# Patient Record
Sex: Female | Born: 1990
Health system: Southern US, Community
[De-identification: ages and names within clinical notes are randomized; demographics above are authoritative.]

## PROBLEM LIST (undated history)

## (undated) DIAGNOSIS — G43909 Migraine, unspecified, not intractable, without status migrainosus: Secondary | ICD-10-CM

## (undated) DIAGNOSIS — R569 Unspecified convulsions: Secondary | ICD-10-CM

## (undated) DIAGNOSIS — F32A Depression, unspecified: Secondary | ICD-10-CM

## (undated) DIAGNOSIS — I10 Essential (primary) hypertension: Secondary | ICD-10-CM

## (undated) DIAGNOSIS — K859 Acute pancreatitis without necrosis or infection, unspecified: Secondary | ICD-10-CM

## (undated) DIAGNOSIS — Z8619 Personal history of other infectious and parasitic diseases: Secondary | ICD-10-CM

## (undated) HISTORY — DX: Personal history of other infectious and parasitic diseases: Z86.19

## (undated) HISTORY — PX: WISDOM TOOTH EXTRACTION: SHX21

## (undated) HISTORY — DX: Migraine, unspecified, not intractable, without status migrainosus: G43.909

---
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
## 2008-10-21 ENCOUNTER — Emergency Department (HOSPITAL_COMMUNITY): Admission: EM | Admit: 2008-10-21 | Discharge: 2008-10-21 | Payer: Self-pay | Admitting: Emergency Medicine

## 2009-02-27 ENCOUNTER — Emergency Department (HOSPITAL_COMMUNITY): Admission: EM | Admit: 2009-02-27 | Discharge: 2009-02-27 | Payer: Self-pay | Admitting: Emergency Medicine

## 2009-03-24 ENCOUNTER — Emergency Department (HOSPITAL_COMMUNITY): Admission: EM | Admit: 2009-03-24 | Discharge: 2009-03-24 | Payer: Self-pay | Admitting: Emergency Medicine

## 2009-09-17 ENCOUNTER — Emergency Department (HOSPITAL_COMMUNITY): Admission: EM | Admit: 2009-09-17 | Discharge: 2009-09-17 | Payer: Self-pay | Admitting: Emergency Medicine

## 2010-05-11 ENCOUNTER — Emergency Department (HOSPITAL_COMMUNITY): Admission: EM | Admit: 2010-05-11 | Discharge: 2010-05-11 | Payer: Self-pay | Admitting: Emergency Medicine

## 2010-10-02 ENCOUNTER — Inpatient Hospital Stay (INDEPENDENT_AMBULATORY_CARE_PROVIDER_SITE_OTHER)
Admission: RE | Admit: 2010-10-02 | Discharge: 2010-10-02 | Disposition: A | Discharge status: Home / Self Care | Payer: Self-pay | Source: Ambulatory Visit | Attending: Family Medicine | Admitting: Family Medicine

## 2010-10-02 DIAGNOSIS — J029 Acute pharyngitis, unspecified: Secondary | ICD-10-CM

## 2010-12-01 LAB — URINALYSIS, ROUTINE W REFLEX MICROSCOPIC
Glucose, UA: NEGATIVE mg/dL
Ketones, ur: NEGATIVE mg/dL
Protein, ur: NEGATIVE mg/dL
pH: 6.5 (ref 5.0–8.0)

## 2010-12-01 LAB — WET PREP, GENITAL
Clue Cells Wet Prep HPF POC: NONE SEEN
Trich, Wet Prep: NONE SEEN
Yeast Wet Prep HPF POC: NONE SEEN

## 2010-12-01 LAB — POCT PREGNANCY, URINE: Preg Test, Ur: NEGATIVE

## 2010-12-01 LAB — URINE MICROSCOPIC-ADD ON

## 2010-12-01 LAB — GC/CHLAMYDIA PROBE AMP, GENITAL
Chlamydia, DNA Probe: POSITIVE — AB
GC Probe Amp, Genital: POSITIVE — AB

## 2010-12-01 LAB — URINE CULTURE: Colony Count: >=100000

## 2010-12-10 LAB — URINALYSIS, ROUTINE W REFLEX MICROSCOPIC
Glucose, UA: NEGATIVE mg/dL
Ketones, ur: NEGATIVE mg/dL
Nitrite: NEGATIVE
Specific Gravity, Urine: 1.021 (ref 1.005–1.030)
pH: 7 (ref 5.0–8.0)

## 2010-12-10 LAB — URINE MICROSCOPIC-ADD ON

## 2011-09-26 ENCOUNTER — Encounter: Payer: Self-pay | Admitting: Obstetrics and Gynecology

## 2011-11-29 ENCOUNTER — Encounter (HOSPITAL_COMMUNITY): Payer: Self-pay | Admitting: *Deleted

## 2011-11-29 ENCOUNTER — Emergency Department (INDEPENDENT_AMBULATORY_CARE_PROVIDER_SITE_OTHER)
Admission: EM | Admit: 2011-11-29 | Discharge: 2011-11-29 | Disposition: A | Discharge status: Home / Self Care | Payer: Self-pay | Source: Home / Self Care | Attending: Family Medicine | Admitting: Family Medicine

## 2011-11-29 DIAGNOSIS — R109 Unspecified abdominal pain: Secondary | ICD-10-CM

## 2011-11-29 LAB — POCT URINALYSIS DIP (DEVICE)
Hgb urine dipstick: NEGATIVE
Ketones, ur: NEGATIVE mg/dL
Protein, ur: NEGATIVE mg/dL
Specific Gravity, Urine: 1.02 (ref 1.005–1.030)
Urobilinogen, UA: 0.2 mg/dL (ref 0.0–1.0)
pH: 7 (ref 5.0–8.0)

## 2011-11-29 MED ORDER — NAPROXEN 500 MG PO TABS: 500.0000 mg | ORAL_TABLET | Freq: Two times a day (BID) | ORAL | Status: DC

## 2011-11-29 MED ORDER — HYDROCODONE-ACETAMINOPHEN 5-325 MG PO TABS: ORAL_TABLET | ORAL | Status: AC

## 2011-11-29 NOTE — ED Provider Notes (Signed)
History     CSN: [id number]  Arrival date & time 11/29/11  1019   First MD Initiated Contact with Patient 11/29/11 1025      Chief Complaint  Patient presents with  . Abdominal Pain    (Consider location/radiation/quality/duration/timing/severity/associated sxs/prior treatment) HPI Comments: Donna Short presents for evaluation for abdominal cramps. She reports getting her annual physical and updated Depo-Provera shot on Wednesday. She was found to have Chlamydia and was given 2 pills, which she does not know what they were. She reports severe lower abdominal cramps and is unable to work. She denies any dysuria, hematuria, vaginal bleeding, or vaginal discharge. She does report mild blood, "pink", with wiping after urinating.  Patient is a 21 y.o. female presenting with cramps. The history is provided by the patient.  Abdominal Cramping The primary symptoms of the illness include abdominal pain. The primary symptoms of the illness do not include fever, fatigue, shortness of breath, nausea, vomiting, diarrhea, hematochezia, dysuria, vaginal discharge or vaginal bleeding. The current episode started 2 days ago. The onset of the illness was sudden. The problem has not changed since onset. The abdominal pain began 2 days ago. The pain came on suddenly. The abdominal pain has been unchanged since its onset. The abdominal pain is located in the suprapubic region. The abdominal pain does not radiate. The abdominal pain is relieved by nothing.  The patient states that she believes she is currently not pregnant. The patient has not had a change in bowel habit.    Past Medical History  Diagnosis Date  . Asthma     History reviewed. No pertinent past surgical history.  History reviewed. No pertinent family history.  History  Substance Use Topics  . Smoking status: Current Everyday Smoker  . Smokeless tobacco: Not on file  . Alcohol Use: Yes    OB History    Grav Para Term Preterm Abortions  TAB SAB Ect Mult Living                  Review of Systems  Constitutional: Negative.  Negative for fever and fatigue.  HENT: Negative.   Eyes: Negative.   Respiratory: Negative.  Negative for shortness of breath.   Cardiovascular: Negative.   Gastrointestinal: Positive for abdominal pain. Negative for nausea, vomiting, diarrhea and hematochezia.  Genitourinary: Negative.  Negative for dysuria, vaginal bleeding and vaginal discharge.  Musculoskeletal: Negative.   Skin: Negative.   Neurological: Negative.     Allergies  Review of patient's allergies indicates no known allergies.  Home Medications   Current Outpatient Rx  Name Route Sig Dispense Refill  . ALBUTEROL SULFATE HFA 108 (90 BASE) MCG/ACT IN AERS Inhalation Inhale 2 puffs into the lungs every 6 (six) hours as needed.    Marland Kitchen MEDROXYPROGESTERONE ACETATE 400 MG/ML IM SUSP Intramuscular Inject into the muscle once.    Marland Kitchen HYDROCODONE-ACETAMINOPHEN 5-325 MG PO TABS  Take one to two tablets every 4 to 6 hours as needed for pain 10 tablet 0  . NAPROXEN 500 MG PO TABS Oral Take 1 tablet (500 mg total) by mouth 2 (two) times daily with a meal. 30 tablet 0    BP 126/83  Pulse 94  Temp(Src) 98.9 F (37.2 C) (Oral)  Resp 20  SpO2 99%  Physical Exam  Nursing note and vitals reviewed. Constitutional: She is oriented to person, place, and time. She appears well-developed and well-nourished.  HENT:  Head: Normocephalic and atraumatic.  Eyes: EOM are normal.  Neck: Normal range of  motion.  Pulmonary/Chest: Effort normal.  Abdominal: Soft. Normal appearance and bowel sounds are normal. There is tenderness in the right lower quadrant, suprapubic area and left lower quadrant. There is no rebound, no guarding, no CVA tenderness, no tenderness at McBurney's point and negative Murphy's sign.  Musculoskeletal: Normal range of motion.  Neurological: She is alert and oriented to person, place, and time.  Skin: Skin is warm and dry.    Psychiatric: Her behavior is normal.    ED Course  Procedures (including critical care time)   Labs Reviewed  POCT URINALYSIS DIP (DEVICE)  POCT PREGNANCY, URINE   No results found.   1. Abdominal cramps       MDM  rx given for naproxen 500 mg BID WC, PRN hydrocodone        Renaee Munda, MD 11/29/11 1206

## 2011-11-29 NOTE — Discharge Instructions (Signed)
Take medications as directed, and as needed. Take the naproxen, with meals, for baseline pain control. Use the hydrocodone ONLY for pain not controlled by naproxen. Return to care should your symptoms not improve, or worsen in any way.

## 2011-11-29 NOTE — ED Notes (Signed)
Per pt seen by health department Wednesday physical pelvic exam - HIV and STD check  -  Positive  chlamydia took the two pills prescribed yesterday - now with abd cramping

## 2012-08-14 ENCOUNTER — Emergency Department (HOSPITAL_COMMUNITY): Payer: No Typology Code available for payment source

## 2012-08-14 ENCOUNTER — Encounter (HOSPITAL_COMMUNITY): Payer: Self-pay | Admitting: Family Medicine

## 2012-08-14 ENCOUNTER — Emergency Department (HOSPITAL_COMMUNITY)
Admission: EM | Admit: 2012-08-14 | Discharge: 2012-08-14 | Disposition: A | Discharge status: Home / Self Care | Payer: No Typology Code available for payment source | Attending: Emergency Medicine | Admitting: Emergency Medicine

## 2012-08-14 DIAGNOSIS — F17200 Nicotine dependence, unspecified, uncomplicated: Secondary | ICD-10-CM | POA: Insufficient documentation

## 2012-08-14 DIAGNOSIS — Y9289 Other specified places as the place of occurrence of the external cause: Secondary | ICD-10-CM | POA: Insufficient documentation

## 2012-08-14 DIAGNOSIS — S0993XA Unspecified injury of face, initial encounter: Secondary | ICD-10-CM | POA: Insufficient documentation

## 2012-08-14 DIAGNOSIS — IMO0002 Reserved for concepts with insufficient information to code with codable children: Secondary | ICD-10-CM | POA: Insufficient documentation

## 2012-08-14 DIAGNOSIS — Z79899 Other long term (current) drug therapy: Secondary | ICD-10-CM | POA: Insufficient documentation

## 2012-08-14 DIAGNOSIS — S161XXA Strain of muscle, fascia and tendon at neck level, initial encounter: Secondary | ICD-10-CM

## 2012-08-14 DIAGNOSIS — J45909 Unspecified asthma, uncomplicated: Secondary | ICD-10-CM | POA: Insufficient documentation

## 2012-08-14 DIAGNOSIS — Y9389 Activity, other specified: Secondary | ICD-10-CM | POA: Insufficient documentation

## 2012-08-14 MED ORDER — HYDROCODONE-ACETAMINOPHEN 5-325 MG PO TABS: 2.0000 | ORAL_TABLET | ORAL | Status: DC | PRN

## 2012-08-14 MED ORDER — IBUPROFEN 600 MG PO TABS: 600.0000 mg | ORAL_TABLET | Freq: Four times a day (QID) | ORAL | Status: DC | PRN

## 2012-08-14 MED ORDER — CYCLOBENZAPRINE HCL 10 MG PO TABS: 10.0000 mg | ORAL_TABLET | Freq: Two times a day (BID) | ORAL | Status: DC | PRN

## 2012-08-14 MED ORDER — HYDROCODONE-ACETAMINOPHEN 5-325 MG PO TABS
2.0000 | ORAL_TABLET | Freq: Once | ORAL | Status: AC
  Administered 2012-08-14: 2 via ORAL
  Filled 2012-08-14 (×2): qty 2
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
## 2012-10-15 ENCOUNTER — Emergency Department (HOSPITAL_COMMUNITY)
Admission: EM | Admit: 2012-10-15 | Discharge: 2012-10-15 | Disposition: A | Discharge status: Home / Self Care | Payer: No Typology Code available for payment source | Attending: Emergency Medicine | Admitting: Emergency Medicine

## 2012-10-15 ENCOUNTER — Encounter (HOSPITAL_COMMUNITY): Payer: Self-pay | Admitting: Emergency Medicine

## 2012-10-15 DIAGNOSIS — Z79899 Other long term (current) drug therapy: Secondary | ICD-10-CM | POA: Insufficient documentation

## 2012-10-15 DIAGNOSIS — S0003XA Contusion of scalp, initial encounter: Secondary | ICD-10-CM | POA: Insufficient documentation

## 2012-10-15 DIAGNOSIS — Y939 Activity, unspecified: Secondary | ICD-10-CM | POA: Insufficient documentation

## 2012-10-15 DIAGNOSIS — T07XXXA Unspecified multiple injuries, initial encounter: Secondary | ICD-10-CM

## 2012-10-15 DIAGNOSIS — W1809XA Striking against other object with subsequent fall, initial encounter: Secondary | ICD-10-CM | POA: Insufficient documentation

## 2012-10-15 DIAGNOSIS — Y92009 Unspecified place in unspecified non-institutional (private) residence as the place of occurrence of the external cause: Secondary | ICD-10-CM | POA: Insufficient documentation

## 2012-10-15 DIAGNOSIS — S00531A Contusion of lip, initial encounter: Secondary | ICD-10-CM

## 2012-10-15 DIAGNOSIS — J45909 Unspecified asthma, uncomplicated: Secondary | ICD-10-CM | POA: Insufficient documentation

## 2012-10-15 DIAGNOSIS — F17200 Nicotine dependence, unspecified, uncomplicated: Secondary | ICD-10-CM | POA: Insufficient documentation

## 2012-10-15 DIAGNOSIS — IMO0002 Reserved for concepts with insufficient information to code with codable children: Secondary | ICD-10-CM | POA: Insufficient documentation

## 2012-10-15 MED ORDER — ACETAMINOPHEN 325 MG PO TABS
650.0000 mg | ORAL_TABLET | Freq: Once | ORAL | Status: AC
  Administered 2012-10-15: 650 mg via ORAL
  Filled 2012-10-15: qty 2

## 2012-10-15 MED ORDER — ACETAMINOPHEN 500 MG PO TABS: 500.0000 mg | ORAL_TABLET | Freq: Four times a day (QID) | ORAL | Status: DC | PRN
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
## 2012-10-15 NOTE — ED Provider Notes (Signed)
Medical screening examination/treatment/procedure(s) were performed by non-physician practitioner and as supervising physician I was immediately available for consultation/collaboration.  Saavi Mceachron M Reshard Guillet, MD 10/15/12 1942 

## 2012-10-15 NOTE — ED Notes (Signed)
Pt discharged.vital signs stable and GCS 15

## 2012-10-15 NOTE — ED Notes (Signed)
Pt presented to ED with fall. As per pt she fell on her face and bruises on her upper lip and lt little finger.

## 2014-09-30 ENCOUNTER — Encounter (HOSPITAL_COMMUNITY): Payer: Self-pay | Admitting: *Deleted

## 2014-09-30 ENCOUNTER — Emergency Department (HOSPITAL_COMMUNITY)
Admission: EM | Admit: 2014-09-30 | Discharge: 2014-09-30 | Disposition: A | Discharge status: Home / Self Care | Payer: Managed Care, Other (non HMO) | Attending: Emergency Medicine | Admitting: Emergency Medicine

## 2014-09-30 DIAGNOSIS — Z79899 Other long term (current) drug therapy: Secondary | ICD-10-CM | POA: Insufficient documentation

## 2014-09-30 DIAGNOSIS — M79674 Pain in right toe(s): Secondary | ICD-10-CM | POA: Diagnosis not present

## 2014-09-30 DIAGNOSIS — J45909 Unspecified asthma, uncomplicated: Secondary | ICD-10-CM | POA: Diagnosis not present

## 2014-09-30 DIAGNOSIS — Z720 Tobacco use: Secondary | ICD-10-CM | POA: Insufficient documentation

## 2014-09-30 MED ORDER — KETOROLAC TROMETHAMINE 60 MG/2ML IM SOLN
60.0000 mg | Freq: Once | INTRAMUSCULAR | Status: AC
  Administered 2014-09-30: 60 mg via INTRAMUSCULAR
  Filled 2014-09-30: qty 2

## 2014-09-30 MED ORDER — NAPROXEN 500 MG PO TABS: 500.0000 mg | ORAL_TABLET | Freq: Two times a day (BID) | ORAL | Status: DC

## 2014-09-30 NOTE — ED Notes (Signed)
The pt uis c/o rt little toe pain for one week.  She works walking

## 2014-09-30 NOTE — Discharge Instructions (Signed)
You were evaluated in the ED today for your toe pain. There does not appear to be an emergent cause for your symptoms at this time. Your likely experiencing an overuse injury. It is important for you to take your medications as prescribed. The anti-inflammatories will help with your discomfort as well as the inflammation that could be contributing to your discomfort. It is also important for you to follow-up with primary care, [facility] and wellness for further evaluation and management of your symptoms. Return to ED for new or worsening symptoms.

## 2014-09-30 NOTE — ED Notes (Signed)
Pt given an ice pack to go home with.

## 2014-09-30 NOTE — ED Provider Notes (Signed)
CSN: [id number]     Arrival date & time 09/30/14  1911 History  This chart was scribed for Donna PeekBenjamin Kayveon Lennartz, PA-C working with No att. providers found by Elveria Risingimelie Horne, ED Scribe. This patient was seen in room TR05C/TR05C and the patient's care was started at 9:06 PM.   Chief Complaint  Patient presents with  . Toe Pain   The history is provided by the patient. No language interpreter was used.   HPI Comments: Donna Short is a 24 y.o. female who presents to the Emergency Department complaining of right fifth toe pain for three months. Patient reports intermittent swelling, discoloration and peeling. Patient reports soaking her foot in Epson salt; she has not taken any pain medication. Patient reports standing for long hours at work without taking breaks.  Patient reports 9/10 currently. Patient is ambulatory, but reports pain with walking. Denies numbness or weakness Patient denies additional medical complaints.  Past Medical History  Diagnosis Date  . Asthma    History reviewed. No pertinent past surgical history. No family history on file. History  Substance Use Topics  . Smoking status: Current Every Day Smoker  . Smokeless tobacco: Not on file  . Alcohol Use: Yes   OB History    No data available     Review of Systems  Constitutional: Negative for fever and chills.  Musculoskeletal: Positive for arthralgias.  Skin: Positive for color change.  Neurological: Negative for weakness and numbness.   Allergies  Shrimp  Home Medications   Prior to Admission medications   Medication Sig Start Date End Date Taking? Authorizing Provider  albuterol (PROVENTIL HFA;VENTOLIN HFA) 108 (90 BASE) MCG/ACT inhaler Inhale 2 puffs into the lungs every 6 (six) hours as needed.   Yes Historical Provider, MD  ibuprofen (ADVIL,MOTRIN) 800 MG tablet Take 800 mg by mouth every 8 (eight) hours as needed for moderate pain.   Yes Historical Provider, MD  medroxyPROGESTERone (DEPO-PROVERA) 150  MG/ML injection Inject 150 mg into the muscle every 3 (three) months. Last taken in August 16, 2014   Yes Historical Provider, MD  acetaminophen (TYLENOL) 500 MG tablet Take 1 tablet (500 mg total) by mouth every 6 (six) hours as needed for pain. Patient not taking: Reported on 09/30/2014 10/15/12   Fayrene HelperBowie Tran, PA-C  naproxen (NAPROSYN) 500 MG tablet Take 1 tablet (500 mg total) by mouth 2 (two) times daily. 09/30/14   Sharlene MottsBenjamin W Kaidin Boehle, PA-C   Triage Vitals: BP 161/120 mmHg  Pulse 131  Temp(Src) 98.8 F (37.1 C) (Oral)  Resp 18  SpO2 100% Physical Exam  Constitutional: She is oriented to person, place, and time. She appears well-developed and well-nourished. No distress.  HENT:  Head: Normocephalic and atraumatic.  Eyes: EOM are normal.  Neck: Neck supple. No tracheal deviation present.  Cardiovascular: Normal heart sounds.   Pulmonary/Chest: Effort normal. No respiratory distress.  Musculoskeletal: Normal range of motion.  Tenderness diffusely to right fifth toe. No overt erythema or warmth. Toe is mildly swollen. Cap refill less than 2 seconds. No evidence of abscess or cellulitis. No subungual hematomas or ecchymosis or other lesions.  Neurological: She is alert and oriented to person, place, and time.  Moves all 4 extremities without ataxia. Sensation intact  Skin: Skin is warm and dry.  No overt discoloration of right fifth toe  Psychiatric: She has a normal mood and affect. Her behavior is normal.  Nursing note and vitals reviewed.   ED Course  Procedures (including critical care time)  COORDINATION OF CARE: 9:16 PM- Discussed treatment plan with patient at bedside and patient agreed to plan.   Labs Review Labs Reviewed - No data to display  Imaging Review No results found.   EKG Interpretation None     Meds given in ED:  Medications  ketorolac (TORADOL) injection 60 mg (60 mg Intramuscular Given 09/30/14 2122)    Discharge Medication List as of 09/30/2014 10:43 PM     START taking these medications   Details  naproxen (NAPROSYN) 500 MG tablet Take 1 tablet (500 mg total) by mouth 2 (two) times daily., Starting 09/30/2014, Until Discontinued, Print       Filed Vitals:   09/30/14 1925 09/30/14 2233  BP: 161/120 144/103  Pulse: 131 105  Temp: 98.8 F (37.1 C) 98 F (36.7 C)  TempSrc: Oral Oral  Resp: 18 16  SpO2: 100% 100%    MDM  Vitals stable -  -afebrile, tachycardia and blood pressure improving in ED spontaneously and with administration of analgesia. Patient reports pain is improved with Toradol administration. Pt resting comfortably in ED. PE--not concerning further acute or emergent pathology  DDX--patient likely suffering from overuse injury as well as poor fitting footwear. Discussed wearing appropriate shoes, using anti-inflammatories, rest, elevation and ice therapy. Referral given to you [facility] and wellness in order to establish care with PCP. Will DC with naproxen I discussed all relevant lab findings and imaging results with pt and they verbalized understanding. Discussed f/u with PCP within 48 hrs and return precautions, pt very amenable to plan.  Final diagnoses:  Toe pain, right    I personally performed the services described in this documentation, which was scribed in my presence. The recorded information has been reviewed and is accurate.    Earle Gell [place], PA-C 10/01/14 0134  Arby Barrette, MD 10/01/14 [phone]

## 2014-11-25 ENCOUNTER — Encounter (HOSPITAL_COMMUNITY): Payer: Self-pay | Admitting: Adult Health

## 2014-11-25 ENCOUNTER — Emergency Department (HOSPITAL_COMMUNITY)
Admission: EM | Admit: 2014-11-25 | Discharge: 2014-11-25 | Disposition: A | Discharge status: Home / Self Care | Payer: Managed Care, Other (non HMO) | Attending: Emergency Medicine | Admitting: Emergency Medicine

## 2014-11-25 DIAGNOSIS — Z792 Long term (current) use of antibiotics: Secondary | ICD-10-CM | POA: Diagnosis not present

## 2014-11-25 DIAGNOSIS — K0889 Other specified disorders of teeth and supporting structures: Secondary | ICD-10-CM

## 2014-11-25 DIAGNOSIS — K029 Dental caries, unspecified: Secondary | ICD-10-CM | POA: Insufficient documentation

## 2014-11-25 DIAGNOSIS — Z720 Tobacco use: Secondary | ICD-10-CM | POA: Diagnosis not present

## 2014-11-25 DIAGNOSIS — Z791 Long term (current) use of non-steroidal anti-inflammatories (NSAID): Secondary | ICD-10-CM | POA: Insufficient documentation

## 2014-11-25 DIAGNOSIS — Z79899 Other long term (current) drug therapy: Secondary | ICD-10-CM | POA: Insufficient documentation

## 2014-11-25 DIAGNOSIS — J45909 Unspecified asthma, uncomplicated: Secondary | ICD-10-CM | POA: Insufficient documentation

## 2014-11-25 DIAGNOSIS — K088 Other specified disorders of teeth and supporting structures: Secondary | ICD-10-CM | POA: Insufficient documentation

## 2014-11-25 MED ORDER — BENZOCAINE 10 % MT GEL: 1.0000 "application " | OROMUCOSAL | Status: DC | PRN

## 2014-11-25 MED ORDER — LIDOCAINE VISCOUS 2 % MT SOLN: 20.0000 mL | OROMUCOSAL | Status: DC | PRN

## 2014-11-25 MED ORDER — CLINDAMYCIN HCL 150 MG PO CAPS: 300.0000 mg | ORAL_CAPSULE | Freq: Three times a day (TID) | ORAL | Status: DC

## 2014-11-25 MED ORDER — MELOXICAM 7.5 MG PO TABS: 15.0000 mg | ORAL_TABLET | Freq: Every day | ORAL | Status: DC

## 2014-11-25 MED ORDER — PENICILLIN V POTASSIUM 500 MG PO TABS: 500.0000 mg | ORAL_TABLET | Freq: Four times a day (QID) | ORAL | Status: DC

## 2014-11-25 MED ORDER — LIDOCAINE VISCOUS 2 % MT SOLN
15.0000 mL | Freq: Once | OROMUCOSAL | Status: AC
  Administered 2014-11-25: 15 mL via OROMUCOSAL
  Filled 2014-11-25: qty 15

## 2014-11-25 NOTE — Discharge Instructions (Signed)
Please follow up with your primary care physician in 1-2 days. If you do not have one please call the [facility] number listed above.Please follow up with Dr. Lucky CowboyKnox, the dentist, to schedule a follow up appointment.  Please take your antibiotic until completion. Please read all discharge instructions and return precautions.    Dental Pain A tooth ache may be caused by cavities (tooth decay). Cavities expose the nerve of the tooth to air and hot or cold temperatures. It may come from an infection or abscess (also called a boil or furuncle) around your tooth. It is also often caused by dental caries (tooth decay). This causes the pain you are having. DIAGNOSIS  Your caregiver can diagnose this problem by exam. TREATMENT   If caused by an infection, it may be treated with medications which kill germs (antibiotics) and pain medications as prescribed by your caregiver. Take medications as directed.  Only take over-the-counter or prescription medicines for pain, discomfort, or fever as directed by your caregiver.  Whether the tooth ache today is caused by infection or dental disease, you should see your dentist as soon as possible for further care. SEEK MEDICAL CARE IF: The exam and treatment you received today has been provided on an emergency basis only. This is not a substitute for complete medical or dental care. If your problem worsens or new problems (symptoms) appear, and you are unable to meet with your dentist, call or return to this location. SEEK IMMEDIATE MEDICAL CARE IF:   You have a fever.  You develop redness and swelling of your face, jaw, or neck.  You are unable to open your mouth.  You have severe pain uncontrolled by pain medicine. MAKE SURE YOU:   Understand these instructions.  Will watch your condition.  Will get help right away if you are not doing well or get worse. Document Released: 08/11/2005 Document Revised: 11/03/2011 Document Reviewed:  03/29/2008 [facility] Patient Information 2015 [employer] [place]. This information is not intended to replace advice given to you by your health care provider. Make sure you discuss any questions you have with your health care provider.

## 2014-11-25 NOTE — ED Notes (Signed)
Pt states she is allergic to penicillin, Candise BowensJen will write for another abx

## 2014-11-25 NOTE — ED Notes (Signed)
Presents with right lower jaw/gum pain began yesterday-pain is rated as 10/10. Pain is severe and prevents her from sleeping. Tried advil with no relief

## 2014-11-25 NOTE — ED Provider Notes (Signed)
CSN: [id number]     Arrival date & time 11/25/14  2129 History   First MD Initiated Contact with Patient 11/25/14 2204     Chief Complaint  Patient presents with  . Dental Pain     (Consider location/radiation/quality/duration/timing/severity/associated sxs/prior Treatment) HPI Comments: Patient is a 24 year old female past medical history significant for tobacco abuse presenting to the emergency department for right lower dental pain with radiation to jaw neck that began yesterday. Patient rates her pain as a 10/10. She tried taking 2 doses of 800mg  Advil last evening with no improvement. Patient has not tried any other medications. She does not have a dentist.   Patient is a 24 y.o. female presenting with tooth pain.  Dental Pain   Past Medical History  Diagnosis Date  . Asthma    History reviewed. No pertinent past surgical history. History reviewed. No pertinent family history. History  Substance Use Topics  . Smoking status: Current Every Day Smoker  . Smokeless tobacco: Not on file  . Alcohol Use: Yes   OB History    No data available     Review of Systems  HENT: Positive for dental problem.   All other systems reviewed and are negative.     Allergies  Shrimp  Home Medications   Prior to Admission medications   Medication Sig Start Date End Date Taking? Authorizing Provider  acetaminophen (TYLENOL) 500 MG tablet Take 1 tablet (500 mg total) by mouth every 6 (six) hours as needed for pain. Patient not taking: Reported on 09/30/2014 10/15/12   Fayrene HelperBowie Tran, PA-C  albuterol (PROVENTIL HFA;VENTOLIN HFA) 108 (90 BASE) MCG/ACT inhaler Inhale 2 puffs into the lungs every 6 (six) hours as needed.    Historical Provider, MD  benzocaine (ORAJEL) 10 % mucosal gel Use as directed 1 application in the mouth or throat as needed for mouth pain. 11/25/14   Kailee Essman, PA-C  clindamycin (CLEOCIN) 150 MG capsule Take 2 capsules (300 mg total) by mouth 3 (three) times daily.  May dispense as 150mg  capsules 11/25/14   Rabiah Goeser, PA-C  ibuprofen (ADVIL,MOTRIN) 800 MG tablet Take 800 mg by mouth every 8 (eight) hours as needed for moderate pain.    Historical Provider, MD  lidocaine (XYLOCAINE) 2 % solution Use as directed 20 mLs in the mouth or throat as needed for mouth pain. 11/25/14   Timmie Dugue, PA-C  medroxyPROGESTERone (DEPO-PROVERA) 150 MG/ML injection Inject 150 mg into the muscle every 3 (three) months. Last taken in August 16, 2014    Historical Provider, MD  meloxicam (MOBIC) 7.5 MG tablet Take 2 tablets (15 mg total) by mouth daily. 11/25/14   Eleanora Guinyard, PA-C  naproxen (NAPROSYN) 500 MG tablet Take 1 tablet (500 mg total) by mouth 2 (two) times daily. 09/30/14   Joycie PeekBenjamin Cartner, PA-C   BP 128/94 mmHg  Pulse 106  Temp(Src) 98.8 F (37.1 C) (Oral)  Resp 18  Ht 5\' 2"  (1.575 m)  Wt 126 lb (57.153 kg)  BMI 23.04 kg/m2  SpO2 98% Physical Exam  Constitutional: She is oriented to person, place, and time. She appears well-developed and well-nourished. No distress.  HENT:  Head: Normocephalic and atraumatic.  Right Ear: External ear normal.  Left Ear: External ear normal.  Nose: Nose normal.  Mouth/Throat: Uvula is midline, oropharynx is clear and moist and mucous membranes are normal. No trismus in the jaw. Abnormal dentition. Dental caries present. No uvula swelling.  Submental and sublingual spaces are soft.  Eyes: Conjunctivae  are normal.  Neck: Normal range of motion. Neck supple.  Cardiovascular: Normal rate, regular rhythm and normal heart sounds.   Pulmonary/Chest: Effort normal and breath sounds normal.  Abdominal: Soft.  Neurological: She is alert and oriented to person, place, and time.  Skin: Skin is warm and dry. She is not diaphoretic.  Psychiatric: She has a normal mood and affect.  Nursing note and vitals reviewed.   ED Course  Procedures (including critical care time) Medications  lidocaine (XYLOCAINE) 2 %  viscous mouth solution 15 mL (15 mLs Mouth/Throat Given 11/25/14 2231)    Labs Review Labs Reviewed - No data to display  Imaging Review No results found.   EKG Interpretation None      MDM   Final diagnoses:  Pain, dental    Filed Vitals:   11/25/14 2229  BP: 128/94  Pulse: 106  Temp: 98.8 F (37.1 C)  Resp: 18   Afebrile, NAD, non-toxic appearing, AAOx4.  Patient with toothache.  No gross abscess.  Exam unconcerning for Ludwig's angina or spread of infection.  Will treat with clindamycin (reported PCN allergy) and pain medicine.  Urged patient to follow-up with dentist.  Patient is stable at time of discharge      Francee Piccolo, PA-C 11/26/14 1610  Doug Sou, MD 11/27/14 [phone]

## 2015-03-13 ENCOUNTER — Other Ambulatory Visit (HOSPITAL_COMMUNITY)
Admission: RE | Admit: 2015-03-13 | Discharge: 2015-03-13 | Disposition: A | Discharge status: Home / Self Care | Payer: Managed Care, Other (non HMO) | Source: Ambulatory Visit | Attending: Emergency Medicine | Admitting: Emergency Medicine

## 2015-03-13 ENCOUNTER — Emergency Department (INDEPENDENT_AMBULATORY_CARE_PROVIDER_SITE_OTHER)
Admission: EM | Admit: 2015-03-13 | Discharge: 2015-03-13 | Disposition: A | Discharge status: Home / Self Care | Payer: Managed Care, Other (non HMO) | Source: Home / Self Care | Attending: Emergency Medicine | Admitting: Emergency Medicine

## 2015-03-13 ENCOUNTER — Encounter (HOSPITAL_COMMUNITY): Payer: Self-pay | Admitting: Emergency Medicine

## 2015-03-13 DIAGNOSIS — N72 Inflammatory disease of cervix uteri: Secondary | ICD-10-CM

## 2015-03-13 DIAGNOSIS — N730 Acute parametritis and pelvic cellulitis: Secondary | ICD-10-CM | POA: Diagnosis not present

## 2015-03-13 DIAGNOSIS — N949 Unspecified condition associated with female genital organs and menstrual cycle: Secondary | ICD-10-CM

## 2015-03-13 DIAGNOSIS — Z113 Encounter for screening for infections with a predominantly sexual mode of transmission: Secondary | ICD-10-CM | POA: Insufficient documentation

## 2015-03-13 DIAGNOSIS — N760 Acute vaginitis: Secondary | ICD-10-CM | POA: Insufficient documentation

## 2015-03-13 DIAGNOSIS — R102 Pelvic and perineal pain: Secondary | ICD-10-CM

## 2015-03-13 DIAGNOSIS — L292 Pruritus vulvae: Secondary | ICD-10-CM

## 2015-03-13 LAB — POCT URINALYSIS DIP (DEVICE)
Bilirubin Urine: NEGATIVE
Glucose, UA: NEGATIVE mg/dL
KETONES UR: NEGATIVE mg/dL
LEUKOCYTES UA: NEGATIVE
Nitrite: NEGATIVE
PH: 7 (ref 5.0–8.0)
Protein, ur: 100 mg/dL — AB
SPECIFIC GRAVITY, URINE: 1.015 (ref 1.005–1.030)
Urobilinogen, UA: 0.2 mg/dL (ref 0.0–1.0)

## 2015-03-13 LAB — POCT PREGNANCY, URINE: Preg Test, Ur: NEGATIVE

## 2015-03-13 MED ORDER — CEFTRIAXONE SODIUM 250 MG IJ SOLR
INTRAMUSCULAR | Status: AC
  Filled 2015-03-13: qty 250

## 2015-03-13 MED ORDER — CEFTRIAXONE SODIUM 250 MG IJ SOLR
250.0000 mg | Freq: Once | INTRAMUSCULAR | Status: AC
Start: 2015-03-13 — End: 2015-03-13
  Administered 2015-03-13: 250 mg via INTRAMUSCULAR

## 2015-03-13 MED ORDER — FLUCONAZOLE 150 MG PO TABS: ORAL_TABLET | ORAL | Status: DC

## 2015-03-13 MED ORDER — AZITHROMYCIN 250 MG PO TABS
1000.0000 mg | ORAL_TABLET | Freq: Every day | ORAL | Status: DC
  Administered 2015-03-13: 1000 mg via ORAL

## 2015-03-13 MED ORDER — AZITHROMYCIN 250 MG PO TABS
ORAL_TABLET | ORAL | Status: AC
  Filled 2015-03-13: qty 4

## 2015-03-13 NOTE — ED Notes (Signed)
Pt comes in with c/o frequent vaginal itch, more at night x 1 week States, she uses baby powder with showers and [employer] fragrances Last intercourse 6/17 Denies odor, discharge or pain  BP elevated- not on medication, denies chest pain Pt crying because mother is not present

## 2015-03-13 NOTE — Discharge Instructions (Signed)
Cervicitis Cervicitis is a soreness and puffiness (inflammation) of the cervix.  HOME CARE  Do not have sex (intercourse) until your doctor says it is okay.  Do not have sex until your partner is treated or as told by your doctor.  Take your antibiotic medicine as told. Finish it even if you start to feel better. GET HELP IF:   Your symptoms that brought you to the doctor come back.  You have a fever. MAKE SURE YOU:   Understand these instructions.  Will watch your condition.  Will get help right away if you are not doing well or get worse. Document Released: 05/20/2008 Document Revised: 08/16/2013 Document Reviewed: 02/02/2013 [facility] Patient Information 2015 [place], [place]. This information is not intended to replace advice given to you by your health care provider. Make sure you discuss any questions you have with your health care provider.  Pelvic Inflammatory Disease Pelvic inflammatory disease (PID) is an infection in some or all of the female organs. PID can be in the uterus, ovaries, fallopian tubes, or the surrounding tissues inside the lower belly area (pelvis). HOME CARE   If given, take your antibiotic medicine as told. Finish them even if you start to feel better.  Only take medicine as told by your doctor.  Do not have sex (intercourse) until treatment is done or as told by your doctor.  Tell your sex partner if you have PID. Your partner may need to be treated.  Keep all doctor visits. GET HELP RIGHT AWAY IF:   You have a fever.  You have more belly (abdominal) or lower belly pain.  You have chills.  You have pain when you pee (urinate).  You are not better after 72 hours.  You have more fluid (discharge) coming from your vagina or fluid that is not normal.  You need pain medicine from your doctor.  You throw up (vomit).  You cannot take your medicines.  Your partner has a sexually transmitted disease (STD). MAKE SURE YOU:   Understand  these instructions.  Will watch your condition.  Will get help right away if you are not doing well or get worse. Document Released: 11/07/2008 Document Revised: 12/06/2012 Document Reviewed: 08/07/2011 [facility] Patient Information 2015 [place], [place]. This information is not intended to replace advice given to you by your health care provider. Make sure you discuss any questions you have with your health care provider.  Pruritus  Pruritus is an itch. There are many different problems that can cause an itch. Dry skin is one of the most common causes of itching. Most cases of itching do not require medical attention.  HOME CARE INSTRUCTIONS  Make sure your skin is moistened on a regular basis. A moisturizer that contains petroleum jelly is best for keeping moisture in your skin. If you develop a rash, you may try the following for relief:   Use corticosteroid cream.  Apply cool compresses to the affected areas.  Bathe with Epsom salts or baking soda in the bathwater.  Soak in colloidal oatmeal baths. These are available at your pharmacy.  Apply baking soda paste to the rash. Stir water into baking soda until it reaches a paste-like consistency.  Use an anti-itch lotion.  Take over-the-counter diphenhydramine medicine by mouth as the instructions direct.  Avoid scratching. Scratching may cause the rash to become infected. If itching is very bad, your caregiver may suggest prescription lotions or creams to lessen your symptoms.  Avoid hot showers, which can make itching worse.  A cold shower may help with itching as long as you use a moisturizer after the shower. SEEK MEDICAL CARE IF: The itching does not go away after several days. Document Released: 04/23/2011 Document Revised: 12/26/2013 Document Reviewed: 04/23/2011 [facility] Patient Information 2015 [place]. This information is not intended to replace advice given to you by your health care provider. Make sure you discuss  any questions you have with your health care provider.

## 2015-03-13 NOTE — ED Provider Notes (Signed)
CSN: [id number]     Arrival date & time 03/13/15  1445 History   First MD Initiated Contact with Patient 03/13/15 1533     Chief Complaint  Patient presents with  . Vaginal Itching   (Consider location/radiation/quality/duration/timing/severity/associated sxs/prior Treatment) HPI Comments: 24 year old female complaining of vaginal itching for 2 weeks. She denies vaginal discharge or odor. Denies pelvic pain. States that she had been using a [employer] fragrances body wash but does not use it to a genitals.   Past Medical History  Diagnosis Date  . Asthma    History reviewed. No pertinent past surgical history. No family history on file. History  Substance Use Topics  . Smoking status: Current Every Day Smoker  . Smokeless tobacco: Not on file  . Alcohol Use: Yes   OB History    No data available     Review of Systems  Constitutional: Negative for fever, activity change and fatigue.  HENT: Negative.   Respiratory: Negative.   Gastrointestinal: Negative.   Genitourinary: Negative for dysuria, urgency, frequency, flank pain, menstrual problem and pelvic pain.  Skin: Negative for rash.    Allergies  Shrimp  Home Medications   Prior to Admission medications   Medication Sig Start Date End Date Taking? Authorizing Provider  acetaminophen (TYLENOL) 500 MG tablet Take 1 tablet (500 mg total) by mouth every 6 (six) hours as needed for pain. Patient not taking: Reported on 09/30/2014 10/15/12   Fayrene Helper, PA-C  albuterol (PROVENTIL HFA;VENTOLIN HFA) 108 (90 BASE) MCG/ACT inhaler Inhale 2 puffs into the lungs every 6 (six) hours as needed.    Historical Provider, MD  benzocaine (ORAJEL) 10 % mucosal gel Use as directed 1 application in the mouth or throat as needed for mouth pain. 11/25/14   Jennifer Piepenbrink, PA-C  clindamycin (CLEOCIN) 150 MG capsule Take 2 capsules (300 mg total) by mouth 3 (three) times daily. May dispense as  capsules 11/25/14   Jennifer Piepenbrink,  PA-C  fluconazole (DIFLUCAN) 150 MG tablet 1 tab po x 1. May repeat in 72 hours if no improvement 03/13/15   Hayden Rasmussen, NP  ibuprofen (ADVIL,MOTRIN) 800 MG tablet Take 800 mg by mouth every 8 (eight) hours as needed for moderate pain.    Historical Provider, MD  lidocaine (XYLOCAINE) 2 % solution Use as directed 20 mLs in the mouth or throat as needed for mouth pain. 11/25/14   Jennifer Piepenbrink, PA-C  medroxyPROGESTERone (DEPO-PROVERA) 150 MG/ML injection Inject 150 mg into the muscle every 3 (three) months. Last taken in August 16, 2014    Historical Provider, MD  meloxicam (MOBIC) 7.5 MG tablet Take 2 tablets (15 mg total) by mouth daily. 11/25/14   Jennifer Piepenbrink, PA-C  naproxen (NAPROSYN) 500 MG tablet Take 1 tablet (500 mg total) by mouth 2 (two) times daily. 09/30/14   Joycie Peek, PA-C   BP 151/111 mmHg  Pulse 123  Temp(Src) 98.7 F (37.1 C) (Oral)  Resp 18  SpO2 100% Physical Exam  Constitutional: She is oriented to person, place, and time. She appears well-developed and well-nourished.  Neck: Normal range of motion. Neck supple.  Pulmonary/Chest: Effort normal.  Genitourinary:  Normal external female genitalia. The patient points to the mucosal aspect of the labia majora as a site of itching. There is no overt erythema there nor are there abnormal lesions. After insertion of the speculum there is scant amount of white discharge. The ectocervix is just right of midline. No lesions are observed. Bimanual: There is marked  severe CMT and bilateral adnexal tenderness. The patient began crying with manipulation of the structures.  Musculoskeletal: She exhibits no edema.  Neurological: She is alert and oriented to person, place, and time.  Skin: Skin is warm and dry.  Psychiatric: She has a normal mood and affect.  Nursing note and vitals reviewed.   ED Course  Procedures (including critical care time) Labs Review Labs Reviewed  POCT URINALYSIS DIP (DEVICE) - Abnormal;  Notable for the following:    Hgb urine dipstick TRACE (*)    Protein, ur 100 (*)    All other components within normal limits  POCT PREGNANCY, URINE  CERVICOVAGINAL ANCILLARY ONLY   Results for orders placed or performed during the hospital encounter of 03/13/15  POCT urinalysis dip (device)  Result Value Ref Range   Glucose, UA NEGATIVE NEGATIVE mg/dL   Bilirubin Urine NEGATIVE NEGATIVE   Ketones, ur NEGATIVE NEGATIVE mg/dL   Specific Gravity, Urine 1.015 1.005 - 1.030   Hgb urine dipstick TRACE (A) NEGATIVE   pH 7.0 5.0 - 8.0   Protein, ur 100 (A) NEGATIVE mg/dL   Urobilinogen, UA 0.2 0.0 - 1.0 mg/dL   Nitrite NEGATIVE NEGATIVE   Leukocytes, UA NEGATIVE NEGATIVE  Pregnancy, urine POC  Result Value Ref Range   Preg Test, Ur NEGATIVE NEGATIVE    Imaging Review No results found.   MDM   1. Vulvar itching   2. Adnexal tenderness   3. Cervicitis   4. PID (acute pelvic inflammatory disease)    Use monistat cream to the external areas of itching for 1 week.  May also add hydrocortisone cream 1% 1-2 times a day to same area along with the monistat cream. This itching may be due to contact dermatitis as a second differential. Diflucan 150 mg one now and repeat in 2 days Rocephin 250 mg IM now Azithromycin 1 g by mouth now Cervical Cytology pending    Hayden Rasmussenavid Crawford Tamura, NP 03/13/15 1625

## 2015-03-14 LAB — CERVICOVAGINAL ANCILLARY ONLY
Chlamydia: NEGATIVE
NEISSERIA GONORRHEA: NEGATIVE

## 2015-03-15 LAB — CERVICOVAGINAL ANCILLARY ONLY: WET PREP (BD AFFIRM): NEGATIVE

## 2015-05-02 ENCOUNTER — Emergency Department (HOSPITAL_COMMUNITY)
Admission: EM | Admit: 2015-05-02 | Discharge: 2015-05-03 | Disposition: A | Discharge status: Home / Self Care | Payer: Managed Care, Other (non HMO) | Attending: Emergency Medicine | Admitting: Emergency Medicine

## 2015-05-02 ENCOUNTER — Encounter (HOSPITAL_COMMUNITY): Payer: Self-pay | Admitting: *Deleted

## 2015-05-02 ENCOUNTER — Emergency Department (HOSPITAL_COMMUNITY): Payer: Managed Care, Other (non HMO)

## 2015-05-02 DIAGNOSIS — Y998 Other external cause status: Secondary | ICD-10-CM | POA: Insufficient documentation

## 2015-05-02 DIAGNOSIS — J45901 Unspecified asthma with (acute) exacerbation: Secondary | ICD-10-CM | POA: Insufficient documentation

## 2015-05-02 DIAGNOSIS — J45909 Unspecified asthma, uncomplicated: Secondary | ICD-10-CM

## 2015-05-02 DIAGNOSIS — S199XXA Unspecified injury of neck, initial encounter: Secondary | ICD-10-CM | POA: Diagnosis present

## 2015-05-02 DIAGNOSIS — Z91040 Latex allergy status: Secondary | ICD-10-CM | POA: Diagnosis not present

## 2015-05-02 DIAGNOSIS — Z79899 Other long term (current) drug therapy: Secondary | ICD-10-CM | POA: Insufficient documentation

## 2015-05-02 DIAGNOSIS — Z720 Tobacco use: Secondary | ICD-10-CM | POA: Insufficient documentation

## 2015-05-02 DIAGNOSIS — Y9389 Activity, other specified: Secondary | ICD-10-CM | POA: Diagnosis not present

## 2015-05-02 DIAGNOSIS — R55 Syncope and collapse: Secondary | ICD-10-CM | POA: Insufficient documentation

## 2015-05-02 DIAGNOSIS — W19XXXA Unspecified fall, initial encounter: Secondary | ICD-10-CM

## 2015-05-02 DIAGNOSIS — W01198A Fall on same level from slipping, tripping and stumbling with subsequent striking against other object, initial encounter: Secondary | ICD-10-CM | POA: Diagnosis not present

## 2015-05-02 DIAGNOSIS — Y9289 Other specified places as the place of occurrence of the external cause: Secondary | ICD-10-CM | POA: Diagnosis not present

## 2015-05-02 DIAGNOSIS — S161XXA Strain of muscle, fascia and tendon at neck level, initial encounter: Secondary | ICD-10-CM | POA: Insufficient documentation

## 2015-05-02 DIAGNOSIS — F410 Panic disorder [episodic paroxysmal anxiety] without agoraphobia: Secondary | ICD-10-CM | POA: Diagnosis not present

## 2015-05-02 LAB — CBC WITH DIFFERENTIAL/PLATELET
BASOS ABS: 0 10*3/uL (ref 0.0–0.1)
BASOS PCT: 1 % (ref 0–1)
Eosinophils Absolute: 0 10*3/uL (ref 0.0–0.7)
Eosinophils Relative: 0 % (ref 0–5)
HEMATOCRIT: 40.6 % (ref 36.0–46.0)
Hemoglobin: 14 g/dL (ref 12.0–15.0)
Lymphocytes Relative: 40 % (ref 12–46)
Lymphs Abs: 2.5 10*3/uL (ref 0.7–4.0)
MCH: 34.8 pg — ABNORMAL HIGH (ref 26.0–34.0)
MCHC: 34.5 g/dL (ref 30.0–36.0)
MCV: 101 fL — ABNORMAL HIGH (ref 78.0–100.0)
MONO ABS: 0.5 10*3/uL (ref 0.1–1.0)
Monocytes Relative: 8 % (ref 3–12)
NEUTROS ABS: 3.2 10*3/uL (ref 1.7–7.7)
Neutrophils Relative %: 51 % (ref 43–77)
Platelets: 172 10*3/uL (ref 150–400)
RBC: 4.02 MIL/uL (ref 3.87–5.11)
RDW: 12.4 % (ref 11.5–15.5)
WBC: 6.1 10*3/uL (ref 4.0–10.5)

## 2015-05-02 LAB — I-STAT BETA HCG BLOOD, ED (MC, WL, AP ONLY): I-stat hCG, quantitative: <5 m[IU]/mL (ref <5)

## 2015-05-02 LAB — BASIC METABOLIC PANEL
ANION GAP: 22 — AB (ref 5–15)
BUN: 8 mg/dL (ref 6–20)
CALCIUM: 9.1 mg/dL (ref 8.9–10.3)
CO2: 18 mmol/L — AB (ref 22–32)
Chloride: 100 mmol/L — ABNORMAL LOW (ref 101–111)
Creatinine, Ser: 0.58 mg/dL (ref 0.44–1.00)
GFR calc non Af Amer: >60 mL/min (ref >60)
Glucose, Bld: 74 mg/dL (ref 65–99)
Potassium: 3.1 mmol/L — ABNORMAL LOW (ref 3.5–5.1)
Sodium: 140 mmol/L (ref 135–145)

## 2015-05-02 NOTE — ED Provider Notes (Signed)
CSN: [id number]     Arrival date & time 05/02/15  2144 History   First MD Initiated Contact with Patient 05/02/15 2202     Chief Complaint  Patient presents with  . Asthma  . Panic Attack  . Fall     (Consider location/radiation/quality/duration/timing/severity/associated sxs/prior Treatment) HPI Comments: Patient is a 24 year old female with a past medical history of asthma who presents with SOB that started prior to arrival. Patient reports having an emotional conversation with someone prior to arrival which seemed to trigger her SOB. She denies any chest pain or any other associated symptoms. No alleviating factors. When patient arrived in the ED, she presented to the desk and had a syncopal episode. She hit the back of her head on the floor and also complains of aching, non-radiating neck pain. Movement makes the pain worse. No alleviating factors. She continues to have SOB. No recent travel/surgery/immobilization, no exogenous estrogen, no previous DVT/PE.    Past Medical History  Diagnosis Date  . Asthma    History reviewed. No pertinent past surgical history. History reviewed. No pertinent family history. Social History  Substance Use Topics  . Smoking status: Current Every Day Smoker  . Smokeless tobacco: None  . Alcohol Use: Yes   OB History    No data available     Review of Systems  Respiratory: Positive for shortness of breath.   Neurological: Positive for syncope.  All other systems reviewed and are negative.     Allergies  Benadryl; Latex; and Shrimp  Home Medications   Prior to Admission medications   Medication Sig Start Date End Date Taking? Authorizing Provider  acetaminophen (TYLENOL) 500 MG tablet Take 1 tablet (500 mg total) by mouth every 6 (six) hours as needed for pain. Patient not taking: Reported on 09/30/2014 10/15/12   Fayrene Helper, PA-C  albuterol (PROVENTIL HFA;VENTOLIN HFA) 108 (90 BASE) MCG/ACT inhaler Inhale 2 puffs into the lungs every 6  (six) hours as needed.    Historical Provider, MD  benzocaine (ORAJEL) 10 % mucosal gel Use as directed 1 application in the mouth or throat as needed for mouth pain. Patient not taking: Reported on 05/02/2015 11/25/14   Francee Piccolo, PA-C  clindamycin (CLEOCIN) 150 MG capsule Take 2 capsules (300 mg total) by mouth 3 (three) times daily. May dispense as 150mg  capsules Patient not taking: Reported on 05/02/2015 11/25/14   Francee Piccolo, PA-C  fluconazole (DIFLUCAN) 150 MG tablet 1 tab po x 1. May repeat in 72 hours if no improvement Patient not taking: Reported on 05/02/2015 03/13/15   Hayden Rasmussen, NP  lidocaine (XYLOCAINE) 2 % solution Use as directed 20 mLs in the mouth or throat as needed for mouth pain. Patient not taking: Reported on 05/02/2015 11/25/14   Francee Piccolo, PA-C  medroxyPROGESTERone (DEPO-PROVERA) 150 MG/ML injection Inject 150 mg into the muscle every 3 (three) months. Last taken in August 16, 2014    Historical Provider, MD  meloxicam (MOBIC) 7.5 MG tablet Take 2 tablets (15 mg total) by mouth daily. Patient not taking: Reported on 05/02/2015 11/25/14   Francee Piccolo, PA-C  naproxen (NAPROSYN) 500 MG tablet Take 1 tablet (500 mg total) by mouth 2 (two) times daily. Patient not taking: Reported on 05/02/2015 09/30/14   Joycie Peek, PA-C   BP 168/120 mmHg  Pulse 127  Temp(Src) 98.8 F (37.1 C) (Oral)  Resp 18  Ht 5\' 2"  (1.575 m)  Wt 120 lb (54.432 kg)  BMI 21.94 kg/m2  SpO2  96% Physical Exam  Constitutional: She is oriented to person, place, and time. She appears well-developed and well-nourished. No distress.  HENT:  Head: Normocephalic and atraumatic.  Occipital scalp tenderness to palpation.   Eyes: Conjunctivae and EOM are normal. Pupils are equal, round, and reactive to light.  Neck:  c-collar in place  Cardiovascular: Regular rhythm.  Exam reveals no gallop and no friction rub.   No murmur heard. Tachycardic. No lower extremity swelling or calf  tenderness to palpation.   Pulmonary/Chest: Effort normal and breath sounds normal. She has no wheezes. She has no rales. She exhibits no tenderness.  Abdominal: Soft. She exhibits no distension. There is no tenderness. There is no rebound.  Musculoskeletal: Normal range of motion.  c-collar in place. No other midline spine tenderness to palpation.   Neurological: She is alert and oriented to person, place, and time. Coordination normal.  Speech is goal-oriented. Moves limbs without ataxia.   Skin: Skin is warm and dry.  Psychiatric: She has a normal mood and affect. Her behavior is normal.  Nursing note and vitals reviewed.   ED Course  Procedures (including critical care time) Labs Review Labs Reviewed  CBC WITH DIFFERENTIAL/PLATELET - Abnormal; Notable for the following:    MCV 101.0 (*)    MCH 34.8 (*)    All other components within normal limits  BASIC METABOLIC PANEL - Abnormal; Notable for the following:    Potassium 3.1 (*)    Chloride 100 (*)    CO2 18 (*)    Anion gap 22 (*)    All other components within normal limits  URINALYSIS, ROUTINE W REFLEX MICROSCOPIC (NOT AT [facility]) - Abnormal; Notable for the following:    APPearance CLOUDY (*)    Hgb urine dipstick SMALL (*)    Ketones, ur 40 (*)    Protein, ur >300 (*)    All other components within normal limits  URINE MICROSCOPIC-ADD ON - Abnormal; Notable for the following:    Squamous Epithelial / LPF FEW (*)    Bacteria, UA MANY (*)    All other components within normal limits  D-DIMER, QUANTITATIVE (NOT AT [facility])  I-STAT BETA HCG BLOOD, ED ([facility], [facility], [facility] ONLY)    Imaging Review Ct Head Wo Contrast  05/03/2015   CLINICAL DATA:  Neck pain after a fall. Difficulty breathing. Posterior head pain.  EXAM: CT HEAD WITHOUT CONTRAST  CT CERVICAL SPINE WITHOUT CONTRAST  TECHNIQUE: Multidetector CT imaging of the head and cervical spine was performed following the standard protocol without intravenous contrast. Multiplanar CT  image reconstructions of the cervical spine were also generated.  COMPARISON:  Cervical spine radiographs 08/14/2012  FINDINGS: CT HEAD FINDINGS  Ventricles and sulci are symmetrical. No mass effect or midline shift. No abnormal extra-axial fluid collections. Gray-white matter junctions are distinct. Basal cisterns are not effaced. No evidence of acute intracranial hemorrhage. No depressed skull fractures. Visualized paranasal sinuses and mastoid air cells are not opacified.  CT CERVICAL SPINE FINDINGS  Straightening of the usual cervical lordosis. This may be due to patient positioning but ligamentous injury or muscle spasm could also have this appearance and are not excluded. No anterior subluxation. Normal alignment of the facet joints. No prevertebral soft tissue swelling. No focal bone lesion or bone destruction. No vertebral compression deformities. Intervertebral disc space heights are preserved. C1-2 articulation appears intact.  IMPRESSION: No acute intracranial abnormalities.  Nonspecific straightening of the usual cervical lordosis. No acute displaced fractures identified.   Electronically Signed  By: Burman Nieves M.D.   On: 05/03/2015 00:17   Ct Cervical Spine Wo Contrast  05/03/2015   CLINICAL DATA:  Neck pain after a fall. Difficulty breathing. Posterior head pain.  EXAM: CT HEAD WITHOUT CONTRAST  CT CERVICAL SPINE WITHOUT CONTRAST  TECHNIQUE: Multidetector CT imaging of the head and cervical spine was performed following the standard protocol without intravenous contrast. Multiplanar CT image reconstructions of the cervical spine were also generated.  COMPARISON:  Cervical spine radiographs 08/14/2012  FINDINGS: CT HEAD FINDINGS  Ventricles and sulci are symmetrical. No mass effect or midline shift. No abnormal extra-axial fluid collections. Gray-white matter junctions are distinct. Basal cisterns are not effaced. No evidence of acute intracranial hemorrhage. No depressed skull fractures.  Visualized paranasal sinuses and mastoid air cells are not opacified.  CT CERVICAL SPINE FINDINGS  Straightening of the usual cervical lordosis. This may be due to patient positioning but ligamentous injury or muscle spasm could also have this appearance and are not excluded. No anterior subluxation. Normal alignment of the facet joints. No prevertebral soft tissue swelling. No focal bone lesion or bone destruction. No vertebral compression deformities. Intervertebral disc space heights are preserved. C1-2 articulation appears intact.  IMPRESSION: No acute intracranial abnormalities.  Nonspecific straightening of the usual cervical lordosis. No acute displaced fractures identified.   Electronically Signed   By: Burman Nieves M.D.   On: 05/03/2015 00:17   Dg Chest Port 1 View  05/02/2015   CLINICAL DATA:  24 year old female with shortness of breath and fall.  EXAM: PORTABLE CHEST - 1 VIEW  COMPARISON:  Radiograph dated 09/17/2009  FINDINGS: The heart size and mediastinal contours are within normal limits. Both lungs are clear. There is dextroscoliosis of the thoracic spine. No acute fracture.  IMPRESSION: No acute cardiopulmonary process.   Electronically Signed   By: Elgie Collard M.D.   On: 05/02/2015 23:24   I have personally reviewed and evaluated these images and lab results as part of my medical decision-making.   EKG Interpretation None      MDM   Final diagnoses:  Anxiety attack  Fall, initial encounter  Cervical strain, acute, initial encounter    11:18 PM Imaging and labs pending. Patient is tachycardic at this time. Remaining vitals stable. Patient not wheezing.   1:13 AM Patient's CT head, cervical spine, and chest xray unremarkable for acute changes. Patient's heart rate has improved since being in the ED. D-dimer pending.   Patient feeling better and heart rate has improved. D-dimer negative. Patient likely having anxiety attack and has improved. Patient will be discharged  with mobic and flexeril for pain.    Emilia Beck, PA-C 05/03/15 2045  Tilden Fossa, MD 05/04/15 [phone]

## 2015-05-02 NOTE — ED Notes (Addendum)
While checking in, pt fell from a standing positon striking head on floor.  Denies neck pain.  Denies LOC.  States she tripped and fell.  While immobilizing pt she was noted to be hyperventilating.  Pt now calm.  Pt is fully immobilized.

## 2015-05-02 NOTE — ED Notes (Addendum)
Pt to ED from home c/o difficulty breathing. Reports having a difficult time breathing; denies taking medications for asthma. Pt also fell in triage, presents with c-collar and LSB from staff; c/o posterior head pain, states "I'm not sure why I fell out up there." A&O x4

## 2015-05-03 LAB — URINALYSIS, ROUTINE W REFLEX MICROSCOPIC
Bilirubin Urine: NEGATIVE
Glucose, UA: NEGATIVE mg/dL
Ketones, ur: 40 mg/dL — AB
LEUKOCYTES UA: NEGATIVE
NITRITE: NEGATIVE
Protein, ur: >300 mg/dL — AB
SPECIFIC GRAVITY, URINE: 1.022 (ref 1.005–1.030)
Urobilinogen, UA: 1 mg/dL (ref 0.0–1.0)
pH: 6 (ref 5.0–8.0)

## 2015-05-03 LAB — D-DIMER, QUANTITATIVE (NOT AT ARMC): D DIMER QUANT: 0.35 ug{FEU}/mL (ref 0.00–0.48)

## 2015-05-03 LAB — URINE MICROSCOPIC-ADD ON

## 2015-05-03 MED ORDER — MELOXICAM 15 MG PO TABS: 15.0000 mg | ORAL_TABLET | Freq: Every day | ORAL | Status: DC

## 2015-05-03 MED ORDER — CYCLOBENZAPRINE HCL 10 MG PO TABS: 10.0000 mg | ORAL_TABLET | Freq: Two times a day (BID) | ORAL | Status: DC | PRN

## 2015-05-03 NOTE — ED Notes (Signed)
Pt stable, ambulatory, states understanding of discharge instructions 

## 2015-05-03 NOTE — Discharge Instructions (Signed)
Take mobic as needed for pain. Take flexeril as needed for muscle spasm. Refer to attached documents for more information.  °

## 2015-10-07 ENCOUNTER — Emergency Department (INDEPENDENT_AMBULATORY_CARE_PROVIDER_SITE_OTHER)
Admission: EM | Admit: 2015-10-07 | Discharge: 2015-10-07 | Disposition: A | Discharge status: Home / Self Care | Payer: Managed Care, Other (non HMO) | Source: Home / Self Care | Attending: Family Medicine | Admitting: Family Medicine

## 2015-10-07 ENCOUNTER — Other Ambulatory Visit (HOSPITAL_COMMUNITY)
Admission: RE | Admit: 2015-10-07 | Discharge: 2015-10-07 | Disposition: A | Discharge status: Home / Self Care | Payer: Managed Care, Other (non HMO) | Source: Ambulatory Visit | Attending: Family Medicine | Admitting: Family Medicine

## 2015-10-07 ENCOUNTER — Encounter (HOSPITAL_COMMUNITY): Payer: Self-pay | Admitting: Emergency Medicine

## 2015-10-07 DIAGNOSIS — R112 Nausea with vomiting, unspecified: Secondary | ICD-10-CM

## 2015-10-07 DIAGNOSIS — R030 Elevated blood-pressure reading, without diagnosis of hypertension: Secondary | ICD-10-CM

## 2015-10-07 DIAGNOSIS — N390 Urinary tract infection, site not specified: Secondary | ICD-10-CM

## 2015-10-07 DIAGNOSIS — IMO0001 Reserved for inherently not codable concepts without codable children: Secondary | ICD-10-CM

## 2015-10-07 LAB — POCT URINALYSIS DIP (DEVICE)
Bilirubin Urine: NEGATIVE
Glucose, UA: NEGATIVE mg/dL
Ketones, ur: 80 mg/dL — AB
LEUKOCYTES UA: NEGATIVE
NITRITE: NEGATIVE
PH: 6.5 (ref 5.0–8.0)
Protein, ur: >=300 mg/dL — AB
Specific Gravity, Urine: >=1.03 (ref 1.005–1.030)
UROBILINOGEN UA: 0.2 mg/dL (ref 0.0–1.0)

## 2015-10-07 LAB — POCT PREGNANCY, URINE: PREG TEST UR: NEGATIVE

## 2015-10-07 MED ORDER — ONDANSETRON HCL 4 MG PO TABS: 4.0000 mg | ORAL_TABLET | Freq: Three times a day (TID) | ORAL | Status: DC | PRN

## 2015-10-07 MED ORDER — NITROFURANTOIN MONOHYD MACRO 100 MG PO CAPS: 100.0000 mg | ORAL_CAPSULE | Freq: Two times a day (BID) | ORAL | Status: DC

## 2015-10-07 NOTE — ED Notes (Signed)
Pt here with sudden nausea, vomiting and diarrhea that started yesterday States , her power in the house went out for 5 hours when she ate fridge salad and chicken Tachycardic with elevated BP 164/124 Denies chest pain or sob Urine sample obtained

## 2015-10-07 NOTE — Discharge Instructions (Signed)
Urinary Tract Infection A urinary tract infection (UTI) can occur any place along the urinary tract. The tract includes the kidneys, ureters, bladder, and urethra. A type of germ called bacteria often causes a UTI. UTIs are often helped with antibiotic medicine.  HOME CARE   If given, take antibiotics as told by your doctor. Finish them even if you start to feel better.  Drink enough fluids to keep your pee (urine) clear or pale yellow.  Avoid tea, drinks with caffeine, and bubbly (carbonated) drinks.  Pee often. Avoid holding your pee in for a long time.  Pee before and after having sex (intercourse).  Wipe from front to back after you poop (bowel movement) if you are a woman. Use each tissue only once. GET HELP RIGHT AWAY IF:   You have back pain.  You have lower belly (abdominal) pain.  You have chills.  You feel sick to your stomach (nauseous).  You throw up (vomit).  Your burning or discomfort with peeing does not go away.  You have a fever.  Your symptoms are not better in 3 days. MAKE SURE YOU:   Understand these instructions.  Will watch your condition.  Will get help right away if you are not doing well or get worse.   This information is not intended to replace advice given to you by your health care provider. Make sure you discuss any questions you have with your health care provider.   You have a urinary tract infection. Please take all of your antibiotics. If you worsen please f/u her or in the ER. Your blood pressure is a little hight today. Please f/u with your primary care for this. Push fluids, watch salt and alcohol intake.    Document Released: 01/28/2008 Document Revised: 09/01/2014 Document Reviewed: 03/11/2012 Elsevier Interactive Patient Education [employer]

## 2015-10-07 NOTE — ED Provider Notes (Signed)
CSN: [id number]     Arrival date & time 10/07/15  1902 History   First MD Initiated Contact with Patient 10/07/15 1944     Chief Complaint  Patient presents with  . GI Problem  . Nausea   (Consider location/radiation/quality/duration/timing/severity/associated sxs/prior Treatment) HPI Comments: Patient is 25 yo with lower abdominal discomfort. Started yesterday with nausea and vomiting. Some diarrhea today. No fever or chills.  She has no urinary burning or flank pain. No vaginal discharge. She admits to drinking today. No dyspnea or chest pain.   Patient is a 25 y.o. female presenting with GI illness. The history is provided by the patient.  GI Problem    Past Medical History  Diagnosis Date  . Asthma    History reviewed. No pertinent past surgical history. No family history on file. Social History  Substance Use Topics  . Smoking status: Current Every Day Smoker  . Smokeless tobacco: None  . Alcohol Use: Yes   OB History    No data available     Review of Systems  All other systems reviewed and are negative.   Allergies  Benadryl; Latex; and Shrimp  Home Medications   Prior to Admission medications   Medication Sig Start Date End Date Taking? Authorizing Provider  acetaminophen (TYLENOL) 500 MG tablet Take 1 tablet (500 mg total) by mouth every 6 (six) hours as needed for pain. Patient not taking: Reported on 09/30/2014 10/15/12   Fayrene Helper, PA-C  albuterol (PROVENTIL HFA;VENTOLIN HFA) 108 (90 BASE) MCG/ACT inhaler Inhale 2 puffs into the lungs every 6 (six) hours as needed.    Historical Provider, MD  benzocaine (ORAJEL) 10 % mucosal gel Use as directed 1 application in the mouth or throat as needed for mouth pain. Patient not taking: Reported on 05/02/2015 11/25/14   Francee Piccolo, PA-C  clindamycin (CLEOCIN) 150 MG capsule Take 2 capsules (300 mg total) by mouth 3 (three) times daily. May dispense as  capsules Patient not taking: Reported on 05/02/2015 11/25/14    Francee Piccolo, PA-C  cyclobenzaprine (FLEXERIL) 10 MG tablet Take 1 tablet (10 mg total) by mouth 2 (two) times daily as needed for muscle spasms. 05/03/15   Kaitlyn Szekalski, PA-C  fluconazole (DIFLUCAN) 150 MG tablet 1 tab po x 1. May repeat in 72 hours if no improvement Patient not taking: Reported on 05/02/2015 03/13/15   Hayden Rasmussen, NP  lidocaine (XYLOCAINE) 2 % solution Use as directed 20 mLs in the mouth or throat as needed for mouth pain. Patient not taking: Reported on 05/02/2015 11/25/14   Francee Piccolo, PA-C  medroxyPROGESTERone (DEPO-PROVERA) 150 MG/ML injection Inject 150 mg into the muscle every 3 (three) months. Last taken in August 16, 2014    Historical Provider, MD  meloxicam (MOBIC) 15 MG tablet Take 1 tablet (15 mg total) by mouth daily. 05/03/15   Kaitlyn Szekalski, PA-C  naproxen (NAPROSYN) 500 MG tablet Take 1 tablet (500 mg total) by mouth 2 (two) times daily. Patient not taking: Reported on 05/02/2015 09/30/14   Joycie Peek, PA-C  nitrofurantoin, macrocrystal-monohydrate, (MACROBID) 100 MG capsule Take 1 capsule (100 mg total) by mouth 2 (two) times daily. 10/07/15   Riki Sheer, PA-C  ondansetron (ZOFRAN) 4 MG tablet Take 1 tablet (4 mg total) by mouth every 8 (eight) hours as needed for nausea or vomiting. 10/07/15   Riki Sheer, PA-C   Meds Ordered and Administered this Visit  Medications - No data to display  BP 164/124 mmHg  Pulse  129  Temp(Src) 99.3 F (37.4 C) (Oral)  Resp 20  SpO2 99%  LMP 08/08/2015 No data found.   Physical Exam  Constitutional: She is oriented to person, place, and time. She appears well-developed and well-nourished. No distress.  Appears anxious, smells of alcohol.   Cardiovascular: Regular rhythm.   Tachy rate, normal rhythm  Pulmonary/Chest: Effort normal and breath sounds normal. No respiratory distress.  Neurological: She is alert and oriented to person, place, and time.  Skin: Skin is warm and dry. No rash  noted. She is not diaphoretic.  Psychiatric: Her behavior is normal.  Nursing note and vitals reviewed.   ED Course  Procedures (including critical care time)  Labs Review Labs Reviewed  POCT URINALYSIS DIP (DEVICE) - Abnormal; Notable for the following:    Ketones, ur 80 (*)    Hgb urine dipstick MODERATE (*)    Protein, ur >=300 (*)    All other components within normal limits  URINE CULTURE  POCT PREGNANCY, URINE    Imaging Review No results found.   Visual Acuity Review  Right Eye Distance:   Left Eye Distance:   Bilateral Distance:    Right Eye Near:   Left Eye Near:    Bilateral Near:         MDM   1. UTI (lower urinary tract infection)   2. Nausea and vomiting, vomiting of unspecified type   3. Elevated blood pressure    Treat with Macrobid, fluids and rest. F/U if worsen.  3. No history of HTN, encouraged patient and father to have her f/u with a PCP. Asymptomatic. Alcohol on board during the visit.     Riki Sheer, PA-C 10/07/15 2033

## 2015-10-09 LAB — URINE CULTURE

## 2015-12-07 ENCOUNTER — Encounter (HOSPITAL_COMMUNITY): Payer: Self-pay

## 2015-12-07 ENCOUNTER — Emergency Department (HOSPITAL_COMMUNITY): Payer: Managed Care, Other (non HMO)

## 2015-12-07 ENCOUNTER — Emergency Department (HOSPITAL_COMMUNITY)
Admission: EM | Admit: 2015-12-07 | Discharge: 2015-12-08 | Disposition: A | Discharge status: Home / Self Care | Payer: Managed Care, Other (non HMO) | Attending: Emergency Medicine | Admitting: Emergency Medicine

## 2015-12-07 DIAGNOSIS — F17200 Nicotine dependence, unspecified, uncomplicated: Secondary | ICD-10-CM | POA: Diagnosis not present

## 2015-12-07 DIAGNOSIS — J45909 Unspecified asthma, uncomplicated: Secondary | ICD-10-CM | POA: Insufficient documentation

## 2015-12-07 DIAGNOSIS — Y9289 Other specified places as the place of occurrence of the external cause: Secondary | ICD-10-CM | POA: Insufficient documentation

## 2015-12-07 DIAGNOSIS — R569 Unspecified convulsions: Secondary | ICD-10-CM | POA: Insufficient documentation

## 2015-12-07 DIAGNOSIS — Y998 Other external cause status: Secondary | ICD-10-CM | POA: Diagnosis not present

## 2015-12-07 DIAGNOSIS — S0003XA Contusion of scalp, initial encounter: Secondary | ICD-10-CM | POA: Insufficient documentation

## 2015-12-07 DIAGNOSIS — Z3202 Encounter for pregnancy test, result negative: Secondary | ICD-10-CM | POA: Insufficient documentation

## 2015-12-07 DIAGNOSIS — Z79899 Other long term (current) drug therapy: Secondary | ICD-10-CM | POA: Insufficient documentation

## 2015-12-07 DIAGNOSIS — W01198A Fall on same level from slipping, tripping and stumbling with subsequent striking against other object, initial encounter: Secondary | ICD-10-CM | POA: Diagnosis not present

## 2015-12-07 DIAGNOSIS — S0990XA Unspecified injury of head, initial encounter: Secondary | ICD-10-CM | POA: Insufficient documentation

## 2015-12-07 DIAGNOSIS — Y9389 Activity, other specified: Secondary | ICD-10-CM | POA: Diagnosis not present

## 2015-12-07 DIAGNOSIS — Z91040 Latex allergy status: Secondary | ICD-10-CM | POA: Diagnosis not present

## 2015-12-07 HISTORY — DX: Unspecified convulsions: R56.9

## 2015-12-07 LAB — CBC WITH DIFFERENTIAL/PLATELET
BASOS PCT: 1 %
Basophils Absolute: 0 10*3/uL (ref 0.0–0.1)
EOS ABS: 0 10*3/uL (ref 0.0–0.7)
EOS PCT: 0 %
HEMATOCRIT: 35.3 % — AB (ref 36.0–46.0)
Hemoglobin: 11.8 g/dL — ABNORMAL LOW (ref 12.0–15.0)
Lymphocytes Relative: 18 %
Lymphs Abs: 1.2 10*3/uL (ref 0.7–4.0)
MCH: 34.4 pg — ABNORMAL HIGH (ref 26.0–34.0)
MCHC: 33.4 g/dL (ref 30.0–36.0)
MCV: 102.9 fL — ABNORMAL HIGH (ref 78.0–100.0)
MONO ABS: 0.7 10*3/uL (ref 0.1–1.0)
MONOS PCT: 10 %
Neutro Abs: 4.5 10*3/uL (ref 1.7–7.7)
Neutrophils Relative %: 71 %
PLATELETS: 164 10*3/uL (ref 150–400)
RBC: 3.43 MIL/uL — ABNORMAL LOW (ref 3.87–5.11)
RDW: 13 % (ref 11.5–15.5)
WBC: 6.4 10*3/uL (ref 4.0–10.5)

## 2015-12-07 LAB — BASIC METABOLIC PANEL
Anion gap: 19 — ABNORMAL HIGH (ref 5–15)
BUN: 12 mg/dL (ref 6–20)
CALCIUM: 9.5 mg/dL (ref 8.9–10.3)
CO2: 20 mmol/L — AB (ref 22–32)
CREATININE: 0.49 mg/dL (ref 0.44–1.00)
Chloride: 99 mmol/L — ABNORMAL LOW (ref 101–111)
Glucose, Bld: 88 mg/dL (ref 65–99)
Potassium: 3.9 mmol/L (ref 3.5–5.1)
SODIUM: 138 mmol/L (ref 135–145)

## 2015-12-07 LAB — MAGNESIUM: Magnesium: 1.1 mg/dL — ABNORMAL LOW (ref 1.7–2.4)

## 2015-12-07 LAB — I-STAT BETA HCG BLOOD, ED (MC, WL, AP ONLY)

## 2015-12-07 MED ORDER — SODIUM CHLORIDE 0.9 % IV BOLUS (SEPSIS)
1000.0000 mL | Freq: Once | INTRAVENOUS | Status: AC
  Administered 2015-12-07: 1000 mL via INTRAVENOUS

## 2015-12-07 MED ORDER — SODIUM CHLORIDE 0.9 % IV BOLUS (SEPSIS)
1000.0000 mL | Freq: Once | INTRAVENOUS | Status: AC
  Administered 2015-12-08: 1000 mL via INTRAVENOUS

## 2015-12-07 MED ORDER — MAGNESIUM SULFATE 2 GM/50ML IV SOLN
2.0000 g | Freq: Once | INTRAVENOUS | Status: AC
  Administered 2015-12-08: 2 g via INTRAVENOUS
  Filled 2015-12-07: qty 50

## 2015-12-07 MED ORDER — MAGNESIUM SULFATE 2 GM/50ML IV SOLN: 2.0000 g | Freq: Once | INTRAVENOUS | Status: DC

## 2015-12-07 MED ORDER — SODIUM CHLORIDE 0.9 % IV SOLN
1000.0000 mg | Freq: Once | INTRAVENOUS | Status: AC
  Administered 2015-12-08: 1000 mg via INTRAVENOUS
  Filled 2015-12-07: qty 10

## 2015-12-07 NOTE — ED Notes (Signed)
Pt escorted to restroom by this RN and pt's mother.

## 2015-12-07 NOTE — ED Notes (Signed)
Pt's mother in bathroom calling for help. This RN in bathroom with patient, pt actively seizing, assisted into wheelchair by this RN another RN and an EMT. By the time pt escorted back into the bed pt postictal and coming back around.

## 2015-12-07 NOTE — ED Notes (Signed)
Dr Rhunette CroftNanavati in Room

## 2015-12-07 NOTE — ED Provider Notes (Signed)
CSN: [id number]     Arrival date & time 12/07/15  1720 History   First MD Initiated Contact with Patient 12/07/15 1721     Chief Complaint  Patient presents with  . Seizures     (Consider location/radiation/quality/duration/timing/severity/associated sxs/prior Treatment) Patient is a 25 y.o. female presenting with seizures.  Seizures Seizure activity on arrival: no   Seizure type:  Grand mal Preceding symptoms: no sensation of an aura present, no dizziness and no nausea   Initial focality:  Diffuse Episode characteristics: tongue biting and unresponsiveness   Postictal symptoms: somnolence   Return to baseline: yes   Severity:  Moderate Timing:  Clustered Number of seizures this episode:  3 Progression:  Resolved Context: not drug use, not possible hypoglycemia and not possible medication ingestion   Recent head injury:  Over 24 hours ago PTA treatment:  None History of seizures: no     Past Medical History  Diagnosis Date  . Asthma   . Seizures (HCC)    History reviewed. No pertinent past surgical history. History reviewed. No pertinent family history. Social History  Substance Use Topics  . Smoking status: Current Every Day Smoker  . Smokeless tobacco: None  . Alcohol Use: Yes   OB History    No data available     Review of Systems  Constitutional: Negative for fever and chills.  HENT: Negative for congestion, rhinorrhea and sore throat.   Eyes: Negative for photophobia and visual disturbance.  Respiratory: Negative for cough, shortness of breath and wheezing.   Cardiovascular: Negative for chest pain and leg swelling.  Gastrointestinal: Negative for nausea, abdominal pain and diarrhea.  Genitourinary: Negative for dysuria and flank pain.  Musculoskeletal: Negative for neck pain and neck stiffness.  Skin: Negative for rash.  Neurological: Positive for seizures and headaches. Negative for syncope and weakness.      Allergies  Benadryl; Latex; and  Shrimp  Home Medications   Prior to Admission medications   Medication Sig Start Date End Date Taking? Authorizing Provider  albuterol (PROVENTIL HFA;VENTOLIN HFA) 108 (90 BASE) MCG/ACT inhaler Inhale 2 puffs into the lungs every 6 (six) hours as needed for shortness of breath. Reported on 12/07/2015   Yes Historical Provider, MD  medroxyPROGESTERone (DEPO-PROVERA) 150 MG/ML injection Inject 150 mg into the muscle every 3 (three) months. Last taken in August 16, 2014   Yes Historical Provider, MD  acetaminophen (TYLENOL) 500 MG tablet Take 1 tablet (500 mg total) by mouth every 6 (six) hours as needed for pain. Patient not taking: Reported on 09/30/2014 10/15/12   Fayrene Helper, PA-C  benzocaine (ORAJEL) 10 % mucosal gel Use as directed 1 application in the mouth or throat as needed for mouth pain. Patient not taking: Reported on 05/02/2015 11/25/14   Francee Piccolo, PA-C  clindamycin (CLEOCIN) 150 MG capsule Take 2 capsules (300 mg total) by mouth 3 (three) times daily. May dispense as  capsules Patient not taking: Reported on 05/02/2015 11/25/14   Francee Piccolo, PA-C  cyclobenzaprine (FLEXERIL) 10 MG tablet Take 1 tablet (10 mg total) by mouth 2 (two) times daily as needed for muscle spasms. Patient not taking: Reported on 12/07/2015 05/03/15   Emilia Beck, PA-C  fluconazole (DIFLUCAN) 150 MG tablet 1 tab po x 1. May repeat in 72 hours if no improvement Patient not taking: Reported on 05/02/2015 03/13/15   Hayden Rasmussen, NP  lidocaine (XYLOCAINE) 2 % solution Use as directed 20 mLs in the mouth or throat as needed for mouth pain.  Patient not taking: Reported on 05/02/2015 11/25/14   Francee Piccolo, PA-C  meloxicam (MOBIC) 15 MG tablet Take 1 tablet (15 mg total) by mouth daily. Patient not taking: Reported on 12/07/2015 05/03/15   Emilia Beck, PA-C  naproxen (NAPROSYN) 500 MG tablet Take 1 tablet (500 mg total) by mouth 2 (two) times daily. Patient not taking: Reported on 05/02/2015  09/30/14   Joycie Peek, PA-C  nitrofurantoin, macrocrystal-monohydrate, (MACROBID) 100 MG capsule Take 1 capsule (100 mg total) by mouth 2 (two) times daily. Patient not taking: Reported on 12/07/2015 10/07/15   Riki Sheer, PA-C  ondansetron (ZOFRAN) 4 MG tablet Take 1 tablet (4 mg total) by mouth every 8 (eight) hours as needed for nausea or vomiting. Patient not taking: Reported on 12/07/2015 10/07/15   Dillard Cannon Young, PA-C   BP 130/99 mmHg  Pulse 106  Temp(Src) 99.2 F (37.3 C) (Oral)  Resp 21  SpO2 100% Physical Exam  Constitutional: She is oriented to person, place, and time. She appears well-developed and well-nourished. No distress.  HENT:  Head: Normocephalic and atraumatic.  Mouth/Throat: No oropharyngeal exudate.  Eyes: Conjunctivae are normal. Pupils are equal, round, and reactive to light. Right eye exhibits no discharge. Left eye exhibits no discharge.  Neck: Normal range of motion. Neck supple.  Cardiovascular: Normal rate, regular rhythm, normal heart sounds and intact distal pulses.  Exam reveals no friction rub.   No murmur heard. Pulmonary/Chest: Effort normal and breath sounds normal. No respiratory distress. She has no wheezes.  Abdominal: Soft. Bowel sounds are normal. She exhibits no distension. There is no tenderness.  Musculoskeletal: She exhibits no edema.  Neurological: She is alert and oriented to person, place, and time. She displays normal reflexes. No cranial nerve deficit. She exhibits normal muscle tone.  Skin: Skin is warm. No rash noted.  Nursing note and vitals reviewed.   Neurological Exam:  - Mental Status: Alert and oriented to person, place, and time. Attention and concentration normal. Speech clear. Recent memory is intact. - Cranial Nerves: Visual fields intact to confrontation in all quadrants bilaterally. EOMI and PERRLA. No nystagmus noted. Facial sensation intact at forehead, maxillary cheek, and chin/mandible bilaterally. No weakness  of masticatory muscles. No facial asymmetry or weakness. Hearing grossly normal to finer rub. Uvula is midline, and palate elevates symmetrically. Normal SCM and trapezius strength. Tongue midline without fasciculations - Motor: Muscle strength 5/5 in proximal and distal UE and LE bilaterally. No pronator drift. Muscle tone normal. - Reflexes: 2+ and symmetrical in all four extremities.  - Sensation: Intact to light touch and pinprick in upper and lower extremities distally bilaterally.  - Gait: Normal without ataxia. - Coordination: Normal FTN and HTS bilaterally.    ED Course  Procedures (including critical care time) Labs Review Labs Reviewed  CBC WITH DIFFERENTIAL/PLATELET - Abnormal; Notable for the following:    RBC 3.43 (*)    Hemoglobin 11.8 (*)    HCT 35.3 (*)    MCV 102.9 (*)    MCH 34.4 (*)    All other components within normal limits  BASIC METABOLIC PANEL - Abnormal; Notable for the following:    Chloride 99 (*)    CO2 20 (*)    Anion gap 19 (*)    All other components within normal limits  MAGNESIUM - Abnormal; Notable for the following:    Magnesium 1.1 (*)    All other components within normal limits  I-STAT BETA HCG BLOOD, ED ([facility], [facility], [facility] ONLY)  Imaging Review Ct Head Wo Contrast  12/07/2015  CLINICAL DATA:  New onset seizures, fall. EXAM: CT HEAD WITHOUT CONTRAST TECHNIQUE: Contiguous axial images were obtained from the base of the skull through the vertex without intravenous contrast. COMPARISON:  05/03/2015. FINDINGS: Brain: No evidence of an acute infarct, acute hemorrhage, mass lesion, mass effect or hydrocephalus. Vascular: No hyperdense vessel or unexpected calcification. Skull: Negative for fracture or focal lesion. Sinuses/Orbits: No acute findings. Other: Scalp hematoma overlies the vertex. IMPRESSION: Scalp hematoma without acute intracranial abnormality. Electronically Signed   By: Leanna BattlesMelinda  Blietz M.D.   On: 12/07/2015 17:59   I have personally  reviewed and evaluated these images and lab results as part of my medical decision-making.   EKG Interpretation None      MDM   25 year old female with past medical history of mild asthma who presents with 2 reportedly generalized tonic-clonic seizures. Patient has no history of seizures. She bumped her head several days ago but otherwise denies any significant head trauma. She is not on blood thinners. She is not on OCPs and denies any history of blood clots. Denies any visual changes or unilateral numbness weakness or other neurological symptoms at this time beyond a mild headache. She has since returned to her baseline per report. On arrival the patient is tachycardic and anxious appearing but otherwise human dynamically stable times nonfocal as above. Given first time seizure with possible h/o recent trauma, will obtain CT Head, labs, and monitor. Will consult Neurology.  While awaiting CT, patietn had brief, <5 min GTC seizure. She is now back to baseline. Will monitor.  CT head shows soft tissue contusion but is otherwise unremarkable. BMP with hypomagnesemia, otherwise mild acidosis likely 2/2 seizure and dehydration. CBC unremarkable. Urine pregnancy is negative. Discussed case with Dr. Roseanne RenoStewart, Neurology. He does not feel pt requires further work-up or admission at this time if she has returned to bedside. He recommends keppra load and 500 mg BID with outpatient follow-up. The patient is otherwise back to her baseline. She denies any persistent headache. No apparent trigger for her seizures identified on labs and imaging. She denies any headache photophobia fever or signs of meningitis or encephalitis. Her EKG shows no evidence of long QT, WPW, HOCM or other evidence to suggest primary arrhythmia or cardiac component. Discussed risks, benefits of admission for observation versus discharge with the patient. Patient is alert, competent, and able to voice understanding. At this time, she declines  admission. She understands the risks and benefits of doing so. I discussed with the patient's mother as well, who recommended admission to the patient with the patient remains adamant that she will not be admitted. Will give prescription for keppra, advise close Neurology follow-up, and strict return precautions. Mother will stay with pt tonight. Seizure precautiosn given including no driving.  Clinical Impression: 1. First time seizure (HCC)   2. Hypomagnesemia   3. Scalp hematoma, initial encounter     Disposition: Discharge  Condition: Good  I have discussed the results, Dx and Tx plan with the pt(& family if present). He/she/they expressed understanding and agree(s) with the plan. Discharge instructions discussed at great length. Strict return precautions discussed and pt &/or family have verbalized understanding of the instructions. No further questions at time of discharge.    New Prescriptions   LEVETIRACETAM (KEPPRA) 500 MG TABLET    Take 1 tablet (500 mg total) by mouth 2 (two) times daily.    Follow Up: [facility] [address] [place] [address] [phone]  Follow-up ASAP at next available appointment for new seizure work-up  [facility] [address]     [address] [place] [address] [phone]  Follow-up ASAP at next available appointment for new seizure work-up   Pt seen in conjunction with Dr. Collie Siad, MD 12/08/15 [id number]  Derwood Kaplan, MD 12/08/15 9528

## 2015-12-07 NOTE — ED Notes (Signed)
Per EMS, Pt from work, had 2 unwitnessed seizures. Footage from security cameras revealed seizures and length. No incontinence, minimal oral trauma by way of small dot to tip of tongue. No hx of seizures. Pt states that she had a fall last Monday hitting her head on a tile floor but was not seen. Pt hit her head in the same area again today. Pt complains of pain to parietal area, minimal swelling to area. VS CBG 81, HR 122 sinus, 168/120, 95% on RA. Pt alert and oriented x 4. Pt postictal for EMS for 1 minute.

## 2015-12-07 NOTE — ED Notes (Signed)
Pt assisted to bedside commode by this RN  

## 2015-12-07 NOTE — ED Notes (Signed)
Pt unable to void at this time. 

## 2015-12-08 MED ORDER — MAGNESIUM OXIDE 400 (241.3 MG) MG PO TABS
400.0000 mg | ORAL_TABLET | Freq: Once | ORAL | Status: AC
  Administered 2015-12-08: 400 mg via ORAL
  Filled 2015-12-08: qty 1

## 2015-12-08 MED ORDER — LEVETIRACETAM 500 MG PO TABS: 500.0000 mg | ORAL_TABLET | Freq: Two times a day (BID) | ORAL | Status: DC

## 2015-12-08 MED ORDER — MAGNESIUM OXIDE 400 (241.3 MG) MG PO TABS: 400.0000 mg | ORAL_TABLET | Freq: Once | ORAL | Status: DC

## 2015-12-08 NOTE — ED Notes (Signed)
Pt verbalized understanding of d/c instructions and has no further questions. Pt stable and NAD. Pt alert and oriented x 4.

## 2015-12-10 ENCOUNTER — Telehealth (HOSPITAL_BASED_OUTPATIENT_CLINIC_OR_DEPARTMENT_OTHER): Payer: Self-pay | Admitting: Emergency Medicine

## 2015-12-12 ENCOUNTER — Telehealth: Payer: Self-pay | Admitting: Neurology

## 2015-12-12 ENCOUNTER — Ambulatory Visit: Payer: Managed Care, Other (non HMO) | Admitting: Neurology

## 2015-12-12 NOTE — Telephone Encounter (Signed)
[employer] called to inform provider she will be sending a form to be filled out for short term disability.

## 2015-12-18 ENCOUNTER — Telehealth: Payer: Self-pay | Admitting: *Deleted

## 2015-12-18 NOTE — Telephone Encounter (Signed)
Patient form is on [employer] desk.

## 2015-12-19 ENCOUNTER — Telehealth: Payer: Self-pay | Admitting: Neurology

## 2015-12-19 ENCOUNTER — Ambulatory Visit (INDEPENDENT_AMBULATORY_CARE_PROVIDER_SITE_OTHER): Payer: Managed Care, Other (non HMO) | Admitting: Neurology

## 2015-12-19 ENCOUNTER — Encounter: Payer: Self-pay | Admitting: *Deleted

## 2015-12-19 ENCOUNTER — Encounter: Payer: Self-pay | Admitting: Neurology

## 2015-12-19 VITALS — BP 124/92 | HR 128 | Ht 62.0 in | Wt 120.0 lb

## 2015-12-19 DIAGNOSIS — R569 Unspecified convulsions: Secondary | ICD-10-CM | POA: Diagnosis not present

## 2015-12-19 MED ORDER — LEVETIRACETAM ER 500 MG PO TB24: ORAL_TABLET | ORAL | Status: DC

## 2015-12-19 NOTE — Telephone Encounter (Signed)
Letter provided and signed by Dr. Terrace ArabiaYan.  It has been placed up front for pick up.  Patient aware.

## 2015-12-19 NOTE — Progress Notes (Signed)
PATIENT: Donna Short DOB: May 13, 1991  Chief Complaint  Patient presents with  . Seizures    She is here with her father, Donna Short.  She had her first and only seizure, while at work, on 12/07/15. She did not get many details from her coworkers, other than they witnessed her fall to the floor, had excessive drooling and bit her tongue.  She went to the ED for evaluation and was started on Keppra , BID.  She has not had any further episodes.     HISTORICAL  Donna Short is a 25 year old right-handed female, accompanied by her godfather Donna Short, seen in refer by emergency room for evaluation of her only seizure in December 19 2015.  In April 14th 2017,She had a set onset generalized tonic-clonic seizure at work in [employer], she woke up in the ambulance, 3 hours later, while at emergency room, she had her second generalized tonic-clonic seizure, she has been treated with Keppra 500 mg twice a day, tolerating medication well.  I have personally reviewed CAT scan of the brain in April 2017, no intra-cranial abnormality, right parietal skull abrasion,  Laboratory evaluation showed mild anemia, with hemoglobin 11.8, normal CMP, negative pregnancy test.  She denies focal symptoms, she was developmentally normal, denier history of febrile seizure, there was no family history of epilepsy.  She works at [employer] for [employer],  REVIEW OF SYSTEMS: Full 14 system review of systems performed and notable only for As above  ALLERGIES: Allergies  Allergen Reactions  . Benadryl [Diphenhydramine Hcl (Sleep)] Rash  . Latex Rash  . Shrimp [Shellfish Allergy] Rash    Hives     HOME MEDICATIONS: Keppra 500 mg twice a day  PAST MEDICAL HISTORY: Past Medical History  Diagnosis Date  . Asthma   . Seizures (HCC)     PAST SURGICAL HISTORY: Past Surgical History  Procedure Laterality Date  . No past surgeries      FAMILY HISTORY: Family History    Problem Relation Age of Onset  . Breast cancer Mother   . Diabetes Father     SOCIAL HISTORY:  Social History   Social History  . Marital Status: Single    Spouse Name: N/A  . Number of Children: 0  . 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 Smoking status: Former Smoker    Types: Cigarettes  . Smokeless tobacco: Not on file  . Alcohol Use: No  . Drug Use: No  . Sexual Activity: Not on file   Other Topics Concern  . Not on file   Social History Narrative   Lives at home alone.   Right-handed.   No caffeine use.     PHYSICAL EXAM   Filed Vitals:   12/19/15 1021  BP: 124/92  Pulse: 128  Height:  (1.575 m)  Weight: 120 lb (54.432 kg)    Not recorded      Body mass index is 21.94 kg/(m^2).  PHYSICAL EXAMNIATION:  Gen: NAD, conversant, well nourised, obese, well groomed                     Cardiovascular: Regular rate rhythm, no peripheral edema, warm, nontender. Eyes: Conjunctivae clear without exudates or hemorrhage Neck: Supple, no carotid bruise. Pulmonary: Clear to auscultation bilaterally   NEUROLOGICAL EXAM:  MENTAL STATUS: Speech:    Speech is normal;  fluent and spontaneous with normal comprehension.  Cognition:     Orientation to time, place and person     Normal recent and remote memory     Normal Attention span and concentration     Normal Language, naming, repeating,spontaneous speech     Fund of knowledge   CRANIAL NERVES: CN II: Visual fields are full to confrontation. Fundoscopic exam is normal with sharp discs and no vascular changes. Pupils are round equal and briskly reactive to light. CN III, IV, VI: extraocular movement are normal. No ptosis. CN V: Facial sensation is intact to pinprick in all 3 divisions bilaterally. Corneal responses are intact.  CN VII: Face is symmetric with normal eye closure and smile. CN VIII: Hearing is normal to rubbing  fingers CN IX, X: Palate elevates symmetrically. Phonation is normal. CN XI: Head turning and shoulder shrug are intact CN XII: Tongue is midline with normal movements and no atrophy.  MOTOR: There is no pronator drift of out-stretched arms. Muscle bulk and tone are normal. Muscle strength is normal.  REFLEXES: Reflexes are 2+ and symmetric at the biceps, triceps, knees, and ankles. Plantar responses are flexor.  SENSORY: Intact to light touch, pinprick, positional sensation and vibratory sensation are intact in fingers and toes.  COORDINATION: Rapid alternating movements and fine finger movements are intact. There is no dysmetria on finger-to-nose and heel-knee-shin.    GAIT/STANCE: Posture is normal. Gait is steady with normal steps, base, arm swing, and turning. Heel and toe walking are normal. Tandem gait is normal.  Romberg is absent.   DIAGNOSTIC DATA (LABS, IMAGING, TESTING) - I reviewed patient records, labs, notes, testing and imaging myself where available.   ASSESSMENT AND PLAN  Donna Short is a 25 y.o. 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 Ph.D.  [facility] [address], [address] [place], [place] [address] Ph: [phone] Fax: [phone]  CC: Referring Provider

## 2015-12-19 NOTE — Patient Instructions (Signed)
Common seizure triggers are: Sleep deprivation, dehydration, overheating, stress, hypoglycemia or skipping meals, certain medications or excessive alcohol use, especially stopping alcohol abruptly if you have had heavy alcohol use before (aka alcohol withdrawal seizure).   Prolonged Seizures: If you have a prolonged seizure over 2-5 minutes or back to back seizures, call or have someone call 911 or take you to the nearest emergency room.   Safty Precaution: You cannot drive a car or operate any other machinery or vehicle within 6 months of a seizure. Please do not swim alone or take a tub bath for safety. Do not cook with large quantities of boiling water or oil for safety.   Compliance with your seizure medications: Take your medicine for seizure prevention regularly and do not skip doses or stop medication abruptly and tone are told to do so by your healthcare provider. Try to get a refill on your antiepileptic medication ahead of time, so you are not at risk of running out. If you run out of the seizure medication and do not have a refill at hand she may run into medication withdrawal seizures.   Avoid taking Wellbutrin, narcotic pain medications and tramadol, as they can lower seizure threshold.

## 2015-12-19 NOTE — Telephone Encounter (Signed)
Patient called, was just seen for office visit today with Dr. Terrace ArabiaYan. Patient's work needs a note from the Doctor saying when she can be released to go back to work. "Monday is fine, time goes in on Tuesday, new week starts over on Wednesday. Would like to start on Wednesday, so it will be a fresh start".

## 2015-12-21 ENCOUNTER — Telehealth: Payer: Self-pay | Admitting: Neurology

## 2015-12-21 NOTE — Telephone Encounter (Signed)
Patient called has question regarding levETIRAcetam (KEPPRA XR) 500 MG 24 hr tablet.

## 2015-12-24 NOTE — Telephone Encounter (Signed)
Pt has called back sts she is very concerned about the side effects of this medication. She wants to be reassured she will be ok at work

## 2015-12-24 NOTE — Telephone Encounter (Signed)
She is already taking Keppra 500mg , BID and doing well with no reported adverse side effects.  Dr. Terrace ArabiaYan is transitioning her to Keppra XR 1000mg  at bedtime.  Called patient to review her medication and dosage - the only change was from IR BID to XR QHS  She will go ahead and start the XR and call us back with any concerns.

## 2015-12-25 ENCOUNTER — Ambulatory Visit: Payer: Managed Care, Other (non HMO) | Admitting: Neurology

## 2015-12-26 MED ORDER — LEVETIRACETAM 500 MG PO TABS: 500.0000 mg | ORAL_TABLET | Freq: Two times a day (BID) | ORAL | Status: DC

## 2015-12-26 NOTE — Addendum Note (Signed)
Addended by: Lindell SparKIRKMAN, Lexiana Spindel C on: 12/26/2015 04:50 PM   Modules accepted: Orders

## 2015-12-26 NOTE — Telephone Encounter (Addendum)
Ok per Dr. Terrace ArabiaYan for patient to switch back to Keppra 500mg , BID and discontinue Keppra XR 1000mg , qhs.  Called pharmacy to void additional refills of Keppra XR.  New rx provided for Keppra 500mg , BID.  Patient aware change has been made at pharmacy.

## 2015-12-26 NOTE — Telephone Encounter (Signed)
Patient is calling stating she wants to go back to taking Keppra 500mg  because she took a Keppra XR 1000mg  last night and she had to be sent home from work today because of dizziness. Please call Keppra 500mg  to [employer] on [address].

## 2016-01-07 ENCOUNTER — Telehealth: Payer: Self-pay | Admitting: Neurology

## 2016-01-07 NOTE — Telephone Encounter (Signed)
Left message for a return call

## 2016-01-07 NOTE — Telephone Encounter (Signed)
Pt returned call . Nurse was skyed but unable to answer because she was with a pt . Will call pt back.

## 2016-01-07 NOTE — Telephone Encounter (Signed)
Spoke to patient - she is aware to continue her Keppra, as prescribed and to keep her pending appointments (EEG, follow up).

## 2016-01-07 NOTE — Telephone Encounter (Signed)
Pt called inquiring if she should take levETIRAcetam (KEPPRA) 500 MG tablet . Pt called said she is out of Keppra. Operator relayed there is 11 refills at pharmacy, her question is does she need to keep taking this medication. Pt took last tablet last night. Please call

## 2016-01-17 ENCOUNTER — Ambulatory Visit (INDEPENDENT_AMBULATORY_CARE_PROVIDER_SITE_OTHER): Payer: Managed Care, Other (non HMO) | Admitting: Neurology

## 2016-01-17 DIAGNOSIS — R569 Unspecified convulsions: Secondary | ICD-10-CM

## 2016-01-17 NOTE — Procedures (Signed)
   HISTORY: 25 years old female, presented with seizure in December 19 2015  TECHNIQUE:  16 channel EEG was performed based on standard 10-16 international system. One channel was dedicated to EKG, which has demonstrates normal sinus rhythm of 90 beats per minutes.  Upon awakening, the posterior background activity was well-developed, in alpha range, 9 Hz, low amplitude, reactive to eye opening and closure.  There was no evidence of epileptiform discharge.  Photic stimulation was performed, which induced a symmetric photic driving.  Hyperventilation was performed, there was no abnormality elicit.  No sleep was achieved.  CONCLUSION: This is a  normal awake EEG.  There is no electrodiagnostic evidence of epileptiform discharge

## 2016-01-22 ENCOUNTER — Encounter: Payer: Self-pay | Admitting: Neurology

## 2016-01-22 ENCOUNTER — Ambulatory Visit (INDEPENDENT_AMBULATORY_CARE_PROVIDER_SITE_OTHER): Payer: Managed Care, Other (non HMO) | Admitting: Neurology

## 2016-01-22 VITALS — BP 153/104 | HR 106 | Ht 62.0 in | Wt 117.5 lb

## 2016-01-22 DIAGNOSIS — R569 Unspecified convulsions: Secondary | ICD-10-CM

## 2016-01-22 MED ORDER — LAMOTRIGINE 100 MG PO TABS: 100.0000 mg | ORAL_TABLET | Freq: Two times a day (BID) | ORAL | Status: DC

## 2016-01-22 MED ORDER — LAMOTRIGINE 25 MG PO TABS: ORAL_TABLET | ORAL | Status: DC

## 2016-01-22 NOTE — Patient Instructions (Signed)
Keppra 500mg    Lamotrigine    1st week,   500mg /500mg    25mg /25mg  2nd week  500mg /500mg    25mg  x2/25mg  x2 3rd week  0/500mg    25mg  x3/25mg  x3 4th week  0/0    100mg /100mg =25mg  x4

## 2016-01-22 NOTE — Progress Notes (Signed)
Chief Complaint  Patient presents with  . Seizures    She is here with her godfather, Jeani HawkingWillie Bradley.  She would like to discuss the results of her EEG.  She was unable to afford the ordered MRI.  Reports no further seizure activity.         PATIENT: Donna Short DOB: 1991/06/09  Chief Complaint  Patient presents with  . Seizures    She is here with her godfather, Jeani HawkingWillie Bradley.  She would like to discuss the results of her EEG.  She was unable to afford the ordered MRI.  Reports no further seizure activity.        HISTORICAL  Donna CurlsShamika M Short is a 25 year old right-handed female, accompanied by her godfather Mr. Elige RadonBradley, seen in refer by emergency room for evaluation of her only seizure in December 19 2015.  In April 14th 2017,She had acute onset generalized tonic-clonic seizure at work in [employer], she woke up in the ambulance, 3 hours later, while at emergency room, she had her second generalized tonic-clonic seizure, she has been treated with Keppra 500 mg twice a day, tolerating medication well.  I have personally reviewed CAT scan of the brain in April 2017, no intra-cranial abnormality, right parietal skull abrasion,  Laboratory evaluation showed mild anemia, with hemoglobin 11.8, normal CMP, negative pregnancy test.  She denies focal symptoms, she was developmentally normal, denier history of febrile seizure, there was no family history of epilepsy.  She works at [employer] for [employer]  UPDATE May 30th 2017: EEG was normal in May 27th 2017.  She still works at [employer], She could not afford MRI brain. She spent most of today's visit complaining her work-related stress    she is now taking Keppra 500mg  bid,  Noticed irritability, anxiety, mostly work related  REVIEW OF SYSTEMS: Full 14 system review of systems performed and notable only for Anxiety  ALLERGIES: Allergies  Allergen Reactions  . Benadryl [Diphenhydramine Hcl (Sleep)] Rash  .  Latex Rash  . Shrimp [Shellfish Allergy] Rash    Hives     HOME MEDICATIONS: Keppra 500 mg twice a day  PAST MEDICAL HISTORY: Past Medical History  Diagnosis Date  . Asthma   . Seizures (HCC)     PAST SURGICAL HISTORY: Past Surgical History  Procedure Laterality Date  . No past surgeries      FAMILY HISTORY: Family History  Problem Relation Age of Onset  . Breast cancer Mother   . Diabetes Father     SOCIAL HISTORY:  Social History   Social History  . Marital Status: Single    Spouse Name: N/A  . Number of Children: 0  . 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 Smoking status: Former Smoker    Types: Cigarettes  . Smokeless tobacco: Not on file  . Alcohol Use: No  . Drug Use: No  . Sexual Activity: Not on file   Other Topics Concern  . Not on file   Social History Narrative   Lives at home alone.   Right-handed.   No caffeine use.     PHYSICAL EXAM   Filed Vitals:   01/22/16 1422  BP: 153/104  Pulse: 106  Height: 5\' 2"  (1.575 m)  Weight: 117 lb 8 oz (53.298 kg)    Not recorded      Body mass index is 21.49 kg/(m^2).  PHYSICAL EXAMNIATION:  Gen: NAD, conversant, well nourised, obese, well groomed                     Cardiovascular: Regular rate rhythm, no peripheral edema, warm, nontender. Eyes: Conjunctivae clear without exudates or hemorrhage Neck: Supple, no carotid bruise. Pulmonary: Clear to auscultation bilaterally   NEUROLOGICAL EXAM:  MENTAL STATUS: Speech:    Speech is normal; fluent and spontaneous with normal comprehension.  Cognition:     Orientation to time, place and person     Normal recent and remote memory     Normal Attention span and concentration     Normal Language, naming, repeating,spontaneous speech     Fund of knowledge   CRANIAL NERVES: CN II: Visual fields are full to confrontation. Fundoscopic exam is normal with sharp discs  and no vascular changes. Pupils are round equal and briskly reactive to light. CN III, IV, VI: extraocular movement are normal. No ptosis. CN V: Facial sensation is intact to pinprick in all 3 divisions bilaterally. Corneal responses are intact.  CN VII: Face is symmetric with normal eye closure and smile. CN VIII: Hearing is normal to rubbing fingers CN IX, X: Palate elevates symmetrically. Phonation is normal. CN XI: Head turning and shoulder shrug are intact CN XII: Tongue is midline with normal movements and no atrophy.  MOTOR: There is no pronator drift of out-stretched arms. Muscle bulk and tone are normal. Muscle strength is normal.  REFLEXES: Reflexes are 2+ and symmetric at the biceps, triceps, knees, and ankles. Plantar responses are flexor.  SENSORY: Intact to light touch, pinprick, positional sensation and vibratory sensation are intact in fingers and toes.  COORDINATION: Rapid alternating movements and fine finger movements are intact. There is no dysmetria on finger-to-nose and heel-knee-shin.    GAIT/STANCE: Posture is normal. Gait is steady with normal steps, base, arm swing, and turning. Heel and toe walking are normal. Tandem gait is normal.  Romberg is absent.   DIAGNOSTIC DATA (LABS, IMAGING, TESTING) - I reviewed patient records, labs, notes, testing and imaging myself where available.   ASSESSMENT AND PLAN  Donna Short is a 25 y.o. female   Generalized epilepsy  Twice in December 07 2015  EEG was normal in May 2017   She is now taking Keppra 500 mg twice a day, complains of irritability anxiety we will switch her to lamotrigine 100 mg twice a day    No driving until seizure free for 6 months  Return to clinic in 6 month    Levert Feinstein, M.D. Ph.D.  [facility] [address] [place], [place] [address] Ph: [phone] Fax: [phone]  CC: Referring Provider

## 2016-07-25 ENCOUNTER — Ambulatory Visit: Payer: Managed Care, Other (non HMO) | Admitting: Nurse Practitioner

## 2016-07-28 ENCOUNTER — Encounter: Payer: Self-pay | Admitting: Nurse Practitioner

## 2016-07-31 ENCOUNTER — Encounter: Payer: Self-pay | Admitting: *Deleted

## 2016-07-31 ENCOUNTER — Telehealth: Payer: Self-pay

## 2016-07-31 NOTE — Telephone Encounter (Signed)
[facility] - this is her fourth day without medication.  She had a seizure-like event today and was calling for guidance.  Dr. Terrace ArabiaYan has reviewed her chart - she has refills of her Lamictal 100mg , BID at the pharmacy.  She should restart at that dose.  She will need to follow up for appropriate care.  The patient was instructed not to drive in May 2017 but says she has continued to drive.  I told her this was against medical advice and reviewed the [place] law with her.  I also educated her on the importance of taking her medication, as prescribed.  She is currently having some financial issues but has been asked to follow up within the next two months.  She verbalized understanding of this plan and was in agreement.

## 2016-07-31 NOTE — Telephone Encounter (Signed)
Pt called to say that she received a letter due to not showing up for her appt on 12/3. Stated that she did call a week prior to appt to cancel. She saw Dr. Terrace ArabiaYan in May and EEG was normal.  She also reports that she had another episode at work this morning where she "froze and fell out." Had a similar episode the day before yesterday. A co-worker "caught" her today and she did not have LOC. Was checked by EMS but declined going to hospital. Says that she was unable to tolerate  Keppra and has run out of Lamictal. Asked if "falling out" could be related to stress or medication. Needs to "let her job know something." Recommended that she r/s her appt but says that she cannot afford her co-pay.

## 2016-08-13 ENCOUNTER — Ambulatory Visit (HOSPITAL_COMMUNITY)
Admission: EM | Admit: 2016-08-13 | Discharge: 2016-08-13 | Disposition: A | Discharge status: Home / Self Care | Payer: Managed Care, Other (non HMO) | Attending: Emergency Medicine | Admitting: Emergency Medicine

## 2016-08-13 ENCOUNTER — Encounter (HOSPITAL_COMMUNITY): Payer: Self-pay | Admitting: Emergency Medicine

## 2016-08-13 DIAGNOSIS — H6121 Impacted cerumen, right ear: Secondary | ICD-10-CM

## 2016-08-13 NOTE — ED Provider Notes (Signed)
CSN: [id number]     Arrival date & time 08/13/16  1428 History   First MD Initiated Contact with Patient 08/13/16 1500     Chief Complaint  Patient presents with  . Ear Fullness   (Consider location/radiation/quality/duration/timing/severity/associated sxs/prior Treatment) 25 year old female presents to clinic with three day history of ear fullness and decreased hearing. Patient denies recent illness, denies congestion, cough, sinus pain or pressure, states she has been using sweet oils in her ears for the last two days without relief and has been trying to use a Q tip to remove wax.   The history is provided by the patient.  Ear Fullness  Pertinent negatives include no shortness of breath.    Past Medical History:  Diagnosis Date  . Asthma   . Seizures (HCC)    Past Surgical History:  Procedure Laterality Date  . NO PAST SURGERIES     Family History  Problem Relation Age of Onset  . Breast cancer Mother   . Diabetes Father    Social History  Substance Use Topics  . Smoking status: Former Smoker    Types: Cigarettes  . Smokeless tobacco: Not on file  . Alcohol use No   OB History    No data available     Review of Systems  HENT: Positive for hearing loss. Negative for congestion, dental problem, ear discharge, ear pain, postnasal drip, rhinorrhea, sinus pain, sinus pressure, sore throat and tinnitus.   Eyes: Negative for pain, discharge and itching.  Respiratory: Negative for cough, shortness of breath and wheezing.     Allergies  Benadryl [diphenhydramine hcl (sleep)]; Latex; and Shrimp [shellfish allergy]  Home Medications   Prior to Admission medications   Medication Sig Start Date End Date Taking? Authorizing Provider  lamoTRIgine (LAMICTAL) 100 MG tablet Take 1 tablet (100 mg total) by mouth 2 (two) times daily. 01/22/16   Levert FeinsteinYijun Yan, MD   Meds Ordered and Administered this Visit  Medications - No data to display  BP (!) 150/105 (BP Location: Left Arm)    Pulse (!) 125   Temp 99.5 F (37.5 C) (Oral)   Resp 18   SpO2 99%  No data found.   Physical Exam  Constitutional: She appears well-developed and well-nourished. No distress.  HENT:  Head: Normocephalic and atraumatic.  Right Ear: External ear normal. Decreased hearing is noted.  Left Ear: Hearing, tympanic membrane, external ear and ear canal normal.  Ears:  Nose: Nose normal.  Mouth/Throat: Oropharynx is clear and moist.  Eyes: Pupils are equal, round, and reactive to light.  Neck: Neck supple.  Lymphadenopathy:    She has no cervical adenopathy.  Skin: She is not diaphoretic.    Urgent Care Course   Clinical Course     Procedures (including critical care time)  Labs Review Labs Reviewed - No data to display  Imaging Review No results found.   Visual Acuity Review  Right Eye Distance:   Left Eye Distance:   Bilateral Distance:    Right Eye Near:   Left Eye Near:    Bilateral Near:         MDM   1. Impacted cerumen of right ear    Right ear flushed in clinic, no trauma noted to the Tm, patient advised not to use Q tips or any other object to clean her ears. Should symptoms reoccur follow up with PCP or return to clinic.     Dorena BodoLawrence Fani Rotondo, NP 08/13/16 1530

## 2016-08-13 NOTE — Discharge Instructions (Signed)
Do not use Q tips to clean the ears., May use OTC cerumenolytic of choice in the future if she chooses.  Should symptoms reoccur follow up with primary care provider or return to clinic.

## 2016-08-13 NOTE — ED Triage Notes (Signed)
[facility] a [facility] of bilateral ear fullness x 3 days. The patient denied any pain.

## 2016-11-10 ENCOUNTER — Encounter (HOSPITAL_COMMUNITY): Payer: Self-pay | Admitting: Emergency Medicine

## 2016-11-10 ENCOUNTER — Ambulatory Visit (HOSPITAL_COMMUNITY)
Admission: EM | Admit: 2016-11-10 | Discharge: 2016-11-10 | Disposition: A | Discharge status: Home / Self Care | Payer: Managed Care, Other (non HMO) | Attending: Family Medicine | Admitting: Family Medicine

## 2016-11-10 DIAGNOSIS — R197 Diarrhea, unspecified: Secondary | ICD-10-CM

## 2016-11-10 DIAGNOSIS — A084 Viral intestinal infection, unspecified: Secondary | ICD-10-CM

## 2016-11-10 DIAGNOSIS — R112 Nausea with vomiting, unspecified: Secondary | ICD-10-CM

## 2016-11-10 DIAGNOSIS — R000 Tachycardia, unspecified: Secondary | ICD-10-CM

## 2016-11-10 DIAGNOSIS — R509 Fever, unspecified: Secondary | ICD-10-CM

## 2016-11-10 LAB — POCT I-STAT, CHEM 8
BUN: 8 mg/dL (ref 6–20)
Calcium, Ion: 1.17 mmol/L (ref 1.15–1.40)
Chloride: 102 mmol/L (ref 101–111)
Creatinine, Ser: 0.5 mg/dL (ref 0.44–1.00)
Glucose, Bld: 70 mg/dL (ref 65–99)
HCT: 46 % (ref 36.0–46.0)
Hemoglobin: 15.6 g/dL — ABNORMAL HIGH (ref 12.0–15.0)
POTASSIUM: 4.1 mmol/L (ref 3.5–5.1)
SODIUM: 135 mmol/L (ref 135–145)
TCO2: 16 mmol/L (ref 0–100)

## 2016-11-10 MED ORDER — SODIUM CHLORIDE 0.9 % IV BOLUS (SEPSIS)
1000.0000 mL | Freq: Once | INTRAVENOUS | Status: AC
  Administered 2016-11-10: 1000 mL via INTRAVENOUS

## 2016-11-10 MED ORDER — ONDANSETRON 4 MG PO TBDP
ORAL_TABLET | ORAL | Status: AC
  Filled 2016-11-10: qty 1

## 2016-11-10 MED ORDER — ACETAMINOPHEN 325 MG PO TABS
975.0000 mg | ORAL_TABLET | Freq: Once | ORAL | Status: AC
  Administered 2016-11-10: 975 mg via ORAL

## 2016-11-10 MED ORDER — ONDANSETRON 4 MG PO TBDP
4.0000 mg | ORAL_TABLET | Freq: Once | ORAL | Status: AC
  Administered 2016-11-10: 4 mg via ORAL

## 2016-11-10 MED ORDER — ACETAMINOPHEN 325 MG PO TABS
ORAL_TABLET | ORAL | Status: AC
  Filled 2016-11-10: qty 3

## 2016-11-10 MED ORDER — ONDANSETRON 4 MG PO TBDP: 4.0000 mg | ORAL_TABLET | Freq: Three times a day (TID) | ORAL | 0 refills | Status: DC | PRN

## 2016-11-10 NOTE — ED Notes (Signed)
Patient IV bolus completed.

## 2016-11-10 NOTE — ED Triage Notes (Addendum)
See s/s.  Patient describes cold symptoms, but also admits her heart is going fast and this is not normal for her.  Patient reports feeling as if she was going to have panic attacks.   Diarrhea x 5 today Vomiting x 4

## 2016-11-10 NOTE — ED Provider Notes (Signed)
CSN: [id number]     Arrival date & time 11/10/16  1627 History   First MD Initiated Contact with Patient 11/10/16 1801     Chief Complaint  Patient presents with  . Fever   (Consider location/radiation/quality/duration/timing/severity/associated sxs/prior Treatment) Patient c/o NVD and fever today.  She has rapid heart rate and had to come home from work because she felt dizzy at work.   The history is provided by the patient.  Fever  Temp source:  Subjective Severity:  Moderate Onset quality:  Sudden Duration:  1 day Timing:  Constant Progression:  Worsening Chronicity:  New Relieved by:  Nothing Ineffective treatments:  None tried Associated symptoms: diarrhea, nausea and vomiting     Past Medical History:  Diagnosis Date  . Asthma   . Seizures (HCC)    Past Surgical History:  Procedure Laterality Date  . NO PAST SURGERIES     Family History  Problem Relation Age of Onset  . Diabetes Father   . Breast cancer Mother    Social History  Substance Use Topics  . Smoking status: Former Smoker    Types: Cigarettes  . Smokeless tobacco: Not on file  . Alcohol use No   OB History    No data available     Review of Systems  Constitutional: Positive for fatigue and fever.  HENT: Negative.   Eyes: Negative.   Respiratory: Negative.   Cardiovascular:       Tachycardia  Gastrointestinal: Positive for diarrhea, nausea and vomiting.  Endocrine: Negative.   Genitourinary: Negative.   Musculoskeletal: Negative.   Skin: Negative.   Allergic/Immunologic: Negative.   Neurological: Negative.   Hematological: Negative.   Psychiatric/Behavioral: Negative.     Allergies  Benadryl [diphenhydramine hcl (sleep)]; Latex; and Shrimp [shellfish allergy]  Home Medications   Prior to Admission medications   Medication Sig Start Date End Date Taking? Authorizing Provider  lamoTRIgine (LAMICTAL) 100 MG tablet Take 1 tablet (100 mg total) by mouth 2 (two) times daily. 01/22/16    Levert Feinstein, MD  ondansetron (ZOFRAN ODT) 4 MG disintegrating tablet Take 1 tablet (4 mg total) by mouth every 8 (eight) hours as needed for nausea or vomiting. 11/10/16   Deatra Canter, FNP   Meds Ordered and Administered this Visit   Medications  acetaminophen (TYLENOL) tablet 975 mg (975 mg Oral Given 11/10/16 1817)  ondansetron (ZOFRAN-ODT) disintegrating tablet 4 mg (4 mg Oral Given 11/10/16 1816)  sodium chloride 0.9 % bolus 1,000 mL (1,000 mLs Intravenous Given 11/10/16 1843)    BP (!) 150/113 (BP Location: Right Arm)   Pulse (!) 148   Temp 100 F (37.8 C) (Oral)   Resp (!) 24   SpO2 100%  No data found.   Physical Exam  Constitutional: She appears well-developed and well-nourished.  HENT:  Head: Normocephalic and atraumatic.  Right Ear: External ear normal.  Left Ear: External ear normal.  Mouth/Throat: Oropharynx is clear and moist.  Eyes: Conjunctivae and EOM are normal. Pupils are equal, round, and reactive to light.  Neck: Normal range of motion. Neck supple.  Cardiovascular: Regular rhythm and normal heart sounds.   Tachy  Pulmonary/Chest: Effort normal and breath sounds normal.  Abdominal: Soft. There is tenderness.  Neurological: She is alert.  Nursing note and vitals reviewed.   Urgent Care Course     Procedures (including critical care time)  Labs Review Labs Reviewed  POCT I-STAT, CHEM 8 - Abnormal; Notable for the following:  Result Value   Hemoglobin 15.6 (*)    All other components within normal limits    Imaging Review No results found.   Visual Acuity Review  Right Eye Distance:   Left Eye Distance:   Bilateral Distance:    Right Eye Near:   Left Eye Near:    Bilateral Near:         MDM   1. Nausea and vomiting, intractability of vomiting not specified, unspecified vomiting type   2. Diarrhea, unspecified type   3. Fever and chills   4. Tachycardia   5. Viral gastroenteritis    Zofran ODT 4mg  one po tid prn #21 IV  NS one liter given Tylenol given  Push po fluids, rest, tylenol and motrin otc prn as directed for fever, arthralgias, and myalgias.  Follow up prn if sx's continue or persist.  Work excuse for 3 days.      Deatra CanterWilliam J Oxford, FNP 11/10/16 1924

## 2016-12-12 ENCOUNTER — Telehealth: Payer: Self-pay | Admitting: Neurology

## 2016-12-12 NOTE — Telephone Encounter (Signed)
Got a message from call center this am. Called pt back and she stated that she was working in [employer]. She was happy and feeling good. But people around her started talking about her and annoying her. She was very angry and feeling she was about to pass out. She did not pass out, however, and she walked out her job yesterday, "can not deal with it" and went home. This morning, she woke up, took her a while to get up and still feel shaky. She said she had previous seizure with similar triggers. She does not want to have another seizure. She is afraid to have shower and fall and having seizure. She was then called by her job to come in to work. She got into care and was driving but she did not feel she can make it. She is calling to see what to do.   I told her that she really needs to talk to her employer about her working environment and working relations to decrease her anxiety, stress, fear and eliminate seizure triggers. I recommend her not driving if she does not feel comfortable or having any seizure like activities, and continue her seizure medications. She expressed understanding and appreciation.  Marvel Plan, MD PhD Stroke Neurology 12/12/2016 11:20 AM

## 2016-12-15 NOTE — Telephone Encounter (Signed)
Left message for a return call requesting that she schedule a follow up appt.  This appt can be scheduled with NP.

## 2016-12-15 NOTE — Telephone Encounter (Signed)
Left another message requesting a return call to schedule a follow up w/ NP.

## 2016-12-15 NOTE — Telephone Encounter (Signed)
Last clinical visit was in May 2017 for seizure, anxiety,  Please call her for follow-up appointment with nurse practitioner

## 2017-05-08 ENCOUNTER — Ambulatory Visit: Payer: Managed Care, Other (non HMO) | Admitting: Family Medicine

## 2017-07-31 ENCOUNTER — Encounter (HOSPITAL_COMMUNITY): Payer: Self-pay | Admitting: Emergency Medicine

## 2017-07-31 ENCOUNTER — Ambulatory Visit (HOSPITAL_COMMUNITY)
Admission: EM | Admit: 2017-07-31 | Discharge: 2017-07-31 | Disposition: A | Discharge status: Home / Self Care | Payer: Managed Care, Other (non HMO) | Attending: Internal Medicine | Admitting: Internal Medicine

## 2017-07-31 DIAGNOSIS — H00012 Hordeolum externum right lower eyelid: Secondary | ICD-10-CM

## 2017-07-31 MED ORDER — SULFAMETHOXAZOLE-TRIMETHOPRIM 800-160 MG PO TABS: 1.0000 | ORAL_TABLET | Freq: Two times a day (BID) | ORAL | 0 refills | Status: AC

## 2017-07-31 NOTE — ED Triage Notes (Signed)
Pt here with swollen area to bottom eye lid on right eye; pt sts pain and drainage

## 2017-07-31 NOTE — Discharge Instructions (Signed)
Please continue warm compresses frequently throughout the day. Take oral antibiotic for 5 days.  This should improve in a few days.

## 2017-07-31 NOTE — ED Provider Notes (Signed)
[facility]    CSN: [id number] Arrival date & time: 07/31/17  1300     History   Chief Complaint Chief [id number]  Patient presents with  . Eye Pain    HPI Donna Short is a 26 y.o. female presenting with 2 days of lower right eyelid swelling and 1 day of increased area of swelling, redness, and induration. Pain with eye movement, relieved with eye shut. Mild blurriness. No intra ocular pain. Reports that it is draining. Tried warm compresses.   HPI  Past Medical History:  Diagnosis Date  . Asthma   . Seizures [facility](HCC)     Patient Active Problem List   Diagnosis Date Noted  . Seizures (HCC) 12/19/2015    Past Surgical History:  Procedure Laterality Date  . NO PAST SURGERIES      OB History    No data available       Home Medications    Prior to Admission medications   Medication Sig Start Date End Date Taking? Authorizing Provider  lamoTRIgine (LAMICTAL) 100 MG tablet Take 1 tablet (100 mg total) by mouth 2 (two) times daily. 01/22/16   Levert FeinsteinYan, Yijun, MD  ondansetron (ZOFRAN ODT) 4 MG disintegrating tablet Take 1 tablet (4 mg total) by mouth every 8 (eight) hours as needed for nausea or vomiting. 11/10/16   Deatra Canterxford, William J, FNP  sulfamethoxazole-trimethoprim (BACTRIM DS,SEPTRA DS) 800-160 MG tablet Take 1 tablet by mouth 2 (two) times daily for 5 days. 07/31/17 08/05/17  Wieters, Junius CreamerHallie C, PA-C    Family History Family History  Problem Relation Age of Onset  . Diabetes Father   . Breast cancer Mother     Social History Social History   Tobacco Use  . Smoking status: Former Smoker    Types: Cigarettes  Substance Use Topics  . Alcohol use: No    Alcohol/week: 0.0 oz  . Drug use: No     Allergies   Benadryl [diphenhydramine hcl (sleep)]; Latex; and Shrimp [shellfish allergy]   Review of Systems Review of Systems  Constitutional: Negative for fatigue and fever.  HENT: Negative for ear pain.   Eyes: Positive for discharge, itching and  visual disturbance. Negative for photophobia, pain and redness.     Physical Exam Triage Vital Signs ED Triage Vitals [07/31/17 1311]  Enc Vitals Group     BP (!) 158/119     Pulse Rate (!) 132     Resp 16     Temp 99.4 F (37.4 C)     Temp Source Oral     SpO2 98 %     Weight      Height      Head Circumference      Peak Flow      Pain Score      Pain Loc      Pain Edu?      Excl. in GC?    No data found.  Updated Vital Signs BP (!) 158/119 (BP Location: Left Arm) Comment: Reported BP to Nurse Laneta SimmersJessica Branch  Pulse (!) 126   Temp 99.4 F (37.4 C) (Oral)   Resp 16   SpO2 98%   Visual Acuity: Right Eye Distance: 20/25 Left Eye Distance: 20/25 Both Eyes Distance: 20/25    Physical Exam  Eyes: Conjunctivae and EOM are normal. Pupils are equal, round, and reactive to light. Right eye exhibits hordeolum.  Moderate swelling and erythema of right lower lid, 1 cm area of increased induration, with pallor; no  discharge.        UC Treatments / Results  Labs (all labs ordered are listed, but only abnormal results are displayed) Labs Reviewed - No data to display  EKG  EKG Interpretation None       Radiology No results found.  Procedures Procedures (including critical care time)  Medications Ordered in UC Medications - No data to display   Initial Impression / Assessment and Plan / UC Course  I have reviewed the triage vital signs and the nursing notes.  Pertinent labs & imaging results that were available during my care of the patient were reviewed by me and considered in my medical decision making (see chart for details).     Hordeolum: Continue warm compresses with gentle massage. May try gentle lid scrub with dilute baby shampoo. 5 day course of oral bactrim provided.  High blood pressure and tachycardia noted. Seems to be consistently elevated from past visits. She has made arrangements to have her blood pressure evaluated.     Final Clinical  Impressions(s) / UC Diagnoses   Final diagnoses:  Hordeolum externum of right lower eyelid    ED Discharge Orders        Ordered    sulfamethoxazole-trimethoprim (BACTRIM DS,SEPTRA DS) 800-160 MG tablet  2 times daily     07/31/17 1340       Controlled Substance Prescriptions [place] Controlled Substance Registry consulted? Not Applicable   Lew DawesWieters, Hallie C, PA-C 07/31/17 1405    Lew DawesWieters, Hallie C, PA-C 07/31/17 1406    Patterson HammersmithWieters, Hallie C, [place] JerseyPA-C 07/31/17 1532

## 2017-09-19 ENCOUNTER — Ambulatory Visit (HOSPITAL_COMMUNITY)
Admission: EM | Admit: 2017-09-19 | Discharge: 2017-09-19 | Disposition: A | Discharge status: Home / Self Care | Payer: Managed Care, Other (non HMO) | Attending: Family Medicine | Admitting: Family Medicine

## 2017-09-19 ENCOUNTER — Encounter (HOSPITAL_COMMUNITY): Payer: Self-pay | Admitting: Family Medicine

## 2017-09-19 DIAGNOSIS — A084 Viral intestinal infection, unspecified: Secondary | ICD-10-CM

## 2017-09-19 MED ORDER — ONDANSETRON HCL 4 MG PO TABS: 4.0000 mg | ORAL_TABLET | Freq: Four times a day (QID) | ORAL | 0 refills | Status: DC

## 2017-09-19 MED ORDER — ONDANSETRON 4 MG PO TBDP
4.0000 mg | ORAL_TABLET | Freq: Once | ORAL | Status: AC
  Administered 2017-09-19: 4 mg via ORAL

## 2017-09-19 MED ORDER — ONDANSETRON 4 MG PO TBDP
ORAL_TABLET | ORAL | Status: AC
  Filled 2017-09-19: qty 1

## 2017-09-19 NOTE — ED Provider Notes (Signed)
[facility]    CSN: [id number] Arrival date & time: 09/19/17  1618     History   Chief Complaint Chief Complaint  Patient presents with  . Cough  . Emesis    HPI Donna CurlsShamika M Gullo is a 27 y.o. female.   HPI  Patient presents with a 3-day history of nausea, vomiting, diarrhea, objective fevers, and body aches.  Every time she tries to eat or drink, she starts having high levels of nausea and throws it up.  She is getting dizzy and lightheaded at work.  She denies recent travel or medication changes.  No sick contacts.  No blood in her vomit or in her stool.  Past Medical History:  Diagnosis Date  . Asthma   . Seizures [facility](HCC)     Patient Active Problem List   Diagnosis Date Noted  . Seizures (HCC) 12/19/2015    Past Surgical History:  Procedure Laterality Date  . NO PAST SURGERIES      Home Medications    Prior to Admission medications   Medication Sig Start Date End Date Taking? Authorizing Provider  lamoTRIgine (LAMICTAL) 100 MG tablet Take 1 tablet (100 mg total) by mouth 2 (two) times daily. 01/22/16   Levert FeinsteinYan, Yijun, MD  ondansetron (ZOFRAN) 4 MG tablet Take 1 tablet (4 mg total) by mouth every 6 (six) hours. 09/19/17   Sharlene DoryWendling, Kwynn Schlotter Paul, DO    Family History Family History  Problem Relation Age of Onset  . Diabetes Father   . Breast cancer Mother     Social History Social History   Tobacco Use  . Smoking status: Former Smoker    Types: Cigarettes  Substance Use Topics  . Alcohol use: No    Alcohol/week: 0.0 oz  . Drug use: No     Allergies   Benadryl [diphenhydramine hcl (sleep)]; Latex; and Shrimp [shellfish allergy]   Review of Systems Review of Systems  Constitutional: Negative for fever.  Gastrointestinal: Positive for abdominal pain.     Physical Exam Triage Vital Signs ED Triage Vitals [09/19/17 1743]  Enc Vitals Group     BP (!) 164/111     Pulse Rate (!) 139     Resp 18     Temp 99.8 F (37.7 C)     Temp src       SpO2 100 %   Updated Vital Signs BP (!) 164/111   Pulse (!) 139   Temp 99.8 F (37.7 C)   Resp 18   SpO2 100%   Physical Exam  Constitutional: She appears well-developed.  HENT:  Nose: Nose normal.  Mouth/Throat: No oropharyngeal exudate.  Mucous membranes are dry  Eyes: EOM are normal. Pupils are equal, round, and reactive to light.  Cardiovascular: Regular rhythm.  Tachycardic  Pulmonary/Chest: Effort normal and breath sounds normal. No respiratory distress.  Abdominal: Soft. Bowel sounds are normal. She exhibits no distension and no mass. There is tenderness. There is no guarding.  Skin: Skin is warm.  Psychiatric: She has a normal mood and affect. Judgment normal.     UC Treatments / Results  Procedures Procedures none  Medications Ordered in UC Medications  ondansetron (ZOFRAN-ODT) disintegrating tablet 4 mg      Initial Impression / Assessment and Plan / UC Course  I have reviewed the triage vital signs and the nursing notes.  Pertinent labs & imaging results that were available during my care of the patient were reviewed by me and considered in my medical decision  making (see chart for details).     27 year old female presents with likely viral gastroenteritis.  She is very dehydrated.  I offered to provide IV fluids, however she declined this.  She was given a dosage of Zofran here as well as a prescription for home.  A letter for work was also provided excusing her through Monday.  She was encouraged to replenish with fluids containing electrolytes such as Pedialyte, Gatorade, Powerade.  Follow-up if symptoms worsen or fail to improve.  The patient voiced understanding and agreement to the plan.  Final Clinical Impressions(s) / UC Diagnoses   Final diagnoses:  Viral gastroenteritis    ED Discharge Orders        Ordered    ondansetron (ZOFRAN) 4 MG tablet  Every 6 hours     09/19/17 1754       Controlled Substance Prescriptions [place] Controlled  Substance Registry consulted? Not Applicable   Sharlene Dory, [place] 09/19/17 4098

## 2017-09-19 NOTE — ED Triage Notes (Signed)
Pt here for N,V,D cough, fever and body aches since Thursday. Unable to eat or drink. Tachy in triage. sts that she has been having dizziness and getting light headed at work.

## 2017-11-02 LAB — HM PAP SMEAR

## 2018-01-12 ENCOUNTER — Ambulatory Visit (HOSPITAL_COMMUNITY)
Admission: EM | Admit: 2018-01-12 | Discharge: 2018-01-12 | Disposition: A | Discharge status: Home / Self Care | Payer: Managed Care, Other (non HMO) | Attending: Family Medicine | Admitting: Family Medicine

## 2018-01-12 ENCOUNTER — Encounter (HOSPITAL_COMMUNITY): Payer: Self-pay | Admitting: Emergency Medicine

## 2018-01-12 DIAGNOSIS — K529 Noninfective gastroenteritis and colitis, unspecified: Secondary | ICD-10-CM | POA: Diagnosis not present

## 2018-01-12 HISTORY — DX: Essential (primary) hypertension: I10

## 2018-01-12 MED ORDER — ONDANSETRON 4 MG PO TBDP: 4.0000 mg | ORAL_TABLET | Freq: Three times a day (TID) | ORAL | 0 refills | Status: DC | PRN

## 2018-01-12 NOTE — ED Triage Notes (Signed)
Pt here for N/V/D as well as cough

## 2018-01-12 NOTE — ED Provider Notes (Signed)
[facility]   [id number] 01/12/18 Arrival Time: 1014  ASSESSMENT & PLAN:  1. Gastroenteritis    Meds ordered this encounter  Medications  . ondansetron (ZOFRAN-ODT) 4 MG disintegrating tablet    Sig: Take 1 tablet (4 mg total) by mouth every 8 (eight) hours as needed for nausea or vomiting.    Dispense:  15 tablet    Refill:  0   Work note given.  Discussed typical duration of symptoms for suspected viral GI illness. Will do her best to ensure adequate fluid intake in order to avoid dehydration. Will proceed to the Emergency Department for evaluation if unable to tolerate PO fluids regularly.  Otherwise she will f/u with his PCP or here if not showing improvement over the next 48-72 hours.  Reviewed expectations re: course of current medical issues. Questions answered. Outlined signs and symptoms indicating need for more acute intervention. Patient verbalized understanding. After Visit Summary given.   SUBJECTIVE: History from: patient.  Donna Short is a 27 y.o. female who presents with complaint of non-bloody intermittent nausea and vomiting of undigested food with loose bowel movements. Onset abrupt, approx 36-48 hours ago. Abdominal discomfort: mild and cramping. Symptoms are unchanged since beginning. Aggravating factors: eating. Alleviating factors: none. Associated symptoms: fatigue. She denies fever. Appetite: decreased. PO intake: decreased. Ambulatory without assistance. Urinary symptoms: none. Last bowel movement without blood. OTC treatment: none reported.  No LMP recorded. Patient has had an injection.  Past Surgical History:  Procedure Laterality Date  . NO PAST SURGERIES     ROS: As per HPI.  OBJECTIVE:  Vitals:   01/12/18 1104  BP: (!) 161/125  Pulse: (!) 128  Resp: 18  Temp: 98.6 F (37 C)  TempSrc: Oral  SpO2: 99%    Tachycardia noted. Recheck pulse 112.  General appearance: alert; no distress Oropharynx: moist Lungs: clear  to auscultation bilaterally Heart: regular rhythm Abdomen: soft; non-distended; no significant abdominal tenderness, "cramping feeling"; bowel sounds present; no masses or organomegaly; no guarding or rebound tenderness Back: no CVA tenderness Extremities: no edema; symmetrical with no gross deformities Skin: warm and dry Neurologic: normal gait Psychological: alert and cooperative; normal mood and affect   Allergies  Allergen Reactions  . Benadryl [Diphenhydramine Hcl (Sleep)] Rash  . Latex Rash  . Shrimp [Shellfish Allergy] Rash    Hives                                                Past Medical History:  Diagnosis Date  . Asthma   . Hypertension   . Seizures (HCC)    Social History   Socioeconomic History  . Marital status: Single    Spouse name: Not on file  . Number of children: 0  . Years of education: 3 yrs coll  . Highest education level: Not on file  Occupational History  . Occupation: Clinical research associate at [employer]  . Financial resource strain: Not on file  . Food insecurity:    Worry: Not on file    Inability: Not on file  . Transportation needs:    Medical: Not on file    Non-medical: Not on file  Tobacco Use  . Smoking status: Former Smoker    Types: Cigarettes  Substance and Sexual Activity  . Alcohol use: No    Alcohol/week: 0.0 oz  .  Drug use: No  . Sexual activity: Not on file  Lifestyle  . Physical activity:    Days per week: Not on file    Minutes per session: Not on file  . Stress: Not on file  Relationships  . Social connections:    Talks on phone: Not on file    Gets together: Not on file    Attends religious service: Not on file    Active member of club or organization: Not on file    Attends meetings of clubs or organizations: Not on file    Relationship status: Not on file  . Intimate partner violence:    Fear of current or ex partner: Not on file    Emotionally abused: Not on file    Physically abused: Not on file     Forced sexual activity: Not on file  Other Topics Concern  . Not on file  Social History Narrative   Lives at home alone.   Right-handed.   No caffeine use.   Family History  Problem Relation Age of Onset  . Diabetes Father   . Breast cancer Mother      Mardella Layman, MD 01/12/18 1129

## 2018-01-12 NOTE — Discharge Instructions (Signed)

## 2018-01-16 ENCOUNTER — Emergency Department (HOSPITAL_COMMUNITY): Payer: Managed Care, Other (non HMO)

## 2018-01-16 ENCOUNTER — Encounter (HOSPITAL_COMMUNITY): Payer: Self-pay | Admitting: *Deleted

## 2018-01-16 ENCOUNTER — Encounter (HOSPITAL_COMMUNITY): Payer: Self-pay | Admitting: Emergency Medicine

## 2018-01-16 ENCOUNTER — Inpatient Hospital Stay (HOSPITAL_COMMUNITY)
Admission: EM | Admit: 2018-01-16 | Discharge: 2018-01-29 | DRG: 870 | Disposition: A | Discharge status: Home Health | Payer: Managed Care, Other (non HMO) | Attending: Internal Medicine | Admitting: Internal Medicine

## 2018-01-16 ENCOUNTER — Other Ambulatory Visit: Payer: Self-pay

## 2018-01-16 ENCOUNTER — Ambulatory Visit (HOSPITAL_COMMUNITY)
Admission: EM | Admit: 2018-01-16 | Discharge: 2018-01-16 | Disposition: A | Discharge status: Nursing Home (Includes ICF) | Payer: Managed Care, Other (non HMO) | Source: Home / Self Care | Attending: Emergency Medicine | Admitting: Emergency Medicine

## 2018-01-16 DIAGNOSIS — R112 Nausea with vomiting, unspecified: Secondary | ICD-10-CM

## 2018-01-16 DIAGNOSIS — I361 Nonrheumatic tricuspid (valve) insufficiency: Secondary | ICD-10-CM | POA: Diagnosis not present

## 2018-01-16 DIAGNOSIS — K859 Acute pancreatitis without necrosis or infection, unspecified: Secondary | ICD-10-CM

## 2018-01-16 DIAGNOSIS — J189 Pneumonia, unspecified organism: Secondary | ICD-10-CM | POA: Diagnosis not present

## 2018-01-16 DIAGNOSIS — K068 Other specified disorders of gingiva and edentulous alveolar ridge: Secondary | ICD-10-CM | POA: Diagnosis not present

## 2018-01-16 DIAGNOSIS — R0682 Tachypnea, not elsewhere classified: Secondary | ICD-10-CM | POA: Diagnosis not present

## 2018-01-16 DIAGNOSIS — R1310 Dysphagia, unspecified: Secondary | ICD-10-CM | POA: Diagnosis not present

## 2018-01-16 DIAGNOSIS — R6521 Severe sepsis with septic shock: Secondary | ICD-10-CM | POA: Diagnosis present

## 2018-01-16 DIAGNOSIS — I248 Other forms of acute ischemic heart disease: Secondary | ICD-10-CM | POA: Diagnosis present

## 2018-01-16 DIAGNOSIS — Y92009 Unspecified place in unspecified non-institutional (private) residence as the place of occurrence of the external cause: Secondary | ICD-10-CM

## 2018-01-16 DIAGNOSIS — I9589 Other hypotension: Secondary | ICD-10-CM

## 2018-01-16 DIAGNOSIS — Z4659 Encounter for fitting and adjustment of other gastrointestinal appliance and device: Secondary | ICD-10-CM

## 2018-01-16 DIAGNOSIS — J45909 Unspecified asthma, uncomplicated: Secondary | ICD-10-CM

## 2018-01-16 DIAGNOSIS — Z91128 Patient's intentional underdosing of medication regimen for other reason: Secondary | ICD-10-CM

## 2018-01-16 DIAGNOSIS — D696 Thrombocytopenia, unspecified: Secondary | ICD-10-CM | POA: Diagnosis present

## 2018-01-16 DIAGNOSIS — G9341 Metabolic encephalopathy: Secondary | ICD-10-CM | POA: Diagnosis not present

## 2018-01-16 DIAGNOSIS — Z9911 Dependence on respirator [ventilator] status: Secondary | ICD-10-CM

## 2018-01-16 DIAGNOSIS — Z79899 Other long term (current) drug therapy: Secondary | ICD-10-CM

## 2018-01-16 DIAGNOSIS — G9340 Encephalopathy, unspecified: Secondary | ICD-10-CM

## 2018-01-16 DIAGNOSIS — E440 Moderate protein-calorie malnutrition: Secondary | ICD-10-CM | POA: Diagnosis not present

## 2018-01-16 DIAGNOSIS — J810 Acute pulmonary edema: Secondary | ICD-10-CM | POA: Diagnosis not present

## 2018-01-16 DIAGNOSIS — R188 Other ascites: Secondary | ICD-10-CM | POA: Diagnosis not present

## 2018-01-16 DIAGNOSIS — R109 Unspecified abdominal pain: Secondary | ICD-10-CM | POA: Diagnosis not present

## 2018-01-16 DIAGNOSIS — R21 Rash and other nonspecific skin eruption: Secondary | ICD-10-CM | POA: Diagnosis present

## 2018-01-16 DIAGNOSIS — E876 Hypokalemia: Secondary | ICD-10-CM | POA: Diagnosis present

## 2018-01-16 DIAGNOSIS — E870 Hyperosmolality and hypernatremia: Secondary | ICD-10-CM | POA: Diagnosis not present

## 2018-01-16 DIAGNOSIS — F1010 Alcohol abuse, uncomplicated: Secondary | ICD-10-CM

## 2018-01-16 DIAGNOSIS — Y909 Presence of alcohol in blood, level not specified: Secondary | ICD-10-CM | POA: Diagnosis present

## 2018-01-16 DIAGNOSIS — R5381 Other malaise: Secondary | ICD-10-CM | POA: Diagnosis not present

## 2018-01-16 DIAGNOSIS — F10230 Alcohol dependence with withdrawal, uncomplicated: Secondary | ICD-10-CM | POA: Diagnosis not present

## 2018-01-16 DIAGNOSIS — N179 Acute kidney failure, unspecified: Secondary | ICD-10-CM | POA: Diagnosis present

## 2018-01-16 DIAGNOSIS — R197 Diarrhea, unspecified: Secondary | ICD-10-CM | POA: Diagnosis not present

## 2018-01-16 DIAGNOSIS — E871 Hypo-osmolality and hyponatremia: Secondary | ICD-10-CM | POA: Diagnosis not present

## 2018-01-16 DIAGNOSIS — N170 Acute kidney failure with tubular necrosis: Secondary | ICD-10-CM | POA: Diagnosis not present

## 2018-01-16 DIAGNOSIS — G40909 Epilepsy, unspecified, not intractable, without status epilepticus: Secondary | ICD-10-CM | POA: Diagnosis present

## 2018-01-16 DIAGNOSIS — K760 Fatty (change of) liver, not elsewhere classified: Secondary | ICD-10-CM | POA: Diagnosis present

## 2018-01-16 DIAGNOSIS — J90 Pleural effusion, not elsewhere classified: Secondary | ICD-10-CM

## 2018-01-16 DIAGNOSIS — F10231 Alcohol dependence with withdrawal delirium: Secondary | ICD-10-CM | POA: Diagnosis not present

## 2018-01-16 DIAGNOSIS — Z888 Allergy status to other drugs, medicaments and biological substances status: Secondary | ICD-10-CM

## 2018-01-16 DIAGNOSIS — Z91040 Latex allergy status: Secondary | ICD-10-CM

## 2018-01-16 DIAGNOSIS — E1110 Type 2 diabetes mellitus with ketoacidosis without coma: Secondary | ICD-10-CM | POA: Diagnosis present

## 2018-01-16 DIAGNOSIS — R000 Tachycardia, unspecified: Secondary | ICD-10-CM | POA: Diagnosis not present

## 2018-01-16 DIAGNOSIS — J9601 Acute respiratory failure with hypoxia: Secondary | ICD-10-CM | POA: Diagnosis not present

## 2018-01-16 DIAGNOSIS — Z91013 Allergy to seafood: Secondary | ICD-10-CM

## 2018-01-16 DIAGNOSIS — Z720 Tobacco use: Secondary | ICD-10-CM

## 2018-01-16 DIAGNOSIS — Z803 Family history of malignant neoplasm of breast: Secondary | ICD-10-CM

## 2018-01-16 DIAGNOSIS — D62 Acute posthemorrhagic anemia: Secondary | ICD-10-CM

## 2018-01-16 DIAGNOSIS — J690 Pneumonitis due to inhalation of food and vomit: Secondary | ICD-10-CM | POA: Diagnosis not present

## 2018-01-16 DIAGNOSIS — D72829 Elevated white blood cell count, unspecified: Secondary | ICD-10-CM

## 2018-01-16 DIAGNOSIS — R140 Abdominal distension (gaseous): Secondary | ICD-10-CM

## 2018-01-16 DIAGNOSIS — J9690 Respiratory failure, unspecified, unspecified whether with hypoxia or hypercapnia: Secondary | ICD-10-CM

## 2018-01-16 DIAGNOSIS — Z833 Family history of diabetes mellitus: Secondary | ICD-10-CM

## 2018-01-16 DIAGNOSIS — R1031 Right lower quadrant pain: Secondary | ICD-10-CM

## 2018-01-16 DIAGNOSIS — M5489 Other dorsalgia: Secondary | ICD-10-CM

## 2018-01-16 DIAGNOSIS — T82528A Displacement of other cardiac and vascular devices and implants, initial encounter: Secondary | ICD-10-CM

## 2018-01-16 DIAGNOSIS — E860 Dehydration: Secondary | ICD-10-CM | POA: Diagnosis present

## 2018-01-16 DIAGNOSIS — E872 Acidosis, unspecified: Secondary | ICD-10-CM

## 2018-01-16 DIAGNOSIS — K852 Alcohol induced acute pancreatitis without necrosis or infection: Secondary | ICD-10-CM | POA: Diagnosis present

## 2018-01-16 DIAGNOSIS — Z9289 Personal history of other medical treatment: Secondary | ICD-10-CM

## 2018-01-16 DIAGNOSIS — R569 Unspecified convulsions: Secondary | ICD-10-CM

## 2018-01-16 DIAGNOSIS — T426X6A Underdosing of other antiepileptic and sedative-hypnotic drugs, initial encounter: Secondary | ICD-10-CM | POA: Diagnosis present

## 2018-01-16 DIAGNOSIS — E861 Hypovolemia: Secondary | ICD-10-CM

## 2018-01-16 DIAGNOSIS — A419 Sepsis, unspecified organism: Principal | ICD-10-CM

## 2018-01-16 DIAGNOSIS — I10 Essential (primary) hypertension: Secondary | ICD-10-CM | POA: Diagnosis present

## 2018-01-16 DIAGNOSIS — J9600 Acute respiratory failure, unspecified whether with hypoxia or hypercapnia: Secondary | ICD-10-CM | POA: Diagnosis not present

## 2018-01-16 DIAGNOSIS — E869 Volume depletion, unspecified: Secondary | ICD-10-CM | POA: Diagnosis present

## 2018-01-16 DIAGNOSIS — D6489 Other specified anemias: Secondary | ICD-10-CM | POA: Diagnosis present

## 2018-01-16 DIAGNOSIS — Z880 Allergy status to penicillin: Secondary | ICD-10-CM

## 2018-01-16 HISTORY — DX: Acute pancreatitis without necrosis or infection, unspecified: K85.90

## 2018-01-16 LAB — CBC WITH DIFFERENTIAL/PLATELET
ABS IMMATURE GRANULOCYTES: 0.1 10*3/uL (ref 0.0–0.1)
Basophils Absolute: 0 10*3/uL (ref 0.0–0.1)
Basophils Relative: 0 %
Eosinophils Absolute: 0.1 10*3/uL (ref 0.0–0.7)
Eosinophils Relative: 1 %
HCT: 34.9 % — ABNORMAL LOW (ref 36.0–46.0)
HEMOGLOBIN: 12.2 g/dL (ref 12.0–15.0)
Immature Granulocytes: 1 %
LYMPHS PCT: 4 %
Lymphs Abs: 0.6 10*3/uL — ABNORMAL LOW (ref 0.7–4.0)
MCH: 37.5 pg — AB (ref 26.0–34.0)
MCHC: 35 g/dL (ref 30.0–36.0)
MCV: 107.4 fL — AB (ref 78.0–100.0)
MONO ABS: 1.4 10*3/uL — AB (ref 0.1–1.0)
Monocytes Relative: 9 %
NEUTROS ABS: 13.5 10*3/uL — AB (ref 1.7–7.7)
NEUTROS PCT: 85 %
Platelets: 79 10*3/uL — ABNORMAL LOW (ref 150–400)
RBC: 3.25 MIL/uL — ABNORMAL LOW (ref 3.87–5.11)
RDW: 11.5 % (ref 11.5–15.5)
WBC: 15.8 10*3/uL — ABNORMAL HIGH (ref 4.0–10.5)

## 2018-01-16 LAB — I-STAT BETA HCG BLOOD, ED (MC, WL, AP ONLY): I-stat hCG, quantitative: <5 m[IU]/mL (ref <5)

## 2018-01-16 LAB — PREPARE FRESH FROZEN PLASMA
Unit division: 0
Unit division: 0

## 2018-01-16 LAB — COMPREHENSIVE METABOLIC PANEL
ALT: 25 U/L (ref 14–54)
ANION GAP: 19 — AB (ref 5–15)
AST: 54 U/L — AB (ref 15–41)
Albumin: 2.7 g/dL — ABNORMAL LOW (ref 3.5–5.0)
Alkaline Phosphatase: 37 U/L — ABNORMAL LOW (ref 38–126)
BILIRUBIN TOTAL: 1.2 mg/dL (ref 0.3–1.2)
BUN: 26 mg/dL — ABNORMAL HIGH (ref 6–20)
CHLORIDE: 100 mmol/L — AB (ref 101–111)
CO2: 11 mmol/L — AB (ref 22–32)
Calcium: 7.6 mg/dL — ABNORMAL LOW (ref 8.9–10.3)
Creatinine, Ser: 2.01 mg/dL — ABNORMAL HIGH (ref 0.44–1.00)
GFR calc Af Amer: 38 mL/min — ABNORMAL LOW (ref >60)
GFR, EST NON AFRICAN AMERICAN: 32 mL/min — AB (ref >60)
GLUCOSE: 82 mg/dL (ref 65–99)
POTASSIUM: 4.4 mmol/L (ref 3.5–5.1)
Sodium: 130 mmol/L — ABNORMAL LOW (ref 135–145)
Total Protein: 5.6 g/dL — ABNORMAL LOW (ref 6.5–8.1)

## 2018-01-16 LAB — TYPE AND SCREEN
UNIT DIVISION: 0
UNIT DIVISION: 0

## 2018-01-16 LAB — BPAM FFP
Blood Product Expiration Date: 201906062359
Blood Product Expiration Date: 201906072359
ISSUE DATE / TIME: 201905251648
ISSUE DATE / TIME: 201905251648
UNIT TYPE AND RH: 6200
UNIT TYPE AND RH: 6200

## 2018-01-16 LAB — I-STAT CHEM 8, ED
BUN: 32 mg/dL — AB (ref 6–20)
Calcium, Ion: 0.91 mmol/L — ABNORMAL LOW (ref 1.15–1.40)
Chloride: 102 mmol/L (ref 101–111)
Creatinine, Ser: 1.9 mg/dL — ABNORMAL HIGH (ref 0.44–1.00)
Glucose, Bld: 87 mg/dL (ref 65–99)
HCT: 48 % — ABNORMAL HIGH (ref 36.0–46.0)
Hemoglobin: 16.3 g/dL — ABNORMAL HIGH (ref 12.0–15.0)
Potassium: 4.7 mmol/L (ref 3.5–5.1)
Sodium: 128 mmol/L — ABNORMAL LOW (ref 135–145)
TCO2: 11 mmol/L — ABNORMAL LOW (ref 22–32)

## 2018-01-16 LAB — I-STAT CG4 LACTIC ACID, ED
Lactic Acid, Venous: 3.71 mmol/L (ref 0.5–1.9)
Lactic Acid, Venous: 4.41 mmol/L (ref 0.5–1.9)

## 2018-01-16 LAB — BPAM RBC
Blood Product Expiration Date: 201906172359
Blood Product Expiration Date: 201906182359
ISSUE DATE / TIME: 201905251647
ISSUE DATE / TIME: 201905251647
Unit Type and Rh: 9500
Unit Type and Rh: 9500

## 2018-01-16 LAB — MAGNESIUM: MAGNESIUM: 0.9 mg/dL — AB (ref 1.7–2.4)

## 2018-01-16 LAB — LIPASE, BLOOD: LIPASE: 2211 U/L — AB (ref 11–51)

## 2018-01-16 LAB — CBG MONITORING, ED: GLUCOSE-CAPILLARY: 75 mg/dL (ref 65–99)

## 2018-01-16 MED ORDER — ONDANSETRON HCL 4 MG/2ML IJ SOLN
4.0000 mg | Freq: Once | INTRAMUSCULAR | Status: AC
  Administered 2018-01-16: 4 mg via INTRAVENOUS
  Filled 2018-01-16: qty 2

## 2018-01-16 MED ORDER — DEXTROSE-NACL 5-0.9 % IV SOLN
INTRAVENOUS | Status: DC
  Administered 2018-01-16 – 2018-01-17 (×2): via INTRAVENOUS

## 2018-01-16 MED ORDER — SODIUM CHLORIDE 0.9 % IV BOLUS
1000.0000 mL | Freq: Once | INTRAVENOUS | Status: AC
  Administered 2018-01-16: 1000 mL via INTRAVENOUS

## 2018-01-16 MED ORDER — ONDANSETRON HCL 4 MG PO TABS: 4.0000 mg | ORAL_TABLET | Freq: Four times a day (QID) | ORAL | Status: DC | PRN

## 2018-01-16 MED ORDER — ONDANSETRON HCL 4 MG/2ML IJ SOLN: 4.0000 mg | Freq: Four times a day (QID) | INTRAMUSCULAR | Status: DC | PRN

## 2018-01-16 MED ORDER — SODIUM CHLORIDE 0.9 % IV SOLN
Freq: Once | INTRAVENOUS | Status: AC
  Administered 2018-01-16: 16:00:00 via INTRAVENOUS

## 2018-01-16 MED ORDER — PIPERACILLIN-TAZOBACTAM 3.375 G IVPB 30 MIN
3.3750 g | Freq: Once | INTRAVENOUS | Status: AC
  Administered 2018-01-16: 3.375 g via INTRAVENOUS
  Filled 2018-01-16: qty 50

## 2018-01-16 MED ORDER — MORPHINE SULFATE (PF) 4 MG/ML IV SOLN
4.0000 mg | Freq: Once | INTRAVENOUS | Status: AC
  Administered 2018-01-16: 4 mg via INTRAVENOUS
  Filled 2018-01-16: qty 1

## 2018-01-16 MED ORDER — DEXAMETHASONE SODIUM PHOSPHATE 10 MG/ML IJ SOLN
10.0000 mg | Freq: Once | INTRAMUSCULAR | Status: AC
  Administered 2018-01-16: 10 mg via INTRAVENOUS
  Filled 2018-01-16: qty 1

## 2018-01-16 MED ORDER — MORPHINE SULFATE (PF) 4 MG/ML IV SOLN
4.0000 mg | INTRAVENOUS | Status: DC | PRN
  Administered 2018-01-16: 4 mg via INTRAVENOUS
  Filled 2018-01-16: qty 1

## 2018-01-16 MED ORDER — PIPERACILLIN-TAZOBACTAM 3.375 G IVPB
3.3750 g | Freq: Three times a day (TID) | INTRAVENOUS | Status: DC
Start: 2018-01-16 — End: 2018-01-26
  Administered 2018-01-17 – 2018-01-26 (×29): 3.375 g via INTRAVENOUS
  Filled 2018-01-16 (×30): qty 50

## 2018-01-16 MED ORDER — LACTATED RINGERS IV BOLUS (SEPSIS)
1000.0000 mL | Freq: Once | INTRAVENOUS | Status: AC
  Administered 2018-01-16: 1000 mL via INTRAVENOUS

## 2018-01-16 NOTE — ED Notes (Signed)
Admitting made aware of pt vitals, sts it's due to pt dx and only thing to do is give fluids and monitor.

## 2018-01-16 NOTE — ED Notes (Signed)
Pt in bathroom,

## 2018-01-16 NOTE — ED Notes (Signed)
Unable to obtain BP, SaO2 due to poor purfusion despite multiple attempts.  Pt answering questions.  [facility] notified of pt transport; [facility] unavailable.

## 2018-01-16 NOTE — ED Notes (Signed)
Called no answer

## 2018-01-16 NOTE — ED Triage Notes (Signed)
C/O bilat flank pain and generalized abd pain constantly x 3 days without fever.  C/O vomiting & polyuria.

## 2018-01-16 NOTE — ED Notes (Signed)
IV attempted x 3 unsuccessful. MD remains at bedside and accessing EJ and attempting IV with U/S

## 2018-01-16 NOTE — ED Notes (Signed)
Dr. Donnald Garre attempted for [place] IV left upper forearm without success.  Attempting for right femoral stick for blood draw at this time

## 2018-01-16 NOTE — H&P (Signed)
History and Physical    Donna Short:[id number] DOB: 1991-05-09 DOA: 01/16/2018  Referring MD/NP/PA: Alveria Apley, PA-C  PCP: Patient, No Pcp Per   Outpatient Specialists: None   Patient coming from: Home  Chief Complaint: Abdominal pain nausea vomiting  HPI: Donna Short is a 27 y.o. female with medical history significant of seizure disorder with last seizure last year. Patient has been off medications. Patient presented to the ER with significant abdominal pain nausea and vomiting. She also has generalized diaphoresis. No diarrhea. She had rash in her face after initial assessment in the ER which has disappeared. She was found to have acute pancreatitis with acute kidney injury and significant dehydration. Patient has had binge drinking last Sunday. She did not have any prior history of pancreatitis. She has no evidence of acute gallstones. No other medications. She has significantly elevated lipase. Her Ranson'scriteria was high. She is being admitted for treatment   ED Course: Patient has lactic acid of 4.41, temperature 97.9. Blood pressure initially 68/45 heart rate of 152 with respirations 25. White count is 15.8 with hemoglobin 12.2 and platelets 79. Sodium 1:30, BUN 26 and creatinine 2.01, so 7.6. Lipase is 2211. Magnesium is 0.9 and platelets 79. CT abdomen and pelvis shows findings consistent with acute pancreatitis. Also compression sludge in gallbladder but no thickening of the wall.  Review of Systems: As per HPI otherwise 10 point review of systems negative.    Past Medical History:  Diagnosis Date  . Asthma   . Hypertension   . Seizures (HCC)     Past Surgical History:  Procedure Laterality Date  . NO PAST SURGERIES       reports that she has quit smoking. Her smoking use included cigarettes. She uses smokeless tobacco. She reports that she does not drink alcohol or use drugs.  Allergies  Allergen Reactions  . Benadryl [Diphenhydramine Hcl  (Sleep)] Rash  . Latex Rash  . Penicillins Itching and Rash    Has patient had a PCN reaction causing immediate rash, facial/tongue/throat swelling, SOB or lightheadedness with hypotension: Yes Has patient had a PCN reaction causing severe rash involving mucus membranes or skin necrosis: No Has patient had a PCN reaction that required hospitalization: Yes Has patient had a PCN reaction occurring within the last 10 years: No If all of the above answers are "NO", then may proceed with Cephalosporin use.;  . Shrimp [Shellfish Allergy] Hives and Rash    Family History  Problem Relation Age of Onset  . Diabetes Father   . Breast cancer Mother     Prior to Admission medications   Medication Sig Start Date End Date Taking? Authorizing Provider  aspirin-acetaminophen-caffeine (EXCEDRIN MIGRAINE) [phone] MG tablet Take 2 tablets by mouth every 6 (six) hours as needed for headache or migraine.   Yes [provider]  ondansetron (ZOFRAN-ODT) 4 MG disintegrating tablet Take 1 tablet (4 mg total) by mouth every 8 (eight) hours as needed for nausea or vomiting. 01/12/18  Yes Mardella Layman, MD  lamoTRIgine (LAMICTAL) 100 MG tablet Take 1 tablet (100 mg total) by mouth 2 (two) times daily. Patient not taking: Reported on 01/16/2018 01/22/16   Levert Feinstein, MD    Physical Exam: Vitals:   01/16/18 1715 01/16/18 1800 01/16/18 1810 01/16/18 1830  BP: (!) 155/122   (!) 122/0  Pulse: (!) 131 (!) 130    Resp: (!) 23 (!) 24    Temp:   97.9 F (36.6 C)   TempSrc:  Oral   SpO2: 100% 99%        Constitutional: NAD, calm, comfortable Vitals:   01/16/18 1715 01/16/18 1800 01/16/18 1810 01/16/18 1830  BP: (!) 155/122   (!) 122/0  Pulse: (!) 131 (!) 130    Resp: (!) 23 (!) 24    Temp:   97.9 F (36.6 C)   TempSrc:   Oral   SpO2: 100% 99%     Eyes: PERRL, lids and conjunctivae normal ENMT: Mucous membranes are moist. Posterior pharynx clear of any exudate or lesions.Normal dentition.    Neck: normal, supple, no masses, no thyromegaly Respiratory: clear to auscultation bilaterally, no wheezing, no crackles. Normal respiratory effort. No accessory muscle use.  Cardiovascular: Regular rate and rhythm, no murmurs / rubs / gallops. No extremity edema. 2+ pedal pulses. No carotid bruits.  Abdomen: Full abdomen with diffuse N Dennis especially epigastric region, no organomegaly Musculoskeletal: no clubbing / cyanosis. No joint deformity upper and lower extremities. Good ROM, no contractures. Normal muscle tone.  Skin: no rashes, lesions, ulcers. No induration Neurologic: CN 2-12 grossly intact. Sensation intact, DTR normal. Strength 5/5 in all 4.  Psychiatric: Normal judgment and insight. Alert and oriented x 3. Normal mood.   Labs on Admission: I have personally reviewed following labs and imaging studies  CBC: Recent Labs  Lab 01/16/18 1700 01/16/18 1728  WBC  --  15.8*  NEUTROABS  --  13.5*  HGB 16.3* 12.2  HCT 48.0* 34.9*  MCV  --  107.4*  PLT  --  79*   Basic Metabolic Panel: Recent Labs  Lab 01/16/18 1700 01/16/18 1728  NA 128* 130*  K 4.7 4.4  CL 102 100*  CO2  --  11*  GLUCOSE 87 82  BUN 32* 26*  CREATININE 1.90* 2.01*  CALCIUM  --  7.6*   GFR: CrCl cannot be calculated (Unknown ideal weight.). Liver Function Tests: Recent Labs  Lab 01/16/18 1728  AST 54*  ALT 25  ALKPHOS 37*  BILITOT 1.2  PROT 5.6*  ALBUMIN 2.7*   Recent Labs  Lab 01/16/18 1728  LIPASE 2,211*   No results for input(s): AMMONIA in the last 168 hours. Coagulation Profile: No results for input(s): INR, PROTIME in the last 168 hours. Cardiac Enzymes: No results for input(s): CKTOTAL, CKMB, CKMBINDEX, TROPONINI in the last 168 hours. BNP (last 3 results) No results for input(s): PROBNP in the last 8760 hours. HbA1C: No results for input(s): HGBA1C in the last 72 hours. CBG: Recent Labs  Lab 01/16/18 1655  GLUCAP 75   Lipid Profile: No results for input(s): CHOL,  HDL, LDLCALC, TRIG, CHOLHDL, LDLDIRECT in the last 72 hours. Thyroid Function Tests: No results for input(s): TSH, T4TOTAL, FREET4, T3FREE, THYROIDAB in the last 72 hours. Anemia Panel: No results for input(s): VITAMINB12, FOLATE, FERRITIN, TIBC, IRON, RETICCTPCT in the last 72 hours. Urine analysis:    Component Value Date/Time   COLORURINE YELLOW 05/03/2015 0040   APPEARANCEUR CLOUDY (A) 05/03/2015 0040   LABSPEC >=1.030 10/07/2015 1958   PHURINE 6.5 10/07/2015 1958   GLUCOSEU NEGATIVE 10/07/2015 1958   HGBUR MODERATE (A) 10/07/2015 1958   BILIRUBINUR NEGATIVE 10/07/2015 1958   KETONESUR 80 (A) 10/07/2015 1958   PROTEINUR >=300 (A) 10/07/2015 1958   UROBILINOGEN 0.2 10/07/2015 1958   NITRITE NEGATIVE 10/07/2015 1958   LEUKOCYTESUR NEGATIVE 10/07/2015 1958   Sepsis Labs: (procalcitonin:4,lacticidven:4) )No results found for this or any previous visit (from the past 240 hour(s)).   Radiological Exams on Admission:  Ct Abdomen Pelvis Wo Contrast  Result Date: 01/16/2018 CLINICAL DATA:  Abdominal pain radiating to back EXAM: CT ABDOMEN AND PELVIS WITHOUT CONTRAST TECHNIQUE: Multidetector CT imaging of the abdomen and pelvis was performed following the standard protocol without oral or IV contrast. COMPARISON:  None. FINDINGS: Lower chest: Lung bases are clear. Hepatobiliary: There is a patent steatosis. No focal liver lesions are appreciable. Gallbladder wall is not appreciably thickened. There is suspected sludge in gallbladder. There is no biliary duct dilatation. Pancreas: There is mild generalized prominence of the pancreas with what is felt to be a degree edema throughout the pancreas. There is surrounding peripancreatic fluid. Fluid tracks on the right laterally into the right lateral conal fascia region. There is loculated ascites throughout the anterior abdominal region with mesenteric thickening throughout the anterolateral abdominal wall regions extending inferior to the  level of the pelvis. There is moderate ascites in the dependent portion of the pelvis as well which may partially loculated. Spleen: No splenic lesions are evident. Adrenals/Urinary Tract: Adrenals bilaterally appear unremarkable. Kidneys bilaterally show no evident mass or hydronephrosis on either side. There is no renal or ureteral calculus on either side. Urinary bladder is midline with wall thickness within normal limits. Stomach/Bowel: There is mild wall thickening in multiple loops of small bowel, likely due to enteritis secondary to the pancreatitis. Similar changes are noted in the ascending and descending colon. There is no evident bowel obstruction. No free air or portal venous air. Vascular/Lymphatic: There is no abdominal aortic aneurysm. No vascular lesions are evident. There is no appreciable adenopathy in the abdomen or pelvis. Reproductive: Uterus is anteverted.  No evident pelvic mass. Other: Appendix appears normal. There is no evident abscess in the abdomen or pelvis. Musculoskeletal: No blastic or lytic bone lesions. No intramuscular or abdominal wall lesions are evident. IMPRESSION: 1. Findings indicative of acute pancreatitis. Pancreas appears edematous with peripancreatic fluid. There is fluid tracking throughout the anterolateral abdominal regions with mesenteric thickening in these areas felt to be due to pancreatitis. There is localized partially loculated ascites in the dependent portion of the pelvis. 2. Wall thickening involving multiple loops of small and large bowel, likely due to enterocolitis secondary to inflammation from the peripancreatic fluid/pancreatitis. No bowel obstruction. 3. No frank abscess in the abdomen or pelvis. Appendix appears normal. 4.  No renal or ureteral calculus.  No hydronephrosis. 5. Question sludge in gallbladder. Gallbladder wall does not appear appreciably thickened. 6.  Hepatic steatosis. Electronically Signed   By: Bretta Bang III M.D.   On:  01/16/2018 18:08     Assessment/Plan Principal Problem:   Pancreatitis, acute Active Problems:   Hyponatremia   ARF (acute renal failure) (HCC)   Pancreatitis, alcoholic, acute    #1 acute pancreatitis: Most likely alcoholic pancreatitis. Patient will be admitted to stepdown unit due to significant SIRS. Aggressive hydration and bowel rest. Pain management. Nausea vomiting management. Counseling to be provided. Ringer's lactate to be used after initial D5 normal saline  #2 acute kidney injury: Most likely prerenal due to dehydration. Follow renal function after aggressive hydration.  #3 hyponatremia: Most likely secondary to dehydration. Continue to monitor.  #4 hypomagnesemia: Suspect alcoholism despite patient's denial. We'll replete magnesium.  #5 leukocytosis: Secondary to acute pancreatitis. Continue monitoring.  #6 thrombocytopenia: Suspect secondary to alcohol abuse. Monitor platelets  #7 seizure disorder: She's been off medications for a year. No recent seizures.  #8 alcohol abuse: [place] drinking reported by patient. Observe patient for possible withdrawals.  DVT  prophylaxis: SCD   Code Status: Full   Family Communication: God-father at bedside   Disposition Plan: Home   Consults called: None   Admission status: inpatient   Severity of Illness: The appropriate patient status for this patient is INPATIENT. Inpatient status is judged to be reasonable and necessary in order to provide the required intensity of service to ensure the patient's safety. The patient's presenting symptoms, physical exam findings, and initial radiographic and laboratory data in the context of their chronic comorbidities is felt to place them at high risk for further clinical deterioration. Furthermore, it is not anticipated that the patient will be medically stable for discharge from the hospital within 2 midnights of admission. The following factors support the patient status of inpatient.    " The patient's presenting symptoms include abdominal pain nausea with vomiting. " The worrisome physical exam findings include abdominal tenderness. " The initial radiographic and laboratory data are worrisome because of evidence of pancreatitis and lipase of more than 2000. " The chronic co-morbidities include alcohol abuse.   * I certify that at the point of admission it is my clinical judgment that the patient will require inpatient hospital care spanning beyond 2 midnights from the point of admission due to high intensity of service, high risk for further deterioration and high frequency of surveillance required.Lonia Blood MD [facility] Pager [phone]  If 7PM-7AM, please contact night-coverage [email or web address] Password [facility]  01/16/2018, 7:48 PM

## 2018-01-16 NOTE — Progress Notes (Signed)
Pharmacy Antibiotic Note  Donna Short is a 27 y.o. female admitted on 01/16/2018 with sepsis.  Pharmacy has been consulted for Zosyn dosing. Labs pending at this time. Afebrile, HR 111, Resp rate 24  Plan: Zosyn 3.375g IV q8h (4 hour infusion). Monitor clinical progression and LOT     Temp (24hrs), Avg:98.2 F (36.8 C), Min:98.2 F (36.8 C), Max:98.2 F (36.8 C)  No results for input(s): WBC, CREATININE, LATICACIDVEN, VANCOTROUGH, VANCOPEAK, VANCORANDOM, GENTTROUGH, GENTPEAK, GENTRANDOM, TOBRATROUGH, TOBRAPEAK, TOBRARND, AMIKACINPEAK, AMIKACINTROU, AMIKACIN in the last 168 hours.  CrCl cannot be calculated (Patient's most recent lab result is older than the maximum 21 days allowed.).    Allergies  Allergen Reactions  . Benadryl [Diphenhydramine Hcl (Sleep)] Rash  . Latex Rash  . Shrimp [Shellfish Allergy] Rash    Hives     Thank you for allowing pharmacy to be a part of this patient's care.  Toniann Fail Yusuke Beza 01/16/2018 4:44 PM

## 2018-01-16 NOTE — ED Provider Notes (Signed)
[facility] EMERGENCY DEPARTMENT Provider Note   CSN: [id number] Arrival date & time: 01/16/18  1627     History   Chief Complaint Chief Complaint  Patient presents with  . Abdominal Pain    HPI Donna Short is a 27 y.o. female presenting for evaluation of abdominal pain.  Patient states that she developed abdominal pain acutely last night around 11 PM when she was leaning over the bed throwing up.  She has been having nausea since Tuesday, was evaluated and given antiemetics.  She has been unable to tolerate anything p.o. since Wednesday.  Abdominal pain is generalized, worse in the right lower quadrant.  It also radiates to the low back bilaterally.  She states her abdomen feels swollen and bloated.  She is on the Depakote shot, last shot was approximately 3 months ago.  She denies fevers, chills, chest pain, shortness of breath, cough, urinary symptoms.  She had diarrhea on Tuesday, but this resolved.  She is now having normal bowel movements.  She denies chance of being pregnant.  She denies vaginal discharge.  She denies other medical problems, states she is only taking the antiemetic.  HPI  Past Medical History:  Diagnosis Date  . Asthma   . Hypertension   . Seizures [facility])     Patient Active Problem List   Diagnosis Date Noted  . Seizures (HCC) 12/19/2015    Past Surgical History:  Procedure Laterality Date  . NO PAST SURGERIES       OB History   None      Home Medications    Prior to Admission medications   Medication Sig Start Date End Date Taking? Authorizing Provider  aspirin-acetaminophen-caffeine (EXCEDRIN MIGRAINE) [phone] MG tablet Take 2 tablets by mouth every 6 (six) hours as needed for headache or migraine.   Yes [provider]  ondansetron (ZOFRAN-ODT) 4 MG disintegrating tablet Take 1 tablet (4 mg total) by mouth every 8 (eight) hours as needed for nausea or vomiting. 01/12/18  Yes Mardella Layman, MD  lamoTRIgine  (LAMICTAL) 100 MG tablet Take 1 tablet (100 mg total) by mouth 2 (two) times daily. Patient not taking: Reported on 01/16/2018 01/22/16   Levert Feinstein, MD    Family History Family History  Problem Relation Age of Onset  . Diabetes Father   . Breast cancer Mother     Social History Social History   Tobacco Use  . Smoking status: Former Smoker    Types: Cigarettes  . Smokeless tobacco: Current User  Substance Use Topics  . Alcohol use: No    Alcohol/week: 0.0 oz  . Drug use: No     Allergies   Benadryl [diphenhydramine hcl (sleep)]; Latex; Penicillins; and Shrimp [shellfish allergy]   Review of Systems Review of Systems  Gastrointestinal: Positive for abdominal pain, diarrhea (resolved), nausea and vomiting.  All other systems reviewed and are negative.    Physical Exam Updated Vital Signs BP (!) 122/0 (BP Location: Right Arm)   Pulse (!) 130   Temp 97.9 F (36.6 C) (Oral)   Resp (!) 24   SpO2 99%   Physical Exam  Constitutional: She is oriented to person, place, and time.  Patient acutely ill.  Cool and diaphoretic to touch  HENT:  Head: Normocephalic and atraumatic.  Eyes: Pupils are equal, round, and reactive to light. EOM are normal.  Neck: Normal range of motion. Neck supple.  Cardiovascular: Regular rhythm.  Tachycardic around 140. Cool and diaphoretic extremities.  Radial  and pedal pulses palpable but thready.  Pulmonary/Chest: Effort normal and breath sounds normal. No respiratory distress. She has no wheezes.  Clear lung sounds in all fields.  Speaking in full sentences.  Abdominal: She exhibits distension. She exhibits no mass. There is tenderness. There is guarding.  Generalized tenderness to palpation of the abdomen, worse in the right lower quadrant.  Abdomen mildly distended.  Voluntary guarding.  No obvious rebound.  No obvious masses.  Musculoskeletal: Normal range of motion.  Neurological: She is alert and oriented to person, place, and time.    Alert and oriented  Skin: She is diaphoretic.  Psychiatric: She has a normal mood and affect.  Nursing note and vitals reviewed.    ED Treatments / Results  Labs (all labs ordered are listed, but only abnormal results are displayed) Labs Reviewed  COMPREHENSIVE METABOLIC PANEL - Abnormal; Notable for the following components:      Result Value   Sodium 130 (*)    Chloride 100 (*)    CO2 11 (*)    BUN 26 (*)    Creatinine, Ser 2.01 (*)    Calcium 7.6 (*)    Total Protein 5.6 (*)    Albumin 2.7 (*)    AST 54 (*)    Alkaline Phosphatase 37 (*)    GFR calc non Af Amer 32 (*)    GFR calc Af Amer 38 (*)    Anion gap 19 (*)    All other components within normal limits  CBC WITH DIFFERENTIAL/PLATELET - Abnormal; Notable for the following components:   WBC 15.8 (*)    RBC 3.25 (*)    HCT 34.9 (*)    MCV 107.4 (*)    MCH 37.5 (*)    Platelets 79 (*)    Neutro Abs 13.5 (*)    Lymphs Abs 0.6 (*)    Monocytes Absolute 1.4 (*)    All other components within normal limits  LIPASE, BLOOD - Abnormal; Notable for the following components:   Lipase 2,211 (*)    All other components within normal limits  I-STAT CG4 LACTIC ACID, ED - Abnormal; Notable for the following components:   Lactic Acid, Venous 3.71 (*)    All other components within normal limits  I-STAT CHEM 8, ED - Abnormal; Notable for the following components:   Sodium 128 (*)    BUN 32 (*)    Creatinine, Ser 1.90 (*)    Calcium, Ion 0.91 (*)    TCO2 11 (*)    Hemoglobin 16.3 (*)    HCT 48.0 (*)    All other components within normal limits  CULTURE, BLOOD (ROUTINE X 2)  CULTURE, BLOOD (ROUTINE X 2)  URINALYSIS, ROUTINE W REFLEX MICROSCOPIC  I-STAT BETA HCG BLOOD, ED ([facility], [facility], [facility] ONLY)  CBG MONITORING, ED  I-STAT CG4 LACTIC ACID, ED  TYPE AND SCREEN  PREPARE FRESH FROZEN PLASMA    EKG None  Radiology Ct Abdomen Pelvis Wo Contrast  Result Date: 01/16/2018 CLINICAL DATA:  Abdominal pain radiating to back  EXAM: CT ABDOMEN AND PELVIS WITHOUT CONTRAST TECHNIQUE: Multidetector CT imaging of the abdomen and pelvis was performed following the standard protocol without oral or IV contrast. COMPARISON:  None. FINDINGS: Lower chest: Lung bases are clear. Hepatobiliary: There is a patent steatosis. No focal liver lesions are appreciable. Gallbladder wall is not appreciably thickened. There is suspected sludge in gallbladder. There is no biliary duct dilatation. Pancreas: There is mild generalized prominence of the pancreas with  what is felt to be a degree edema throughout the pancreas. There is surrounding peripancreatic fluid. Fluid tracks on the right laterally into the right lateral conal fascia region. There is loculated ascites throughout the anterior abdominal region with mesenteric thickening throughout the anterolateral abdominal wall regions extending inferior to the level of the pelvis. There is moderate ascites in the dependent portion of the pelvis as well which may partially loculated. Spleen: No splenic lesions are evident. Adrenals/Urinary Tract: Adrenals bilaterally appear unremarkable. Kidneys bilaterally show no evident mass or hydronephrosis on either side. There is no renal or ureteral calculus on either side. Urinary bladder is midline with wall thickness within normal limits. Stomach/Bowel: There is mild wall thickening in multiple loops of small bowel, likely due to enteritis secondary to the pancreatitis. Similar changes are noted in the ascending and descending colon. There is no evident bowel obstruction. No free air or portal venous air. Vascular/Lymphatic: There is no abdominal aortic aneurysm. No vascular lesions are evident. There is no appreciable adenopathy in the abdomen or pelvis. Reproductive: Uterus is anteverted.  No evident pelvic mass. Other: Appendix appears normal. There is no evident abscess in the abdomen or pelvis. Musculoskeletal: No blastic or lytic bone lesions. No intramuscular  or abdominal wall lesions are evident. IMPRESSION: 1. Findings indicative of acute pancreatitis. Pancreas appears edematous with peripancreatic fluid. There is fluid tracking throughout the anterolateral abdominal regions with mesenteric thickening in these areas felt to be due to pancreatitis. There is localized partially loculated ascites in the dependent portion of the pelvis. 2. Wall thickening involving multiple loops of small and large bowel, likely due to enterocolitis secondary to inflammation from the peripancreatic fluid/pancreatitis. No bowel obstruction. 3. No frank abscess in the abdomen or pelvis. Appendix appears normal. 4.  No renal or ureteral calculus.  No hydronephrosis. 5. Question sludge in gallbladder. Gallbladder wall does not appear appreciably thickened. 6.  Hepatic steatosis. Electronically Signed   By: Bretta Bang III M.D.   On: 01/16/2018 18:08    Procedures .Critical Care Performed by: Alveria Apley, PA-C Authorized by: Alveria Apley, PA-C   Critical care provider statement:    Critical care time (minutes):  45   Critical care time was exclusive of:  Separately billable procedures and treating other patients and teaching time   Critical care was necessary to treat or prevent imminent or life-threatening deterioration of the following conditions:  Metabolic crisis, sepsis and dehydration   Critical care was time spent personally by me on the following activities:  Blood draw for specimens, development of treatment plan with patient or surrogate, discussions with consultants, evaluation of patient's response to treatment, examination of patient, obtaining history from patient or surrogate, ordering and performing treatments and interventions, ordering and review of laboratory studies, ordering and review of radiographic studies, pulse oximetry, re-evaluation of patient's condition and review of old charts   I assumed direction of critical care for this patient  from another provider in my specialty: no   Comments:     She presented critically ill and septic.  Immediate initiation of fluids, antibiotics, and multiple repeat evaluations.   (including critical care time)  Medications Ordered in ED Medications  piperacillin-tazobactam (ZOSYN) IVPB 3.375 g (has no administration in time range)  sodium chloride 0.9 % bolus 1,000 mL (0 mLs Intravenous Stopped 01/16/18 1720)  lactated ringers bolus 1,000 mL (1,000 mLs Intravenous New Bag/Given 01/16/18 1645)    And  lactated ringers bolus 1,000 mL (0 mLs Intravenous Stopped 01/16/18  1735)  piperacillin-tazobactam (ZOSYN) IVPB 3.375 g (0 g Intravenous Stopped 01/16/18 1823)  ondansetron (ZOFRAN) injection 4 mg (4 mg Intravenous Given 01/16/18 1721)  morphine 4 MG/ML injection 4 mg (4 mg Intravenous Given 01/16/18 1721)  dexamethasone (DECADRON) injection 10 mg (10 mg Intravenous Given 01/16/18 1820)     Initial Impression / Assessment and Plan / ED Course  I have reviewed the triage vital signs and the nursing notes.  Pertinent labs & imaging results that were available during my care of the patient were reviewed by me and considered in my medical decision making (see chart for details).     Patient presenting for evaluation of abdominal pain, nausea, vomiting.  On arrival, patient critically ill with hypotension and tachycardia.  Called code sepsis.  Concern for intra-abdominal pathology/possible surgicalabs, as patient abdomen feels distended and is diffusely tender.  Also consider possible ectopic, despite patient's depo-provera.  Symptoms also consistent with severe dehydration.  Will obtain stat labs, hCG, start fluids and antibiotics.  Discussed with attending immediately, Dr. Donnald Garre evaluated the patient.  Labs show elevated lactic at 3.7, Chem-8 shows AKI.  Hemoglobin stable.  I-STAT hCG negative, doubt ectopic.  As patient has AKI, will order CT abdomen without contrast.   Sepsis reassessment  done.  Patient's heart rate mildly improved to 120 and blood pressure improved ~115 systolic.   Pt developed a rash on her face.  It is not pruritic.  No airway involvement or difficulty breathing.  Patient has a documented history of rash to Benadryl, will give Decadron.  Will avoid epi at this time due to patient's already elevated heart rate.  Rash has improved.  On reassessment patient continues to report no itching or shortness of breath. Pt reports allergy to latex.  White count elevated at 15.8.  Creatinine elevated at 2.01.  Liver and bili reassuring.  Hemoglobin stable.  CT shows pancreatitis with surrounding inflammation.  Shows gallbladder sludge without obvious gallbladder wall thickening or stones.  No sign of free air, appendicitis, or acute surgical abdomen.  Discussing with patient, patient states that her birthday was on Sunday and she drank heavily Sunday night.  She denies chronic alcohol use.  Unknown if triglycerides are elevated.  Will call for admission to hospitalist.  Discussed with hospitalist, patient be admitted to [facility] service.   Final Clinical Impressions(s) / ED Diagnoses   Final diagnoses:  Acute pancreatitis, unspecified complication status, unspecified pancreatitis type  Metabolic acidosis  Dehydration  Sepsis, due to unspecified organism [facility])  AKI (acute kidney injury) [facility])    ED Discharge Orders    None       Alveria Apley, PA-C 01/16/18 Darin Engels, MD 01/18/18 [phone]

## 2018-01-16 NOTE — ED Provider Notes (Signed)
[facility]    CSN: [id number] Arrival date & time: 01/16/18  1446     History   Chief Complaint Chief Complaint  Patient presents with  . Flank Pain  . Abdominal Pain    HPI Donna Short is a 27 y.o. female.   Asked by triage nurse to see patient who was not doing well emergently.  Patient was lying recumbent on triage stretcher she is alert and opens eyes to request.  Unable to palpate radial or brachial pulse.  Carotid pulse very tachycardic.  Large Bore IV ordered with NS Wide Open.  Patient's skin is cool and clammy to touch.  Delayed cap refill noted.  Treated states was unable to get pulse ox or blood pressure machine.  Annual BP was 48 systolic.  Triage nurse states patient's was seen here last Tuesday for nausea vomiting and diagnosed with viral gastroenteritis.  She is complaining of abdominal pain, back pain, nausea and vomiting.  HPI  Past Medical History:  Diagnosis Date  . Asthma   . Hypertension   . Seizures [facility])     Patient Active Problem List   Diagnosis Date Noted  . Seizures (HCC) 12/19/2015    Past Surgical History:  Procedure Laterality Date  . NO PAST SURGERIES      OB History   None      Home Medications    Prior to Admission medications   Medication Sig Start Date End Date Taking? Authorizing Provider  lamoTRIgine (LAMICTAL) 100 MG tablet Take 1 tablet (100 mg total) by mouth 2 (two) times daily. 01/22/16  Yes Levert Feinstein, MD  ondansetron (ZOFRAN-ODT) 4 MG disintegrating tablet Take 1 tablet (4 mg total) by mouth every 8 (eight) hours as needed for nausea or vomiting. 01/12/18  Yes Hagler, Arlys John, MD  ondansetron (ZOFRAN) 4 MG tablet Take 1 tablet (4 mg total) by mouth every 6 (six) hours. 09/19/17   Sharlene Dory, DO    Family History Family History  Problem Relation Age of Onset  . Diabetes Father   . Breast cancer Mother     Social History Social History   Tobacco Use  . Smoking status: Former Smoker   Types: Cigarettes  . Smokeless tobacco: Current User  Substance Use Topics  . Alcohol use: No    Alcohol/week: 0.0 oz  . Drug use: No     Allergies   Benadryl [diphenhydramine hcl (sleep)]; Latex; and Shrimp [shellfish allergy]   Review of Systems Review of Systems  Constitutional: Positive for activity change, appetite change, chills, diaphoresis, fatigue and fever.  HENT: Negative.  Negative for sore throat and trouble swallowing.   Eyes: Negative.   Respiratory: Negative.  Negative for chest tightness and shortness of breath.   Cardiovascular: Negative.   Gastrointestinal: Positive for abdominal distention, abdominal pain, nausea and vomiting.  Endocrine: Negative.   Genitourinary: Positive for flank pain. Negative for dysuria.  Skin: Positive for pallor.  Allergic/Immunologic: Negative.   Neurological: Positive for syncope and light-headedness.  Hematological: Negative.   Psychiatric/Behavioral: Negative.     SEE HPI  Physical Exam Triage Vital Signs ED Triage Vitals  Enc Vitals Group     BP --      Pulse Rate 01/16/18 1552 (!) 111     Resp 01/16/18 1544 (!) 24     Temp 01/16/18 1544 98.2 F (36.8 C)     Temp Source 01/16/18 1544 Temporal     SpO2 --      Weight --  Height --      Head Circumference --      Peak Flow --      Pain Score 01/16/18 1547 10     Pain Loc --      Pain Edu? --      Excl. in GC? --    No data found.  Updated Vital Signs Pulse (!) 111   Temp 98.2 F (36.8 C) (Temporal)   Resp (!) 24     Physical Exam  Constitutional: She appears well-developed and well-nourished. She appears ill. She appears distressed.  Cardiovascular:  Tachycardic.  HR at least 135.  EMS arrived prior to monitor application. See HPI, Patient emergently transferred to [facility] ED for further evaluation.   Pulmonary/Chest: Effort normal and breath sounds normal.  Abdominal: Soft. There is tenderness in the right lower quadrant and periumbilical area. There  is rebound and tenderness at McBurney's point. There is no rigidity.  Abdomen appears slightly distended.  It is not firm or warm to the touch.  Hyperactive bowel sounds noted in the left lower quadrant.  Normoactive sounds noted in left upper quadrant diminished bowel sounds in right upper quadrant.  No bowel sounds heard in right lower quadrant.  Patient has significant tenderness with palpation to umbilicus in the right lower quadrant.  Positive McBurney's point.   Neurological: She is alert.  Skin: Capillary refill takes more than 3 seconds. She is diaphoretic. There is pallor.     UC Treatments / Results  Labs (all labs ordered are listed, but only abnormal results are displayed) Labs Reviewed - No data to display  EKG None  Radiology No results found.  Procedures Procedures (including critical care time)  Medications Ordered in UC Medications  0.9 %  sodium chloride infusion ( Intravenous New Bag/Given 01/16/18 1600)    Initial Impression / Assessment and Plan / UC Course  I have reviewed the triage vital signs and the nursing notes.  Pertinent labs & imaging results that were available during my care of the patient were reviewed by me and considered in my medical decision making (see chart for details).     Concerned for Ruptured Appendix vs. Acute Abdomen, vs. Severe Dehydration secondary to illness.  Dr. Milus Glazier was consulted on plan of care.    Final Clinical Impressions(s) / UC Diagnoses   Final diagnoses:  None   Discharge Instructions   None    ED Prescriptions    None     Controlled Substance Prescriptions [place] Controlled Substance Registry consulted? Not Applicable   Transfer to [facility] ED.    Servando Salina, NP 01/16/18 [phone]

## 2018-01-16 NOTE — ED Triage Notes (Signed)
Per EMS:  Patient c/o rLQ pain, radiation to back, hypotension and tachycardic at [facility].  Sent here via EMS

## 2018-01-16 NOTE — ED Notes (Signed)
Verbal okay given by MD to begin antiobiotics without second set of cultures

## 2018-01-16 NOTE — ED Notes (Signed)
Dr.Pfeiffer made aware of Lactic Acid results at bedside. ED-Lab

## 2018-01-17 ENCOUNTER — Inpatient Hospital Stay (HOSPITAL_COMMUNITY): Payer: Managed Care, Other (non HMO)

## 2018-01-17 DIAGNOSIS — R569 Unspecified convulsions: Secondary | ICD-10-CM | POA: Insufficient documentation

## 2018-01-17 DIAGNOSIS — I361 Nonrheumatic tricuspid (valve) insufficiency: Secondary | ICD-10-CM

## 2018-01-17 DIAGNOSIS — F10230 Alcohol dependence with withdrawal, uncomplicated: Secondary | ICD-10-CM

## 2018-01-17 DIAGNOSIS — A419 Sepsis, unspecified organism: Secondary | ICD-10-CM | POA: Insufficient documentation

## 2018-01-17 DIAGNOSIS — F10930 Alcohol use, unspecified with withdrawal, uncomplicated: Secondary | ICD-10-CM | POA: Insufficient documentation

## 2018-01-17 DIAGNOSIS — K852 Alcohol induced acute pancreatitis without necrosis or infection: Secondary | ICD-10-CM

## 2018-01-17 DIAGNOSIS — E861 Hypovolemia: Secondary | ICD-10-CM

## 2018-01-17 DIAGNOSIS — I9589 Other hypotension: Secondary | ICD-10-CM

## 2018-01-17 DIAGNOSIS — N179 Acute kidney failure, unspecified: Secondary | ICD-10-CM

## 2018-01-17 DIAGNOSIS — E872 Acidosis, unspecified: Secondary | ICD-10-CM | POA: Insufficient documentation

## 2018-01-17 LAB — I-STAT ARTERIAL BLOOD GAS, ED
Acid-base deficit: 14 mmol/L — ABNORMAL HIGH (ref 0.0–2.0)
Bicarbonate: 11.1 mmol/L — ABNORMAL LOW (ref 20.0–28.0)
O2 SAT: 96 %
PCO2 ART: 23.7 mmHg — AB (ref 32.0–48.0)
PH ART: 7.278 — AB (ref 7.350–7.450)
PO2 ART: 91 mmHg (ref 83.0–108.0)
Patient temperature: 98.4
TCO2: 12 mmol/L — AB (ref 22–32)

## 2018-01-17 LAB — LACTIC ACID, PLASMA
LACTIC ACID, VENOUS: 4.3 mmol/L — AB (ref 0.5–1.9)
Lactic Acid, Venous: 6.1 mmol/L (ref 0.5–1.9)
Lactic Acid, Venous: 4.3 mmol/L (ref 0.5–1.9)
Lactic Acid, Venous: 3.5 mmol/L (ref 0.5–1.9)

## 2018-01-17 LAB — POCT I-STAT 3, ART BLOOD GAS (G3+)
Acid-base deficit: 14 mmol/L — ABNORMAL HIGH (ref 0.0–2.0)
Acid-base deficit: 12 mmol/L — ABNORMAL HIGH (ref 0.0–2.0)
Acid-base deficit: 9 mmol/L — ABNORMAL HIGH (ref 0.0–2.0)
BICARBONATE: 11.7 mmol/L — AB (ref 20.0–28.0)
Bicarbonate: 13.1 mmol/L — ABNORMAL LOW (ref 20.0–28.0)
Bicarbonate: 15.6 mmol/L — ABNORMAL LOW (ref 20.0–28.0)
O2 Saturation: 99 %
O2 Saturation: 99 %
O2 Saturation: 97 %
PCO2 ART: 26.5 mmHg — AB (ref 32.0–48.0)
PCO2 ART: 28.3 mmHg — AB (ref 32.0–48.0)
PH ART: 7.255 — AB (ref 7.350–7.450)
PH ART: 7.344 — AB (ref 7.350–7.450)
PO2 ART: 169 mmHg — AB (ref 83.0–108.0)
Patient temperature: 99.2
Patient temperature: 97.2
TCO2: 12 mmol/L — ABNORMAL LOW (ref 22–32)
TCO2: 14 mmol/L — ABNORMAL LOW (ref 22–32)
TCO2: 16 mmol/L — ABNORMAL LOW (ref 22–32)
pCO2 arterial: 26.9 mmHg — ABNORMAL LOW (ref 32.0–48.0)
pH, Arterial: 7.298 — ABNORMAL LOW (ref 7.350–7.450)
pO2, Arterial: 139 mmHg — ABNORMAL HIGH (ref 83.0–108.0)
pO2, Arterial: 92 mmHg (ref 83.0–108.0)

## 2018-01-17 LAB — CBG MONITORING, ED: Glucose-Capillary: 217 mg/dL — ABNORMAL HIGH (ref 65–99)

## 2018-01-17 LAB — CBC
HCT: 40.6 % (ref 36.0–46.0)
HEMOGLOBIN: 14.3 g/dL (ref 12.0–15.0)
MCH: 37.5 pg — AB (ref 26.0–34.0)
MCHC: 35.2 g/dL (ref 30.0–36.0)
MCV: 106.6 fL — ABNORMAL HIGH (ref 78.0–100.0)
PLATELETS: 72 10*3/uL — AB (ref 150–400)
RBC: 3.81 MIL/uL — AB (ref 3.87–5.11)
RDW: 11.7 % (ref 11.5–15.5)
WBC: 11.6 10*3/uL — ABNORMAL HIGH (ref 4.0–10.5)

## 2018-01-17 LAB — URINALYSIS, ROUTINE W REFLEX MICROSCOPIC
GLUCOSE, UA: NEGATIVE mg/dL
Ketones, ur: 5 mg/dL — AB
Leukocytes, UA: NEGATIVE
Nitrite: NEGATIVE
PROTEIN: 100 mg/dL — AB
Specific Gravity, Urine: 1.021 (ref 1.005–1.030)
pH: 5 (ref 5.0–8.0)

## 2018-01-17 LAB — COMPREHENSIVE METABOLIC PANEL
ALBUMIN: 2.8 g/dL — AB (ref 3.5–5.0)
ALBUMIN: 3.7 g/dL (ref 3.5–5.0)
ALK PHOS: 20 U/L — AB (ref 38–126)
ALK PHOS: 48 U/L (ref 38–126)
ALT: UNDETERMINED U/L (ref 14–54)
ALT: 28 U/L (ref 14–54)
ALT: 22 U/L (ref 14–54)
ALT: 23 U/L (ref 14–54)
ANION GAP: 12 (ref 5–15)
ANION GAP: 22 — AB (ref 5–15)
AST: 112 U/L — ABNORMAL HIGH (ref 15–41)
AST: 111 U/L — AB (ref 15–41)
AST: 82 U/L — ABNORMAL HIGH (ref 15–41)
AST: 74 U/L — ABNORMAL HIGH (ref 15–41)
Albumin: 2.2 g/dL — ABNORMAL LOW (ref 3.5–5.0)
Albumin: 2.2 g/dL — ABNORMAL LOW (ref 3.5–5.0)
Alkaline Phosphatase: 33 U/L — ABNORMAL LOW (ref 38–126)
Alkaline Phosphatase: 22 U/L — ABNORMAL LOW (ref 38–126)
Anion gap: 16 — ABNORMAL HIGH (ref 5–15)
Anion gap: 13 (ref 5–15)
BUN: 26 mg/dL — ABNORMAL HIGH (ref 6–20)
BUN: 27 mg/dL — AB (ref 6–20)
BUN: 23 mg/dL — ABNORMAL HIGH (ref 6–20)
BUN: 28 mg/dL — ABNORMAL HIGH (ref 6–20)
CALCIUM: 7.4 mg/dL — AB (ref 8.9–10.3)
CALCIUM: 6.1 mg/dL — AB (ref 8.9–10.3)
CALCIUM: 8.3 mg/dL — AB (ref 8.9–10.3)
CHLORIDE: 112 mmol/L — AB (ref 101–111)
CHLORIDE: 104 mmol/L (ref 101–111)
CO2: 9 mmol/L — ABNORMAL LOW (ref 22–32)
CO2: 14 mmol/L — ABNORMAL LOW (ref 22–32)
CO2: 10 mmol/L — AB (ref 22–32)
CO2: 18 mmol/L — AB (ref 22–32)
CREATININE: 1.15 mg/dL — AB (ref 0.44–1.00)
CREATININE: 1.75 mg/dL — AB (ref 0.44–1.00)
Calcium: 5.9 mg/dL — CL (ref 8.9–10.3)
Chloride: 100 mmol/L — ABNORMAL LOW (ref 101–111)
Chloride: 101 mmol/L (ref 101–111)
Creatinine, Ser: 1.6 mg/dL — ABNORMAL HIGH (ref 0.44–1.00)
Creatinine, Ser: 1.18 mg/dL — ABNORMAL HIGH (ref 0.44–1.00)
GFR calc Af Amer: 44 mL/min — ABNORMAL LOW (ref >60)
GFR calc Af Amer: 49 mL/min — ABNORMAL LOW (ref >60)
GFR calc Af Amer: >60 mL/min (ref >60)
GFR calc non Af Amer: 38 mL/min — ABNORMAL LOW (ref >60)
GFR calc non Af Amer: 43 mL/min — ABNORMAL LOW (ref >60)
GFR calc non Af Amer: >60 mL/min (ref >60)
GLUCOSE: 206 mg/dL — AB (ref 65–99)
Glucose, Bld: 158 mg/dL — ABNORMAL HIGH (ref 65–99)
Glucose, Bld: 162 mg/dL — ABNORMAL HIGH (ref 65–99)
Glucose, Bld: 213 mg/dL — ABNORMAL HIGH (ref 65–99)
POTASSIUM: 5.2 mmol/L — AB (ref 3.5–5.1)
POTASSIUM: 3.8 mmol/L (ref 3.5–5.1)
Potassium: 4.9 mmol/L (ref 3.5–5.1)
Potassium: 3.7 mmol/L (ref 3.5–5.1)
SODIUM: 131 mmol/L — AB (ref 135–145)
SODIUM: 135 mmol/L (ref 135–145)
SODIUM: 134 mmol/L — AB (ref 135–145)
Sodium: 131 mmol/L — ABNORMAL LOW (ref 135–145)
TOTAL PROTEIN: 5.8 g/dL — AB (ref 6.5–8.1)
TOTAL PROTEIN: 7.3 g/dL (ref 6.5–8.1)
Total Bilirubin: UNDETERMINED mg/dL (ref 0.3–1.2)
Total Bilirubin: 1 mg/dL (ref 0.3–1.2)
Total Bilirubin: 0.8 mg/dL (ref 0.3–1.2)
Total Bilirubin: 0.7 mg/dL (ref 0.3–1.2)
Total Protein: 4.5 g/dL — ABNORMAL LOW (ref 6.5–8.1)
Total Protein: 4.8 g/dL — ABNORMAL LOW (ref 6.5–8.1)

## 2018-01-17 LAB — CBC WITH DIFFERENTIAL/PLATELET
BASOS ABS: 0 10*3/uL (ref 0.0–0.1)
Basophils Relative: 0 %
EOS ABS: 0 10*3/uL (ref 0.0–0.7)
Eosinophils Relative: 0 %
HCT: 31.9 % — ABNORMAL LOW (ref 36.0–46.0)
Hemoglobin: 11.2 g/dL — ABNORMAL LOW (ref 12.0–15.0)
LYMPHS PCT: 4 %
Lymphs Abs: 0.3 10*3/uL — ABNORMAL LOW (ref 0.7–4.0)
MCH: 37.8 pg — ABNORMAL HIGH (ref 26.0–34.0)
MCHC: 35.1 g/dL (ref 30.0–36.0)
MCV: 107.8 fL — ABNORMAL HIGH (ref 78.0–100.0)
Monocytes Absolute: 0.4 10*3/uL (ref 0.1–1.0)
Monocytes Relative: 5 %
NEUTROS PCT: 91 %
Neutro Abs: 8 10*3/uL — ABNORMAL HIGH (ref 1.7–7.7)
PLATELETS: 57 10*3/uL — AB (ref 150–400)
RBC: 2.96 MIL/uL — AB (ref 3.87–5.11)
RDW: 11.9 % (ref 11.5–15.5)
WBC MORPHOLOGY: INCREASED
WBC: 8.7 10*3/uL (ref 4.0–10.5)

## 2018-01-17 LAB — TRIGLYCERIDES: Triglycerides: 133 mg/dL (ref <150)

## 2018-01-17 LAB — PROTIME-INR
INR: 1.36
PROTHROMBIN TIME: 16.7 s — AB (ref 11.4–15.2)

## 2018-01-17 LAB — RAPID URINE DRUG SCREEN, HOSP PERFORMED
Amphetamines: NOT DETECTED
BARBITURATES: NOT DETECTED
BENZODIAZEPINES: NOT DETECTED
COCAINE: NOT DETECTED
Opiates: POSITIVE — AB
Tetrahydrocannabinol: NOT DETECTED

## 2018-01-17 LAB — GLUCOSE, CAPILLARY
GLUCOSE-CAPILLARY: 156 mg/dL — AB (ref 65–99)
GLUCOSE-CAPILLARY: 205 mg/dL — AB (ref 65–99)
GLUCOSE-CAPILLARY: 203 mg/dL — AB (ref 65–99)
GLUCOSE-CAPILLARY: 187 mg/dL — AB (ref 65–99)
Glucose-Capillary: 208 mg/dL — ABNORMAL HIGH (ref 65–99)

## 2018-01-17 LAB — ECHOCARDIOGRAM COMPLETE
HEIGHTINCHES: 62 in
Weight: 1992.96 oz

## 2018-01-17 LAB — HEPATIC FUNCTION PANEL
ALT: 27 U/L (ref 14–54)
AST: 105 U/L — AB (ref 15–41)
Albumin: 2.7 g/dL — ABNORMAL LOW (ref 3.5–5.0)
Alkaline Phosphatase: 34 U/L — ABNORMAL LOW (ref 38–126)
BILIRUBIN DIRECT: 0.4 mg/dL (ref 0.1–0.5)
BILIRUBIN INDIRECT: 0.7 mg/dL (ref 0.3–0.9)
TOTAL PROTEIN: 5.9 g/dL — AB (ref 6.5–8.1)
Total Bilirubin: 1.1 mg/dL (ref 0.3–1.2)

## 2018-01-17 LAB — LIPASE, BLOOD: LIPASE: 1153 U/L — AB (ref 11–51)

## 2018-01-17 LAB — CORTISOL: Cortisol, Plasma: 10.7 ug/dL

## 2018-01-17 LAB — HIV ANTIBODY (ROUTINE TESTING W REFLEX): HIV Screen 4th Generation wRfx: NONREACTIVE

## 2018-01-17 LAB — MRSA PCR SCREENING: MRSA BY PCR: NEGATIVE

## 2018-01-17 LAB — MAGNESIUM: Magnesium: 1 mg/dL — ABNORMAL LOW (ref 1.7–2.4)

## 2018-01-17 LAB — TROPONIN I
Troponin I: <0.03 ng/mL (ref <0.03)
Troponin I: 0.22 ng/mL (ref <0.03)

## 2018-01-17 LAB — PROCALCITONIN: Procalcitonin: 1.16 ng/mL

## 2018-01-17 MED ORDER — SODIUM CHLORIDE 0.9 % IV BOLUS
1000.0000 mL | Freq: Once | INTRAVENOUS | Status: AC
  Administered 2018-01-17: 1000 mL via INTRAVENOUS

## 2018-01-17 MED ORDER — SUCCINYLCHOLINE CHLORIDE 20 MG/ML IJ SOLN
INTRAMUSCULAR | Status: AC | PRN
  Administered 2018-01-17: 100 mg via INTRAVENOUS

## 2018-01-17 MED ORDER — FOLIC ACID 5 MG/ML IJ SOLN
1.0000 mg | Freq: Every day | INTRAMUSCULAR | Status: DC
Start: 2018-01-17 — End: 2018-01-21
  Administered 2018-01-17 – 2018-01-21 (×5): 1 mg via INTRAVENOUS
  Filled 2018-01-17 (×5): qty 0.2

## 2018-01-17 MED ORDER — ORAL CARE MOUTH RINSE
15.0000 mL | OROMUCOSAL | Status: DC
  Administered 2018-01-17 – 2018-01-22 (×51): 15 mL via OROMUCOSAL

## 2018-01-17 MED ORDER — SODIUM BICARBONATE 8.4 % IV SOLN
INTRAVENOUS | Status: DC
  Administered 2018-01-17 – 2018-01-18 (×2): via INTRAVENOUS
  Filled 2018-01-17 (×4): qty 150

## 2018-01-17 MED ORDER — ADULT MULTIVITAMIN W/MINERALS CH: 1.0000 | ORAL_TABLET | Freq: Every day | ORAL | Status: DC

## 2018-01-17 MED ORDER — LACTATED RINGERS IV BOLUS
500.0000 mL | Freq: Once | INTRAVENOUS | Status: AC
  Administered 2018-01-17: 500 mL via INTRAVENOUS

## 2018-01-17 MED ORDER — PROPOFOL 1000 MG/100ML IV EMUL: 5.0000 ug/kg/min | Freq: Once | INTRAVENOUS | Status: DC

## 2018-01-17 MED ORDER — ETOMIDATE 2 MG/ML IV SOLN
INTRAVENOUS | Status: AC | PRN
  Administered 2018-01-17: 10 mg via INTRAVENOUS

## 2018-01-17 MED ORDER — INSULIN ASPART 100 UNIT/ML ~~LOC~~ SOLN
1.0000 [IU] | SUBCUTANEOUS | Status: DC
  Administered 2018-01-17: 2 [IU] via SUBCUTANEOUS
  Administered 2018-01-17: 3 [IU] via SUBCUTANEOUS
  Administered 2018-01-17: 2 [IU] via SUBCUTANEOUS
  Administered 2018-01-17: 3 [IU] via SUBCUTANEOUS
  Administered 2018-01-17: 2 [IU] via SUBCUTANEOUS
  Administered 2018-01-18 (×2): 1 [IU] via SUBCUTANEOUS
  Administered 2018-01-18: 2 [IU] via SUBCUTANEOUS
  Administered 2018-01-19 – 2018-01-21 (×2): 1 [IU] via SUBCUTANEOUS
  Administered 2018-01-21: 2 [IU] via SUBCUTANEOUS
  Administered 2018-01-21 (×2): 1 [IU] via SUBCUTANEOUS
  Administered 2018-01-21 – 2018-01-22 (×4): 2 [IU] via SUBCUTANEOUS

## 2018-01-17 MED ORDER — PANTOPRAZOLE SODIUM 40 MG PO PACK
40.0000 mg | PACK | Freq: Every day | ORAL | Status: DC
  Administered 2018-01-17 – 2018-01-22 (×6): 40 mg
  Filled 2018-01-17 (×6): qty 20

## 2018-01-17 MED ORDER — CHLORHEXIDINE GLUCONATE 0.12% ORAL RINSE (MEDLINE KIT)
15.0000 mL | Freq: Two times a day (BID) | OROMUCOSAL | Status: DC
  Administered 2018-01-17 – 2018-01-22 (×11): 15 mL via OROMUCOSAL

## 2018-01-17 MED ORDER — FAMOTIDINE IN NACL 20-0.9 MG/50ML-% IV SOLN
20.0000 mg | INTRAVENOUS | Status: DC
  Administered 2018-01-17: 20 mg via INTRAVENOUS
  Filled 2018-01-17: qty 50

## 2018-01-17 MED ORDER — PROPOFOL 10 MG/ML IV BOLUS
INTRAVENOUS | Status: AC
  Filled 2018-01-17: qty 20

## 2018-01-17 MED ORDER — LORAZEPAM 2 MG/ML IJ SOLN: 2.0000 mg | INTRAMUSCULAR | Status: DC | PRN

## 2018-01-17 MED ORDER — MAGNESIUM SULFATE 4 GM/100ML IV SOLN
4.0000 g | Freq: Once | INTRAVENOUS | Status: AC
  Administered 2018-01-17: 4 g via INTRAVENOUS
  Filled 2018-01-17: qty 100

## 2018-01-17 MED ORDER — SODIUM CHLORIDE 0.9 % IV SOLN
250.0000 mL | INTRAVENOUS | Status: DC | PRN
  Administered 2018-01-18 – 2018-01-20 (×2): 250 mL via INTRAVENOUS

## 2018-01-17 MED ORDER — FENTANYL 2500MCG IN NS 250ML (10MCG/ML) PREMIX INFUSION
25.0000 ug/h | INTRAVENOUS | Status: DC
  Administered 2018-01-17: 50 ug/h via INTRAVENOUS
  Administered 2018-01-18 – 2018-01-20 (×6): 200 ug/h via INTRAVENOUS
  Administered 2018-01-21 (×2): 300 ug/h via INTRAVENOUS
  Administered 2018-01-21: 325 ug/h via INTRAVENOUS
  Administered 2018-01-22: 300 ug/h via INTRAVENOUS
  Filled 2018-01-17 (×12): qty 250

## 2018-01-17 MED ORDER — VITAMIN B-1 100 MG PO TABS: 100.0000 mg | ORAL_TABLET | Freq: Every day | ORAL | Status: DC

## 2018-01-17 MED ORDER — DEXMEDETOMIDINE HCL IN NACL 200 MCG/50ML IV SOLN
0.0000 ug/kg/h | INTRAVENOUS | Status: AC
  Administered 2018-01-17: 0.5 ug/kg/h via INTRAVENOUS
  Administered 2018-01-17: 0.7 ug/kg/h via INTRAVENOUS
  Administered 2018-01-17: 0.4 ug/kg/h via INTRAVENOUS
  Administered 2018-01-18 (×4): 0.8 ug/kg/h via INTRAVENOUS
  Administered 2018-01-19 (×2): 1.2 ug/kg/h via INTRAVENOUS
  Administered 2018-01-19 (×2): 0.9 ug/kg/h via INTRAVENOUS
  Administered 2018-01-19 (×2): 1.2 ug/kg/h via INTRAVENOUS
  Administered 2018-01-19: 0.9 ug/kg/h via INTRAVENOUS
  Administered 2018-01-20 (×2): 1.2 ug/kg/h via INTRAVENOUS
  Filled 2018-01-17 (×14): qty 50
  Filled 2018-01-17: qty 100
  Filled 2018-01-17 (×3): qty 50

## 2018-01-17 MED ORDER — LORAZEPAM 2 MG/ML IJ SOLN
2.0000 mg | INTRAMUSCULAR | Status: DC | PRN
  Administered 2018-01-17 (×2): 2 mg via INTRAVENOUS
  Filled 2018-01-17 (×2): qty 1

## 2018-01-17 MED ORDER — PROPOFOL 1000 MG/100ML IV EMUL
INTRAVENOUS | Status: AC
  Filled 2018-01-17: qty 100

## 2018-01-17 MED ORDER — SODIUM CHLORIDE 0.9 % IV SOLN: INTRAVENOUS | Status: DC | PRN

## 2018-01-17 MED ORDER — SODIUM CHLORIDE 0.9 % IV BOLUS
1000.0000 mL | Freq: Once | INTRAVENOUS | Status: AC
Start: 2018-01-17 — End: 2018-01-17
  Administered 2018-01-17: 1000 mL via INTRAVENOUS

## 2018-01-17 MED ORDER — FOLIC ACID 1 MG PO TABS: 1.0000 mg | ORAL_TABLET | Freq: Every day | ORAL | Status: DC

## 2018-01-17 MED ORDER — FENTANYL CITRATE (PF) 100 MCG/2ML IJ SOLN: 50.0000 ug | Freq: Once | INTRAMUSCULAR | Status: DC

## 2018-01-17 MED ORDER — HEPARIN SODIUM (PORCINE) 5000 UNIT/ML IJ SOLN: 5000.0000 [IU] | Freq: Three times a day (TID) | INTRAMUSCULAR | Status: DC

## 2018-01-17 MED ORDER — LORAZEPAM 2 MG/ML IJ SOLN
1.0000 mg | INTRAMUSCULAR | Status: DC | PRN
  Administered 2018-01-19 – 2018-01-20 (×2): 2 mg via INTRAVENOUS
  Filled 2018-01-17 (×2): qty 1

## 2018-01-17 MED ORDER — THIAMINE HCL 100 MG/ML IJ SOLN
100.0000 mg | Freq: Every day | INTRAMUSCULAR | Status: DC
  Administered 2018-01-17 – 2018-01-21 (×5): 100 mg via INTRAVENOUS
  Filled 2018-01-17 (×5): qty 2

## 2018-01-17 MED ORDER — NOREPINEPHRINE 4 MG/250ML-% IV SOLN
0.0000 ug/min | INTRAVENOUS | Status: DC
  Administered 2018-01-17: 2 ug/min via INTRAVENOUS
  Administered 2018-01-18: 3 ug/min via INTRAVENOUS
  Filled 2018-01-17 (×2): qty 250

## 2018-01-17 MED ORDER — FENTANYL BOLUS VIA INFUSION
50.0000 ug | INTRAVENOUS | Status: DC | PRN
  Administered 2018-01-17 – 2018-01-21 (×8): 50 ug via INTRAVENOUS
  Filled 2018-01-17: qty 50

## 2018-01-17 MED ORDER — METOPROLOL TARTRATE 5 MG/5ML IV SOLN: 5.0000 mg | INTRAVENOUS | Status: DC | PRN

## 2018-01-17 MED ORDER — SODIUM CHLORIDE 0.9 % IV SOLN
1.0000 g | Freq: Once | INTRAVENOUS | Status: AC
  Administered 2018-01-17: 1 g via INTRAVENOUS
  Filled 2018-01-17: qty 10

## 2018-01-17 MED ORDER — LEVETIRACETAM IN NACL 500 MG/100ML IV SOLN
500.0000 mg | Freq: Two times a day (BID) | INTRAVENOUS | Status: DC
  Administered 2018-01-17 – 2018-01-25 (×17): 500 mg via INTRAVENOUS
  Filled 2018-01-17 (×17): qty 100

## 2018-01-17 MED ORDER — LACTATED RINGERS IV SOLN
INTRAVENOUS | Status: DC
  Administered 2018-01-17: 08:00:00 via INTRAVENOUS

## 2018-01-17 NOTE — ED Provider Notes (Signed)
Intubation requested by admitting team as patient has decompensated in the ER awaiting her bed. PCCM contacted by admitting team.   Orders per primary team and PCCM   Donna Bilis, MD 01/17/18 [phone]

## 2018-01-17 NOTE — Progress Notes (Signed)
Spoke with Rn re PICC request.  Recommended to wait until blood cultures are negative x 48 hrs and need for long term antibiotics confirmed.  Pt has 2 PIV working well at this time.

## 2018-01-17 NOTE — Progress Notes (Signed)
Critical ABG values given to S. Alexandria Lodge, NP and RN at [phone].  Vent changes made following ABG results.

## 2018-01-17 NOTE — Progress Notes (Signed)
Received phone call from bedside RN stating pt was found foaming at the mouth and unresponsive in the bed. RN with concerns that patient has become less responsive throughout the night and had a seizure d/t alcohol withdrawal. Bedside assessment reveals pt opens eyes to verbal command but is still lethargic and frothy saliva is noted. Pt is tachypneic, tachycardiac (HR 150-160's) and hypertensive. After review of chart it is noted that pt has hx of seizures but has been off medication for approximately a year. It is unclear if pt will be able to continue to protect her airway. Will consult CCM for further management   Alcohol withdrawal/seizures -Started on keppra IV -Continue with CIWA protocol/Ativan.  -Consult CCM  Audrea Muscat, NP [facility] [phone]

## 2018-01-17 NOTE — ED Notes (Signed)
Pt found to be less responsive, diaphoretic, w/ snoring respirations and foaming @ the mouth. EDP, Campos, made aware. Admitting paged. Awaiting orders.

## 2018-01-17 NOTE — Progress Notes (Signed)
Discussed with nurse Repeat bladder pressure MAP around 83 APP = 63 mmHg Lactate better CVP lower  Bladder pressure still <25; abd perfusion pressure>60, lactate better   Therefore cont observation Maintain supine position Increased sedation - Rass -2 or greater  Mary Sella. Andrey Campanile, MD, FACS General, Bariatric, & Minimally Invasive Surgery [facility], [place]

## 2018-01-17 NOTE — Consult Note (Signed)
Reason for Consult:r/o abdominal compartment syndrome Referring Physician: Dr Charolett Bumpers STARSHA MORNING is an 27 y.o. female.  HPI:  27 year old African-American female admitted yesterday with severe alcoholic pancreatitis who developed respiratory failure requiring intubation as well as oliguric acute kidney injury and we were called to evaluate patient for possible abdominal compartment syndrome.  Reportedly the patient's bladder pressure was 38 mmHg when measured a short time ago.  Patient is intubated and mildly sedated.  Bladder pressure was measured with with head of bed greater than 20 degrees.  History of was obtained from the medical record  She was found to be tachycardic and hypotensive in the emergency department and aggressive fluid resuscitation was initiated.  She was also found to have an elevated lactate as well as physical exam findings concerning for dehydration.  Also complained of abdominal pain, nausea and vomiting.  She also diaphoresis.  She had reportedly been binge drinking since last Sunday.  Reported no history of prior pancreatitis.  Significantly elevated lipase as well as an elevated Ranson's criteria score.  She was also found to have thrombocytopenia.  Because of worsening pulmonary status she was intubated and started on fentanyl and Precedex.  He was also started on a bicarb drip.  Her urine output has been decreasing.  They also report an elevated CVP of 18 however the patient has had more than 6 L of IV fluids.  They also report increased bladder pressure however her bladder pressure was measured with the patient in a head up position  Past Medical History:  Diagnosis Date  . Asthma   . Hypertension   . Seizures ([place])     Past Surgical History:  Procedure Laterality Date  . NO PAST SURGERIES      Family History  Problem Relation Age of Onset  . Diabetes Father   . Breast cancer Mother     Social History:  reports that she has quit smoking. Her  smoking use included cigarettes. She uses smokeless tobacco. She reports that she does not drink alcohol or use drugs.  Allergies:  Allergies  Allergen Reactions  . Benadryl [Diphenhydramine Hcl (Sleep)] Rash  . Latex Rash  . Penicillins Itching and Rash    Has patient had a PCN reaction causing immediate rash, facial/tongue/throat swelling, SOB or lightheadedness with hypotension: Yes Has patient had a PCN reaction causing severe rash involving mucus membranes or skin necrosis: No Has patient had a PCN reaction that required hospitalization: Yes Has patient had a PCN reaction occurring within the last 10 years: No If all of the above answers are "NO", then may proceed with Cephalosporin use.;  . Shrimp [Shellfish Allergy] Hives and Rash    Medications: I have reviewed the patient's current medications.  Results for orders placed or performed during the hospital encounter of 01/16/18 (from the past 48 hour(s))  Urinalysis, Routine w reflex microscopic     Status: Abnormal   Collection Time: 01/16/18  2:37 AM  Result Value Ref Range   Color, Urine AMBER (A) YELLOW    Comment: BIOCHEMICALS MAY BE AFFECTED BY COLOR   APPearance HAZY (A) CLEAR   Specific Gravity, Urine 1.021 1.005 - 1.030   pH 5.0 5.0 - 8.0   Glucose, UA NEGATIVE NEGATIVE mg/dL   Hgb urine dipstick MODERATE (A) NEGATIVE   Bilirubin Urine SMALL (A) NEGATIVE   Ketones, ur 5 (A) NEGATIVE mg/dL   Protein, ur 100 (A) NEGATIVE mg/dL   Nitrite NEGATIVE NEGATIVE   Leukocytes, UA NEGATIVE  NEGATIVE   RBC / HPF 0-5 0 - 5 RBC/hpf   WBC, UA 0-5 0 - 5 WBC/hpf   Bacteria, UA RARE (A) NONE SEEN   Squamous Epithelial / LPF 0-5 0 - 5   Mucus PRESENT    Hyaline Casts, UA PRESENT     Comment: Performed at [facility], [place] [address] [place], [place] [address]  Type and screen Ordered by PROVIDER DEFAULT     Status: None   Collection Time: 01/16/18  4:45 PM  Result Value Ref Range   ABO/RH(D) NN^NOT NEEDED    Antibody  Screen NOT NEEDED    Sample Expiration 01/19/2018    Unit Number [id number]    Blood Component Type RED CELLS,LR    Unit division 00    Status of Unit REL FROM [facility]    Unit tag comment VERBAL ORDERS PER DR PHEIFFER    Transfusion Status OK TO TRANSFUSE    Crossmatch Result NOT NEEDED    Unit Number [id number]    Blood Component Type RBC LR PHER1    Unit division 00    Status of Unit REL FROM [facility]    Unit tag comment VERBAL ORDERS PER DR PHEIFFER    Transfusion Status OK TO TRANSFUSE    Crossmatch Result NOT NEEDED   Prepare fresh frozen plasma     Status: None   Collection Time: 01/16/18  4:45 PM  Result Value Ref Range   Unit Number [id number]    Blood Component Type LIQ PLASMA    Unit division 00    Status of Unit REL FROM [facility]    Unit tag comment VERBAL ORDERS PER DR PHEIFFER    Transfusion Status OK TO TRANSFUSE    Unit Number [id number]    Blood Component Type LIQ PLASMA    Unit division 00    Status of Unit REL FROM [facility]    Unit tag comment VERBAL ORDERS PER DR PHEIFFER    Transfusion Status      OK TO TRANSFUSE Performed at [facility], [place] [address], [place], [place] [address]   POC CBG, ED     Status: None   Collection Time: 01/16/18  4:55 PM  Result Value Ref Range   Glucose-Capillary 75 65 - 99 mg/dL  I-Stat CG4 Lactic Acid, ED  (not at  [facility])     Status: Abnormal   Collection Time: 01/16/18  4:56 PM  Result Value Ref Range   Lactic Acid, Venous 3.71 (HH) 0.5 - 1.9 mmol/L   Comment NOTIFIED PHYSICIAN   I-Stat Beta hCG blood, ED ([facility], [facility], [facility] only)     Status: None   Collection Time: 01/16/18  4:59 PM  Result Value Ref Range   I-stat hCG, quantitative <5.0 <5 mIU/mL   Comment 3            Comment:   GEST. AGE      CONC.  (mIU/mL)   <=1 WEEK        5 - 50     2 WEEKS       50 - 500     3 WEEKS       100 - 10,000     4 WEEKS     1,000 - 30,000        FEMALE AND NON-PREGNANT FEMALE:     LESS THAN 5 mIU/mL   I-Stat Chem 8, ED      Status: Abnormal   Collection Time: 01/16/18  5:00 PM  Result Value Ref Range   Sodium 128 (L) 135 - 145 mmol/L   Potassium 4.7 3.5 - 5.1 mmol/L   Chloride 102 101 - 111 mmol/L   BUN 32 (H) 6 - 20 mg/dL   Creatinine, Ser 1.90 (H) 0.44 - 1.00 mg/dL   Glucose, Bld 87 65 - 99 mg/dL   Calcium, Ion 0.91 (L) 1.15 - 1.40 mmol/L   TCO2 11 (L) 22 - 32 mmol/L   Hemoglobin 16.3 (H) 12.0 - 15.0 g/dL   HCT 48.0 (H) 36.0 - 46.0 %  Comprehensive metabolic panel     Status: Abnormal   Collection Time: 01/16/18  5:28 PM  Result Value Ref Range   Sodium 130 (L) 135 - 145 mmol/L   Potassium 4.4 3.5 - 5.1 mmol/L   Chloride 100 (L) 101 - 111 mmol/L   CO2 11 (L) 22 - 32 mmol/L   Glucose, Bld 82 65 - 99 mg/dL   BUN 26 (H) 6 - 20 mg/dL   Creatinine, Ser 2.01 (H) 0.44 - 1.00 mg/dL   Calcium 7.6 (L) 8.9 - 10.3 mg/dL   Total Protein 5.6 (L) 6.5 - 8.1 g/dL   Albumin 2.7 (L) 3.5 - 5.0 g/dL   AST 54 (H) 15 - 41 U/L   ALT 25 14 - 54 U/L   Alkaline Phosphatase 37 (L) 38 - 126 U/L   Total Bilirubin 1.2 0.3 - 1.2 mg/dL   GFR calc non Af Amer 32 (L) >60 mL/min   GFR calc Af Amer 38 (L) >60 mL/min    Comment: (NOTE) The eGFR has been calculated using the CKD EPI equation. This calculation has not been validated in all clinical situations. eGFR's persistently <60 mL/min signify possible Chronic Kidney Disease.    Anion gap 19 (H) 5 - 15    Comment: Performed at [facility], [place] [address] [place], [place] [address]  CBC WITH DIFFERENTIAL     Status: Abnormal   Collection Time: 01/16/18  5:28 PM  Result Value Ref Range   WBC 15.8 (H) 4.0 - 10.5 K/uL   RBC 3.25 (L) 3.87 - 5.11 MIL/uL   Hemoglobin 12.2 12.0 - 15.0 g/dL    Comment: REPEATED TO VERIFY DELTA CHECK NOTED    HCT 34.9 (L) 36.0 - 46.0 %   MCV 107.4 (H) 78.0 - 100.0 fL   MCH 37.5 (H) 26.0 - 34.0 pg   MCHC 35.0 30.0 - 36.0 g/dL   RDW 11.5 11.5 - 15.5 %   Platelets 79 (L) 150 - 400 K/uL    Comment: REPEATED TO VERIFY SPECIMEN CHECKED  FOR CLOTS PLATELET COUNT CONFIRMED BY SMEAR    Neutrophils Relative % 85 %   Neutro Abs 13.5 (H) 1.7 - 7.7 K/uL   Lymphocytes Relative 4 %   Lymphs Abs 0.6 (L) 0.7 - 4.0 K/uL   Monocytes Relative 9 %   Monocytes Absolute 1.4 (H) 0.1 - 1.0 K/uL   Eosinophils Relative 1 %   Eosinophils Absolute 0.1 0.0 - 0.7 K/uL   Basophils Relative 0 %   Basophils Absolute 0.0 0.0 - 0.1 K/uL   Immature Granulocytes 1 %   Abs Immature Granulocytes 0.1 0.0 - 0.1 K/uL    Comment: Performed at [facility], [address] [place], [place] [address]  Blood Culture (routine x 2)     Status: None (Preliminary result)   Collection Time: 01/16/18  5:28 PM  Result Value Ref Range  Specimen Description BLOOD FEMORAL ARTERY RIGHT    Special Requests      BOTTLES DRAWN AEROBIC AND ANAEROBIC Blood Culture adequate volume   Culture      NO GROWTH < 24 HOURS Performed at [facility], [place] [address] [place], [place] [address]    Report Status PENDING   Lipase, blood     Status: Abnormal   Collection Time: 01/16/18  5:28 PM  Result Value Ref Range   Lipase 2,211 (H) 11 - 51 U/L    Comment: RESULTS CONFIRMED BY MANUAL DILUTION Performed at [facility], [place] [address], [place], [place] [address]   HIV antibody (Routine Testing)     Status: None   Collection Time: 01/16/18  8:45 PM  Result Value Ref Range   HIV Screen 4th Generation wRfx Non Reactive Non Reactive    Comment: (NOTE) Performed At: [facility] [place], [place] [id number] Rush Farmer MD [id number] Performed at [facility], [place] [address] [place], [place] [address]   Magnesium     Status: Abnormal   Collection Time: 01/16/18  8:45 PM  Result Value Ref Range   Magnesium 0.9 (LL) 1.7 - 2.4 mg/dL    Comment: CRITICAL RESULT CALLED TO, READ BACK BY AND VERIFIED WITH: P.OBAMA,RN 2128 01/16/18 M.CAMPBELL Performed at [facility], [place] [address], [place], [place] [address]    I-Stat CG4 Lactic Acid, ED  (not at  [facility])     Status: Abnormal   Collection Time: 01/16/18  8:56 PM  Result Value Ref Range   Lactic Acid, Venous 4.41 (HH) 0.5 - 1.9 mmol/L  Comprehensive metabolic panel     Status: Abnormal   Collection Time: 01/17/18 12:41 AM  Result Value Ref Range   Sodium 131 (L) 135 - 145 mmol/L   Potassium 4.9 3.5 - 5.1 mmol/L   Chloride 100 (L) 101 - 111 mmol/L   CO2 9 (L) 22 - 32 mmol/L   Glucose, Bld 158 (H) 65 - 99 mg/dL   BUN 26 (H) 6 - 20 mg/dL   Creatinine, Ser 1.75 (H) 0.44 - 1.00 mg/dL   Calcium 8.3 (L) 8.9 - 10.3 mg/dL   Total Protein 7.3 6.5 - 8.1 g/dL   Albumin 3.7 3.5 - 5.0 g/dL   AST 112 (H) 15 - 41 U/L   ALT QUANTITY NOT SUFFICIENT, UNABLE TO PERFORM TEST 14 - 54 U/L   Alkaline Phosphatase 48 38 - 126 U/L   Total Bilirubin QUANTITY NOT SUFFICIENT, UNABLE TO PERFORM TEST 0.3 - 1.2 mg/dL   GFR calc non Af Amer 38 (L) >60 mL/min   GFR calc Af Amer 44 (L) >60 mL/min    Comment: (NOTE) The eGFR has been calculated using the CKD EPI equation. This calculation has not been validated in all clinical situations. eGFR's persistently <60 mL/min signify possible Chronic Kidney Disease.    Anion gap 22 (H) 5 - 15    Comment: Performed at [facility], [place] [address] [place],  [address]  I-Stat arterial blood gas, ED     Status: Abnormal   Collection Time: 01/17/18  2:09 AM  Result Value Ref Range   pH, Arterial 7.278 (L) 7.350 - 7.450   pCO2 arterial 23.7 (L) 32.0 - 48.0 mmHg   pO2, Arterial 91.0 83.0 - 108.0 mmHg   Bicarbonate 11.1 (L) 20.0 - 28.0 mmol/L   TCO2 12 (L) 22 - 32 mmol/L   O2  Saturation 96.0 %   Acid-base deficit 14.0 (H) 0.0 - 2.0 mmol/L   Patient temperature 98.4 F    Collection site RADIAL, ALLEN'S TEST ACCEPTABLE    Drawn by Operator    Sample type ARTERIAL   Urine rapid drug screen (hosp performed)     Status: Abnormal   Collection Time: 01/17/18  2:37 AM  Result Value Ref Range   Opiates POSITIVE (A) NONE  DETECTED   Cocaine NONE DETECTED NONE DETECTED   Benzodiazepines NONE DETECTED NONE DETECTED   Amphetamines NONE DETECTED NONE DETECTED   Tetrahydrocannabinol NONE DETECTED NONE DETECTED   Barbiturates NONE DETECTED NONE DETECTED    Comment: (NOTE) DRUG SCREEN FOR MEDICAL PURPOSES ONLY.  IF CONFIRMATION IS NEEDED FOR ANY PURPOSE, NOTIFY LAB WITHIN 5 DAYS. LOWEST DETECTABLE LIMITS FOR URINE DRUG SCREEN Drug Class                     Cutoff (ng/mL) Amphetamine and metabolites    1000 Barbiturate and metabolites    200 Benzodiazepine                 709 Tricyclics and metabolites     300 Opiates and metabolites        300 Cocaine and metabolites        300 THC                            50 Performed at [facility], [place] [address], [place], [place] [address]   CBC     Status: Abnormal   Collection Time: 01/17/18  3:14 AM  Result Value Ref Range   WBC 11.6 (H) 4.0 - 10.5 K/uL   RBC 3.81 (L) 3.87 - 5.11 MIL/uL   Hemoglobin 14.3 12.0 - 15.0 g/dL   HCT 40.6 36.0 - 46.0 %   MCV 106.6 (H) 78.0 - 100.0 fL   MCH 37.5 (H) 26.0 - 34.0 pg   MCHC 35.2 30.0 - 36.0 g/dL   RDW 11.7 11.5 - 15.5 %   Platelets 72 (L) 150 - 400 K/uL    Comment: REPEATED TO VERIFY SPECIMEN CHECKED FOR CLOTS PLATELET COUNT CONFIRMED BY SMEAR Performed at [facility], [place] [address], [place], [place] [address]   Lipase, blood     Status: Abnormal   Collection Time: 01/17/18  3:14 AM  Result Value Ref Range   Lipase 1,153 (H) 11 - 51 U/L    Comment: RESULTS CONFIRMED BY MANUAL DILUTION Performed at [facility], [place] [address], [place], [place] [address]   Hepatic function panel     Status: Abnormal   Collection Time: 01/17/18  3:14 AM  Result Value Ref Range   Total Protein 5.9 (L) 6.5 - 8.1 g/dL   Albumin 2.7 (L) 3.5 - 5.0 g/dL   AST 105 (H) 15 - 41 U/L   ALT 27 14 - 54 U/L   Alkaline Phosphatase 34 (L) 38 - 126 U/L   Total Bilirubin 1.1 0.3 - 1.2 mg/dL   Bilirubin, Direct 0.4 0.1  - 0.5 mg/dL   Indirect Bilirubin 0.7 0.3 - 0.9 mg/dL    Comment: Performed at [facility] [address], [place], [place] [address]  CBG monitoring, ED     Status: Abnormal   Collection Time: 01/17/18  5:23 AM  Result Value Ref Range   Glucose-Capillary 217 (H) 65 - 99 mg/dL  Comprehensive  metabolic panel     Status: Abnormal   Collection Time: 01/17/18  6:37 AM  Result Value Ref Range   Sodium 131 (L) 135 - 145 mmol/L   Potassium 5.2 (H) 3.5 - 5.1 mmol/L   Chloride 101 101 - 111 mmol/L   CO2 14 (L) 22 - 32 mmol/L   Glucose, Bld 206 (H) 65 - 99 mg/dL   BUN 27 (H) 6 - 20 mg/dL   Creatinine, Ser 1.60 (H) 0.44 - 1.00 mg/dL   Calcium 7.4 (L) 8.9 - 10.3 mg/dL   Total Protein 5.8 (L) 6.5 - 8.1 g/dL   Albumin 2.8 (L) 3.5 - 5.0 g/dL   AST 111 (H) 15 - 41 U/L   ALT 28 14 - 54 U/L   Alkaline Phosphatase 33 (L) 38 - 126 U/L   Total Bilirubin 1.0 0.3 - 1.2 mg/dL   GFR calc non Af Amer 43 (L) >60 mL/min   GFR calc Af Amer 49 (L) >60 mL/min    Comment: (NOTE) The eGFR has been calculated using the CKD EPI equation. This calculation has not been validated in all clinical situations. eGFR's persistently <60 mL/min signify possible Chronic Kidney Disease.    Anion gap 16 (H) 5 - 15    Comment: Performed at [facility], [place] [address], [place], [place] [address]  Magnesium     Status: Abnormal   Collection Time: 01/17/18  6:37 AM  Result Value Ref Range   Magnesium 1.0 (L) 1.7 - 2.4 mg/dL    Comment: Performed at [place] [address] [place], [place] [address]  Lactic acid, plasma     Status: Abnormal   Collection Time: 01/17/18  6:37 AM  Result Value Ref Range   Lactic Acid, Venous 6.1 (HH) 0.5 - 1.9 mmol/L    Comment: CRITICAL RESULT CALLED TO, READ BACK BY AND VERIFIED WITH: L.VERNON RN @ [phone] 01/17/18 BY C.EDENS Performed at [facility], [place] [address], [place], [place] [address]   MRSA PCR Screening     Status: None   Collection Time: 01/17/18   7:12 AM  Result Value Ref Range   MRSA by PCR NEGATIVE NEGATIVE    Comment:        The GeneXpert MRSA Assay (FDA approved for NASAL specimens only), is one component of a comprehensive MRSA colonization surveillance program. It is not intended to diagnose MRSA infection nor to guide or monitor treatment for MRSA infections. Performed at [facility], [place] [address] [place], [place] [address]   Glucose, capillary     Status: Abnormal   Collection Time: 01/17/18  7:48 AM  Result Value Ref Range   Glucose-Capillary 156 (H) 65 - 99 mg/dL   Comment 1 Notify RN   Lactic acid, plasma     Status: Abnormal   Collection Time: 01/17/18  8:19 AM  Result Value Ref Range   Lactic Acid, Venous 4.3 (HH) 0.5 - 1.9 mmol/L    Comment: CRITICAL RESULT CALLED TO, READ BACK BY AND VERIFIED WITH: Achille Rich RN AT 1003 01/17/18 BY [facility] Performed at [facility], [place] [address], [place], [place] [address]   CBC with Differential/Platelet     Status: Abnormal   Collection Time: 01/17/18  8:19 AM  Result Value Ref Range   WBC 8.7 4.0 - 10.5 K/uL   RBC 2.96 (L) 3.87 - 5.11 MIL/uL   Hemoglobin 11.2 (L) 12.0 - 15.0 g/dL  Comment: REPEATED TO VERIFY   HCT 31.9 (L) 36.0 - 46.0 %   MCV 107.8 (H) 78.0 - 100.0 fL   MCH 37.8 (H) 26.0 - 34.0 pg   MCHC 35.1 30.0 - 36.0 g/dL   RDW 11.9 11.5 - 15.5 %   Platelets 57 (L) 150 - 400 K/uL    Comment: REPEATED TO VERIFY PLATELET COUNT CONFIRMED BY SMEAR    Neutrophils Relative % 91 %   Lymphocytes Relative 4 %   Monocytes Relative 5 %   Eosinophils Relative 0 %   Basophils Relative 0 %   Neutro Abs 8.0 (H) 1.7 - 7.7 K/uL   Lymphs Abs 0.3 (L) 0.7 - 4.0 K/uL   Monocytes Absolute 0.4 0.1 - 1.0 K/uL   Eosinophils Absolute 0.0 0.0 - 0.7 K/uL   Basophils Absolute 0.0 0.0 - 0.1 K/uL   RBC Morphology TARGET CELLS    WBC Morphology INCREASED BANDS (>20% BANDS)     Comment: MODERATE LEFT SHIFT (>5% METAS AND MYELOS,OCC PRO NOTED) Performed at  [facility], [address], [place], [place] [address]   Procalcitonin     Status: None   Collection Time: 01/17/18  8:19 AM  Result Value Ref Range   Procalcitonin 1.16 ng/mL    Comment:        Interpretation: PCT > 0.5 ng/mL and <= 2 ng/mL: Systemic infection (sepsis) is possible, but other conditions are known to elevate PCT as well. (NOTE)       Sepsis PCT Algorithm           Lower Respiratory Tract                                      Infection PCT Algorithm    ----------------------------     ----------------------------         PCT < 0.25 ng/mL                PCT < 0.10 ng/mL         Strongly encourage             Strongly discourage   discontinuation of antibiotics    initiation of antibiotics    ----------------------------     -----------------------------       PCT 0.25 - 0.50 ng/mL            PCT 0.10 - 0.25 ng/mL               OR       >80% decrease in PCT            Discourage initiation of                                            antibiotics      Encourage discontinuation           of antibiotics    ----------------------------     -----------------------------         PCT >= 0.50 ng/mL              PCT 0.26 - 0.50 ng/mL                AND       <80% decrease in PCT  Encourage initiation of                                             antibiotics       Encourage continuation           of antibiotics    ----------------------------     -----------------------------        PCT >= 0.50 ng/mL                  PCT > 0.50 ng/mL               AND         increase in PCT                  Strongly encourage                                      initiation of antibiotics    Strongly encourage escalation           of antibiotics                                     -----------------------------                                           PCT <= 0.25 ng/mL                                                 OR                                        > 80%  decrease in PCT                                     Discontinue / Do not initiate                                             antibiotics Performed at [facility], [address], [place], [place] [address]   Comprehensive metabolic panel     Status: Abnormal   Collection Time: 01/17/18  8:33 AM  Result Value Ref Range   Sodium 135 135 - 145 mmol/L   Potassium 3.8 3.5 - 5.1 mmol/L    Comment: DELTA CHECK NOTED   Chloride 112 (H) 101 - 111 mmol/L   CO2 10 (L) 22 - 32 mmol/L   Glucose, Bld 162 (H) 65 - 99 mg/dL   BUN 23 (H) 6 - 20 mg/dL   Creatinine, Ser 1.15 (H) 0.44 - 1.00 mg/dL   Calcium 5.9 (LL) 8.9 - 10.3 mg/dL    Comment: CRITICAL RESULT CALLED TO, READ  BACK BY AND VERIFIED WITH: Achille Rich RN AT 1003 01/17/18 BY WOOLLENK    Total Protein 4.5 (L) 6.5 - 8.1 g/dL   Albumin 2.2 (L) 3.5 - 5.0 g/dL   AST 82 (H) 15 - 41 U/L   ALT 22 14 - 54 U/L   Alkaline Phosphatase 22 (L) 38 - 126 U/L   Total Bilirubin 0.8 0.3 - 1.2 mg/dL   GFR calc non Af Amer >60 >60 mL/min   GFR calc Af Amer >60 >60 mL/min    Comment: (NOTE) The eGFR has been calculated using the CKD EPI equation. This calculation has not been validated in all clinical situations. eGFR's persistently <60 mL/min signify possible Chronic Kidney Disease.    Anion gap 13 5 - 15    Comment: Performed at [place] [address], [place], [place] [address]  I-STAT 3, arterial blood gas (G3+)     Status: Abnormal   Collection Time: 01/17/18  8:55 AM  Result Value Ref Range   pH, Arterial 7.255 (L) 7.350 - 7.450   pCO2 arterial 26.5 (L) 32.0 - 48.0 mmHg   pO2, Arterial 169.0 (H) 83.0 - 108.0 mmHg   Bicarbonate 11.7 (L) 20.0 - 28.0 mmol/L   TCO2 12 (L) 22 - 32 mmol/L   O2 Saturation 99.0 %   Acid-base deficit 14.0 (H) 0.0 - 2.0 mmol/L   Patient temperature 99.2 F    Collection site RADIAL, ALLEN'S TEST ACCEPTABLE    Drawn by RT    Sample type ARTERIAL   I-STAT 3, arterial blood gas (G3+)     Status:  Abnormal   Collection Time: 01/17/18 11:37 AM  Result Value Ref Range   pH, Arterial 7.298 (L) 7.350 - 7.450   pCO2 arterial 26.9 (L) 32.0 - 48.0 mmHg   pO2, Arterial 139.0 (H) 83.0 - 108.0 mmHg   Bicarbonate 13.1 (L) 20.0 - 28.0 mmol/L   TCO2 14 (L) 22 - 32 mmol/L   O2 Saturation 99.0 %   Acid-base deficit 12.0 (H) 0.0 - 2.0 mmol/L   Patient temperature 99.2 F    Collection site ARTERIAL LINE    Drawn by RT    Sample type ARTERIAL   Protime-INR     Status: Abnormal   Collection Time: 01/17/18 12:26 PM  Result Value Ref Range   Prothrombin Time 16.7 (H) 11.4 - 15.2 seconds   INR 1.36     Comment: Performed at [facility], [place] [address] [place], [place] [address]  Lactic acid, plasma     Status: Abnormal   Collection Time: 01/17/18 12:39 PM  Result Value Ref Range   Lactic Acid, Venous 4.3 (HH) 0.5 - 1.9 mmol/L    Comment: CRITICAL RESULT CALLED TO, READ BACK BY AND VERIFIED WITH: Achille Rich RN AT 6440 01/17/18 BY [facility] Performed at [facility], [place] [address], [place], [place] [address]   Glucose, capillary     Status: Abnormal   Collection Time: 01/17/18 12:42 PM  Result Value Ref Range   Glucose-Capillary 208 (H) 65 - 99 mg/dL   Comment 1 Arterial Specimen   Cortisol     Status: None   Collection Time: 01/17/18  3:19 PM  Result Value Ref Range   Cortisol, Plasma 10.7 ug/dL    Comment: (NOTE) AM    6.7 - 22.6 ug/dL PM   <10.0       ug/dL Performed at [facility] [place],  [place] [address]   Troponin I (q 6hr x 3)     Status: None   Collection Time: 01/17/18  3:19 PM  Result Value Ref Range   Troponin I <0.03 <0.03 ng/mL    Comment: Performed at [facility], [place] [address], [place], [place] [address]  Glucose, capillary     Status: Abnormal   Collection Time: 01/17/18  4:57 PM  Result Value Ref Range   Glucose-Capillary 205 (H) 65 - 99 mg/dL   Comment 1 Notify RN     Ct Abdomen Pelvis Wo Contrast  Result  Date: 01/16/2018 CLINICAL DATA:  Abdominal pain radiating to back EXAM: CT ABDOMEN AND PELVIS WITHOUT CONTRAST TECHNIQUE: Multidetector CT imaging of the abdomen and pelvis was performed following the standard protocol without oral or IV contrast. COMPARISON:  None. FINDINGS: Lower chest: Lung bases are clear. Hepatobiliary: There is a patent steatosis. No focal liver lesions are appreciable. Gallbladder wall is not appreciably thickened. There is suspected sludge in gallbladder. There is no biliary duct dilatation. Pancreas: There is mild generalized prominence of the pancreas with what is felt to be a degree edema throughout the pancreas. There is surrounding peripancreatic fluid. Fluid tracks on the right laterally into the right lateral conal fascia region. There is loculated ascites throughout the anterior abdominal region with mesenteric thickening throughout the anterolateral abdominal wall regions extending inferior to the level of the pelvis. There is moderate ascites in the dependent portion of the pelvis as well which may partially loculated. Spleen: No splenic lesions are evident. Adrenals/Urinary Tract: Adrenals bilaterally appear unremarkable. Kidneys bilaterally show no evident mass or hydronephrosis on either side. There is no renal or ureteral calculus on either side. Urinary bladder is midline with wall thickness within normal limits. Stomach/Bowel: There is mild wall thickening in multiple loops of small bowel, likely due to enteritis secondary to the pancreatitis. Similar changes are noted in the ascending and descending colon. There is no evident bowel obstruction. No free air or portal venous air. Vascular/Lymphatic: There is no abdominal aortic aneurysm. No vascular lesions are evident. There is no appreciable adenopathy in the abdomen or pelvis. Reproductive: Uterus is anteverted.  No evident pelvic mass. Other: Appendix appears normal. There is no evident abscess in the abdomen or pelvis.  Musculoskeletal: No blastic or lytic bone lesions. No intramuscular or abdominal wall lesions are evident. IMPRESSION: 1. Findings indicative of acute pancreatitis. Pancreas appears edematous with peripancreatic fluid. There is fluid tracking throughout the anterolateral abdominal regions with mesenteric thickening in these areas felt to be due to pancreatitis. There is localized partially loculated ascites in the dependent portion of the pelvis. 2. Wall thickening involving multiple loops of small and large bowel, likely due to enterocolitis secondary to inflammation from the peripancreatic fluid/pancreatitis. No bowel obstruction. 3. No frank abscess in the abdomen or pelvis. Appendix appears normal. 4.  No renal or ureteral calculus.  No hydronephrosis. 5. Question sludge in gallbladder. Gallbladder wall does not appear appreciably thickened. 6.  Hepatic steatosis. Electronically Signed   By: Lowella Grip III M.D.   On: 01/16/2018 18:08   Dg Chest Portable 1 View  Result Date: 01/17/2018 CLINICAL DATA:  Post intubation EXAM: PORTABLE CHEST 1 VIEW COMPARISON:  05/02/2015 FINDINGS: An endotracheal tube has been placed with tip measuring 1.8 cm above the carina. Enteric tube tip is off the field of view but below the left hemidiaphragm. Shallow inspiration. Heart size and pulmonary vascularity are normal. Lungs are clear and expanded.  Mediastinal contours appear intact. Thoracic scoliosis convex towards the right. IMPRESSION: Appliances appear in satisfactory position. No active pulmonary disease. Electronically Signed   By: Lucienne Capers M.D.   On: 01/17/2018 06:25   Dg Chest Port 1v Same Day  Result Date: 01/17/2018 CLINICAL DATA:  Displacement of central venous catheter. EXAM: PORTABLE CHEST 1 VIEW COMPARISON:  01/17/2018 at 6:08 a.m. FINDINGS: Since prior exam, a left subclavian central venous line has been placed. The tip projects at the caval atrial junction. No pneumothorax. The endotracheal  tube and the nasal/orogastric tube are stable and well positioned. There is new left lung base opacity mostly silhouetting the left hemidiaphragm. This may reflect atelectasis or a rapidly evolving infection. Atelectasis is favored. IMPRESSION: 1. New left subclavian central venous line is well positioned, tip at the caval atrial junction. No pneumothorax. 2. New left lung base opacity, most likely atelectasis. Consider pneumonia if there are consistent clinical findings. Electronically Signed   By: Lajean Manes M.D.   On: 01/17/2018 12:27    Review of Systems  Unable to perform ROS: Intubated   Blood pressure (!) 80/53, pulse (!) 112, temperature 97.6 F (36.4 C), temperature source Oral, resp. rate 18, height _0  (1.575 m), weight 56.5 kg (124 lb 9 oz), SpO2 98 %. Physical Exam  Vitals reviewed. Constitutional: She appears well-developed and well-nourished. No distress. She is intubated.  HENT:  Head: Normocephalic and atraumatic.  Right Ear: External ear normal.  Left Ear: External ear normal.  Eyes: Conjunctivae are normal.  Neck: Normal range of motion. Neck supple. No JVD present. No tracheal deviation present.  Cardiovascular: Normal heart sounds and intact distal pulses. Tachycardia present.  Mild tachycardia  Respiratory: Effort normal and breath sounds normal. No stridor. She is intubated. No respiratory distress. She has no wheezes.  GI: Normal appearance. There is no rigidity, no rebound and no guarding.  Some distension, not firm, some softness to palpation; not typmpanic  Musculoskeletal: She exhibits no edema or tenderness.  Lymphadenopathy:    She has no cervical adenopathy.  Neurological: She exhibits normal muscle tone.  Intubated, mild sedation; OE to stimulation  Skin: Skin is warm and dry. No rash noted. She is not diaphoretic. No erythema. No pallor.  No edema; +SCDs  Psychiatric:  Unable to assess    Assessment/Plan: Severe alcoholic  pancreatitis Shock Acute kidney injury Respiratory failure Moderate protein calorie malnutrition Thrombocytopenia Increased abdominal pressure  I believe the bladder pressure was artificially elevated due to her having her bladder pressure measured with her with a head up position  We remeasured her bladder pressure with the patient supine and it was 18.  Mean arterial pressure was around 83.  Her abdominal perfusion pressure (APP) is then 65 mmHg which is greater than target APP of 60 mmHg  Her abdomen has some distention and is not rigid  Repeat lactate  While the patient has increased intra-abdominal pressure I do not believe she has an abdominal compartment syndrome presently.  Her intra-abdominal pressure is less than 25 mmHg.  Her APP is 65 mmHg.  She does not have high vent pressures. I would work on improving supportive measures such as increasing her sedation and keeping her supine to see if this will help improve her intra-abdominal pressure and abdominal compliance  Repeat bladder pressure at 2030  Discussed with CCM Updated family at [facility]  Total consult time 1 hour Greer Pickerel 01/17/2018, 6:04 PM

## 2018-01-17 NOTE — Procedures (Signed)
Central Venous Catheter Insertion Procedure Note Donna Short [id number] 04/07/1991  Procedure: Insertion of Central Venous Catheter Indications: Drug and/or fluid administration  Procedure Details Consent: Risks of procedure as well as the alternatives and risks of each were explained to the (patient/caregiver).  Consent for procedure obtained. Time Out: Verified patient identification, verified procedure, site/side was marked, verified correct patient position, special equipment/implants available, medications/allergies/relevent history reviewed, required imaging and test results available.  Performed  Maximum sterile technique was used including antiseptics, cap, gloves, gown, hand hygiene, mask and sheet. Skin prep: Chlorhexidine; local anesthetic administered A antimicrobial bonded/coated triple lumen catheter was placed in the left subclavian vein using the Seldinger technique.  Evaluation Blood flow good Complications: No apparent complications Patient did tolerate procedure well. Chest X-ray ordered to verify placement.  CXR: pending.  Wael Z Aljishi 01/17/2018, 12:06 PM

## 2018-01-17 NOTE — Consult Note (Deleted)
PULMONARY / CRITICAL CARE MEDICINE   Name: PARMINDER TRAPANI MRN: [id number] DOB: 1990/12/02    ADMISSION DATE:  01/16/2018 CONSULTATION DATE:  01/16/2018  REFERRING MD:  Dr. Mikeal Hawthorne   CHIEF COMPLAINT:  Seizures   HISTORY OF PRESENT ILLNESS:   27 year old female with PMH of ETOH, HTN, Seizures   Presents to ED on 5/25 with abdominal pain, nausea, vomiting, Found to have acute pancreatitis with acute kidney injury. Reportedly binge drinks. Last drink Tuesday. LA 4.41. BP on arrival 68/45.  After fluids BP improved. Admitted with [facility].   Over night noted to became Hypertensive, Tachycardic, and diaphoretic. Started on CIWA protocol. At 0500 patient became less responsive with snorous respirations with noted seizure requiring intubation and transfer to ICU.   PAST MEDICAL HISTORY :  She  has a past medical history of Asthma, Hypertension, and Seizures (HCC).  PAST SURGICAL HISTORY: She  has a past surgical history that includes No past surgeries.  Allergies  Allergen Reactions  . Benadryl [Diphenhydramine Hcl (Sleep)] Rash  . Latex Rash  . Penicillins Itching and Rash    Has patient had a PCN reaction causing immediate rash, facial/tongue/throat swelling, SOB or lightheadedness with hypotension: Yes Has patient had a PCN reaction causing severe rash involving mucus membranes or skin necrosis: No Has patient had a PCN reaction that required hospitalization: Yes Has patient had a PCN reaction occurring within the last 10 years: No If all of the above answers are "NO", then may proceed with Cephalosporin use.;  . Shrimp [Shellfish Allergy] Hives and Rash    No current facility-administered medications on file prior to encounter.    Current Outpatient Medications on File Prior to Encounter  Medication Sig  . aspirin-acetaminophen-caffeine (EXCEDRIN MIGRAINE) 250-250-65 MG tablet Take 2 tablets by mouth every 6 (six) hours as needed for headache or migraine.  . ondansetron  (ZOFRAN-ODT) 4 MG disintegrating tablet Take 1 tablet (4 mg total) by mouth every 8 (eight) hours as needed for nausea or vomiting.  . lamoTRIgine (LAMICTAL) 100 MG tablet Take 1 tablet (100 mg total) by mouth 2 (two) times daily. (Patient not taking: Reported on 01/16/2018)    FAMILY HISTORY:  Her indicated that her mother is alive. She indicated that her father is alive.   SOCIAL HISTORY: She  reports that she has quit smoking. Her smoking use included cigarettes. She uses smokeless tobacco. She reports that she does not drink alcohol or use drugs.  REVIEW OF SYSTEMS:   Unable to review as patient is encephalopathic   SUBJECTIVE:   VITAL SIGNS: BP (!) 161/131 Comment: RN aware  Pulse (!) 166 Comment: RN aware  Temp 97.9 F (36.6 C) (Oral)   Resp (!) 28   SpO2 96%   HEMODYNAMICS:    VENTILATOR SETTINGS:    INTAKE / OUTPUT: No intake/output data recorded.  PHYSICAL EXAMINATION: General:  Young female, no distress  Neuro: intubated, does not follow commands  HEENT:  Dry MM  Cardiovascular:  Tachy, no MRG Lungs:  Labored, diminished, no wheeze  Abdomen:  Thin, active bowel sounds  Musculoskeletal:  -edema  Skin:  Warm, dry   LABS:  BMET Recent Labs  Lab 01/16/18 1700 01/16/18 1728 01/17/18 0041  NA 128* 130* 131*  K 4.7 4.4 4.9  CL 102 100* 100*  CO2  --  11* 9*  BUN 32* 26* 26*  CREATININE 1.90* 2.01* 1.75*  GLUCOSE 87 82 158*    Electrolytes Recent Labs  Lab 01/16/18 1728  01/16/18 2045 01/17/18 0041  CALCIUM 7.6*  --  8.3*  MG  --  0.9*  --     CBC Recent Labs  Lab 01/16/18 1700 01/16/18 1728 01/17/18 0314  WBC  --  15.8* 11.6*  HGB 16.3* 12.2 14.3  HCT 48.0* 34.9* 40.6  PLT  --  79* 72*    Coag's No results for input(s): APTT, INR in the last 168 hours.  Sepsis Markers Recent Labs  Lab 01/16/18 1656 01/16/18 2056  LATICACIDVEN 3.71* 4.41*    ABG Recent Labs  Lab 01/17/18 0209  PHART 7.278*  PCO2ART 23.7*  PO2ART 91.0     Liver Enzymes Recent Labs  Lab 01/16/18 1728 01/17/18 0041 01/17/18 0314  AST 54* 112* 105*  ALT 25 QUANTITY NOT SUFFICIENT, UNABLE TO PERFORM TEST 27  ALKPHOS 37* 48 34*  BILITOT 1.2 QUANTITY NOT SUFFICIENT, UNABLE TO PERFORM TEST 1.1  ALBUMIN 2.7* 3.7 2.7*    Cardiac Enzymes No results for input(s): TROPONINI, PROBNP in the last 168 hours.  Glucose Recent Labs  Lab 01/16/18 1655 01/17/18 0523  GLUCAP 75 217*    Imaging Ct Abdomen Pelvis Wo Contrast  Result Date: 01/16/2018 CLINICAL DATA:  Abdominal pain radiating to back EXAM: CT ABDOMEN AND PELVIS WITHOUT CONTRAST TECHNIQUE: Multidetector CT imaging of the abdomen and pelvis was performed following the standard protocol without oral or IV contrast. COMPARISON:  None. FINDINGS: Lower chest: Lung bases are clear. Hepatobiliary: There is a patent steatosis. No focal liver lesions are appreciable. Gallbladder wall is not appreciably thickened. There is suspected sludge in gallbladder. There is no biliary duct dilatation. Pancreas: There is mild generalized prominence of the pancreas with what is felt to be a degree edema throughout the pancreas. There is surrounding peripancreatic fluid. Fluid tracks on the right laterally into the right lateral conal fascia region. There is loculated ascites throughout the anterior abdominal region with mesenteric thickening throughout the anterolateral abdominal wall regions extending inferior to the level of the pelvis. There is moderate ascites in the dependent portion of the pelvis as well which may partially loculated. Spleen: No splenic lesions are evident. Adrenals/Urinary Tract: Adrenals bilaterally appear unremarkable. Kidneys bilaterally show no evident mass or hydronephrosis on either side. There is no renal or ureteral calculus on either side. Urinary bladder is midline with wall thickness within normal limits. Stomach/Bowel: There is mild wall thickening in multiple loops of small bowel,  likely due to enteritis secondary to the pancreatitis. Similar changes are noted in the ascending and descending colon. There is no evident bowel obstruction. No free air or portal venous air. Vascular/Lymphatic: There is no abdominal aortic aneurysm. No vascular lesions are evident. There is no appreciable adenopathy in the abdomen or pelvis. Reproductive: Uterus is anteverted.  No evident pelvic mass. Other: Appendix appears normal. There is no evident abscess in the abdomen or pelvis. Musculoskeletal: No blastic or lytic bone lesions. No intramuscular or abdominal wall lesions are evident. IMPRESSION: 1. Findings indicative of acute pancreatitis. Pancreas appears edematous with peripancreatic fluid. There is fluid tracking throughout the anterolateral abdominal regions with mesenteric thickening in these areas felt to be due to pancreatitis. There is localized partially loculated ascites in the dependent portion of the pelvis. 2. Wall thickening involving multiple loops of small and large bowel, likely due to enterocolitis secondary to inflammation from the peripancreatic fluid/pancreatitis. No bowel obstruction. 3. No frank abscess in the abdomen or pelvis. Appendix appears normal. 4.  No renal or ureteral calculus.  No  hydronephrosis. 5. Question sludge in gallbladder. Gallbladder wall does not appear appreciably thickened. 6.  Hepatic steatosis. Electronically Signed   By: Bretta Bang III M.D.   On: 01/16/2018 18:08     STUDIES:  CT A/P 5/25 > 1. Findings indicative of acute pancreatitis. Pancreas appears edematous with peripancreatic fluid. There is fluid tracking throughout the anterolateral abdominal regions with mesenteric thickening in these areas felt to be due to pancreatitis. There is localized partially loculated ascites in the dependent portion of the pelvis. Wall thickening involving multiple loops of small and large bowel, likely due to enterocolitis secondary to inflammation  from the peripancreatic fluid/pancreatitis. No bowel obstruction. No frank abscess in the abdomen or pelvis. Appendix appears Normal. No renal or ureteral calculus.  No hydronephrosis. Question sludge in gallbladder. Gallbladder wall does not appear appreciably thickened. Hepatic steatosis.  CULTURES: Blood 5/25 >>   ANTIBIOTICS: Zosyn 5/25 >>  SIGNIFICANT EVENTS: 5/25 > Presents to ED   LINES/TUBES: ETT 5/26 >>  DISCUSSION: 27 year old female with h/o etoh abuse presents to ED with N/V and Abdominal pain. Found to have acute pancreatitis. With witnessed seizures and progressive AMS, intubated.   ASSESSMENT / PLAN:  PULMONARY A: Respiratory Insufficieny in setting of seizures  P:   Vent Support > 8cc/kg TV Trend ABG/CXR Pulmonary Hygiene  Daily SBT/WUA  CARDIOVASCULAR A:  Tachycardia in setting of Withdrawals  P:  Cardiac Monitoring  Maintain Systolic <180   RENAL A:   Acute Kidney Injury  Anion Gap Metabolic Acidosis with Lactic Acidosis  Hyponatremia  Hypomagnesemia  P:   Trend BMP  Replace electrolytes as indicated  Trend LA  S/P 3 L in ED, LR @ 125 ml/hr   GASTROINTESTINAL A:   SUP Acute Pancreatitis  P:   NPO PPI   HEMATOLOGIC A:   Thrombocytopenia  P:  Trend CBC  SCDs Only   INFECTIOUS A:   Acute Pancreatitis  P:   Trend WBC and Fever Curve Follow Culture Data Continue Zosyn   ENDOCRINE A:   Hyperglycemia    P:   Trend Glucose  SSI   NEUROLOGIC A:   Seizures  H/O ETOH abuse  P:   RASS goal: 0/-1 Wean Precedex to achieve RASS Add Fentanyl gtt Folic Acid/Thimaine  Ativan PRN  Continue Keppra    FAMILY  - Updates: no family at bedside   - Inter-disciplinary family meet or Palliative Care meeting due by: 01/24/2018   Jovita Kussmaul, AGACNP-BC [facility]  Pgr: [phone]  PCCM Pgr: [phone]

## 2018-01-17 NOTE — Progress Notes (Signed)
See attending note 7am 5/26  Called to bedside by RN for Lactate of 6.1 drawn at 0600 5/26 ( Increased from 4.4 collected 2000 5/25).   O: Serum bicarb is 11. >> Bicarb gtt ordered at 50 cc/ hr but not yet started. BP soft ( Previously had been high) Fentanyl at 100 Precedex at 0.7 ABG 5/26 at 2 am>> 7.27., 23.7, 91, TCO2 12, Bicarb 11.1,  Plan: Start Bicarb gtt now at 125/hr 500 cc LR bolus ( CXR clear)>> Has had 2 L Fluid since admission 5/24  CMET now  ABG now Repeat lactate in 3 hours Wean sedation to lowest effective dose.  Bevelyn Ngo, AGACNP-BC [facility] Pager [phone] 01/17/2018 8:41 AM

## 2018-01-17 NOTE — Progress Notes (Signed)
CRITICAL VALUE ALERT  Critical Value:  Calcium 6.1, Troponin 0.22  Date & Time Notied:  01/17/2018 215  Provider Notified: Pola Corn  Orders Received/Actions taken: No new orders at this time

## 2018-01-17 NOTE — Progress Notes (Signed)
RT NOTE:  Pt transported to 2H04 without event. Report given to [facility], RRT.

## 2018-01-17 NOTE — Consult Note (Addendum)
.. ..  Name: Donna Short MRN: [id number] DOB: 18-Jan-1991    ADMISSION DATE:  01/16/2018 CONSULTATION DATE:  01/17/2018  REFERRING MD :  TRH  CHIEF COMPLAINT:  Seizures, Altered mental status  BRIEF PATIENT DESCRIPTION: 27 yr old female with PMHx HTN, Asthma, Seizures and known ETOH abuse presents with abdominal pain, nausea, vomiting . CTA/P shows pancreatitis. HTN and Tachycardic last ETOH was on 5/21=5/22 now s/p seizure obtunded. Intubated. PCCM consulted for mgmt  SIGNIFICANT EVENTS  Acute pancreatitis HTN and Tachy- poss. Acute ETOH withdrawal Seizure  Intubated  STUDIES:  CTA/P:  IMPRESSION: 1. Findings indicative of acute pancreatitis. Pancreas appears edematous with peripancreatic fluid. There is fluid tracking throughout the anterolateral abdominal regions with mesenteric thickening in these areas felt to be due to pancreatitis. There is localized partially loculated ascites in the dependent portion of the pelvis. 2. Wall thickening involving multiple loops of small and large bowel, likely due to enterocolitis secondary to inflammation from the peripancreatic fluid/pancreatitis. No bowel obstruction. 3. No frank abscess in the abdomen or pelvis. Appendix appears normal. 4.  No renal or ureteral calculus.  No hydronephrosis. 5. Question sludge in gallbladder. Gallbladder wall does not appear appreciably thickened. 6.  Hepatic steatosis.  CXR post intubation: pending   HISTORY OF PRESENT ILLNESS:  (HPI obtained from the accounts of other providers, EMR, patient prior to seizure) 27 yr old fmeale with PMHx ETOH, HTN Seizures and a h/o binge drinking ( last ETOH use 5/21) Pt presented to the [facility] on 01/16/18 she had initial complaints on nausea, vomiting and abdominal pain. Per the Triage note her symptoms began 3 days ago and were accompanied with frequent urination but she denied fevers. She was nauseous on 5/21 and was seen and given antiemetics she was unable to  tolerate PO food or drink despite the antiemetic therapy. Her pain became much worse around 11PM on 5/24 and that is when the vomiting started.  She received empiric antibiotics, labs were sent ad pt was started on IVF. Only one set of cultures obtained before antibiotics. She was admitted to the hospitalist.   Over night noted to became Hypertensive, Tachycardic, and diaphoretic. Started on CIWA protocol. At 0500 patient became less responsive with snorous respirations with noted seizure requiring intubation and transfer to ICU.    PAST MEDICAL HISTORY :   has a past medical history of Asthma, Hypertension, and Seizures ([place]).  has a past surgical history that includes No past surgeries. Prior to Admission medications   Medication Sig Start Date End Date Taking? Authorizing Provider  aspirin-acetaminophen-caffeine (EXCEDRIN MIGRAINE) [phone] MG tablet Take 2 tablets by mouth every 6 (six) hours as needed for headache or migraine.   Yes [provider]  ondansetron (ZOFRAN-ODT) 4 MG disintegrating tablet Take 1 tablet (4 mg total) by mouth every 8 (eight) hours as needed for nausea or vomiting. 01/12/18  Yes Vanessa Kick, MD  lamoTRIgine (LAMICTAL) 100 MG tablet Take 1 tablet (100 mg total) by mouth 2 (two) times daily. Patient not taking: Reported on 01/16/2018 01/22/16   Marcial Pacas, MD   Allergies  Allergen Reactions  . Benadryl [Diphenhydramine Hcl (Sleep)] Rash  . Latex Rash  . Penicillins Itching and Rash    Has patient had a PCN reaction causing immediate rash, facial/tongue/throat swelling, SOB or lightheadedness with hypotension: Yes Has patient had a PCN reaction causing severe rash involving mucus membranes or skin necrosis: No Has patient had a PCN reaction that required hospitalization: Yes Has patient  had a PCN reaction occurring within the last 10 years: No If all of the above answers are "NO", then may proceed with Cephalosporin use.;  . Shrimp [Shellfish Allergy]  Hives and Rash    FAMILY HISTORY:  family history includes Breast cancer in her mother; Diabetes in her father. SOCIAL HISTORY:  reports that she has quit smoking. Her smoking use included cigarettes. She uses smokeless tobacco. She reports that she does not drink alcohol or use drugs.  REVIEW OF SYSTEMS:  (At time of evaluation pt is intubated) Constitutional: Negative for fever, chills, weight loss, malaise/fatigue and diaphoresis.  HENT: Negative for hearing loss, ear pain, nosebleeds, congestion, sore throat, neck pain, tinnitus and ear discharge.   Eyes: Negative for blurred vision, double vision, photophobia, pain, discharge and redness.  Respiratory: Negative for cough, hemoptysis, sputum production, shortness of breath, wheezing and stridor.   Cardiovascular: Negative for chest pain, palpitations, orthopnea, claudication, leg swelling and PND.  Gastrointestinal: Negative for heartburn, nausea, vomiting, abdominal pain, diarrhea, constipation, blood in stool and melena.  Genitourinary: Negative for dysuria, urgency, frequency, hematuria and flank pain.  Musculoskeletal: Negative for myalgias, back pain, joint pain and falls.  Skin: Negative for itching and rash.  Neurological: Negative for dizziness, tingling, tremors, sensory change, speech change, focal weakness, seizures, loss of consciousness, weakness and headaches.  Endo/Heme/Allergies: Negative for environmental allergies and polydipsia. Does not bruise/bleed easily.  SUBJECTIVE:   VITAL SIGNS: Temp:  [97.9 F (36.6 C)-98.2 F (36.8 C)] 97.9 F (36.6 C) (05/25 1810) Pulse Rate:  [111-170] 167 (05/26 0554) Resp:  [21-32] 26 (05/26 0554) BP: (68-181)/(0-150) 161/131 (05/26 0525) SpO2:  [96 %-100 %] 100 % (05/26 0554)  PHYSICAL EXAMINATION: General:  Thin female. sedated Neuro:  Pupils pinpoint sluggishly reactive + gag, + corneal HEENT:  ETT in oropharynx Cardiovascular:  S1 and S2 appreciated tachy Lungs:  Coarse  breath sounds bilaterally Abdomen:  Mildly distended no fullness to flanks, + BS Musculoskeletal:  No gross deformities appreciated no lower ext edema Skin:  Grossly intact  Recent Labs  Lab 01/16/18 1700 01/16/18 1728 01/17/18 0041  NA 128* 130* 131*  K 4.7 4.4 4.9  CL 102 100* 100*  CO2  --  11* 9*  BUN 32* 26* 26*  CREATININE 1.90* 2.01* 1.75*  GLUCOSE 87 82 158*   Recent Labs  Lab 01/16/18 1700 01/16/18 1728 01/17/18 0314  HGB 16.3* 12.2 14.3  HCT 48.0* 34.9* 40.6  WBC  --  15.8* 11.6*  PLT  --  79* 72*   Ct Abdomen Pelvis Wo Contrast  Result Date: 01/16/2018 CLINICAL DATA:  Abdominal pain radiating to back EXAM: CT ABDOMEN AND PELVIS WITHOUT CONTRAST TECHNIQUE: Multidetector CT imaging of the abdomen and pelvis was performed following the standard protocol without oral or IV contrast. COMPARISON:  None. FINDINGS: Lower chest: Lung bases are clear. Hepatobiliary: There is a patent steatosis. No focal liver lesions are appreciable. Gallbladder wall is not appreciably thickened. There is suspected sludge in gallbladder. There is no biliary duct dilatation. Pancreas: There is mild generalized prominence of the pancreas with what is felt to be a degree edema throughout the pancreas. There is surrounding peripancreatic fluid. Fluid tracks on the right laterally into the right lateral conal fascia region. There is loculated ascites throughout the anterior abdominal region with mesenteric thickening throughout the anterolateral abdominal wall regions extending inferior to the level of the pelvis. There is moderate ascites in the dependent portion of the pelvis as well which may partially  loculated. Spleen: No splenic lesions are evident. Adrenals/Urinary Tract: Adrenals bilaterally appear unremarkable. Kidneys bilaterally show no evident mass or hydronephrosis on either side. There is no renal or ureteral calculus on either side. Urinary bladder is midline with wall thickness within normal  limits. Stomach/Bowel: There is mild wall thickening in multiple loops of small bowel, likely due to enteritis secondary to the pancreatitis. Similar changes are noted in the ascending and descending colon. There is no evident bowel obstruction. No free air or portal venous air. Vascular/Lymphatic: There is no abdominal aortic aneurysm. No vascular lesions are evident. There is no appreciable adenopathy in the abdomen or pelvis. Reproductive: Uterus is anteverted.  No evident pelvic mass. Other: Appendix appears normal. There is no evident abscess in the abdomen or pelvis. Musculoskeletal: No blastic or lytic bone lesions. No intramuscular or abdominal wall lesions are evident. IMPRESSION: 1. Findings indicative of acute pancreatitis. Pancreas appears edematous with peripancreatic fluid. There is fluid tracking throughout the anterolateral abdominal regions with mesenteric thickening in these areas felt to be due to pancreatitis. There is localized partially loculated ascites in the dependent portion of the pelvis. 2. Wall thickening involving multiple loops of small and large bowel, likely due to enterocolitis secondary to inflammation from the peripancreatic fluid/pancreatitis. No bowel obstruction. 3. No frank abscess in the abdomen or pelvis. Appendix appears normal. 4.  No renal or ureteral calculus.  No hydronephrosis. 5. Question sludge in gallbladder. Gallbladder wall does not appear appreciably thickened. 6.  Hepatic steatosis. Electronically Signed   By: Lowella Grip III M.D.   On: 01/16/2018 18:08   STUDIES:  CT A/P 5/25 > 1. Findings indicative of acute pancreatitis. Pancreas appears edematous with peripancreatic fluid. There is fluid tracking throughout the anterolateral abdominal regions with mesenteric thickening in these areas felt to be due to pancreatitis. There is localized partially loculated ascites in the dependent portion of the pelvis. Wall thickening involving multiple loops of  small and large bowel, likely due to enterocolitis secondary to inflammation from the peripancreatic fluid/pancreatitis. No bowel obstruction. No frank abscess in the abdomen or pelvis. Appendix appears Normal. No renal or ureteral calculus. No hydronephrosis. Question sludge in gallbladder. Gallbladder wall does not appear appreciably thickened. Hepatic steatosis.  CULTURES: Blood 5/25 >>   ANTIBIOTICS: Zosyn 5/25 >>  SIGNIFICANT EVENTS: 5/25 > Presents to ED   LINES/TUBES: ETT 5/26 >>   ASSESSMENT / PLAN: NEURO: Acute encephalopathy- metabolic and post ictal Alcohol Withdrawal with seizure CIWA protocol  Neurochecks Q 1 Start on precedex and fentanyl Avoid propofol- lipase markedly elevated RASS goal: 0/-1 Wean Precedex to achieve RASS Add Fentanyl gtt Folic Acid/Thimaine  Ativan PRN  Continue Keppra    CARDIAC: H/o HTN Elevated BP- may be secondary to ETOH withdrawl Trend trops and EKGs Monitor QTC HTN secondary to active withdrawal May need a CVL ECHO to assess LVEF Trend Lactate Insert an A-line- for more accurate BP monitoring ( non invasive pulse pressures very narrow- ?inaccurate)  PULMONARY: Acute hypoxic respiratory failure Intubated on mechanical ventilation TV 8cc/kg Lung fields on CXR clear no sign of aspiration ETT pull back by 2 Trend ABG/CXR Pulmonary Hygiene  Daily SBT/WUA   ID: Acute Pancreatitis -  Pt has Leukocytosis but has been afebrile F/u blood cultures Was started on broad spectrum antibiotics by ED Send PCT Trend WBC, fever curve and lactate Deescalate abx when possible   Endocrine: No previous h/o insulin dependence Will be NPO If BG >135m/dl will start ISS Check TSH  GI: Acute Pancreatitis secondary to ETOH Has sludge in galbladder but Alk phos and transaminases not markedly elevated Plan:  Keep NPO  Continue aggressive IVF resuscitation CTA/P reviewed by me personally   Heme: Thrombocytopenia Hgb  14.3 Hct 40 Plts 72 Dehydrated- continue volume resuscitation Avoid medications that may worsen Platelets Get Peripheral smear F/u Blood cx-. R/o Gram neg sepsis If Hgb<7 transfuse PRBCs Type & Cross Pt has no active bleeding And no h/o coagulopathy DVT PPx-> Hep [place] and SCDs  RENAL Anion Gap metabolic Acidosis- secondary to Alcoholic ketoacidosis and Lactic acidosis Acute Kidney injury Plan: Continue on IVF in inflammatory state secondary to pancreatitis Given kidney injury - poor prognosis in setting of Pancreatitis Trend Lactate Check urine for ketones Start on Bicarb ggt Lab Results  Component Value Date   CREATININE 1.75 (H) 01/17/2018   CREATININE 2.01 (H) 01/16/2018   CREATININE 1.90 (H) 01/16/2018  Replace electrolytes as needed.       I, Dr Seward Carol have personally reviewed patient's available data, including medical history, events of note, physical examination and test results as part of my evaluation. I have discussed with NP eubanks  and other care providers such as pharmacist, RN and RRT. The patient is critically ill with multiple organ systems failure and requires high complexity decision making for assessment and support, frequent evaluation and titration of therapies, application of advanced monitoring technologies and extensive interpretation of multiple databases. Critical Care Time devoted to patient care services described in this note is 61  Minutes. This time reflects time of care of this signee Dr Seward Carol. This critical care time does not reflect procedure time, or teaching time or supervisory time of NP Eubanks but could involve care discussion time    DISPOSITION:ICU CC TIME:63 mins PROGNOSIS:Guarded FAMILY:none at bedside at time of evaluation CODE STATUS: FULL   Signed Dr Seward Carol Pulmonary Critical Care Locums  01/17/2018, 5:59 AM

## 2018-01-17 NOTE — ED Provider Notes (Signed)
INTUBATION Performed by: Antony Madura  Required items: required blood products, implants, devices, and special equipment available Patient identity confirmed: provided demographic data and hospital-assigned identification number Time out: Immediately prior to procedure a "time out" was called to verify the correct patient, procedure, equipment, support staff and site/side marked as required.  Indications: altered mental status, snoring respi  Intubation method: Glidescope Laryngoscopy   Preoxygenation: BVM  Sedatives:  Etomidate Paralytic:  Succinylcholine  Tube Size: 7.5 cuffed  Post-procedure assessment: chest rise and ETCO2 monitor Breath sounds: equal and absent over the epigastrium Tube secured with: ETT holder Chest x-ray interpreted by radiologist and me.  Chest x-ray findings: Endotracheal tube in appropriate position  Patient tolerated the procedure well with no immediate complications.      Antony Madura, PA-C 01/17/18 [id number]    Azalia Bilis, MD 01/17/18 [phone]

## 2018-01-17 NOTE — Procedures (Signed)
Arterial Catheter Insertion Procedure Note Donna Short [id number] [birth year]  Procedure: Insertion of Arterial Catheter  Indications: Blood pressure monitoring and Frequent blood sampling  Procedure Details Consent: Risks of procedure as well as the alternatives and risks of each were explained to the (patient/caregiver).  Consent for procedure obtained. Time Out: Verified patient identification, verified procedure, site/side was marked, verified correct patient position, special equipment/implants available, medications/allergies/relevent history reviewed, required imaging and test results available.  Performed  Maximum sterile technique was used including antiseptics, cap, gloves, gown, hand hygiene, mask and sheet. Skin prep: Chlorhexidine; local anesthetic administered 20 gauge catheter was inserted into right radial artery using the Seldinger technique. ULTRASOUND GUIDANCE USED: NO Evaluation Blood flow good; BP tracing good. Complications: No apparent complications.   Rayburn Felt 01/17/2018

## 2018-01-17 NOTE — Progress Notes (Signed)
Pt placed on non-invasive clear site monitoring per MD request.

## 2018-01-17 NOTE — ED Notes (Signed)
Recollect sent to main lab in peds tube

## 2018-01-17 NOTE — Progress Notes (Signed)
RT NOTE:  RT attempted to collect ABG, however, sample was not enough to run on ISTAT. 2H RRT aware, will collect ASAP.

## 2018-01-17 NOTE — Progress Notes (Signed)
O: Interim lactate is down to 4.3 with bicarb gtt PCT is 1.16 Calcium is 5.9 mg.dL>> corrects to 1.02 ( Albumin is 2.2)>>  BP is soft, MAP is 68 100 cc urine output Getting 250 cc / hr with bicarb gtt and LR  Plan: Will replete with 1 gram calcium IV and recheck in am Minimize sedation Repeat ABG and Lactate due at 12 noon.

## 2018-01-17 NOTE — ED Notes (Signed)
Blood clotted, recollect.

## 2018-01-17 NOTE — Progress Notes (Signed)
  Echocardiogram 2D Echocardiogram has been performed.  Donna Short F 01/17/2018, 3:34 PM

## 2018-01-17 NOTE — ED Notes (Signed)
Pt in bathroom

## 2018-01-18 ENCOUNTER — Inpatient Hospital Stay (HOSPITAL_COMMUNITY): Payer: Managed Care, Other (non HMO)

## 2018-01-18 LAB — BASIC METABOLIC PANEL
ANION GAP: 9 (ref 5–15)
ANION GAP: 10 (ref 5–15)
ANION GAP: 8 (ref 5–15)
BUN: 29 mg/dL — ABNORMAL HIGH (ref 6–20)
BUN: 28 mg/dL — ABNORMAL HIGH (ref 6–20)
BUN: 29 mg/dL — ABNORMAL HIGH (ref 6–20)
CHLORIDE: 103 mmol/L (ref 101–111)
CHLORIDE: 103 mmol/L (ref 101–111)
CO2: 24 mmol/L (ref 22–32)
CO2: 26 mmol/L (ref 22–32)
CO2: 24 mmol/L (ref 22–32)
CREATININE: 0.96 mg/dL (ref 0.44–1.00)
CREATININE: 0.98 mg/dL (ref 0.44–1.00)
Calcium: 6.1 mg/dL — CL (ref 8.9–10.3)
Calcium: 6.1 mg/dL — CL (ref 8.9–10.3)
Calcium: 6.8 mg/dL — ABNORMAL LOW (ref 8.9–10.3)
Chloride: 104 mmol/L (ref 101–111)
Creatinine, Ser: 1.02 mg/dL — ABNORMAL HIGH (ref 0.44–1.00)
GFR calc Af Amer: >60 mL/min (ref >60)
GFR calc non Af Amer: >60 mL/min (ref >60)
GFR calc non Af Amer: >60 mL/min (ref >60)
GFR calc non Af Amer: >60 mL/min (ref >60)
GLUCOSE: 154 mg/dL — AB (ref 65–99)
Glucose, Bld: 162 mg/dL — ABNORMAL HIGH (ref 65–99)
Glucose, Bld: 117 mg/dL — ABNORMAL HIGH (ref 65–99)
POTASSIUM: 3.4 mmol/L — AB (ref 3.5–5.1)
POTASSIUM: 3.4 mmol/L — AB (ref 3.5–5.1)
POTASSIUM: 3.7 mmol/L (ref 3.5–5.1)
SODIUM: 139 mmol/L (ref 135–145)
SODIUM: 135 mmol/L (ref 135–145)
Sodium: 137 mmol/L (ref 135–145)

## 2018-01-18 LAB — LIPASE, BLOOD: Lipase: 363 U/L — ABNORMAL HIGH (ref 11–51)

## 2018-01-18 LAB — COMPREHENSIVE METABOLIC PANEL
ALK PHOS: 22 U/L — AB (ref 38–126)
ALT: 21 U/L (ref 14–54)
AST: 55 U/L — ABNORMAL HIGH (ref 15–41)
Albumin: 1.8 g/dL — ABNORMAL LOW (ref 3.5–5.0)
Anion gap: 8 (ref 5–15)
BILIRUBIN TOTAL: 0.9 mg/dL (ref 0.3–1.2)
BUN: 28 mg/dL — ABNORMAL HIGH (ref 6–20)
CALCIUM: 6.2 mg/dL — AB (ref 8.9–10.3)
CO2: 25 mmol/L (ref 22–32)
Chloride: 104 mmol/L (ref 101–111)
Creatinine, Ser: 1 mg/dL (ref 0.44–1.00)
GFR calc non Af Amer: >60 mL/min (ref >60)
Glucose, Bld: 112 mg/dL — ABNORMAL HIGH (ref 65–99)
Potassium: 3.5 mmol/L (ref 3.5–5.1)
Sodium: 137 mmol/L (ref 135–145)
TOTAL PROTEIN: 4.3 g/dL — AB (ref 6.5–8.1)

## 2018-01-18 LAB — POCT I-STAT 3, ART BLOOD GAS (G3+)
ACID-BASE DEFICIT: 1 mmol/L (ref 0.0–2.0)
Acid-base deficit: 1 mmol/L (ref 0.0–2.0)
BICARBONATE: 24.8 mmol/L (ref 20.0–28.0)
Bicarbonate: 23.3 mmol/L (ref 20.0–28.0)
Bicarbonate: 24.4 mmol/L (ref 20.0–28.0)
O2 SAT: 97 %
O2 SAT: 97 %
O2 Saturation: 97 %
PCO2 ART: 42 mmHg (ref 32.0–48.0)
PH ART: 7.398 (ref 7.350–7.450)
PO2 ART: 93 mmHg (ref 83.0–108.0)
Patient temperature: 98.5
TCO2: 24 mmol/L (ref 22–32)
TCO2: 26 mmol/L (ref 22–32)
TCO2: 26 mmol/L (ref 22–32)
pCO2 arterial: 37.7 mmHg (ref 32.0–48.0)
pCO2 arterial: 44.4 mmHg (ref 32.0–48.0)
pH, Arterial: 7.378 (ref 7.350–7.450)
pH, Arterial: 7.348 — ABNORMAL LOW (ref 7.350–7.450)
pO2, Arterial: 87 mmHg (ref 83.0–108.0)
pO2, Arterial: 101 mmHg (ref 83.0–108.0)

## 2018-01-18 LAB — CBC
HEMATOCRIT: 26.1 % — AB (ref 36.0–46.0)
HEMOGLOBIN: 9.4 g/dL — AB (ref 12.0–15.0)
MCH: 37.6 pg — AB (ref 26.0–34.0)
MCHC: 36 g/dL (ref 30.0–36.0)
MCV: 104.4 fL — ABNORMAL HIGH (ref 78.0–100.0)
Platelets: 64 10*3/uL — ABNORMAL LOW (ref 150–400)
RBC: 2.5 MIL/uL — ABNORMAL LOW (ref 3.87–5.11)
RDW: 11.9 % (ref 11.5–15.5)
WBC: 4 10*3/uL (ref 4.0–10.5)

## 2018-01-18 LAB — LACTIC ACID, PLASMA
LACTIC ACID, VENOUS: 2.3 mmol/L — AB (ref 0.5–1.9)
LACTIC ACID, VENOUS: 1.8 mmol/L (ref 0.5–1.9)

## 2018-01-18 LAB — PROCALCITONIN: PROCALCITONIN: 2.24 ng/mL

## 2018-01-18 LAB — SAVE SMEAR

## 2018-01-18 LAB — GLUCOSE, CAPILLARY
GLUCOSE-CAPILLARY: 138 mg/dL — AB (ref 65–99)
Glucose-Capillary: 154 mg/dL — ABNORMAL HIGH (ref 65–99)
Glucose-Capillary: 121 mg/dL — ABNORMAL HIGH (ref 65–99)
Glucose-Capillary: 100 mg/dL — ABNORMAL HIGH (ref 65–99)
Glucose-Capillary: 75 mg/dL (ref 65–99)

## 2018-01-18 LAB — PROTIME-INR
INR: 1.12
PROTHROMBIN TIME: 14.3 s (ref 11.4–15.2)

## 2018-01-18 LAB — AMYLASE: AMYLASE: 307 U/L — AB (ref 28–100)

## 2018-01-18 LAB — PHOSPHORUS: Phosphorus: 2 mg/dL — ABNORMAL LOW (ref 2.5–4.6)

## 2018-01-18 LAB — BLOOD PRODUCT ORDER (VERBAL) VERIFICATION

## 2018-01-18 LAB — MAGNESIUM: Magnesium: 2.3 mg/dL (ref 1.7–2.4)

## 2018-01-18 LAB — TROPONIN I: Troponin I: 0.25 ng/mL (ref <0.03)

## 2018-01-18 MED ORDER — LACTATED RINGERS IV SOLN
INTRAVENOUS | Status: DC
  Administered 2018-01-18 – 2018-01-19 (×2): via INTRAVENOUS

## 2018-01-18 MED ORDER — SODIUM CHLORIDE 0.9 % IV BOLUS: 500.0000 mL | Freq: Once | INTRAVENOUS | Status: DC

## 2018-01-18 MED ORDER — SODIUM CHLORIDE 0.9 % IV SOLN
2.0000 g | Freq: Once | INTRAVENOUS | Status: AC
  Administered 2018-01-18: 2 g via INTRAVENOUS
  Filled 2018-01-18: qty 20

## 2018-01-18 MED ORDER — IPRATROPIUM-ALBUTEROL 0.5-2.5 (3) MG/3ML IN SOLN: 3.0000 mL | Freq: Four times a day (QID) | RESPIRATORY_TRACT | Status: DC | PRN

## 2018-01-18 MED ORDER — SODIUM CHLORIDE 0.9 % IV BOLUS
500.0000 mL | Freq: Once | INTRAVENOUS | Status: AC
  Administered 2018-01-18: 500 mL via INTRAVENOUS

## 2018-01-18 MED ORDER — POTASSIUM CHLORIDE 10 MEQ/50ML IV SOLN
10.0000 meq | INTRAVENOUS | Status: AC
  Administered 2018-01-18 (×3): 10 meq via INTRAVENOUS
  Filled 2018-01-18 (×3): qty 50

## 2018-01-18 MED ORDER — SODIUM CHLORIDE 0.9% FLUSH
10.0000 mL | INTRAVENOUS | Status: DC | PRN
  Administered 2018-01-28: 10 mL
  Filled 2018-01-18: qty 40

## 2018-01-18 MED ORDER — SODIUM CHLORIDE 0.9 % IV SOLN
1.0000 g | Freq: Once | INTRAVENOUS | Status: AC
  Administered 2018-01-18: 1 g via INTRAVENOUS
  Filled 2018-01-18: qty 10

## 2018-01-18 MED ORDER — FAMOTIDINE IN NACL 20-0.9 MG/50ML-% IV SOLN: 20.0000 mg | Freq: Two times a day (BID) | INTRAVENOUS | Status: DC

## 2018-01-18 MED ORDER — SODIUM CHLORIDE 0.9% FLUSH
10.0000 mL | Freq: Two times a day (BID) | INTRAVENOUS | Status: DC
  Administered 2018-01-18 – 2018-01-19 (×4): 10 mL
  Administered 2018-01-20: 20 mL
  Administered 2018-01-21 – 2018-01-29 (×12): 10 mL

## 2018-01-18 MED ORDER — CHLORHEXIDINE GLUCONATE CLOTH 2 % EX PADS
6.0000 | MEDICATED_PAD | Freq: Every day | CUTANEOUS | Status: DC
  Administered 2018-01-19: 6 via TOPICAL

## 2018-01-18 NOTE — Progress Notes (Signed)
CRITICAL VALUE ALERT  Critical Value:  Lactic Acid 2.3  Calcium 6.1  Provider Notified: Kandice Robinsons, NP  Orders Received/Actions taken: NP at bedside- see Mar for new orders

## 2018-01-18 NOTE — Progress Notes (Signed)
  Progress Note: General Surgery Service   Assessment/Plan: Patient Active Problem List   Diagnosis Date Noted  . Metabolic acidosis   . Sepsis (HCC)   . Alcohol withdrawal seizure, uncomplicated (HCC)   . Hypotension due to hypovolemia   . Pancreatitis, acute 01/16/2018  . Hyponatremia 01/16/2018  . ARF (acute renal failure) (HCC) 01/16/2018  . Pancreatitis, alcoholic, acute 01/16/2018  . Seizures (HCC) 12/19/2015  alcoholic pancreatitis with respiratory failure, initial concern for compartment syndrome -creatinine improved and continued urinary output -respiratory pressures not elevated -I do not think this patient has compartment syndrome for the above reasons, if her respiratory or renal status worsens, consider paralysis and recheck bladder pressures -call for new concerns    LOS: 2 days  Chief Complaint/Subjective: Bladder pressures 18-20 overnight  Objective: Vital signs in last 24 hours: Temp:  [97.6 F (36.4 C)-98.4 F (36.9 C)] 98.4 F (36.9 C) (05/27 0756) Pulse Rate:  [101-137] 101 (05/27 0600) Resp:  [12-26] 18 (05/27 0600) BP: (74-103)/(53-85) 90/74 (05/27 0600) SpO2:  [94 %-100 %] 98 % (05/27 0600) Arterial Line BP: (83-117)/(52-81) 101/65 (05/27 0600) FiO2 (%):  [40 %-60 %] 40 % (05/27 0313) Weight:  [60.4 kg (133 lb 2.5 oz)] 60.4 kg (133 lb 2.5 oz) (05/27 0406) Last BM Date: 01/17/18  Intake/Output from previous day: 05/26 0701 - 05/27 0700 In: 7861.1 [I.V.:3702.1; IV Piggyback:4109] Out: 705 [Urine:505; Emesis/NG output:200] Intake/Output this shift: No intake/output data recorded.  Gen: intubated sedated  Lungs: assisted, not reaching alarm for peak pressures, getting adequate volumes  Cardiovascular: tachycardic  Abd: firm, nondistended  Extremities: +edema  Neuro: sedated  Studies/Results:  BMP Latest Ref Rng & Units 01/18/2018 01/17/2018 01/17/2018  Glucose 65 - 99 mg/dL 161(W) 960(A) 540(J)  BUN 6 - 20 mg/dL 81(X) 91(Y) 78(G)    Creatinine 0.44 - 1.00 mg/dL 9.56(O) 1.30(Q) 6.57(Q)  Sodium 135 - 145 mmol/L 137 134(L) 135  Potassium 3.5 - 5.1 mmol/L 3.4(L) 3.7 3.8  Chloride 101 - 111 mmol/L 104 104 112(H)  CO2 22 - 32 mmol/L 24 18(L) 10(L)  Calcium 8.9 - 10.3 mg/dL 6.1(LL) 6.1(LL) 5.9(LL)    Rodman Pickle, MD [facility]

## 2018-01-18 NOTE — Progress Notes (Signed)
Dr. Isaiah Serge notified of increasing tachycardia.  Rhythm is sinus tach and given updates on I&O's and CVP.  New orders received for EKG and NS bolus.

## 2018-01-18 NOTE — Progress Notes (Signed)
eLink Physician-Brief Progress Note Patient Name: Donna Short DOB: 1990/10/03 MRN: [id number]   Date of Service  01/18/2018  HPI/Events of Note  Multiple issues: 1. Ca++ = 6.2 and 2. Request to replace Foley Catheter.    eICU Interventions  Will order: 1. Replace Ca++. 2. Reinsert Foley Catheter.      Intervention Category Major Interventions: Electrolyte abnormality - evaluation and management;Other:  Lenell Antu 01/18/2018, 9:13 PM

## 2018-01-18 NOTE — Progress Notes (Signed)
CRITICAL VALUE ALERT  Critical Value:  Calcium 6.2  Date & Time Notied: 01/18/18; 2030  Provider Notified: Burman Riis  Orders Received/Actions taken: replace Ca via IVPB

## 2018-01-18 NOTE — Progress Notes (Signed)
RT to adjust vent settings as ordered.  See RT note

## 2018-01-18 NOTE — Consult Note (Addendum)
.. ..  Name: Donna Short MRN: [id number] DOB: [birth year]    ADMISSION DATE:  01/16/2018 CONSULTATION DATE:  01/17/2018  REFERRING MD :  TRH  CHIEF COMPLAINT:  Seizures, Altered mental status  BRIEF PATIENT DESCRIPTION: 27 yr old female with PMHx HTN, Asthma, Seizures and known ETOH abuse presents with abdominal pain, nausea, vomiting . CTA/P shows pancreatitis. HTN and Tachycardic last ETOH was on 5/21=5/22 now s/p seizure obtunded. Intubated. PCCM consulted for mgmt  SIGNIFICANT EVENTS  Acute pancreatitis HTN and Tachy- poss. Acute ETOH withdrawal Seizure  Intubated  STUDIES:  CTA/P:  IMPRESSION: 1. Findings indicative of acute pancreatitis. Pancreas appears edematous with peripancreatic fluid. There is fluid tracking throughout the anterolateral abdominal regions with mesenteric thickening in these areas felt to be due to pancreatitis. There is localized partially loculated ascites in the dependent portion of the pelvis. 2. Wall thickening involving multiple loops of small and large bowel, likely due to enterocolitis secondary to inflammation from the peripancreatic fluid/pancreatitis. No bowel obstruction. 3. No frank abscess in the abdomen or pelvis. Appendix appears normal. 4.  No renal or ureteral calculus.  No hydronephrosis. 5. Question sludge in gallbladder. Gallbladder wall does not appear appreciably thickened. 6.  Hepatic steatosis.  CXR 5/26>>  New left subclavian central venous line is well positioned, tip at the caval atrial junction. No pneumothorax. New left lung base opacity, most likely atelectasis.  Consider pneumonia if there are consistent clinical findings.  HISTORY OF PRESENT ILLNESS:  (HPI obtained from the accounts of other providers, EMR, patient prior to seizure) 27 yr old fmeale with PMHx ETOH, asthma, HTN Seizures and a h/o binge drinking ( last ETOH use 5/21) Pt presented to the [facility] on 01/16/18 she had initial complaints on nausea, vomiting and  abdominal pain. Per the Triage note her symptoms began 3 days ago and were accompanied with frequent urination but she denied fevers. She was nauseous on 5/21 and was seen and given antiemetics she was unable to tolerate PO food or drink despite the antiemetic therapy. Her pain became much worse around 11PM on 5/24 and that is when the vomiting started.  She received empiric antibiotics, labs were sent ad pt was started on IVF. Only one set of cultures obtained before antibiotics. She was admitted to the hospitalist.   Over night 5/25-5/26 noted to became Hypertensive, Tachycardic, and diaphoretic. Started on CIWA protocol. At 0500 patient became less responsive with snorous respirations with noted seizure requiring intubation and transfer to ICU.   SUBJECTIVE:  Sedated and intubated Levo @ 1 mcg Bicarb gtt @ 75 Fent @ 200 Precedex @ 0.8 Minimal UO>> Anuric Renal Failure>> Consulting renal Abdominal distension with increasing bladder pressures>> Surgery consulted 5/26   VITAL SIGNS: Temp:  [97.6 F (36.4 C)-98.4 F (36.9 C)] 98.4 F (36.9 C) (05/27 0756) Pulse Rate:  [101-131] 108 (05/27 0817) Resp:  [12-26] 16 (05/27 0817) BP: (74-103)/(53-85) 85/67 (05/27 0817) SpO2:  [94 %-100 %] 98 % (05/27 0817) Arterial Line BP: (83-117)/(52-81) 101/65 (05/27 0600) FiO2 (%):  [40 %] 40 % (05/27 0817) Weight:  [133 lb 2.5 oz (60.4 kg)] 133 lb 2.5 oz (60.4 kg) (05/27 0406)  PHYSICAL EXAMINATION: General:  Thin female. Sedated, intubated, full vent support Neuro:Sedated,  Pupils are pinpoint and sluggish to react ,purposeful movement when sedation lightened. MAE x 4 HEENT:  ETT in oropharynx, NCAT, facial edema Cardiovascular:  S1, S2, RRR, No MRG Lungs: Bilateral chest excursion,  Coarse breath sounds bilaterally, rhonchi throughout Abdomen:  Distended, slightly firm, BS are not tympanic Musculoskeletal:  No gross deformities appreciated no lower ext edema Skin:  Warm dry and intact  Recent  Labs  Lab 01/17/18 0833 01/17/18 2048 01/18/18 0351  NA 135 134* 137  K 3.8 3.7 3.4*  CL 112* 104 104  CO2 10* 18* 24  BUN 23* 28* 29*  CREATININE 1.15* 1.18* 1.02*  GLUCOSE 162* 213* 154*   Recent Labs  Lab 01/17/18 0314 01/17/18 0819 01/18/18 0351  HGB 14.3 11.2* 9.4*  HCT 40.6 31.9* 26.1*  WBC 11.6* 8.7 4.0  PLT 72* 57* 64*   Ct Abdomen Pelvis Wo Contrast  Result Date: 01/16/2018 CLINICAL DATA:  Abdominal pain radiating to back EXAM: CT ABDOMEN AND PELVIS WITHOUT CONTRAST TECHNIQUE: Multidetector CT imaging of the abdomen and pelvis was performed following the standard protocol without oral or IV contrast. COMPARISON:  None. FINDINGS: Lower chest: Lung bases are clear. Hepatobiliary: There is a patent steatosis. No focal liver lesions are appreciable. Gallbladder wall is not appreciably thickened. There is suspected sludge in gallbladder. There is no biliary duct dilatation. Pancreas: There is mild generalized prominence of the pancreas with what is felt to be a degree edema throughout the pancreas. There is surrounding peripancreatic fluid. Fluid tracks on the right laterally into the right lateral conal fascia region. There is loculated ascites throughout the anterior abdominal region with mesenteric thickening throughout the anterolateral abdominal wall regions extending inferior to the level of the pelvis. There is moderate ascites in the dependent portion of the pelvis as well which may partially loculated. Spleen: No splenic lesions are evident. Adrenals/Urinary Tract: Adrenals bilaterally appear unremarkable. Kidneys bilaterally show no evident mass or hydronephrosis on either side. There is no renal or ureteral calculus on either side. Urinary bladder is midline with wall thickness within normal limits. Stomach/Bowel: There is mild wall thickening in multiple loops of small bowel, likely due to enteritis secondary to the pancreatitis. Similar changes are noted in the ascending and  descending colon. There is no evident bowel obstruction. No free air or portal venous air. Vascular/Lymphatic: There is no abdominal aortic aneurysm. No vascular lesions are evident. There is no appreciable adenopathy in the abdomen or pelvis. Reproductive: Uterus is anteverted.  No evident pelvic mass. Other: Appendix appears normal. There is no evident abscess in the abdomen or pelvis. Musculoskeletal: No blastic or lytic bone lesions. No intramuscular or abdominal wall lesions are evident. IMPRESSION: 1. Findings indicative of acute pancreatitis. Pancreas appears edematous with peripancreatic fluid. There is fluid tracking throughout the anterolateral abdominal regions with mesenteric thickening in these areas felt to be due to pancreatitis. There is localized partially loculated ascites in the dependent portion of the pelvis. 2. Wall thickening involving multiple loops of small and large bowel, likely due to enterocolitis secondary to inflammation from the peripancreatic fluid/pancreatitis. No bowel obstruction. 3. No frank abscess in the abdomen or pelvis. Appendix appears normal. 4.  No renal or ureteral calculus.  No hydronephrosis. 5. Question sludge in gallbladder. Gallbladder wall does not appear appreciably thickened. 6.  Hepatic steatosis. Electronically Signed   By: Lowella Grip III M.D.   On: 01/16/2018 18:08   Dg Chest Portable 1 View  Result Date: 01/17/2018 CLINICAL DATA:  Post intubation EXAM: PORTABLE CHEST 1 VIEW COMPARISON:  05/02/2015 FINDINGS: An endotracheal tube has been placed with tip measuring 1.8 cm above the carina. Enteric tube tip is off the field of view but below the left hemidiaphragm. Shallow inspiration. Heart size and  pulmonary vascularity are normal. Lungs are clear and expanded. Mediastinal contours appear intact. Thoracic scoliosis convex towards the right. IMPRESSION: Appliances appear in satisfactory position. No active pulmonary disease. Electronically Signed    By: Lucienne Capers M.D.   On: 01/17/2018 06:25   Dg Chest Port 1v Same Day  Result Date: 01/17/2018 CLINICAL DATA:  Displacement of central venous catheter. EXAM: PORTABLE CHEST 1 VIEW COMPARISON:  01/17/2018 at 6:08 a.m. FINDINGS: Since prior exam, a left subclavian central venous line has been placed. The tip projects at the caval atrial junction. No pneumothorax. The endotracheal tube and the nasal/orogastric tube are stable and well positioned. There is new left lung base opacity mostly silhouetting the left hemidiaphragm. This may reflect atelectasis or a rapidly evolving infection. Atelectasis is favored. IMPRESSION: 1. New left subclavian central venous line is well positioned, tip at the caval atrial junction. No pneumothorax. 2. New left lung base opacity, most likely atelectasis. Consider pneumonia if there are consistent clinical findings. Electronically Signed   By: Lajean Manes M.D.   On: 01/17/2018 12:27   STUDIES:  CT A/P 5/25 > 1. Findings indicative of acute pancreatitis. Pancreas appears edematous with peripancreatic fluid. There is fluid tracking throughout the anterolateral abdominal regions with mesenteric thickening in these areas felt to be due to pancreatitis. There is localized partially loculated ascites in the dependent portion of the pelvis. Wall thickening involving multiple loops of small and large bowel, likely due to enterocolitis secondary to inflammation from the peripancreatic fluid/pancreatitis. No bowel obstruction. No frank abscess in the abdomen or pelvis. Appendix appears Normal. No renal or ureteral calculus. No hydronephrosis. Question sludge in gallbladder. Gallbladder wall does not appear appreciably thickened. Hepatic steatosis.  CULTURES: Blood 5/25 >> 5/27>> Urine culture>>   ANTIBIOTICS: Zosyn 5/25 >>  SIGNIFICANT EVENTS: 5/25 > Presents to ED  5/26> Clinical worsening requiring CC consult 5/26> Pressors initiated 5/26> Surgical  Consult  LINES/TUBES: ETT 5/26 >> L [address] CVC 5/26>> R Art Line >> 5/26 R EJ >5/25   ASSESSMENT / PLAN: NEURO: Acute encephalopathy- metabolic and post ictal Alcohol Withdrawal with seizure CIWA protocol  Neurochecks Q 1 Continue  precedex and fentanyl Avoid propofol- lipase markedly elevated RASS goal: -1 to -2 Wean Precedex to achieve RASS Folic Acid/Thimaine  Ativan PRN  Continue Keppra  CT Head without contrast   CARDIAC: H/o HTN Elevated BP- may be secondary to ETOH withdrawal>> Now requiring pressors started 5/26 Non-invasive Flowtrack initiated 5/26 Elevated troponins>> ? Demand ischemia Echo> EF 55%, MV trivial regurg, RV systolic fxn mildly reduced, TV Mild regurg Trend trops and EKGs Monitor QTC MAP goal > 65 ECHO as above  Continue to Trend Lactate>> downtrending A-line- for more accurate BP monitoring  Non-Invasive Flow track monitoring  PULMONARY: Acute hypoxic respiratory failure Intubated on mechanical ventilation + 9 L TV 8cc/kg No weaning at present Maintain sats> 94% Continue Precedex/ Fentanyl Trend ABG/CXR Pulmonary Hygiene   ID: Acute Pancreatitis -  Leukocytosis>> resolved Procalcitation increase to 2.24 Plan: F/u blood cultures Continue ABX as above  Trend  PCT Trend WBC, fever curve and lactate Deescalate abx when possible Culture as is clinically indicated   Endocrine: No previous h/o insulin dependence Will be NPO If BG >139m/dl will start ISS Check TSH   GI: Acute Pancreatitis secondary to ETOH Has sludge in galbladder but Alk phos and transaminases not markedly elevated Abdominal distension Hypoalbuminemia  Concern for abdominal compartment syndrome 5/27>> APP 78 ( Bladder pressure 17, MAP 95) Plan:  Keep NPO Monitor bladder pressures Q 4 Follow Abdominal Perfusion pressures Q 4 ( Goal is > 60 mmHg) Trend LFT's Monitor CMET, amylase , lipase  Minimize IVF   Heme: Thrombocytopenia Bleeding gums 5/27 with  mouth care Anemia>>Hemodilutional Plan: Trend CBC's Avoid medications that may worsen Platelets Get Peripheral smear F/u Blood cx-. R/o Gram neg sepsis If Hgb<7 transfuse PRBCs Type & Cross done on admission Check PT.INR DVT PPx-> SCDs  RENAL Anion Gap metabolic Acidosis- secondary to Alcoholic ketoacidosis and Lactic acidosis Acute Kidney injury>> Concern for anuric RF as patient is making very little urine with worsening abdominal distension Given kidney injury - poor prognosis in setting of Pancreatitis Hypoalbuminemia Hypocalcemia>> corrects to 7.5 Hypokalemia Hypophos Plan: Conservative IVF with concern of abdominal compartment syndrome Abdominal US stat Renal Consult>> may need CVVH for volume removal  Avoid nephrotoxic medications and any contrast>> all imaging without contrast Check urine for ketones Continue  Bicarb ggt, will decrease to 50 cc and  recheck BMET at 1400 and determine if can be DC'd Monitor and replete electrolytes ( Replete K and Calcium 5/27) CMET daily  Monitor UO hourly  Lab Results  Component Value Date   CREATININE 1.02 (H) 01/18/2018   CREATININE 1.18 (H) 01/17/2018   CREATININE 1.15 (H) 01/17/2018  Replace electrolytes as needed.   DISPOSITION:ICU CC APP TIME:1 hour PROGNOSIS:Guarded FAMILY:Updated at bedside 5/27 CODE STATUS: [place], AGACNP-BC Pulmonary Critical Care Medicine Pager [phone]  01/18/2018, 8:26 AM

## 2018-01-18 NOTE — Consult Note (Signed)
Reason for Consult: Oliguria and AKI Referring Physician: Vaughan Browner, MD  Donna Short is an 27 y.o. female.  HPI: Pt is a 27 yo AAF with PMH significant for HTN, asthma, seizure disorder, and alcohol abuse who presented to [facility] on 01/16/18 with nausea vomiting, and abdominal pain.  She was also noted to have diaphoresis and evidence of acute pancreatitis with AKI (see trend in Scr below).  Her hospital course was complicated by altered mental status and likely aspiration pneumonia and sepsis with significant hypotension requiring pressors and intubation.  She has received large volumes of IVF's with only small urine output despite a falling creatinine.  There was concern for abdominal compartment syndrome and was seen by surgery, however her bladder pressure was 17 and her urine output has increased as well as BUN/Cr have been falling.  We were consulted to further evaluate her oliguria and AKI.  She also had hyponatremia and metabolic acidosis upon presentation, however they have improved with bicarb and IVF's.   Trend in Creatinine: Creatinine, Ser  Date/Time Value Ref Range Status  01/18/2018 08:37 AM 0.96 0.44 - 1.00 mg/dL Final  01/18/2018 03:51 AM 1.02 (H) 0.44 - 1.00 mg/dL Final  01/17/2018 08:48 PM 1.18 (H) 0.44 - 1.00 mg/dL Final  01/17/2018 08:33 AM 1.15 (H) 0.44 - 1.00 mg/dL Final  01/17/2018 06:37 AM 1.60 (H) 0.44 - 1.00 mg/dL Final  01/17/2018 12:41 AM 1.75 (H) 0.44 - 1.00 mg/dL Final  01/16/2018 05:28 PM 2.01 (H) 0.44 - 1.00 mg/dL Final  01/16/2018 05:00 PM 1.90 (H) 0.44 - 1.00 mg/dL Final  11/10/2016 06:04 PM 0.50 0.44 - 1.00 mg/dL Final  12/07/2015 06:29 PM 0.49 0.44 - 1.00 mg/dL Final  05/02/2015 10:23 PM 0.58 0.44 - 1.00 mg/dL Final    PMH:   Past Medical History:  Diagnosis Date  . Asthma   . Hypertension   . Seizures (HCC)     PSH:   Past Surgical History:  Procedure Laterality Date  . NO PAST SURGERIES      Allergies:  Allergies  Allergen Reactions  .  Benadryl [Diphenhydramine Hcl (Sleep)] Rash  . Latex Rash  . Penicillins Itching and Rash    Has patient had a PCN reaction causing immediate rash, facial/tongue/throat swelling, SOB or lightheadedness with hypotension: Yes Has patient had a PCN reaction causing severe rash involving mucus membranes or skin necrosis: No Has patient had a PCN reaction that required hospitalization: Yes Has patient had a PCN reaction occurring within the last 10 years: No If all of the above answers are "NO", then may proceed with Cephalosporin use.;  . Shrimp [Shellfish Allergy] Hives and Rash    Medications:   Prior to Admission medications   Medication Sig Start Date End Date Taking? Authorizing Provider  aspirin-acetaminophen-caffeine (EXCEDRIN MIGRAINE) [phone] MG tablet Take 2 tablets by mouth every 6 (six) hours as needed for headache or migraine.   Yes [provider]  ondansetron (ZOFRAN-ODT) 4 MG disintegrating tablet Take 1 tablet (4 mg total) by mouth every 8 (eight) hours as needed for nausea or vomiting. 01/12/18  Yes Vanessa Kick, MD  lamoTRIgine (LAMICTAL) 100 MG tablet Take 1 tablet (100 mg total) by mouth 2 (two) times daily. Patient not taking: Reported on 01/16/2018 01/22/16   Marcial Pacas, MD    Inpatient medications: . chlorhexidine gluconate (MEDLINE KIT)  15 mL Mouth Rinse BID  . Chlorhexidine Gluconate Cloth  6 each Topical Daily  . fentaNYL (SUBLIMAZE) injection  50 mcg Intravenous Once  .  folic acid  1 mg Intravenous Daily  . insulin aspart  1-3 Units Subcutaneous Q4H  . mouth rinse  15 mL Mouth Rinse 10 times per day  . pantoprazole sodium  40 mg Per Tube Daily  . sodium chloride flush  10-40 mL Intracatheter Q12H  . thiamine injection  100 mg Intravenous Daily    Discontinued Meds:   Medications Discontinued During This Encounter  Medication Reason  . ondansetron (ZOFRAN) 4 MG tablet Duplicate  . LORazepam (ATIVAN) injection 2-3 mg   . morphine 4 MG/ML injection  4 mg   . multivitamin with minerals tablet 1 tablet   . thiamine (VITAMIN B-1) tablet 174 mg   . folic acid (FOLVITE) tablet 1 mg   . LORazepam (ATIVAN) injection 2-3 mg   . metoprolol tartrate (LOPRESSOR) injection 5 mg   . dextrose 5 %-0.9 % sodium chloride infusion   . heparin injection 5,000 Units   . propofol (DIPRIVAN) 1000 MG/100ML infusion   . propofol (DIPRIVAN) 10 mg/mL bolus/IV push Completed Course  . propofol (DIPRIVAN) 1000 MG/100ML infusion   . lactated ringers infusion   . famotidine (PEPCID) IVPB 20 mg premix   . famotidine (PEPCID) IVPB 20 mg premix Duplicate  . sodium bicarbonate 150 mEq in dextrose 5 % 1,000 mL infusion     Social History:  reports that she has quit smoking. Her smoking use included cigarettes. She uses smokeless tobacco. She reports that she does not drink alcohol or use drugs.  Family History:   Family History  Problem Relation Age of Onset  . Diabetes Father   . Breast cancer Mother     Review of systems not obtained due to patient factors. Weight change: 3.2 kg (7 lb 0.9 oz)  Intake/Output Summary (Last 24 hours) at 01/18/2018 1644 Last data filed at 01/18/2018 1416 Gross per 24 hour  Intake 2641.98 ml  Output 870 ml  Net 1771.98 ml   BP (!) 97/57   Pulse (!) 119   Temp 99.2 F (37.3 C) (Oral)   Resp (!) 27   Ht _0  (1.575 m)   Wt 60.4 kg (133 lb 2.5 oz)   SpO2 99%   BMI 24.35 kg/m  Vitals:   01/18/18 1445 01/18/18 1500 01/18/18 1550 01/18/18 1553  BP:  (!) 89/65 (!) 97/57   Pulse: (!) 119 (!) 119    Resp: (!) 9 (!) 9 (!) 27   Temp:    99.2 F (37.3 C)  TempSrc:    Oral  SpO2: 99% 98% 99%   Weight:      Height:         General appearance: Intubated and sedated Head: Normocephalic, without obvious abnormality, atraumatic Resp: clear to auscultation bilaterally Cardio: no rub and tachycardic GI: distended, hypoactive bowel sounds, tense Extremities: extremities normal, atraumatic, no cyanosis or  edema  Labs: Basic Metabolic Panel: Recent Labs  Lab 01/16/18 1728 01/17/18 0041 01/17/18 0314 01/17/18 0814 01/17/18 0833 01/17/18 2048 01/18/18 0351 01/18/18 0837  NA 130* 131*  --  131* 135 134* 137 139  K 4.4 4.9  --  5.2* 3.8 3.7 3.4* 3.4*  CL 100* 100*  --  101 112* 104 104 103  CO2 11* 9*  --  14* 10* 18* 24 26  GLUCOSE 82 158*  --  206* 162* 213* 154* 162*  BUN 26* 26*  --  27* 23* 28* 29* 28*  CREATININE 2.01* 1.75*  --  1.60* 1.15* 1.18* 1.02* 0.96  ALBUMIN  2.7* 3.7 2.7* 2.8* 2.2* 2.2*  --   --   CALCIUM 7.6* 8.3*  --  7.4* 5.9* 6.1* 6.1* 6.1*  PHOS  --   --   --   --   --   --  2.0*  --    Liver Function Tests: Recent Labs  Lab 01/17/18 0637 01/17/18 0833 01/17/18 2048  AST 111* 82* 74*  ALT _0 ALKPHOS 33* 22* 20*  BILITOT 1.0 0.8 0.7  PROT 5.8* 4.5* 4.8*  ALBUMIN 2.8* 2.2* 2.2*   Recent Labs  Lab 01/16/18 1728 01/17/18 0314 01/18/18 0837  LIPASE 2,211* 1,153* 363*  AMYLASE  --   --  307*   No results for input(s): AMMONIA in the last 168 hours. CBC: Recent Labs  Lab 01/16/18 1728 01/17/18 0314 01/17/18 0819 01/18/18 0351  WBC 15.8* 11.6* 8.7 4.0  NEUTROABS 13.5*  --  8.0*  --   HGB 12.2 14.3 11.2* 9.4*  HCT 34.9* 40.6 31.9* 26.1*  MCV 107.4* 106.6* 107.8* 104.4*  PLT 79* 72* 57* 64*   PT/INR: _1 (inr:5) Cardiac Enzymes: ) Recent Labs  Lab 01/17/18 1519 01/17/18 2048 01/18/18 0351  TROPONINI <0.03 0.22* 0.25*   CBG: Recent Labs  Lab 01/17/18 2318 01/18/18 0354 01/18/18 0753 01/18/18 1204 01/18/18 1536  GLUCAP 187* 154* 138* 121* 100*    Iron Studies: No results for input(s): IRON, TIBC, TRANSFERRIN, FERRITIN in the last 168 hours.  Xrays/Other Studies: Ct Abdomen Pelvis Wo Contrast  Result Date: 01/16/2018 CLINICAL DATA:  Abdominal pain radiating to back EXAM: CT ABDOMEN AND PELVIS WITHOUT CONTRAST TECHNIQUE: Multidetector CT imaging of the abdomen and pelvis was performed following the standard protocol  without oral or IV contrast. COMPARISON:  None. FINDINGS: Lower chest: Lung bases are clear. Hepatobiliary: There is a patent steatosis. No focal liver lesions are appreciable. Gallbladder wall is not appreciably thickened. There is suspected sludge in gallbladder. There is no biliary duct dilatation. Pancreas: There is mild generalized prominence of the pancreas with what is felt to be a degree edema throughout the pancreas. There is surrounding peripancreatic fluid. Fluid tracks on the right laterally into the right lateral conal fascia region. There is loculated ascites throughout the anterior abdominal region with mesenteric thickening throughout the anterolateral abdominal wall regions extending inferior to the level of the pelvis. There is moderate ascites in the dependent portion of the pelvis as well which may partially loculated. Spleen: No splenic lesions are evident. Adrenals/Urinary Tract: Adrenals bilaterally appear unremarkable. Kidneys bilaterally show no evident mass or hydronephrosis on either side. There is no renal or ureteral calculus on either side. Urinary bladder is midline with wall thickness within normal limits. Stomach/Bowel: There is mild wall thickening in multiple loops of small bowel, likely due to enteritis secondary to the pancreatitis. Similar changes are noted in the ascending and descending colon. There is no evident bowel obstruction. No free air or portal venous air. Vascular/Lymphatic: There is no abdominal aortic aneurysm. No vascular lesions are evident. There is no appreciable adenopathy in the abdomen or pelvis. Reproductive: Uterus is anteverted.  No evident pelvic mass. Other: Appendix appears normal. There is no evident abscess in the abdomen or pelvis. Musculoskeletal: No blastic or lytic bone lesions. No intramuscular or abdominal wall lesions are evident. IMPRESSION: 1. Findings indicative of acute pancreatitis. Pancreas appears edematous with peripancreatic fluid.  There is fluid tracking throughout the anterolateral abdominal regions with mesenteric thickening in these areas felt to be due to pancreatitis. There  is localized partially loculated ascites in the dependent portion of the pelvis. 2. Wall thickening involving multiple loops of small and large bowel, likely due to enterocolitis secondary to inflammation from the peripancreatic fluid/pancreatitis. No bowel obstruction. 3. No frank abscess in the abdomen or pelvis. Appendix appears normal. 4.  No renal or ureteral calculus.  No hydronephrosis. 5. Question sludge in gallbladder. Gallbladder wall does not appear appreciably thickened. 6.  Hepatic steatosis. Electronically Signed   By: Lowella Grip III M.D.   On: 01/16/2018 18:08   US Abdomen Complete  Result Date: 01/18/2018 CLINICAL DATA:  Pancreatitis.  Abdominal distension EXAM: ABDOMEN ULTRASOUND COMPLETE COMPARISON:  CT abdomen and pelvis Jan 16, 2018 FINDINGS: Gallbladder: No gallstones are appreciable. There is mild sludge in gallbladder. Gallbladder wall does not appear thickened. There is slight pericholecystic fluid, however. No sonographic Murphy sign noted by sonographer. Common bile duct: Diameter: 6 mm. No intrahepatic, common hepatic, or common bile duct dilatation. Liver: No focal lesion identified. Liver echogenicity is increased. Portal vein is patent on color Doppler imaging with normal direction of blood flow towards the liver. IVC: No abnormality visualized. Pancreas: Pancreas is enlarged and edematous. The echotexture of the pancreas is inhomogeneous. There is slight peripancreatic fluid. No evident pancreatic mass or pseudocyst is seen by ultrasound. Spleen: Size and appearance within normal limits. Right Kidney: Length: 11.7 cm. Echogenicity within normal limits. No mass or hydronephrosis visualized. Left Kidney: Length: 11.5 cm. Echogenicity within normal limits. No mass or hydronephrosis visualized. Abdominal aorta: No aneurysm  visualized. Other findings: There is a mild degree of ascites. IMPRESSION: 1. Changes of acute pancreatitis. No well-defined pancreatic mass or pseudocyst. 2. Slight pericholecystic fluid. Gallbladder wall is not appear appreciably thickened. There is mild sludge in gallbladder and gallstones. Possibility of a degree of acalculus cholecystitis is of concern. This finding may warrant nuclear medicine hepatobiliary imaging study to assess for cystic duct patency. 3. Increase in liver echogenicity, a finding most likely indicative of hepatic steatosis. While no focal liver lesions are evident, it must be cautioned that the sensitivity of ultrasound for detection of focal liver lesions is diminished in this circumstance. 4.  Slight ascites. Electronically Signed   By: Lowella Grip III M.D.   On: 01/18/2018 11:03   Dg Chest Port 1 View  Result Date: 01/18/2018 CLINICAL DATA:  Followup for respiratory failure. EXAM: PORTABLE CHEST 1 VIEW COMPARISON:  01/17/2018 and older exams. FINDINGS: Left pleural effusion which has increased in size from the previous day's study. There is persistent left lung base opacity consistent with atelectasis or possibly pneumonia. Right lung remains clear. No right pleural effusion.  No pneumothorax. Endotracheal tube, left subclavian central venous line and nasal/orogastric tube are stable and well positioned. IMPRESSION: 1. Mild interval worsening of lung aeration on the left with an increase in left pleural fluid. There is persistent left lung base opacity consistent with pneumonia or atelectasis. 2. No other change. Right lung remains clear. Support apparatus is stable and well positioned. Electronically Signed   By: Lajean Manes M.D.   On: 01/18/2018 08:55   Dg Chest Portable 1 View  Result Date: 01/17/2018 CLINICAL DATA:  Post intubation EXAM: PORTABLE CHEST 1 VIEW COMPARISON:  05/02/2015 FINDINGS: An endotracheal tube has been placed with tip measuring 1.8 cm above the  carina. Enteric tube tip is off the field of view but below the left hemidiaphragm. Shallow inspiration. Heart size and pulmonary vascularity are normal. Lungs are clear and expanded. Mediastinal contours appear  intact. Thoracic scoliosis convex towards the right. IMPRESSION: Appliances appear in satisfactory position. No active pulmonary disease. Electronically Signed   By: Lucienne Capers M.D.   On: 01/17/2018 06:25   Dg Chest Port 1v Same Day  Result Date: 01/17/2018 CLINICAL DATA:  Displacement of central venous catheter. EXAM: PORTABLE CHEST 1 VIEW COMPARISON:  01/17/2018 at 6:08 a.m. FINDINGS: Since prior exam, a left subclavian central venous line has been placed. The tip projects at the caval atrial junction. No pneumothorax. The endotracheal tube and the nasal/orogastric tube are stable and well positioned. There is new left lung base opacity mostly silhouetting the left hemidiaphragm. This may reflect atelectasis or a rapidly evolving infection. Atelectasis is favored. IMPRESSION: 1. New left subclavian central venous line is well positioned, tip at the caval atrial junction. No pneumothorax. 2. New left lung base opacity, most likely atelectasis. Consider pneumonia if there are consistent clinical findings. Electronically Signed   By: Lajean Manes M.D.   On: 01/17/2018 12:27     Assessment/Plan: 1.  AKI, oliguric- with improved creatinine levels and UOP following IVF's.  No indication for CVVHD at this time and will continue to follow. 2. Acute pancreatitis- due to alcohol abuse.   She has evidence of an edematous pancreas with peripancreatic fluid and ascites.  Plan per primary 3. VDRF intubated per PCCM 4. Sepsis- presumably due to combination of acute pancreatitis and aspiration pneumonia.  Currently on levophed gtt per PCCM. 5. Seizure disorder- likely related to Etoh and acute illness 6. AMS- acute encephalopathy combo of alcohol withdrawal and seizures 7. Thrombocytopenia- follow,  no active bleeding.  8. Metabolic acidosis due to AKI now resolved after bicarb 9. Hyponatremia due to volume depletion and AKI, improved.  10. Moderate protein malnutrition- albumin 2.2   Donetta Potts 01/18/2018, 4:44 PM

## 2018-01-18 NOTE — Progress Notes (Signed)
Visited with  Mother and sister of this patient outside the patient's room.  They are appropriately concerned and are hoping for the patient's recovery.  Mother is appreciative of Chaplain support and the support of everyone on the unit stating everyone has been real nice.  Will continue to follow.    01/18/18 1240  Clinical Encounter Type  Visited With Family  Visit Type Initial;Spiritual support  Spiritual Encounters  Spiritual Needs Grief support;Emotional

## 2018-01-18 NOTE — Progress Notes (Signed)
Calcium 6.1. Md aware of low ca from labs earlier.

## 2018-01-18 NOTE — Progress Notes (Signed)
Patient transported to CT and back to 2H04. No complications noted.

## 2018-01-19 ENCOUNTER — Inpatient Hospital Stay (HOSPITAL_COMMUNITY): Payer: Managed Care, Other (non HMO)

## 2018-01-19 DIAGNOSIS — J9601 Acute respiratory failure with hypoxia: Secondary | ICD-10-CM

## 2018-01-19 DIAGNOSIS — R6521 Severe sepsis with septic shock: Secondary | ICD-10-CM

## 2018-01-19 DIAGNOSIS — G9340 Encephalopathy, unspecified: Secondary | ICD-10-CM

## 2018-01-19 LAB — BASIC METABOLIC PANEL
ANION GAP: 10 (ref 5–15)
BUN: 27 mg/dL — ABNORMAL HIGH (ref 6–20)
CHLORIDE: 105 mmol/L (ref 101–111)
CO2: 23 mmol/L (ref 22–32)
CREATININE: 0.86 mg/dL (ref 0.44–1.00)
Calcium: 7 mg/dL — ABNORMAL LOW (ref 8.9–10.3)
GFR calc non Af Amer: >60 mL/min (ref >60)
Glucose, Bld: 116 mg/dL — ABNORMAL HIGH (ref 65–99)
Potassium: 3.6 mmol/L (ref 3.5–5.1)
SODIUM: 138 mmol/L (ref 135–145)

## 2018-01-19 LAB — POCT I-STAT 3, ART BLOOD GAS (G3+)
ACID-BASE DEFICIT: 4 mmol/L — AB (ref 0.0–2.0)
Acid-base deficit: 4 mmol/L — ABNORMAL HIGH (ref 0.0–2.0)
BICARBONATE: 21.8 mmol/L (ref 20.0–28.0)
Bicarbonate: 22.1 mmol/L (ref 20.0–28.0)
O2 SAT: 97 %
O2 Saturation: 93 %
PCO2 ART: 45 mmHg (ref 32.0–48.0)
PCO2 ART: 39.7 mmHg (ref 32.0–48.0)
PH ART: 7.347 — AB (ref 7.350–7.450)
PO2 ART: 75 mmHg — AB (ref 83.0–108.0)
PO2 ART: 90 mmHg (ref 83.0–108.0)
Patient temperature: 98.6
Patient temperature: 98.6
TCO2: 23 mmol/L (ref 22–32)
TCO2: 23 mmol/L (ref 22–32)
pH, Arterial: 7.299 — ABNORMAL LOW (ref 7.350–7.450)

## 2018-01-19 LAB — CBC
HCT: 26.7 % — ABNORMAL LOW (ref 36.0–46.0)
HEMOGLOBIN: 9.3 g/dL — AB (ref 12.0–15.0)
MCH: 37.7 pg — AB (ref 26.0–34.0)
MCHC: 34.8 g/dL (ref 30.0–36.0)
MCV: 108.1 fL — AB (ref 78.0–100.0)
Platelets: 74 10*3/uL — ABNORMAL LOW (ref 150–400)
RBC: 2.47 MIL/uL — AB (ref 3.87–5.11)
RDW: 12.3 % (ref 11.5–15.5)
WBC: 6.8 10*3/uL (ref 4.0–10.5)

## 2018-01-19 LAB — GLUCOSE, CAPILLARY
GLUCOSE-CAPILLARY: 106 mg/dL — AB (ref 65–99)
GLUCOSE-CAPILLARY: 111 mg/dL — AB (ref 65–99)
GLUCOSE-CAPILLARY: 113 mg/dL — AB (ref 65–99)
GLUCOSE-CAPILLARY: 120 mg/dL — AB (ref 65–99)
Glucose-Capillary: 123 mg/dL — ABNORMAL HIGH (ref 65–99)
Glucose-Capillary: 110 mg/dL — ABNORMAL HIGH (ref 65–99)

## 2018-01-19 LAB — LIPASE, BLOOD: Lipase: 135 U/L — ABNORMAL HIGH (ref 11–51)

## 2018-01-19 LAB — COMPREHENSIVE METABOLIC PANEL
ALT: 21 U/L (ref 14–54)
ANION GAP: 9 (ref 5–15)
AST: 50 U/L — ABNORMAL HIGH (ref 15–41)
Albumin: 1.8 g/dL — ABNORMAL LOW (ref 3.5–5.0)
Alkaline Phosphatase: 28 U/L — ABNORMAL LOW (ref 38–126)
BUN: 28 mg/dL — ABNORMAL HIGH (ref 6–20)
CHLORIDE: 105 mmol/L (ref 101–111)
CO2: 24 mmol/L (ref 22–32)
Calcium: 6.7 mg/dL — ABNORMAL LOW (ref 8.9–10.3)
Creatinine, Ser: 0.98 mg/dL (ref 0.44–1.00)
GFR calc non Af Amer: >60 mL/min (ref >60)
Glucose, Bld: 130 mg/dL — ABNORMAL HIGH (ref 65–99)
Potassium: 4.1 mmol/L (ref 3.5–5.1)
SODIUM: 138 mmol/L (ref 135–145)
Total Bilirubin: 0.9 mg/dL (ref 0.3–1.2)
Total Protein: 4.7 g/dL — ABNORMAL LOW (ref 6.5–8.1)

## 2018-01-19 LAB — AMYLASE: Amylase: 158 U/L — ABNORMAL HIGH (ref 28–100)

## 2018-01-19 LAB — URINE CULTURE: Culture: NO GROWTH

## 2018-01-19 LAB — MAGNESIUM
MAGNESIUM: 2.1 mg/dL (ref 1.7–2.4)
Magnesium: 2 mg/dL (ref 1.7–2.4)

## 2018-01-19 LAB — PHOSPHORUS
PHOSPHORUS: 2.6 mg/dL (ref 2.5–4.6)
Phosphorus: 3.3 mg/dL (ref 2.5–4.6)

## 2018-01-19 LAB — PROCALCITONIN: PROCALCITONIN: 2.75 ng/mL

## 2018-01-19 LAB — LACTIC ACID, PLASMA: Lactic Acid, Venous: 1.4 mmol/L (ref 0.5–1.9)

## 2018-01-19 MED ORDER — PRO-STAT SUGAR FREE PO LIQD: 30.0000 mL | Freq: Two times a day (BID) | ORAL | Status: DC

## 2018-01-19 MED ORDER — CHLORHEXIDINE GLUCONATE CLOTH 2 % EX PADS
6.0000 | MEDICATED_PAD | Freq: Every day | CUTANEOUS | Status: DC
  Administered 2018-01-20 – 2018-01-25 (×7): 6 via TOPICAL

## 2018-01-19 MED ORDER — VITAL HIGH PROTEIN PO LIQD: 1000.0000 mL | ORAL | Status: DC

## 2018-01-19 MED ORDER — POTASSIUM CHLORIDE 10 MEQ/50ML IV SOLN
10.0000 meq | INTRAVENOUS | Status: AC
  Administered 2018-01-19 (×3): 10 meq via INTRAVENOUS
  Filled 2018-01-19 (×3): qty 50

## 2018-01-19 MED ORDER — VITAL 1.5 CAL PO LIQD
1000.0000 mL | ORAL | Status: DC
  Filled 2018-01-19: qty 1000

## 2018-01-19 MED ORDER — SODIUM CHLORIDE 0.9 % IV SOLN
1.0000 g | Freq: Once | INTRAVENOUS | Status: AC
  Administered 2018-01-19: 1 g via INTRAVENOUS
  Filled 2018-01-19: qty 10

## 2018-01-19 NOTE — Progress Notes (Signed)
Pharmacy Antibiotic Note  Donna Short is a 27 y.o. female admitted on 01/16/2018 with sepsis.  Pharmacy has been consulted for Zosyn dosing. Patient is afebrile, renal function is stable, and WBC and lactic acid are WNL and have been trending down.  Plan: - Continue Zosyn 3.375 IV q8h - Monitor clinical course and LOT  Height:  (157.5 cm) Weight: 137 lb 2 oz (62.2 kg) IBW/kg (Calculated) : 50.1  Temp (24hrs), Avg:98.9 F (37.2 C), Min:98.2 F (36.8 C), Max:99.5 F (37.5 C)  Recent Labs  Lab 01/16/18 1728  01/17/18 0314  01/17/18 0819  01/17/18 1239 01/17/18 1826  01/18/18 0351 01/18/18 0830 01/18/18 0837 01/18/18 1934 01/18/18 2248 01/19/18 0329  WBC 15.8*  --  11.6*  --  8.7  --   --   --   --  4.0  --   --   --   --  6.8  CREATININE 2.01*   < >  --    < >  --    < >  --   --    < > 1.02*  --  0.96 1.00 0.98 0.98  LATICACIDVEN  --    < >  --    < > 4.3*  --  4.3* 3.5*  --   --  2.3*  --  1.8  --  1.4   < > = values in this interval not displayed.    Estimated Creatinine Clearance: 73.4 mL/min (by C-G formula based on SCr of 0.98 mg/dL).    Allergies  Allergen Reactions  . Benadryl [Diphenhydramine Hcl (Sleep)] Rash  . Latex Rash  . Penicillins Itching and Rash    Has patient had a PCN reaction causing immediate rash, facial/tongue/throat swelling, SOB or lightheadedness with hypotension: Yes Has patient had a PCN reaction causing severe rash involving mucus membranes or skin necrosis: No Has patient had a PCN reaction that required hospitalization: Yes Has patient had a PCN reaction occurring within the last 10 years: No If all of the above answers are "NO", then may proceed with Cephalosporin use.;  . Shrimp [Shellfish Allergy] Hives and Rash    Antimicrobials this admission: Zosyn 3.375 IV q8h 5/25 >>   Dose adjustments this admission: None  Microbiology results: 5/25 BCx: NGTD 5/26 BCx: NGTD 5/27 UCx: Sent  5/26 MRSA PCR: Neg  Thank you for  allowing pharmacy to be a part of this patient's care.  Marilu Favre, PharmD Student 01/19/2018 8:48 AM

## 2018-01-19 NOTE — Consult Note (Addendum)
.. ..  Name: Donna Short MRN: [id number] DOB: [birth year]    ADMISSION DATE:  01/16/2018 CONSULTATION DATE:  01/17/2018  REFERRING MD :  TRH   CHIEF COMPLAINT:  Seizures, Altered mental status  BRIEF PATIENT DESCRIPTION: 27 yr old female with PMHx HTN, Asthma, Seizures and known ETOH abuse presents with abdominal pain, nausea, vomiting . CTA/P shows pancreatitis. HTN and Tachycardic last ETOH was on 5/21=5/22 now s/p seizure obtunded. Intubated. PCCM consulted for mgmt  SIGNIFICANT EVENTS  Acute pancreatitis HTN and Tachy- poss. Acute ETOH withdrawal Seizure  Intubated  STUDIES:  CTA/P:  IMPRESSION: 1. Findings indicative of acute pancreatitis. Pancreas appears edematous with peripancreatic fluid. There is fluid tracking throughout the anterolateral abdominal regions with mesenteric thickening in these areas felt to be due to pancreatitis. There is localized partially loculated ascites in the dependent portion of the pelvis. 2. Wall thickening involving multiple loops of small and large bowel, likely due to enterocolitis secondary to inflammation from the peripancreatic fluid/pancreatitis. No bowel obstruction. 3. No frank abscess in the abdomen or pelvis. Appendix appears normal. 4.  No renal or ureteral calculus.  No hydronephrosis. 5. Question sludge in gallbladder. Gallbladder wall does not appear appreciably thickened. 6.  Hepatic steatosis.  CXR 5/26>>  New left subclavian central venous line is well positioned, tip at the caval atrial junction. No pneumothorax. New left lung base opacity, most likely atelectasis.  Consider pneumonia if there are consistent clinical findings.  Brief History:  (HPI obtained from the accounts of other providers, EMR, patient prior to seizure) 27 yr old fmeale with PMHx ETOH, asthma, HTN Seizures and a h/o binge drinking ( last ETOH use 5/21) Pt presented to the [facility] on 01/16/18 she had initial complaints on nausea, vomiting and abdominal  pain. Per the Triage note her symptoms began 3 days ago and were accompanied with frequent urination but she denied fevers. She was nauseous on 5/21 and was seen and given antiemetics she was unable to tolerate PO food or drink despite the antiemetic therapy. Her pain became much worse around 11PM on 5/24 and that is when the vomiting started.  She received empiric antibiotics, labs were sent ad pt was started on IVF. Only one set of cultures obtained before antibiotics. She was admitted to the hospitalist.   Over night 5/25-5/26 noted to became Hypertensive, Tachycardic, and diaphoretic. Started on CIWA protocol. At 0500 patient became less responsive with snorous respirations with noted seizure requiring intubation and transfer to ICU.   SUBJECTIVE: No acute events overnight. On precidex gtt. Grimaces to sternal rub  VITAL SIGNS: Temp:  [98.2 F (36.8 C)-99.5 F (37.5 C)] 99.1 F (37.3 C) (05/28 0731) Pulse Rate:  [108-127] 112 (05/28 0745) Resp:  [4-29] 29 (05/28 0734) BP: (85-117)/(54-77) 106/69 (05/28 0734) SpO2:  [89 %-100 %] 100 % (05/28 0745) Arterial Line BP: (88-146)/(50-106) 102/64 (05/28 0745) FiO2 (%):  [40 %] 40 % (05/28 0734) Weight:  [137 lb 2 oz (62.2 kg)] 137 lb 2 oz (62.2 kg) (05/28 0359)  PHYSICAL EXAMINATION: General: Sedated, intubated, on PRVC Neuro:Sedated, Pupils are pinpoint and sluggish to react bilaterally. Grimaces to sternal rub HEENT:  ETT in oropharynx, NCAT, facial edema Cardiovascular:  Tachycardic, S1, S2, RRR, No MRG. No edema Lungs: Bilateral chest excursion,  Coarse breath sounds bilaterally, rhonchi throughout Abdomen:  Distended, slightly firm, BS are not tympanic Musculoskeletal:  No gross deformities appreciated no lower ext edema Skin:  Warm dry and intact  Recent Labs  Lab 01/18/18 1934  01/18/18 2248 01/19/18 0329  NA 137 135 138  K 3.5 3.7 4.1  CL 104 103 105  CO2 _0 BUN 28* 29* 28*  CREATININE 1.00 0.98 0.98  GLUCOSE 112*  117* 130*   Recent Labs  Lab 01/17/18 0819 01/18/18 0351 01/19/18 0329  HGB 11.2* 9.4* 9.3*  HCT 31.9* 26.1* 26.7*  WBC 8.7 4.0 6.8  PLT 57* 64* 74*   Ct Head Wo Contrast  Result Date: 01/18/2018 CLINICAL DATA:  Altered mental status. EXAM: CT HEAD WITHOUT CONTRAST TECHNIQUE: Contiguous axial images were obtained from the base of the skull through the vertex without intravenous contrast. COMPARISON:  CT scan of December 07, 2015. FINDINGS: Brain: No evidence of acute infarction, hemorrhage, hydrocephalus, extra-axial collection or mass lesion/mass effect. Vascular: No hyperdense vessel or unexpected calcification. Skull: Normal. Negative for fracture or focal lesion. Sinuses/Orbits: Probable mild sphenoid sinusitis. Other: None. IMPRESSION: Normal head CT. Electronically Signed   By: Marijo Conception, M.D.   On: 01/18/2018 18:35   US Abdomen Complete  Result Date: 01/18/2018 CLINICAL DATA:  Pancreatitis.  Abdominal distension EXAM: ABDOMEN ULTRASOUND COMPLETE COMPARISON:  CT abdomen and pelvis Jan 16, 2018 FINDINGS: Gallbladder: No gallstones are appreciable. There is mild sludge in gallbladder. Gallbladder wall does not appear thickened. There is slight pericholecystic fluid, however. No sonographic Murphy sign noted by sonographer. Common bile duct: Diameter: 6 mm. No intrahepatic, common hepatic, or common bile duct dilatation. Liver: No focal lesion identified. Liver echogenicity is increased. Portal vein is patent on color Doppler imaging with normal direction of blood flow towards the liver. IVC: No abnormality visualized. Pancreas: Pancreas is enlarged and edematous. The echotexture of the pancreas is inhomogeneous. There is slight peripancreatic fluid. No evident pancreatic mass or pseudocyst is seen by ultrasound. Spleen: Size and appearance within normal limits. Right Kidney: Length: 11.7 cm. Echogenicity within normal limits. No mass or hydronephrosis visualized. Left Kidney: Length: 11.5  cm. Echogenicity within normal limits. No mass or hydronephrosis visualized. Abdominal aorta: No aneurysm visualized. Other findings: There is a mild degree of ascites. IMPRESSION: 1. Changes of acute pancreatitis. No well-defined pancreatic mass or pseudocyst. 2. Slight pericholecystic fluid. Gallbladder wall is not appear appreciably thickened. There is mild sludge in gallbladder and gallstones. Possibility of a degree of acalculus cholecystitis is of concern. This finding may warrant nuclear medicine hepatobiliary imaging study to assess for cystic duct patency. 3. Increase in liver echogenicity, a finding most likely indicative of hepatic steatosis. While no focal liver lesions are evident, it must be cautioned that the sensitivity of ultrasound for detection of focal liver lesions is diminished in this circumstance. 4.  Slight ascites. Electronically Signed   By: Lowella Grip III M.D.   On: 01/18/2018 11:03   Dg Chest Port 1 View  Result Date: 01/18/2018 CLINICAL DATA:  Followup for respiratory failure. EXAM: PORTABLE CHEST 1 VIEW COMPARISON:  01/17/2018 and older exams. FINDINGS: Left pleural effusion which has increased in size from the previous day's study. There is persistent left lung base opacity consistent with atelectasis or possibly pneumonia. Right lung remains clear. No right pleural effusion.  No pneumothorax. Endotracheal tube, left subclavian central venous line and nasal/orogastric tube are stable and well positioned. IMPRESSION: 1. Mild interval worsening of lung aeration on the left with an increase in left pleural fluid. There is persistent left lung base opacity consistent with pneumonia or atelectasis. 2. No other change. Right lung remains clear. Support apparatus is stable and well positioned.  Electronically Signed   By: Lajean Manes M.D.   On: 01/18/2018 08:55   Dg Chest Port 1v Same Day  Result Date: 01/17/2018 CLINICAL DATA:  Displacement of central venous catheter. EXAM:  PORTABLE CHEST 1 VIEW COMPARISON:  01/17/2018 at 6:08 a.m. FINDINGS: Since prior exam, a left subclavian central venous line has been placed. The tip projects at the caval atrial junction. No pneumothorax. The endotracheal tube and the nasal/orogastric tube are stable and well positioned. There is new left lung base opacity mostly silhouetting the left hemidiaphragm. This may reflect atelectasis or a rapidly evolving infection. Atelectasis is favored. IMPRESSION: 1. New left subclavian central venous line is well positioned, tip at the caval atrial junction. No pneumothorax. 2. New left lung base opacity, most likely atelectasis. Consider pneumonia if there are consistent clinical findings. Electronically Signed   By: Lajean Manes M.D.   On: 01/17/2018 12:27   STUDIES:  CT A/P 5/25 > 1. Findings indicative of acute pancreatitis. Pancreas appears edematous with peripancreatic fluid. There is fluid tracking throughout the anterolateral abdominal regions with mesenteric thickening in these areas felt to be due to pancreatitis. There is localized partially loculated ascites in the dependent portion of the pelvis. Wall thickening involving multiple loops of small and large bowel, likely due to enterocolitis secondary to inflammation from the peripancreatic fluid/pancreatitis. No bowel obstruction. No frank abscess in the abdomen or pelvis. Appendix appears Normal. No renal or ureteral calculus. No hydronephrosis. Question sludge in gallbladder. Gallbladder wall does not appear appreciably thickened. Hepatic steatosis.  CULTURES: Blood 5/25 >>NGTD Blood 5/26>> NGTD Urine 5/27>>  ANTIBIOTICS: Zosyn 5/25 >>  SIGNIFICANT EVENTS: 5/25 > Presents to ED  5/26> Clinical worsening requiring CC consult 5/26> Pressors initiated 5/26> Surgical Consult 5/26> Nephro consult  LINES/TUBES: ETT 5/26 >> L B[place]berg CVC 5/26>> R Art Line >> 5/26 R EJ >5/25   ASSESSMENT / PLAN: NEURO: A: Acute  encephalopathy- metabolic and post ictal Alcohol Withdrawal with seizure CT head negative for acute intracranial process P: CIWA protocol  Neurochecks Q 1 Continue  precedex and fentanyl Avoid propofol- lipase markedly elevated RASS goal: -1 to -2 Wean Precedex to achieve RASS Folic Acid/Thimaine  Ativan PRN  Continue Keppra.   CARDIAC: A: H/o HTN. Now on Levo gtt. CVP 11 Tachycardia Non-invasive Flowtrack initiated 5/26 Elevated troponins>> ? Demand ischemia & renal failure Echo> EF 55%, MV trivial regurg, RV systolic fxn mildly reduced, TV Mild regurg P: Monitor QTC MAP goal > 65 ECHO as above  Continue to Trend Lactate>> downtrending A-line- for more accurate BP monitoring  Non-Invasive Flow track monitoring  PULMONARY: A: Acute hypoxic respiratory failure Intubated on mechanical ventilation UOP 0.64m/kg/hr. Net + 11 L P: TV 8cc/kg No weaning at present Maintain sats> 94% Continue Precedex/ Fentanyl Trend ABG/CXR Pulmonary Hygiene   ID: A: Acute Pancreatitis -  Leukocytosis and lactic acidosis>> resolved Blood cultures negative Procalcitation increase to 2.75 Plan: Continue ABX as above  Follow urine culture Trend  PCT Trend WBC, fever curve and lactate Deescalate antibiotics  Endocrine: A: No previous h/o insulin dependence P: Will be NPO If BG >1858mdl will start ISS  GI: A: Acute Pancreatitis secondary to ETOH Has sludge in galbladder but Alk phos and transaminases not markedly elevated Abdominal distension Hypoalbuminemia  Concern for abdominal compartment syndrome 5/27>> APP 78 ( Bladder pressure 17, MAP 95) P:  Keep NPO Monitor bladder pressures Q 4 Follow Abdominal Perfusion pressures Q 4 ( Goal is > 60 mmHg) Trend LFT's-stable Amylase  and Lipase down-trending Minimize IVF  Heme: A: Thrombocytopenia: stable Bleeding gums 5/27 with mouth care Anemia>>Hemodilutional. PT/INR within normal P: Trend CBC's Avoid medications  that may worsen Platelets Get Peripheral smear If Hgb<7 transfuse PRBCs Type & Cross done on admission DVT PPx-> SCDs  RENAL A: Anion Gap metabolic Acidosis- resolved Acute Kidney injury>> improving. UOP 0.58m/kg/hr Hypoalbuminemia Hypocalcemia>> corrects to 8.5 Hypokalemia: resolved Hypophos: resolved P: Conservative IVF Appreciate renal recs. No CVVHD now Avoid nephrotoxic medications and any contrast>> all imaging without contrast Check urine for ketones Monitor and replete electrolytes CMET daily  Monitor UO hourly  Lab Results  Component Value Date   CREATININE 0.98 01/19/2018   CREATININE 0.98 01/18/2018   CREATININE 1.00 01/18/2018  Replace electrolytes as needed.   Family member (mother) updated with plan at bedside  TWendee [email or web address] Pager [phone]/28/19  8:33 AM  01/19/2018, 7:51 AM  Attending Note:  27year old female with history of etoh abuse who presents to PCCM with DKA and alcoholic pancreatitis.  On exam, patient is arousable but comfortable.  Significant decrease in BS on the left on exam.  I reviewed CXR myself, very large left sided effusion vs infiltrate noted.  Discussed with resident.  Will continue full vent support for now.  Maintain on zosyn.  F/U on cultures.  Chest CT with contrast today, if large effusion then will consider thora.  Start TF per nutrition.  Monitor closely for abdominal pressures.  PCCM will continue to follow.  The patient is critically ill with multiple organ systems failure and requires high complexity decision making for assessment and support, frequent evaluation and titration of therapies, application of advanced monitoring technologies and extensive interpretation of multiple databases.   Critical Care Time devoted to patient care services described in this note is  33  Minutes. This time reflects time of care of this signee Dr WJennet Maduro This critical care time does not reflect procedure time, or teaching time  or supervisory time of PA/NP/Med student/Med Resident etc but could involve care discussion time.  WRush Farmer M.D. LDigestive Disease Center Of Central New York LLCPulmonary/Critical Care Medicine. Pager: [phone] After hours pager: [phone]

## 2018-01-19 NOTE — Progress Notes (Signed)
Abby, RN at [facility] made aware of CMP results.

## 2018-01-19 NOTE — Progress Notes (Signed)
Patient became extremely agitated trying to sit up thrashing in bed after obtaining IAP.  Medicated per [facility] for agitation and sedation drips increased as charted.Mother at bedside to attempt to calm patient.  Mother updated on POC and that patient will be going to CT for CT of chest. Verbalized understanding of POC.  Patient calming several minutes after PRN medications given and doses increased on sedation meds.  Rectal tube pulled out during episode.

## 2018-01-19 NOTE — Progress Notes (Signed)
[facility] Physician-Brief Progress Note Patient Name: Donna Short DOB: [birth year] MRN: [id number]   Date of Service  01/19/2018  HPI/Events of Note  K+ = 3.6, Ca++ = 7.0 and Creatinine = 0.86.  eICU Interventions  Will replace K+ and Ca++.     Intervention Category Major Interventions: Electrolyte abnormality - evaluation and management  Sommer,Steven Eugene 01/19/2018, 7:28 PM

## 2018-01-19 NOTE — Progress Notes (Signed)
[facility] Physician-Brief Progress Note Patient Name: Donna Short DOB: 1991-07-20 MRN: [id number]   Date of Service  01/19/2018  HPI/Events of Note  ABG on 40%/PRVC 26/TV 300/P 5 = 7.299/45.0/75.0/22.1.  eICU Interventions  Will order: 1. Increase PRVC rate to 29. 2. Repeat ABG at 6 AM.      Intervention Category Major Interventions: Respiratory failure - evaluation and management;Acid-Base disturbance - evaluation and management  Cathan Gearin Eugene 01/19/2018, 4:53 AM

## 2018-01-19 NOTE — Progress Notes (Signed)
Transported pt to and from CT Scan 3 on full support and 100% FIO2.  Pt stable throughout and returned to previous FIO2 of 40% upon return to room.

## 2018-01-19 NOTE — Progress Notes (Signed)
Initial Nutrition Assessment  DOCUMENTATION CODES:   Not applicable  INTERVENTION:   Vital 1.5 @ 35 ml/hr 30 ml Prostat BID  Provides: 1460 kcals, 87 grams protein, 638 ml free water. Meets 103% calorie needs and 100% of protein needs.   NUTRITION DIAGNOSIS:   Inadequate oral intake related to inability to eat as evidenced by NPO status.  GOAL:   Patient will meet greater than or equal to 90% of their needs   MONITOR:   Diet advancement, Vent status, Labs, Weight trends, TF tolerance, I & O's  REASON FOR ASSESSMENT:   Ventilator    ASSESSMENT:   Patient with PMH significant for seizure disorder and HTN. Presents this admission with complaints of significant abdominal pain with associated nausea/vomiting. Admitted for acute pancreatitis most likely alcohol related.    5/26- pt became less responsive with noted seizure, pt intubated   Pt requiring pressor support x1.  CT shows large left sided effusion. May need thoracentesis if no improvement.  Monitoring abdominal perfusion pressures and bladder pressures Q4.  Spoke with Critical care, okay to start TF.   No family at bedside to provide information.  Weight noted to increase 20 lb since admit on 4/26 (117 lb to 137 lb).  A limited Nutrition-Focused physical exam completed.   Patient is currently intubated on ventilator support MV: 8.6 L/min Temp (24hrs), Avg:99 F (37.2 C), Min:98.2 F (36.8 C), Max:99.5 F (37.5 C) BP: 96/77 MAP: 85  I/O: +11 L ml x since admit UOP: 855 ml x 24 hours   Medications reviewed and include: folic acid, thiamine, fentanyl Labs reviewed: amylase 158 (H) lipase 135 (H)  NUTRITION - FOCUSED PHYSICAL EXAM:    Most Recent Value  Orbital Region  No depletion  Upper Arm Region  No depletion  Thoracic and Lumbar Region  Unable to assess  Buccal Region  Unable to assess  Temple Region  No depletion  Clavicle Bone Region  No depletion  Clavicle and Acromion Bone Region  No  depletion  Scapular Bone Region  Unable to assess  Dorsal Hand  No depletion  Patellar Region  Unable to assess  Anterior Thigh Region  Unable to assess  Posterior Calf Region  Unable to assess  Edema (RD Assessment)  Unable to assess  Hair  Reviewed  Eyes  Unable to assess  Mouth  Unable to assess  Skin  Reviewed  Nails  Reviewed     Diet Order:   Diet Order           Diet NPO time specified  Diet effective now          EDUCATION NEEDS:   Not appropriate for education at this time  Skin:  Skin Assessment: Reviewed RN Assessment  Last BM:  5/28- flexiseal  Height:   Ht Readings from Last 1 Encounters:  01/17/18  (1.575 m)    Weight:   Wt Readings from Last 1 Encounters:  01/19/18 137 lb 2 oz (62.2 kg)    Ideal Body Weight:  50 kg  BMI:  Body mass index is 25.08 kg/m.  Estimated Nutritional Needs:   Kcal:  1415 kcal  Protein:  75-90 g  Fluid:  >1.4 L/day    Vanessa Kick RD, LDN Clinical Nutrition Pager # [phone]

## 2018-01-19 NOTE — Progress Notes (Signed)
Telephone call placed to Rutherford Guys, PA regarding tube feeding orders placed.  Patient is required to be supine and have intra-abdominal pressures measured q4 hours.  Patient with hypoactive bowel sounds as well.  Per Rahul, PA, hold tube feeds.

## 2018-01-19 NOTE — Progress Notes (Signed)
Rosey Bath, RN from 59M at bedside to assist and instruct on use of equipment/procedure to obtain intra-abdominal pressures to this RN and Lisette Abu, Charity fundraiser. This RN and Lisette Abu, RN were able to return demonstrate procedure correctly per her education.  Clinical skills folder given to this RN after demonstration.

## 2018-01-19 NOTE — Progress Notes (Signed)
Clear Sight monitor not reading, 27M ICU called by Devota Pace RN to ask for assistance with machine. 27M nurse unable to come to bedside, but suggested to call [phone] to help assistance. Devota Pace RN used operators manual at beside to reconnect patient to monitor and was able to get display working before tech support could call back.

## 2018-01-20 ENCOUNTER — Inpatient Hospital Stay (HOSPITAL_COMMUNITY): Payer: Managed Care, Other (non HMO)

## 2018-01-20 LAB — GLUCOSE, CAPILLARY
GLUCOSE-CAPILLARY: 95 mg/dL (ref 65–99)
GLUCOSE-CAPILLARY: 84 mg/dL (ref 65–99)
GLUCOSE-CAPILLARY: 89 mg/dL (ref 65–99)
GLUCOSE-CAPILLARY: 89 mg/dL (ref 65–99)
Glucose-Capillary: 102 mg/dL — ABNORMAL HIGH (ref 65–99)
Glucose-Capillary: 72 mg/dL (ref 65–99)

## 2018-01-20 LAB — COMPREHENSIVE METABOLIC PANEL
ALBUMIN: 1.5 g/dL — AB (ref 3.5–5.0)
ALK PHOS: 35 U/L — AB (ref 38–126)
ALT: 18 U/L (ref 14–54)
ANION GAP: 11 (ref 5–15)
AST: 33 U/L (ref 15–41)
BILIRUBIN TOTAL: 1 mg/dL (ref 0.3–1.2)
BUN: 24 mg/dL — ABNORMAL HIGH (ref 6–20)
CALCIUM: 7.8 mg/dL — AB (ref 8.9–10.3)
CO2: 23 mmol/L (ref 22–32)
Chloride: 105 mmol/L (ref 101–111)
Creatinine, Ser: 0.89 mg/dL (ref 0.44–1.00)
GFR calc non Af Amer: >60 mL/min (ref >60)
Glucose, Bld: 104 mg/dL — ABNORMAL HIGH (ref 65–99)
POTASSIUM: 4 mmol/L (ref 3.5–5.1)
Sodium: 139 mmol/L (ref 135–145)
TOTAL PROTEIN: 4.7 g/dL — AB (ref 6.5–8.1)

## 2018-01-20 LAB — MAGNESIUM: MAGNESIUM: 2.2 mg/dL (ref 1.7–2.4)

## 2018-01-20 LAB — CBC WITH DIFFERENTIAL/PLATELET
BASOS ABS: 0 10*3/uL (ref 0.0–0.1)
Basophils Relative: 0 %
EOS PCT: 1 %
Eosinophils Absolute: 0.1 10*3/uL (ref 0.0–0.7)
HEMATOCRIT: 25.8 % — AB (ref 36.0–46.0)
HEMOGLOBIN: 8.8 g/dL — AB (ref 12.0–15.0)
LYMPHS ABS: 1.1 10*3/uL (ref 0.7–4.0)
Lymphocytes Relative: 14 %
MCH: 37.6 pg — AB (ref 26.0–34.0)
MCHC: 34.1 g/dL (ref 30.0–36.0)
MCV: 110.3 fL — ABNORMAL HIGH (ref 78.0–100.0)
MONO ABS: 1.3 10*3/uL — AB (ref 0.1–1.0)
MONOS PCT: 16 %
Neutro Abs: 5.5 10*3/uL (ref 1.7–7.7)
Neutrophils Relative %: 69 %
Platelets: 70 10*3/uL — ABNORMAL LOW (ref 150–400)
RBC: 2.34 MIL/uL — AB (ref 3.87–5.11)
RDW: 12.5 % (ref 11.5–15.5)
WBC: 8 10*3/uL (ref 4.0–10.5)

## 2018-01-20 LAB — POCT I-STAT 3, ART BLOOD GAS (G3+)
ACID-BASE DEFICIT: 4 mmol/L — AB (ref 0.0–2.0)
Bicarbonate: 22.2 mmol/L (ref 20.0–28.0)
O2 SAT: 95 %
PCO2 ART: 42.8 mmHg (ref 32.0–48.0)
TCO2: 24 mmol/L (ref 22–32)
pH, Arterial: 7.322 — ABNORMAL LOW (ref 7.350–7.450)
pO2, Arterial: 84 mmHg (ref 83.0–108.0)

## 2018-01-20 MED ORDER — CLONIDINE HCL 0.1 MG/24HR TD PTWK
0.1000 mg | MEDICATED_PATCH | TRANSDERMAL | Status: DC
  Administered 2018-01-20 – 2018-01-27 (×2): 0.1 mg via TRANSDERMAL
  Filled 2018-01-20 (×2): qty 1

## 2018-01-20 MED ORDER — MIDAZOLAM HCL 2 MG/2ML IJ SOLN
1.0000 mg | INTRAMUSCULAR | Status: AC | PRN
  Administered 2018-01-20: 2 mg via INTRAVENOUS
  Filled 2018-01-20: qty 2

## 2018-01-20 MED ORDER — VITAL 1.5 CAL PO LIQD
1000.0000 mL | ORAL | Status: DC
Start: 2018-01-20 — End: 2018-01-22
  Administered 2018-01-20 – 2018-01-21 (×3): 1000 mL
  Filled 2018-01-20 (×4): qty 1000

## 2018-01-20 MED ORDER — LORAZEPAM 2 MG/ML IJ SOLN
1.0000 mg | INTRAMUSCULAR | Status: DC | PRN
  Administered 2018-01-20 – 2018-01-21 (×8): 2 mg via INTRAVENOUS
  Administered 2018-01-22: 1 mg via INTRAVENOUS
  Administered 2018-01-22 – 2018-01-23 (×3): 2 mg via INTRAVENOUS
  Filled 2018-01-20 (×13): qty 1

## 2018-01-20 NOTE — Progress Notes (Addendum)
.. ..  Name: Donna Short MRN: [id number] DOB: Aug 04, 1991    ADMISSION DATE:  01/16/2018 CONSULTATION DATE:  01/17/2018  REFERRING MD :  TRH   CHIEF COMPLAINT:  Seizures, Altered mental status  BRIEF PATIENT DESCRIPTION: 27 yr old female with PMHx HTN, Asthma, Seizures and known ETOH abuse presents with abdominal pain, nausea, vomiting . CTA/P shows pancreatitis. HTN and Tachycardic last ETOH was on 5/21=5/22 now s/p seizure obtunded. Intubated. PCCM consulted for mgmt  SIGNIFICANT EVENTS  Acute pancreatitis HTN and Tachy- poss. Acute ETOH withdrawal Seizure  Intubated  STUDIES:  CTA/P:  IMPRESSION: 1. Findings indicative of acute pancreatitis. Pancreas appears edematous with peripancreatic fluid. There is fluid tracking throughout the anterolateral abdominal regions with mesenteric thickening in these areas felt to be due to pancreatitis. There is localized partially loculated ascites in the dependent portion of the pelvis. 2. Wall thickening involving multiple loops of small and large bowel, likely due to enterocolitis secondary to inflammation from the peripancreatic fluid/pancreatitis. No bowel obstruction. 3. No frank abscess in the abdomen or pelvis. Appendix appears normal. 4.  No renal or ureteral calculus.  No hydronephrosis. 5. Question sludge in gallbladder. Gallbladder wall does not appear appreciably thickened. 6.  Hepatic steatosis.  CXR 5/26>>  New left subclavian central venous line is well positioned, tip at the caval atrial junction. No pneumothorax. New left lung base opacity, most likely atelectasis.  Consider pneumonia if there are consistent clinical findings.  Brief History:  (HPI obtained from the accounts of other providers, EMR, patient prior to seizure) 27 yr old fmeale with PMHx ETOH, asthma, HTN Seizures and a h/o binge drinking ( last ETOH use 5/21) Pt [facility] the [facility] on [facility]16[facility]18 she had initial complaints on nausea, vomiting and abdominal  pain. Per the Triage note her symptoms began 3 days ago and were accompanied with frequent urination but she denied fevers. She was nauseous on 5/21 and was seen and given antiemetics she was unable to tolerate PO food or drink despite the antiemetic therapy. Her pain became much worse around 11PM on 5/24 and that is when the vomiting started.  She received empiric antibiotics, labs were sent ad pt was started on IVF. Only one set of cultures obtained before antibiotics. She was admitted to the hospitalist.   Over night 5/25-5/26 noted to became Hypertensive, Tachycardic, and diaphoretic. Started on CIWA protocol. At 0500 patient became less responsive with snorous respirations with noted seizure requiring intubation and transfer to ICU.   SUBJECTIVE: Very agitated this AM on precedex and fentanyl during wake up assessment but not following any commands  VITAL SIGNS: Temp:  [98.2 F (36.8 C)-100.1 F (37.8 C)] 98.3 F (36.8 C) (05/29 0739) Pulse Rate:  [95-126] 99 (05/29 0739) Resp:  [13-30] 29 (05/29 0739) BP: (83-111)/(67-89) 95/80 (05/29 0739) SpO2:  [93 %-100 %] 100 % (05/29 0739) Arterial Line BP: (97-132)/(66-91) 104/73 (05/29 0615) FiO2 (%):  [40 %] 40 % (05/29 0739) Weight:  [142 lb 6.7 oz (64.6 kg)] 142 lb 6.7 oz (64.6 kg) (05/29 0328)  PHYSICAL EXAMINATION: General: Very agitated on PRVC, young woman Neuro: Agitated, moving all ext spontaneously but not to command HEENT:  [place]/AT, PERRL, EOM-I and MMM Cardiovascular:  Sinus tach, Nl S1/S2 and -M/R/G Lungs: Coarse BS diffusely Abdomen:  Distended, somewhat tense, hypoactive bowel sounds and diffusely tender Musculoskeletal:  -edema and -tenderness Skin:  Warm and intact  Recent Labs  Lab 01/19/18 0329 01/19/18 1659 01/20/18 0401  NA 138 138 139  K 4.1 3.6 4.0  CL 105 105 105  CO2 24 23 23   BUN 28* 27* 24*  CREATININE 0.98 0.86 0.89  GLUCOSE 130* 116* 104*   Recent Labs  Lab 01/18/18 0351 01/19/18 0329  01/20/18 0401  HGB 9.4* 9.3* 8.8*  HCT 26.1* 26.7* 25.8*  WBC 4.0 6.8 8.0  PLT 64* 74* 70*   Ct Head Wo Contrast  Result Date: 01/18/2018 CLINICAL DATA:  Altered mental status. EXAM: CT HEAD WITHOUT CONTRAST TECHNIQUE: Contiguous axial images were obtained from the base of the skull through the vertex without intravenous contrast. COMPARISON:  CT scan of December 07, 2015. FINDINGS: Brain: No evidence of acute infarction, hemorrhage, hydrocephalus, extra-axial collection or mass lesion/mass effect. Vascular: No hyperdense vessel or unexpected calcification. Skull: Normal. Negative for fracture or focal lesion. Sinuses/Orbits: Probable mild sphenoid sinusitis. Other: None. IMPRESSION: Normal head CT. Electronically Signed   By: Marijo Conception, M.D.   On: 01/18/2018 18:35   Ct Chest Wo Contrast  Result Date: 01/19/2018 CLINICAL DATA:  Follow-up pleural effusion EXAM: CT CHEST WITHOUT CONTRAST TECHNIQUE: Multidetector CT imaging of the chest was performed following the standard protocol without IV contrast. COMPARISON:  Chest x-ray from earlier in the same day FINDINGS: Cardiovascular: Somewhat limited due to the lack of IV contrast. No cardiac enlargement is noted. The thoracic aorta is within normal limits. Left subclavian central line is noted in the mid superior vena cava. Mediastinum/Nodes: Endotracheal tube and nasogastric catheter are noted. The thoracic inlet is within normal limits. No definitive hilar or mediastinal adenopathy is noted although again somewhat limited evaluation due to the lack of IV contrast. Lungs/Pleura: Bilateral lower lobe infiltrate is noted left greater than right with associated moderate effusions left greater than right. Additionally cough left upper lobe infiltrate is noted. These are similar to that seen on recent chest x-ray and again worsened when compared with previous exams. Upper Abdomen: Within normal limits. Musculoskeletal: Within normal limits. IMPRESSION: Patchy  multifocal infiltrates left greater than right with associated moderate effusions left greater than right. Electronically Signed   By: Inez Catalina M.D.   On: 01/19/2018 12:29   US Abdomen Complete  Result Date: 01/18/2018 CLINICAL DATA:  Pancreatitis.  Abdominal distension EXAM: ABDOMEN ULTRASOUND COMPLETE COMPARISON:  CT abdomen and pelvis Jan 16, 2018 FINDINGS: Gallbladder: No gallstones are appreciable. There is mild sludge in gallbladder. Gallbladder wall does not appear thickened. There is slight pericholecystic fluid, however. No sonographic Murphy sign noted by sonographer. Common bile duct: Diameter: 6 mm. No intrahepatic, common hepatic, or common bile duct dilatation. Liver: No focal lesion identified. Liver echogenicity is increased. Portal vein is patent on color Doppler imaging with normal direction of blood flow towards the liver. IVC: No abnormality visualized. Pancreas: Pancreas is enlarged and edematous. The echotexture of the pancreas is inhomogeneous. There is slight peripancreatic fluid. No evident pancreatic mass or pseudocyst is seen by ultrasound. Spleen: Size and appearance within normal limits. Right Kidney: Length: 11.7 cm. Echogenicity within normal limits. No mass or hydronephrosis visualized. Left Kidney: Length: 11.5 cm. Echogenicity within normal limits. No mass or hydronephrosis visualized. Abdominal aorta: No aneurysm visualized. Other findings: There is a mild degree of ascites. IMPRESSION: 1. Changes of acute pancreatitis. No well-defined pancreatic mass or pseudocyst. 2. Slight pericholecystic fluid. Gallbladder wall is not appear appreciably thickened. There is mild sludge in gallbladder and gallstones. Possibility of a degree of acalculus cholecystitis is of concern. This finding may warrant nuclear medicine hepatobiliary imaging study to  assess for cystic duct patency. 3. Increase in liver echogenicity, a finding most likely indicative of hepatic steatosis. While no focal  liver lesions are evident, it must be cautioned that the sensitivity of ultrasound for detection of focal liver lesions is diminished in this circumstance. 4.  Slight ascites. Electronically Signed   By: Lowella Grip III M.D.   On: 01/18/2018 11:03   Dg Chest Port 1 View  Result Date: 01/19/2018 CLINICAL DATA:  Check endotracheal tube placement EXAM: PORTABLE CHEST 1 VIEW COMPARISON:  01/18/2018 FINDINGS: Cardiac shadow is stable. Left-sided pleural effusion is again noted and slightly increased when compared with the prior exam. Increasing consolidation in the left base is noted as well. Endotracheal tube and nasogastric catheter are again seen in satisfactory position. Left subclavian central line is noted as well. The right lung remains clear. IMPRESSION: Increase in left basilar consolidation and left pleural effusion. Electronically Signed   By: Inez Catalina M.D.   On: 01/19/2018 08:51   Dg Chest Port 1 View  Result Date: 01/18/2018 CLINICAL DATA:  Followup for respiratory failure. EXAM: PORTABLE CHEST 1 VIEW COMPARISON:  01/17/2018 and older exams. FINDINGS: Left pleural effusion which has increased in size from the previous day's study. There is persistent left lung base opacity consistent with atelectasis or possibly pneumonia. Right lung remains clear. No right pleural effusion.  No pneumothorax. Endotracheal tube, left subclavian central venous line and nasal/orogastric tube are stable and well positioned. IMPRESSION: 1. Mild interval worsening of lung aeration on the left with an increase in left pleural fluid. There is persistent left lung base opacity consistent with pneumonia or atelectasis. 2. No other change. Right lung remains clear. Support apparatus is stable and well positioned. Electronically Signed   By: Lajean Manes M.D.   On: 01/18/2018 08:55   STUDIES:  CT A/P 5/25 > 1. Findings indicative of acute pancreatitis. Pancreas appears edematous with peripancreatic fluid. There is  fluid tracking throughout the anterolateral abdominal regions with mesenteric thickening in these areas felt to be due to pancreatitis. There is localized partially loculated ascites in the dependent portion of the pelvis. Wall thickening involving multiple loops of small and large bowel, likely due to enterocolitis secondary to inflammation from the peripancreatic fluid/pancreatitis. No bowel obstruction. No frank abscess in the abdomen or pelvis. Appendix appears Normal. No renal or ureteral calculus. No hydronephrosis. Question sludge in gallbladder. Gallbladder wall does not appear appreciably thickened. Hepatic steatosis.  CULTURES: Blood 5/25 >>NGTD Blood 5/26>> NGTD Urine 5/27>>  ANTIBIOTICS: Zosyn 5/25 >>  SIGNIFICANT EVENTS: 5/25 > Presents to ED  5/26> Clinical worsening requiring CC consult 5/26> Pressors initiated 5/26> Surgical Consult 5/26> Nephro consult  LINES/TUBES: ETT 5/26 >> L C-Road CVC 5/26>> R Art Line >> 5/26 R EJ >5/25   ASSESSMENT / PLAN: NEURO: A: Acute encephalopathy- metabolic and post ictal Alcohol Withdrawal with seizure CT head negative for acute intracranial process P: CIWA protocol  Neurochecks Q 1 Continue precedex and fentanyl Avoid propofol- lipase markedly elevated Add PRN versed PRN RASS goal: -1 to -2 Wean Precedex to achieve RASS Folic Acid/Thimaine  Ativan PRN  Continue Keppra.  Add catapres patch today as a precedex sparing agent  CARDIAC: A: H/o HTN. Now on Levo gtt. CVP 11 Tachycardia Non-invasive Flowtrack initiated 5/26 Elevated troponins>> ? Demand ischemia & renal failure Echo> EF 55%, MV trivial regurg, RV systolic fxn mildly reduced, TV Mild regurg P: Monitor QTC MAP goal > 65 ECHO as above  A-line- for more  accurate BP monitoring  D/C clear sight since patient is off pressors at this point and is hypertensive  PULMONARY: A: Acute hypoxic respiratory failure Intubated on mechanical ventilation UOP  0.34m/kg/hr. Net + 11 L P: TV 8cc/kg No weaning at present Maintain sats> 94% Trend ABG/CXR Pulmonary Hygiene  Hold off weaning given abdominal distension  ID: A: Acute Pancreatitis -  Leukocytosis and lactic acidosis>> resolved Blood cultures negative Procalcitation increase to 2.75 Plan: Continue ABX as above - Zosyn Follow urine culture Trend WBC, fever curve and lactate Deescalate antibiotics  Endocrine: A: No previous h/o insulin dependence P: CBG ISS  GI: A: Acute Pancreatitis secondary to ETOH Has sludge in galbladder but Alk phos and transaminases not markedly elevated Abdominal distension Hypoalbuminemia  Concern for abdominal compartment syndrome 5/27>> APP 78 ( Bladder pressure 17, MAP 95) P:  CT of the abdomen without contrast to be done today, ordered Monitor bladder pressures Q 4 Follow Abdominal Perfusion pressures Q 4 ( Goal is > 60 mmHg) 14 was the last one Trend LFT's-stable Amylase and Lipase down-trending TF were not started since patient has to be flat for abdominal pressure monitor, will change that to twice per shift check in order to be able to start TF KVO IVF  Heme: A: Thrombocytopenia: stable Bleeding gums 5/27 with mouth care Anemia>>Hemodilutional. PT/INR within normal P: Trend CBC's Avoid medications that may worsen Platelets Transfuse per ICU protocol <7 Type & Cross done on admission DVT PPx-> SCDs  RENAL A: Anion Gap metabolic Acidosis- resolved Acute Kidney injury>> improving. UOP 0.666mkg/hr Hypoalbuminemia Hypocalcemia>> corrects to 8.5 Hypokalemia: resolved Hypophos: resolved P: KVO IVF Appreciate renal recs. No CVVHD now given UOP Avoid nephrotoxic medications and any contrast>> all imaging without contrast Check urine for ketones Monitor and replete electrolytes CMET daily  Monitor UO hourly Replace electrolytes as needed.   Father updated bedside, mother unable to be here due to migraines and he will  update mother.  The patient is critically ill with multiple organ systems failure and requires high complexity decision making for assessment and support, frequent evaluation and titration of therapies, application of advanced monitoring technologies and extensive interpretation of multiple databases.   Critical Care Time devoted to patient care services described in this note is  36  Minutes. This time reflects time of care of this signee Dr WeJennet MaduroThis critical care time does not reflect procedure time, or teaching time or supervisory time of PA/NP/Med student/Med Resident etc but could involve care discussion time.  WeRush FarmerM.D. LeChildren[facility] At Missionulmonary/Critical Care Medicine. Pager: [phone] hours pager: [phone]

## 2018-01-20 NOTE — Progress Notes (Signed)
Making urine and renal fct stable with creatinine 0.89mg /dl. Nothing to add to the care at this time. Renal will sign off Lauris Poag, MD

## 2018-01-20 NOTE — Progress Notes (Signed)
Transported pt to CT scan on full support and 100% FIO2.  Pt stable throughtout transport and testing and returned to previous FIO2 of 100% upon return to room in 2H.

## 2018-01-20 NOTE — Progress Notes (Signed)
eLink Physician-Brief Progress Note Patient Name: ELISHEBA MCDONNELL DOB: 05-19-1991 MRN: [id number]   Date of Service  01/20/2018  HPI/Events of Note  No AM labs ordered.   eICU Interventions  Will order: 1. CBC, CMP, Mg++ level at 5 AM.      Intervention Category Major Interventions: Other:  Kentravious Lipford Dennard Nip 01/20/2018, 3:12 AM

## 2018-01-21 ENCOUNTER — Inpatient Hospital Stay (HOSPITAL_COMMUNITY): Payer: Managed Care, Other (non HMO)

## 2018-01-21 LAB — BASIC METABOLIC PANEL
ANION GAP: 10 (ref 5–15)
BUN: 20 mg/dL (ref 6–20)
CALCIUM: 8.5 mg/dL — AB (ref 8.9–10.3)
CO2: 24 mmol/L (ref 22–32)
Chloride: 108 mmol/L (ref 101–111)
Creatinine, Ser: 0.83 mg/dL (ref 0.44–1.00)
GFR calc Af Amer: >60 mL/min (ref >60)
Glucose, Bld: 136 mg/dL — ABNORMAL HIGH (ref 65–99)
Potassium: 3.8 mmol/L (ref 3.5–5.1)
SODIUM: 142 mmol/L (ref 135–145)

## 2018-01-21 LAB — URINALYSIS, ROUTINE W REFLEX MICROSCOPIC
Bilirubin Urine: NEGATIVE
Glucose, UA: NEGATIVE mg/dL
KETONES UR: NEGATIVE mg/dL
Leukocytes, UA: NEGATIVE
Nitrite: NEGATIVE
Protein, ur: NEGATIVE mg/dL
Specific Gravity, Urine: 1.006 (ref 1.005–1.030)
pH: 5 (ref 5.0–8.0)

## 2018-01-21 LAB — CBC
HCT: 26 % — ABNORMAL LOW (ref 36.0–46.0)
Hemoglobin: 8.8 g/dL — ABNORMAL LOW (ref 12.0–15.0)
MCH: 38.1 pg — AB (ref 26.0–34.0)
MCHC: 33.8 g/dL (ref 30.0–36.0)
MCV: 112.6 fL — AB (ref 78.0–100.0)
PLATELETS: 74 10*3/uL — AB (ref 150–400)
RBC: 2.31 MIL/uL — ABNORMAL LOW (ref 3.87–5.11)
RDW: 12.9 % (ref 11.5–15.5)
WBC: 18.8 10*3/uL — AB (ref 4.0–10.5)

## 2018-01-21 LAB — GLUCOSE, CAPILLARY
GLUCOSE-CAPILLARY: 129 mg/dL — AB (ref 65–99)
GLUCOSE-CAPILLARY: 131 mg/dL — AB (ref 65–99)
GLUCOSE-CAPILLARY: 87 mg/dL (ref 65–99)
Glucose-Capillary: 117 mg/dL — ABNORMAL HIGH (ref 65–99)
Glucose-Capillary: 163 mg/dL — ABNORMAL HIGH (ref 65–99)

## 2018-01-21 LAB — POCT I-STAT 3, ART BLOOD GAS (G3+)
Acid-base deficit: 3 mmol/L — ABNORMAL HIGH (ref 0.0–2.0)
Bicarbonate: 22.3 mmol/L (ref 20.0–28.0)
O2 SAT: 98 %
PCO2 ART: 43 mmHg (ref 32.0–48.0)
PO2 ART: 126 mmHg — AB (ref 83.0–108.0)
Patient temperature: 99.4
TCO2: 24 mmol/L (ref 22–32)
pH, Arterial: 7.325 — ABNORMAL LOW (ref 7.350–7.450)

## 2018-01-21 LAB — CULTURE, BLOOD (ROUTINE X 2)
CULTURE: NO GROWTH
SPECIAL REQUESTS: ADEQUATE

## 2018-01-21 LAB — HEPATIC FUNCTION PANEL
ALK PHOS: 63 U/L (ref 38–126)
ALT: 19 U/L (ref 14–54)
AST: 47 U/L — ABNORMAL HIGH (ref 15–41)
Albumin: 1.6 g/dL — ABNORMAL LOW (ref 3.5–5.0)
BILIRUBIN INDIRECT: 0.8 mg/dL (ref 0.3–0.9)
Bilirubin, Direct: 0.3 mg/dL (ref 0.1–0.5)
TOTAL PROTEIN: 5.3 g/dL — AB (ref 6.5–8.1)
Total Bilirubin: 1.1 mg/dL (ref 0.3–1.2)

## 2018-01-21 LAB — LIPASE, BLOOD: LIPASE: 51 U/L (ref 11–51)

## 2018-01-21 LAB — MAGNESIUM: MAGNESIUM: 1.9 mg/dL (ref 1.7–2.4)

## 2018-01-21 LAB — LACTIC ACID, PLASMA
Lactic Acid, Venous: 2.1 mmol/L (ref 0.5–1.9)
Lactic Acid, Venous: 2.3 mmol/L (ref 0.5–1.9)

## 2018-01-21 LAB — PHOSPHORUS: Phosphorus: 3.8 mg/dL (ref 2.5–4.6)

## 2018-01-21 LAB — PROCALCITONIN: PROCALCITONIN: 1.51 ng/mL

## 2018-01-21 MED ORDER — FUROSEMIDE 10 MG/ML IJ SOLN
40.0000 mg | Freq: Four times a day (QID) | INTRAMUSCULAR | Status: AC
  Administered 2018-01-21 (×3): 40 mg via INTRAVENOUS
  Filled 2018-01-21 (×3): qty 4

## 2018-01-21 MED ORDER — VITAMIN B-1 100 MG PO TABS
100.0000 mg | ORAL_TABLET | Freq: Every day | ORAL | Status: DC
  Administered 2018-01-22: 100 mg
  Filled 2018-01-21: qty 1

## 2018-01-21 MED ORDER — METOPROLOL TARTRATE 5 MG/5ML IV SOLN
5.0000 mg | Freq: Four times a day (QID) | INTRAVENOUS | Status: DC | PRN
  Administered 2018-01-21 – 2018-01-24 (×7): 5 mg via INTRAVENOUS
  Filled 2018-01-21 (×7): qty 5

## 2018-01-21 MED ORDER — ACETAMINOPHEN 160 MG/5ML PO SOLN
650.0000 mg | Freq: Four times a day (QID) | ORAL | Status: DC | PRN
  Administered 2018-01-21: 650 mg
  Filled 2018-01-21: qty 20.3

## 2018-01-21 MED ORDER — VITAMIN B-1 100 MG PO TABS: 100.0000 mg | ORAL_TABLET | Freq: Every day | ORAL | Status: DC

## 2018-01-21 MED ORDER — FOLIC ACID 1 MG PO TABS
1.0000 mg | ORAL_TABLET | Freq: Every day | ORAL | Status: DC
  Administered 2018-01-22: 1 mg
  Filled 2018-01-21: qty 1

## 2018-01-21 MED ORDER — PRO-STAT SUGAR FREE PO LIQD
30.0000 mL | Freq: Two times a day (BID) | ORAL | Status: DC
  Administered 2018-01-21 – 2018-01-22 (×3): 30 mL
  Filled 2018-01-21 (×3): qty 30

## 2018-01-21 NOTE — Plan of Care (Signed)
Patient status still remains guarded.  Tolerating the ventilator, increased secretions noted, elevated temp has decreased as charted.  Remains tachycardic with minimal response to metoprolol Iv.  Increased agitation with any stimulation.  Family (mother) requests to maintain quiet and decreased stimulation of other family members entering the room.  Monitoring continues.

## 2018-01-21 NOTE — Progress Notes (Signed)
CRITICAL VALUE ALERT  Critical Value:  Lactic Acid 2.1  Date & Time Notied:  01/21/2018 2.1  Provider Notified: Molli Knock MD   Orders Received/Actions taken: no orders placed.

## 2018-01-21 NOTE — Progress Notes (Signed)
.. ..  Name: Donna Short MRN: [id number] DOB: [birth year]    ADMISSION DATE:  01/16/2018 CONSULTATION DATE:  01/17/2018  REFERRING MD :  TRH   CHIEF COMPLAINT:  Seizures, Altered mental status  BRIEF PATIENT DESCRIPTION: 27 yr old female with PMHx HTN, Asthma, Seizures and known ETOH abuse presents with abdominal pain, nausea, vomiting . CTA/P shows pancreatitis. HTN and Tachycardic last ETOH was on 5/21=5/22 now s/p seizure obtunded. Intubated. PCCM consulted for mgmt  SIGNIFICANT EVENTS  Acute pancreatitis HTN and Tachy- poss. Acute ETOH withdrawal Seizure  Intubated  STUDIES:  CTA/P:  IMPRESSION: 1. Findings indicative of acute pancreatitis. Pancreas appears edematous with peripancreatic fluid. There is fluid tracking throughout the anterolateral abdominal regions with mesenteric thickening in these areas felt to be due to pancreatitis. There is localized partially loculated ascites in the dependent portion of the pelvis. 2. Wall thickening involving multiple loops of small and large bowel, likely due to enterocolitis secondary to inflammation from the peripancreatic fluid/pancreatitis. No bowel obstruction. 3. No frank abscess in the abdomen or pelvis. Appendix appears normal. 4.  No renal or ureteral calculus.  No hydronephrosis. 5. Question sludge in gallbladder. Gallbladder wall does not appear appreciably thickened. 6.  Hepatic steatosis.  CXR 5/26>>  New left subclavian central venous line is well positioned, tip at the caval atrial junction. No pneumothorax. New left lung base opacity, most likely atelectasis.  Consider pneumonia if there are consistent clinical findings.  Brief History:  (HPI obtained from the accounts of other providers, EMR, patient prior to seizure) 27 yr old fmeale with PMHx ETOH, asthma, HTN Seizures and a h/o binge drinking ( last ETOH use 5/21) Pt presented to the [facility] on 01/16/18 she had initial complaints on nausea, vomiting and abdominal  pain. Per the Triage note her symptoms began 3 days ago and were accompanied with frequent urination but she denied fevers. She was nauseous on 5/21 and was seen and given antiemetics she was unable to tolerate PO food or drink despite the antiemetic therapy. Her pain became much worse around 11PM on 5/24 and that is when the vomiting started.  She received empiric antibiotics, labs were sent ad pt was started on IVF. Only one set of cultures obtained before antibiotics. She was admitted to the hospitalist.   Over night 5/25-5/26 noted to became Hypertensive, Tachycardic, and diaphoretic. Started on CIWA protocol. At 0500 patient became less responsive with snorous respirations with noted seizure requiring intubation and transfer to ICU.   SUBJECTIVE: No events overnight, off pressors, weaning  VITAL SIGNS: Temp:  [98 F (36.7 C)-99.9 F (37.7 C)] 99.9 F (37.7 C) (05/30 0835) Pulse Rate:  [98-140] 140 (05/30 0718) Resp:  [17-35] 20 (05/30 0800) BP: (95-183)/(65-94) 116/73 (05/30 0800) SpO2:  [99 %-100 %] 100 % (05/30 0800) Arterial Line BP: (96-169)/(52-85) 129/62 (05/30 0800) FiO2 (%):  [40 %] 40 % (05/30 0800) Weight:  [147 lb 0.8 oz (66.7 kg)] 147 lb 0.8 oz (66.7 kg) (05/30 0335)  PHYSICAL EXAMINATION: General: Acutely ill appearing, NAD Neuro: Opens eyes to command, moving all ext.  Non-focal HEENT:  [place]/AT, PERRL, EOM-I and MMM Cardiovascular:  Sinus tachy, Nl S1/S2 and -M/R/G Lungs: Coarse BS diffusely Abdomen:  Distended, tense, non-tender and +BS Musculoskeletal:  1+ edema and -tenderness Skin:  Warm and intact  Recent Labs  Lab 01/19/18 1659 01/20/18 0401 01/21/18 0336  NA 138 139 142  K 3.6 4.0 3.8  CL 105 105 108  CO2 23 23 24  BUN 27* 24* 20  CREATININE 0.86 0.89 0.83  GLUCOSE 116* 104* 136*   Recent Labs  Lab 01/19/18 0329 01/20/18 0401 01/21/18 0336  HGB 9.3* 8.8* 8.8*  HCT 26.7* 25.8* 26.0*  WBC 6.8 8.0 18.8*  PLT 74* 70* 74*   Ct Abdomen Pelvis  Wo Contrast  Result Date: 01/20/2018 CLINICAL DATA:  History of pancreatitis EXAM: CT ABDOMEN AND PELVIS WITHOUT CONTRAST TECHNIQUE: Multidetector CT imaging of the abdomen and pelvis was performed following the standard protocol without IV contrast. COMPARISON:  01/16/2018 FINDINGS: Lower chest: Moderate to large bilateral pleural effusions are seen these are similar to a CT of the chest from the previous day but increased when compared with the prior CT examination. Bilateral lower lobe consolidation is noted as well. Hepatobiliary: Liver and gallbladder are within normal limits as visualized. Pancreas: The pancreas again demonstrates significant enlargement with peripancreatic inflammatory change in consistent with known pancreatitis. Lack of IV contrast somewhat limits evaluation of the gland itself. Spleen: Spleen is within normal limits. Adrenals/Urinary Tract: The adrenal glands and kidneys show no focal abnormality. No calculi or obstructive changes are seen. The bladder is decompressed by Foley catheter. Stomach/Bowel: Nasogastric catheter is noted within the stomach. Some thickening of the colonic wall is noted likely reactive to the free fluid within the abdomen. The appendix appears within normal limits. No obstructive changes are seen. Vascular/Lymphatic: No significant vascular findings are present. No enlarged abdominal or pelvic lymph nodes. Reproductive: Uterus and bilateral adnexa are unremarkable. Other: Free fluid is noted within the pelvis and to a lesser degree in the upper abdomen this has increased somewhat in the interval from prior exam. Musculoskeletal: No acute or significant osseous findings. IMPRESSION: Changes of acute pancreatitis relatively stable when compared with the prior exam. Considerable peripancreatic fluid as well as free fluid in the abdomen is seen. Wall thickening within the colon as described likely of a reactive nature given the free fluid within the abdomen.  Bilateral pleural effusions and lower lobe consolidation new when compared with the prior CT of the abdomen. These changes have been previously described on CT of the chest. Electronically Signed   By: Inez Catalina M.D.   On: 01/20/2018 13:53   Ct Chest Wo Contrast  Result Date: 01/19/2018 CLINICAL DATA:  Follow-up pleural effusion EXAM: CT CHEST WITHOUT CONTRAST TECHNIQUE: Multidetector CT imaging of the chest was performed following the standard protocol without IV contrast. COMPARISON:  Chest x-ray from earlier in the same day FINDINGS: Cardiovascular: Somewhat limited due to the lack of IV contrast. No cardiac enlargement is noted. The thoracic aorta is within normal limits. Left subclavian central line is noted in the mid superior vena cava. Mediastinum/Nodes: Endotracheal tube and nasogastric catheter are noted. The thoracic inlet is within normal limits. No definitive hilar or mediastinal adenopathy is noted although again somewhat limited evaluation due to the lack of IV contrast. Lungs/Pleura: Bilateral lower lobe infiltrate is noted left greater than right with associated moderate effusions left greater than right. Additionally cough left upper lobe infiltrate is noted. These are similar to that seen on recent chest x-ray and again worsened when compared with previous exams. Upper Abdomen: Within normal limits. Musculoskeletal: Within normal limits. IMPRESSION: Patchy multifocal infiltrates left greater than right with associated moderate effusions left greater than right. Electronically Signed   By: Inez Catalina M.D.   On: 01/19/2018 12:29   Dg Chest Port 1 View  Result Date: 01/21/2018 CLINICAL DATA:  Intubation. EXAM: PORTABLE CHEST 1  VIEW COMPARISON:  CT 01/19/2018.  Chest x-ray 01/19/2018. FINDINGS: Endotracheal tube, NG tube, left subclavian line stable position. Heart size stable. Diffuse bilateral pulmonary infiltrates/edema, progressed from prior exam. Persistent left-sided pleural  effusion. No pneumothorax. IMPRESSION: 1.  Lines and tubes in stable position. 2. Progressive bilateral pulmonary infiltrates/edema. Persistent left pleural effusion. Electronically Signed   By: Marcello Moores  Register   On: 01/21/2018 08:38   STUDIES:  CT A/P 5/25 > 1. Findings indicative of acute pancreatitis. Pancreas appears edematous with peripancreatic fluid. There is fluid tracking throughout the anterolateral abdominal regions with mesenteric thickening in these areas felt to be due to pancreatitis. There is localized partially loculated ascites in the dependent portion of the pelvis. Wall thickening involving multiple loops of small and large bowel, likely due to enterocolitis secondary to inflammation from the peripancreatic fluid/pancreatitis. No bowel obstruction. No frank abscess in the abdomen or pelvis. Appendix appears Normal. No renal or ureteral calculus. No hydronephrosis. Question sludge in gallbladder. Gallbladder wall does not appear appreciably thickened. Hepatic steatosis.  CULTURES: Blood 5/25 >>NGTD Blood 5/26>> NGTD Urine 5/27>> NGTD  ANTIBIOTICS: Zosyn 5/25 >>  SIGNIFICANT EVENTS: 5/25 > Presents to ED  5/26> Clinical worsening requiring CC consult 5/26> Pressors initiated 5/26> Surgical Consult 5/26> Nephro consult  LINES/TUBES: ETT 5/26 >> L [place] CVC 5/26>> R Art Line >> 5/26 R EJ >5/25   ASSESSMENT / PLAN: NEURO: A: Acute encephalopathy- metabolic and post ictal Alcohol Withdrawal with seizure CT head negative for acute intracranial process P: Continue CIWA protocol D/C neuro checks RASS goal of 0 at this point D/C precedex and versed Fentanyl drip Continue catapres patch No propofol ever Ativan PRN  Continue Keppra.  Folic Acid/Thimaine   CARDIAC: A: H/o HTN. CVP 14 Off levophed since 5/29 Tachycardia Non-invasive Flowtrack initiated 5/26 Elevated troponins>> ? Demand ischemia & renal failure Echo> EF 55%, MV trivial regurg, RV  systolic fxn mildly reduced, TV Mild regurg P: Monitor QTC MAP goal > 65 A-line- for more accurate BP monitoring  Continue Clonidine patch 0.1 mg weekly D/C pressors  PULMONARY: A: Acute hypoxic respiratory failure Intubated on mechanical ventilation Moderate pleural effusion noted on CT chest 5/28 CXR with progressive bilateral pulmonary infiltrates/edema and persistent left pleural effusion ABG 7.3/43/126/22 this morning. The same as prior On PS the 10/5 this morning P: Maintain sats> 94% Begin PS trials Trend ABG/CXR Diureses to address pleural effusion and O2 demand Pulmonary Hygiene  ID: A: Acute Pancreatitis Leukocytosis to 18.8 this morning Remains afebrile.  T-max 99.4 yesterday Lactic acidosis resolved Blood and urine cultures negative MRSA PCR negative Procalcitation increase to 2.75 P: Continue zosyn for pancreatitis and concern for pneumonia on CXR Trend WBC, fever curve and lactate F/U on BC Deescalate antibiotics  Endocrine: A: No previous h/o insulin dependence P: CBGs ISS  GI: A: Acute Pancreatitis secondary to ETOH Has sludge in galbladder but Alk phos and transaminases not markedly elevated Abdominal distension CT abdomen 5/29 with stable peripancreatic fluid as well as free fluid in the abdomen, wall thickening within the colon and bilateral pleural effusion Hypoalbuminemia  Concern for abdominal compartment syndrome 5/27>> APP 78 ( Bladder pressure 17, MAP 95) P:  Monitor bladder pressure q4 I reviewed CT of the abdomen, no change in pancreatitis Trend LFT's-stable Amylase and Lipase down-trending, normal at this point TF resumed KVO IVF  Heme: A: Thrombocytopenia: stable.?Sequestration Bleeding gums 5/27 with mouth care Anemia>>Hemodilutional. PT/INR within normal Leukocytosis P: Trend CBC's Avoid medications that may worsen Platelets Transfuse per  ICU protocol <7 Type & Cross done on admission DVT PPx->  SCDs  RENAL A: Anion Gap metabolic Acidosis- resolved Acute Kidney injury>> improving. UOP 0.25m/kg/hr Hypoalbuminemia Hypocalcemia>> resolved Hypokalemia: resolved Hypophos: resolved P: KVO IVF Renal signed off Avoid nephrotoxic medications and any contrast>> all imaging without contrast BMET in AM Lasix 40 mg IV q6 x3 doses Monitor UO hourly Replace electrolytes as needed.   Family member (father) updated at bedside 5/30  The patient is critically ill with multiple organ systems failure and requires high complexity decision making for assessment and support, frequent evaluation and titration of therapies, application of advanced monitoring technologies and extensive interpretation of multiple databases.   Critical Care Time devoted to patient care services described in this note is  37  Minutes. This time reflects time of care of this signee Dr WJennet Maduro This critical care time does not reflect procedure time, or teaching time or supervisory time of PA/NP/Med student/Med Resident etc but could involve care discussion time.  WRush Farmer M.D. LUrlog[facility] LLCPulmonary/Critical Care Medicine. Pager: [phone] After hours pager: [phone]

## 2018-01-21 NOTE — Progress Notes (Signed)
Nutrition Follow-up  INTERVENTION:   Vital 1.5 @ 35 ml/hr Added 30 ml Prostat BID  Provides: 1460 kcals, 87 grams protein, 638 ml free water. Meets 103% calorie needs and 100% of protein needs.   NUTRITION DIAGNOSIS:   Inadequate oral intake related to inability to eat as evidenced by NPO status. Ongoing.   GOAL:   Patient will meet greater than or equal to 90% of their needs  Met.   MONITOR:   Diet advancement, Vent status, Labs, Weight trends, TF tolerance, I & O's  ASSESSMENT:   Patient with PMH significant for seizure disorder and HTN. Presents this admission with complaints of significant abdominal pain with associated nausea/vomiting. Admitted for acute pancreatitis most likely alcohol related.    5/26- pt became less responsive with noted seizure, pt intubated  5/29 TF started  Spoke with RN at bedside.  Pt off all pressors Noted abd distended but bladder pressures are WNL  Patient is currently intubated on ventilator support MV: 7.4 L/min Temp (24hrs), Avg:99.1 F (37.3 C), Min:98 F (36.7 C), Max:99.9 F (37.7 C) BP: 96/77 MAP: 85  I/O: +12 L since admit on lasix, weight up 30 lb, abd distended UOP: 1015 ml x 24 hours   Medications reviewed and include: folic acid, thiamine, novolog, fentanyl  Labs reviewed: lipase 51  Diet Order:   Diet Order           Diet NPO time specified  Diet effective now          EDUCATION NEEDS:   Not appropriate for education at this time  Skin:  Skin Assessment: Reviewed RN Assessment  Last BM:  5/29  Height:   Ht Readings from Last 1 Encounters:  01/17/18 _0  (1.575 m)    Weight:   Wt Readings from Last 1 Encounters:  01/21/18 147 lb 0.8 oz (66.7 kg)    Ideal Body Weight:  50 kg  BMI:  Body mass index is 26.9 kg/m.  Estimated Nutritional Needs:   Kcal:  1415 kcal  Protein:  75-90 g  Fluid:  >1.4 L/day  Maylon Peppers RD, LDN, CNSC [phone] Pager [phone] After Hours Pager

## 2018-01-21 NOTE — Progress Notes (Signed)
eLink Physician-Brief Progress Note Patient Name: Donna Short DOB: [birth year] MRN: [id number]   Date of Service  01/21/2018  HPI/Events of Note  Fever, sinus tachycardia, hypertension.  eICU Interventions  Prn tylenol, lopressor.        Atarah Cadogan 01/21/2018, 7:53 PM

## 2018-01-21 NOTE — Progress Notes (Addendum)
.. ..  Name: Donna Short MRN: [id number] DOB: Feb 17, 1991    ADMISSION DATE:  01/16/2018 CONSULTATION DATE:  01/17/2018  REFERRING MD :  TRH   CHIEF COMPLAINT:  Seizures, Altered mental status  BRIEF PATIENT DESCRIPTION: 27 yr old female with PMHx HTN, Asthma, Seizures and known ETOH abuse presents with abdominal pain, nausea, vomiting . CTA/P shows pancreatitis. HTN and Tachycardic last ETOH was on 5/21=5/22 now s/p seizure obtunded. Intubated. PCCM consulted for mgmt  SIGNIFICANT EVENTS  Acute pancreatitis HTN and Tachy- poss. Acute ETOH withdrawal Seizure  Intubated  STUDIES:  CTA/P:  IMPRESSION: 1. Findings indicative of acute pancreatitis. Pancreas appears edematous with peripancreatic fluid. There is fluid tracking throughout the anterolateral abdominal regions with mesenteric thickening in these areas felt to be due to pancreatitis. There is localized partially loculated ascites in the dependent portion of the pelvis. 2. Wall thickening involving multiple loops of small and large bowel, likely due to enterocolitis secondary to inflammation from the peripancreatic fluid/pancreatitis. No bowel obstruction. 3. No frank abscess in the abdomen or pelvis. Appendix appears normal. 4.  No renal or ureteral calculus.  No hydronephrosis. 5. Question sludge in gallbladder. Gallbladder wall does not appear appreciably thickened. 6.  Hepatic steatosis.  CXR 5/26>>  New left subclavian central venous line is well positioned, tip at the caval atrial junction. No pneumothorax. New left lung base opacity, most likely atelectasis.  Consider pneumonia if there are consistent clinical findings.  Brief History:  (HPI obtained from the accounts of other providers, EMR, patient prior to seizure) 27 yr old fmeale with PMHx ETOH, asthma, HTN Seizures and a h/o binge drinking ( last ETOH use 5/21) Pt presented to the [facility] on 01/16/18 she had initial complaints on nausea, vomiting and abdominal  pain. Per the Triage note her symptoms began 3 days ago and were accompanied with frequent urination but she denied fevers. She was nauseous on 5/21 and was seen and given antiemetics she was unable to tolerate PO food or drink despite the antiemetic therapy. Her pain became much worse around 11PM on 5/24 and that is when the vomiting started.  She received empiric antibiotics, labs were sent ad pt was started on IVF. Only one set of cultures obtained before antibiotics. She was admitted to the hospitalist.   Over night 5/25-5/26 noted to became Hypertensive, Tachycardic, and diaphoretic. Started on CIWA protocol. At 0500 patient became less responsive with snorous respirations with noted seizure requiring intubation and transfer to ICU.   SUBJECTIVE: No acute events overnight.  Off Levophed drip.  Transition to PSV 10/5 this morning.  VITAL SIGNS: Temp:  [98 F (36.7 C)-99.4 F (37.4 C)] 99.4 F (37.4 C) (05/30 0430) Pulse Rate:  [98-140] 140 (05/30 0718) Resp:  [14-35] 22 (05/30 0718) BP: (95-183)/(65-94) 183/94 (05/30 0718) SpO2:  [98 %-100 %] 100 % (05/30 0718) Arterial Line BP: (96-169)/(52-85) 165/78 (05/30 0615) FiO2 (%):  [40 %] 40 % (05/30 0718) Weight:  [66.7 kg (147 lb 0.8 oz)] 66.7 kg (147 lb 0.8 oz) (05/30 0335)  PHYSICAL EXAMINATION: General: stable, follows commands, on PSV 10/5 Neuro: Opens eyes and moves extremities HEENT:  [place]/AT, PERRL, EOM-I and MMM Cardiovascular:  Sinus tach, Nl S1/S2 and -M/R/G Lungs: Coarse BS diffusely Abdomen:  Distended, somewhat tense, hypoactive bowel sounds Musculoskeletal:  -edema and -tenderness Skin:  Warm and intact  Recent Labs  Lab 01/19/18 1659 01/20/18 0401 01/21/18 0336  NA 138 139 142  K 3.6 4.0 3.8  CL 105 105  108  CO2 _0 BUN 27* 24* 20  CREATININE 0.86 0.89 0.83  GLUCOSE 116* 104* 136*   Recent Labs  Lab 01/19/18 0329 01/20/18 0401 01/21/18 0336  HGB 9.3* 8.8* 8.8*  HCT 26.7* 25.8* 26.0*  WBC 6.8 8.0  18.8*  PLT 74* 70* 74*   Ct Abdomen Pelvis Wo Contrast  Result Date: 01/20/2018 CLINICAL DATA:  History of pancreatitis EXAM: CT ABDOMEN AND PELVIS WITHOUT CONTRAST TECHNIQUE: Multidetector CT imaging of the abdomen and pelvis was performed following the standard protocol without IV contrast. COMPARISON:  01/16/2018 FINDINGS: Lower chest: Moderate to large bilateral pleural effusions are seen these are similar to a CT of the chest from the previous day but increased when compared with the prior CT examination. Bilateral lower lobe consolidation is noted as well. Hepatobiliary: Liver and gallbladder are within normal limits as visualized. Pancreas: The pancreas again demonstrates significant enlargement with peripancreatic inflammatory change in consistent with known pancreatitis. Lack of IV contrast somewhat limits evaluation of the gland itself. Spleen: Spleen is within normal limits. Adrenals/Urinary Tract: The adrenal glands and kidneys show no focal abnormality. No calculi or obstructive changes are seen. The bladder is decompressed by Foley catheter. Stomach/Bowel: Nasogastric catheter is noted within the stomach. Some thickening of the colonic wall is noted likely reactive to the free fluid within the abdomen. The appendix appears within normal limits. No obstructive changes are seen. Vascular/Lymphatic: No significant vascular findings are present. No enlarged abdominal or pelvic lymph nodes. Reproductive: Uterus and bilateral adnexa are unremarkable. Other: Free fluid is noted within the pelvis and to a lesser degree in the upper abdomen this has increased somewhat in the interval from prior exam. Musculoskeletal: No acute or significant osseous findings. IMPRESSION: Changes of acute pancreatitis relatively stable when compared with the prior exam. Considerable peripancreatic fluid as well as free fluid in the abdomen is seen. Wall thickening within the colon as described likely of a reactive nature  given the free fluid within the abdomen. Bilateral pleural effusions and lower lobe consolidation new when compared with the prior CT of the abdomen. These changes have been previously described on CT of the chest. Electronically Signed   By: Inez Catalina M.D.   On: 01/20/2018 13:53   Ct Chest Wo Contrast  Result Date: 01/19/2018 CLINICAL DATA:  Follow-up pleural effusion EXAM: CT CHEST WITHOUT CONTRAST TECHNIQUE: Multidetector CT imaging of the chest was performed following the standard protocol without IV contrast. COMPARISON:  Chest x-ray from earlier in the same day FINDINGS: Cardiovascular: Somewhat limited due to the lack of IV contrast. No cardiac enlargement is noted. The thoracic aorta is within normal limits. Left subclavian central line is noted in the mid superior vena cava. Mediastinum/Nodes: Endotracheal tube and nasogastric catheter are noted. The thoracic inlet is within normal limits. No definitive hilar or mediastinal adenopathy is noted although again somewhat limited evaluation due to the lack of IV contrast. Lungs/Pleura: Bilateral lower lobe infiltrate is noted left greater than right with associated moderate effusions left greater than right. Additionally cough left upper lobe infiltrate is noted. These are similar to that seen on recent chest x-ray and again worsened when compared with previous exams. Upper Abdomen: Within normal limits. Musculoskeletal: Within normal limits. IMPRESSION: Patchy multifocal infiltrates left greater than right with associated moderate effusions left greater than right. Electronically Signed   By: Inez Catalina M.D.   On: 01/19/2018 12:29   Dg Chest Port 1 View  Result Date: 01/19/2018 CLINICAL  DATA:  Check endotracheal tube placement EXAM: PORTABLE CHEST 1 VIEW COMPARISON:  01/18/2018 FINDINGS: Cardiac shadow is stable. Left-sided pleural effusion is again noted and slightly increased when compared with the prior exam. Increasing consolidation in the left  base is noted as well. Endotracheal tube and nasogastric catheter are again seen in satisfactory position. Left subclavian central line is noted as well. The right lung remains clear. IMPRESSION: Increase in left basilar consolidation and left pleural effusion. Electronically Signed   By: Inez Catalina M.D.   On: 01/19/2018 08:51   STUDIES:  CT A/P 5/25 > 1. Findings indicative of acute pancreatitis. Pancreas appears edematous with peripancreatic fluid. There is fluid tracking throughout the anterolateral abdominal regions with mesenteric thickening in these areas felt to be due to pancreatitis. There is localized partially loculated ascites in the dependent portion of the pelvis. Wall thickening involving multiple loops of small and large bowel, likely due to enterocolitis secondary to inflammation from the peripancreatic fluid/pancreatitis. No bowel obstruction. No frank abscess in the abdomen or pelvis. Appendix appears Normal. No renal or ureteral calculus. No hydronephrosis. Question sludge in gallbladder. Gallbladder wall does not appear appreciably thickened. Hepatic steatosis.  CULTURES: Blood 5/25 >>NGTD Blood 5/26>> NGTD Urine 5/27>> NGTD  ANTIBIOTICS: Zosyn 5/25 >>  SIGNIFICANT EVENTS: 5/25 > Presents to ED  5/26> Clinical worsening requiring CC consult 5/26> Pressors initiated 5/26> Surgical Consult 5/26> Nephro consult  LINES/TUBES: ETT 5/26 >> L [place] CVC 5/26>> R Art Line >> 5/26 R EJ >5/25   ASSESSMENT / PLAN: NEURO: A: Acute encephalopathy- metabolic and post ictal Alcohol Withdrawal with seizure CT head negative for acute intracranial process P: CIWA protocol  Neurochecks Q 1 RASS goal:  0 to -1 Continue fentanyl.  Off Precedex and Versed On Catapress patch as a precedex sparing agent Avoid propofol- lipase markedly elevated Ativan PRN  Continue Keppra.  Folic Acid/Thimaine   CARDIAC: A: H/o HTN. CVP 14 Off levophed since  5/29 Tachycardia Non-invasive Flowtrack initiated 5/26 Elevated troponins>> ? Demand ischemia & renal failure Echo> EF 55%, MV trivial regurg, RV systolic fxn mildly reduced, TV Mild regurg P: Monitor QTC MAP goal > 65 A-line- for more accurate BP monitoring  Clonidine patch 0.1 mg weekly D/C clear sight since patient is off pressors at this point and is hypertensive  PULMONARY: A: Acute hypoxic respiratory failure Intubated on mechanical ventilation Moderate pleural effusion noted on CT chest 5/28 CXR with progressive bilateral pulmonary infiltrates/edema and persistent left pleural effusion ABG 7.3/43/126/22 this morning. The same as prior On PS the 10/5 this morning P: Maintain sats> 94% Trend ABG/CXR Thoracocentesis today Pulmonary Hygiene  ID: A: Acute Pancreatitis Leukocytosis to 18.8 this morning Remains afebrile.  T-max 99.4 yesterday Lactic acidosis resolved Blood and urine cultures negative MRSA PCR negative Procalcitation increase to 2.75 P: Continue ABX as above - Zosyn Trend WBC, fever curve and lactate Repeat blood culture May need thoracocentesis for pleural effusion Deescalate antibiotics  Endocrine: A: No previous h/o insulin dependence P: CBG ISS  GI: A: Acute Pancreatitis secondary to ETOH Has sludge in galbladder but Alk phos and transaminases not markedly elevated Abdominal distension CT abdomen 5/29 with stable peripancreatic fluid as well as free fluid in the abdomen, wall thickening within the colon and bilateral pleural effusion Hypoalbuminemia  Concern for abdominal compartment syndrome 5/27>> APP 78 ( Bladder pressure 17, MAP 95) P:  Monitor bladder pressures Q 4. Last bladder pressure 18 Follow Abdominal Perfusion pressures Q 4 ( Goal is >  60 mmHg) Trend LFT's-stable Amylase and Lipase down-trending TF resumed KVO IVF  Heme: A: Thrombocytopenia: stable.?Sequestration Bleeding gums 5/27 with mouth  care Anemia>>Hemodilutional. PT/INR within normal Leukocytosis P: Trend CBC's Avoid medications that may worsen Platelets Transfuse per ICU protocol <7 Type & Cross done on admission DVT PPx-> SCDs  RENAL A: Anion Gap metabolic Acidosis- resolved Acute Kidney injury>> improving. UOP 0.91m/kg/hr Hypoalbuminemia Hypocalcemia>> resolved Hypokalemia: resolved Hypophos: resolved P: KVO IVF Renal signed off Avoid nephrotoxic medications and any contrast>> all imaging without contrast Monitor and replete electrolytes CMET daily  Monitor UO hourly Replace electrolytes as needed.   Family member (father) updated at bedside 5/30    TWendee [email or web address] Pager [phone] [id number]/30/19  9:03 AM  WRush Farmer M.D. LHighline South Ambulatory SurgeryPulmonary/Critical Care Medicine. Pager: [phone] After hours pager: [phone]

## 2018-01-21 NOTE — Progress Notes (Signed)
CRITICAL VALUE ALERT  Critical Value:  Lactic Acid 2.3  Date & Time Notied: 01/21/2018@2017   Provider Notified:  Dr. Craige Cotta   Orders Received/Actions taken: See orders

## 2018-01-21 NOTE — Progress Notes (Signed)
Pt HR 140's and oral temp 99.9. Critical Care MD notified and orders given to draw labs and cultures. Please see order history for details.

## 2018-01-22 ENCOUNTER — Inpatient Hospital Stay (HOSPITAL_COMMUNITY): Payer: Managed Care, Other (non HMO)

## 2018-01-22 DIAGNOSIS — J189 Pneumonia, unspecified organism: Secondary | ICD-10-CM

## 2018-01-22 DIAGNOSIS — J810 Acute pulmonary edema: Secondary | ICD-10-CM

## 2018-01-22 LAB — COMPREHENSIVE METABOLIC PANEL
ALT: 22 U/L (ref 14–54)
AST: 71 U/L — ABNORMAL HIGH (ref 15–41)
Albumin: 1.6 g/dL — ABNORMAL LOW (ref 3.5–5.0)
Alkaline Phosphatase: 79 U/L (ref 38–126)
Anion gap: 8 (ref 5–15)
BUN: 17 mg/dL (ref 6–20)
CHLORIDE: 108 mmol/L (ref 101–111)
CO2: 29 mmol/L (ref 22–32)
CREATININE: 0.89 mg/dL (ref 0.44–1.00)
Calcium: 8.2 mg/dL — ABNORMAL LOW (ref 8.9–10.3)
GFR calc Af Amer: >60 mL/min (ref >60)
Glucose, Bld: 155 mg/dL — ABNORMAL HIGH (ref 65–99)
POTASSIUM: 3.4 mmol/L — AB (ref 3.5–5.1)
Sodium: 145 mmol/L (ref 135–145)
Total Bilirubin: 0.7 mg/dL (ref 0.3–1.2)
Total Protein: 5.4 g/dL — ABNORMAL LOW (ref 6.5–8.1)

## 2018-01-22 LAB — PROCALCITONIN: PROCALCITONIN: 8.89 ng/mL

## 2018-01-22 LAB — GLUCOSE, CAPILLARY
GLUCOSE-CAPILLARY: 155 mg/dL — AB (ref 65–99)
GLUCOSE-CAPILLARY: 191 mg/dL — AB (ref 65–99)
GLUCOSE-CAPILLARY: 167 mg/dL — AB (ref 65–99)
Glucose-Capillary: 133 mg/dL — ABNORMAL HIGH (ref 65–99)
Glucose-Capillary: 138 mg/dL — ABNORMAL HIGH (ref 65–99)
Glucose-Capillary: 103 mg/dL — ABNORMAL HIGH (ref 65–99)

## 2018-01-22 LAB — CBC WITH DIFFERENTIAL/PLATELET
BASOS PCT: 1 %
Basophils Absolute: 0.2 10*3/uL — ABNORMAL HIGH (ref 0.0–0.1)
EOS PCT: 0 %
Eosinophils Absolute: 0 10*3/uL (ref 0.0–0.7)
HEMATOCRIT: 24.4 % — AB (ref 36.0–46.0)
Hemoglobin: 8.1 g/dL — ABNORMAL LOW (ref 12.0–15.0)
LYMPHS ABS: 1.3 10*3/uL (ref 0.7–4.0)
Lymphocytes Relative: 6 %
MCH: 37.5 pg — AB (ref 26.0–34.0)
MCHC: 33.2 g/dL (ref 30.0–36.0)
MCV: 113 fL — AB (ref 78.0–100.0)
MONO ABS: 2.7 10*3/uL — AB (ref 0.1–1.0)
MONOS PCT: 13 %
Neutro Abs: 16.9 10*3/uL — ABNORMAL HIGH (ref 1.7–7.7)
Neutrophils Relative %: 80 %
PLATELETS: 101 10*3/uL — AB (ref 150–400)
RBC: 2.16 MIL/uL — ABNORMAL LOW (ref 3.87–5.11)
RDW: 13.2 % (ref 11.5–15.5)
WBC: 21.1 10*3/uL — AB (ref 4.0–10.5)

## 2018-01-22 LAB — CULTURE, BLOOD (ROUTINE X 2): CULTURE: NO GROWTH

## 2018-01-22 LAB — LIPASE, BLOOD: LIPASE: 46 U/L (ref 11–51)

## 2018-01-22 LAB — MAGNESIUM: Magnesium: 1.2 mg/dL — ABNORMAL LOW (ref 1.7–2.4)

## 2018-01-22 LAB — PHOSPHORUS: Phosphorus: 4.4 mg/dL (ref 2.5–4.6)

## 2018-01-22 MED ORDER — THIAMINE HCL 100 MG/ML IJ SOLN
100.0000 mg | Freq: Every day | INTRAMUSCULAR | Status: DC
  Administered 2018-01-23 – 2018-01-25 (×3): 100 mg via INTRAVENOUS
  Filled 2018-01-22 (×3): qty 2

## 2018-01-22 MED ORDER — FOLIC ACID 5 MG/ML IJ SOLN
1.0000 mg | Freq: Every day | INTRAMUSCULAR | Status: DC
  Administered 2018-01-23 – 2018-01-25 (×3): 1 mg via INTRAVENOUS
  Filled 2018-01-22 (×4): qty 0.2

## 2018-01-22 MED ORDER — VANCOMYCIN HCL IN DEXTROSE 750-5 MG/150ML-% IV SOLN
750.0000 mg | Freq: Three times a day (TID) | INTRAVENOUS | Status: DC
  Administered 2018-01-22 – 2018-01-24 (×6): 750 mg via INTRAVENOUS
  Filled 2018-01-22 (×6): qty 150

## 2018-01-22 MED ORDER — MAGNESIUM SULFATE 2 GM/50ML IV SOLN
2.0000 g | Freq: Once | INTRAVENOUS | Status: AC
  Administered 2018-01-22: 2 g via INTRAVENOUS
  Filled 2018-01-22: qty 50

## 2018-01-22 MED ORDER — FAMOTIDINE IN NACL 20-0.9 MG/50ML-% IV SOLN
20.0000 mg | Freq: Two times a day (BID) | INTRAVENOUS | Status: DC
  Administered 2018-01-22 – 2018-01-24 (×4): 20 mg via INTRAVENOUS
  Filled 2018-01-22 (×4): qty 50

## 2018-01-22 MED ORDER — POTASSIUM CHLORIDE 20 MEQ/15ML (10%) PO SOLN
40.0000 meq | Freq: Once | ORAL | Status: AC
  Administered 2018-01-22: 40 meq
  Filled 2018-01-22: qty 30

## 2018-01-22 MED ORDER — VANCOMYCIN HCL 10 G IV SOLR
1250.0000 mg | Freq: Once | INTRAVENOUS | Status: AC
  Administered 2018-01-22: 1250 mg via INTRAVENOUS
  Filled 2018-01-22: qty 1250

## 2018-01-22 MED ORDER — FUROSEMIDE 10 MG/ML IJ SOLN
40.0000 mg | Freq: Four times a day (QID) | INTRAMUSCULAR | Status: AC
Start: 2018-01-22 — End: 2018-01-22
  Administered 2018-01-22 (×2): 40 mg via INTRAVENOUS
  Filled 2018-01-22 (×2): qty 4

## 2018-01-22 MED ORDER — FENTANYL CITRATE (PF) 100 MCG/2ML IJ SOLN
25.0000 ug | INTRAMUSCULAR | Status: DC | PRN
  Administered 2018-01-24: 25 ug via INTRAVENOUS
  Filled 2018-01-22: qty 2

## 2018-01-22 NOTE — Progress Notes (Addendum)
.. ..  Name: Donna Short MRN: [id number] DOB: 1991-06-19    ADMISSION DATE:  01/16/2018 CONSULTATION DATE:  01/17/2018  REFERRING MD :  TRH   CHIEF COMPLAINT:  Seizures, Altered mental status  BRIEF PATIENT DESCRIPTION:  27 yr old female with PMHx HTN, Asthma, Seizures and known ETOH abuse   Presents 5/25 with abdominal pain, nausea, vomiting . CTA/P shows pancreatitis. HTN and Tachycardic last ETOH was on 5/21, now s/p seizure obtunded. Intubated.   SUBJECTIVE: Weaning this AM. Diuresis 4L 5/30  Tmax 102 overnight   VITAL SIGNS: Temp:  [99.4 F (37.4 C)-102 F (38.9 C)] 99.4 F (37.4 C) (05/31 0346) Pulse Rate:  [142-153] 153 (05/30 2000) Resp:  [13-30] 15 (05/31 0600) BP: (109-141)/(66-100) 130/90 (05/31 0600) SpO2:  [99 %-100 %] 99 % (05/31 0422) Arterial Line BP: (91-149)/(62-120) 134/80 (05/31 0600) FiO2 (%):  [40 %] 40 % (05/31 0422) Weight:  [64.6 kg (142 lb 6.7 oz)] 64.6 kg (142 lb 6.7 oz) (05/31 0400)  PHYSICAL EXAMINATION: General: Young adult female, on vent  Neuro: Opens eyes to verbal stimulation  HEENT:  ETT in place  Cardiovascular:  Tachy, no MRG Lungs: Coarse Breath sounds, no wheeze Abdomen: distended, tender, active bowel sounds  Musculoskeletal:  + 1 pedal edema  Skin:  Warm and intact  Recent Labs  Lab 01/20/18 0401 01/21/18 0336 01/22/18 0528  NA 139 142 145  K 4.0 3.8 3.4*  CL 105 108 108  CO2 23 24 29   BUN 24* 20 17  CREATININE 0.89 0.83 0.89  GLUCOSE 104* 136* 155*   Recent Labs  Lab 01/20/18 0401 01/21/18 0336 01/22/18 0528  HGB 8.8* 8.8* 8.1*  HCT 25.8* 26.0* 24.4*  WBC 8.0 18.8* 21.1*  PLT 70* 74* 101*   Ct Abdomen Pelvis Wo Contrast  Result Date: 01/20/2018 CLINICAL DATA:  History of pancreatitis EXAM: CT ABDOMEN AND PELVIS WITHOUT CONTRAST TECHNIQUE: Multidetector CT imaging of the abdomen and pelvis was performed following the standard protocol without IV contrast. COMPARISON:  01/16/2018 FINDINGS: Lower chest:  Moderate to large bilateral pleural effusions are seen these are similar to a CT of the chest from the previous day but increased when compared with the prior CT examination. Bilateral lower lobe consolidation is noted as well. Hepatobiliary: Liver and gallbladder are within normal limits as visualized. Pancreas: The pancreas again demonstrates significant enlargement with peripancreatic inflammatory change in consistent with known pancreatitis. Lack of IV contrast somewhat limits evaluation of the gland itself. Spleen: Spleen is within normal limits. Adrenals/Urinary Tract: The adrenal glands and kidneys show no focal abnormality. No calculi or obstructive changes are seen. The bladder is decompressed by Foley catheter. Stomach/Bowel: Nasogastric catheter is noted within the stomach. Some thickening of the colonic wall is noted likely reactive to the free fluid within the abdomen. The appendix appears within normal limits. No obstructive changes are seen. Vascular/Lymphatic: No significant vascular findings are present. No enlarged abdominal or pelvic lymph nodes. Reproductive: Uterus and bilateral adnexa are unremarkable. Other: Free fluid is noted within the pelvis and to a lesser degree in the upper abdomen this has increased somewhat in the interval from prior exam. Musculoskeletal: No acute or significant osseous findings. IMPRESSION: Changes of acute pancreatitis relatively stable when compared with the prior exam. Considerable peripancreatic fluid as well as free fluid in the abdomen is seen. Wall thickening within the colon as described likely of a reactive nature given the free fluid within the abdomen. Bilateral pleural effusions and lower lobe  consolidation new when compared with the prior CT of the abdomen. These changes have been previously described on CT of the chest. Electronically Signed   By: Inez Catalina M.D.   On: 01/20/2018 13:53   Dg Chest Port 1 View  Result Date: 01/21/2018 CLINICAL  DATA:  Intubation. EXAM: PORTABLE CHEST 1 VIEW COMPARISON:  CT 01/19/2018.  Chest x-ray 01/19/2018. FINDINGS: Endotracheal tube, NG tube, left subclavian line stable position. Heart size stable. Diffuse bilateral pulmonary infiltrates/edema, progressed from prior exam. Persistent left-sided pleural effusion. No pneumothorax. IMPRESSION: 1.  Lines and tubes in stable position. 2. Progressive bilateral pulmonary infiltrates/edema. Persistent left pleural effusion. Electronically Signed   By: Marcello Moores  Register   On: 01/21/2018 08:38   STUDIES:  CT A/P 5/25 > 1. Findings indicative of acute pancreatitis. Pancreas appears edematous with peripancreatic fluid. There is fluid tracking throughout the anterolateral abdominal regions with mesenteric thickening in these areas felt to be due to pancreatitis. There is localized partially loculated ascites in the dependent portion of the pelvis. Wall thickening involving multiple loops of small and large bowel, likely due to enterocolitis secondary to inflammation from the peripancreatic fluid/pancreatitis. No bowel obstruction. No frank abscess in the abdomen or pelvis. Appendix appears Normal. No renal or ureteral calculus. No hydronephrosis. Question sludge in gallbladder. Gallbladder wall does not appear appreciably thickened. Hepatic steatosis. CXR 5/26 >New left subclavian central venous line is well positioned, tip at the caval atrial junction. No pneumothorax. New left lung base opacity, most likely atelectasis.  Consider pneumonia if there are consistent clinical findings.  CULTURES: Blood 5/25 >NGTD Blood 5/26 > NGTD Urine 5/27 > NGTD Blood 5/30 >>   ANTIBIOTICS: Zosyn 5/25 >>  SIGNIFICANT EVENTS: 5/25 > Presents to ED  5/26> Clinical worsening requiring CC consult > Intubation  5/26> Pressors initiated 5/26> Surgical Consult 5/26> Nephro consult  LINES/TUBES: ETT 5/26 >> L  CVC 5/26>> R Art Line  5/26 >  R EJ >5/25   ASSESSMENT  / PLAN: NEURO: A: Acute encephalopathy- metabolic and post ictal Alcohol Withdrawal with seizure -CT head negative for acute intracranial process P: Continue CIWA protocol > No propofol due to pancreatitis  RASS goal of 0  Wean Fentanyl drip, Ativan PRN  Continue catapres patch Continue Keppra.  Folic Acid/Thimaine   CARDIAC: A: Tachycardia Elevated troponin in setting of demand ischemia & renal failure H/O HTN -Levophed off 5/29 -Non-invasive Flowtrack initiated 5/26 -Echo > EF 55%, MV trivial regurg, RV systolic fxn mildly reduced, TV Mild regurg P: Monitor QTC MAP goal > 65 Continue Clonidine patch 0.1 mg weekly  PULMONARY: A: Acute hypoxic respiratory failure with progressive bilateral pulmonary infiltrates/edema and persistent left pleural effusion -Moderate pleural effusion noted on CT chest 5/28 P: Maintain sats> 94% Begin PS trials > Currently weaning 5/5  Trend ABG/CXR Continue Diureses > 4L out 5/30  Pulmonary Hygiene  ID: A: Acute Pancreatitis Leukocytosis   18.8 > 21.1 >> -Cultures remain negative, MRSA PCR negative  Febrile  -T-max 102  -PCT 8.89  Lactic acidosis > resolved P: Continue zosyn for pancreatitis and concern for pneumonia on CXR (Day 6)  Trend WBC and Fever curve  Follow Culture Data   Endocrine: A: Hyperglycemia  P: Trend Glucose  SSI   GI: A: Acute Pancreatitis secondary to ETOH Has sludge in galbladder but Alk phos and transaminases not markedly elevated Abdominal distension CT abdomen 5/29 with stable peripancreatic fluid as well as free fluid in the abdomen, wall thickening within the colon and  bilateral pleural effusion Hypoalbuminemia  Concern for abdominal compartment syndrome 5/27 > APP 78 ( Bladder pressure 17, MAP 95) P:  Monitor bladder pressure q shift (Last 17)  Trend LFT Amylase and Lipase down-trending, normal at this point TF resumed KVO IVF  Heme: A: Thrombocytopenia:  stable.?Sequestration Bleeding gums 5/27 with mouth care Anemia>>Hemodilutional. PT/INR within normal Leukocytosis P: Trend CBC's Avoid medications that may worsen Platelets Transfuse for hemoglobin <7 DVT PPx-> SCDs  RENAL A: Anion Gap metabolic Acidosis- resolved Acute Kidney injury > improving Hypoalbuminemia Hypocalcemia > resolved Hypokalemia: resolved Hypophos: resolved P: Trend BMP  Strict I & O  Avoid nephrotoxic medications and any contrast > all imaging without contrast Replace electrolytes as needed.  Lasix 40 meq x 2   Family member: Mother and Father updated at bedside 5/31 CC Time: 31 minutes   Hayden Pedro, AGACNP-BC [place]  Pgr: [phone]  PCCM Pgr: [phone]  Attending Note:  27 year old female with alcohol abuse history presenting with pancreatitis and associated respiratory failure.  Patient diuresed very well yesterday and is weaning this AM.  Continues to have.  Pan cultures done yesterday.  On exam, clear lungs bilaterally with improvement in distension of the abdomen.  I reviewed CXR myself, improved edema and pleural effusion.  Will restart vancomycin.  F/U on cultures.  Proceed with extubation today.  IS and flutter valve.  OOB and PT.  Titrate O2 for sat of 88-92%.  D/C all sedation.  Will evaluate post sedation and extubation.    The patient is critically ill with multiple organ systems failure and requires high complexity decision making for assessment and support, frequent evaluation and titration of therapies, application of advanced monitoring technologies and extensive interpretation of multiple databases.   Critical Care Time devoted to patient care services described in this note is  37  Minutes. This time reflects time of care of this signee Dr Jennet Maduro. This critical care time does not reflect procedure time, or teaching time or supervisory time of PA/NP/Med student/Med Resident etc but could involve  care discussion time.  Rush Farmer, M.D. [facility] Pulmonary/Critical Care Medicine. Pager: [phone]. After hours pager: [phone].

## 2018-01-22 NOTE — Progress Notes (Signed)
Dr. Molli Knock stated that we could stop the bladder pressure qshift. Will continue to monitor.

## 2018-01-22 NOTE — Progress Notes (Addendum)
Pharmacy Antibiotic Note  Romona CurlsShamika M Contino is a [age] y.o. female admitted on 01/16/2018 with sepsis.  Pharmacy has been consulted for Zosyn dosing. Tmax 102, renal function is stable, WBC and lactic acid trending up. D6 zosyn, now consulted to dose vancomycin for potential HCAP per MD.  Plan: - Continue Zosyn 3.375g IV q8h for now, and potentially change therapy if clinical picture continues worsens  - Monitor clinic progress, WBC, TMax, renal function, electrolytes - F/u BCx, C/S, future de-escalation, & length of therapy  Addendum: - Vancomycin 1250 mg IV x1 loading dose, then - Vancomycin 750 mg IV q8h - Continue Zosyn 3.375g IV q8h  - Monitor clinic progress, renal function, electrolytes - F/u BCx, C/S, vancomycin trough levels, future de-escalation, & length of therapy  Height: 5\' 2"  (157.5 cm) Weight: 142 lb 6.7 oz (64.6 kg) IBW/kg (Calculated) : 50.1  Temp (24hrs), Avg:100.1 F (37.8 C), Min:99.4 F (37.4 C), Max:102 F (38.9 C)  Recent Labs  Lab 01/18/18 0351 01/18/18 0830  01/18/18 1934  01/19/18 0329 01/19/18 1659 01/20/18 0401 01/21/18 0336 01/21/18 1501 01/21/18 1828 01/22/18 0528  WBC 4.0  --   --   --   --  6.8  --  8.0 18.8*  --   --  21.1*  CREATININE 1.02*  --    < > 1.00   < > 0.98 0.86 0.89 0.83  --   --  0.89  LATICACIDVEN  --  2.3*  --  1.8  --  1.4  --   --   --  2.1* 2.3*  --    < > = values in this interval not displayed.    Estimated Creatinine Clearance: 82.3 mL/min (by C-G formula based on SCr of 0.89 mg/dL).    Allergies  Allergen Reactions  . Benadryl [Diphenhydramine Hcl (Sleep)] Rash  . Latex Rash  . Penicillins Itching and Rash    Has patient had a PCN reaction causing immediate rash, facial/tongue/throat swelling, SOB or lightheadedness with hypotension: Yes Has patient had a PCN reaction causing severe rash involving mucus membranes or skin necrosis: No Has patient had a PCN reaction that required hospitalization: Yes Has patient had  a PCN reaction occurring within the last 10 years: No If all of the above answers are "NO", then may proceed with Cephalosporin use.;  . Shrimp [Shellfish Allergy] Hives and Rash    Antimicrobials this admission: Zosyn 3.375 IV q8h 5/25 >>  Vanc 5/31 >>  Dose adjustments this admission: None  Microbiology results: 5/25 BCx: NGTD 5/26 BCx: NGTD 5/26 MRSA PCR: Neg 5/27 UCx: NGTD 5/30 BCx: NGTD   Donnella Biyler Juvencio Verdi, PharmD PGY1 Acute Care Pharmacy Resident 01/22/2018 10:32 AM

## 2018-01-22 NOTE — Progress Notes (Signed)
Wasted 125ml of fentanyl with Malachy ChamberErin Curry. Will continue to monitor.

## 2018-01-23 ENCOUNTER — Inpatient Hospital Stay (HOSPITAL_COMMUNITY): Payer: Managed Care, Other (non HMO)

## 2018-01-23 DIAGNOSIS — K859 Acute pancreatitis without necrosis or infection, unspecified: Secondary | ICD-10-CM

## 2018-01-23 DIAGNOSIS — G9340 Encephalopathy, unspecified: Secondary | ICD-10-CM

## 2018-01-23 HISTORY — DX: Acute pancreatitis without necrosis or infection, unspecified: K85.90

## 2018-01-23 LAB — GLUCOSE, CAPILLARY
GLUCOSE-CAPILLARY: 100 mg/dL — AB (ref 65–99)
GLUCOSE-CAPILLARY: 114 mg/dL — AB (ref 65–99)
GLUCOSE-CAPILLARY: 93 mg/dL (ref 65–99)
GLUCOSE-CAPILLARY: 97 mg/dL (ref 65–99)
Glucose-Capillary: 113 mg/dL — ABNORMAL HIGH (ref 65–99)
Glucose-Capillary: 89 mg/dL (ref 65–99)

## 2018-01-23 LAB — PROCALCITONIN: Procalcitonin: 17.66 ng/mL

## 2018-01-23 LAB — BASIC METABOLIC PANEL
ANION GAP: 11 (ref 5–15)
BUN: 16 mg/dL (ref 6–20)
CHLORIDE: 100 mmol/L — AB (ref 101–111)
CO2: 32 mmol/L (ref 22–32)
Calcium: 8.2 mg/dL — ABNORMAL LOW (ref 8.9–10.3)
Creatinine, Ser: 0.75 mg/dL (ref 0.44–1.00)
GFR calc non Af Amer: >60 mL/min (ref >60)
Glucose, Bld: 112 mg/dL — ABNORMAL HIGH (ref 65–99)
POTASSIUM: 2.8 mmol/L — AB (ref 3.5–5.1)
SODIUM: 143 mmol/L (ref 135–145)

## 2018-01-23 LAB — CBC
HEMATOCRIT: 24.5 % — AB (ref 36.0–46.0)
Hemoglobin: 8.4 g/dL — ABNORMAL LOW (ref 12.0–15.0)
MCH: 37.5 pg — ABNORMAL HIGH (ref 26.0–34.0)
MCHC: 34.3 g/dL (ref 30.0–36.0)
MCV: 109.4 fL — AB (ref 78.0–100.0)
Platelets: 131 10*3/uL — ABNORMAL LOW (ref 150–400)
RBC: 2.24 MIL/uL — AB (ref 3.87–5.11)
RDW: 12.8 % (ref 11.5–15.5)
WBC: 22.8 10*3/uL — AB (ref 4.0–10.5)

## 2018-01-23 LAB — PHOSPHORUS: PHOSPHORUS: 3.9 mg/dL (ref 2.5–4.6)

## 2018-01-23 LAB — MAGNESIUM: Magnesium: 1.1 mg/dL — ABNORMAL LOW (ref 1.7–2.4)

## 2018-01-23 MED ORDER — POTASSIUM CHLORIDE 10 MEQ/50ML IV SOLN
10.0000 meq | INTRAVENOUS | Status: AC
  Administered 2018-01-23 (×6): 10 meq via INTRAVENOUS
  Filled 2018-01-23 (×6): qty 50

## 2018-01-23 MED ORDER — MAGNESIUM SULFATE 4 GM/100ML IV SOLN
4.0000 g | Freq: Once | INTRAVENOUS | Status: AC
  Administered 2018-01-23: 4 g via INTRAVENOUS
  Filled 2018-01-23: qty 100

## 2018-01-23 NOTE — Evaluation (Signed)
Physical Therapy Evaluation Patient Details Name: Donna Short MRN: [id number] DOB: 1991-03-14 Today's Date: 01/23/2018   History of Present Illness   with PMHx HTN, Asthma, Seizures and known ETOH abuse presented 5/25 with abdominal pain, nausea, vomiting. CTA/P shows pancreatitis. HTN and Tachycardic last ETOH was on 5/21, now s/p seizure, intubated 5/26-5/31/19.      Clinical Impression  Pt admitted with above diagnosis. Pt currently with functional limitations due to the deficits listed below (see PT Problem List). PTA, pt independent with all mobility, working at [employer]. Upon eval pt presents with lethargy and weakness, able to follow commands with increased time. Max A for rolling for pericare with nursing this visit. Attempted Stedy transfer however patient not tolerating mobility becoming tachy and heavy coughing. Currently lift dependent for transfers. Feel she will progress well with less lethargy and increased cognition.  Pt will benefit from skilled PT to increase their independence and safety with mobility to allow discharge to the venue listed below.       Follow Up Recommendations CIR;Supervision/Assistance - 24 hour    Equipment Recommendations  (TBD)    Recommendations for Other Services OT consult;Rehab consult     Precautions / Restrictions Precautions Precautions: Fall Restrictions Weight Bearing Restrictions: No      Mobility  Bed Mobility               General bed mobility comments: [facility]  Transfers                 General transfer comment: unable to at this time, pt limited by weakness and coughing at this visit.   Ambulation/Gait             General Gait Details: unable this visit  Stairs            Wheelchair Mobility    Modified Rankin (Stroke Patients Only)       Balance Overall balance assessment: (NT)                                           Pertinent  Vitals/Pain Pain Assessment: Faces Faces Pain Scale: Hurts even more Pain Location: abdomen Pain Descriptors / Indicators: Discomfort;Grimacing Pain Intervention(s): Limited activity within patient's tolerance;Monitored during session;Premedicated before session;Repositioned    Home Living Family/patient expects to be discharged to:: Inpatient rehab                      Prior Function Level of Independence: Independent         Comments: works at [employer] in [employer], drives, independnet with all mobility and ADLs     Hand Dominance        Extremity/Trunk Assessment   Upper Extremity Assessment Upper Extremity Assessment: Generalized weakness    Lower Extremity Assessment Lower Extremity Assessment: Generalized weakness(3-/5 BLE currently. )       Communication   Communication: (soft voice s/p intubuation )  Cognition Arousal/Alertness: Lethargic Behavior During Therapy: WFL for tasks assessed/performed                                   General Comments: following commands with increased time.       General Comments      Exercises General Exercises - Lower Extremity  Ankle Circles/Pumps: 10 reps Long Arc Quad: 10 reps   Assessment/Plan    PT Assessment Patient needs continued PT services  PT Problem List Decreased strength;Decreased range of motion;Decreased activity tolerance;Decreased balance;Decreased mobility;Decreased cognition;Pain       PT Treatment Interventions DME instruction;Gait training;Stair training;Functional mobility training;Therapeutic activities;Balance training;Therapeutic exercise    PT Goals (Current goals can be found in the Care Plan section)  Acute Rehab PT Goals Patient Stated Goal: walk  PT Goal Formulation: With patient Time For Goal Achievement: 02/06/18 Potential to Achieve Goals: Good    Frequency Min 3X/week   Barriers to discharge        Co-evaluation               AM-PAC PT  "6 Clicks" Daily Activity  Outcome Measure Difficulty turning over in bed (including adjusting bedclothes, sheets and blankets)?: Unable Difficulty moving from lying on back to sitting on the side of the bed? : Unable Difficulty sitting down on and standing up from a chair with arms (e.g., wheelchair, bedside commode, etc,.)?: Unable Help needed moving to and from a bed to chair (including a wheelchair)?: Total Help needed walking in hospital room?: Total Help needed climbing 3-5 steps with a railing? : Total 6 Click Score: 6    End of Session Equipment Utilized During Treatment: Oxygen Activity Tolerance: Patient limited by lethargy;Patient limited by fatigue Patient left: in chair;with family/visitor present;with nursing/sitter in room;with call bell/phone within reach Nurse Communication: Mobility status;Need for lift equipment PT Visit Diagnosis: Unsteadiness on feet (R26.81);Muscle weakness (generalized) (M62.81);Pain;Difficulty in walking, not elsewhere classified (R26.2) Pain - part of body: (trunk)    Time: 0981-1914 PT Time Calculation (min) (ACUTE ONLY): 45 min   Charges:   PT Evaluation $PT Eval Moderate Complexity: 1 Mod PT Treatments $Therapeutic Activity: 8-22 mins   PT G Codes:        Etta Grandchild, PT, DPT Acute Rehab Services Pager: [phone]    Etta Grandchild 01/23/2018, 5:27 PM

## 2018-01-23 NOTE — Progress Notes (Signed)
Inpatient Rehabilitation  Per PT request, patient was screened by Fae PippinMelissa Ladarrious Kirksey for appropriateness for an Inpatient Acute Rehab consult.  At this time we are planning to follow along for tolerance prior to requesting an Inpatient Rehab consult.  Call if questions    Charlane FerrettiMelissa Hermenia Fritcher, M.A., CCC/SLP Admission Coordinator  [facility]  Cell [phone]

## 2018-01-23 NOTE — Progress Notes (Signed)
Cortrak Tube Team Note:  RD paged due to tube port draining more fluid.   RD reinserted stylet and retraced tube. Tube appears to be in excellent position, though may have slight coil in stomach.   Output appears to be bilious in nature. Her previous abd xray showed potential mild SBO/ileus. Patient is unable to communicate if she is nauseated. She is distended, but RN says she has been distended throughout admission. RN notes she is having BMs. RD recommended holding TF until MD evaluates.  No apparent issues with tube at this time.   If the tube becomes dislodged please keep the tube and contact the Cortrak team at [email or web address] (password [email or web address]) for replacement.  If after hours and replacement cannot be delayed, place a NG tube and confirm placement with an abdominal x-ray.   Christophe LouisNathan Bertrand Vowels RD, LDN, CNSC Clinical Nutrition Available Tues-Sat via Pager: [phone] 01/23/2018 4:31 PM

## 2018-01-23 NOTE — Progress Notes (Signed)
.. ..  Name: Donna Short MRN: [id number] DOB: [birth year]    ADMISSION DATE:  01/16/2018 CONSULTATION DATE:  01/17/2018  REFERRING MD :  TRH   CHIEF COMPLAINT:  Seizures, Altered mental status  BRIEF PATIENT DESCRIPTION:  27 yr old female with PMHx HTN, Asthma, Seizures and known ETOH abuse   Presents 5/25 with abdominal pain, nausea, vomiting . CTA/P shows pancreatitis. HTN and Tachycardic last ETOH was on 5/21, now s/p seizure obtunded. Intubated.   STUDIES:  CT A/P 5/25 > 1. Findings indicative of acute pancreatitis. Pancreas appears edematous with peripancreatic fluid. There is fluid tracking throughout the anterolateral abdominal regions with mesenteric thickening in these areas felt to be due to pancreatitis. There is localized partially loculated ascites in the dependent portion of the pelvis. Wall thickening involving multiple loops of small and large bowel, likely due to enterocolitis secondary to inflammation from the peripancreatic fluid/pancreatitis. No bowel obstruction. No frank abscess in the abdomen or pelvis. Appendix appears Normal. No renal or ureteral calculus. No hydronephrosis. Question sludge in gallbladder. Gallbladder wall does not appear appreciably thickened. Hepatic steatosis.   CXR 5/26 >New left subclavian central venous line is well positioned, tip at the caval atrial junction. No pneumothorax. New left lung base opacity, most likely atelectasis.  Consider pneumonia if there are consistent clinical findings.  CULTURES: Blood 5/25 >NGTD Blood 5/26 > NGTD Urine 5/27 > NGTD Blood 5/30 >>   LINES/TUBES: ETT 5/26 >> L [place] CVC 5/26>> R Art Line  5/26 >  R EJ >5/25   ANTIBIOTICS: Zosyn 5/25 >>  SIGNIFICANT EVENTS: 5/25 > Presents to ED  5/26> Clinical worsening requiring CC consult > Intubation  5/26> Pressors initiated 5/26> Surgical Consult 5/26> Nephro consult 5/31  -Weaning this AM. Diuresis 4L 5/30  Tmax 102 overnight     SUBJECTIVE/OVERNIGHT/INTERVAL HX 6/1 - Extubated yesterday. got ativan for agitation last night. No seizures since admit. SIRS with tachypnea and sinus tachy contnihe - lopressor not helping. TMax 100.8 - did get tylenol x 1. Failed swallow 01/23/2018 - Speech  feeels this is due to mental status. Recommends  cortrak if mental status not improving. Sill with hypoactive delirium. High nursing demand per RN   VITAL SIGNS: Temp:  [98.1 F (36.7 C)-100.8 F (38.2 C)] 98.8 F (37.1 C) (06/01 0744) Pulse Rate:  [112-149] 112 (06/01 0900) Resp:  [20-38] 26 (06/01 0900) BP: (127-152)/(83-118) 145/111 (06/01 0900) SpO2:  [92 %-100 %] 100 % (06/01 0900) Arterial Line BP: (130-176)/(81-104) 160/104 (05/31 1400) FiO2 (%):  [40 %] 40 % (05/31 1117) Weight:  [62.4 kg (137 lb 9.1 oz)] 62.4 kg (137 lb 9.1 oz) (06/01 0400)  PHYSICAL EXAMINATION:  General Appearance:    Looks deconditiond and chronic critically ill. Obese +  Head:    Normocephalic, without obvious abnormality, atraumatic  Eyes:    PERRL - yes, conjunctiva/corneas - clear      Ears:    Normal external ear canals, both ears  Nose:   NG tube - no  Throat:  ETT TUBE - no , OG tube - no  Neck:   Supple,  No enlargement/tenderness/nodules     Lungs:     Clear to auscultation bilaterally, Tachycpnea +  Chest wall:    No deformity  Heart:    S1 and S2 normal, no murmur, CVP - no.  Pressors - no  Abdomen:     Soft, no masses, no organomegaly  Genitalia:    Not done  Rectal:  not done  Extremities:   Extremities- intact     Skin:   Intact in exposed areas .     Neurologic:   Sedation - none -> RASS - -1 to 0 . Moves all 4s - yes but weak moveement power 4/5. CAM-ICU - positive for delirum . Orientation - NA      PULMONARY Recent Labs  Lab 01/18/18 1430 01/19/18 0345 01/19/18 0605 01/20/18 0907 01/21/18 0406  PHART 7.348* 7.299* 7.347* 7.322* 7.325*  PCO2ART 44.4 45.0 39.7 42.8 43.0  PO2ART 101.0 75.0* 90.0 84.0 126.0*   HCO3 24.4 22.1 21.8 22.2 22.3  TCO2 _0 O2SAT 97.0 93.0 97.0 95.0 98.0    CBC Recent Labs  Lab 01/21/18 0336 01/22/18 0528 01/23/18 0537  HGB 8.8* 8.1* 8.4*  HCT 26.0* 24.4* 24.5*  WBC 18.8* 21.1* 22.8*  PLT 74* 101* 131*    COAGULATION Recent Labs  Lab 01/17/18 1226 01/18/18 1005  INR 1.36 1.12    CARDIAC   Recent Labs  Lab 01/17/18 1519 01/17/18 2048 01/18/18 0351  TROPONINI <0.03 0.22* 0.25*   No results for input(s): PROBNP in the last 168 hours.   CHEMISTRY Recent Labs  Lab 01/19/18 0329 01/19/18 1659 01/20/18 0401 01/21/18 0336 01/22/18 0528 01/23/18 0537  NA 138 138 139 142 145 143  K 4.1 3.6 4.0 3.8 3.4* 2.8*  CL 105 105 105 108 108 100*  CO2 _1 32  GLUCOSE 130* 116* 104* 136* 155* 112*  BUN 28* 27* 24* _2 CREATININE 0.98 0.86 0.89 0.83 0.89 0.75  CALCIUM 6.7* 7.0* 7.8* 8.5* 8.2* 8.2*  MG 2.0 2.1 2.2 1.9 1.2* 1.1*  PHOS 3.3 2.6  --  3.8 4.4 3.9   Estimated Creatinine Clearance: 90.1 mL/min (by C-G formula based on SCr of 0.75 mg/dL).   LIVER Recent Labs  Lab 01/17/18 1226  01/18/18 1005 01/18/18 1934 01/19/18 0329 01/20/18 0401 01/21/18 0336 01/22/18 0528  AST  --    < >  --  55* 50* 33 47* 71*  ALT  --    < >  --  _3 ALKPHOS  --    < >  --  22* 28* 35* 63 79  BILITOT  --    < >  --  0.9 0.9 1.0 1.1 0.7  PROT  --    < >  --  4.3* 4.7* 4.7* 5.3* 5.4*  ALBUMIN  --    < >  --  1.8* 1.8* 1.5* 1.6* 1.6*  INR 1.36  --  1.12  --   --   --   --   --    < > = values in this interval not displayed.     INFECTIOUS Recent Labs  Lab 01/19/18 0329 01/21/18 1501 01/21/18 1828 01/22/18 0528 01/23/18 0537  LATICACIDVEN 1.4 2.1* 2.3*  --   --   PROCALCITON 2.75 1.51  --  8.89 17.66     ENDOCRINE CBG (last 3)  Recent Labs    01/23/18 0027 01/23/18 0421 01/23/18 0742  GLUCAP 114* 113* 97         IMAGING x48h  - image(s) personally visualized  -   highlighted in bold Dg Chest  Port 1 View  Result Date: 01/22/2018 CLINICAL DATA:  27 year old female with acute pancreatitis. Pleural effusions and pneumonia. Intubated. EXAM: PORTABLE CHEST 1 VIEW COMPARISON:  01/21/2018 and earlier. FINDINGS: Portable AP semi upright  view at 0546 hours. Endotracheal tube tip in good position at the level the clavicles. Stable enteric tube, side hole up the level of the mid stomach. Stable left subclavian central line. Continued confluent in veiling opacity throughout the left lung, sparing the apex. Mediastinal contours remain normal. Decreased veiling opacity in the right lung, which now appears clear allowing for portable technique. No pneumothorax. Mild gaseous distension of the stomach but otherwise paucity of upper abdominal bowel gas. No acute osseous abnormality identified. IMPRESSION: 1. Stable lines and tubes. 2. Continued left pleural effusion with atelectasis and/or pneumonia. 3. Improved right lung ventilation, regressed right pleural effusion. 4. No new cardiopulmonary abnormality. Electronically Signed   By: Genevie Ann M.D.   On: 01/22/2018 08:51       ASSESSMENT / PLAN: NEURO: A: Acute encephalopathy- metabolic and post ictal Alcohol Withdrawal with seizure -CT head negative for acute intracranial process  01/23/2018 - hypoactive delirum continues . Did get agitated overnight needing ativan x 1. Off al sedation gtt post extubation yesterday  P: Dc ativan from [facility] If agitted use precedex or RN to call for judicious use of ativan RASS goal of 0  Continue catapres patch Continue Keppra.  Folic Acid/Thimaine   CARDIAC: A: Tachycardia Elevated troponin in setting of demand ischemia & renal failure H/O HTN -Levophed off 5/29 -Non-invasive Flowtrack initiated 5/26 -Echo > EF 55%, MV trivial regurg, RV systolic fxn mildly reduced, TV Mild regurg  01/23/2018 - remains off pressors. But ongoing sinus tachycardia that does not respond to lopressor  P: Monitor QTC MAP goal >  65 Continue Clonidine patch 0.1 mg weekly  PULMONARY: A: Acute hypoxic respiratory failure with progressive bilateral pulmonary infiltrates/edema and persistent left pleural effusion -Moderate pleural effusion noted on CT chest 5/28   01/23/18 - extubatd x 24h. On [place] o2 . Ongoing mild tachypnea  P: pulm toilet O2 fo pulse ox > 88%   ID: A: Acute Pancreatitis Leukocytosis   18.8 > 21.1 >> -Cultures remain negative, MRSA PCR negative  Febrile  -T-max 102  -PCT 8.89  Lactic acidosis > resolved  01/23/2018 - fever improving but PCT rising   P: Dc tlenol from [facility] - RN to call if temp > 102F  Continue zosyn for pancreatitis and concern for pneumonia on CXR (day 7) Trend WBC and Fever curve  Follow Culture Data   Endocrine: A: Hyperglycemia  P: Trend Glucose  SSI   GI: A: Acute Pancreatitis secondary to ETOH Has sludge in galbladder but Alk phos and transaminases not markedly elevated Abdominal distension CT abdomen 5/29 with stable peripancreatic fluid as well as free fluid in the abdomen, wall thickening within the colon and bilateral pleural effusion Hypoalbuminemia  Concern for abdominal compartment syndrome 5/27 > APP 78 ( Bladder pressure 17, MAP 95)  01/23/2018 - soft abdomoen. Minimal tenderness only. Bladder pressure monitoring dc'ed yesterday. Mild transaminitis. Faild swallow   P:  cortrak for tube feeds H2 blockade   Heme: A: Thrombocytopenia: stable.?Sequestration Bleeding gums 5/27 with mouth care Anemia>>Hemodilutional. PT/INR within normal Leukocytosis   P: Trend CBC's Avoid medications that may worsen Platelets Transfuse for hemoglobin <7 DVT PPx-> SCDs - PRBC for hgb </= 6.9gm%    - exceptions are   -  if ACS susepcted/confirmed then transfuse for hgb </= 8.0gm%,  or    -  active bleeding with hemodynamic instability, then transfuse regardless of hemoglobin value   At at all times try to transfuse 1 unit prbc as possible with  exception of active hemorrhage    RENAL A: Anion Gap metabolic Acidosis- resolved Acute Kidney injury > improving Hypoalbuminemia Hypocalcemia > resolved Hypokalemia: resolved Hypophos: resolved   01/23/18 - severe low K and low mag P: Replete mag and k  Trend BMP  Strict I & O  Avoid nephrotoxic medications and any contrast > all imaging without contrast Replace electrolytes as needed.  Lasix 40 meq x 2   Family member: Mother and Father updated at bedside 5/31. None at bedside 01/23/18  DISPO: keep In ICU one more day    The patient is critically ill with multiple organ systems failure and requires high complexity decision making for assessment and support, frequent evaluation and titration of therapies, application of advanced monitoring technologies and extensive interpretation of multiple databases.   Critical Care Time devoted to patient care services described in this note is  30  Minutes. This time reflects time of care of this signee Dr Brand Males. This critical care time does not reflect procedure time, or teaching time or supervisory time of PA/NP/Med student/Med Resident etc but could involve care discussion time    Dr. Brand Males, M.D., [facility].C.P Pulmonary and Critical Care Medicine Staff Physician [facility] Pager: [phone], If no answer or between  15:00h - 7:00h: call [phone]  01/23/2018 10:18 AM

## 2018-01-23 NOTE — Progress Notes (Addendum)
Cortrak Tube Team Note:  Consult received to place a Cortrak feeding tube.   A 10 F Cortrak tube was placed in the R nare and secured with a nasal bridle at 102 cm. Per the Cortrak monitor reading the tube tip is D3/D4, near LOT.   Given pancreatitis/postlyoric requirement, X-ray is required, abdominal x-ray has been ordered by the Cortrak team. Please confirm tube placement before using the Cortrak tube.   If the tube becomes dislodged please keep the tube and contact the Cortrak team at [email or web address] (password [email or web address]) for replacement.  If after hours and replacement cannot be delayed, place a NG tube and confirm placement with an abdominal x-ray.   Addendum: reviewed abd xray. RD felt it may be important to note that the flushes that had been instilled into tube during placement all came back up when tube was unplugged; flushes had not traveled distally in gut  ->would recommend holding tube feeds until evaluated by MD.   Christophe LouisNathan Caroll Weinheimer RD, LDN, CNSC Clinical Nutrition Available Tues-Sat via Pager: [phone] 01/23/2018 11:06 AM

## 2018-01-23 NOTE — Evaluation (Signed)
Clinical/Bedside Swallow Evaluation Patient Details  Name: Donna Short MRN: [id number] Date of Birth: July 09, 1991  Today's Date: 01/23/2018 Time: SLP Start Time (ACUTE ONLY): 0935 SLP Stop Time (ACUTE ONLY): 0950 SLP Time Calculation (min) (ACUTE ONLY): 15 min  Past Medical History:  Past Medical History:  Diagnosis Date  . Asthma   . Hypertension   . Seizures (HCC)    Past Surgical History:  Past Surgical History:  Procedure Laterality Date  . NO PAST SURGERIES     HPI:  27 yr old female with PMHx HTN, Asthma, Seizures and known ETOH abuse presented 5/25 with abdominal pain, nausea, vomiting. CTA/P shows pancreatitis. HTN and Tachycardic last ETOH was on 5/21, now s/p seizure, intubated 5/26-5/31/19.    Assessment / Plan / Recommendation Clinical Impression   Patient presents with moderate risk for aspiration due to altered mental status and 6 day intubation. Pt is alert but distractible, requiring cues to follow simple commands. Oral motor examination reveals symmetry, but reduced range of motion which I suspect is cognitive vs weakness. Voice is very low intensity, intermittently aphonic. Cough is weak; it is difficult to determine whether this is cognitive or related to intubation. With POs, there is oral holding and prolonged oral transit, anterior spillage with liquids bilaterally. Suspect delayed swallow initiation. At this time pt not ready for POs or instrumental testing; recommend she remain NPO. D/w MD who feels mentation not likely to improve for several days; plan is for cortrak. Prognosis for return to POs is good with improvements in mentation. Will follow up for readiness for POs vs instrumental testing.   SLP Visit Diagnosis: Dysphagia, unspecified (R13.10)    Aspiration Risk  Moderate aspiration risk    Diet Recommendation NPO   Medication Administration: Via alternative means    Other  Recommendations Oral Care Recommendations: Oral care QID   Follow up  Recommendations Other (comment)(tbd)      Frequency and Duration min 2x/week  2 weeks       Prognosis Prognosis for Safe Diet Advancement: Good Barriers to Reach Goals: Cognitive deficits      Swallow Study   General Date of Onset: 01/16/18 HPI: 27 yr old female with PMHx HTN, Asthma, Seizures and known ETOH abuse presented 5/25 with abdominal pain, nausea, vomiting. CTA/P shows pancreatitis. HTN and Tachycardic last ETOH was on 5/21, now s/p seizure, intubated 5/26-5/31/19.  Type of Study: Bedside Swallow Evaluation Previous Swallow Assessment: none on file Diet Prior to this Study: NPO Temperature Spikes Noted: Yes(100.8) Respiratory Status: Nasal cannula History of Recent Intubation: Yes Length of Intubations (days): 6 days Date extubated: 01/22/18 Behavior/Cognition: Alert;Distractible;Requires cueing Oral Cavity Assessment: Within Functional Limits Oral Care Completed by SLP: Recent completion by staff Oral Cavity - Dentition: Adequate natural dentition Vision: Impaired for self-feeding Self-Feeding Abilities: Total assist Patient Positioning: Upright in bed Baseline Vocal Quality: Low vocal intensity Volitional Cough: Weak Volitional Swallow: Unable to elicit    Oral/Motor/Sensory Function Overall Oral Motor/Sensory Function: Generalized oral weakness Facial ROM: Reduced right;Reduced left Facial Symmetry: Within Functional Limits Lingual ROM: Reduced right;Reduced left Lingual Symmetry: Within Functional Limits Mandible: Within Functional Limits   Ice Chips Ice chips: Impaired Oral Phase Impairments: Poor awareness of bolus Oral Phase Functional Implications: Oral holding;Prolonged oral transit Pharyngeal Phase Impairments: Suspected delayed Swallow   Thin Liquid Thin Liquid: Impaired Presentation: Cup;Straw Oral Phase Impairments: Reduced labial seal;Poor awareness of bolus Oral Phase Functional Implications: Prolonged oral transit;Oral holding Pharyngeal   Phase Impairments: Suspected delayed Swallow;Change in  Vital Signs    Nectar Thick Nectar Thick Liquid: Not tested   Honey Thick Honey Thick Liquid: Not tested   Puree Puree: Not tested   Solid   GO   Solid: Not tested       Rondel BatonMary Beth Sabatino [name], MS, CCC-SLP Speech-Language Pathologist [phone]  Arlana LindauMary E Gael Delude 01/23/2018,10:03 AM

## 2018-01-24 DIAGNOSIS — R5381 Other malaise: Secondary | ICD-10-CM

## 2018-01-24 DIAGNOSIS — J9690 Respiratory failure, unspecified, unspecified whether with hypoxia or hypercapnia: Secondary | ICD-10-CM

## 2018-01-24 DIAGNOSIS — N179 Acute kidney failure, unspecified: Secondary | ICD-10-CM

## 2018-01-24 DIAGNOSIS — J9600 Acute respiratory failure, unspecified whether with hypoxia or hypercapnia: Secondary | ICD-10-CM

## 2018-01-24 LAB — CBC WITH DIFFERENTIAL/PLATELET
Abs Immature Granulocytes: 0.7 10*3/uL — ABNORMAL HIGH (ref 0.0–0.1)
Basophils Absolute: 0.1 10*3/uL (ref 0.0–0.1)
Basophils Relative: 0 %
EOS PCT: 0 %
Eosinophils Absolute: 0.1 10*3/uL (ref 0.0–0.7)
HEMATOCRIT: 24.6 % — AB (ref 36.0–46.0)
HEMOGLOBIN: 8.3 g/dL — AB (ref 12.0–15.0)
Immature Granulocytes: 3 %
LYMPHS ABS: 1.4 10*3/uL (ref 0.7–4.0)
LYMPHS PCT: 6 %
MCH: 37.1 pg — ABNORMAL HIGH (ref 26.0–34.0)
MCHC: 33.7 g/dL (ref 30.0–36.0)
MCV: 109.8 fL — ABNORMAL HIGH (ref 78.0–100.0)
Monocytes Absolute: 1.6 10*3/uL — ABNORMAL HIGH (ref 0.1–1.0)
Monocytes Relative: 7 %
NEUTROS PCT: 84 %
Neutro Abs: 18.4 10*3/uL — ABNORMAL HIGH (ref 1.7–7.7)
Platelets: 189 10*3/uL (ref 150–400)
RBC: 2.24 MIL/uL — AB (ref 3.87–5.11)
RDW: 13.3 % (ref 11.5–15.5)
WBC: 22.3 10*3/uL — AB (ref 4.0–10.5)

## 2018-01-24 LAB — BASIC METABOLIC PANEL
ANION GAP: 12 (ref 5–15)
Anion gap: 10 (ref 5–15)
BUN: 18 mg/dL (ref 6–20)
BUN: 16 mg/dL (ref 6–20)
CHLORIDE: 104 mmol/L (ref 101–111)
CO2: 30 mmol/L (ref 22–32)
CO2: 29 mmol/L (ref 22–32)
CREATININE: 0.87 mg/dL (ref 0.44–1.00)
Calcium: 8.6 mg/dL — ABNORMAL LOW (ref 8.9–10.3)
Calcium: 8.5 mg/dL — ABNORMAL LOW (ref 8.9–10.3)
Chloride: 105 mmol/L (ref 101–111)
Creatinine, Ser: 0.78 mg/dL (ref 0.44–1.00)
GFR calc non Af Amer: >60 mL/min (ref >60)
Glucose, Bld: 111 mg/dL — ABNORMAL HIGH (ref 65–99)
Glucose, Bld: 108 mg/dL — ABNORMAL HIGH (ref 65–99)
POTASSIUM: 3.3 mmol/L — AB (ref 3.5–5.1)
Potassium: 3.8 mmol/L (ref 3.5–5.1)
SODIUM: 146 mmol/L — AB (ref 135–145)
SODIUM: 144 mmol/L (ref 135–145)

## 2018-01-24 LAB — MAGNESIUM: Magnesium: 2.3 mg/dL (ref 1.7–2.4)

## 2018-01-24 LAB — HEPATIC FUNCTION PANEL
ALT: 19 U/L (ref 14–54)
AST: 32 U/L (ref 15–41)
Albumin: 1.9 g/dL — ABNORMAL LOW (ref 3.5–5.0)
Alkaline Phosphatase: 72 U/L (ref 38–126)
BILIRUBIN DIRECT: 0.1 mg/dL (ref 0.1–0.5)
BILIRUBIN INDIRECT: 0.9 mg/dL (ref 0.3–0.9)
TOTAL PROTEIN: 6 g/dL — AB (ref 6.5–8.1)
Total Bilirubin: 1 mg/dL (ref 0.3–1.2)

## 2018-01-24 LAB — GLUCOSE, CAPILLARY
GLUCOSE-CAPILLARY: 77 mg/dL (ref 65–99)
GLUCOSE-CAPILLARY: 98 mg/dL (ref 65–99)
GLUCOSE-CAPILLARY: 85 mg/dL (ref 65–99)
GLUCOSE-CAPILLARY: 104 mg/dL — AB (ref 65–99)

## 2018-01-24 LAB — C DIFFICILE QUICK SCREEN W PCR REFLEX
C Diff antigen: POSITIVE — AB
C Diff toxin: NEGATIVE

## 2018-01-24 LAB — CLOSTRIDIUM DIFFICILE BY PCR, REFLEXED: CDIFFPCR: NEGATIVE

## 2018-01-24 LAB — PROCALCITONIN: Procalcitonin: 9.35 ng/mL

## 2018-01-24 LAB — VANCOMYCIN, TROUGH: VANCOMYCIN TR: 30 ug/mL — AB (ref 15–20)

## 2018-01-24 LAB — PHOSPHORUS: PHOSPHORUS: 4 mg/dL (ref 2.5–4.6)

## 2018-01-24 MED ORDER — POTASSIUM CHLORIDE 20 MEQ/15ML (10%) PO SOLN
40.0000 meq | Freq: Once | ORAL | Status: AC
  Administered 2018-01-24: 40 meq
  Filled 2018-01-24: qty 30

## 2018-01-24 MED ORDER — VANCOMYCIN HCL IN DEXTROSE 750-5 MG/150ML-% IV SOLN
750.0000 mg | Freq: Two times a day (BID) | INTRAVENOUS | Status: DC
Start: 2018-01-24 — End: 2018-01-26
  Administered 2018-01-24 – 2018-01-26 (×4): 750 mg via INTRAVENOUS
  Filled 2018-01-24 (×4): qty 150

## 2018-01-24 MED ORDER — VANCOMYCIN 50 MG/ML ORAL SOLUTION
125.0000 mg | Freq: Four times a day (QID) | ORAL | Status: DC
  Administered 2018-01-24 – 2018-01-26 (×7): 125 mg via ORAL
  Filled 2018-01-24 (×9): qty 2.5

## 2018-01-24 MED ORDER — FREE WATER
200.0000 mL | Status: DC
  Administered 2018-01-24 – 2018-01-25 (×8): 200 mL
  Administered 2018-01-26: 100 mL

## 2018-01-24 MED ORDER — FUROSEMIDE 10 MG/ML IJ SOLN
40.0000 mg | Freq: Once | INTRAMUSCULAR | Status: AC
  Administered 2018-01-24: 40 mg via INTRAVENOUS
  Filled 2018-01-24: qty 4

## 2018-01-24 MED ORDER — POTASSIUM CHLORIDE 10 MEQ/50ML IV SOLN
10.0000 meq | INTRAVENOUS | Status: AC
  Administered 2018-01-24 (×6): 10 meq via INTRAVENOUS
  Filled 2018-01-24 (×5): qty 50

## 2018-01-24 MED ORDER — LABETALOL HCL 200 MG PO TABS
200.0000 mg | ORAL_TABLET | Freq: Three times a day (TID) | ORAL | Status: DC
  Administered 2018-01-24 – 2018-01-26 (×7): 200 mg
  Filled 2018-01-24 (×7): qty 1

## 2018-01-24 NOTE — Progress Notes (Signed)
OT Cancellation Note  Patient Details Name: Donna CurlsShamika M Short MRN: [id number] DOB: 1991-08-09   Cancelled Treatment:    Reason Eval/Treat Not Completed: Fatigue/lethargy limiting ability to participate   Jeani HawkingWendi Cuong Moorman, OTR/L [phone]   Jeani HawkingConarpe, Aditya Nastasi M 01/24/2018, 4:49 PM

## 2018-01-24 NOTE — Progress Notes (Signed)
CRITICAL VALUE ALERT  Critical Value:  Vancomycin trough 30   Date & Time Notied: 01/24/2018 at 1025  Provider Notified: Ranell PatrickPete Babock, NP   Orders Received/Actions taken: stopped Vancomycin currently infusing.

## 2018-01-24 NOTE — Progress Notes (Signed)
Pharmacy Antibiotic Note  Romona CurlsShamika M Badon is a 27 y.o. female admitted on 01/16/2018 with pancreatitis.   Pharmacy has been consulted for Zosyn, Vancomycin dosing.  Cr stable 0.8, PCT trending down 17>9, WBC remains elevated 22 and Tm 99.6,  Vancomycin 750mg  q8  VT 6/2 30 drawn prior to dose given  Plan: Decrease vancomcyin 750mg  IV q12hr - start 10pm 6/2   Height: 5\' 2"  (157.5 cm) Weight: 136 lb 14.5 oz (62.1 kg) IBW/kg (Calculated) : 50.1  Temp (24hrs), Avg:99.1 F (37.3 C), Min:98.4 F (36.9 C), Max:99.6 F (37.6 C)  Recent Labs  Lab 01/18/18 0830  01/18/18 1934  01/19/18 0329  01/20/18 0401 01/21/18 0336 01/21/18 1501 01/21/18 1828 01/22/18 0528 01/23/18 0537 01/24/18 0000 01/24/18 0648 01/24/18 0914  WBC  --   --   --   --  6.8  --  8.0 18.8*  --   --  21.1* 22.8*  --  22.3*  --   CREATININE  --    < > 1.00   < > 0.98   < > 0.89 0.83  --   --  0.89 0.75 0.78  --   --   LATICACIDVEN 2.3*  --  1.8  --  1.4  --   --   --  2.1* 2.3*  --   --   --   --   --   VANCOTROUGH  --   --   --   --   --   --   --   --   --   --   --   --   --   --  30*   < > = values in this interval not displayed.    Estimated Creatinine Clearance: 89.9 mL/min (by C-G formula based on SCr of 0.78 mg/dL).    Allergies  Allergen Reactions  . Benadryl [Diphenhydramine Hcl (Sleep)] Rash  . Latex Rash  . Penicillins Itching and Rash    Has patient had a PCN reaction causing immediate rash, facial/tongue/throat swelling, SOB or lightheadedness with hypotension: Yes Has patient had a PCN reaction causing severe rash involving mucus membranes or skin necrosis: No Has patient had a PCN reaction that required hospitalization: Yes Has patient had a PCN reaction occurring within the last 10 years: No If all of the above answers are "NO", then may proceed with Cephalosporin use.;  . Shrimp [Shellfish Allergy] Hives and Rash    Antimicrobials this admission:  Zosyn 5/25> Vancomycin 5/31>  Dose  adjustments this admission: Vancomycin 7550mg  q8>q12h  Microbiology results:  5/25 BCx: NG 5/26 Bcx NGF 5/26 MRSA PCR Neg 5/27 Ucx: Neg 5/30 Bcx NGTD    Leota SauersLisa Junaid Wurzer Pharm.D. CPP, BCPS Clinical Pharmacist [phone] 01/24/2018 10:52 AM

## 2018-01-24 NOTE — Progress Notes (Signed)
eLink Physician-Brief Progress Note Patient Name: Romona CurlsShamika M Feldstein DOB: 01-12-1991 MRN: [id number]   Date of Service  01/24/2018  HPI/Events of Note  Hypokalemia  eICU Interventions  Potassium replaced     Intervention Category Intermediate Interventions: Electrolyte abnormality - evaluation and management  Malisha Mabey 01/24/2018, 3:05 AM

## 2018-01-24 NOTE — Progress Notes (Signed)
Physical Therapy Treatment Patient Details Name: NAOKO DIPERNA MRN: [id number] DOB: 02/28/1991 Today's Date: 01/24/2018    History of Present Illness  with PMHx HTN, Asthma, Seizures and known ETOH abuse presented 5/25 with abdominal pain, nausea, vomiting. CTA/P shows pancreatitis. HTN and Tachycardic last ETOH was on 5/21, now s/p seizure, intubated 5/26-5/31/19.      PT Comments    Continuing work on functional mobility and activity tolerance;  Much improved mentation over yesterday's session, and pt able to follow commands with increased time; Successful sit to stand with light mod assist, difficulty with dynamic shifts to step, requiring max assist of 2 to pivot to chair; Continue to recommend CIR -- noted Rehab Admissions Coordinator is following along  Follow Up Recommendations  CIR;Supervision/Assistance - 24 hour     Equipment Recommendations  Rolling walker with 5" wheels;3in1 (PT)    Recommendations for Other Services OT consult;Rehab consult     Precautions / Restrictions Precautions Precautions: Fall Restrictions Weight Bearing Restrictions: No    Mobility  Bed Mobility Overal bed mobility: Needs Assistance Bed Mobility: Rolling;Sidelying to Sit Rolling: Min assist Sidelying to sit: Mod assist;+2 for safety/equipment       General bed mobility comments: Good use of rails to roll; cues for intiation and to reach for rails; mod assist to push and elevate trunk to EOB  Transfers Overall transfer level: Needs assistance Equipment used: 2 person hand held assist Transfers: Sit to/from Stand;Stand Pivot Transfers Sit to Stand: +2 safety/equipment;Mod assist Stand pivot transfers: +2 safety/equipment;Max assist       General transfer comment: Light mod assist to power up to stand; close guard of knees; A lot of difficulty with weight shifts for stepping, and during pivot to chair, pt sunk into significant trunk and hip flexion; Max assist to shift to chair and  control descent  Ambulation/Gait             General Gait Details: attemtped taking steps during transfer   Stairs             Wheelchair Mobility    Modified Rankin (Stroke Patients Only)       Balance Overall balance assessment: Needs assistance Sitting-balance support: Feet supported;Bilateral upper extremity supported Sitting balance-Leahy Scale: Poor(approaching Fair)     Standing balance support: Bilateral upper extremity supported Standing balance-Leahy Scale: Zero Standing balance comment: Fatigued quickly, requiring +2 max assist to reamin standing with transition to [employer] Arousal/Alertness: Awake/alert Behavior During Therapy: [facility] for tasks assessed/performed;Restless Overall Cognitive Status: Impaired/Different from baseline Area of Impairment: Orientation;Attention;Following commands;Safety/judgement;Awareness;Problem solving                 Orientation Level: Disoriented to;Situation;Place;Time Current Attention Level: Sustained   Following Commands: Follows one step commands with increased time Safety/Judgement: Decreased awareness of safety;Decreased awareness of deficits Awareness: Intellectual Problem Solving: Difficulty sequencing;Requires verbal cues;Requires tactile cues General Comments: Easily distractible and tangential      Exercises      General Comments General comments (skin integrity, edema, etc.): Significantly high BP during session: 154/124 begin and 164/120 in chair; HR ranged 116-128; O2 sats remained greater than or equal to 92%      Pertinent Vitals/Pain Pain Assessment: Faces Faces Pain Scale: Hurts a little bit Pain Location: abdomen; nausea Pain Descriptors / Indicators: Discomfort;Grimacing Pain Intervention(s): Monitored during session  Home Living                      Prior Function            PT Goals (current goals can now be found in  the care plan section) Acute Rehab PT Goals Patient Stated Goal: walk  PT Goal Formulation: With patient Time For Goal Achievement: 02/06/18 Potential to Achieve Goals: Good Progress towards PT goals: Progressing toward goals    Frequency    Min 3X/week      PT Plan Current plan remains appropriate    Co-evaluation              AM-PAC PT "6 Clicks" Daily Activity  Outcome Measure  Difficulty turning over in bed (including adjusting bedclothes, sheets and blankets)?: A Lot Difficulty moving from lying on back to sitting on the side of the bed? : Unable Difficulty sitting down on and standing up from a chair with arms (e.g., wheelchair, bedside commode, etc,.)?: Unable Help needed moving to and from a bed to chair (including a wheelchair)?: Total Help needed walking in hospital room?: Total Help needed climbing 3-5 steps with a railing? : Total 6 Click Score: 7    End of Session Equipment Utilized During Treatment: Gait belt;Oxygen Activity Tolerance: Patient tolerated treatment well;Patient limited by fatigue Patient left: in chair;with call bell/phone within reach;with chair alarm set Nurse Communication: Mobility status;Need for lift equipment PT Visit Diagnosis: Unsteadiness on feet (R26.81);Muscle weakness (generalized) (M62.81);Pain;Difficulty in walking, not elsewhere classified (R26.2) Pain - part of body: (trunk)     Time: [id number] PT Time Calculation (min) (ACUTE ONLY): 23 min  Charges:  $Therapeutic Activity: 23-37 mins                    G Codes:       Van ClinesHolly Collan Schoenfeld, PT  Acute Rehabilitation Services Pager [phone] Office [phone]    Levi AlandHolly H Jaqualyn Juday 01/24/2018, 10:14 AM

## 2018-01-24 NOTE — Progress Notes (Addendum)
.. ..  Name: Donna Short MRN: [id number] DOB: May 02, 1991    ADMISSION DATE:  01/16/2018 CONSULTATION DATE:  01/17/2018  REFERRING MD :  TRH   CHIEF COMPLAINT:  Seizures, Altered mental status  BRIEF PATIENT DESCRIPTION:  27 yr old female with PMHx HTN, Asthma, Seizures and known ETOH abuse   Presents 5/25 with abdominal pain, nausea, vomiting . CTA/P shows pancreatitis. HTN and Tachycardic last ETOH was on 5/21, now s/p seizure obtunded. Intubated.   STUDIES:  CT A/P 5/25 > 1. Findings indicative of acute pancreatitis. Pancreas appears edematous with peripancreatic fluid. There is fluid tracking throughout the anterolateral abdominal regions with mesenteric thickening in these areas felt to be due to pancreatitis. There is localized partially loculated ascites in the dependent portion of the pelvis. Wall thickening involving multiple loops of small and large bowel, likely due to enterocolitis secondary to inflammation from the peripancreatic fluid/pancreatitis. No bowel obstruction. No frank abscess in the abdomen or pelvis. Appendix appears Normal. No renal or ureteral calculus. No hydronephrosis. Question sludge in gallbladder. Gallbladder wall does not appear appreciably thickened. Hepatic steatosis.   CULTURES: Blood 5/25 >NGTD Blood 5/26 > NGTD Urine 5/27 > NGTD Blood 5/30 >>  C. difficile 6/2>>>  LINES/TUBES: ETT 5/26 >> 5/31 L Rhodell CVC 5/26>> R Art Line  5/26 > 5/31 R EJ >5/25   ANTIBIOTICS: Zosyn 5/25 >> Vancomycin 5/31>>>  SIGNIFICANT EVENTS: 5/25 > Presents to ED  5/26> Clinical worsening requiring CC consult > Intubation  5/26> Pressors initiated 5/26> Surgical Consult 5/26> Nephro consult 5/31  -Weaning this AM. Diuresis 4L 5/30  Tmax 102 overnight    SUBJECTIVE/OVERNIGHT/INTERVAL HX Improving dramatically compared to with 6/1.  More awake, speech much more clear.  Stronger.  Asking for water.  Hemodynamically stable however remains  tachycardic.  WBC still high.  VITAL SIGNS: Temp:  [98.4 F (36.9 C)-99.6 F (37.6 C)] 98.6 F (37 C) (06/02 0738) Pulse Rate:  [115-131] 125 (06/02 0600) Resp:  [23-37] 27 (06/02 0600) BP: (130-159)/(102-127) 155/125 (06/02 0600) SpO2:  [94 %-100 %] 100 % (06/02 0600) Weight:  [136 lb 14.5 oz (62.1 kg)] 136 lb 14.5 oz (62.1 kg) (06/02 0355)  PHYSICAL EXAMINATION: General: 27 year old African-American femalemale resting comfortably in the bed no acute distress HEENT normocephalic atraumatic core track is in her right nare is no jugular venous distention mucous membranes moist Pulmonary: Diminished in the left greater than right base, no accessory muscle use. Cardiac: Tachycardic regular rate and rhythm without murmur rub or gallop Abdomen: Slightly distended, soft, a little tender to palpation positive bowel sounds, frequent bowel movements GU: Clear yellow Neuro: Awake, still confused but more oriented today moving all extremities strength improved Extremities: Warm and dry no significant edema  PULMONARY Recent Labs  Lab 01/18/18 1430 01/19/18 0345 01/19/18 0605 01/20/18 0907 01/21/18 0406  PHART 7.348* 7.299* 7.347* 7.322* 7.325*  PCO2ART 44.4 45.0 39.7 42.8 43.0  PO2ART 101.0 75.0* 90.0 84.0 126.0*  HCO3 24.4 22.1 21.8 22.2 22.3  TCO2 26 23 23 24 24   O2SAT 97.0 93.0 97.0 95.0 98.0    CBC Recent Labs  Lab 01/22/18 0528 01/23/18 0537 01/24/18 0648  HGB 8.1* 8.4* 8.3*  HCT 24.4* 24.5* 24.6*  WBC 21.1* 22.8* 22.3*  PLT 101* 131* 189    COAGULATION Recent Labs  Lab 01/17/18 1226 01/18/18 1005  INR 1.36 1.12    CARDIAC   Recent Labs  Lab 01/17/18 1519 01/17/18 2048 01/18/18 0351  TROPONINI <0.03 0.22* 0.25*  No results for input(s): PROBNP in the last 168 hours.   CHEMISTRY Recent Labs  Lab 01/19/18 1659 01/20/18 0401 01/21/18 0336 01/22/18 0528 01/23/18 0537 01/24/18 0000 01/24/18 0648  NA 138 139 142 145 143 146*  --   K 3.6 4.0 3.8 3.4*  2.8* 3.3*  --   CL 105 105 108 108 100* 104  --   CO2 23 23 24 29  32 30  --   GLUCOSE 116* 104* 136* 155* 112* 111*  --   BUN 27* 24* 20 17 16 18   --   CREATININE 0.86 0.89 0.83 0.89 0.75 0.78  --   CALCIUM 7.0* 7.8* 8.5* 8.2* 8.2* 8.6*  --   MG 2.1 2.2 1.9 1.2* 1.1*  --  2.3  PHOS 2.6  --  3.8 4.4 3.9  --  4.0   Estimated Creatinine Clearance: 89.9 mL/min (by C-G formula based on SCr of 0.78 mg/dL).   LIVER Recent Labs  Lab 01/17/18 1226  01/18/18 1005  01/19/18 0329 01/20/18 0401 01/21/18 0336 01/22/18 0528 01/24/18 0648  AST  --    < >  --    < > 50* 33 47* 71* 32  ALT  --    < >  --    < > 21 18 19 22 19   ALKPHOS  --    < >  --    < > 28* 35* 63 79 72  BILITOT  --    < >  --    < > 0.9 1.0 1.1 0.7 1.0  PROT  --    < >  --    < > 4.7* 4.7* 5.3* 5.4* 6.0*  ALBUMIN  --    < >  --    < > 1.8* 1.5* 1.6* 1.6* 1.9*  INR 1.36  --  1.12  --   --   --   --   --   --    < > = values in this interval not displayed.     INFECTIOUS Recent Labs  Lab 01/19/18 0329 01/21/18 1501 01/21/18 1828 01/22/18 0528 01/23/18 0537 01/24/18 0648  LATICACIDVEN 1.4 2.1* 2.3*  --   --   --   PROCALCITON 2.75 1.51  --  8.89 17.66 9.35     ENDOCRINE CBG (last 3)  Recent Labs    01/23/18 1958 01/23/18 2322 01/24/18 0339  GLUCAP 93 77 98         IMAGING x48h  - image(s) personally visualized  -   highlighted in bold Dg Abd Portable 1v  Result Date: 01/23/2018 CLINICAL DATA:  Evaluate NG tube placement EXAM: PORTABLE ABDOMEN - 1 VIEW COMPARISON:  CT scan Jan 20, 2018 FINDINGS: The feeding tube terminates in the left abdomen, likely in the proximal jejunum. Air-filled mildly prominent loops of small bowel in the abdomen measure up to 3.4 cm. IMPRESSION: 1. The feeding tube is in good position. 2. Air-filled mildly prominent/dilated loops of small bowel. This could represent developing ileus or small-bowel obstruction. Electronically Signed   By: Dorise Bullion III M.D   On: 01/23/2018  11:27       ASSESSMENT / PLAN:  27 year old female patient admitted with alcohol related pancreatitis.  Course complicated by DTs, seizure secondary to alcohol withdrawal, respiratory failure with pneumonia, septic shock and mods.  She is now successfully extubated.  Her delirium is improving.  Her strength is improving.  Pancreatitis seems to have resolved as lipase and LFTs normal.  Active  issues at this point include: Abdominal distention/discomfort with frequent diarrhea which in the setting of ongoing leukocytosis and extensive antibiotic exposure raise concern for C. difficile infection.  Also resolving delirium, hypertension, and ongoing left pleural effusion.  Her delirium is remarkably improved so we will continue the current therapy which is supportive in nature at this point.  For the hypertension we will add labetalol and diuresis.  And for the pleural effusion we will continue the diuresis as well as repeat a chest x-ray.  If the effusion continues it may warrant diagnostic thoracentesis however I suspect ongoing leukocytosis may actually be related to her abdomen.  She is stable for transfer to medicine service and stepdown unit setting  Resolved issues Anion gap metabolic acidosis AKI   Active issues:  Acute encephalopathy- metabolic and post ictal Alcohol Withdrawal with seizure Hypoactive delirium -CT head negative for acute intracranial process -Benzodiazepines discontinued -She is much better today Plan Continuing Catapres patch Continuing Keppra Folic acid and thiamine Supportive care   Tachycardia and ongoing hypertension Elevated troponin in setting of demand ischemia & renal failure -Levophed off 5/29 -Echo > EF 55%, MV trivial regurg, RV systolic fxn mildly reduced, TV Mild regurg 6/2 remains mildly tachycardic with significant hypertension Plan Continue clonidine patch  We will start labetalol Continue telemetry monitoring Lasix x1  Acute hypoxic  respiratory failure with progressive bilateral pulmonary infiltrates/edema and persistent left pleural effusion and possible hcap -Moderate pleural effusion noted on CT chest 5/28 -Extubated 5/31 Last chest x-ray on 5/31 demonstrated improved aeration on the right however large left effusion -Still 9.5 L positive -Procalcitonin slowly improving Plan Mobilize Lasix Wean oxygen N.p.o. Status Day #9 Zosyn, and 3 vancomycin; as procalcitonin is improving but not normalized we will continue current therapy If effusion persists may need to consider thoracentesis.  We will repeat chest x-ray in a.m.  Ongoing diarrhea Has not been on stool softeners, and has had tube feeds held for over 24 hours.  She has had extensive antibiotic exposure and ongoing leukocytosis she is at risk for C. difficile infection which would explain her ongoing leukocytosis Plan Check C. Difficile  Acute Pancreatitis secondary to ETOH Has sludge in galbladder but Alk phos and transaminases not markedly elevated CT abdomen 5/29 with stable peripancreatic fluid as well as free fluid in the abdomen, wall thickening within the colon and bilateral pleural effusion 5/27 > APP 78 ( Bladder pressure 17)  5/31: abd softer. D/c'd abd pressure monitoring. Lipase normal 6/1: abd soft no sig pain 6/2 nml LFTs Plan Avoid alcohol  Dysphagia w/ protein calorie malnutrition following critical illness.  Possible ileus 6/1 -Failed bedside swallow on 6/1 -Tube feeds held  on 6/1 due to abdominal film questioning possible small bowel obstruction versus ileus -6/2 abdomen softer Plan Has core track currently SLP to see again today, likely can start diet if continuing to fail swallow eval will start trickle feeds today  Fluid and electrolyte imbalance: hypernatremia and hypokalemia Plan Replace free water Replace K Repeat Chem in am   Hyperglycemia Excellent glycemic control Plan Sliding scale insulin  Anemia of critical  illness.  Felt also component of hemodilution.  hgb stable. No further evidence of bleeding Plan Trend CBC Transfuse for Hgb < 7   Thrombocytopenia  -Improving Plan Trend cbc If cont to improve we could consider pharmacological prophylaxis in next 24-48 hrs Cont SCDs for now   Family member: Mother and Father updated at bedside 5/31. None at bedside 01/23/18  DVT prophylaxis:  SCDs SUP: H2B-->stopped Diet: tubefeed Activity: OOB Disposition : Stepdown unit   My critical care x35 minutes Erick Colace ACNP-BC [place] Pager # [phone] OR # [phone] if no answer    01/24/2018 9:45 AM

## 2018-01-24 NOTE — Progress Notes (Signed)
  Speech Language Pathology Treatment: Dysphagia  Patient Details Name: Donna Short MRN: [id number] DOB: [birth year] Today's Date: 01/24/2018 Time: 1645-1700 SLP Time Calculation (min) (ACUTE ONLY): 15 min  Assessment / Plan / Recommendation Clinical Impression  Mentation greatly improved today. Pt alert and communicating verbally, following commands. Voice is clear but low intensity. She has been requesting water. Now with cortrak in place; RN reports some reflux via tube. Tolerates ice chips, small cup sips of water without overt signs of aspiration. Immediate cough with larger straw sip x1. Bolus formation is prolonged with puree. Delayed cough x1 with expectoration of small amount of frothy clear secretions, suctioned orally. No coughing with subsequent cup sips of water. As SLP completed PO trials, RN administered meds via cortrak, subsequently pt with delayed coughing and expectoration of thick clear yellow secretions. Would allow pt to have sips of thin water, ice chips after oral care, otherwise continue NPO. Consider GI consult.    HPI HPI: 27 yr old female with PMHx HTN, Asthma, Seizures and known ETOH abuse presented 5/25 with abdominal pain, nausea, vomiting. CTA/P shows pancreatitis. HTN and Tachycardic last ETOH was on 5/21, now s/p seizure, intubated 5/26-5/31/19.       SLP Plan  Continue with current plan of care       Recommendations  Diet recommendations: Other(comment)(sips of thin water, ice chips after oral care) Liquids provided via: Cup;No straw Medication Administration: Via alternative means Supervision: Full supervision/cueing for compensatory strategies Compensations: Slow rate;Small sips/bites Postural Changes and/or Swallow Maneuvers: Seated upright 90 degrees;Upright 30-60 min after meal                Oral Care Recommendations: Oral care QID;Oral care prior to ice chip/H20 Follow up Recommendations: Other (comment)(tbd) SLP Visit Diagnosis:  Dysphagia, unspecified (R13.10) Plan: Continue with current plan of care       GO               Rondel BatonMary Beth Tylan Briguglio, MS, CCC-SLP Speech-Language Pathologist [phone]  Donna Short 01/24/2018, 5:13 PM

## 2018-01-25 ENCOUNTER — Inpatient Hospital Stay (HOSPITAL_COMMUNITY): Payer: Managed Care, Other (non HMO)

## 2018-01-25 DIAGNOSIS — R0682 Tachypnea, not elsewhere classified: Secondary | ICD-10-CM

## 2018-01-25 DIAGNOSIS — R569 Unspecified convulsions: Secondary | ICD-10-CM

## 2018-01-25 DIAGNOSIS — F1010 Alcohol abuse, uncomplicated: Secondary | ICD-10-CM

## 2018-01-25 DIAGNOSIS — J45909 Unspecified asthma, uncomplicated: Secondary | ICD-10-CM

## 2018-01-25 DIAGNOSIS — Z720 Tobacco use: Secondary | ICD-10-CM

## 2018-01-25 DIAGNOSIS — J90 Pleural effusion, not elsewhere classified: Secondary | ICD-10-CM

## 2018-01-25 DIAGNOSIS — R000 Tachycardia, unspecified: Secondary | ICD-10-CM

## 2018-01-25 DIAGNOSIS — I10 Essential (primary) hypertension: Secondary | ICD-10-CM

## 2018-01-25 DIAGNOSIS — K859 Acute pancreatitis without necrosis or infection, unspecified: Secondary | ICD-10-CM

## 2018-01-25 DIAGNOSIS — D62 Acute posthemorrhagic anemia: Secondary | ICD-10-CM

## 2018-01-25 DIAGNOSIS — D72829 Elevated white blood cell count, unspecified: Secondary | ICD-10-CM

## 2018-01-25 LAB — COMPREHENSIVE METABOLIC PANEL
ALBUMIN: 2 g/dL — AB (ref 3.5–5.0)
ALT: 16 U/L (ref 14–54)
AST: 34 U/L (ref 15–41)
Alkaline Phosphatase: 53 U/L (ref 38–126)
Anion gap: 9 (ref 5–15)
BUN: 17 mg/dL (ref 6–20)
CHLORIDE: 106 mmol/L (ref 101–111)
CO2: 29 mmol/L (ref 22–32)
CREATININE: 0.99 mg/dL (ref 0.44–1.00)
Calcium: 8.6 mg/dL — ABNORMAL LOW (ref 8.9–10.3)
GFR calc Af Amer: >60 mL/min (ref >60)
Glucose, Bld: 96 mg/dL (ref 65–99)
POTASSIUM: 3.7 mmol/L (ref 3.5–5.1)
Sodium: 144 mmol/L (ref 135–145)
Total Bilirubin: 0.7 mg/dL (ref 0.3–1.2)
Total Protein: 5.9 g/dL — ABNORMAL LOW (ref 6.5–8.1)

## 2018-01-25 LAB — CBC WITH DIFFERENTIAL/PLATELET
BASOS PCT: 0 %
Basophils Absolute: 0 10*3/uL (ref 0.0–0.1)
EOS ABS: 0.2 10*3/uL (ref 0.0–0.7)
EOS PCT: 1 %
HEMATOCRIT: 22.5 % — AB (ref 36.0–46.0)
Hemoglobin: 7.5 g/dL — ABNORMAL LOW (ref 12.0–15.0)
LYMPHS PCT: 8 %
Lymphs Abs: 1.7 10*3/uL (ref 0.7–4.0)
MCH: 37.5 pg — ABNORMAL HIGH (ref 26.0–34.0)
MCHC: 33.3 g/dL (ref 30.0–36.0)
MCV: 112.5 fL — ABNORMAL HIGH (ref 78.0–100.0)
Monocytes Absolute: 1.4 10*3/uL — ABNORMAL HIGH (ref 0.1–1.0)
Monocytes Relative: 7 %
NEUTROS PCT: 84 %
Neutro Abs: 17.4 10*3/uL — ABNORMAL HIGH (ref 1.7–7.7)
Platelets: 244 10*3/uL (ref 150–400)
RBC: 2 MIL/uL — AB (ref 3.87–5.11)
RDW: 13.4 % (ref 11.5–15.5)
WBC: 20.7 10*3/uL — AB (ref 4.0–10.5)

## 2018-01-25 LAB — GLUCOSE, CAPILLARY
Glucose-Capillary: 123 mg/dL — ABNORMAL HIGH (ref 65–99)
Glucose-Capillary: 98 mg/dL (ref 65–99)

## 2018-01-25 LAB — PHOSPHORUS: PHOSPHORUS: 4.8 mg/dL — AB (ref 2.5–4.6)

## 2018-01-25 LAB — MAGNESIUM: Magnesium: 1.9 mg/dL (ref 1.7–2.4)

## 2018-01-25 MED ORDER — ENSURE ENLIVE PO LIQD
237.0000 mL | Freq: Three times a day (TID) | ORAL | Status: DC
  Administered 2018-01-26 – 2018-01-29 (×7): 237 mL via ORAL

## 2018-01-25 MED ORDER — ENOXAPARIN SODIUM 40 MG/0.4ML ~~LOC~~ SOLN
40.0000 mg | SUBCUTANEOUS | Status: DC
  Administered 2018-01-25 – 2018-01-29 (×5): 40 mg via SUBCUTANEOUS
  Filled 2018-01-25 (×5): qty 0.4

## 2018-01-25 MED ORDER — VITAMIN B-1 100 MG PO TABS
100.0000 mg | ORAL_TABLET | Freq: Every day | ORAL | Status: DC
  Administered 2018-01-26: 100 mg
  Filled 2018-01-25: qty 1

## 2018-01-25 MED ORDER — LEVETIRACETAM 100 MG/ML PO SOLN
500.0000 mg | Freq: Two times a day (BID) | ORAL | Status: DC
  Administered 2018-01-25 – 2018-01-26 (×2): 500 mg
  Filled 2018-01-25 (×3): qty 5

## 2018-01-25 MED ORDER — FOLIC ACID 1 MG PO TABS
1.0000 mg | ORAL_TABLET | Freq: Every day | ORAL | Status: DC
  Administered 2018-01-26: 1 mg
  Filled 2018-01-25: qty 1

## 2018-01-25 MED ORDER — TRAMADOL HCL 50 MG PO TABS
50.0000 mg | ORAL_TABLET | Freq: Four times a day (QID) | ORAL | Status: DC | PRN
  Administered 2018-01-27 (×2): 50 mg via ORAL
  Filled 2018-01-25 (×2): qty 1

## 2018-01-25 MED ORDER — ACETAMINOPHEN 325 MG PO TABS: 650.0000 mg | ORAL_TABLET | Freq: Four times a day (QID) | ORAL | Status: DC | PRN

## 2018-01-25 NOTE — Evaluation (Signed)
Occupational Therapy Evaluation Patient Details Name: Donna Short MRN: [id number] DOB: 1990-12-23 Today's Date: 01/25/2018    History of Present Illness 27 yr old female with PMHx HTN, Asthma, Seizures and known ETOH abuse presented 5/25 with abdominal pain, nausea, vomiting. CTA/P shows pancreatitis. HTN and Tachycardic last ETOH was on 5/21, now s/p seizure, intubated 5/26-5/31/19.    Clinical Impression   PTA, pt was living alone at a second floor apartment and was independent with ADLs, driving, and working at [employer]. Pt reporting her mother can stay at dc. Pt currently requiring Min Guard-Min A for UB ADLs, Min A +2 for LB ADLs, and Min A +2 for transfers. Pt presenting with decreased strength, balance, cognition, activity tolerance, and functional performance. Pt will require further acute OT to increase safety and independence with ADLs and functional mobility. Due to pt's age and functional change, recommend dc to CIR for intensive OT to optimize safety, independence with ADLs/IADLs, and return to PLOF.     Follow Up Recommendations  CIR;Supervision/Assistance - 24 hour    Equipment Recommendations  Other (comment)(Defer to next venue)    Recommendations for Other Services Rehab consult;PT consult     Precautions / Restrictions Precautions Precautions: Fall Restrictions Weight Bearing Restrictions: No      Mobility Bed Mobility Overal bed mobility: Needs Assistance Bed Mobility: Supine to Sit     Supine to sit: Min guard     General bed mobility comments: MIn Guard A for safety.   Transfers Overall transfer level: Needs assistance Equipment used: 2 person hand held assist Transfers: Sit to/from [employer] Sit to Stand: Min assist;+2 physical assistance Stand pivot transfers: Min assist;+2 physical assistance       General transfer comment: Min A +2 for balance during sit<>Stand. Requiring +2 for stand pivot and demonstrating  decreased balance and body awareness    Balance Overall balance assessment: Needs assistance Sitting-balance support: Feet supported;Bilateral upper extremity supported Sitting balance-Leahy Scale: Good Sitting balance - Comments: Bring ankles to knees for donning socks at EOB without LOB   Standing balance support: Bilateral upper extremity supported Standing balance-Leahy Scale: Poor Standing balance comment: Reliant on UE support                           ADL either performed or assessed with clinical judgement   ADL Overall ADL's : Needs assistance/impaired Eating/Feeding: NPO   Grooming: Sitting;Supervision/safety;Set up   Upper Body Bathing: Min guard;Sitting   Lower Body Bathing: Min guard;Sit to/from stand   Upper Body Dressing : Sitting;Minimal assistance Upper Body Dressing Details (indicate cue type and reason): Min A to don new gown Lower Body Dressing: Minimal assistance;+2 for physical assistance;Sit to/from stand Lower Body Dressing Details (indicate cue type and reason): Pt donned socks at EOB with MIn Guard A for safety and increased time. Pt requiring increased time to problem solving safe sequencing of socks as well as increased effort.  Toilet Transfer: Minimal assistance;+2 for physical assistance;Stand-pivot(Simulated recliner)           Functional mobility during ADLs: Minimal assistance;+2 for physical assistance(stand pivot to recliner) General ADL Comments: Pt requiring increased time and effort to perform ADLs. Requiring MIn Guard-Min A for safety and stability. Pt presenting with decreased activity tolerance and requiring increased effort for BADLs     Vision         Perception     Praxis      Pertinent  Vitals/Pain Pain Assessment: Faces Faces Pain Scale: Hurts a little bit Pain Location: abdomen Pain Descriptors / Indicators: Discomfort Pain Intervention(s): Monitored during session;Repositioned     Hand Dominance Right    Extremity/Trunk Assessment Upper Extremity Assessment Upper Extremity Assessment: Generalized weakness   Lower Extremity Assessment Lower Extremity Assessment: Generalized weakness   Cervical / Trunk Assessment Cervical / Trunk Assessment: Other exceptions Cervical / Trunk Exceptions: Abdominal pain   Communication Communication Communication: Other (comment)(Soft spoken)   Cognition Arousal/Alertness: Awake/alert Behavior During Therapy: WFL for tasks assessed/performed;Restless Overall Cognitive Status: Impaired/Different from baseline Area of Impairment: Attention;Following commands;Safety/judgement;Awareness;Problem solving                   Current Attention Level: Sustained   Following Commands: Follows one step commands with increased time Safety/Judgement: Decreased awareness of safety;Decreased awareness of deficits Awareness: Emergent Problem Solving: Difficulty sequencing;Requires verbal cues;Requires tactile cues General Comments: Pt following simple commands. Answering questions about home set up and PLOF. Requiring increased time for problem solving and processing during ADLs.    General Comments  HR 120.RR 23-29. BP before activity 150/115. BP after activity 125/99. SpO2 in 90s on RA; RN notified    Exercises     Shoulder Instructions      Home Living Family/patient expects to be discharged to:: Private residence Living Arrangements: Alone Available Help at Discharge: Family;Available 24 hours/day(Mother able to stay at dc per pt) Type of Home: Apartment(Second floor) Home Access: Stairs to enter Entrance Stairs-Number of Steps: 14; flight   Home Layout: One level     Bathroom Shower/Tub: IT trainerTub/shower unit;Curtain   Bathroom Toilet: Standard     Home Equipment: None          Prior Functioning/Environment Level of Independence: Independent        Comments: works at [employer] in [employer], drives, independnet with all mobility and  ADLs        OT Problem List: Decreased strength;Decreased range of motion;Decreased activity tolerance;Impaired balance (sitting and/or standing);Decreased cognition;Decreased safety awareness;Decreased knowledge of precautions;Decreased knowledge of use of DME or AE;Pain      OT Treatment/Interventions: Self-care/ADL training;Therapeutic exercise;Energy conservation;DME and/or AE instruction;Therapeutic activities;Patient/family education;Cognitive remediation/compensation    OT Goals(Current goals can be found in the care plan section) Acute Rehab OT Goals Patient Stated Goal: Return to PLOF OT Goal Formulation: With patient Time For Goal Achievement: 02/08/18 Potential to Achieve Goals: Good ADL Goals Pt Will Perform Grooming: with modified independence;standing Pt Will Perform Upper Body Bathing: with modified independence;standing Pt Will Perform Lower Body Bathing: with modified independence;sit to/from stand Pt Will Transfer to Toilet: with modified independence;regular height toilet;ambulating Pt Will Perform Toileting - Clothing Manipulation and hygiene: with modified independence;sit to/from stand  OT Frequency: Min 2X/week   Barriers to D/C:            Co-evaluation              AM-PAC PT "6 Clicks" Daily Activity     Outcome Measure Help from another person eating meals?: A Little Help from another person taking care of personal grooming?: A Little Help from another person toileting, which includes using toliet, bedpan, or urinal?: A Little Help from another person bathing (including washing, rinsing, drying)?: A Little Help from another person to put on and taking off regular upper body clothing?: A Little Help from another person to put on and taking off regular lower body clothing?: A Little 6 Click Score: 18   End of Session  Equipment Utilized During Treatment: Stage manager Communication: Mobility status  Activity Tolerance: Patient tolerated  treatment well;Patient limited by fatigue Patient left: in chair;with call bell/phone within reach;with chair alarm set  OT Visit Diagnosis: Unsteadiness on feet (R26.81);Other abnormalities of gait and mobility (R26.89);Muscle weakness (generalized) (M62.81);Other symptoms and signs involving cognitive function;Pain Pain - part of body: (Abdomen)                Time: 0937-1000 OT Time Calculation (min): 23 min Charges:  OT General Charges $OT Visit: 1 Visit OT Evaluation $OT Eval Moderate Complexity: 1 Mod OT Treatments $Self Care/Home Management : 8-22 mins G-Codes:     Tangela Dolliver MSOT, OTR/L Acute Rehab Pager: [phone] Office: [phone]  Theodoro Grist Adonijah Baena 01/25/2018, 12:33 PM

## 2018-01-25 NOTE — Progress Notes (Signed)
  Speech Language Pathology Treatment: Dysphagia  Patient Details Name: Donna Short MRN: [id number] DOB: Feb 11, 1991 Today's Date: 01/25/2018 Time: [id number] SLP Time Calculation (min) (ACUTE ONLY): 13 min  Assessment / Plan / Recommendation Clinical Impression  Pt consumed thin liquids and purees without expectorating secretions or showing signs of regurgitation, although she is hesitant to try bites of anything more solid. She believes that her vocal quality is at baseline, although her volume is soft. She consumes small, single sips of water without overt signs of difficulty but has immediate coughing when cued to take larger amounts. Particularly given her prolonged intubation, this could be concerning for silent aspiration on smaller quantities. Recommend continuing small sips of water and ice chips after oral care pending completion of MBS, scheduled for this afternoon with radiology.   HPI HPI: 27 yr old female with PMHx HTN, Asthma, Seizures and known ETOH abuse presented 5/25 with abdominal pain, nausea, vomiting. CTA/P shows pancreatitis. HTN and Tachycardic last ETOH was on 5/21, now s/p seizure, intubated 5/26-5/31/19.       SLP Plan  MBS       Recommendations  Diet recommendations: Other(comment)(sips of water, ice chips after oral care) Liquids provided via: Cup;No straw Medication Administration: Via alternative means Supervision: Full supervision/cueing for compensatory strategies Compensations: Slow rate;Small sips/bites Postural Changes and/or Swallow Maneuvers: Seated upright 90 degrees;Upright 30-60 min after meal                Oral Care Recommendations: Oral care QID;Oral care prior to ice chip/H20 Follow up Recommendations: Inpatient Rehab SLP Visit Diagnosis: Dysphagia, unspecified (R13.10) Plan: MBS       GO                Donna Short, Donna Short 01/25/2018, 10:49 AM  Donna Short, M.A. CCC-SLP [phone]

## 2018-01-25 NOTE — Consult Note (Signed)
Physical Medicine and Rehabilitation Consult Reason for Consult: Decreased functional mobility Referring Physician: [facility]   HPI: Donna Short is a 27 y.o. female with history of asthma, hypertension and seizure disorder maintained on Lamictal as well as tobacco and alcohol abuse.  Per chart review and mother, patient lives alone.  Patient is doing something on her phone throughout the encounter.  Second-floor apartment.  Was working at [employer].  Mother can provide assistance.  Presented 01/16/2018 with abdominal pain with nausea vomiting.  She had recently stopped taking her medications due to vomiting.  She developed respiratory failure requiring intubation.  CT of abdomen pelvis findings indicate of acute pancreatitis.  No abscesses noted.  WBC 15,800.  Cranial CT scan reviewed, unremarkable for acute intracranial process.  Noted elevated troponin in setting of demand ischemia as well as developing acute renal failure.  Echocardiogram completed with ejection fraction of 55%.  Follow-up renal services for renal placed on gentle IV fluids creatinine rebounded nicely to latest creatinine 0.99.  Subcutaneous Lovenox for DVT prophylaxis.  Patient currently on Keppra for seizure prophylaxis.  Patient was extubated 01/22/2018.  Presently with full liquid diet and advance as tolerated.  Therapy evaluation completed with recommendations of physical medicine rehab consult.  Review of Systems  Constitutional: Negative for chills and fever.  HENT: Negative for hearing loss.   Eyes: Negative for blurred vision and double vision.  Respiratory: Positive for shortness of breath. Negative for cough.   Cardiovascular: Negative for chest pain, palpitations and leg swelling.  Gastrointestinal: Positive for abdominal pain, nausea and vomiting.  Genitourinary: Negative for dysuria and flank pain.  Neurological: Positive for seizures and headaches.  All other systems reviewed and are negative.  Past  Medical History:  Diagnosis Date  . Asthma   . Hypertension   . Seizures (HCC)    Past Surgical History:  Procedure Laterality Date  . NO PAST SURGERIES     Family History  Problem Relation Age of Onset  . Diabetes Father   . Breast cancer Mother    Social History:  reports that she has quit smoking. Her smoking use included cigarettes. She uses smokeless tobacco. She reports that she does not drink alcohol or use drugs. Allergies:  Allergies  Allergen Reactions  . Benadryl [Diphenhydramine Hcl (Sleep)] Rash  . Latex Rash  . Penicillins Itching and Rash    Has patient had a PCN reaction causing immediate rash, facial/tongue/throat swelling, SOB or lightheadedness with hypotension: Yes Has patient had a PCN reaction causing severe rash involving mucus membranes or skin necrosis: No Has patient had a PCN reaction that required hospitalization: Yes Has patient had a PCN reaction occurring within the last 10 years: No If all of the above answers are "NO", then may proceed with Cephalosporin use.;  . Shrimp [Shellfish Allergy] Hives and Rash   Medications Prior to Admission  Medication Sig Dispense Refill  . aspirin-acetaminophen-caffeine (EXCEDRIN MIGRAINE) 250-250-65 MG tablet Take 2 tablets by mouth every 6 (six) hours as needed for headache or migraine.    . ondansetron (ZOFRAN-ODT) 4 MG disintegrating tablet Take 1 tablet (4 mg total) by mouth every 8 (eight) hours as needed for nausea or vomiting. 15 tablet 0  . lamoTRIgine (LAMICTAL) 100 MG tablet Take 1 tablet (100 mg total) by mouth 2 (two) times daily. (Patient not taking: Reported on 01/16/2018) 60 tablet 11    Home: Home Living Family/patient expects to be discharged to:: Private residence Living Arrangements: Alone  Available Help at Discharge: Family, Available 24 hours/day(Mother able to stay at dc per pt) Type of Home: Apartment(Second floor) Home Access: Stairs to enter Entrance Stairs-Number of Steps: 14;  flight Home Layout: One level Bathroom Shower/Tub: Tub/shower unit, Engineer, building services: Standard Home Equipment: None  Functional History: Prior Function Level of Independence: Independent Comments: works at [employer] in [employer], drives, independnet with all mobility and ADLs Functional Status:  Mobility: Bed Mobility Overal bed mobility: Needs Assistance Bed Mobility: Supine to Sit Rolling: Min assist Sidelying to sit: Mod assist, +2 for safety/equipment Supine to sit: Min guard General bed mobility comments: MIn Guard A for safety.  Transfers Overall transfer level: Needs assistance Equipment used: 2 person hand held assist Transfer via Lift Equipment: Stedy Transfers: Sit to/from Stand, Pharmacologist Sit to Stand: Min assist, +2 physical assistance Stand pivot transfers: Min assist, +2 physical assistance General transfer comment: Min A +2 for balance during sit<>Stand. Requiring +2 for stand pivot and demonstrating decreased balance and body awareness Ambulation/Gait General Gait Details: attemtped taking steps during transfer    ADL: ADL Overall ADL's : Needs assistance/impaired Eating/Feeding: NPO Grooming: Sitting, Supervision/safety, Set up Upper Body Bathing: Min guard, Sitting Lower Body Bathing: Min guard, Sit to/from stand Upper Body Dressing : Sitting, Minimal assistance Upper Body Dressing Details (indicate cue type and reason): Min A to don new gown Lower Body Dressing: Minimal assistance, +2 for physical assistance, Sit to/from stand Lower Body Dressing Details (indicate cue type and reason): Pt donned socks at EOB with MIn Guard A for safety and increased time. Pt requiring increased time to problem solving safe sequencing of socks as well as increased effort.  Toilet Transfer: Minimal assistance, +2 for physical assistance, Stand-pivot(Simulated recliner) Functional mobility during ADLs: Minimal assistance, +2 for physical assistance(stand  pivot to recliner) General ADL Comments: Pt requiring increased time and effort to perform ADLs. Requiring MIn Guard-Min A for safety and stability. Pt presenting with decreased activity tolerance and requiring increased effort for BADLs  Cognition: Cognition Overall Cognitive Status: Impaired/Different from baseline Orientation Level: Oriented to person, Oriented to place, Disoriented to time, Oriented to situation Cognition Arousal/Alertness: Awake/alert Behavior During Therapy: WFL for tasks assessed/performed, Restless Overall Cognitive Status: Impaired/Different from baseline Area of Impairment: Attention, Following commands, Safety/judgement, Awareness, Problem solving Orientation Level: Disoriented to, Situation, Place, Time Current Attention Level: Sustained Following Commands: Follows one step commands with increased time Safety/Judgement: Decreased awareness of safety, Decreased awareness of deficits Awareness: Emergent Problem Solving: Difficulty sequencing, Requires verbal cues, Requires tactile cues General Comments: Pt following simple commands. Answering questions about home set up and PLOF. Requiring increased time for problem solving and processing during ADLs.   Blood pressure (!) 148/116, pulse (!) 109, temperature 99 F (37.2 C), temperature source Oral, resp. rate (!) 23, height 5\' 2"  (1.575 m), weight 60.9 kg (134 lb 4.2 oz), SpO2 100 %. Physical Exam  Vitals reviewed. Constitutional: She is oriented to person, place, and time. She appears well-developed and well-nourished.  HENT:  Head: Normocephalic and atraumatic.  + NG  Eyes: EOM are normal. Right eye exhibits no discharge. Left eye exhibits no discharge.  Pupils reactive to light  Neck: Normal range of motion. Neck supple. No thyromegaly present.  Cardiovascular: Regular rhythm and normal heart sounds.  +Tachycardia  Respiratory:  Fair inspiratory effort.  Clear to auscultation  GI: Soft. Bowel sounds are  normal. She exhibits no distension.  Musculoskeletal:  No edema or tenderness in extremities  Neurological:  She is alert and oriented to person, place, and time.  Follows simple commands.   She could not recall her full hospital stay Motor (limited by participation): Right upper extremity/right lower extremity: 4/5 proximal to distal Left upper extremity/left lower extremity: 4-/5 proximal to distal  Skin: Skin is warm and dry.  Psychiatric: Her affect is blunt. Her speech is delayed. She expresses inappropriate judgment.    Results for orders placed or performed during the hospital encounter of 01/16/18 (from the past 24 hour(s))  Glucose, capillary     Status: Abnormal   Collection Time: 01/24/18  4:25 PM  Result Value Ref Range   Glucose-Capillary 104 (H) 65 - 99 mg/dL  Basic metabolic panel     Status: Abnormal   Collection Time: 01/24/18  4:44 PM  Result Value Ref Range   Sodium 144 135 - 145 mmol/L   Potassium 3.8 3.5 - 5.1 mmol/L   Chloride 105 101 - 111 mmol/L   CO2 29 22 - 32 mmol/L   Glucose, Bld 108 (H) 65 - 99 mg/dL   BUN 16 6 - 20 mg/dL   Creatinine, Ser 2.13 0.44 - 1.00 mg/dL   Calcium 8.5 (L) 8.9 - 10.3 mg/dL   GFR calc non Af Amer >60 >60 mL/min   GFR calc Af Amer >60 >60 mL/min   Anion gap 10 5 - 15  CBC with Differential/Platelet     Status: Abnormal   Collection Time: 01/25/18  5:06 AM  Result Value Ref Range   WBC 20.7 (H) 4.0 - 10.5 K/uL   RBC 2.00 (L) 3.87 - 5.11 MIL/uL   Hemoglobin 7.5 (L) 12.0 - 15.0 g/dL   HCT 08.6 (L) 57.8 - 46.9 %   MCV 112.5 (H) 78.0 - 100.0 fL   MCH 37.5 (H) 26.0 - 34.0 pg   MCHC 33.3 30.0 - 36.0 g/dL   RDW 62.9 52.8 - 41.3 %   Platelets 244 150 - 400 K/uL   Neutrophils Relative % 84 %   Lymphocytes Relative 8 %   Monocytes Relative 7 %   Eosinophils Relative 1 %   Basophils Relative 0 %   Neutro Abs 17.4 (H) 1.7 - 7.7 K/uL   Lymphs Abs 1.7 0.7 - 4.0 K/uL   Monocytes Absolute 1.4 (H) 0.1 - 1.0 K/uL   Eosinophils Absolute  0.2 0.0 - 0.7 K/uL   Basophils Absolute 0.0 0.0 - 0.1 K/uL  Magnesium     Status: None   Collection Time: 01/25/18  5:06 AM  Result Value Ref Range   Magnesium 1.9 1.7 - 2.4 mg/dL  Phosphorus     Status: Abnormal   Collection Time: 01/25/18  5:06 AM  Result Value Ref Range   Phosphorus 4.8 (H) 2.5 - 4.6 mg/dL  Comprehensive metabolic panel     Status: Abnormal   Collection Time: 01/25/18  5:06 AM  Result Value Ref Range   Sodium 144 135 - 145 mmol/L   Potassium 3.7 3.5 - 5.1 mmol/L   Chloride 106 101 - 111 mmol/L   CO2 29 22 - 32 mmol/L   Glucose, Bld 96 65 - 99 mg/dL   BUN 17 6 - 20 mg/dL   Creatinine, Ser 2.44 0.44 - 1.00 mg/dL   Calcium 8.6 (L) 8.9 - 10.3 mg/dL   Total Protein 5.9 (L) 6.5 - 8.1 g/dL   Albumin 2.0 (L) 3.5 - 5.0 g/dL   AST 34 15 - 41 U/L   ALT 16 14 - 54 U/L  Alkaline Phosphatase 53 38 - 126 U/L   Total Bilirubin 0.7 0.3 - 1.2 mg/dL   GFR calc non Af Amer >60 >60 mL/min   GFR calc Af Amer >60 >60 mL/min   Anion gap 9 5 - 15   Dg Chest Port 1 View  Result Date: 01/25/2018 CLINICAL DATA:  Status postextubation.  Pleural effusion. EXAM: PORTABLE CHEST 1 VIEW COMPARISON:  Jan 22, 2018 FINDINGS: There is airspace consolidation in the left lower lobe with moderate left pleural effusion, stable. Right lung is clear. Heart is mildly enlarged with pulmonary vascularity normal. Endotracheal tube is been removed. No pneumothorax. Central catheter tip is in the superior vena cava. Feeding tube tip is below the diaphragm. IMPRESSION: No pneumothorax. Tube and catheter positions as described. Persistent left lower lobe consolidation with moderate left pleural effusion. Right lung clear. Stable cardiac silhouette. Electronically Signed   By: Bretta BangWilliam  Woodruff III M.D.   On: 01/25/2018 07:20    Assessment/Plan: Diagnosis: Debility Labs and images independently reviewed.  Records reviewed and summated above.  1. Does the need for close, 24 hr/day medical supervision in  concert with the patient's rehab needs make it unreasonable for this patient to be served in a less intensive setting? Yes  2. Co-Morbidities requiring supervision/potential complications: AKI (avoid nephrotoxic meds), asthma (monitor O2 sats and RR with increased exertion), HTN (monitor and provide prns in accordance with increased physical exertion and pain), seizure disorder (cont meds), tobacco and alcohol abuse (counsel), Tachycardia (monitor in accordance with pain and increasing activity), tachypnea (monitor RR and O2 Sats with increased physical exertion), pancreatitis (wean IV Vanc/Zosyn and PO Vanc when appropriate), leukocytosis (cont to monitor for signs and symptoms of infection, further workup if indicated), ABLA (transfuse if necessary to ensure appropriate perfusion for increased activity tolerance) 3. Due to bladder management, bowel management, safety, skin/wound care, disease management, medication administration, pain management and patient education, does the patient require 24 hr/day rehab nursing? Yes 4. Does the patient require coordinated care of a physician, rehab nurse, PT (1-2 hrs/day, 5 days/week), OT (1-2 hrs/day, 5 days/week) and SLP (1-2 hrs/day, 5 days/week) to address physical and functional deficits in the context of the above medical diagnosis(es)? Yes Addressing deficits in the following areas: balance, endurance, locomotion, strength, transferring, bowel/bladder control, bathing, dressing, grooming, toileting, cognition and psychosocial support 5. Can the patient actively participate in an intensive therapy program of at least 3 hrs of therapy per day at least 5 days per week? Potentially 6. The potential for patient to make measurable gains while on inpatient rehab is excellent 7. Anticipated functional outcomes upon discharge from inpatient rehab are supervision  with PT, supervision with OT, n/a with SLP. 8. Estimated rehab length of stay to reach the above functional  goals is: 12-15 days. 9. Anticipated D/C setting: Home 10. Anticipated post D/C treatments: HH therapy and Home excercise program 11. Overall Rehab/Functional Prognosis: good  RECOMMENDATIONS: This patient's condition is appropriate for continued rehabilitative care in the following setting: CIR if patient willing and able to tolerate 3 hours of therapy per day if patient does not make functional progress quickly. Patient has agreed to participate in recommended program. Potentially Note that insurance prior authorization may be required for reimbursement for recommended care.  Comment: Rehab Admissions Coordinator to follow up.   I have personally performed a face to face diagnostic evaluation, including, but not limited to relevant history and physical exam findings, of this patient and developed relevant assessment and plan.  Additionally, I have reviewed and concur with the physician assistant's documentation above.   Maryla Morrow, MD, ABPMR Mcarthur Rossetti Angiulli, PA-C 01/25/2018

## 2018-01-25 NOTE — Progress Notes (Signed)
Modified Barium Swallow Progress Note  Patient Details  Name: Donna Short MRN: [id number] Date of Birth: 1991/02/25  Today's Date: 01/25/2018  Modified Barium Swallow completed.  Full report located under Chart Review in the Imaging Section.  Brief recommendations include the following:  Clinical Impression  Pt has a mild, likely cognitively-based oral dysphagia characterized by lingual pumping and slow transit of purees. She declined any other solid textures. Her ph[facility]. One wet-sounding cough noted during testing was not related to airway compromise. The barium tablet appeared to have slow clearance through the esophagus, particularly at the entrance of the stomach, although it did clear (MD not present to confirm). Pt does however describe regurgitation of water earlier this morning. Upon questioning, it appears as though she drank a large volume of water and then tried to lie down. Recommend starting with a full liquid diet for cognitive and possible esophageal issues. SLP will f/u for tolerance with good prognosis to advance.   Swallow Evaluation Recommendations   Recommended Consults: Consider esophageal assessment   SLP Diet Recommendations: Thin liquid(full liquids)   Liquid Administration via: Cup;Straw   Medication Administration: Whole meds with liquid   Supervision: Patient able to self feed;Intermittent supervision to cue for compensatory strategies   Compensations: Minimize environmental distractions;Slow rate;Small sips/bites;Other (Comment)(small amount at a time)   Postural Changes: Remain semi-upright after after feeds/meals (Comment);Seated upright at 90 degrees   Oral Care Recommendations: Oral care BID        Maxcine Hamaiewonsky, Felma Pfefferle 01/25/2018,2:18 PM   Maxcine HamLaura Paiewonsky, M.A. CCC-SLP [phone]

## 2018-01-25 NOTE — Progress Notes (Addendum)
Nutrition Follow-up  DOCUMENTATION CODES:   Not applicable  INTERVENTION:  Ensure Enlive po TID, each supplement provides 350 kcal and 20 grams of protein RD to monitor PO intake  NUTRITION DIAGNOSIS:   Inadequate oral intake related to inability to eat as evidenced by NPO status.  Ongoing  GOAL:   Patient will meet greater than or equal to 90% of their needs  Ongoing - not met  MONITOR:   Diet advancement, Vent status, Labs, Weight trends, TF tolerance, I & O's  REASON FOR ASSESSMENT:   Ventilator    ASSESSMENT:   Patient with PMH significant for seizure disorder and HTN. Presents this admission with complaints of significant abdominal pain with associated nausea/vomiting. Admitted for acute pancreatitis most likely alcohol related.    5/28 vital 1.5 @ 35 ml/hr with PS BID started  5/29 TF stopped 5/31 extubated 6/1 cortrak placed - no TF started, cortrak still in place  Pt without proper nutrition since admission (9 days). Completed NFPE - no depletions.   Pt reports eating well PTA; pt family reports that pt eats smaller meals during the day due to her work schedule and will have a "good sized dinner". Pt family reports pt eating soul food that the pt cooks. Pt reports no recent weight loss. Pt reports having an appetite and is open to supplementation. Spoke to RN; waiting for order to remove cortrak as pt was advanced to full liquids.    Medications reviewed: folvite, thiamine, vancocin, free water 200 ml Q4H.   Labs reviewed: albumin 2 (L), phosphorus 4.8 (H).   Diet Order:   Diet Order           Diet full liquid Room service appropriate? Yes; Fluid consistency: Thin  Diet effective now          EDUCATION NEEDS:   Not appropriate for education at this time  Skin:  Skin Assessment: Reviewed RN Assessment  Last BM:  5/29  Height:   Ht Readings from Last 1 Encounters:  01/17/18 5' 2"  (1.575 m)    Weight:   Wt Readings from Last 1 Encounters:   01/25/18 134 lb 4.2 oz (60.9 kg)    Ideal Body Weight:  50 kg  BMI:  Body mass index is 24.56 kg/m.  Estimated Nutritional Needs:   Kcal:  1415 kcal  Protein:  75-90 g  Fluid:  >1.4 L/day    Hope Budds, Dietetic Intern

## 2018-01-25 NOTE — Progress Notes (Signed)
Notified by patients insurance- will need to use [employer] for any HH/DME needs for approval- phone # [phone] fax [phone], CM at [name] is [place]- contact # [phone] ext [phone]

## 2018-01-25 NOTE — Progress Notes (Signed)
[place] TEAM 1 - Stepdown/ICU TEAM  Donna Short    Brief Narrative:  27yo F with a Hx of HTN, Asthma, Seizures, and known ETOH abuse who presented 5/25 with abdominal pain, nausea, vomiting. CT A/P noted acute pancreatitis. She suffered a seizure felt to be due to EtOH withdrawal, became obtunded, and was intubated.   Significant Events: 5/25 admit through ED  5/26 clinical decline - PCCM consulted > intubated + pressors  5/31 extubated   Subjective: Pt continues to have diarrhea.  She denies sob, n/v, or HA.  She has some diffuse abdom pain, but feels it is improving.    Assessment & Plan:  Acute encephalopathy - metabolic and post-ictal CT head negative for acute intracranial process - slowly improving - avoid sedatives as able   Alcohol Withdrawal with seizure Cont Keppra - much improved at this time   Tachycardia - Hypertension Due to above and acute inflam state - follow w/o change for now   Elevated troponin in setting of demand ischemia & renal failur TTE EF 55%, MV trivial regurg, RV systolic fxn mildly reduced, TV Mild regurg - no further w/u indicated   Acute hypoxic respiratory failure - B pulmonary infiltrates - Persistent L pleural effusion Remains on abx coverage - extubated days ago - CXR stable today   Diarrhea C diff negative - imodium prn - follow   Acute Pancreatitis secondary to ETOH Once passes swallow eval will begin w/ clear liquids and very slowly advance diet as tolerated   Dysphagia w/ protein calorie malnutrition following critical illness Failed bedside swallow on 6/1 - tube feeds held on 6/1 due to ?SBO v/s  Ileus - 6/2 abdomen softer - to have MBS today - no exam findings to suggests ileus today   Anemia of critical illness no evidence of bleeding - check B12 and folate as pt is macrocytic   Thrombocytopenia  Resolved   DVT prophylaxis: lovenox  Code  Status: FULL CODE Family Communication: spoke w/ mother at bedside at length Disposition Plan: SDU  Consultants:  PCCM Gen Surgery  Nephrology   Antimicrobials:  Zosyn 5/25 > Vancomycin 5/31>  Objective: Blood pressure (!) 116/93, pulse (!) 103, temperature 99.4 F (37.4 C), temperature source Oral, resp. rate (!) 27, height 5\' 2"  (1.575 m), weight 60.9 kg (134 lb 4.2 oz), SpO2 100 %.  Intake/Output Summary (Last 24 hours) at 01/25/2018 1116 Last data filed at 01/25/2018 0912 Gross Short 24 hour  Intake 3050 ml  Output 12 ml  Net 3038 ml   Filed Weights   01/23/18 0400 01/24/18 0355 01/25/18 0500  Weight: 62.4 kg (137 lb 9.1 oz) 62.1 kg (136 lb 14.5 oz) 60.9 kg (134 lb 4.2 oz)    Examination: General: No acute respiratory distress Lungs: poor air movement B bases L > R - no wheezing  Cardiovascular: regular - tachycardic - no M or rub  Abdomen: mildly tender diffusely, nondistended, soft, bowel sounds positive, no rebound, no ascites, no appreciable mass Extremities: No significant edema bilateral lower extremities  CBC: Recent Labs  Lab 01/22/18 0528 01/23/18 0537 01/24/18 0648 01/25/18 0506  WBC 21.1* 22.8* 22.3* 20.7*  NEUTROABS 16.9*  --  18.4* 17.4*  HGB 8.1* 8.4* 8.3* 7.5*  HCT 24.4* 24.5* 24.6* 22.5*  MCV 113.0* 109.4* 109.8* 112.5*  PLT 101* 131* 189 244   Basic Metabolic Panel: Recent Labs  Lab 01/23/18 0537 01/24/18 0000 01/24/18 [id number]  01/24/18 1644 01/25/18 0506  NA 143 146*  --  144 144  K 2.8* 3.3*  --  3.8 3.7  CL 100* 104  --  105 106  CO2 32 30  --  29 29  GLUCOSE 112* 111*  --  108* 96  BUN 16 18  --  16 17  CREATININE 0.75 0.78  --  0.87 0.99  CALCIUM 8.2* 8.6*  --  8.5* 8.6*  MG 1.1*  --  2.3  --  1.9  PHOS 3.9  --  4.0  --  4.8*   GFR: Estimated Creatinine Clearance: 72 mL/min (by C-G formula based on SCr of 0.99 mg/dL).  Liver Function Tests: Recent Labs  Lab 01/21/18 0336 01/22/18 0528 01/24/18 0648 01/25/18 0506  AST 47*  71* 32 34  ALT 19 22 19 16   ALKPHOS 63 79 72 53  BILITOT 1.1 0.7 1.0 0.7  PROT 5.3* 5.4* 6.0* 5.9*  ALBUMIN 1.6* 1.6* 1.9* 2.0*   Recent Labs  Lab 01/19/18 0329 01/21/18 0336 01/22/18 0528  LIPASE 135* 51 46  AMYLASE 158*  --   --     CBG: Recent Labs  Lab 01/23/18 2322 01/24/18 0339 01/24/18 0734 01/24/18 1143 01/24/18 1625  GLUCAP 77 98 98 85 104*    Recent Results (from the past 240 hour(s))  Blood Culture (routine x 2)     Status: None   Collection Time: 01/16/18  5:28 PM  Result Value Ref Range Status   Specimen Description BLOOD FEMORAL ARTERY RIGHT  Final   Special Requests   Final    BOTTLES DRAWN AEROBIC AND ANAEROBIC Blood Culture adequate volume   Culture   Final    NO GROWTH 5 DAYS Performed at [facility], [address], [place], [place] [address]    Report Status 01/21/2018 FINAL  Final  Blood Culture (routine x 2)     Status: None   Collection Time: 01/17/18 12:40 AM  Result Value Ref Range Status   Specimen Description BLOOD LEFT HAND  Final   Special Requests   Final    BOTTLES DRAWN AEROBIC ONLY Blood Culture results may not be optimal due to an inadequate volume of blood received in culture bottles   Culture   Final    NO GROWTH 5 DAYS Performed at [facility], [address] [place], [place] [address]    Report Status 01/22/2018 FINAL  Final  MRSA PCR Screening     Status: None   Collection Time: 01/17/18  7:12 AM  Result Value Ref Range Status   MRSA by PCR NEGATIVE NEGATIVE Final    Comment:        The GeneXpert MRSA Assay (FDA approved for NASAL specimens only), is one component of a comprehensive MRSA colonization surveillance program. It is not intended to diagnose MRSA infection nor to guide or monitor treatment for MRSA infections. Performed at [facility], [address] [place], [place] [address]   Culture, Urine     Status: None   Collection Time: 01/18/18  9:32 AM  Result Value Ref Range Status     Specimen Description URINE, RANDOM  Final   Special Requests NONE  Final   Culture   Final    NO GROWTH Performed at [facility], [address] [place], [place] [address]    Report Status 01/19/2018 FINAL  Final  Culture, blood (single)     Status: None (Preliminary result)  Collection Time: 01/21/18  7:55 PM  Result Value Ref Range Status   Specimen Description BLOOD HAND LEFT  Final   Special Requests   Final    BOTTLES DRAWN AEROBIC AND ANAEROBIC Blood Culture results may not be optimal due to an excessive volume of blood received in culture bottles   Culture   Final    NO GROWTH 3 DAYS Performed at [facility], [address], [place], [place] [address]    Report Status PENDING  Incomplete  C difficile quick scan w PCR reflex     Status: Abnormal   Collection Time: 01/24/18 11:36 AM  Result Value Ref Range Status   C Diff antigen POSITIVE (A) NEGATIVE Final   C Diff toxin NEGATIVE NEGATIVE Final   C Diff interpretation Results are indeterminate. See PCR results.  Final    Comment: Performed at [facility] Lab, [address] [place], [place] [address]  C. Diff by PCR, Reflexed     Status: None   Collection Time: 01/24/18 11:36 AM  Result Value Ref Range Status   Toxigenic C. Difficile by PCR NEGATIVE NEGATIVE Final    Comment: Patient is colonized with non toxigenic C. difficile. May not need treatment unless significant symptoms are present. Performed at [facility] Lab, [address] [place], [place] [address]      Scheduled Meds: . Chlorhexidine Gluconate Cloth  6 each Topical Daily  . cloNIDine  0.1 mg Transdermal Weekly  . folic acid  1 mg Intravenous Daily  . free water  200 mL Short Tube Q4H  . labetalol  200 mg Short Tube TID  . sodium chloride flush  10-40 mL Intracatheter Q12H  . thiamine injection  100 mg Intravenous Daily  . vancomycin  125 mg Oral QID     LOS: 9 days   Lonia Blood, MD [facility] Office   [phone] Pager - Text Page Short Amion as Short below:  On-Call/Text Page:      [email or web address]      password [email or web address]  If 7PM-7AM, please contact night-coverage [email or web address] Password [email or web address] 01/25/2018, 11:16 AM

## 2018-01-26 ENCOUNTER — Encounter (HOSPITAL_COMMUNITY): Payer: Self-pay | Admitting: General Practice

## 2018-01-26 ENCOUNTER — Other Ambulatory Visit: Payer: Self-pay

## 2018-01-26 LAB — AMMONIA: Ammonia: 36 umol/L — ABNORMAL HIGH (ref 9–35)

## 2018-01-26 LAB — COMPREHENSIVE METABOLIC PANEL
ALBUMIN: 2.1 g/dL — AB (ref 3.5–5.0)
ALK PHOS: 47 U/L (ref 38–126)
ALT: 15 U/L (ref 14–54)
AST: 33 U/L (ref 15–41)
Anion gap: 16 — ABNORMAL HIGH (ref 5–15)
BUN: 16 mg/dL (ref 6–20)
CO2: 24 mmol/L (ref 22–32)
CREATININE: 1.15 mg/dL — AB (ref 0.44–1.00)
Calcium: 8.8 mg/dL — ABNORMAL LOW (ref 8.9–10.3)
Chloride: 103 mmol/L (ref 101–111)
GFR calc Af Amer: >60 mL/min (ref >60)
GFR calc non Af Amer: >60 mL/min (ref >60)
GLUCOSE: 91 mg/dL (ref 65–99)
Potassium: 3.1 mmol/L — ABNORMAL LOW (ref 3.5–5.1)
SODIUM: 143 mmol/L (ref 135–145)
Total Bilirubin: 0.9 mg/dL (ref 0.3–1.2)
Total Protein: 6.4 g/dL — ABNORMAL LOW (ref 6.5–8.1)

## 2018-01-26 LAB — CBC
HCT: 21.6 % — ABNORMAL LOW (ref 36.0–46.0)
HEMOGLOBIN: 7.3 g/dL — AB (ref 12.0–15.0)
MCH: 37.4 pg — AB (ref 26.0–34.0)
MCHC: 33.8 g/dL (ref 30.0–36.0)
MCV: 110.8 fL — ABNORMAL HIGH (ref 78.0–100.0)
Platelets: 318 10*3/uL (ref 150–400)
RBC: 1.95 MIL/uL — AB (ref 3.87–5.11)
RDW: 13.1 % (ref 11.5–15.5)
WBC: 18.6 10*3/uL — ABNORMAL HIGH (ref 4.0–10.5)

## 2018-01-26 LAB — IRON AND TIBC
Iron: 35 ug/dL (ref 28–170)
SATURATION RATIOS: 16 % (ref 10.4–31.8)
TIBC: 217 ug/dL — ABNORMAL LOW (ref 250–450)
UIBC: 182 ug/dL

## 2018-01-26 LAB — RETICULOCYTES
RBC.: 1.95 MIL/uL — AB (ref 3.87–5.11)
RETIC COUNT ABSOLUTE: 25.4 10*3/uL (ref 19.0–186.0)
Retic Ct Pct: 1.3 % (ref 0.4–3.1)

## 2018-01-26 LAB — FERRITIN: Ferritin: 761 ng/mL — ABNORMAL HIGH (ref 11–307)

## 2018-01-26 LAB — CULTURE, BLOOD (SINGLE): Culture: NO GROWTH

## 2018-01-26 LAB — MAGNESIUM: Magnesium: 1.7 mg/dL (ref 1.7–2.4)

## 2018-01-26 LAB — TSH: TSH: 3.189 u[IU]/mL (ref 0.350–4.500)

## 2018-01-26 LAB — VITAMIN B12: VITAMIN B 12: 396 pg/mL (ref 180–914)

## 2018-01-26 LAB — LIPASE, BLOOD: Lipase: 48 U/L (ref 11–51)

## 2018-01-26 LAB — PHOSPHORUS: Phosphorus: 5.2 mg/dL — ABNORMAL HIGH (ref 2.5–4.6)

## 2018-01-26 LAB — FOLATE: Folate: 12.6 ng/mL (ref >5.9)

## 2018-01-26 MED ORDER — FOLIC ACID 1 MG PO TABS
1.0000 mg | ORAL_TABLET | Freq: Every day | ORAL | Status: DC
  Administered 2018-01-27 – 2018-01-29 (×3): 1 mg via ORAL
  Filled 2018-01-26 (×3): qty 1

## 2018-01-26 MED ORDER — POTASSIUM CHLORIDE CRYS ER 20 MEQ PO TBCR: 40.0000 meq | EXTENDED_RELEASE_TABLET | Freq: Two times a day (BID) | ORAL | Status: DC

## 2018-01-26 MED ORDER — ACETAMINOPHEN 325 MG PO TABS
650.0000 mg | ORAL_TABLET | Freq: Four times a day (QID) | ORAL | Status: DC | PRN
  Administered 2018-01-27 – 2018-01-28 (×2): 650 mg via ORAL
  Filled 2018-01-26 (×2): qty 2

## 2018-01-26 MED ORDER — LOPERAMIDE HCL 2 MG PO CAPS
2.0000 mg | ORAL_CAPSULE | Freq: Three times a day (TID) | ORAL | Status: DC | PRN
  Administered 2018-01-26: 2 mg via ORAL
  Filled 2018-01-26: qty 1

## 2018-01-26 MED ORDER — MAGNESIUM SULFATE 2 GM/50ML IV SOLN
2.0000 g | Freq: Once | INTRAVENOUS | Status: AC
  Administered 2018-01-26: 2 g via INTRAVENOUS
  Filled 2018-01-26: qty 50

## 2018-01-26 MED ORDER — LEVETIRACETAM 100 MG/ML PO SOLN
500.0000 mg | Freq: Two times a day (BID) | ORAL | Status: DC
  Administered 2018-01-26 – 2018-01-29 (×6): 500 mg via ORAL
  Filled 2018-01-26 (×6): qty 5

## 2018-01-26 MED ORDER — FAMOTIDINE IN NACL 20-0.9 MG/50ML-% IV SOLN
20.0000 mg | Freq: Once | INTRAVENOUS | Status: AC
  Administered 2018-01-26: 20 mg via INTRAVENOUS
  Filled 2018-01-26: qty 50

## 2018-01-26 MED ORDER — VITAMIN B-1 100 MG PO TABS
100.0000 mg | ORAL_TABLET | Freq: Every day | ORAL | Status: DC
  Administered 2018-01-27 – 2018-01-29 (×3): 100 mg via ORAL
  Filled 2018-01-26 (×3): qty 1

## 2018-01-26 MED ORDER — MAGNESIUM SULFATE 2 GM/50ML IV SOLN: 2.0000 g | Freq: Once | INTRAVENOUS | Status: DC

## 2018-01-26 MED ORDER — ONDANSETRON HCL 4 MG/2ML IJ SOLN
4.0000 mg | Freq: Four times a day (QID) | INTRAMUSCULAR | Status: DC | PRN
  Administered 2018-01-26: 4 mg via INTRAVENOUS
  Filled 2018-01-26: qty 2

## 2018-01-26 MED ORDER — POTASSIUM CHLORIDE CRYS ER 20 MEQ PO TBCR
40.0000 meq | EXTENDED_RELEASE_TABLET | Freq: Once | ORAL | Status: AC
  Administered 2018-01-26: 40 meq via ORAL
  Filled 2018-01-26: qty 2

## 2018-01-26 MED ORDER — POTASSIUM CHLORIDE 20 MEQ/15ML (10%) PO SOLN
ORAL | Status: AC
  Filled 2018-01-26: qty 30

## 2018-01-26 MED ORDER — LABETALOL HCL 200 MG PO TABS
200.0000 mg | ORAL_TABLET | Freq: Three times a day (TID) | ORAL | Status: DC
  Administered 2018-01-26 – 2018-01-29 (×8): 200 mg via ORAL
  Filled 2018-01-26 (×9): qty 1

## 2018-01-26 NOTE — Progress Notes (Signed)
Physical Therapy Treatment Patient Details Name: Donna Short MRN: [id number] DOB: 07/03/1991 Today's Date: 01/26/2018    History of Present Illness 27 yr old female with PMHx HTN, Asthma, Seizures and known ETOH abuse presented 5/25 with abdominal pain, nausea, vomiting. CTA/P shows pancreatitis. HTN and Tachycardic last ETOH was on 5/21, now s/p seizure, intubated 5/26-5/31/19.     PT Comments    Pt much improved. Pt able to amb 500' with IV pole. Pt functioning at minA level. Pt will need 24/7 assist for safe transition home at this time. Acute PT to con't to follow.   Follow Up Recommendations  Home health PT;Supervision/Assistance - 24 hour     Equipment Recommendations  Rolling walker with 5" wheels;3in1 (PT)    Recommendations for Other Services       Precautions / Restrictions Precautions Precautions: Fall Precaution Comments: freq loose stools Restrictions Weight Bearing Restrictions: No    Mobility  Bed Mobility Overal bed mobility: Needs Assistance Bed Mobility: Supine to Sit     Supine to sit: Min guard;HOB elevated     General bed mobility comments: increased time, used bed rail, HOB elevated  Transfers Overall transfer level: Needs assistance Equipment used: None Transfers: Sit to/from Stand Sit to Stand: Min assist         General transfer comment: increased time, minA to steady upon standing  Ambulation/Gait Ambulation/Gait assistance: Min assist Ambulation Distance (Feet): 500 Feet Assistive device: IV Pole Gait Pattern/deviations: Step-through pattern;Decreased stride length Gait velocity: slow Gait velocity interpretation: 1.31 - 2.62 ft/sec, indicative of limited community ambulator General Gait Details: pt pushed IV pole, attempted to amb without AD however pt very unsteady and reaching for objects to hold onto, pt with short steps and minimal step height   Stairs             Wheelchair Mobility    Modified Rankin  (Stroke Patients Only)       Balance Overall balance assessment: Needs assistance Sitting-balance support: Feet supported;Bilateral upper extremity supported Sitting balance-Leahy Scale: Good     Standing balance support: Single extremity supported Standing balance-Leahy Scale: Fair                              Cognition Arousal/Alertness: Awake/alert Behavior During Therapy: WFL for tasks assessed/performed Overall Cognitive Status: Impaired/Different from baseline Area of Impairment: Attention;Problem solving                   Current Attention Level: Sustained       Awareness: Emergent Problem Solving: Slow processing General Comments: pt delayed processing when performing pericare      Exercises      General Comments General comments (skin integrity, edema, etc.): pt s/p loose stool BM. modA for hygiene. pt able to perform pericare with guidance      Pertinent Vitals/Pain Pain Assessment: Faces Faces Pain Scale: No hurt    Home Living Family/patient expects to be discharged to:: Private residence Living Arrangements: Alone                  Prior Function            PT Goals (current goals can now be found in the care plan section) Acute Rehab PT Goals Patient Stated Goal: Return to PLOF Progress towards PT goals: Progressing toward goals    Frequency    Min 3X/week      PT Plan Discharge plan  needs to be updated    Co-evaluation              AM-PAC PT "6 Clicks" Daily Activity  Outcome Measure  Difficulty turning over in bed (including adjusting bedclothes, sheets and blankets)?: A Lot Difficulty moving from lying on back to sitting on the side of the bed? : A Lot Difficulty sitting down on and standing up from a chair with arms (e.g., wheelchair, bedside commode, etc,.)?: A Lot Help needed moving to and from a bed to chair (including a wheelchair)?: A Little Help needed walking in hospital room?: A  Little Help needed climbing 3-5 steps with a railing? : A Lot 6 Click Score: 14    End of Session Equipment Utilized During Treatment: Gait belt Activity Tolerance: Patient tolerated treatment well;Patient limited by fatigue Patient left: in chair;with call bell/phone within reach;with chair alarm set Nurse Communication: Mobility status;Need for lift equipment PT Visit Diagnosis: Unsteadiness on feet (R26.81);Muscle weakness (generalized) (M62.81);Pain;Difficulty in walking, not elsewhere classified (R26.2)     Time: [id number] PT Time Calculation (min) (ACUTE ONLY): 24 min  Charges:  $Gait Training: 8-22 mins $Therapeutic Activity: 8-22 mins                    G Codes:       Lewis ShockAshly Karalyne Nusser, PT, DPT Pager #: [phone] Office #: [phone]    Suraya Vidrine M Cullan Launer 01/26/2018, 2:04 PM

## 2018-01-26 NOTE — Progress Notes (Signed)
[place] TEAM 1 - Stepdown/ICU TEAM  Donna Short  ZOX:[id number] DOB: April 26, 1991 DOA: 01/16/2018 PCP: Patient, No Pcp Per    Brief Narrative:  27yo F with a Hx of HTN, Asthma, Seizures, and known ETOH abuse who presented 5/25 with abdominal pain, nausea, vomiting. CT A/P noted acute pancreatitis. She suffered a seizure felt to be due to EtOH withdrawal, became obtunded, and was intubated.   Significant Events: 5/25 admit through ED  5/26 clinical decline - PCCM consulted > intubated + pressors  5/31 extubated   Subjective: Pt states she is feeling much better today.  Denies cp, n/v, or abdom pain.  Is tolerating her clear liquids w/o trouble at this time.  Has been up ambulating.    Assessment & Plan:  Acute Pancreatitis secondary to ETOH Cont clear liquids only for now and very slowly advance diet as tolerated   Acute encephalopathy - metabolic and post-ictal CT head negative for acute intracranial process - slowly improving - avoid sedatives as able - transfer out of ICU   Alcohol Withdrawal with seizure Cont Keppra - much improved at this time   Tachycardia - Hypertension Due to above and acute inflam state - follow on tele - no change in med tx today - TSH in normal    Elevated troponin in setting of demand ischemia & renal failure TTE EF 55%, MV trivial regurg, RV systolic fxn mildly reduced, TV Mild regurg - no further w/u indicated at this time   Acute hypoxic respiratory failure - B pulmonary infiltrates - Persistent L pleural effusion extubated days ago - CXR stable 6/3 - no relevant culture data - MRSA screen negative - pt has completed a 10+ day course of Zosyn - I do not feel that ongoing use of abx is indicated - stop all abx today and follow - repeat CXR in AM to f/u effusion   Diarrhea C diff negative - imodium prn - stop oral Vanc which was apparently started empirically as stool studies are NOT c/w C diff colitis   Dysphagia w/ protein calorie  malnutrition following critical illness Failed bedside swallow on 6/1 - tube feeds held on 6/1 due to ?SBO v/s  Ileus - 6/2 abdomen softer - MBS 6/3 cleared her for liquid intake which she is thus far tolerating w/o difficulty - d/c NG tube and follow - very slow diet advancement in setting of severe pancreatitis at admit   Anemia of critical illness no evidence of bleeding - B12 and folate normal - TIBC c/w chronic malnutrition   Thrombocytopenia  Resolved   Mild hypokalemia  Supplement and follow  DVT prophylaxis: lovenox  Code Status: FULL CODE Family Communication: spoke w/ father at bedside Disposition Plan: transfer to tele bed - ambulate - slowly advance diet - watch pleural effusion  Consultants:  PCCM Gen Surgery  Nephrology   Antimicrobials:  Zosyn 5/25 > 6/4 Vancomycin 5/31> 6/4  Objective: Blood pressure (!) 121/96, pulse 100, temperature (P) 98 F (36.7 C), resp. rate (!) 26, height 5\' 2"  (1.575 m), weight 62.3 kg (137 lb 5.6 oz), SpO2 100 %.  Intake/Output Summary (Last 24 hours) at 01/26/2018 1325 Last data filed at 01/26/2018 0143 Gross per 24 hour  Intake 450 ml  Output -  Net 450 ml   Filed Weights   01/24/18 0355 01/25/18 0500 01/26/18 0600  Weight: 62.1 kg (136 lb 14.5 oz) 60.9 kg (134 lb 4.2 oz) 62.3 kg (137 lb 5.6 oz)    Examination: General:  No acute respiratory distress - alert and pleasant  Lungs: poor air movement B bases L > R w/o wheezing or crackles  Cardiovascular: RRR - no M or rub  Abdomen: NT, nondistended, soft, bowel sounds positive, no rebound Extremities: No significant edema B LE   CBC: Recent Labs  Lab 01/22/18 0528  01/24/18 0648 01/25/18 0506 01/26/18 0425  WBC 21.1*   < > 22.3* 20.7* 18.6*  NEUTROABS 16.9*  --  18.4* 17.4*  --   HGB 8.1*   < > 8.3* 7.5* 7.3*  HCT 24.4*   < > 24.6* 22.5* 21.6*  MCV 113.0*   < > 109.8* 112.5* 110.8*  PLT 101*   < > 189 244 318   < > = values in this interval not displayed.   Basic  Metabolic Panel: Recent Labs  Lab 01/24/18 0648 01/24/18 1644 01/25/18 0506 01/26/18 0425  NA  --  144 144 143  K  --  3.8 3.7 3.1*  CL  --  105 106 103  CO2  --  29 29 24   GLUCOSE  --  108* 96 91  BUN  --  16 17 16   CREATININE  --  0.87 0.99 1.15*  CALCIUM  --  8.5* 8.6* 8.8*  MG 2.3  --  1.9 1.7  PHOS 4.0  --  4.8* 5.2*   GFR: Estimated Creatinine Clearance: 62.7 mL/min (A) (by C-G formula based on SCr of 1.15 mg/dL (H)).  Liver Function Tests: Recent Labs  Lab 01/22/18 0528 01/24/18 0648 01/25/18 0506 01/26/18 0425  AST 71* 32 34 33  ALT 22 19 16 15   ALKPHOS 79 72 53 47  BILITOT 0.7 1.0 0.7 0.9  PROT 5.4* 6.0* 5.9* 6.4*  ALBUMIN 1.6* 1.9* 2.0* 2.1*   Recent Labs  Lab 01/21/18 0336 01/22/18 0528 01/26/18 0425  LIPASE 51 46 48    CBG: Recent Labs  Lab 01/23/18 2322 01/24/18 0339 01/24/18 0734 01/24/18 1143 01/24/18 1625  GLUCAP 77 98 98 85 104*    Recent Results (from the past 240 hour(s))  Blood Culture (routine x 2)     Status: None   Collection Time: 01/16/18  5:28 PM  Result Value Ref Range Status   Specimen Description BLOOD FEMORAL ARTERY RIGHT  Final   Special Requests   Final    BOTTLES DRAWN AEROBIC AND ANAEROBIC Blood Culture adequate volume   Culture   Final    NO GROWTH 5 DAYS Performed at [facility], [address] [place], [place] [address]    Report Status 01/21/2018 FINAL  Final  Blood Culture (routine x 2)     Status: None   Collection Time: 01/17/18 12:40 AM  Result Value Ref Range Status   Specimen Description BLOOD LEFT HAND  Final   Special Requests   Final    BOTTLES DRAWN AEROBIC ONLY Blood Culture results may not be optimal due to an inadequate volume of blood received in culture bottles   Culture   Final    NO GROWTH 5 DAYS Performed at [facility], [address] [place], [place] [address]    Report Status 01/22/2018 FINAL  Final  MRSA PCR Screening     Status: None   Collection Time: 01/17/18   7:12 AM  Result Value Ref Range Status   MRSA by PCR NEGATIVE NEGATIVE Final    Comment:        The GeneXpert MRSA Assay (FDA approved for NASAL specimens only),  is one component of a comprehensive MRSA colonization surveillance program. It is not intended to diagnose MRSA infection nor to guide or monitor treatment for MRSA infections. Performed at [facility], [address], [place], [place] [address]   Culture, Urine     Status: None   Collection Time: 01/18/18  9:32 AM  Result Value Ref Range Status   Specimen Description URINE, RANDOM  Final   Special Requests NONE  Final   Culture   Final    NO GROWTH Performed at [facility], [address] [place], [place] [address]    Report Status 01/19/2018 FINAL  Final  Culture, blood (single)     Status: None (Preliminary result)   Collection Time: 01/21/18  7:55 PM  Result Value Ref Range Status   Specimen Description BLOOD HAND LEFT  Final   Special Requests   Final    BOTTLES DRAWN AEROBIC AND ANAEROBIC Blood Culture results may not be optimal due to an excessive volume of blood received in culture bottles   Culture   Final    NO GROWTH 4 DAYS Performed at [facility], [address], [place], [place] [address]    Report Status PENDING  Incomplete  C difficile quick scan w PCR reflex     Status: Abnormal   Collection Time: 01/24/18 11:36 AM  Result Value Ref Range Status   C Diff antigen POSITIVE (A) NEGATIVE Final   C Diff toxin NEGATIVE NEGATIVE Final   C Diff interpretation Results are indeterminate. See PCR results.  Final    Comment: Performed at [facility], [address], [place], [place] [address]  C. Diff by PCR, Reflexed     Status: None   Collection Time: 01/24/18 11:36 AM  Result Value Ref Range Status   Toxigenic C. Difficile by PCR NEGATIVE NEGATIVE Final    Comment: Patient is colonized with non toxigenic C. difficile. May not need treatment unless significant symptoms are  present. Performed at [facility], [address] [place], [place] [address]      Scheduled Meds: . Chlorhexidine Gluconate Cloth  6 each Topical Daily  . cloNIDine  0.1 mg Transdermal Weekly  . enoxaparin (LOVENOX) injection  40 mg Subcutaneous Q24H  . feeding supplement (ENSURE ENLIVE)  237 mL Oral TID BM  . folic acid  1 mg Per Tube Daily  . free water  200 mL Per Tube Q4H  . labetalol  200 mg Per Tube TID  . levETIRAcetam  500 mg Per Tube BID  . sodium chloride flush  10-40 mL Intracatheter Q12H  . thiamine  100 mg Per Tube Daily  . vancomycin  125 mg Oral QID     LOS: 10 days   Lonia Blood, MD [facility] Office  [phone] Pager - Text Page per Amion as per below:  On-Call/Text Page:      [email or web address]      password [email or web address]  If 7PM-7AM, please contact night-coverage [email or web address] Password [email or web address] 01/26/2018, 1:25 PM

## 2018-01-26 NOTE — Progress Notes (Addendum)
Patient trasfered from [facility] to [phone] via wheelchair; alert and oriented x 4; no complaints of pain; no IV access; skin intact; dressing on RUC clean, dry, intact - central cath was removed. Orient patient to room and unit;  instructed how to use the call bell and  fall risk precautions. Will continue to monitor the patient.

## 2018-01-26 NOTE — Progress Notes (Signed)
Rehab admissions - patient did well with therapy today and [facility] once [facility] is medically stable.  Unlikely that she will need a CIR stay.  Call me for questions.  #[phone]

## 2018-01-26 NOTE — Care Management Note (Addendum)
Case Management Note Donn PieriniKristi Fay Swider RN,[name] Unit [facility] 1-22 Case Manager  [phone]  Patient Details  Name: Donna Short MRN: [id number] Date of Birth: 01/01/1991  Subjective/Objective:  Pt admitted with acute pancreatitis, seizure secondary to ETOH w/d with need for intubation and vent support-  Extubated 5/31                 Action/Plan: PTA Pt lived at home alone,  Per PT/OT evals recommendation for CIR, consult has been placed- CIR following for possible admission, CSW and CM will follow - CM did receive call from patients insurance provided on 6/4 regarding transition of care needs- will need to use [employer] for any HH/DME needs for approval- phone # [phone] fax [phone], CM at [name] is [place]- contact # [phone]   Expected Discharge Date:                  Expected Discharge Plan:  IP Rehab Facility  In-House Referral:  Clinical Social Work  Discharge planning Services  CM Consult  Post Acute Care Choice:    Choice offered to:     DME Arranged:    DME Agency:     HH Arranged:    HH Agency:     Status of Service:  In process, will continue to follow  If discussed at Long Length of Stay Meetings, dates discussed:    Discharge Disposition:   Additional Comments:  Darrold SpanWebster, Deforrest Bogle Hall, RN 01/26/2018, 12:05 PM

## 2018-01-26 NOTE — Progress Notes (Signed)
Rehab admissions - I am following for potential acute inpatient rehab admission.  I would like to see PTOT notes today and then can better determine rehab needs.  I will see patient this am.  Call me for questions.  #[phone]

## 2018-01-26 NOTE — Progress Notes (Signed)
Patient called Clinical research associatewriter in her room and she said she want to go home tonight. I explained to her that she needs to be on observation and tomorrow doctor will decide if she is ready to leave or not. She said she wants to talk with her doctor. Writer paged Dr. Sharon SellerMcClung and informed him about her request. Will continue to monitor.

## 2018-01-26 NOTE — Progress Notes (Signed)
  Speech Language Pathology Treatment: Dysphagia  Patient Details Name: Donna Short MRN: [id number] DOB: 10-14-1990 Today's Date: 01/26/2018 Time: [id number] SLP Time Calculation (min) (ACUTE ONLY): 11 min  Assessment / Plan / Recommendation Clinical Impression  RN reports that pt had some additional vomiting overnight. Discussed with MD who changed diet to clear liquids only. Pt's mentation continues to improve. Today she is recalling daily events with more detail and accuracy. Education and recommendations were reinforced from previous date prior to intake. Pt then consumed water via straw with no overt signs of aspiration and no additional cueing needed. Recommend continuing clear liquid diet with thin liquids at this time. SLP will f/u as pt is able to progress diet due to oral dysphagia noted on MBS; however, suspect that this was primarily cognitive in nature and will likely continue to improve as her mentation does.   HPI HPI: 27 yr old female with PMHx HTN, Asthma, Seizures and known ETOH abuse presented 5/25 with abdominal pain, nausea, vomiting. CTA/P shows pancreatitis. HTN and Tachycardic last ETOH was on 5/21, now s/p seizure, intubated 5/26-5/31/19.       SLP Plan  Continue with current plan of care       Recommendations  Diet recommendations: Thin liquid(CLD) Liquids provided via: Cup;Straw Medication Administration: Whole meds with liquid(vs alternative means if regurgitating) Supervision: Patient able to self feed;Intermittent supervision to cue for compensatory strategies Compensations: Minimize environmental distractions;Slow rate;Small sips/bites;Other (Comment)(small amounts at a time) Postural Changes and/or Swallow Maneuvers: Seated upright 90 degrees;Upright 30-60 min after meal                Oral Care Recommendations: Oral care BID Follow up Recommendations: Inpatient Rehab SLP Visit Diagnosis: Dysphagia, oral phase (R13.11) Plan: Continue with current  plan of care       GO                Maxcine Hamaiewonsky, Mallie Linnemann 01/26/2018, 11:35 AM  Maxcine HamLaura Paiewonsky, M.A. CCC-SLP [phone]

## 2018-01-26 NOTE — Progress Notes (Signed)
Patient complaining of nausea and heartburn. [facility] paged for PRN orders. Awaiting response.

## 2018-01-26 NOTE — Progress Notes (Signed)
Occupational Therapy Treatment Patient Details Name: Donna Short MRN: [id number] DOB: [birth year] Today's Date: 01/26/2018    History of present illness 27 yr old female with PMHx HTN, Asthma, Seizures and known ETOH abuse presented 5/25 with abdominal pain, nausea, vomiting. CTA/P shows pancreatitis. HTN and Tachycardic last ETOH was on 5/21, now s/p seizure, intubated 5/26-5/31/19.    OT comments  Pt progressing towards established goals. Pt performing toilet transfer, toilet hygiene, and grooming at sink with Min Guard A-Min A for balance. Pt demonstrating increased activity tolerance and is motivated to participate in therapy. Demonstrating increased cognition and able to answer questions and participate in conversation. However, continues to present with poor problem solving and requires min cues for safety. Pt progressing well and update dc recommendation to home with HHOT and 24/7 initial support. Will continue to follow acutely to facilitate safe dc.   Of note, continues to present with yellow, fluid stool and was incontinent in bed upon OT arrival; RN notified.    Follow Up Recommendations  Supervision/Assistance - 24 hour;Home health OT    Equipment Recommendations  3 in 1 bedside commode    Recommendations for Other Services PT consult    Precautions / Restrictions Precautions Precautions: Fall Restrictions Weight Bearing Restrictions: No       Mobility Bed Mobility Overal bed mobility: Needs Assistance Bed Mobility: Supine to Sit     Supine to sit: Supervision;HOB elevated     General bed mobility comments: supervision for safety  Transfers Overall transfer level: Needs assistance Equipment used: None Transfers: Sit to/from Stand Sit to Stand: Min assist         General transfer comment: Min A to steady in standing    Balance Overall balance assessment: Needs assistance Sitting-balance support: Feet supported;Bilateral upper extremity  supported Sitting balance-Leahy Scale: Good Sitting balance - Comments: Bring ankles to knees for donning socks at EOB without LOB   Standing balance support: Bilateral upper extremity supported Standing balance-Leahy Scale: Fair Standing balance comment: Static standing without UE support                           ADL either performed or assessed with clinical judgement   ADL Overall ADL's : Needs assistance/impaired Eating/Feeding: Set up;Supervision/ safety;Sitting   Grooming: Oral care;Min guard;Standing       Lower Body Bathing: Sit to/from stand;Minimal assistance       Lower Body Dressing: Min guard;Sit to/from stand Lower Body Dressing Details (indicate cue type and reason): Donned socks at EOB Toilet Transfer: Min guard;Ambulation;Regular Merchandiser, retail A for safety Toileting: Min Guard A; sitting and laterally leaning         Functional mobility during ADLs: Minimal assistance;Cueing for safety(Cues to slow down) General ADL Comments: Pt with steady progression towards established goals and presenting with increased activity tolerance. COntinue to present with fatigue and weakness but motivated to participate. Pt performing ADLs with Min Guard A-Min A and functional mobiltiy with Min A. Continue to present with poor balance     Vision       Perception     Praxis      Cognition Arousal/Alertness: Awake/alert Behavior During Therapy: WFL for tasks assessed/performed;Restless Overall Cognitive Status: Impaired/Different from baseline Area of Impairment: Attention;Following commands;Safety/judgement;Awareness;Problem solving                 Orientation Level: Disoriented to;Situation;Place;Time Current Attention Level: Sustained   Following Commands: Follows one  step commands with increased time Safety/Judgement: Decreased awareness of safety;Decreased awareness of deficits Awareness: Emergent Problem Solving: Difficulty  sequencing;Requires verbal cues;Requires tactile cues General Comments: Pt following simple commands. Answering questions about home set up and PLOF. Requiring increased time for problem solving and processing during ADLs.         Exercises     Shoulder Instructions       General Comments SpO2 staying in 90s on RA. RR in 20s. HR 130s.    Pertinent Vitals/ Pain       Pain Assessment: Faces Faces Pain Scale: No hurt Pain Location: abdomen Pain Descriptors / Indicators: Discomfort Pain Intervention(s): Monitored during session;Repositioned  Home Living                                          Prior Functioning/Environment              Frequency  Min 3X/week        Progress Toward Goals  OT Goals(current goals can now be found in the care plan section)  Progress towards OT goals: Progressing toward goals  Acute Rehab OT Goals Patient Stated Goal: Return to PLOF OT Goal Formulation: With patient Time For Goal Achievement: 02/08/18 Potential to Achieve Goals: Good ADL Goals Pt Will Perform Grooming: with modified independence;standing Pt Will Perform Upper Body Bathing: with modified independence;standing Pt Will Perform Lower Body Bathing: with modified independence;sit to/from stand Pt Will Transfer to Toilet: with modified independence;regular height toilet;ambulating Pt Will Perform Toileting - Clothing Manipulation and hygiene: with modified independence;sit to/from stand  Plan Discharge plan needs to be updated;Frequency needs to be updated    Co-evaluation                 AM-PAC PT "6 Clicks" Daily Activity     Outcome Measure   Help from another person eating meals?: A Little Help from another person taking care of personal grooming?: A Little Help from another person toileting, which includes using toliet, bedpan, or urinal?: A Little Help from another person bathing (including washing, rinsing, drying)?: A Little Help from  another person to put on and taking off regular upper body clothing?: A Little Help from another person to put on and taking off regular lower body clothing?: A Little 6 Click Score: 18    End of Session Equipment Utilized During Treatment: Gait belt  OT Visit Diagnosis: Unsteadiness on feet (R26.81);Other abnormalities of gait and mobility (R26.89);Muscle weakness (generalized) (M62.81);Other symptoms and signs involving cognitive function;Pain Pain - part of body: (Abdomen)   Activity Tolerance Patient tolerated treatment well   Patient Left in chair;with call bell/phone within reach   Nurse Communication Mobility status;Other (comment)(BM in bed)        Time: [id number] OT Time Calculation (min): 38 min  Charges: OT General Charges $OT Visit: 1 Visit OT Treatments $Self Care/Home Management : 38-52 mins  Hafsah Hendler MSOT, OTR/L Acute Rehab Pager: [phone] Office: [phone]   Theodoro GristCharis M Kairos Panetta 01/26/2018, 10:06 AM

## 2018-01-27 ENCOUNTER — Inpatient Hospital Stay (HOSPITAL_COMMUNITY): Payer: Managed Care, Other (non HMO)

## 2018-01-27 LAB — BASIC METABOLIC PANEL
Anion gap: 13 (ref 5–15)
BUN: 10 mg/dL (ref 6–20)
CALCIUM: 9.3 mg/dL (ref 8.9–10.3)
CO2: 25 mmol/L (ref 22–32)
CREATININE: 0.98 mg/dL (ref 0.44–1.00)
Chloride: 101 mmol/L (ref 101–111)
GFR calc Af Amer: >60 mL/min (ref >60)
Glucose, Bld: 93 mg/dL (ref 65–99)
Potassium: 3.3 mmol/L — ABNORMAL LOW (ref 3.5–5.1)
SODIUM: 139 mmol/L (ref 135–145)

## 2018-01-27 LAB — MAGNESIUM: MAGNESIUM: 2 mg/dL (ref 1.7–2.4)

## 2018-01-27 LAB — CBC
HCT: 23.5 % — ABNORMAL LOW (ref 36.0–46.0)
Hemoglobin: 7.7 g/dL — ABNORMAL LOW (ref 12.0–15.0)
MCH: 36.2 pg — ABNORMAL HIGH (ref 26.0–34.0)
MCHC: 32.8 g/dL (ref 30.0–36.0)
MCV: 110.3 fL — AB (ref 78.0–100.0)
Platelets: 396 10*3/uL (ref 150–400)
RBC: 2.13 MIL/uL — ABNORMAL LOW (ref 3.87–5.11)
RDW: 13 % (ref 11.5–15.5)
WBC: 15.1 10*3/uL — AB (ref 4.0–10.5)

## 2018-01-27 NOTE — Progress Notes (Signed)
Occupational Therapy Treatment Patient Details Name: DEVANI ODONNEL MRN: [id number] DOB: 1991/07/17 Today's Date: 01/27/2018    History of present illness 27 yr old female with PMHx HTN, Asthma, Seizures and known ETOH abuse presented 5/25 with abdominal pain, nausea, vomiting. CTA/P shows pancreatitis. HTN and Tachycardic last ETOH was on 5/21, now s/p seizure, intubated 5/26-5/31/19.    OT comments  Pt has progressed to a set up to supervision level in ADL and ADL transfers. She has no awareness of events of this hospitalization or recall of requiring intubation. Pt is working toward increasing tolerance of eating. She is eager to go home.  Follow Up Recommendations  Home health OT    Equipment Recommendations  None recommended by OT    Recommendations for Other Services      Precautions / Restrictions Precautions Precautions: Fall Restrictions Weight Bearing Restrictions: No       Mobility Bed Mobility Overal bed mobility: Modified Independent Bed Mobility: Supine to Sit;Sit to Supine           General bed mobility comments: increased time and HOB elevated  Transfers Overall transfer level: Needs assistance Equipment used: None Transfers: Sit to/from Stand Sit to Stand: Supervision         General transfer comment: supervision for safety    Balance Overall balance assessment: Needs assistance Sitting-balance support: Feet supported;Bilateral upper extremity supported Sitting balance-Leahy Scale: Good     Standing balance support: No upper extremity supported;During functional activity Standing balance-Leahy Scale: Fair                             ADL either performed or assessed with clinical judgement   ADL Overall ADL's : Needs assistance/impaired     Grooming: Standing;Wash/dry face;Supervision/safety           Upper Body Dressing : Set up;Sitting   Lower Body Dressing: Supervision/safety;Sit to/from stand Lower Body Dressing  Details (indicate cue type and reason): socks Toilet Transfer: Supervision/safety;Ambulation;Regular Social worker and Hygiene: Supervision/safety;Sitting/lateral lean       Functional mobility during ADLs: Supervision/safety General ADL Comments: applied lotion to legs and feet      Vision       Perception     Praxis      Cognition Arousal/Alertness: Awake/alert Behavior During Therapy: WFL for tasks assessed/performed Overall Cognitive Status: Within Functional Limits for tasks assessed                                          Exercises     Shoulder Instructions       General Comments      Pertinent Vitals/ Pain       Pain Assessment: Faces Faces Pain Scale: Hurts little more Pain Location: abdomen with mobility Pain Descriptors / Indicators: Guarding;Sore Pain Intervention(s): Monitored during session  Home Living                                          Prior Functioning/Environment              Frequency  Min 2X/week        Progress Toward Goals  OT Goals(current goals can now be found in the care plan section)  Progress towards OT goals: Progressing toward goals  Acute Rehab OT Goals Patient Stated Goal: Return to PLOF OT Goal Formulation: With patient Time For Goal Achievement: 02/08/18 Potential to Achieve Goals: Good  Plan Discharge plan remains appropriate    Co-evaluation                 AM-PAC PT "6 Clicks" Daily Activity     Outcome Measure   Help from another person eating meals?: None Help from another person taking care of personal grooming?: A Little Help from another person toileting, which includes using toliet, bedpan, or urinal?: A Little Help from another person bathing (including washing, rinsing, drying)?: A Little Help from another person to put on and taking off regular upper body clothing?: None Help from another person to put on and taking  off regular lower body clothing?: A Little 6 Click Score: 20    End of Session Equipment Utilized During Treatment: Gait belt  OT Visit Diagnosis: Unsteadiness on feet (R26.81);Other abnormalities of gait and mobility (R26.89);Muscle weakness (generalized) (M62.81);Other symptoms and signs involving cognitive function;Pain   Activity Tolerance Patient tolerated treatment well   Patient Left in bed;with call bell/phone within reach;with family/visitor present   Nurse Communication          Time: [id number] OT Time Calculation (min): 22 min  Charges: OT General Charges $OT Visit: 1 Visit OT Treatments $Self Care/Home Management : 8-22 mins  01/27/2018 Martie RoundJulie Sharde Gover, OTR/L Pager: [phone] [place] PlanasMayberry, [place] BailiffJulie Lynn 01/27/2018, 4:12 PM

## 2018-01-27 NOTE — Progress Notes (Signed)
PROGRESS NOTE        PATIENT DETAILS Name: Donna Short Age: 27 y.o. Sex: female Date of Birth: Mar 20, 1991 Admit Date: 01/16/2018 Admitting Physician Carin Hock, MD HYQ:MVHQION, No Pcp Per  Brief Narrative: Patient is a 27 y.o. female with long-standing history of alcohol abuse, seizure disorder, asthma presented with pancreatitis, hospital course complicated by development of acute encephalopathy, possible alcohol withdrawal seizure, bilateral pulmonary infiltrates-highly suspicious for probable aspiration pneumonia-she was intubated and managed in the intensive care unit.  Upon stability she was transferred to the trial hospitalist service.  See below for further details  Significant Events: 5/25 admit through ED  5/26 clinical decline - PCCM consulted > intubated + pressors  5/31 extubated    Subjective: Still has upper abdominal pain but overall better.  Had a bowel movement this morning.  Tolerating liquids.  Assessment/Plan: Acute pancreatitis: Likely secondary to EtOH-still with some abdominal pain but clinically improved-continue to slowly advance diet, continue with supportive care.   Acute metabolicencephalopathy: Resolved and she is completely awake alert.  CT head negative for acute process.  Acute hypoxic respiratory failure with bilateral pulmonary infiltrates-suspected aspiration pneumonia: Dramatically improved-completed a course of antimicrobial therapy and no longer on antibiotics.  All cultures continue to be negative.  Afebrile-leukocytosis is resolving slowly.  Alcohol withdrawal: Out of the window for alcohol withdrawal-she was managed with Precedex infusion and CIWA protocol while in the ICU  Elevated troponin: Likely demand ischemia-TTE showed EF around 55%-doubt further work-up is required.  Seizure disorder: Although suspected to have a alcohol withdrawal seizure-she does apparently have a history of seizure  disorder-she used to follow with neurology in the past.  Continue Keppra-resume outpatient follow-up with neurology on discharge.  Hypertension/tachycardia: Improving with labetalol-follow  Anemia: Likely secondary to acute illness-no evidence of blood loss-hemoglobin stable at this point.  Follow periodically  Thrombocytopenia: Resolved  Hypokalemia: Replete and recheck.  Dysphagia: Suspect this was related to confusion/ICU illness when she was critically ill-continue full liquid diet-await further recommendations from speech therapy  Protein calorie malnutrition following critical illness: Continue supplements  DVT Prophylaxis: Prophylactic Lovenox   Code Status: Full code  Family Communication: None at bedside  Disposition Plan: Remain inpatient-home in the next few days  Antimicrobial agents: Anti-infectives (From admission, onward)   Start     Dose/Rate Route Frequency Ordered Stop   01/24/18 2200  vancomycin (VANCOCIN) IVPB 750 mg/150 ml premix  Status:  Discontinued     750 mg 150 mL/hr over 60 Minutes Intravenous Every 12 hours 01/24/18 1047 01/26/18 1332   01/24/18 1800  vancomycin (VANCOCIN) 50 mg/mL oral solution 125 mg  Status:  Discontinued     125 mg Oral 4 times daily 01/24/18 1605 01/26/18 1331   01/22/18 1800  vancomycin (VANCOCIN) IVPB 750 mg/150 ml premix  Status:  Discontinued     750 mg 150 mL/hr over 60 Minutes Intravenous Every 8 hours 01/22/18 1143 01/24/18 1047   01/22/18 1145  vancomycin (VANCOCIN) 1,250 mg in sodium chloride 0.9 % 250 mL IVPB     1,250 mg 166.7 mL/hr over 90 Minutes Intravenous  Once 01/22/18 1143 01/22/18 1325   01/16/18 2200  piperacillin-tazobactam (ZOSYN) IVPB 3.375 g  Status:  Discontinued     3.375 g 12.5 mL/hr over 240 Minutes Intravenous Every 8 hours 01/16/18 1644 01/26/18 1355   01/16/18 1645  piperacillin-tazobactam (ZOSYN) IVPB 3.375 g     3.375 g 100 mL/hr over 30 Minutes Intravenous  Once 01/16/18 1637 01/16/18  1823      Procedures: ETT 5/26 >> 5/31 L [place] CVC 5/26>> R [place]  5/26 > 5/31 R EJ >5/25  CONSULTS:  pulmonary/intensive care, nephrology and general surgery  Time spent: 25 minutes-Greater than 50% of this time was spent in counseling, explanation of diagnosis, planning of further management, and coordination of care.  MEDICATIONS: Scheduled Meds: . cloNIDine  0.1 mg Transdermal Weekly  . enoxaparin (LOVENOX) injection  40 mg Subcutaneous Q24H  . feeding supplement (ENSURE ENLIVE)  237 mL Oral TID BM  . folic acid  1 mg Oral Daily  . labetalol  200 mg Oral TID  . levETIRAcetam  500 mg Oral BID  . sodium chloride flush  10-40 mL Intracatheter Q12H  . thiamine  100 mg Oral Daily   Continuous Infusions: PRN Meds:.acetaminophen, loperamide, ondansetron, sodium chloride flush, traMADol   PHYSICAL EXAM: Vital signs: Vitals:   01/26/18 2116 01/27/18 0621 01/27/18 0621 01/27/18 1347  BP: (!) 127/98 (!) 138/106 (!) 140/103 115/87  Pulse: 96 94 (!) 102 95  Resp: 17   18  Temp: 100.3 F (37.9 C) 99.3 F (37.4 C)  99 F (37.2 C)  TempSrc: Oral Oral  Oral  SpO2: 100% 100%  100%  Weight:      Height:       Filed Weights   01/25/18 0500 01/26/18 0600 01/26/18 1537  Weight: 60.9 kg (134 lb 4.2 oz) 62.3 kg (137 lb 5.6 oz) 57 kg (125 lb 10.6 oz)   Body mass index is 22.98 kg/m.   General appearance :Awake, alert, not in any distress. Eyes:.Pink conjunctiva HEENT: Atraumatic and Normocephalic Neck: supple, Resp:Good air entry bilaterally, no added sounds  CVS: S1 S2 regular, no murmurs.  GI: Bowel sounds present, moderately tender in the epigastric area without any peritoneal signs.  Extremities: B/L Lower Ext shows no edema, both legs are warm to touch Neurology:  speech clear,Non focal, sensation is grossly intact. Psychiatric: Normal judgment and insight. Alert and oriented x 3. Normal mood. Musculoskeletal:No digital cyanosis Skin:No Rash, warm and  dry Wounds:N/A  I have personally reviewed following labs and imaging studies  LABORATORY DATA: CBC: Recent Labs  Lab 01/22/18 0528 01/23/18 0537 01/24/18 0648 01/25/18 0506 01/26/18 0425 01/27/18 0612  WBC 21.1* 22.8* 22.3* 20.7* 18.6* 15.1*  NEUTROABS 16.9*  --  18.4* 17.4*  --   --   HGB 8.1* 8.4* 8.3* 7.5* 7.3* 7.7*  HCT 24.4* 24.5* 24.6* 22.5* 21.6* 23.5*  MCV 113.0* 109.4* 109.8* 112.5* 110.8* 110.3*  PLT 101* 131* 189 244 318 396    Basic Metabolic Panel: Recent Labs  Lab 01/22/18 0528 01/23/18 0537 01/24/18 0000 01/24/18 0648 01/24/18 1644 01/25/18 0506 01/26/18 0425 01/27/18 0612  NA 145 143 146*  --  144 144 143 139  K 3.4* 2.8* 3.3*  --  3.8 3.7 3.1* 3.3*  CL 108 100* 104  --  105 106 103 101  CO2 29 32 30  --  29 29 24 25   GLUCOSE 155* 112* 111*  --  108* 96 91 93  BUN 17 16 18   --  16 17 16 10   CREATININE 0.89 0.75 0.78  --  0.87 0.99 1.15* 0.98  CALCIUM 8.2* 8.2* 8.6*  --  8.5* 8.6* 8.8* 9.3  MG 1.2* 1.1*  --  2.3  --  1.9 1.7 2.0  PHOS 4.4 3.9  --  4.0  --  4.8* 5.2*  --     GFR: Estimated Creatinine Clearance: 67 mL/min (by C-G formula based on SCr of 0.98 mg/dL).  Liver Function Tests: Recent Labs  Lab 01/21/18 0336 01/22/18 0528 01/24/18 0648 01/25/18 0506 01/26/18 0425  AST 47* 71* 32 34 33  ALT 19 22 19 16 15   ALKPHOS 63 79 72 53 47  BILITOT 1.1 0.7 1.0 0.7 0.9  PROT 5.3* 5.4* 6.0* 5.9* 6.4*  ALBUMIN 1.6* 1.6* 1.9* 2.0* 2.1*   Recent Labs  Lab 01/21/18 0336 01/22/18 0528 01/26/18 0425  LIPASE 51 46 48   Recent Labs  Lab 01/26/18 1314  AMMONIA 36*    Coagulation Profile: No results for input(s): INR, PROTIME in the last 168 hours.  Cardiac Enzymes: No results for input(s): CKTOTAL, CKMB, CKMBINDEX, TROPONINI in the last 168 hours.  BNP (last 3 results) No results for input(s): PROBNP in the last 8760 hours.  HbA1C: No results for input(s): HGBA1C in the last 72 hours.  CBG: Recent Labs  Lab 01/23/18 2322  01/24/18 0339 01/24/18 0734 01/24/18 1143 01/24/18 1625  GLUCAP 77 98 98 85 104*    Lipid Profile: No results for input(s): CHOL, HDL, LDLCALC, TRIG, CHOLHDL, LDLDIRECT in the last 72 hours.  Thyroid Function Tests: Recent Labs    01/26/18 0425  TSH 3.189    Anemia Panel: Recent Labs    01/26/18 0425  VITAMINB12 396  FOLATE 12.6  FERRITIN 761*  TIBC 217*  IRON 35  RETICCTPCT 1.3    Urine analysis:    Component Value Date/Time   COLORURINE STRAW (A) 01/21/2018 1828   APPEARANCEUR CLEAR 01/21/2018 1828   LABSPEC 1.006 01/21/2018 1828   PHURINE 5.0 01/21/2018 1828   GLUCOSEU NEGATIVE 01/21/2018 1828   HGBUR MODERATE (A) 01/21/2018 1828   BILIRUBINUR NEGATIVE 01/21/2018 1828   KETONESUR NEGATIVE 01/21/2018 1828   PROTEINUR NEGATIVE 01/21/2018 1828   UROBILINOGEN 0.2 10/07/2015 1958   NITRITE NEGATIVE 01/21/2018 1828   LEUKOCYTESUR NEGATIVE 01/21/2018 1828    Sepsis Labs: Lactic Acid, Venous    Component Value Date/Time   LATICACIDVEN 2.3 (HH) 01/21/2018 1828    MICROBIOLOGY: Recent Results (from the past 240 hour(s))  Culture, Urine     Status: None   Collection Time: 01/18/18  9:32 AM  Result Value Ref Range Status   Specimen Description URINE, RANDOM  Final   Special Requests NONE  Final   Culture   Final    NO GROWTH Performed at [facility], [address], [place], [place] [address]    Report Status 01/19/2018 FINAL  Final  Culture, blood (single)     Status: None   Collection Time: 01/21/18  7:55 PM  Result Value Ref Range Status   Specimen Description BLOOD HAND LEFT  Final   Special Requests   Final    BOTTLES DRAWN AEROBIC AND ANAEROBIC Blood Culture results may not be optimal due to an excessive volume of blood received in culture bottles   Culture   Final    NO GROWTH 5 DAYS Performed at [facility], [address] [place], [place] [address]    Report Status 01/26/2018 FINAL  Final  C difficile quick scan w PCR reflex      Status: Abnormal   Collection Time: 01/24/18 11:36 AM  Result Value Ref Range Status   C Diff antigen POSITIVE (A) NEGATIVE Final   C Diff toxin NEGATIVE  NEGATIVE Final   C Diff interpretation Results are indeterminate. See PCR results.  Final    Comment: Performed at [facility], [address] [place], [place] [address]  C. Diff by PCR, Reflexed     Status: None   Collection Time: 01/24/18 11:36 AM  Result Value Ref Range Status   Toxigenic C. Difficile by PCR NEGATIVE NEGATIVE Final    Comment: Patient is colonized with non toxigenic C. difficile. May not need treatment unless significant symptoms are present. Performed at [facility], [address] [place], [place] [address]     RADIOLOGY STUDIES/RESULTS: Ct Abdomen Pelvis Wo Contrast  Result Date: 01/20/2018 CLINICAL DATA:  History of pancreatitis EXAM: CT ABDOMEN AND PELVIS WITHOUT CONTRAST TECHNIQUE: Multidetector CT imaging of the abdomen and pelvis was performed following the standard protocol without IV contrast. COMPARISON:  01/16/2018 FINDINGS: Lower chest: Moderate to large bilateral pleural effusions are seen these are similar to a CT of the chest from the previous day but increased when compared with the prior CT examination. Bilateral lower lobe consolidation is noted as well. Hepatobiliary: Liver and gallbladder are within normal limits as visualized. Pancreas: The pancreas again demonstrates significant enlargement with peripancreatic inflammatory change in consistent with known pancreatitis. Lack of IV contrast somewhat limits evaluation of the gland itself. Spleen: Spleen is within normal limits. Adrenals/Urinary Tract: The adrenal glands and kidneys show no focal abnormality. No calculi or obstructive changes are seen. The bladder is decompressed by Foley catheter. Stomach/Bowel: Nasogastric catheter is noted within the stomach. Some thickening of the colonic wall is noted likely reactive to the free fluid  within the abdomen. The appendix appears within normal limits. No obstructive changes are seen. Vascular/Lymphatic: No significant vascular findings are present. No enlarged abdominal or pelvic lymph nodes. Reproductive: Uterus and bilateral adnexa are unremarkable. Other: Free fluid is noted within the pelvis and to a lesser degree in the upper abdomen this has increased somewhat in the interval from prior exam. Musculoskeletal: No acute or significant osseous findings. IMPRESSION: Changes of acute pancreatitis relatively stable when compared with the prior exam. Considerable peripancreatic fluid as well as free fluid in the abdomen is seen. Wall thickening within the colon as described likely of a reactive nature given the free fluid within the abdomen. Bilateral pleural effusions and lower lobe consolidation new when compared with the prior CT of the abdomen. These changes have been previously described on CT of the chest. Electronically Signed   By: Alcide CleverMark  Lukens M.D.   On: 01/20/2018 13:53   Ct Abdomen Pelvis Wo Contrast  Result Date: 01/16/2018 CLINICAL DATA:  Abdominal pain radiating to back EXAM: CT ABDOMEN AND PELVIS WITHOUT CONTRAST TECHNIQUE: Multidetector CT imaging of the abdomen and pelvis was performed following the standard protocol without oral or IV contrast. COMPARISON:  None. FINDINGS: Lower chest: Lung bases are clear. Hepatobiliary: There is a patent steatosis. No focal liver lesions are appreciable. Gallbladder wall is not appreciably thickened. There is suspected sludge in gallbladder. There is no biliary duct dilatation. Pancreas: There is mild generalized prominence of the pancreas with what is felt to be a degree edema throughout the pancreas. There is surrounding peripancreatic fluid. Fluid tracks on the right laterally into the right lateral conal fascia region. There is loculated ascites throughout the anterior abdominal region with mesenteric thickening throughout the anterolateral  abdominal wall regions extending inferior to the level of the pelvis. There is moderate ascites in the dependent portion of the  pelvis as well which may partially loculated. Spleen: No splenic lesions are evident. Adrenals/Urinary Tract: Adrenals bilaterally appear unremarkable. Kidneys bilaterally show no evident mass or hydronephrosis on either side. There is no renal or ureteral calculus on either side. Urinary bladder is midline with wall thickness within normal limits. Stomach/Bowel: There is mild wall thickening in multiple loops of small bowel, likely due to enteritis secondary to the pancreatitis. Similar changes are noted in the ascending and descending colon. There is no evident bowel obstruction. No free air or portal venous air. Vascular/Lymphatic: There is no abdominal aortic aneurysm. No vascular lesions are evident. There is no appreciable adenopathy in the abdomen or pelvis. Reproductive: Uterus is anteverted.  No evident pelvic mass. Other: Appendix appears normal. There is no evident abscess in the abdomen or pelvis. Musculoskeletal: No blastic or lytic bone lesions. No intramuscular or abdominal wall lesions are evident. IMPRESSION: 1. Findings indicative of acute pancreatitis. Pancreas appears edematous with peripancreatic fluid. There is fluid tracking throughout the anterolateral abdominal regions with mesenteric thickening in these areas felt to be due to pancreatitis. There is localized partially loculated ascites in the dependent portion of the pelvis. 2. Wall thickening involving multiple loops of small and large bowel, likely due to enterocolitis secondary to inflammation from the peripancreatic fluid/pancreatitis. No bowel obstruction. 3. No frank abscess in the abdomen or pelvis. Appendix appears normal. 4.  No renal or ureteral calculus.  No hydronephrosis. 5. Question sludge in gallbladder. Gallbladder wall does not appear appreciably thickened. 6.  Hepatic steatosis. Electronically  Signed   By: Bretta Bang III M.D.   On: 01/16/2018 18:08   Ct Head Wo Contrast  Result Date: 01/18/2018 CLINICAL DATA:  Altered mental status. EXAM: CT HEAD WITHOUT CONTRAST TECHNIQUE: Contiguous axial images were obtained from the base of the skull through the vertex without intravenous contrast. COMPARISON:  CT scan of December 07, 2015. FINDINGS: Brain: No evidence of acute infarction, hemorrhage, hydrocephalus, extra-axial collection or mass lesion/mass effect. Vascular: No hyperdense vessel or unexpected calcification. Skull: Normal. Negative for fracture or focal lesion. Sinuses/Orbits: Probable mild sphenoid sinusitis. Other: None. IMPRESSION: Normal head CT. Electronically Signed   By: Lupita Raider, M.D.   On: 01/18/2018 18:35   Ct Chest Wo Contrast  Result Date: 01/19/2018 CLINICAL DATA:  Follow-up pleural effusion EXAM: CT CHEST WITHOUT CONTRAST TECHNIQUE: Multidetector CT imaging of the chest was performed following the standard protocol without IV contrast. COMPARISON:  Chest x-ray from earlier in the same day FINDINGS: Cardiovascular: Somewhat limited due to the lack of IV contrast. No cardiac enlargement is noted. The thoracic aorta is within normal limits. Left subclavian central line is noted in the mid superior vena cava. Mediastinum/Nodes: Endotracheal tube and nasogastric catheter are noted. The thoracic inlet is within normal limits. No definitive hilar or mediastinal adenopathy is noted although again somewhat limited evaluation due to the lack of IV contrast. Lungs/Pleura: Bilateral lower lobe infiltrate is noted left greater than right with associated moderate effusions left greater than right. Additionally cough left upper lobe infiltrate is noted. These are similar to that seen on recent chest x-ray and again worsened when compared with previous exams. Upper Abdomen: Within normal limits. Musculoskeletal: Within normal limits. IMPRESSION: Patchy multifocal infiltrates left  greater than right with associated moderate effusions left greater than right. Electronically Signed   By: Alcide Clever M.D.   On: 01/19/2018 12:29   US Abdomen Complete  Result Date: 01/18/2018 CLINICAL DATA:  Pancreatitis.  Abdominal distension  EXAM: ABDOMEN ULTRASOUND COMPLETE COMPARISON:  CT abdomen and pelvis Jan 16, 2018 FINDINGS: Gallbladder: No gallstones are appreciable. There is mild sludge in gallbladder. Gallbladder wall does not appear thickened. There is slight pericholecystic fluid, however. No sonographic Murphy sign noted by sonographer. Common bile duct: Diameter: 6 mm. No intrahepatic, common hepatic, or common bile duct dilatation. Liver: No focal lesion identified. Liver echogenicity is increased. Portal vein is patent on color Doppler imaging with normal direction of blood flow towards the liver. IVC: No abnormality visualized. Pancreas: Pancreas is enlarged and edematous. The echotexture of the pancreas is inhomogeneous. There is slight peripancreatic fluid. No evident pancreatic mass or pseudocyst is seen by ultrasound. Spleen: Size and appearance within normal limits. Right Kidney: Length: 11.7 cm. Echogenicity within normal limits. No mass or hydronephrosis visualized. Left Kidney: Length: 11.5 cm. Echogenicity within normal limits. No mass or hydronephrosis visualized. Abdominal aorta: No aneurysm visualized. Other findings: There is a mild degree of ascites. IMPRESSION: 1. Changes of acute pancreatitis. No well-defined pancreatic mass or pseudocyst. 2. Slight pericholecystic fluid. Gallbladder wall is not appear appreciably thickened. There is mild sludge in gallbladder and gallstones. Possibility of a degree of acalculus cholecystitis is of concern. This finding may warrant nuclear medicine hepatobiliary imaging study to assess for cystic duct patency. 3. Increase in liver echogenicity, a finding most likely indicative of hepatic steatosis. While no focal liver lesions are evident,  it must be cautioned that the sensitivity of ultrasound for detection of focal liver lesions is diminished in this circumstance. 4.  Slight ascites. Electronically Signed   By: Bretta Bang III M.D.   On: 01/18/2018 11:03   Dg Chest Port 1 View  Result Date: 01/27/2018 CLINICAL DATA:  Shortness of breath. EXAM: PORTABLE CHEST 1 VIEW COMPARISON:  01/25/2018 FINDINGS: Feeding tube and subclavian central line have been removed. The right chest is clear. There is persistent left lower lobe atelectasis with a left effusion. Aeration may be slightly improved. IMPRESSION: Slight radiographic improvement. Left effusion with left lower lobe atelectasis. Electronically Signed   By: Paulina Fusi M.D.   On: 01/27/2018 09:44   Dg Chest Port 1 View  Result Date: 01/25/2018 CLINICAL DATA:  Status postextubation.  Pleural effusion. EXAM: PORTABLE CHEST 1 VIEW COMPARISON:  Jan 22, 2018 FINDINGS: There is airspace consolidation in the left lower lobe with moderate left pleural effusion, stable. Right lung is clear. Heart is mildly enlarged with pulmonary vascularity normal. Endotracheal tube is been removed. No pneumothorax. Central catheter tip is in the superior vena cava. Feeding tube tip is below the diaphragm. IMPRESSION: No pneumothorax. Tube and catheter positions as described. Persistent left lower lobe consolidation with moderate left pleural effusion. Right lung clear. Stable cardiac silhouette. Electronically Signed   By: Bretta Bang III M.D.   On: 01/25/2018 07:20   Dg Chest Port 1 View  Result Date: 01/22/2018 CLINICAL DATA:  27 year old female with acute pancreatitis. Pleural effusions and pneumonia. Intubated. EXAM: PORTABLE CHEST 1 VIEW COMPARISON:  01/21/2018 and earlier. FINDINGS: Portable AP semi upright view at 0546 hours. Endotracheal tube tip in good position at the level the clavicles. Stable enteric tube, side hole up the level of the mid stomach. Stable left subclavian central line.  Continued confluent in veiling opacity throughout the left lung, sparing the apex. Mediastinal contours remain normal. Decreased veiling opacity in the right lung, which now appears clear allowing for portable technique. No pneumothorax. Mild gaseous distension of the stomach but otherwise paucity of upper abdominal  bowel gas. No acute osseous abnormality identified. IMPRESSION: 1. Stable lines and tubes. 2. Continued left pleural effusion with atelectasis and/or pneumonia. 3. Improved right lung ventilation, regressed right pleural effusion. 4. No new cardiopulmonary abnormality. Electronically Signed   By: Odessa Fleming M.D.   On: 01/22/2018 08:51   Dg Chest Port 1 View  Result Date: 01/21/2018 CLINICAL DATA:  Intubation. EXAM: PORTABLE CHEST 1 VIEW COMPARISON:  CT 01/19/2018.  Chest x-ray 01/19/2018. FINDINGS: Endotracheal tube, NG tube, left subclavian line stable position. Heart size stable. Diffuse bilateral pulmonary infiltrates/edema, progressed from prior exam. Persistent left-sided pleural effusion. No pneumothorax. IMPRESSION: 1.  Lines and tubes in stable position. 2. Progressive bilateral pulmonary infiltrates/edema. Persistent left pleural effusion. Electronically Signed   By: Maisie Fus  Register   On: 01/21/2018 08:38   Dg Chest Port 1 View  Result Date: 01/19/2018 CLINICAL DATA:  Check endotracheal tube placement EXAM: PORTABLE CHEST 1 VIEW COMPARISON:  01/18/2018 FINDINGS: Cardiac shadow is stable. Left-sided pleural effusion is again noted and slightly increased when compared with the prior exam. Increasing consolidation in the left base is noted as well. Endotracheal tube and nasogastric catheter are again seen in satisfactory position. Left subclavian central line is noted as well. The right lung remains clear. IMPRESSION: Increase in left basilar consolidation and left pleural effusion. Electronically Signed   By: Alcide Clever M.D.   On: 01/19/2018 08:51   Dg Chest Port 1 View  Result Date:  01/18/2018 CLINICAL DATA:  Followup for respiratory failure. EXAM: PORTABLE CHEST 1 VIEW COMPARISON:  01/17/2018 and older exams. FINDINGS: Left pleural effusion which has increased in size from the previous day's study. There is persistent left lung base opacity consistent with atelectasis or possibly pneumonia. Right lung remains clear. No right pleural effusion.  No pneumothorax. Endotracheal tube, left subclavian central venous line and nasal/orogastric tube are stable and well positioned. IMPRESSION: 1. Mild interval worsening of lung aeration on the left with an increase in left pleural fluid. There is persistent left lung base opacity consistent with pneumonia or atelectasis. 2. No other change. Right lung remains clear. Support apparatus is stable and well positioned. Electronically Signed   By: Amie Portland M.D.   On: 01/18/2018 08:55   Dg Chest Portable 1 View  Result Date: 01/17/2018 CLINICAL DATA:  Post intubation EXAM: PORTABLE CHEST 1 VIEW COMPARISON:  05/02/2015 FINDINGS: An endotracheal tube has been placed with tip measuring 1.8 cm above the carina. Enteric tube tip is off the field of view but below the left hemidiaphragm. Shallow inspiration. Heart size and pulmonary vascularity are normal. Lungs are clear and expanded. Mediastinal contours appear intact. Thoracic scoliosis convex towards the right. IMPRESSION: Appliances appear in satisfactory position. No active pulmonary disease. Electronically Signed   By: Burman Nieves M.D.   On: 01/17/2018 06:25   Dg Chest Port 1v Same Day  Result Date: 01/17/2018 CLINICAL DATA:  Displacement of central venous catheter. EXAM: PORTABLE CHEST 1 VIEW COMPARISON:  01/17/2018 at 6:08 a.m. FINDINGS: Since prior exam, a left subclavian central venous line has been placed. The tip projects at the caval atrial junction. No pneumothorax. The endotracheal tube and the nasal/orogastric tube are stable and well positioned. There is new left lung base opacity  mostly silhouetting the left hemidiaphragm. This may reflect atelectasis or a rapidly evolving infection. Atelectasis is favored. IMPRESSION: 1. New left subclavian central venous line is well positioned, tip at the caval atrial junction. No pneumothorax. 2. New left lung base opacity, most  likely atelectasis. Consider pneumonia if there are consistent clinical findings. Electronically Signed   By: Amie Portland M.D.   On: 01/17/2018 12:27   Dg Abd Portable 1v  Result Date: 01/23/2018 CLINICAL DATA:  Evaluate NG tube placement EXAM: PORTABLE ABDOMEN - 1 VIEW COMPARISON:  CT scan Jan 20, 2018 FINDINGS: The feeding tube terminates in the left abdomen, likely in the proximal jejunum. Air-filled mildly prominent loops of small bowel in the abdomen measure up to 3.4 cm. IMPRESSION: 1. The feeding tube is in good position. 2. Air-filled mildly prominent/dilated loops of small bowel. This could represent developing ileus or small-bowel obstruction. Electronically Signed   By: Gerome Sam III M.D   On: 01/23/2018 11:27     LOS: 11 days   Jeoffrey Massed, MD  [facility]  If 7PM-7AM, please contact night-coverage  Please page via [email or web address]-click on MD name and type text message  01/27/2018, 2:45 PM

## 2018-01-27 NOTE — Progress Notes (Signed)
Physical Therapy Treatment Patient Details Name: Donna Short MRN: [id number] DOB: 19-Oct-1990 Today's Date: 01/27/2018    History of Present Illness 27 yr old female with PMHx HTN, Asthma, Seizures and known ETOH abuse presented 5/25 with abdominal pain, nausea, vomiting. CTA/P shows pancreatitis. HTN and Tachycardic last ETOH was on 5/21, now s/p seizure, intubated 5/26-5/31/19.     PT Comments    Patient is progressing well toward PT goals and tolerated session well. Current plan remains appropriate.    Follow Up Recommendations  Home health PT;Supervision for mobility/OOB     Equipment Recommendations  3in1 (PT)    Recommendations for Other Services       Precautions / Restrictions Precautions Precautions: Fall Restrictions Weight Bearing Restrictions: No    Mobility  Bed Mobility Overal bed mobility: Modified Independent Bed Mobility: Supine to Sit;Sit to Supine           General bed mobility comments: increased time and HOB elevated  Transfers Overall transfer level: Needs assistance Equipment used: None Transfers: Sit to/from Stand Sit to Stand: Supervision         General transfer comment: supervision for safety  Ambulation/Gait Ambulation/Gait assistance: Supervision;Min guard Ambulation Distance (Feet): 500 Feet Assistive device: None Gait Pattern/deviations: Step-through pattern;Decreased stride length     General Gait Details: decreased cadence and guarded movements; grossly steady gait without AD and no LOB   Stairs Stairs: Yes Stairs assistance: Min guard Stair Management: One rail Right;Step to pattern;Sideways;Forwards Number of Stairs: 10 General stair comments: cues for step to pattern; increased abdominal pain with stair negotiation   Wheelchair Mobility    Modified Rankin (Stroke Patients Only)       Balance Overall balance assessment: Needs assistance Sitting-balance support: Feet supported;Bilateral upper extremity  supported Sitting balance-Leahy Scale: Good     Standing balance support: No upper extremity supported;During functional activity Standing balance-Leahy Scale: Fair                              Cognition Arousal/Alertness: Awake/alert Behavior During Therapy: WFL for tasks assessed/performed Overall Cognitive Status: Within Functional Limits for tasks assessed                                        Exercises      General Comments        Pertinent Vitals/Pain Pain Assessment: Faces Faces Pain Scale: Hurts little more Pain Location: abdomen with mobility Pain Descriptors / Indicators: Guarding;Sore Pain Intervention(s): Limited activity within patient's tolerance;Repositioned    Home Living                      Prior Function            PT Goals (current goals can now be found in the care plan section) Acute Rehab PT Goals Patient Stated Goal: Return to PLOF Progress towards PT goals: Progressing toward goals    Frequency    Min 3X/week      PT Plan Current plan remains appropriate    Co-evaluation              AM-PAC PT "6 Clicks" Daily Activity  Outcome Measure  Difficulty turning over in bed (including adjusting bedclothes, sheets and blankets)?: None Difficulty moving from lying on back to sitting on the side of the bed? : A Lot  Difficulty sitting down on and standing up from a chair with arms (e.g., wheelchair, bedside commode, etc,.)?: A Lot Help needed moving to and from a bed to chair (including a wheelchair)?: A Little Help needed walking in hospital room?: A Little Help needed climbing 3-5 steps with a railing? : A Little 6 Click Score: 17    End of Session Equipment Utilized During Treatment: Gait belt Activity Tolerance: Patient tolerated treatment well;Patient limited by fatigue Patient left: with call bell/phone within reach;in bed;with family/visitor present Nurse Communication: Mobility  status PT Visit Diagnosis: Unsteadiness on feet (R26.81);Muscle weakness (generalized) (M62.81);Pain;Difficulty in walking, not elsewhere classified (R26.2)     Time: 1450-1505 PT Time Calculation (min) (ACUTE ONLY): 15 min  Charges:  $Gait Training: 8-22 mins                    G Codes:       Donna Short, PTA Pager: [phone]     Donna Short 01/27/2018, 4:01 PM

## 2018-01-27 NOTE — Progress Notes (Signed)
PT Cancellation Note  Patient Details Name: Donna Short MRN: [id number] DOB: 1991/02/08   Cancelled Treatment:    Reason Eval/Treat Not Completed: Patient declined, no reason specified Patient declined participating in therapy at this time. Pt reports she wants to rest before her family comes to visit. PT will continue to follow acutely.    Derek MoundKellyn R Thailan Sava Lovelace Cerveny, PTA Pager: [phone]   01/27/2018, 10:27 AM

## 2018-01-28 LAB — CBC
HCT: 23.3 % — ABNORMAL LOW (ref 36.0–46.0)
Hemoglobin: 7.7 g/dL — ABNORMAL LOW (ref 12.0–15.0)
MCH: 37.4 pg — ABNORMAL HIGH (ref 26.0–34.0)
MCHC: 33 g/dL (ref 30.0–36.0)
MCV: 113.1 fL — ABNORMAL HIGH (ref 78.0–100.0)
PLATELETS: 424 10*3/uL — AB (ref 150–400)
RBC: 2.06 MIL/uL — AB (ref 3.87–5.11)
RDW: 13.1 % (ref 11.5–15.5)
WBC: 12.9 10*3/uL — AB (ref 4.0–10.5)

## 2018-01-28 LAB — BASIC METABOLIC PANEL
ANION GAP: 14 (ref 5–15)
BUN: 8 mg/dL (ref 6–20)
CALCIUM: 9.1 mg/dL (ref 8.9–10.3)
CO2: 22 mmol/L (ref 22–32)
Chloride: 103 mmol/L (ref 101–111)
Creatinine, Ser: 0.91 mg/dL (ref 0.44–1.00)
Glucose, Bld: 80 mg/dL (ref 65–99)
Potassium: 3.3 mmol/L — ABNORMAL LOW (ref 3.5–5.1)
SODIUM: 139 mmol/L (ref 135–145)

## 2018-01-28 MED ORDER — POTASSIUM CHLORIDE CRYS ER 20 MEQ PO TBCR
40.0000 meq | EXTENDED_RELEASE_TABLET | Freq: Once | ORAL | Status: AC
  Administered 2018-01-28: 40 meq via ORAL
  Filled 2018-01-28: qty 2

## 2018-01-28 NOTE — Progress Notes (Signed)
  Speech Language Pathology Treatment: Dysphagia  Patient Details Name: Donna Short MRN: [id number] DOB: October 05, 1990 Today's Date: 01/28/2018 Time: 0923-3007 SLP Time Calculation (min) (ACUTE ONLY): 17 min  Assessment / Plan / Recommendation Clinical Impression  Pt's oropharyngeal function appears appropriate taking pills with thin liquids and chewing on her icee. She denies any difficulties with swallowing although she did have regurgitation trying to eat a graham cracker today (MD advanced diet to soft solids). Pt would be appropriate for any textures as medically appropriate. Will defer additional diet adjustments to MD. SLP to sign off acutely. Her mentation is improving but she would likely benefit from additional cognitive f/u upon discharge to maximize safety.   HPI HPI: 27 yr old female with PMHx HTN, Asthma, Seizures and known ETOH abuse presented 5/25 with abdominal pain, nausea, vomiting. CTA/P shows pancreatitis. HTN and Tachycardic last ETOH was on 5/21, now s/p seizure, intubated 5/26-5/31/19.       SLP Plan  All goals met       Recommendations  Diet recommendations: Other(comment)(advancement as medically appropriate) Liquids provided via: Cup;Straw Medication Administration: Whole meds with liquid Supervision: Patient able to self feed;Intermittent supervision to cue for compensatory strategies Compensations: Minimize environmental distractions;Slow rate;Small sips/bites;Follow solids with liquid Postural Changes and/or Swallow Maneuvers: Seated upright 90 degrees;Upright 30-60 min after meal                Oral Care Recommendations: Oral care BID Follow up Recommendations: Other (comment)(would benefit from further cognitive assessment) SLP Visit Diagnosis: Dysphagia, unspecified (R13.10) Plan: All goals met       GO                Germain Osgood 01/28/2018, 11:12 AM  Germain Osgood, M.A. CCC-SLP [phone]

## 2018-01-28 NOTE — Care Management Note (Signed)
Case Management Note [name],[name] Case Manager  [phone]  Patient Details  Name: Donna Short MRN: [id number] Date of Birth: 09/01/1990  Subjective/Objective:  Pt admitted with acute pancreatitis, seizure secondary to ETOH w/d with need for intubation and vent support-  Extubated 5/31                 Action/Plan: PTA Pt lived at home alone,  Per PT/OT evals recommendation for CIR, consult has been placed- CIR following for possible admission, CSW and CM will follow - CM did receive call from patients insurance provided on 6/4 regarding transition of care needs- will need to use [facility] for any HH/DME needs for approval- phone # [phone] fax [phone], CM at [name] is [name]- contact # [phone]   Expected Discharge Date:                  Expected Discharge Plan:  Home w Home Health Services  In-House Referral:  Clinical Social Work  Discharge planning Services  CM Consult  Post Acute Care Choice:    Choice offered to:     DME Arranged:    DME Agency:     HH Arranged:  PT HH Agency:  Other - See comment  Status of Service:  In process, will continue to follow  If discussed at Long Length of Stay Meetings, dates discussed:    Discharge Disposition:   Additional Comments:  01/28/18 [name], RN, [name] Referral made to [facility] for HHPT, as ordered by MD.  Phoned in referral and faxed orders and clinical information to [phone], per request.  Intake # [id number].  HHPT will be set up by payor, and pt will be contacted with provider information when complete.  Plan start of care for 01/30/18.  Pt has no PCP; will have to call [phone] number on back of insurance card for PCP information, or call [facility] physician referral service for assistance to set up PCP.  I will provide [facility] information on AVS inEPIC.    [name], RN, [name]  Trauma/Neuro ICU Case Manager [phone]

## 2018-01-28 NOTE — Progress Notes (Signed)
PROGRESS NOTE        PATIENT DETAILS Name: Donna CurlsShamika M Short Age: 27 y.o. Sex: female Date of Birth: 05-27-1991 Admit Date: 01/16/2018 Admitting Physician Carin HockKristen D Scatliffe, MD ZOX:[id number]:Patient, No Pcp Per  Brief Narrative: Patient is a 27 y.o. female with long-standing history of alcohol abuse, seizure disorder, asthma presented with pancreatitis, hospital course complicated by development of acute encephalopathy, possible alcohol withdrawal seizure, bilateral pulmonary infiltrates-highly suspicious for probable aspiration pneumonia-she was intubated and managed in the intensive care unit.  Upon stability she was transferred to the trial hospitalist service.  See below for further details  Significant Events: 5/25 admit through ED  5/26 clinical decline - PCCM consulted > intubated + pressors  5/31 extubated   Subjective: Abdominal pain has markedly improved-had a bowel movement this morning.  Easily tolerating full liquids.    Assessment/Plan: Acute pancreatitis: Much improved-hardly any abdominal tenderness this morning-advance to soft diet.  Etiology of pancreatitis likely secondary to alcohol use.  Have counseled extensively-he claims she has every intention of quitting EtOH use.   Acute metabolic encephalopathy: Resolved, she is completely awake and alert.  CT head negative for acute process.  Acute hypoxic respiratory failure with bilateral pulmonary infiltrates-suspected aspiration pneumonia: Improved-no longer on oxygen.  Has completed a course of antimicrobial therapy.  All cultures continue to be negative.  She is afebrile, leukocytosis has almost resolved.    Alcohol withdrawal: Out of the window for alcohol withdrawal-managed with Precedex infusion and CIWA protocol while in the ICU.  Counseled extensively.  Elevated troponin: Likely demand ischemia-TTE showed EF around 55%-doubt further work-up is required.  Troponin trend was flat and not consistent  with ACS.  Seizure disorder: Although suspected to have a alcohol withdrawal seizure-she does apparently have a history of seizure disorder-she used to follow with neurology in the past.  She unfortunately noncompliant-stop taking antiseizure medications and stopped following up with neurology.  Plans are to continue with Keppra on discharge and follow-up with neurology as outpatient.    Hypertension/tachycardia: Controlled with labetalol and clonidine.  Anemia: Likely secondary to acute illness-no evidence of blood loss-hemoglobin stable at this point.  Follow periodically  Thrombocytopenia: Resolved  Hypokalemia: Replete and recheck  Dysphagia: Suspect this was related to confusion/ICU illness when she was critically ill-appreciate speech therapy input-she has markedly improved-we will upgrade to a soft diet today  Protein calorie malnutrition following critical illness: Continue supplements  DVT Prophylaxis: Prophylactic Lovenox   Code Status: Full code  Family Communication: None at bedside  Disposition Plan: Remain inpatient-home soon-either later today or tomorrow morning  Antimicrobial agents: Anti-infectives (From admission, onward)   Start     Dose/Rate Route Frequency Ordered Stop   01/24/18 2200  vancomycin (VANCOCIN) IVPB 750 mg/150 ml premix  Status:  Discontinued     750 mg 150 mL/hr over 60 Minutes Intravenous Every 12 hours 01/24/18 1047 01/26/18 1332   01/24/18 1800  vancomycin (VANCOCIN) 50 mg/mL oral solution 125 mg  Status:  Discontinued     125 mg Oral 4 times daily 01/24/18 1605 01/26/18 1331   01/22/18 1800  vancomycin (VANCOCIN) IVPB 750 mg/150 ml premix  Status:  Discontinued     750 mg 150 mL/hr over 60 Minutes Intravenous Every 8 hours 01/22/18 1143 01/24/18 1047   01/22/18 1145  vancomycin (VANCOCIN) 1,250 mg in sodium chloride 0.9 % 250  mL IVPB     1,250 mg 166.7 mL/hr over 90 Minutes Intravenous  Once 01/22/18 1143 01/22/18 1325   01/16/18 2200   piperacillin-tazobactam (ZOSYN) IVPB 3.375 g  Status:  Discontinued     3.375 g 12.5 mL/hr over 240 Minutes Intravenous Every 8 hours 01/16/18 1644 01/26/18 1355   01/16/18 1645  piperacillin-tazobactam (ZOSYN) IVPB 3.375 g     3.375 g 100 mL/hr over 30 Minutes Intravenous  Once 01/16/18 1637 01/16/18 1823      Procedures: ETT 5/26 >> 5/31 L Early CVC 5/26>> R Art Line  5/26 > 5/31 R EJ >5/25  CONSULTS:  pulmonary/intensive care, nephrology and general surgery  Time spent: 25 minutes-Greater than 50% of this time was spent in counseling, explanation of diagnosis, planning of further management, and coordination of care.  MEDICATIONS: Scheduled Meds: . cloNIDine  0.1 mg Transdermal Weekly  . enoxaparin (LOVENOX) injection  40 mg Subcutaneous Q24H  . feeding supplement (ENSURE ENLIVE)  237 mL Oral TID BM  . folic acid  1 mg Oral Daily  . labetalol  200 mg Oral TID  . levETIRAcetam  500 mg Oral BID  . sodium chloride flush  10-40 mL Intracatheter Q12H  . thiamine  100 mg Oral Daily   Continuous Infusions: PRN Meds:.acetaminophen, loperamide, ondansetron, sodium chloride flush, traMADol   PHYSICAL EXAM: Vital signs: Vitals:   01/27/18 1347 01/27/18 2111 01/28/18 0549 01/28/18 1017  BP: 115/87 (!) 139/102 (!) 126/98   Pulse: 95 100 91 79  Resp: 18 18 17    Temp: 99 F (37.2 C) 99.4 F (37.4 C) 99.5 F (37.5 C)   TempSrc: Oral Oral Oral   SpO2: 100% 100% 100%   Weight:      Height:       Filed Weights   01/25/18 0500 01/26/18 0600 01/26/18 1537  Weight: 60.9 kg (134 lb 4.2 oz) 62.3 kg (137 lb 5.6 oz) 57 kg (125 lb 10.6 oz)   Body mass index is 22.98 kg/m.   General appearance:Awake, alert, not in any distress.  Eyes:no scleral icterus. HEENT: Atraumatic and Normocephalic Neck: supple, no JVD. Resp:Good air entry bilaterally,no rales or rhonchi CVS: S1 S2 regular, no murmurs.  GI: Bowel sounds present, hardly any tenderness in the epigastric area today.   Nondistended. Extremities: B/L Lower Ext shows no edema, both legs are warm to touch Neurology:  Non focal Psychiatric: Normal judgment and insight. Normal mood. Musculoskeletal:No digital cyanosis Skin:No Rash, warm and dry Wounds:N/A  I have personally reviewed following labs and imaging studies  LABORATORY DATA: CBC: Recent Labs  Lab 01/22/18 0528  01/24/18 0648 01/25/18 0506 01/26/18 0425 01/27/18 0612 01/28/18 0612  WBC 21.1*   < > 22.3* 20.7* 18.6* 15.1* 12.9*  NEUTROABS 16.9*  --  18.4* 17.4*  --   --   --   HGB 8.1*   < > 8.3* 7.5* 7.3* 7.7* 7.7*  HCT 24.4*   < > 24.6* 22.5* 21.6* 23.5* 23.3*  MCV 113.0*   < > 109.8* 112.5* 110.8* 110.3* 113.1*  PLT 101*   < > 189 244 318 396 424*   < > = values in this interval not displayed.    Basic Metabolic Panel: Recent Labs  Lab 01/22/18 0528 01/23/18 0537  01/24/18 9604 01/24/18 1644 01/25/18 0506 01/26/18 0425 01/27/18 0612 01/28/18 0612  NA 145 143   < >  --  144 144 143 139 139  K 3.4* 2.8*   < >  --  3.8 3.7 3.1* 3.3* 3.3*  CL 108 100*   < >  --  105 106 103 101 103  CO2 29 32   < >  --  29 29 24 25 22   GLUCOSE 155* 112*   < >  --  108* 96 91 93 80  BUN 17 16   < >  --  16 17 16 10 8   CREATININE 0.89 0.75   < >  --  0.87 0.99 1.15* 0.98 0.91  CALCIUM 8.2* 8.2*   < >  --  8.5* 8.6* 8.8* 9.3 9.1  MG 1.2* 1.1*  --  2.3  --  1.9 1.7 2.0  --   PHOS 4.4 3.9  --  4.0  --  4.8* 5.2*  --   --    < > = values in this interval not displayed.    GFR: Estimated Creatinine Clearance: 72.1 mL/min (by C-G formula based on SCr of 0.91 mg/dL).  Liver Function Tests: Recent Labs  Lab 01/22/18 0528 01/24/18 0648 01/25/18 0506 01/26/18 0425  AST 71* 32 34 33  ALT 22 19 16 15   ALKPHOS 79 72 53 47  BILITOT 0.7 1.0 0.7 0.9  PROT 5.4* 6.0* 5.9* 6.4*  ALBUMIN 1.6* 1.9* 2.0* 2.1*   Recent Labs  Lab 01/22/18 0528 01/26/18 0425  LIPASE 46 48   Recent Labs  Lab 01/26/18 1314  AMMONIA 36*    Coagulation  Profile: No results for input(s): INR, PROTIME in the last 168 hours.  Cardiac Enzymes: No results for input(s): CKTOTAL, CKMB, CKMBINDEX, TROPONINI in the last 168 hours.  BNP (last 3 results) No results for input(s): PROBNP in the last 8760 hours.  HbA1C: No results for input(s): HGBA1C in the last 72 hours.  CBG: Recent Labs  Lab 01/23/18 2322 01/24/18 0339 01/24/18 0734 01/24/18 1143 01/24/18 1625  GLUCAP 77 98 98 85 104*    Lipid Profile: No results for input(s): CHOL, HDL, LDLCALC, TRIG, CHOLHDL, LDLDIRECT in the last 72 hours.  Thyroid Function Tests: Recent Labs    01/26/18 0425  TSH 3.189    Anemia Panel: Recent Labs    01/26/18 0425  VITAMINB12 396  FOLATE 12.6  FERRITIN 761*  TIBC 217*  IRON 35  RETICCTPCT 1.3    Urine analysis:    Component Value Date/Time   COLORURINE STRAW (A) 01/21/2018 1828   APPEARANCEUR CLEAR 01/21/2018 1828   LABSPEC 1.006 01/21/2018 1828   PHURINE 5.0 01/21/2018 1828   GLUCOSEU NEGATIVE 01/21/2018 1828   HGBUR MODERATE (A) 01/21/2018 1828   BILIRUBINUR NEGATIVE 01/21/2018 1828   KETONESUR NEGATIVE 01/21/2018 1828   PROTEINUR NEGATIVE 01/21/2018 1828   UROBILINOGEN 0.2 10/07/2015 1958   NITRITE NEGATIVE 01/21/2018 1828   LEUKOCYTESUR NEGATIVE 01/21/2018 1828    Sepsis Labs: Lactic Acid, Venous    Component Value Date/Time   LATICACIDVEN 2.3 (HH) 01/21/2018 1828    MICROBIOLOGY: Recent Results (from the past 240 hour(s))  Culture, blood (single)     Status: None   Collection Time: 01/21/18  7:55 PM  Result Value Ref Range Status   Specimen Description BLOOD HAND LEFT  Final   Special Requests   Final    BOTTLES DRAWN AEROBIC AND ANAEROBIC Blood Culture results may not be optimal due to an excessive volume of blood received in culture bottles   Culture   Final    NO GROWTH 5 DAYS Performed at [facility], [address] [place], [place] [address]  Report Status 01/26/2018 FINAL  Final  C  difficile quick scan w PCR reflex     Status: Abnormal   Collection Time: 01/24/18 11:36 AM  Result Value Ref Range Status   C Diff antigen POSITIVE (A) NEGATIVE Final   C Diff toxin NEGATIVE NEGATIVE Final   C Diff interpretation Results are indeterminate. See PCR results.  Final    Comment: Performed at [facility], [address], [place], [place] [address]  C. Diff by PCR, Reflexed     Status: None   Collection Time: 01/24/18 11:36 AM  Result Value Ref Range Status   Toxigenic C. Difficile by PCR NEGATIVE NEGATIVE Final    Comment: Patient is colonized with non toxigenic C. difficile. May not need treatment unless significant symptoms are present. Performed at [facility], [address] [place], [place] [address]     RADIOLOGY STUDIES/RESULTS: Ct Abdomen Pelvis Wo Contrast  Result Date: 01/20/2018 CLINICAL DATA:  History of pancreatitis EXAM: CT ABDOMEN AND PELVIS WITHOUT CONTRAST TECHNIQUE: Multidetector CT imaging of the abdomen and pelvis was performed following the standard protocol without IV contrast. COMPARISON:  01/16/2018 FINDINGS: Lower chest: Moderate to large bilateral pleural effusions are seen these are similar to a CT of the chest from the previous day but increased when compared with the prior CT examination. Bilateral lower lobe consolidation is noted as well. Hepatobiliary: Liver and gallbladder are within normal limits as visualized. Pancreas: The pancreas again demonstrates significant enlargement with peripancreatic inflammatory change in consistent with known pancreatitis. Lack of IV contrast somewhat limits evaluation of the gland itself. Spleen: Spleen is within normal limits. Adrenals/Urinary Tract: The adrenal glands and kidneys show no focal abnormality. No calculi or obstructive changes are seen. The bladder is decompressed by Foley catheter. Stomach/Bowel: Nasogastric catheter is noted within the stomach. Some thickening of the colonic wall is noted  likely reactive to the free fluid within the abdomen. The appendix appears within normal limits. No obstructive changes are seen. Vascular/Lymphatic: No significant vascular findings are present. No enlarged abdominal or pelvic lymph nodes. Reproductive: Uterus and bilateral adnexa are unremarkable. Other: Free fluid is noted within the pelvis and to a lesser degree in the upper abdomen this has increased somewhat in the interval from prior exam. Musculoskeletal: No acute or significant osseous findings. IMPRESSION: Changes of acute pancreatitis relatively stable when compared with the prior exam. Considerable peripancreatic fluid as well as free fluid in the abdomen is seen. Wall thickening within the colon as described likely of a reactive nature given the free fluid within the abdomen. Bilateral pleural effusions and lower lobe consolidation new when compared with the prior CT of the abdomen. These changes have been previously described on CT of the chest. Electronically Signed   By: Alcide Clever M.D.   On: 01/20/2018 13:53   Ct Abdomen Pelvis Wo Contrast  Result Date: 01/16/2018 CLINICAL DATA:  Abdominal pain radiating to back EXAM: CT ABDOMEN AND PELVIS WITHOUT CONTRAST TECHNIQUE: Multidetector CT imaging of the abdomen and pelvis was performed following the standard protocol without oral or IV contrast. COMPARISON:  None. FINDINGS: Lower chest: Lung bases are clear. Hepatobiliary: There is a patent steatosis. No focal liver lesions are appreciable. Gallbladder wall is not appreciably thickened. There is suspected sludge in gallbladder. There is no biliary duct dilatation. Pancreas: There is mild generalized prominence of the pancreas with what is felt to be a degree edema throughout the pancreas. There is surrounding peripancreatic fluid. Fluid  tracks on the right laterally into the right lateral conal fascia region. There is loculated ascites throughout the anterior abdominal region with mesenteric  thickening throughout the anterolateral abdominal wall regions extending inferior to the level of the pelvis. There is moderate ascites in the dependent portion of the pelvis as well which may partially loculated. Spleen: No splenic lesions are evident. Adrenals/Urinary Tract: Adrenals bilaterally appear unremarkable. Kidneys bilaterally show no evident mass or hydronephrosis on either side. There is no renal or ureteral calculus on either side. Urinary bladder is midline with wall thickness within normal limits. Stomach/Bowel: There is mild wall thickening in multiple loops of small bowel, likely due to enteritis secondary to the pancreatitis. Similar changes are noted in the ascending and descending colon. There is no evident bowel obstruction. No free air or portal venous air. Vascular/Lymphatic: There is no abdominal aortic aneurysm. No vascular lesions are evident. There is no appreciable adenopathy in the abdomen or pelvis. Reproductive: Uterus is anteverted.  No evident pelvic mass. Other: Appendix appears normal. There is no evident abscess in the abdomen or pelvis. Musculoskeletal: No blastic or lytic bone lesions. No intramuscular or abdominal wall lesions are evident. IMPRESSION: 1. Findings indicative of acute pancreatitis. Pancreas appears edematous with peripancreatic fluid. There is fluid tracking throughout the anterolateral abdominal regions with mesenteric thickening in these areas felt to be due to pancreatitis. There is localized partially loculated ascites in the dependent portion of the pelvis. 2. Wall thickening involving multiple loops of small and large bowel, likely due to enterocolitis secondary to inflammation from the peripancreatic fluid/pancreatitis. No bowel obstruction. 3. No frank abscess in the abdomen or pelvis. Appendix appears normal. 4.  No renal or ureteral calculus.  No hydronephrosis. 5. Question sludge in gallbladder. Gallbladder wall does not appear appreciably thickened.  6.  Hepatic steatosis. Electronically Signed   By: Bretta Bang III M.D.   On: 01/16/2018 18:08   Ct Head Wo Contrast  Result Date: 01/18/2018 CLINICAL DATA:  Altered mental status. EXAM: CT HEAD WITHOUT CONTRAST TECHNIQUE: Contiguous axial images were obtained from the base of the skull through the vertex without intravenous contrast. COMPARISON:  CT scan of December 07, 2015. FINDINGS: Brain: No evidence of acute infarction, hemorrhage, hydrocephalus, extra-axial collection or mass lesion/mass effect. Vascular: No hyperdense vessel or unexpected calcification. Skull: Normal. Negative for fracture or focal lesion. Sinuses/Orbits: Probable mild sphenoid sinusitis. Other: None. IMPRESSION: Normal head CT. Electronically Signed   By: Lupita Raider, M.D.   On: 01/18/2018 18:35   Ct Chest Wo Contrast  Result Date: 01/19/2018 CLINICAL DATA:  Follow-up pleural effusion EXAM: CT CHEST WITHOUT CONTRAST TECHNIQUE: Multidetector CT imaging of the chest was performed following the standard protocol without IV contrast. COMPARISON:  Chest x-ray from earlier in the same day FINDINGS: Cardiovascular: Somewhat limited due to the lack of IV contrast. No cardiac enlargement is noted. The thoracic aorta is within normal limits. Left subclavian central line is noted in the mid superior vena cava. Mediastinum/Nodes: Endotracheal tube and nasogastric catheter are noted. The thoracic inlet is within normal limits. No definitive hilar or mediastinal adenopathy is noted although again somewhat limited evaluation due to the lack of IV contrast. Lungs/Pleura: Bilateral lower lobe infiltrate is noted left greater than right with associated moderate effusions left greater than right. Additionally cough left upper lobe infiltrate is noted. These are similar to that seen on recent chest x-ray and again worsened when compared with previous exams. Upper Abdomen: Within normal limits. Musculoskeletal: Within  normal limits. IMPRESSION:  Patchy multifocal infiltrates left greater than right with associated moderate effusions left greater than right. Electronically Signed   By: Alcide Clever M.D.   On: 01/19/2018 12:29   US Abdomen Complete  Result Date: 01/18/2018 CLINICAL DATA:  Pancreatitis.  Abdominal distension EXAM: ABDOMEN ULTRASOUND COMPLETE COMPARISON:  CT abdomen and pelvis Jan 16, 2018 FINDINGS: Gallbladder: No gallstones are appreciable. There is mild sludge in gallbladder. Gallbladder wall does not appear thickened. There is slight pericholecystic fluid, however. No sonographic Murphy sign noted by sonographer. Common bile duct: Diameter: 6 mm. No intrahepatic, common hepatic, or common bile duct dilatation. Liver: No focal lesion identified. Liver echogenicity is increased. Portal vein is patent on color Doppler imaging with normal direction of blood flow towards the liver. IVC: No abnormality visualized. Pancreas: Pancreas is enlarged and edematous. The echotexture of the pancreas is inhomogeneous. There is slight peripancreatic fluid. No evident pancreatic mass or pseudocyst is seen by ultrasound. Spleen: Size and appearance within normal limits. Right Kidney: Length: 11.7 cm. Echogenicity within normal limits. No mass or hydronephrosis visualized. Left Kidney: Length: 11.5 cm. Echogenicity within normal limits. No mass or hydronephrosis visualized. Abdominal aorta: No aneurysm visualized. Other findings: There is a mild degree of ascites. IMPRESSION: 1. Changes of acute pancreatitis. No well-defined pancreatic mass or pseudocyst. 2. Slight pericholecystic fluid. Gallbladder wall is not appear appreciably thickened. There is mild sludge in gallbladder and gallstones. Possibility of a degree of acalculus cholecystitis is of concern. This finding may warrant nuclear medicine hepatobiliary imaging study to assess for cystic duct patency. 3. Increase in liver echogenicity, a finding most likely indicative of hepatic steatosis. While no  focal liver lesions are evident, it must be cautioned that the sensitivity of ultrasound for detection of focal liver lesions is diminished in this circumstance. 4.  Slight ascites. Electronically Signed   By: Bretta Bang III M.D.   On: 01/18/2018 11:03   Dg Chest Port 1 View  Result Date: 01/27/2018 CLINICAL DATA:  Shortness of breath. EXAM: PORTABLE CHEST 1 VIEW COMPARISON:  01/25/2018 FINDINGS: Feeding tube and subclavian central line have been removed. The right chest is clear. There is persistent left lower lobe atelectasis with a left effusion. Aeration may be slightly improved. IMPRESSION: Slight radiographic improvement. Left effusion with left lower lobe atelectasis. Electronically Signed   By: Paulina Fusi M.D.   On: 01/27/2018 09:44   Dg Chest Port 1 View  Result Date: 01/25/2018 CLINICAL DATA:  Status postextubation.  Pleural effusion. EXAM: PORTABLE CHEST 1 VIEW COMPARISON:  Jan 22, 2018 FINDINGS: There is airspace consolidation in the left lower lobe with moderate left pleural effusion, stable. Right lung is clear. Heart is mildly enlarged with pulmonary vascularity normal. Endotracheal tube is been removed. No pneumothorax. Central catheter tip is in the superior vena cava. Feeding tube tip is below the diaphragm. IMPRESSION: No pneumothorax. Tube and catheter positions as described. Persistent left lower lobe consolidation with moderate left pleural effusion. Right lung clear. Stable cardiac silhouette. Electronically Signed   By: Bretta Bang III M.D.   On: 01/25/2018 07:20   Dg Chest Port 1 View  Result Date: 01/22/2018 CLINICAL DATA:  27 year old female with acute pancreatitis. Pleural effusions and pneumonia. Intubated. EXAM: PORTABLE CHEST 1 VIEW COMPARISON:  01/21/2018 and earlier. FINDINGS: Portable AP semi upright view at 0546 hours. Endotracheal tube tip in good position at the level the clavicles. Stable enteric tube, side hole up the level of the mid stomach. Stable  left subclavian central line. Continued confluent in veiling opacity throughout the left lung, sparing the apex. Mediastinal contours remain normal. Decreased veiling opacity in the right lung, which now appears clear allowing for portable technique. No pneumothorax. Mild gaseous distension of the stomach but otherwise paucity of upper abdominal bowel gas. No acute osseous abnormality identified. IMPRESSION: 1. Stable lines and tubes. 2. Continued left pleural effusion with atelectasis and/or pneumonia. 3. Improved right lung ventilation, regressed right pleural effusion. 4. No new cardiopulmonary abnormality. Electronically Signed   By: Odessa Fleming M.D.   On: 01/22/2018 08:51   Dg Chest Port 1 View  Result Date: 01/21/2018 CLINICAL DATA:  Intubation. EXAM: PORTABLE CHEST 1 VIEW COMPARISON:  CT 01/19/2018.  Chest x-ray 01/19/2018. FINDINGS: Endotracheal tube, NG tube, left subclavian line stable position. Heart size stable. Diffuse bilateral pulmonary infiltrates/edema, progressed from prior exam. Persistent left-sided pleural effusion. No pneumothorax. IMPRESSION: 1.  Lines and tubes in stable position. 2. Progressive bilateral pulmonary infiltrates/edema. Persistent left pleural effusion. Electronically Signed   By: Maisie Fus  Register   On: 01/21/2018 08:38   Dg Chest Port 1 View  Result Date: 01/19/2018 CLINICAL DATA:  Check endotracheal tube placement EXAM: PORTABLE CHEST 1 VIEW COMPARISON:  01/18/2018 FINDINGS: Cardiac shadow is stable. Left-sided pleural effusion is again noted and slightly increased when compared with the prior exam. Increasing consolidation in the left base is noted as well. Endotracheal tube and nasogastric catheter are again seen in satisfactory position. Left subclavian central line is noted as well. The right lung remains clear. IMPRESSION: Increase in left basilar consolidation and left pleural effusion. Electronically Signed   By: Alcide Clever M.D.   On: 01/19/2018 08:51   Dg Chest  Port 1 View  Result Date: 01/18/2018 CLINICAL DATA:  Followup for respiratory failure. EXAM: PORTABLE CHEST 1 VIEW COMPARISON:  01/17/2018 and older exams. FINDINGS: Left pleural effusion which has increased in size from the previous day's study. There is persistent left lung base opacity consistent with atelectasis or possibly pneumonia. Right lung remains clear. No right pleural effusion.  No pneumothorax. Endotracheal tube, left subclavian central venous line and nasal/orogastric tube are stable and well positioned. IMPRESSION: 1. Mild interval worsening of lung aeration on the left with an increase in left pleural fluid. There is persistent left lung base opacity consistent with pneumonia or atelectasis. 2. No other change. Right lung remains clear. Support apparatus is stable and well positioned. Electronically Signed   By: Amie Portland M.D.   On: 01/18/2018 08:55   Dg Chest Portable 1 View  Result Date: 01/17/2018 CLINICAL DATA:  Post intubation EXAM: PORTABLE CHEST 1 VIEW COMPARISON:  05/02/2015 FINDINGS: An endotracheal tube has been placed with tip measuring 1.8 cm above the carina. Enteric tube tip is off the field of view but below the left hemidiaphragm. Shallow inspiration. Heart size and pulmonary vascularity are normal. Lungs are clear and expanded. Mediastinal contours appear intact. Thoracic scoliosis convex towards the right. IMPRESSION: Appliances appear in satisfactory position. No active pulmonary disease. Electronically Signed   By: Burman Nieves M.D.   On: 01/17/2018 06:25   Dg Chest Port 1v Same Day  Result Date: 01/17/2018 CLINICAL DATA:  Displacement of central venous catheter. EXAM: PORTABLE CHEST 1 VIEW COMPARISON:  01/17/2018 at 6:08 a.m. FINDINGS: Since prior exam, a left subclavian central venous line has been placed. The tip projects at the caval atrial junction. No pneumothorax. The endotracheal tube and the nasal/orogastric tube are stable and well positioned. There is  new left lung base opacity mostly silhouetting the left hemidiaphragm. This may reflect atelectasis or a rapidly evolving infection. Atelectasis is favored. IMPRESSION: 1. New left subclavian central venous line is well positioned, tip at the caval atrial junction. No pneumothorax. 2. New left lung base opacity, most likely atelectasis. Consider pneumonia if there are consistent clinical findings. Electronically Signed   By: Amie Portland M.D.   On: 01/17/2018 12:27   Dg Abd Portable 1v  Result Date: 01/23/2018 CLINICAL DATA:  Evaluate NG tube placement EXAM: PORTABLE ABDOMEN - 1 VIEW COMPARISON:  CT scan Jan 20, 2018 FINDINGS: The feeding tube terminates in the left abdomen, likely in the proximal jejunum. Air-filled mildly prominent loops of small bowel in the abdomen measure up to 3.4 cm. IMPRESSION: 1. The feeding tube is in good position. 2. Air-filled mildly prominent/dilated loops of small bowel. This could represent developing ileus or small-bowel obstruction. Electronically Signed   By: Gerome Sam III M.D   On: 01/23/2018 11:27     LOS: 12 days   Jeoffrey Massed, MD  [facility]  If 7PM-7AM, please contact night-coverage  Please page via [email or web address]-click on MD name and type text message  01/28/2018, 11:28 AM

## 2018-01-29 MED ORDER — LEVETIRACETAM 500 MG PO TABS: 500.0000 mg | ORAL_TABLET | Freq: Two times a day (BID) | ORAL | 0 refills | Status: DC

## 2018-01-29 MED ORDER — ENSURE ENLIVE PO LIQD: 237.0000 mL | Freq: Three times a day (TID) | ORAL | 0 refills | Status: DC

## 2018-01-29 MED ORDER — LABETALOL HCL 200 MG PO TABS: 200.0000 mg | ORAL_TABLET | Freq: Two times a day (BID) | ORAL | 0 refills | Status: DC

## 2018-01-29 MED ORDER — THIAMINE HCL 100 MG PO TABS: 100.0000 mg | ORAL_TABLET | Freq: Every day | ORAL | 0 refills | Status: DC

## 2018-01-29 NOTE — Care Management Note (Signed)
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 with need for intubation and vent support-  Extubated 5/31                 Action/Plan: PTA Pt lived at home alone,  Per PT/OT evals recommendation for CIR, consult has been placed- CIR following for possible admission, CSW and CM will follow - CM did receive call from patients insurance provided on 6/4 regarding transition of care needs- will need to use [facility] for any HH/DME needs for approval- phone # [phone] fax [phone], CM at [name] is [employer]- contact # [phone] ext [phone]   Expected Discharge Date:  01/29/18               Expected Discharge Plan:  Home w Home Health Services  In-House Referral:  Clinical Social Work  Discharge planning Services  CM Consult  Post Acute Care Choice:    Choice offered to:     DME Arranged:    DME Agency:     HH Arranged:  PT HH Agency:  Other - See comment  Status of Service:  In process, will continue to follow  If discussed at Long Length of Stay Meetings, dates discussed:    Discharge Disposition:   Additional Comments:  01/28/18 [name], RN, [name] Referral made to [facility] for HHPT, as ordered by MD.  Phoned in referral and faxed orders and clinical information to [phone], per request.  Intake # [id number].  HHPT will be set up by payor, and pt will be contacted with provider information when complete.  Plan start of care for 01/30/18.  Pt has no PCP; will have to call [phone] number on back of insurance card for PCP information, or call [facility] physician referral service for assistance to set up PCP.  I will provide [facility] information on AVS inEPIC.    6/7 Briyanna Billingham RN CM Spoke w patient who declines DME RW and 3/1. [facility] to patient and DCing [facility] as it it referred to 3rd party per insurance. Both verbalized understanding. Patient states she has resources for PCP. LM w [employer] CM mercedes at [phone] ext [phone] explaining patient will DC w referral made to [facility] for hh PT. No other CM needs.   Quintella BatonJulie W. Amerson, RN, [name]  Trauma/Neuro ICU Case Manager [phone]

## 2018-01-29 NOTE — Progress Notes (Signed)
Patient discharge teaching given, including activity, diet, follow-up appoints, and medications. Patient verbalized understanding of all discharge instructions. IV access was d/c'd. Vitals are stable. Skin is intact except as charted in most recent assessments. Pt to be escorted out by NT, to be driven home by sister, awaiting her arrival.

## 2018-01-29 NOTE — Discharge Summary (Signed)
PATIENT DETAILS Name: Donna Short Age: 27 y.o. Sex: female Date of Birth: 1991-07-28 MRN: [id number]. Admitting Physician: Carin Hock, MD WUJ:[id number], No Pcp Per  Admit Date: 01/16/2018 Discharge date: 01/29/2018  Recommendations for Outpatient Follow-up:  1. Follow up with PCP in 1-2 weeks 2. Please obtain BMP/CBC in one week 3. Please ensure follow-up with neurology  Admitted From:  Home  Disposition: Home with home health services   Home Health: Yes  Equipment/Devices: None  Discharge Condition: Stable  CODE STATUS: FULL CODE  Diet recommendation:  Heart Healthy  Brief Summary: See H&P, Labs, Consult and Test reports for all details in brief,Patient is a 27 y.o. female with long-standing history of alcohol abuse, seizure disorder, asthma presented with pancreatitis, hospital course complicated by development of acute encephalopathy, possible alcohol withdrawal seizure, bilateral pulmonary infiltrates-highly suspicious for probable aspiration pneumonia-she was intubated and managed in the intensive care unit.  Upon stability she was transferred to the trial hospitalist service.  See below for further details  Significant Events: 5/25 admit through ED  5/26 clinical decline - PCCM consulted >intubated + pressors  5/31 extubated   Brief Hospital Course: Acute pancreatitis: Much improved-hardly any abdominal tenderness this morning-diet has been advanced to a soft diet which she has easily tolerated. Etiology of pancreatitis likely secondary to alcohol use.  Have counseled extensively-he claims she has every intention of quitting EtOH use.   Acute metabolic encephalopathy: Resolved, she is completely awake and alert.  CT head negative for acute process.  Acute hypoxic respiratory failure with bilateral pulmonary infiltrates-suspected aspiration pneumonia: Improved-no longer on oxygen.  Has completed a course of antimicrobial therapy.  All cultures  continue to be negative.  She is afebrile, leukocytosis has almost resolved.    Alcohol withdrawal: Out of the window for alcohol withdrawal-managed with Precedex infusion and CIWA protocol while in the ICU.  Counseled extensively-claims she has every intention of quitting at this time.  Elevated troponin: Likely demand ischemia-TTE showed EF around 55%-doubt further work-up is required.  Troponin trend was flat and not consistent with ACS.  Seizure disorder: Although suspected to have a alcohol withdrawal seizure-she does apparently have a history of seizure disorder-she used to follow with neurology in the past.  She unfortunately noncompliant-stop taking antiseizure medications and stopped following up with neurology.  Plans are to continue with Keppra on discharge and follow-up with neurology as outpatient (have sent a epic referral to neurology).    Hypertension/tachycardia: Controlled with labetalol   Anemia: Likely secondary to acute illness-no evidence of blood loss-hemoglobin stable at this point.  Follow periodically  Thrombocytopenia: Resolved  Hypokalemia: Replete and recheck in 1 week  Dysphagia: Suspect this was related to confusion/ICU illness when she was critically ill-appreciate speech therapy input-she has markedly improved-easily tolerated a soft diet.  Protein calorie malnutrition following critical illness: Continue supplements  Procedures/Studies: See above  Discharge Diagnoses:  Principal Problem:   Pancreatitis, acute Active Problems:   Hyponatremia   ARF (acute renal failure) (HCC)   Pancreatitis, alcoholic, acute   Hypotension due to hypovolemia   Encephalopathy acute   AKI (acute kidney injury) (HCC)   Physical deconditioning   Respiratory failure (HCC)   Uncomplicated asthma   Benign essential HTN   Tachycardia   ETOH abuse   Tobacco abuse   Tachypnea   Leukocytosis   Acute blood loss anemia   Discharge Instructions: Activity:  As  tolerated with Full fall precautions use walker/cane & assistance as needed  Discharge  Instructions    Ambulatory referral to Neurology   Complete by:  As directed    An appointment is requested in approximately: 2 weeks   Diet - low sodium heart healthy   Complete by:  As directed    Discharge instructions   Complete by:  As directed     Per [facility] statutes, patients with seizures are not allowed to drive until they have been seizure-free for six months. Use caution when using heavy equipment or power tools. Avoid working on ladders or at heights. Take showers instead of baths. Ensure the water temperature is not too high on the home water heater. Do not go swimming alone. When caring for infants or small children, sit down when holding, feeding, or changing them to minimize risk of injury to the child in the event you have a seizure.   Follow with Primary MD of your choice in 1 week and Dr Terrace Arabia in 2-3 weeks.   Please get a complete blood count and chemistry panel checked by your Primary MD at your next visit, and again as instructed by your Primary MD.  Get Medicines reviewed and adjusted: Please take all your medications with you for your next visit with your Primary MD  Laboratory/radiological data: Please request your Primary MD to go over all hospital tests and procedure/radiological results at the follow up, please ask your Primary MD to get all Hospital records sent to his/her office.  In some cases, they will be blood work, cultures and biopsy results pending at the time of your discharge. Please request that your primary care M.D. follows up on these results.  Also Note the following: If you experience worsening of your admission symptoms, develop shortness of breath, life threatening emergency, suicidal or homicidal thoughts you must seek medical attention immediately by calling 911 or calling your MD immediately  if symptoms less severe.  You must read complete  instructions/literature along with all the possible adverse reactions/side effects for all the Medicines you take and that have been prescribed to you. Take any new Medicines after you have completely understood and accpet all the possible adverse reactions/side effects.   Do not drive when taking Pain medications or sleeping medications (Benzodaizepines)  Do not take more than prescribed Pain, Sleep and Anxiety Medications. It is not advisable to combine anxiety,sleep and pain medications without talking with your primary care practitioner  Special Instructions: If you have smoked or chewed Tobacco  in the last 2 yrs please stop smoking, stop any regular Alcohol  and or any Recreational drug use.  Wear Seat belts while driving.  Please note: You were cared for by a hospitalist during your hospital stay. Once you are discharged, your primary care physician will handle any further medical issues. Please note that NO REFILLS for any discharge medications will be authorized once you are discharged, as it is imperative that you return to your primary care physician (or establish a relationship with a primary care physician if you do not have one) for your post hospital discharge needs so that they can reassess your need for medications and monitor your lab values.   Increase activity slowly   Complete by:  As directed      Allergies as of 01/29/2018      Reactions   Benadryl [diphenhydramine Hcl (sleep)] Rash   Latex Rash   Penicillins Itching, Rash   Has patient had a PCN reaction causing immediate rash, facial/tongue/throat swelling, SOB or lightheadedness with hypotension: Yes  Has patient had a PCN reaction causing severe rash involving mucus membranes or skin necrosis: No Has patient had a PCN reaction that required hospitalization: Yes Has patient had a PCN reaction occurring within the last 10 years: No If all of the above answers are "NO", then may proceed with Cephalosporin use.;   Shrimp  [shellfish Allergy] Hives, Rash      Medication List    STOP taking these medications   aspirin-acetaminophen-caffeine 250-250-65 MG tablet Commonly known as:  EXCEDRIN MIGRAINE   lamoTRIgine 100 MG tablet Commonly known as:  LAMICTAL   ondansetron 4 MG disintegrating tablet Commonly known as:  ZOFRAN-ODT     TAKE these medications   feeding supplement (ENSURE ENLIVE) Liqd Take 237 mLs by mouth 3 (three) times daily between meals.   labetalol 200 MG tablet Commonly known as:  NORMODYNE Take 1 tablet (200 mg total) by mouth 2 (two) times daily.   levETIRAcetam 500 MG tablet Commonly known as:  KEPPRA Take 1 tablet (500 mg total) by mouth 2 (two) times daily.   thiamine 100 MG tablet Take 1 tablet (100 mg total) by mouth daily. Start taking on:  01/30/2018            Durable Medical Equipment  (From admission, onward)        Start     Ordered   01/27/18 1529  For home use only DME Walker rolling  Once    Question:  Patient needs a walker to treat with the following condition  Answer:  Weakness   01/27/18 1528   01/27/18 1528  For home use only DME 3 n 1  Once     01/27/18 1528     Follow-up Information    [facility] Follow up.   Why:  Call ASAP for assistance with finding primary care MD Contact information: [facility]  [phone]       Levert Feinstein, MD Follow up.   Specialty:  Neurology Why:  Office will call you for a follow-up appointment, if you do not hear from them please give them a call. Contact information: [address] [place] [address] [phone]          Allergies  Allergen Reactions  . Benadryl [Diphenhydramine Hcl (Sleep)] Rash  . Latex Rash  . Penicillins Itching and Rash    Has patient had a PCN reaction causing immediate rash, facial/tongue/throat swelling, SOB or lightheadedness with hypotension: Yes Has patient had a PCN reaction causing severe rash involving mucus membranes or skin  necrosis: No Has patient had a PCN reaction that required hospitalization: Yes Has patient had a PCN reaction occurring within the last 10 years: No If all of the above answers are "NO", then may proceed with Cephalosporin use.;  . Shrimp [Shellfish Allergy] Hives and Rash    Consultations:   pulmonary/intensive care, nephrology and general surgery  Other Procedures/Studies: Ct Abdomen Pelvis Wo Contrast  Result Date: 01/20/2018 CLINICAL DATA:  History of pancreatitis EXAM: CT ABDOMEN AND PELVIS WITHOUT CONTRAST TECHNIQUE: Multidetector CT imaging of the abdomen and pelvis was performed following the standard protocol without IV contrast. COMPARISON:  01/16/2018 FINDINGS: Lower chest: Moderate to large bilateral pleural effusions are seen these are similar to a CT of the chest from the previous day but increased when compared with the prior CT examination. Bilateral lower lobe consolidation is noted as well. Hepatobiliary: Liver and gallbladder are within normal limits as visualized. Pancreas: The pancreas again demonstrates significant enlargement  with peripancreatic inflammatory change in consistent with known pancreatitis. Lack of IV contrast somewhat limits evaluation of the gland itself. Spleen: Spleen is within normal limits. Adrenals/Urinary Tract: The adrenal glands and kidneys show no focal abnormality. No calculi or obstructive changes are seen. The bladder is decompressed by Foley catheter. Stomach/Bowel: Nasogastric catheter is noted within the stomach. Some thickening of the colonic wall is noted likely reactive to the free fluid within the abdomen. The appendix appears within normal limits. No obstructive changes are seen. Vascular/Lymphatic: No significant vascular findings are present. No enlarged abdominal or pelvic lymph nodes. Reproductive: Uterus and bilateral adnexa are unremarkable. Other: Free fluid is noted within the pelvis and to a lesser degree in the upper abdomen this has  increased somewhat in the interval from prior exam. Musculoskeletal: No acute or significant osseous findings. IMPRESSION: Changes of acute pancreatitis relatively stable when compared with the prior exam. Considerable peripancreatic fluid as well as free fluid in the abdomen is seen. Wall thickening within the colon as described likely of a reactive nature given the free fluid within the abdomen. Bilateral pleural effusions and lower lobe consolidation new when compared with the prior CT of the abdomen. These changes have been previously described on CT of the chest. Electronically Signed   By: Alcide Clever M.D.   On: 01/20/2018 13:53   Ct Abdomen Pelvis Wo Contrast  Result Date: 01/16/2018 CLINICAL DATA:  Abdominal pain radiating to back EXAM: CT ABDOMEN AND PELVIS WITHOUT CONTRAST TECHNIQUE: Multidetector CT imaging of the abdomen and pelvis was performed following the standard protocol without oral or IV contrast. COMPARISON:  None. FINDINGS: Lower chest: Lung bases are clear. Hepatobiliary: There is a patent steatosis. No focal liver lesions are appreciable. Gallbladder wall is not appreciably thickened. There is suspected sludge in gallbladder. There is no biliary duct dilatation. Pancreas: There is mild generalized prominence of the pancreas with what is felt to be a degree edema throughout the pancreas. There is surrounding peripancreatic fluid. Fluid tracks on the right laterally into the right lateral conal fascia region. There is loculated ascites throughout the anterior abdominal region with mesenteric thickening throughout the anterolateral abdominal wall regions extending inferior to the level of the pelvis. There is moderate ascites in the dependent portion of the pelvis as well which may partially loculated. Spleen: No splenic lesions are evident. Adrenals/Urinary Tract: Adrenals bilaterally appear unremarkable. Kidneys bilaterally show no evident mass or hydronephrosis on either side. There is  no renal or ureteral calculus on either side. Urinary bladder is midline with wall thickness within normal limits. Stomach/Bowel: There is mild wall thickening in multiple loops of small bowel, likely due to enteritis secondary to the pancreatitis. Similar changes are noted in the ascending and descending colon. There is no evident bowel obstruction. No free air or portal venous air. Vascular/Lymphatic: There is no abdominal aortic aneurysm. No vascular lesions are evident. There is no appreciable adenopathy in the abdomen or pelvis. Reproductive: Uterus is anteverted.  No evident pelvic mass. Other: Appendix appears normal. There is no evident abscess in the abdomen or pelvis. Musculoskeletal: No blastic or lytic bone lesions. No intramuscular or abdominal wall lesions are evident. IMPRESSION: 1. Findings indicative of acute pancreatitis. Pancreas appears edematous with peripancreatic fluid. There is fluid tracking throughout the anterolateral abdominal regions with mesenteric thickening in these areas felt to be due to pancreatitis. There is localized partially loculated ascites in the dependent portion of the pelvis. 2. Wall thickening involving multiple loops of small  and large bowel, likely due to enterocolitis secondary to inflammation from the peripancreatic fluid/pancreatitis. No bowel obstruction. 3. No frank abscess in the abdomen or pelvis. Appendix appears normal. 4.  No renal or ureteral calculus.  No hydronephrosis. 5. Question sludge in gallbladder. Gallbladder wall does not appear appreciably thickened. 6.  Hepatic steatosis. Electronically Signed   By: Bretta BangWilliam  Woodruff III M.D.   On: 01/16/2018 18:08   Ct Head Wo Contrast  Result Date: 01/18/2018 CLINICAL DATA:  Altered mental status. EXAM: CT HEAD WITHOUT CONTRAST TECHNIQUE: Contiguous axial images were obtained from the base of the skull through the vertex without intravenous contrast. COMPARISON:  CT scan of December 07, 2015. FINDINGS: Brain:  No evidence of acute infarction, hemorrhage, hydrocephalus, extra-axial collection or mass lesion/mass effect. Vascular: No hyperdense vessel or unexpected calcification. Skull: Normal. Negative for fracture or focal lesion. Sinuses/Orbits: Probable mild sphenoid sinusitis. Other: None. IMPRESSION: Normal head CT. Electronically Signed   By: Lupita RaiderJames  Green Jr, M.D.   On: 01/18/2018 18:35   Ct Chest Wo Contrast  Result Date: 01/19/2018 CLINICAL DATA:  Follow-up pleural effusion EXAM: CT CHEST WITHOUT CONTRAST TECHNIQUE: Multidetector CT imaging of the chest was performed following the standard protocol without IV contrast. COMPARISON:  Chest x-ray from earlier in the same day FINDINGS: Cardiovascular: Somewhat limited due to the lack of IV contrast. No cardiac enlargement is noted. The thoracic aorta is within normal limits. Left subclavian central line is noted in the mid superior vena cava. Mediastinum/Nodes: Endotracheal tube and nasogastric catheter are noted. The thoracic inlet is within normal limits. No definitive hilar or mediastinal adenopathy is noted although again somewhat limited evaluation due to the lack of IV contrast. Lungs/Pleura: Bilateral lower lobe infiltrate is noted left greater than right with associated moderate effusions left greater than right. Additionally cough left upper lobe infiltrate is noted. These are similar to that seen on recent chest x-ray and again worsened when compared with previous exams. Upper Abdomen: Within normal limits. Musculoskeletal: Within normal limits. IMPRESSION: Patchy multifocal infiltrates left greater than right with associated moderate effusions left greater than right. Electronically Signed   By: Alcide CleverMark  Lukens M.D.   On: 01/19/2018 12:29   [place] Abdomen Complete  Result Date: 01/18/2018 CLINICAL DATA:  Pancreatitis.  Abdominal distension EXAM: ABDOMEN ULTRASOUND COMPLETE COMPARISON:  CT abdomen and pelvis Jan 16, 2018 FINDINGS: Gallbladder: No gallstones  are appreciable. There is mild sludge in gallbladder. Gallbladder wall does not appear thickened. There is slight pericholecystic fluid, however. No sonographic Murphy sign noted by sonographer. Common bile duct: Diameter: 6 mm. No intrahepatic, common hepatic, or common bile duct dilatation. Liver: No focal lesion identified. Liver echogenicity is increased. Portal vein is patent on color Doppler imaging with normal direction of blood flow towards the liver. IVC: No abnormality visualized. Pancreas: Pancreas is enlarged and edematous. The echotexture of the pancreas is inhomogeneous. There is slight peripancreatic fluid. No evident pancreatic mass or pseudocyst is seen by ultrasound. Spleen: Size and appearance within normal limits. Right Kidney: Length: 11.7 cm. Echogenicity within normal limits. No mass or hydronephrosis visualized. Left Kidney: Length: 11.5 cm. Echogenicity within normal limits. No mass or hydronephrosis visualized. Abdominal aorta: No aneurysm visualized. Other findings: There is a mild degree of ascites. IMPRESSION: 1. Changes of acute pancreatitis. No well-defined pancreatic mass or pseudocyst. 2. Slight pericholecystic fluid. Gallbladder wall is not appear appreciably thickened. There is mild sludge in gallbladder and gallstones. Possibility of a degree of acalculus cholecystitis is of concern. This  finding may warrant nuclear medicine hepatobiliary imaging study to assess for cystic duct patency. 3. Increase in liver echogenicity, a finding most likely indicative of hepatic steatosis. While no focal liver lesions are evident, it must be cautioned that the sensitivity of ultrasound for detection of focal liver lesions is diminished in this circumstance. 4.  Slight ascites. Electronically Signed   By: Bretta Bang III M.D.   On: 01/18/2018 11:03   Dg Chest Port 1 View  Result Date: 01/27/2018 CLINICAL DATA:  Shortness of breath. EXAM: PORTABLE CHEST 1 VIEW COMPARISON:  01/25/2018  FINDINGS: Feeding tube and subclavian central line have been removed. The right chest is clear. There is persistent left lower lobe atelectasis with a left effusion. Aeration may be slightly improved. IMPRESSION: Slight radiographic improvement. Left effusion with left lower lobe atelectasis. Electronically Signed   By: Paulina Fusi M.D.   On: 01/27/2018 09:44   Dg Chest Port 1 View  Result Date: 01/25/2018 CLINICAL DATA:  Status postextubation.  Pleural effusion. EXAM: PORTABLE CHEST 1 VIEW COMPARISON:  Jan 22, 2018 FINDINGS: There is airspace consolidation in the left lower lobe with moderate left pleural effusion, stable. Right lung is clear. Heart is mildly enlarged with pulmonary vascularity normal. Endotracheal tube is been removed. No pneumothorax. Central catheter tip is in the superior vena cava. Feeding tube tip is below the diaphragm. IMPRESSION: No pneumothorax. Tube and catheter positions as described. Persistent left lower lobe consolidation with moderate left pleural effusion. Right lung clear. Stable cardiac silhouette. Electronically Signed   By: Bretta Bang III M.D.   On: 01/25/2018 07:20   Dg Chest Port 1 View  Result Date: 01/22/2018 CLINICAL DATA:  27 year old female with acute pancreatitis. Pleural effusions and pneumonia. Intubated. EXAM: PORTABLE CHEST 1 VIEW COMPARISON:  01/21/2018 and earlier. FINDINGS: Portable AP semi upright view at 0546 hours. Endotracheal tube tip in good position at the level the clavicles. Stable enteric tube, side hole up the level of the mid stomach. Stable left subclavian central line. Continued confluent in veiling opacity throughout the left lung, sparing the apex. Mediastinal contours remain normal. Decreased veiling opacity in the right lung, which now appears clear allowing for portable technique. No pneumothorax. Mild gaseous distension of the stomach but otherwise paucity of upper abdominal bowel gas. No acute osseous abnormality identified.  IMPRESSION: 1. Stable lines and tubes. 2. Continued left pleural effusion with atelectasis and/or pneumonia. 3. Improved right lung ventilation, regressed right pleural effusion. 4. No new cardiopulmonary abnormality. Electronically Signed   By: Odessa Fleming M.D.   On: 01/22/2018 08:51   Dg Chest Port 1 View  Result Date: 01/21/2018 CLINICAL DATA:  Intubation. EXAM: PORTABLE CHEST 1 VIEW COMPARISON:  CT 01/19/2018.  Chest x-ray 01/19/2018. FINDINGS: Endotracheal tube, NG tube, left subclavian line stable position. Heart size stable. Diffuse bilateral pulmonary infiltrates/edema, progressed from prior exam. Persistent left-sided pleural effusion. No pneumothorax. IMPRESSION: 1.  Lines and tubes in stable position. 2. Progressive bilateral pulmonary infiltrates/edema. Persistent left pleural effusion. Electronically Signed   By: Maisie Fus  Register   On: 01/21/2018 08:38   Dg Chest Port 1 View  Result Date: 01/19/2018 CLINICAL DATA:  Check endotracheal tube placement EXAM: PORTABLE CHEST 1 VIEW COMPARISON:  01/18/2018 FINDINGS: Cardiac shadow is stable. Left-sided pleural effusion is again noted and slightly increased when compared with the prior exam. Increasing consolidation in the left base is noted as well. Endotracheal tube and nasogastric catheter are again seen in satisfactory position. Left subclavian central line is  noted as well. The right lung remains clear. IMPRESSION: Increase in left basilar consolidation and left pleural effusion. Electronically Signed   By: Alcide Clever M.D.   On: 01/19/2018 08:51   Dg Chest Port 1 View  Result Date: 01/18/2018 CLINICAL DATA:  Followup for respiratory failure. EXAM: PORTABLE CHEST 1 VIEW COMPARISON:  01/17/2018 and older exams. FINDINGS: Left pleural effusion which has increased in size from the previous day's study. There is persistent left lung base opacity consistent with atelectasis or possibly pneumonia. Right lung remains clear. No right pleural effusion.  No  pneumothorax. Endotracheal tube, left subclavian central venous line and nasal/orogastric tube are stable and well positioned. IMPRESSION: 1. Mild interval worsening of lung aeration on the left with an increase in left pleural fluid. There is persistent left lung base opacity consistent with pneumonia or atelectasis. 2. No other change. Right lung remains clear. Support apparatus is stable and well positioned. Electronically Signed   By: Amie Portland M.D.   On: 01/18/2018 08:55   Dg Chest Portable 1 View  Result Date: 01/17/2018 CLINICAL DATA:  Post intubation EXAM: PORTABLE CHEST 1 VIEW COMPARISON:  05/02/2015 FINDINGS: An endotracheal tube has been placed with tip measuring 1.8 cm above the carina. Enteric tube tip is off the field of view but below the left hemidiaphragm. Shallow inspiration. Heart size and pulmonary vascularity are normal. Lungs are clear and expanded. Mediastinal contours appear intact. Thoracic scoliosis convex towards the right. IMPRESSION: Appliances appear in satisfactory position. No active pulmonary disease. Electronically Signed   By: Burman Nieves M.D.   On: 01/17/2018 06:25   Dg Chest Port 1v Same Day  Result Date: 01/17/2018 CLINICAL DATA:  Displacement of central venous catheter. EXAM: PORTABLE CHEST 1 VIEW COMPARISON:  01/17/2018 at 6:08 a.m. FINDINGS: Since prior exam, a left subclavian central venous line has been placed. The tip projects at the caval atrial junction. No pneumothorax. The endotracheal tube and the nasal/orogastric tube are stable and well positioned. There is new left lung base opacity mostly silhouetting the left hemidiaphragm. This may reflect atelectasis or a rapidly evolving infection. Atelectasis is favored. IMPRESSION: 1. New left subclavian central venous line is well positioned, tip at the caval atrial junction. No pneumothorax. 2. New left lung base opacity, most likely atelectasis. Consider pneumonia if there are consistent clinical  findings. Electronically Signed   By: Amie Portland M.D.   On: 01/17/2018 12:27   Dg Abd Portable 1v  Result Date: 01/23/2018 CLINICAL DATA:  Evaluate NG tube placement EXAM: PORTABLE ABDOMEN - 1 VIEW COMPARISON:  CT scan Jan 20, 2018 FINDINGS: The feeding tube terminates in the left abdomen, likely in the proximal jejunum. Air-filled mildly prominent loops of small bowel in the abdomen measure up to 3.4 cm. IMPRESSION: 1. The feeding tube is in good position. 2. Air-filled mildly prominent/dilated loops of small bowel. This could represent developing ileus or small-bowel obstruction. Electronically Signed   By: Gerome Sam III M.D   On: 01/23/2018 11:27   Dg Swallowing Func-speech Pathology  Result Date: 01/29/2018 Objective Swallowing Evaluation: Type of Study: MBS-Modified Barium Swallow Study  Patient Details Name: Donna Short MRN: [id number] Date of Birth: May 13, 1991 Today's Date: 01/29/2018 Time: SLP Start Time (ACUTE ONLY): 1020 -SLP Stop Time (ACUTE ONLY): 1037 SLP Time Calculation (min) (ACUTE ONLY): 17 min Past Medical History: Past Medical History: Diagnosis Date . Asthma  . Hypertension  . Pancreatitis 01/2018 . Seizures (HCC)  Past Surgical History: Past Surgical History: Procedure  Laterality Date . WISDOM TOOTH EXTRACTION   HPI: 27 yr old female with PMHx HTN, Asthma, Seizures and known ETOH abuse presented 5/25 with abdominal pain, nausea, vomiting. CTA/P shows pancreatitis. HTN and Tachycardic last ETOH was on 5/21, now s/p seizure, intubated 5/26-5/31/19.  Subjective: pt nervous about trying POs Assessment / Plan / Recommendation CHL IP CLINICAL IMPRESSIONS 01/28/2018 Clinical Impression Pt has a mild, likely cognitively-based oral dysphagia characterized by lingual pumping and slow transit of purees. She declined any other solid textures. Her pharyngeal phase of swallow was [facility]. One wet-sounding cough noted during testing was not related to airway compromise. The barium tablet appeared to  have slow clearance through the esophagus, particularly at the entrance of the stomach, although it did clear (MD not present to confirm). Pt does however describe regurgitation of water earlier this morning. Upon questioning, it appears as though she drank a large volume of water and then tried to lie down. Recommend starting with a full liquid diet for cognitive and possible esophageal issues. SLP will f/u for tolerance with good prognosis to advance.  SLP Visit Diagnosis Dysphagia, unspecified (R13.10) Attention and concentration deficit following -- Frontal lobe and executive function deficit following -- Impact on safety and function --   CHL IP TREATMENT RECOMMENDATION 01/25/2018 Treatment Recommendations Therapy as outlined in treatment plan below   Prognosis 01/25/2018 Prognosis for Safe Diet Advancement Good Barriers to Reach Goals Cognitive deficits Barriers/Prognosis Comment -- CHL IP DIET RECOMMENDATION 01/28/2018 SLP Diet Recommendations -- Liquid Administration via -- Medication Administration -- Compensations Minimize environmental distractions;Slow rate;Small sips/bites;Follow solids with liquid Postural Changes --   CHL IP OTHER RECOMMENDATIONS 01/25/2018 Recommended Consults Consider esophageal assessment Oral Care Recommendations Oral care BID Other Recommendations --   CHL IP FOLLOW UP RECOMMENDATIONS 01/28/2018 Follow up Recommendations Other (comment)   CHL IP FREQUENCY AND DURATION 01/25/2018 Speech Therapy Frequency (ACUTE ONLY) min 2x/week Treatment Duration 2 weeks      CHL IP ORAL PHASE 01/25/2018 Oral Phase Impaired Oral - Pudding Teaspoon -- Oral - Pudding Cup -- Oral - Honey Teaspoon -- Oral - Honey Cup -- Oral - Nectar Teaspoon -- Oral - Nectar Cup -- Oral - Nectar Straw -- Oral - Thin Teaspoon -- Oral - Thin Cup WFL Oral - Thin Straw WFL Oral - Puree Delayed oral transit;Reduced posterior propulsion;Lingual pumping Oral - Mech Soft -- Oral - Regular -- Oral - Multi-Consistency -- Oral - Pill WFL  Oral Phase - Comment --  CHL IP PHARYNGEAL PHASE 01/25/2018 Pharyngeal Phase WFL Pharyngeal- Pudding Teaspoon -- Pharyngeal -- Pharyngeal- Pudding Cup -- Pharyngeal -- Pharyngeal- Honey Teaspoon -- Pharyngeal -- Pharyngeal- Honey Cup -- Pharyngeal -- Pharyngeal- Nectar Teaspoon -- Pharyngeal -- Pharyngeal- Nectar Cup -- Pharyngeal -- Pharyngeal- Nectar Straw -- Pharyngeal -- Pharyngeal- Thin Teaspoon -- Pharyngeal -- Pharyngeal- Thin Cup -- Pharyngeal -- Pharyngeal- Thin Straw -- Pharyngeal -- Pharyngeal- Puree -- Pharyngeal -- Pharyngeal- Mechanical Soft -- Pharyngeal -- Pharyngeal- Regular -- Pharyngeal -- Pharyngeal- Multi-consistency -- Pharyngeal -- Pharyngeal- Pill -- Pharyngeal -- Pharyngeal Comment --  CHL IP CERVICAL ESOPHAGEAL PHASE 01/25/2018 Cervical Esophageal Phase WFL Pudding Teaspoon -- Pudding Cup -- Honey Teaspoon -- Honey Cup -- Nectar Teaspoon -- Nectar Cup -- Nectar Straw -- Thin Teaspoon -- Thin Cup -- Thin Straw -- Puree -- Mechanical Soft -- Regular -- Multi-consistency -- Pill -- Cervical Esophageal Comment -- No flowsheet data found. Maxcine Ham 01/29/2018, 8:46 AM  Maxcine Ham, M.A. CCC-SLP [phone]  TODAY-DAY OF DISCHARGE:  Subjective:   Donna Short today has no headache,no chest abdominal pain,no new weakness tingling or numbness, feels much better wants to go home today.   Objective:   Blood pressure (!) 118/93, pulse 70, temperature 98.9 F (37.2 C), resp. rate 18, height 5\' 2"  (1.575 m), weight 57 kg (125 lb 10.6 oz), SpO2 100 %.  Intake/Output Summary (Last 24 hours) at 01/29/2018 0941 Last data filed at 01/28/2018 1600 Gross per 24 hour  Intake 490 ml  Output -  Net 490 ml   Filed Weights   01/25/18 0500 01/26/18 0600 01/26/18 1537  Weight: 60.9 kg (134 lb 4.2 oz) 62.3 kg (137 lb 5.6 oz) 57 kg (125 lb 10.6 oz)    Exam: Awake Alert, Oriented *3, No new F.N deficits, Normal affect .AT,PERRAL Supple Neck,No JVD, No cervical  lymphadenopathy appriciated.  Symmetrical Chest wall movement, Good air movement bilaterally, CTAB RRR,No Gallops,Rubs or new Murmurs, No Parasternal Heave +ve B.Sounds, Abd Soft, Non tender, No organomegaly appriciated, No rebound -guarding or rigidity. No Cyanosis, Clubbing or edema, No new Rash or bruise   PERTINENT RADIOLOGIC STUDIES: Ct Abdomen Pelvis Wo Contrast  Result Date: 01/20/2018 CLINICAL DATA:  History of pancreatitis EXAM: CT ABDOMEN AND PELVIS WITHOUT CONTRAST TECHNIQUE: Multidetector CT imaging of the abdomen and pelvis was performed following the standard protocol without IV contrast. COMPARISON:  01/16/2018 FINDINGS: Lower chest: Moderate to large bilateral pleural effusions are seen these are similar to a CT of the chest from the previous day but increased when compared with the prior CT examination. Bilateral lower lobe consolidation is noted as well. Hepatobiliary: Liver and gallbladder are within normal limits as visualized. Pancreas: The pancreas again demonstrates significant enlargement with peripancreatic inflammatory change in consistent with known pancreatitis. Lack of IV contrast somewhat limits evaluation of the gland itself. Spleen: Spleen is within normal limits. Adrenals/Urinary Tract: The adrenal glands and kidneys show no focal abnormality. No calculi or obstructive changes are seen. The bladder is decompressed by Foley catheter. Stomach/Bowel: Nasogastric catheter is noted within the stomach. Some thickening of the colonic wall is noted likely reactive to the free fluid within the abdomen. The appendix appears within normal limits. No obstructive changes are seen. Vascular/Lymphatic: No significant vascular findings are present. No enlarged abdominal or pelvic lymph nodes. Reproductive: Uterus and bilateral adnexa are unremarkable. Other: Free fluid is noted within the pelvis and to a lesser degree in the upper abdomen this has increased somewhat in the interval from  prior exam. Musculoskeletal: No acute or significant osseous findings. IMPRESSION: Changes of acute pancreatitis relatively stable when compared with the prior exam. Considerable peripancreatic fluid as well as free fluid in the abdomen is seen. Wall thickening within the colon as described likely of a reactive nature given the free fluid within the abdomen. Bilateral pleural effusions and lower lobe consolidation new when compared with the prior CT of the abdomen. These changes have been previously described on CT of the chest. Electronically Signed   By: Alcide Clever M.D.   On: 01/20/2018 13:53   Ct Abdomen Pelvis Wo Contrast  Result Date: 01/16/2018 CLINICAL DATA:  Abdominal pain radiating to back EXAM: CT ABDOMEN AND PELVIS WITHOUT CONTRAST TECHNIQUE: Multidetector CT imaging of the abdomen and pelvis was performed following the standard protocol without oral or IV contrast. COMPARISON:  None. FINDINGS: Lower chest: Lung bases are clear. Hepatobiliary: There is a patent steatosis. No focal liver lesions are appreciable. Gallbladder wall is not appreciably thickened.  There is suspected sludge in gallbladder. There is no biliary duct dilatation. Pancreas: There is mild generalized prominence of the pancreas with what is felt to be a degree edema throughout the pancreas. There is surrounding peripancreatic fluid. Fluid tracks on the right laterally into the right lateral conal fascia region. There is loculated ascites throughout the anterior abdominal region with mesenteric thickening throughout the anterolateral abdominal wall regions extending inferior to the level of the pelvis. There is moderate ascites in the dependent portion of the pelvis as well which may partially loculated. Spleen: No splenic lesions are evident. Adrenals/Urinary Tract: Adrenals bilaterally appear unremarkable. Kidneys bilaterally show no evident mass or hydronephrosis on either side. There is no renal or ureteral calculus on either  side. Urinary bladder is midline with wall thickness within normal limits. Stomach/Bowel: There is mild wall thickening in multiple loops of small bowel, likely due to enteritis secondary to the pancreatitis. Similar changes are noted in the ascending and descending colon. There is no evident bowel obstruction. No free air or portal venous air. Vascular/Lymphatic: There is no abdominal aortic aneurysm. No vascular lesions are evident. There is no appreciable adenopathy in the abdomen or pelvis. Reproductive: Uterus is anteverted.  No evident pelvic mass. Other: Appendix appears normal. There is no evident abscess in the abdomen or pelvis. Musculoskeletal: No blastic or lytic bone lesions. No intramuscular or abdominal wall lesions are evident. IMPRESSION: 1. Findings indicative of acute pancreatitis. Pancreas appears edematous with peripancreatic fluid. There is fluid tracking throughout the anterolateral abdominal regions with mesenteric thickening in these areas felt to be due to pancreatitis. There is localized partially loculated ascites in the dependent portion of the pelvis. 2. Wall thickening involving multiple loops of small and large bowel, likely due to enterocolitis secondary to inflammation from the peripancreatic fluid/pancreatitis. No bowel obstruction. 3. No frank abscess in the abdomen or pelvis. Appendix appears normal. 4.  No renal or ureteral calculus.  No hydronephrosis. 5. Question sludge in gallbladder. Gallbladder wall does not appear appreciably thickened. 6.  Hepatic steatosis. Electronically Signed   By: Bretta Bang III M.D.   On: 01/16/2018 18:08   Ct Head Wo Contrast  Result Date: 01/18/2018 CLINICAL DATA:  Altered mental status. EXAM: CT HEAD WITHOUT CONTRAST TECHNIQUE: Contiguous axial images were obtained from the base of the skull through the vertex without intravenous contrast. COMPARISON:  CT scan of December 07, 2015. FINDINGS: Brain: No evidence of acute infarction,  hemorrhage, hydrocephalus, extra-axial collection or mass lesion/mass effect. Vascular: No hyperdense vessel or unexpected calcification. Skull: Normal. Negative for fracture or focal lesion. Sinuses/Orbits: Probable mild sphenoid sinusitis. Other: None. IMPRESSION: Normal head CT. Electronically Signed   By: Lupita Raider, M.D.   On: 01/18/2018 18:35   Ct Chest Wo Contrast  Result Date: 01/19/2018 CLINICAL DATA:  Follow-up pleural effusion EXAM: CT CHEST WITHOUT CONTRAST TECHNIQUE: Multidetector CT imaging of the chest was performed following the standard protocol without IV contrast. COMPARISON:  Chest x-ray from earlier in the same day FINDINGS: Cardiovascular: Somewhat limited due to the lack of IV contrast. No cardiac enlargement is noted. The thoracic aorta is within normal limits. Left subclavian central line is noted in the mid superior vena cava. Mediastinum/Nodes: Endotracheal tube and nasogastric catheter are noted. The thoracic inlet is within normal limits. No definitive hilar or mediastinal adenopathy is noted although again somewhat limited evaluation due to the lack of IV contrast. Lungs/Pleura: Bilateral lower lobe infiltrate is noted left greater than right with associated  moderate effusions left greater than right. Additionally cough left upper lobe infiltrate is noted. These are similar to that seen on recent chest x-ray and again worsened when compared with previous exams. Upper Abdomen: Within normal limits. Musculoskeletal: Within normal limits. IMPRESSION: Patchy multifocal infiltrates left greater than right with associated moderate effusions left greater than right. Electronically Signed   By: Alcide Clever M.D.   On: 01/19/2018 12:29   US Abdomen Complete  Result Date: 01/18/2018 CLINICAL DATA:  Pancreatitis.  Abdominal distension EXAM: ABDOMEN ULTRASOUND COMPLETE COMPARISON:  CT abdomen and pelvis Jan 16, 2018 FINDINGS: Gallbladder: No gallstones are appreciable. There is mild  sludge in gallbladder. Gallbladder wall does not appear thickened. There is slight pericholecystic fluid, however. No sonographic Murphy sign noted by sonographer. Common bile duct: Diameter: 6 mm. No intrahepatic, common hepatic, or common bile duct dilatation. Liver: No focal lesion identified. Liver echogenicity is increased. Portal vein is patent on color Doppler imaging with normal direction of blood flow towards the liver. IVC: No abnormality visualized. Pancreas: Pancreas is enlarged and edematous. The echotexture of the pancreas is inhomogeneous. There is slight peripancreatic fluid. No evident pancreatic mass or pseudocyst is seen by ultrasound. Spleen: Size and appearance within normal limits. Right Kidney: Length: 11.7 cm. Echogenicity within normal limits. No mass or hydronephrosis visualized. Left Kidney: Length: 11.5 cm. Echogenicity within normal limits. No mass or hydronephrosis visualized. Abdominal aorta: No aneurysm visualized. Other findings: There is a mild degree of ascites. IMPRESSION: 1. Changes of acute pancreatitis. No well-defined pancreatic mass or pseudocyst. 2. Slight pericholecystic fluid. Gallbladder wall is not appear appreciably thickened. There is mild sludge in gallbladder and gallstones. Possibility of a degree of acalculus cholecystitis is of concern. This finding may warrant nuclear medicine hepatobiliary imaging study to assess for cystic duct patency. 3. Increase in liver echogenicity, a finding most likely indicative of hepatic steatosis. While no focal liver lesions are evident, it must be cautioned that the sensitivity of ultrasound for detection of focal liver lesions is diminished in this circumstance. 4.  Slight ascites. Electronically Signed   By: Bretta Bang III M.D.   On: 01/18/2018 11:03   Dg Chest Port 1 View  Result Date: 01/27/2018 CLINICAL DATA:  Shortness of breath. EXAM: PORTABLE CHEST 1 VIEW COMPARISON:  01/25/2018 FINDINGS: Feeding tube and  subclavian central line have been removed. The right chest is clear. There is persistent left lower lobe atelectasis with a left effusion. Aeration may be slightly improved. IMPRESSION: Slight radiographic improvement. Left effusion with left lower lobe atelectasis. Electronically Signed   By: Paulina Fusi M.D.   On: 01/27/2018 09:44   Dg Chest Port 1 View  Result Date: 01/25/2018 CLINICAL DATA:  Status postextubation.  Pleural effusion. EXAM: PORTABLE CHEST 1 VIEW COMPARISON:  Jan 22, 2018 FINDINGS: There is airspace consolidation in the left lower lobe with moderate left pleural effusion, stable. Right lung is clear. Heart is mildly enlarged with pulmonary vascularity normal. Endotracheal tube is been removed. No pneumothorax. Central catheter tip is in the superior vena cava. Feeding tube tip is below the diaphragm. IMPRESSION: No pneumothorax. Tube and catheter positions as described. Persistent left lower lobe consolidation with moderate left pleural effusion. Right lung clear. Stable cardiac silhouette. Electronically Signed   By: Bretta Bang III M.D.   On: 01/25/2018 07:20   Dg Chest Port 1 View  Result Date: 01/22/2018 CLINICAL DATA:  27 year old female with acute pancreatitis. Pleural effusions and pneumonia. Intubated. EXAM: PORTABLE CHEST 1 VIEW  COMPARISON:  01/21/2018 and earlier. FINDINGS: Portable AP semi upright view at 0546 hours. Endotracheal tube tip in good position at the level the clavicles. Stable enteric tube, side hole up the level of the mid stomach. Stable left subclavian central line. Continued confluent in veiling opacity throughout the left lung, sparing the apex. Mediastinal contours remain normal. Decreased veiling opacity in the right lung, which now appears clear allowing for portable technique. No pneumothorax. Mild gaseous distension of the stomach but otherwise paucity of upper abdominal bowel gas. No acute osseous abnormality identified. IMPRESSION: 1. Stable lines  and tubes. 2. Continued left pleural effusion with atelectasis and/or pneumonia. 3. Improved right lung ventilation, regressed right pleural effusion. 4. No new cardiopulmonary abnormality. Electronically Signed   By: Odessa Fleming M.D.   On: 01/22/2018 08:51   Dg Chest Port 1 View  Result Date: 01/21/2018 CLINICAL DATA:  Intubation. EXAM: PORTABLE CHEST 1 VIEW COMPARISON:  CT 01/19/2018.  Chest x-ray 01/19/2018. FINDINGS: Endotracheal tube, NG tube, left subclavian line stable position. Heart size stable. Diffuse bilateral pulmonary infiltrates/edema, progressed from prior exam. Persistent left-sided pleural effusion. No pneumothorax. IMPRESSION: 1.  Lines and tubes in stable position. 2. Progressive bilateral pulmonary infiltrates/edema. Persistent left pleural effusion. Electronically Signed   By: Maisie Fus  Register   On: 01/21/2018 08:38   Dg Chest Port 1 View  Result Date: 01/19/2018 CLINICAL DATA:  Check endotracheal tube placement EXAM: PORTABLE CHEST 1 VIEW COMPARISON:  01/18/2018 FINDINGS: Cardiac shadow is stable. Left-sided pleural effusion is again noted and slightly increased when compared with the prior exam. Increasing consolidation in the left base is noted as well. Endotracheal tube and nasogastric catheter are again seen in satisfactory position. Left subclavian central line is noted as well. The right lung remains clear. IMPRESSION: Increase in left basilar consolidation and left pleural effusion. Electronically Signed   By: Alcide Clever M.D.   On: 01/19/2018 08:51   Dg Chest Port 1 View  Result Date: 01/18/2018 CLINICAL DATA:  Followup for respiratory failure. EXAM: PORTABLE CHEST 1 VIEW COMPARISON:  01/17/2018 and older exams. FINDINGS: Left pleural effusion which has increased in size from the previous day's study. There is persistent left lung base opacity consistent with atelectasis or possibly pneumonia. Right lung remains clear. No right pleural effusion.  No pneumothorax. Endotracheal  tube, left subclavian central venous line and nasal/orogastric tube are stable and well positioned. IMPRESSION: 1. Mild interval worsening of lung aeration on the left with an increase in left pleural fluid. There is persistent left lung base opacity consistent with pneumonia or atelectasis. 2. No other change. Right lung remains clear. Support apparatus is stable and well positioned. Electronically Signed   By: Amie Portland M.D.   On: 01/18/2018 08:55   Dg Chest Portable 1 View  Result Date: 01/17/2018 CLINICAL DATA:  Post intubation EXAM: PORTABLE CHEST 1 VIEW COMPARISON:  05/02/2015 FINDINGS: An endotracheal tube has been placed with tip measuring 1.8 cm above the carina. Enteric tube tip is off the field of view but below the left hemidiaphragm. Shallow inspiration. Heart size and pulmonary vascularity are normal. Lungs are clear and expanded. Mediastinal contours appear intact. Thoracic scoliosis convex towards the right. IMPRESSION: Appliances appear in satisfactory position. No active pulmonary disease. Electronically Signed   By: Burman Nieves M.D.   On: 01/17/2018 06:25   Dg Chest Port 1v Same Day  Result Date: 01/17/2018 CLINICAL DATA:  Displacement of central venous catheter. EXAM: PORTABLE CHEST 1 VIEW COMPARISON:  01/17/2018 at  6:08 a.m. FINDINGS: Since prior exam, a left subclavian central venous line has been placed. The tip projects at the caval atrial junction. No pneumothorax. The endotracheal tube and the nasal/orogastric tube are stable and well positioned. There is new left lung base opacity mostly silhouetting the left hemidiaphragm. This may reflect atelectasis or a rapidly evolving infection. Atelectasis is favored. IMPRESSION: 1. New left subclavian central venous line is well positioned, tip at the caval atrial junction. No pneumothorax. 2. New left lung base opacity, most likely atelectasis. Consider pneumonia if there are consistent clinical findings. Electronically Signed    By: Amie Portland M.D.   On: 01/17/2018 12:27   Dg Abd Portable 1v  Result Date: 01/23/2018 CLINICAL DATA:  Evaluate NG tube placement EXAM: PORTABLE ABDOMEN - 1 VIEW COMPARISON:  CT scan Jan 20, 2018 FINDINGS: The feeding tube terminates in the left abdomen, likely in the proximal jejunum. Air-filled mildly prominent loops of small bowel in the abdomen measure up to 3.4 cm. IMPRESSION: 1. The feeding tube is in good position. 2. Air-filled mildly prominent/dilated loops of small bowel. This could represent developing ileus or small-bowel obstruction. Electronically Signed   By: Gerome Sam III M.D   On: 01/23/2018 11:27   Dg Swallowing Func-speech Pathology  Result Date: 01/29/2018 Objective Swallowing Evaluation: Type of Study: MBS-Modified Barium Swallow Study  Patient Details Name: Donna Short MRN: [id number] Date of Birth: 1990/12/31 Today's Date: 01/29/2018 Time: SLP Start Time (ACUTE ONLY): 1020 -SLP Stop Time (ACUTE ONLY): 1037 SLP Time Calculation (min) (ACUTE ONLY): 17 min Past Medical History: Past Medical History: Diagnosis Date . Asthma  . Hypertension  . Pancreatitis 01/2018 . Seizures (HCC)  Past Surgical History: Past Surgical History: Procedure Laterality Date . WISDOM TOOTH EXTRACTION   HPI: 27 yr old female with PMHx HTN, Asthma, Seizures and known ETOH abuse presented 5/25 with abdominal pain, nausea, vomiting. CTA/P shows pancreatitis. HTN and Tachycardic last ETOH was on 5/21, now s/p seizure, intubated 5/26-5/31/19.  Subjective: pt nervous about trying POs Assessment / Plan / Recommendation CHL IP CLINICAL IMPRESSIONS 01/28/2018 Clinical Impression Pt has a mild, likely cognitively-based oral dysphagia characterized by lingual pumping and slow transit of purees. She declined any other solid textures. Her pharyngeal phase of swallow was [facility]. One wet-sounding cough noted during testing was not related to airway compromise. The barium tablet appeared to have slow clearance through the  esophagus, particularly at the entrance of the stomach, although it did clear (MD not present to confirm). Pt does however describe regurgitation of water earlier this morning. Upon questioning, it appears as though she drank a large volume of water and then tried to lie down. Recommend starting with a full liquid diet for cognitive and possible esophageal issues. SLP will f/u for tolerance with good prognosis to advance.  SLP Visit Diagnosis Dysphagia, unspecified (R13.10) Attention and concentration deficit following -- Frontal lobe and executive function deficit following -- Impact on safety and function --   CHL IP TREATMENT RECOMMENDATION 01/25/2018 Treatment Recommendations Therapy as outlined in treatment plan below   Prognosis 01/25/2018 Prognosis for Safe Diet Advancement Good Barriers to Reach Goals Cognitive deficits Barriers/Prognosis Comment -- CHL IP DIET RECOMMENDATION 01/28/2018 SLP Diet Recommendations -- Liquid Administration via -- Medication Administration -- Compensations Minimize environmental distractions;Slow rate;Small sips/bites;Follow solids with liquid Postural Changes --   CHL IP OTHER RECOMMENDATIONS 01/25/2018 Recommended Consults Consider esophageal assessment Oral Care Recommendations Oral care BID Other Recommendations --   CHL IP FOLLOW UP RECOMMENDATIONS  01/28/2018 Follow up Recommendations Other (comment)   CHL IP FREQUENCY AND DURATION 01/25/2018 Speech Therapy Frequency (ACUTE ONLY) min 2x/week Treatment Duration 2 weeks      CHL IP ORAL PHASE 01/25/2018 Oral Phase Impaired Oral - Pudding Teaspoon -- Oral - Pudding Cup -- Oral - Honey Teaspoon -- Oral - Honey Cup -- Oral - Nectar Teaspoon -- Oral - Nectar Cup -- Oral - Nectar Straw -- Oral - Thin Teaspoon -- Oral - Thin Cup WFL Oral - Thin Straw WFL Oral - Puree Delayed oral transit;Reduced posterior propulsion;Lingual pumping Oral - Mech Soft -- Oral - Regular -- Oral - Multi-Consistency -- Oral - Pill WFL Oral Phase - Comment --  CHL IP  PHARYNGEAL PHASE 01/25/2018 Pharyngeal Phase WFL Pharyngeal- Pudding Teaspoon -- Pharyngeal -- Pharyngeal- Pudding Cup -- Pharyngeal -- Pharyngeal- Honey Teaspoon -- Pharyngeal -- Pharyngeal- Honey Cup -- Pharyngeal -- Pharyngeal- Nectar Teaspoon -- Pharyngeal -- Pharyngeal- Nectar Cup -- Pharyngeal -- Pharyngeal- Nectar Straw -- Pharyngeal -- Pharyngeal- Thin Teaspoon -- Pharyngeal -- Pharyngeal- Thin Cup -- Pharyngeal -- Pharyngeal- Thin Straw -- Pharyngeal -- Pharyngeal- Puree -- Pharyngeal -- Pharyngeal- Mechanical Soft -- Pharyngeal -- Pharyngeal- Regular -- Pharyngeal -- Pharyngeal- Multi-consistency -- Pharyngeal -- Pharyngeal- Pill -- Pharyngeal -- Pharyngeal Comment --  CHL IP CERVICAL ESOPHAGEAL PHASE 01/25/2018 Cervical Esophageal Phase WFL Pudding Teaspoon -- Pudding Cup -- Honey Teaspoon -- Honey Cup -- Nectar Teaspoon -- Nectar Cup -- Nectar Straw -- Thin Teaspoon -- Thin Cup -- Thin Straw -- Puree -- Mechanical Soft -- Regular -- Multi-consistency -- Pill -- Cervical Esophageal Comment -- No flowsheet data found. Maxcine Ham 01/29/2018, 8:46 AM  Maxcine Ham, M.A. CCC-SLP [phone]               PERTINENT LAB RESULTS: CBC: Recent Labs    01/27/18 0612 01/28/18 0612  WBC 15.1* 12.9*  HGB 7.7* 7.7*  HCT 23.5* 23.3*  PLT 396 424*   CMET CMP     Component Value Date/Time   NA 139 01/28/2018 0612   K 3.3 (L) 01/28/2018 0612   CL 103 01/28/2018 0612   CO2 22 01/28/2018 0612   GLUCOSE 80 01/28/2018 0612   BUN 8 01/28/2018 0612   CREATININE 0.91 01/28/2018 0612   CALCIUM 9.1 01/28/2018 0612   PROT 6.4 (L) 01/26/2018 0425   ALBUMIN 2.1 (L) 01/26/2018 0425   AST 33 01/26/2018 0425   ALT 15 01/26/2018 0425   ALKPHOS 47 01/26/2018 0425   BILITOT 0.9 01/26/2018 0425   GFRNONAA >60 01/28/2018 0612   GFRAA >60 01/28/2018 0612    GFR Estimated Creatinine Clearance: 72.1 mL/min (by C-G formula based on SCr of 0.91 mg/dL). No results for input(s): LIPASE, AMYLASE in the  last 72 hours. No results for input(s): CKTOTAL, CKMB, CKMBINDEX, TROPONINI in the last 72 hours. Invalid input(s): POCBNP No results for input(s): DDIMER in the last 72 hours. No results for input(s): HGBA1C in the last 72 hours. No results for input(s): CHOL, HDL, LDLCALC, TRIG, CHOLHDL, LDLDIRECT in the last 72 hours. No results for input(s): TSH, T4TOTAL, T3FREE, THYROIDAB in the last 72 hours.  Invalid input(s): FREET3 No results for input(s): VITAMINB12, FOLATE, FERRITIN, TIBC, IRON, RETICCTPCT in the last 72 hours. Coags: No results for input(s): INR in the last 72 hours.  Invalid input(s): PT Microbiology: Recent Results (from the past 240 hour(s))  Culture, blood (single)     Status: None   Collection Time: 01/21/18  7:55 PM  Result Value Ref  Range Status   Specimen Description BLOOD HAND LEFT  Final   Special Requests   Final    BOTTLES DRAWN AEROBIC AND ANAEROBIC Blood Culture results may not be optimal due to an excessive volume of blood received in culture bottles   Culture   Final    NO GROWTH 5 DAYS Performed at [facility], [address] [place], [place] [address]    Report Status 01/26/2018 FINAL  Final  C difficile quick scan w PCR reflex     Status: Abnormal   Collection Time: 01/24/18 11:36 AM  Result Value Ref Range Status   C Diff antigen POSITIVE (A) NEGATIVE Final   C Diff toxin NEGATIVE NEGATIVE Final   C Diff interpretation Results are indeterminate. See PCR results.  Final    Comment: Performed at [facility], [address] [place], [place] [address]  C. Diff by PCR, Reflexed     Status: None   Collection Time: 01/24/18 11:36 AM  Result Value Ref Range Status   Toxigenic C. Difficile by PCR NEGATIVE NEGATIVE Final    Comment: Patient is colonized with non toxigenic C. difficile. May not need treatment unless significant symptoms are present. Performed at [facility], [address] [place], [place] [address]     FURTHER  DISCHARGE INSTRUCTIONS:  Get Medicines reviewed and adjusted: Please take all your medications with you for your next visit with your Primary MD  Laboratory/radiological data: Please request your Primary MD to go over all hospital tests and procedure/radiological results at the follow up, please ask your Primary MD to get all Hospital records sent to his/her office.  In some cases, they will be blood work, cultures and biopsy results pending at the time of your discharge. Please request that your primary care M.D. goes through all the records of your hospital data and follows up on these results.  Also Note the following: If you experience worsening of your admission symptoms, develop shortness of breath, life threatening emergency, suicidal or homicidal thoughts you must seek medical attention immediately by calling 911 or calling your MD immediately  if symptoms less severe.  You must read complete instructions/literature along with all the possible adverse reactions/side effects for all the Medicines you take and that have been prescribed to you. Take any new Medicines after you have completely understood and accpet all the possible adverse reactions/side effects.   Do not drive when taking Pain medications or sleeping medications (Benzodaizepines)  Do not take more than prescribed Pain, Sleep and Anxiety Medications. It is not advisable to combine anxiety,sleep and pain medications without talking with your primary care practitioner  Special Instructions: If you have smoked or chewed Tobacco  in the last 2 yrs please stop smoking, stop any regular Alcohol  and or any Recreational drug use.  Wear Seat belts while driving.  Please note: You were cared for by a hospitalist during your hospital stay. Once you are discharged, your primary care physician will handle any further medical issues. Please note that NO REFILLS for any discharge medications will be authorized once you are discharged,  as it is imperative that you return to your primary care physician (or establish a relationship with a primary care physician if you do not have one) for your post hospital discharge needs so that they can reassess your need for medications and monitor your lab values.  Total Time spent coordinating discharge including counseling, education and face to face time equals  45 minutes.  SignedJeoffrey Massed 01/29/2018 9:41 AM

## 2018-02-11 ENCOUNTER — Encounter

## 2018-02-11 ENCOUNTER — Institutional Professional Consult (permissible substitution): Payer: Managed Care, Other (non HMO) | Admitting: Neurology

## 2018-02-11 ENCOUNTER — Encounter: Payer: Self-pay | Admitting: Neurology

## 2018-02-11 ENCOUNTER — Encounter: Payer: Self-pay | Admitting: *Deleted

## 2018-02-11 ENCOUNTER — Telehealth: Payer: Self-pay | Admitting: Neurology

## 2018-02-11 ENCOUNTER — Ambulatory Visit: Payer: Managed Care, Other (non HMO) | Admitting: Neurology

## 2018-02-11 VITALS — BP 94/68 | HR 97 | Ht 62.0 in | Wt 104.0 lb

## 2018-02-11 DIAGNOSIS — Z8719 Personal history of other diseases of the digestive system: Secondary | ICD-10-CM

## 2018-02-11 DIAGNOSIS — R569 Unspecified convulsions: Secondary | ICD-10-CM | POA: Diagnosis not present

## 2018-02-11 MED ORDER — LEVETIRACETAM 500 MG PO TABS: 500.0000 mg | ORAL_TABLET | Freq: Two times a day (BID) | ORAL | 4 refills | Status: DC

## 2018-02-11 NOTE — Telephone Encounter (Signed)
Please help her find a primary care physician soon.

## 2018-02-11 NOTE — Telephone Encounter (Signed)
[address] [place] [id number] [address] arrive at 1:00 for 1:15 pm . Patient will see Dr. Lowella FairyAllison Wolf. Patient was is aware of all details.

## 2018-02-11 NOTE — Progress Notes (Signed)
PATIENT: Donna Short DOB: 1990/09/11  Chief Complaint  Patient presents with  . Seizures    She is here with her godfather, Donna Short.  Last seen here on 01/22/16.  She is following up from a recent hospital visit.  States she had a seizure while there.     HISTORICAL  Donna Short is a 27 year old right-handed female, accompanied by her godfather Donna Short, seen in refer by emergency room for evaluation of her only seizure in December 19 2015.  In April 14th 2017,She had acute onset generalized tonic-clonic seizure at work in [employer], she woke up in the ambulance, 3 hours later, while at emergency room, she had her second generalized tonic-clonic seizure, she has been treated with Keppra 500 mg twice a day, tolerating medication well.  I have personally reviewed CAT scan of the brain in April 2017, no intra-cranial abnormality, right parietal skull abrasion,  Laboratory evaluation showed mild anemia, with hemoglobin 11.8, normal CMP, negative pregnancy test.  She denies focal symptoms, she was developmentally normal, denier history of febrile seizure, there was no family history of epilepsy.  She works at [employer] for [employer]  UPDATE May 30th 2017: EEG was normal in May 27th 2017.  She still works at [employer], She could not afford MRI brain. She spent most of today's visit complaining her work-related stress    she is now taking Keppra 500mg  bid,  Noticed irritability, anxiety, mostly work related  UPDATE February 12 2018: She has lost follow-up since last visit in May 2017, she was supposed to take lamotrigine 100 mg twice a day, but not compliant with her medications, she was admitted to the hospital in July 2019 for acute pancreatitis, related to alcohol use, hospital course was also complicated by acute encephalopathy, possible alcohol withdrawal seizure, possible aspiration pneumonia, requiring intubation, during her hospital stay,  there was reported seizure activity,  CT head without contrast on Jan 18, 2018 was normal.  Discharge laboratory on January 28, 2018 showed low potassium 3.3, normal creatinine 0.91,WBC remains elevated 12.9, anemia hemoglobin of 7.7, normal TSH, folic acid, B12 396,  CT abdomen on Jan 20, 2018 showed changes of acute pancreatitis, considerable peripancreatic fluid, and the free fluid in the abdomen, wall thickening within the colon, bilateral pleural effusion, lower lobe consolidation,  She now complains of feeling generalized fatigue,  Blood pressure is 94/68, heart rate of 97, she was discharged with prescription of labetalol 200 mg twice a day  REVIEW OF SYSTEMS: Full 14 system review of systems performed and notable only for seizure  ALLERGIES: Allergies  Allergen Reactions  . Benadryl [Diphenhydramine Hcl (Sleep)] Rash  . Latex Rash  . Penicillins Itching and Rash    Has patient had a PCN reaction causing immediate rash, facial/tongue/throat swelling, SOB or lightheadedness with hypotension: Yes Has patient had a PCN reaction causing severe rash involving mucus membranes or skin necrosis: No Has patient had a PCN reaction that required hospitalization: Yes Has patient had a PCN reaction occurring within the last 10 years: No If all of the above answers are "NO", then may proceed with Cephalosporin use.;  . Shrimp [Shellfish Allergy] Hives and Rash    HOME MEDICATIONS: Current Outpatient Medications  Medication Sig Dispense Refill  . feeding supplement, ENSURE ENLIVE, (ENSURE ENLIVE) LIQD Take 237 mLs by mouth 3 (three) times daily between meals. 90 Bottle 0  . labetalol (NORMODYNE) 200 MG tablet Take 1 tablet (200 mg total) by  mouth 2 (two) times daily. 60 tablet 0  . levETIRAcetam (KEPPRA) 500 MG tablet Take 1 tablet (500 mg total) by mouth 2 (two) times daily. 60 tablet 0  . thiamine 100 MG tablet Take 1 tablet (100 mg total) by mouth daily. 30 tablet 0   No current  facility-administered medications for this visit.     PAST MEDICAL HISTORY: Past Medical History:  Diagnosis Date  . Asthma   . Hypertension   . Pancreatitis 01/2018  . Seizures (HCC)     PAST SURGICAL HISTORY: Past Surgical History:  Procedure Laterality Date  . WISDOM TOOTH EXTRACTION      FAMILY HISTORY: Family History  Problem Relation Age of Onset  . Diabetes Father   . Breast cancer Mother     SOCIAL HISTORY:  Social History   Socioeconomic History  . Marital status: Single    Spouse name: Not on file  . Number of children: 0  . Years of education: 2 years college  . Highest education level: Not on file  Occupational History  . Occupation: Clinical research associate at [employer]  . Financial resource strain: Not on file  . Food insecurity:    Worry: Not on file    Inability: Not on file  . Transportation needs:    Medical: Not on file    Non-medical: Not on file  Tobacco Use  . Smoking status: Former Smoker    Types: Cigarettes    Last attempt to quit: 01/21/2018    Years since quitting: 0.0  . Smokeless tobacco: Never Used  Substance and Sexual Activity  . Alcohol use: Not Currently    Alcohol/week: 0.0 oz    Comment: " quit drinking before my birthday on 01/10/2018 "  . Drug use: No  . Sexual activity: Not Currently  Lifestyle  . Physical activity:    Days per week: Not on file    Minutes per session: Not on file  . Stress: Not on file  Relationships  . Social connections:    Talks on phone: Not on file    Gets together: Not on file    Attends religious service: Not on file    Active member of club or organization: Not on file    Attends meetings of clubs or organizations: Not on file    Relationship status: Not on file  . Intimate partner violence:    Fear of current or ex partner: Not on file    Emotionally abused: Not on file    Physically abused: Not on file    Forced sexual activity: Not on file  Other Topics Concern  . Not on file    Social History Narrative   Lives at home alone.   Right-handed.   No caffeine use.     PHYSICAL EXAM   Vitals:   02/11/18 0953  BP: 94/68  Pulse: 97  Weight: 104 lb (47.2 kg)  Height: 5\' 2"  (1.575 m)    Not recorded      Body mass index is 19.02 kg/m.  PHYSICAL EXAMNIATION:  Gen: NAD, conversant, well nourised, obese, well groomed                     Cardiovascular: Regular rate rhythm, no peripheral edema, warm, nontender. Eyes: Conjunctivae clear without exudates or hemorrhage Neck: Supple, no carotid bruits. Pulmonary: Clear to auscultation bilaterally   NEUROLOGICAL EXAM:  MENTAL STATUS: Speech:    Speech is normal; fluent and spontaneous with  normal comprehension.  Cognition:     Orientation to time, place and person     Normal recent and remote memory     Normal Attention span and concentration     Normal Language, naming, repeating,spontaneous speech     Fund of knowledge   CRANIAL NERVES: CN II: Visual fields are full to confrontation. Fundoscopic exam is normal with sharp discs and no vascular changes. Pupils are round equal and briskly reactive to light. CN III, IV, VI: extraocular movement are normal. No ptosis. CN V: Facial sensation is intact to pinprick in all 3 divisions bilaterally. Corneal responses are intact.  CN VII: Face is symmetric with normal eye closure and smile. CN VIII: Hearing is normal to rubbing fingers CN IX, X: Palate elevates symmetrically. Phonation is normal. CN XI: Head turning and shoulder shrug are intact CN XII: Tongue is midline with normal movements and no atrophy.  MOTOR: There is no pronator drift of out-stretched arms. Muscle bulk and tone are normal. Muscle strength is normal.  REFLEXES: Reflexes are 2+ and symmetric at the biceps, triceps, knees, and ankles. Plantar responses are flexor.  SENSORY: Intact to light touch, pinprick, positional sensation and vibratory sensation are intact in fingers and  toes.  COORDINATION: Rapid alternating movements and fine finger movements are intact. There is no dysmetria on finger-to-nose and heel-knee-shin.    GAIT/STANCE: Posture is normal. Gait is steady with normal steps, base, arm swing, and turning. Heel and toe walking are normal. Tandem gait is normal.  Romberg is absent.   DIAGNOSTIC DATA (LABS, IMAGING, TESTING) - I reviewed patient records, labs, notes, testing and imaging myself where available.   ASSESSMENT AND PLAN  Romona CurlsShamika M Nigg is a 27 y.o. female    Seizure  Normal CT head of the brain  Continue Keppra 500 mg twice daily Acute pancreatitis in June 2019, Anemia,  Most recent A1c was 7.7,  We will help her find a primary care to continue follow-up  Levert FeinsteinYijun Mozell [place], M.D. Ph.D.  [facility] [address], [address] [place], [place] [address] Ph: [phone] Fax: [phone]  CC: Referring Provider

## 2018-02-11 NOTE — Patient Instructions (Signed)
[facility] [address] . [place]  Main Line: [phone] . Fax: [phone]   [place] [facility] [place] [address] . [place]  Main Line: [phone] . Behavioral Medicine: [phone] . Fax: [phone]   Primary Care, Sports Medicine, Behavioral Medicine  Map it   [facility] [address] . [place]  Main Line: [phone] . Behavioral Medicine: [phone] . Fax: [phone]

## 2018-02-12 NOTE — Addendum Note (Signed)
Addended by: Levert FeinsteinYAN, Melitza Metheny on: 02/12/2018 11:37 AM   Modules accepted: Level of Service

## 2018-02-15 ENCOUNTER — Ambulatory Visit (INDEPENDENT_AMBULATORY_CARE_PROVIDER_SITE_OTHER): Payer: Managed Care, Other (non HMO) | Admitting: Family Medicine

## 2018-02-15 ENCOUNTER — Encounter: Payer: Self-pay | Admitting: Family Medicine

## 2018-02-15 VITALS — BP 94/76 | HR 80 | Temp 98.6°F | Ht 62.0 in | Wt 104.8 lb

## 2018-02-15 DIAGNOSIS — D539 Nutritional anemia, unspecified: Secondary | ICD-10-CM

## 2018-02-15 DIAGNOSIS — H6123 Impacted cerumen, bilateral: Secondary | ICD-10-CM

## 2018-02-15 DIAGNOSIS — E876 Hypokalemia: Secondary | ICD-10-CM | POA: Diagnosis not present

## 2018-02-15 DIAGNOSIS — I952 Hypotension due to drugs: Secondary | ICD-10-CM | POA: Diagnosis not present

## 2018-02-15 LAB — COMPREHENSIVE METABOLIC PANEL
ALK PHOS: 35 U/L — AB (ref 39–117)
ALT: 18 U/L (ref 0–35)
AST: 27 U/L (ref 0–37)
Albumin: 4.3 g/dL (ref 3.5–5.2)
BILIRUBIN TOTAL: 0.3 mg/dL (ref 0.2–1.2)
BUN: 21 mg/dL (ref 6–23)
CO2: 20 mEq/L (ref 19–32)
Calcium: 10.6 mg/dL — ABNORMAL HIGH (ref 8.4–10.5)
Chloride: 101 mEq/L (ref 96–112)
Creatinine, Ser: 0.77 mg/dL (ref 0.40–1.20)
GFR: 113.9 mL/min (ref >60.00)
Glucose, Bld: 77 mg/dL (ref 70–99)
POTASSIUM: 4.2 meq/L (ref 3.5–5.1)
SODIUM: 134 meq/L — AB (ref 135–145)
TOTAL PROTEIN: 8.8 g/dL — AB (ref 6.0–8.3)

## 2018-02-15 LAB — CBC WITH DIFFERENTIAL/PLATELET
BASOS ABS: 0.1 10*3/uL (ref 0.0–0.1)
BASOS PCT: 0.7 % (ref 0.0–3.0)
Eosinophils Absolute: 0.2 10*3/uL (ref 0.0–0.7)
Eosinophils Relative: 2.3 % (ref 0.0–5.0)
HEMATOCRIT: 29.8 % — AB (ref 36.0–46.0)
Hemoglobin: 10.1 g/dL — ABNORMAL LOW (ref 12.0–15.0)
LYMPHS PCT: 25.7 % (ref 12.0–46.0)
Lymphs Abs: 2.1 10*3/uL (ref 0.7–4.0)
MCHC: 33.8 g/dL (ref 30.0–36.0)
MCV: 108.8 fl — ABNORMAL HIGH (ref 78.0–100.0)
MONOS PCT: 7.8 % (ref 3.0–12.0)
Monocytes Absolute: 0.6 10*3/uL (ref 0.1–1.0)
NEUTROS ABS: 5.1 10*3/uL (ref 1.4–7.7)
Neutrophils Relative %: 63.5 % (ref 43.0–77.0)
PLATELETS: 288 10*3/uL (ref 150.0–400.0)
RBC: 2.74 Mil/uL — ABNORMAL LOW (ref 3.87–5.11)
RDW: 13.1 % (ref 11.5–15.5)
WBC: 8 10*3/uL (ref 4.0–10.5)

## 2018-02-15 LAB — VITAMIN B12: Vitamin B-12: 665 pg/mL (ref 211–911)

## 2018-02-15 LAB — FOLATE: Folate: 16.5 ng/mL (ref >5.9)

## 2018-02-15 MED ORDER — FOLIC ACID 1 MG PO TABS: 1.0000 mg | ORAL_TABLET | Freq: Every day | ORAL | 3 refills | Status: DC

## 2018-02-15 NOTE — Progress Notes (Addendum)
Patient: Donna Short MRN: [id number] DOB: 1990-11-20 PCP: Orland Mustard, MD     Subjective:  Chief Complaint  Patient presents with  . Establish Care    f/u on seizures-hospitalized for 3 wks    HPI: The patient is a 27 y.o. female who presents today for to establish care. She has past medical history significant for alcohol abuse, seizure disorder, asthma and recent hospitalization for pancreatitis with development of acute encephalopathy and possible alcohol withdrawal seizure. She developed subsequent pulmonary infiltrates from what was suspected aspiration pneumonia and required intubation and management in ICU. She was intubated on 5/25-5/31.   CT head without contrast was normal on 01/18/2018.  Other important labs: anemia with hgb of 7.7  Seizure disorder: hx of seizures and non compliance. She stopped taking medication and stopped following with neurology; however, had recent visit with them on 02/11/2018 following hospitalization. Currently on keppra 500mg  bid.   Found to have anemia in the hospital. Last check was 7.7 on her discharge 2 weeks ago. She is hypotensive. She is on a beta blocker and not sure why so likely masking a possible tachycardia.   Review of Systems  Constitutional: Positive for appetite change. Negative for fatigue and fever.  Respiratory: Negative for shortness of breath.   Cardiovascular: Negative for chest pain.  Gastrointestinal: Negative for nausea and vomiting.  Genitourinary: Negative.   Musculoskeletal: Negative for back pain and neck pain.  Neurological: Positive for seizures. Negative for dizziness and headaches.  Psychiatric/Behavioral: Negative for agitation and confusion. The patient is not nervous/anxious.     Allergies Patient is allergic to peanut-containing drug products; benadryl [diphenhydramine hcl (sleep)]; latex; penicillins; and shrimp [shellfish allergy].  Past Medical History Patient  has a past medical history of  Asthma, Hypertension, Pancreatitis (01/2018), and Seizures (HCC).  Surgical History Patient  has a past surgical history that includes Wisdom tooth extraction.  Family History Pateint's family history includes Breast cancer in her mother; Diabetes in her father.  Social History Patient  reports that she quit smoking about 3 weeks ago. Her smoking use included cigarettes. She has never used smokeless tobacco. She reports that she drank alcohol. She reports that she does not use drugs.    Objective: Vitals:   02/15/18 1323  BP: 94/76  Pulse: 80  Temp: 98.6 F (37 C)  TempSrc: Oral  Weight: 104 lb 12.8 oz (47.5 kg)  Height: 5\' 2"  (1.575 m)    Body mass index is 19.17 kg/m.  Physical Exam  Constitutional: She is oriented to person, place, and time. She appears well-developed and well-nourished.  Tobacco odor.   HENT:  Right Ear: External ear normal.  Left Ear: External ear normal.  Mouth/Throat: Oropharynx is clear and moist.  Tm with some cerumen impacted bilaterally   Eyes: Pupils are equal, round, and reactive to light. Conjunctivae and EOM are normal.  Neck: Normal range of motion. Neck supple. No thyromegaly present.  Cardiovascular: Normal rate, regular rhythm, normal heart sounds and intact distal pulses.  No murmur heard. Pulmonary/Chest: Effort normal and breath sounds normal.  Abdominal: Soft. Bowel sounds are normal. She exhibits no distension. There is tenderness (epigastric and LLQ).  Lymphadenopathy:    She has no cervical adenopathy.  Neurological: She is alert and oriented to person, place, and time. She displays normal reflexes. No cranial nerve deficit. Coordination normal.  Skin: Skin is warm and dry. No rash noted.  Psychiatric: She has a normal mood and affect. Her behavior is normal.  Vitals reviewed.     nurse will irrigate ears bilaterally.   Assessment/plan:  1. Macrocytic anemia Checking iron panel b12/folate. Will see what cbc is and  continue to monitor. Likely secondary to alcohol abuse. Starting folic acid as well with alcohol abuse/dependence. Started on thiamine in hospital.  - CBC with Differential/Platelet - Vitamin B12 - Folate - Iron, TIBC and Ferritin Panel  2. Hypokalemia Checking potassium.  - Comprehensive metabolic panel  3. Hypotension due to medication Not sure why she was started on beta blocker in hospital. Unsure if due to tachycardia or bump in troponin which was thought to be inflammatory/reactive and not cardiac related. Also she was pretty anemic with sepsis and a host of other issues that could cause tachycardia so would like to correct this before we start medication to treat this.  She is hypotensive. Neurology already started to wean down her metoprolol and I agree. Will get her off the beta blocker and then reassess her blood pressure/heart rate. Follow up in one month.   4. Alcohol abuse She states she has been sober for about 35 days. Not much support around her. Adding on folic acid with the thiamine. Would like to get her into counseling, but she can not drive at this time with seizure history. Also recommended AA. Will watch her closely and see if she has gotten into counseling when I see her back in one months time.   -needs tdap at next visit.  -ask about pap smear.   Return in about 1 month (around 03/17/2018) for blood pressure check .    Orland MustardAllison Donta Fuster, MD [place] [facility]   02/15/2018

## 2018-02-16 LAB — IRON,TIBC AND FERRITIN PANEL
%SAT: 23 % (calc) (ref 16–45)
Ferritin: 496 ng/mL — ABNORMAL HIGH (ref 16–154)
Iron: 93 ug/dL (ref 40–190)
TIBC: 410 ug/dL (ref 250–450)

## 2018-02-17 ENCOUNTER — Telehealth: Payer: Self-pay

## 2018-02-17 ENCOUNTER — Telehealth: Payer: Self-pay | Admitting: Family Medicine

## 2018-02-17 NOTE — Telephone Encounter (Signed)
Pt stopped in to drop off FMLA paperwork from her job that needs to be filled out.  Explained to pt that she will need appt with Dr. Artis FlockWolfe for paperwork to be be filled out and signed.  Pt verbalized understanding and appt scheduled for pt on 02/22/18 w/Dr. Artis FlockWolfe.

## 2018-02-17 NOTE — Telephone Encounter (Signed)
Previous appt scheduled for 02/22/18 was entered in error.  Pt coming in for appt w/Dr. Artis FlockWolfe 02/18/18 at 11am

## 2018-02-17 NOTE — Telephone Encounter (Signed)
Spoke to Donna Short and notified that her Her b12 and folate were also normal, but Dr. Artis FlockWolfe still wants her to take the folic acid that she prescribed her once/day.   Her anemia is much better. hgb is up to 10.1. Normal is greater than 12. She is not iron deficient, so Dr. Artis FlockWolfe wants to continue to watch this. Will recheck in about 3-6 months and see if she has corrected this.   Donna Short verbalized understanding and had no further questions

## 2018-02-17 NOTE — Telephone Encounter (Signed)
Please let her know her potassium is normal.   Her b12 and folate were also normal, but I still want her to take the folic acid that I prescribed her once/day.   Her anemia is much better. hgb is up to 10.1. Normal is greater than 12. She is not iron deficient, so I want to continue to watch this. Will recheck in about 3-6 months and see if she has corrected this.

## 2018-02-17 NOTE — Progress Notes (Signed)
Patient: Donna Short MRN: [id number] DOB: Jul 27, 1991 PCP: Orland MustardWolfe, Sabrina Keough, MD     Subjective:  No chief complaint on file.   HPI: The patient is a 27 y.o. female who presents today for FMLA and short term disability paper work to be filled out. She also has been weaning down on her labetalol and we will check her bP today. Hospital discharge note is done and they put  Her on this for hypertension and tachycardia. She is down to 1/2 pill BID.   Review of Systems  Constitutional: Negative for activity change, appetite change and unexpected weight change.  Respiratory: Negative for chest tightness and shortness of breath.   Cardiovascular: Negative for chest pain and leg swelling.  Neurological: Positive for dizziness. Negative for headaches.  Psychiatric/Behavioral: The patient is not nervous/anxious.     Allergies Patient is allergic to peanut-containing drug products; benadryl [diphenhydramine hcl (sleep)]; latex; penicillins; and shrimp [shellfish allergy].  Past Medical History Patient  has a past medical history of Asthma, History of chicken pox, Hypertension, Migraines, Pancreatitis (01/2018), and Seizures (HCC).  Surgical History Patient  has a past surgical history that includes Wisdom tooth extraction.  Family History Pateint's family history includes Alcohol abuse in her father; Arthritis in her father; Asthma in her mother; Breast cancer in her mother; Diabetes in her father; Drug abuse in her mother; Hypertension in her mother.  Social History Patient  reports that she quit smoking about 4 weeks ago. Her smoking use included cigarettes. She has never used smokeless tobacco. She reports that she drank alcohol. She reports that she does not use drugs.    Objective: Vitals:   02/18/18 1111  BP: 94/62  Pulse: 84  Resp: 16  Temp: 98.4 F (36.9 C)  TempSrc: Oral  Weight: 103 lb 3.2 oz (46.8 kg)    Body mass index is 18.88 kg/m.  Physical Exam   Constitutional: She appears well-developed and well-nourished.  Tobacco odor.   Cardiovascular: Normal rate, regular rhythm and normal heart sounds.  Pulmonary/Chest: Effort normal and breath sounds normal.  Abdominal: Soft. Bowel sounds are normal.  Psychiatric: She has a normal mood and affect. Her behavior is normal.  Vitals reviewed.      Assessment/plan: 1. Seizure disorder (HCC) FMLA and short term disability paperwork filled out. Faxed to appropriate numbers. Return to work on 03/25/18 or sooner if doing well when I see her in July.   2. Essential hypertension Still quite hypotensive. Continue to wean off labetalol. Heart rate doing okay. See her back in a couple weeks for bp recheck off all medication to make sure nothing needs to be added and to monitor her heart rate.   More than 15 minutes of appointment spent in counseling/filling out paperwork/coordinating care.    Return for keep scheduled appointment .   Orland MustardAllison Eyanna Mcgonagle, MD [place] [facility]   02/18/2018

## 2018-02-18 ENCOUNTER — Ambulatory Visit (INDEPENDENT_AMBULATORY_CARE_PROVIDER_SITE_OTHER): Payer: Managed Care, Other (non HMO) | Admitting: Family Medicine

## 2018-02-18 ENCOUNTER — Ambulatory Visit: Payer: Managed Care, Other (non HMO) | Admitting: Family Medicine

## 2018-02-18 ENCOUNTER — Encounter: Payer: Self-pay | Admitting: Family Medicine

## 2018-02-18 ENCOUNTER — Telehealth: Payer: Self-pay

## 2018-02-18 VITALS — BP 94/62 | HR 84 | Temp 98.4°F | Resp 16 | Wt 103.2 lb

## 2018-02-18 DIAGNOSIS — I10 Essential (primary) hypertension: Secondary | ICD-10-CM

## 2018-02-18 DIAGNOSIS — G40909 Epilepsy, unspecified, not intractable, without status epilepticus: Secondary | ICD-10-CM | POA: Diagnosis not present

## 2018-02-18 MED ORDER — AMLODIPINE BESYLATE 5 MG PO TABS: 5.0000 mg | ORAL_TABLET | Freq: Every day | ORAL | 3 refills | Status: DC

## 2018-02-18 NOTE — Telephone Encounter (Signed)
Spoke to [facility] [phone].  Cancelled rx for Amlodipine sent over electronically this morning.  Pharmacist verbalized understanding and rx was cancelled

## 2018-02-19 ENCOUNTER — Telehealth: Payer: Self-pay | Admitting: Family Medicine

## 2018-02-19 NOTE — Telephone Encounter (Signed)
See note.   Copied from CRM [phone]. Topic: Medical Record Request - Payor/Billing Request >> Feb 19, 2018  1:08 PM Alexander BergeronBarksdale, Harvey B wrote: Reason for CRM: [name] called to ask if the facility has received the medical records request for the pt, if you have or havent contact [phone] reference # [id number] to confirm or not confirm that records are received

## 2018-02-22 ENCOUNTER — Ambulatory Visit: Payer: Managed Care, Other (non HMO) | Admitting: Family Medicine

## 2018-02-22 ENCOUNTER — Telehealth: Payer: Self-pay | Admitting: Neurology

## 2018-02-22 NOTE — Telephone Encounter (Signed)
Pt requesting a call to discuss levETIRAcetam (KEPPRA) 500 MG tablet stating she is needing to know how long in between each tablet will she have to wait. Please call to advise

## 2018-02-22 NOTE — Telephone Encounter (Signed)
Spoke to [name], [name].  Confirmed receipt of medical records for patient Donna SpurlingShamika Revelle, DOB: 01/06/1991.  Information filled out and being faxed back to their office today.

## 2018-02-22 NOTE — Telephone Encounter (Signed)
Spoke to patient - instructed her to take her Keppra doses every 12 hours.  She verbalized understanding.

## 2018-03-01 ENCOUNTER — Telehealth: Payer: Self-pay

## 2018-03-01 NOTE — Telephone Encounter (Signed)
Spoke to WhitewrightAngelo at [employer] in regards to patient's requested records.  Advised him to contact [facility] at [phone] to request records.

## 2018-03-03 NOTE — Progress Notes (Addendum)
  Received a third request for medical records after I have specifically explained twice to two different reps (see notes below) that records requests need to be submitted directly to [facility] by calling [phone].  Spoke with [employer] HealthSaikat today.  He stated he would email the appropriate person and have them submit the request to [facility] through [facility].      Spoke to patient's insurance co-[employer].  *Second phone call since 7/8*  Spoke to Vinod.  Received second request for medical records for patient.  Vinod confirms receipt of information that I provided on 7/8 phone call for medical records phone number for [facility].  Initially, I advised the initial rep that I spoke with to contact [facility] Dept directly to request records.  They are requesting records from pt being in hospital also.  Vinod verbalized understanding of all info and said I could discard both sets of records request.  He also confirmed they ([employer]) did reach out to [facility] today and left voicemail message to requesting records for this patient.

## 2018-03-15 NOTE — Progress Notes (Signed)
Patient: Donna Short MRN: [id number] DOB: [birth year] PCP: Orland MustardWolfe, Latanga Nedrow, MD     Subjective:  Chief Complaint  Patient presents with  . Follow-up    blood pressure    HPI: The patient is a 27 y.o. female who presents today for follow up blood pressure and continued short term disability paperwork to be filled out.   Hypertension: Here for follow up of hypertension.  Currently on nothing. We weaned her off of the labetalol and she has been doing great.  Weight has been stable. Denies any chest pain, headaches, shortness of breath, vision changes, swelling in lower extremities. Denies any fast heart rate as they also put her on this for tachycardia.   STD: would like to go back to work next week. Is needing a letter releasing her to work for her boss and also for the STD to cover until next week. Paperwork brought with her.    Review of Systems  Constitutional: Negative for activity change and fatigue.  Respiratory: Negative for shortness of breath.   Cardiovascular: Negative for chest pain and leg swelling.  Gastrointestinal: Negative for abdominal pain and nausea.  Neurological: Negative for dizziness and headaches.  Psychiatric/Behavioral: The patient is not nervous/anxious.     Allergies Patient is allergic to peanut-containing drug products; benadryl [diphenhydramine hcl (sleep)]; latex; penicillins; and shrimp [shellfish allergy].  Past Medical History Patient  has a past medical history of Asthma, History of chicken pox, Hypertension, Migraines, Pancreatitis (01/2018), and Seizures (HCC).  Surgical History Patient  has a past surgical history that includes Wisdom tooth extraction.  Family History Pateint's family history includes Alcohol abuse in her father; Arthritis in her father; Asthma in her mother; Breast cancer in her mother; Diabetes in her father; Drug abuse in her mother; Hypertension in her mother.  Social History Patient  reports that she quit smoking  about 7 weeks ago. Her smoking use included cigarettes. She has never used smokeless tobacco. She reports that she drank alcohol. She reports that she does not use drugs.    Objective: Vitals:   03/17/18 1122 03/17/18 1152  BP: 120/82 110/84  Pulse: 70   Resp: 16   Temp: 98.3 F (36.8 C)   TempSrc: Oral   Weight: 106 lb 6.4 oz (48.3 kg)   Height: 5\' 2"  (1.575 m)     Body mass index is 19.46 kg/m.  Physical Exam  Constitutional: She is oriented to person, place, and time. She appears well-developed and well-nourished.  Cardiovascular: Normal rate, regular rhythm and normal heart sounds.  Pulmonary/Chest: Effort normal and breath sounds normal.  Abdominal: Soft. Bowel sounds are normal.  Neurological: She is oriented to person, place, and time. No cranial nerve deficit.  Psychiatric: She has a normal mood and affect. Her behavior is normal.  Vitals reviewed.      Assessment/plan: 1. Essential hypertension Very well controlled off medication. Do not feel like she needs this and her heart rate is well controlled. Will continue to monitor off medication.   She is cleared to go back to work next week. She has a lunch break and 2 other breaks. Will have her start back a week from today (03/24/2018) to full time hours. Note given to clear her for work and will fax to her STD. Let me know if any issues or feels like she is not ready for 8 hour work days.       Return if symptoms worsen or fail to improve.     Revonda StandardAllison  Artis Flock, MD [facility] [facility] [facility]   03/17/2018

## 2018-03-17 ENCOUNTER — Telehealth: Payer: Self-pay | Admitting: Family Medicine

## 2018-03-17 ENCOUNTER — Ambulatory Visit: Payer: Managed Care, Other (non HMO) | Admitting: Family Medicine

## 2018-03-17 ENCOUNTER — Encounter: Payer: Self-pay | Admitting: Family Medicine

## 2018-03-17 VITALS — BP 110/84 | HR 70 | Temp 98.3°F | Resp 16 | Ht 62.0 in | Wt 106.4 lb

## 2018-03-17 DIAGNOSIS — I10 Essential (primary) hypertension: Secondary | ICD-10-CM | POA: Diagnosis not present

## 2018-03-17 NOTE — Telephone Encounter (Signed)
Copied from CRM [id number]. Topic: General - Other >> Mar 17, 2018  1:56 PM Luanna Coleawoud, Jessica L wrote: Reason for CRM: emily calling from [employer] called and stated that she needed additional information about pt short term disability claim. Please advise

## 2018-03-17 NOTE — Telephone Encounter (Signed)
Called and left voicemail message for Irving Burtonmily from [employer] to return my call regarding short term disability claim for patient.

## 2018-03-18 ENCOUNTER — Telehealth: Payer: Self-pay | Admitting: Family Medicine

## 2018-03-18 NOTE — Telephone Encounter (Signed)
Called and spoke to patient.  Per patient, her HR dept is only counting the paperwork from PCP for her FMLA short term disability claim.  Paperwork from neurologist is not being counted toward her FMLA disability claim.

## 2018-03-18 NOTE — Telephone Encounter (Signed)
Spoke to [place] ViewEmily from [employer] and reviewed information from patient's office visit yesterday 7/24.  Irving Burtonmily notified patient cleared to return to work at her job on 7/31 for full time hours.

## 2018-03-18 NOTE — Telephone Encounter (Signed)
Let her know that her neurologist has filled out the same disability paperwork as I have. She only needs one doctor to do this. They also put that she could return to work on 03/05/2018. She didn't tell me any of this and she doesn't need 2 physicians filling this out.

## 2018-03-18 NOTE — Telephone Encounter (Signed)
Called and left voicemail message for Donna Short from patient's insurance co-[employer] to return my call.

## 2018-03-22 NOTE — Telephone Encounter (Signed)
jen-did I need to do the paperwork or are we okay?

## 2018-03-22 NOTE — Telephone Encounter (Signed)
Dr. Soundra PilonWolfe-We are good! You already completed her paperwork.  I just had to give verbal info to her insurance co about her return to work date, Catering manageretc.

## 2018-03-24 ENCOUNTER — Telehealth: Payer: Self-pay | Admitting: Family Medicine

## 2018-03-24 NOTE — Telephone Encounter (Signed)
Copied from CRM [id number]. Topic: General - Other >> Mar 24, 2018  3:47 PM Stephannie LiSimmons, Kathaleya Mcduffee L, NT wrote: Reason for CRM: Patient called today to follow up and patient is requesting that  the consent form is [facility] she would like a return call , and she will return back to work tomorrow .

## 2018-03-24 NOTE — Telephone Encounter (Signed)
See note

## 2018-03-24 NOTE — Telephone Encounter (Signed)
Called and spoke to [place]ily from [employer].  I gave her patient's return to work date per Dr. Blair HeysWolfe's last note from visit on 03/17/18.

## 2018-03-24 NOTE — Telephone Encounter (Signed)
Attempted to contact Irving Burtonmily from [employer] to clarify what information that she needs.  Left voicemail msg for her to return my call

## 2018-03-24 NOTE — Telephone Encounter (Signed)
Caller name: Nolberto HanlonMarisa  Relation to pt:  [employer] from Short Term Disability Call back number: [phone] option 1 case manager Irving Burtonmily ext [phone]    Reason for call:  [name] and the patient called on 3 way stating [name] was informed a medical release form was required before releasing patient records. Please mail release form to patient and when form is received back please fax to  # [phone] Attention: [employer] Case Manager Irving Burtonmily / Case # [id number]

## 2018-03-24 NOTE — Telephone Encounter (Signed)
Returning call.

## 2018-03-25 NOTE — Telephone Encounter (Signed)
Attempted to contact patient on cell phone # and left voicemail msg for pt to return my call

## 2018-03-29 NOTE — Telephone Encounter (Signed)
Called pt's cell phone # and left voicemail message for her to return my phone call.

## 2018-03-29 NOTE — Telephone Encounter (Signed)
Spoke to patient.  She was just confirming that we had faxed paperwork to her insurance co that she was able to return to work.  Advised her that this had been taken care of.  Pt verbalized understanding.

## 2018-04-13 ENCOUNTER — Institutional Professional Consult (permissible substitution): Payer: Managed Care, Other (non HMO) | Admitting: Neurology

## 2018-05-07 ENCOUNTER — Telehealth: Payer: Self-pay | Admitting: Family Medicine

## 2018-05-07 NOTE — Telephone Encounter (Signed)
See note

## 2018-05-07 NOTE — Progress Notes (Signed)
Current medication list faxed to [employer] case manager, Gerrit HeckMercedes Moon per her request.

## 2018-05-07 NOTE — Telephone Encounter (Signed)
Copied from CRM [id number]. Topic: Quick Communication - See Telephone Encounter >> May 07, 2018  3:41 PM Jens SomMedley, Jennifer A wrote: CRM for notification. See Telephone encounter for: 05/07/18. Patient is having difficulties at work and is requesting Dr. Artis FlockWolfe to advise her.  She does not want to her another seizure.  Please adivse Patients call back number [phone]

## 2018-05-07 NOTE — Telephone Encounter (Signed)
Called and spoke with patient.  She states that since she has been back to work, she has been feeling very overwhelmed, and overworked.  Also, [facility]    Do you need to see her again?  Please advise.

## 2018-05-10 NOTE — Telephone Encounter (Signed)
I can see her, but I can not help her with the job. She needs to talk with her boss and see about adjusting schedule and what they can do to help her. If she feels like she is having anxiety, then she can def. Come in for an appointment.

## 2018-05-10 NOTE — Telephone Encounter (Signed)
Called and spoke with patient.  Advised her that she should sit down with her boss and discuss adjusting her schedule/hours and poss modify work duties/responsibilities.  Advised that Dr. Artis FlockWolfe is happy to see her for appt is she is having anxiety otherwise suggests talking with boss to adjust hours at her job.  She verbalized understanding.

## 2018-05-20 ENCOUNTER — Encounter: Payer: Self-pay | Admitting: Family Medicine

## 2018-05-20 ENCOUNTER — Other Ambulatory Visit: Payer: Self-pay | Admitting: Family Medicine

## 2018-05-20 DIAGNOSIS — IMO0001 Reserved for inherently not codable concepts without codable children: Secondary | ICD-10-CM | POA: Insufficient documentation

## 2018-07-13 ENCOUNTER — Telehealth: Payer: Self-pay | Admitting: Family Medicine

## 2018-07-13 NOTE — Telephone Encounter (Signed)
Copied from CRM [id number]. Topic: General - Inquiry >> Jul 13, 2018  8:27 AM [name], [name] wrote: Reason for CRM: Patient is calling to request a call back [facility]  or her nurse in regards to having her Depo shot (birth control) completed her at the office. She stated she had some other question and concerned. She is requesting a call back.

## 2018-07-14 NOTE — Telephone Encounter (Signed)
Called and spoke with patient.  She is scheduled elsewhere to get her next depo provera injection but would like to switch to getting it here in March.  I told her that she will need a negative urine preg test before being able to receive injection.  She has already had her pap smear earlier this year which was normal.  She verbalized understanding.  Pt also states that she did have her previous records sent here

## 2018-09-13 ENCOUNTER — Ambulatory Visit: Payer: Managed Care, Other (non HMO) | Admitting: Adult Health

## 2018-11-05 NOTE — Progress Notes (Deleted)
PATIENT: Donna Short DOB: 09-09-1990  REASON FOR VISIT: follow up HISTORY FROM: patient  HISTORY OF PRESENT ILLNESS: Today 11/05/18  HISTORY  Donna Primas Johnsonis a 28 year old right-handed female, accompanied by her godfather Mr. Elige Radon, seen in refer by emergency room for evaluation of her only seizure in December 19 2015.  In April 14th 2017,She had acute onset generalized tonic-clonic seizure at work in [employer], she woke up in the ambulance, 3 hours later, while at emergency room, she had her second generalized tonic-clonic seizure, she has been treated with Keppra 500 mg twice a day, tolerating medication well.  I have personally reviewed CAT scan of the brain in April 2017, no intra-cranial abnormality, right parietal skull abrasion,  Laboratory evaluation showed mild anemia, with hemoglobin 11.8, normal CMP, negative pregnancy test.  She denies focal symptoms, she was developmentally normal, denier history of febrile seizure, there was no family history of epilepsy.  She works at [employer] for [employer]  UPDATE May 30th 2017: EEG was normal in May 27th 2017. She still works at [employer], She could not afford MRI brain. She spent most of today's visit complaining her work-related stress  she is now taking Keppra 500mg  bid, Noticed irritability, anxiety, mostly work related  UPDATE February 12 2018: She has lost follow-up since last visit in May 2017, she was supposed to take lamotrigine 100 mg twice a day, but not compliant with her medications, she was admitted to the hospital in July 2019 for acute pancreatitis, related to alcohol use, hospital course was also complicated by acute encephalopathy, possible alcohol withdrawal seizure, possible aspiration pneumonia, requiring intubation, during her hospital stay, there was reported seizure activity,  CT head without contrast on Jan 18, 2018 was normal.  Discharge laboratory on January 28, 2018  showed low potassium 3.3, normal creatinine 0.91,WBC remains elevated 12.9, anemia hemoglobin of 7.7, normal TSH, folic acid, B12 396,  CT abdomen on Jan 20, 2018 showed changes of acute pancreatitis, considerable peripancreatic fluid, and the free fluid in the abdomen, wall thickening within the colon, bilateral pleural effusion, lower lobe consolidation,  She now complains of feeling generalized fatigue,  Blood pressure is 94/68, heart rate of 97, she was discharged with prescription of labetalol 200 mg twice a day  Update November 08, 2018 SS: Follow-up for seizure, is taking Keppra 500 mg twice daily.   REVIEW OF SYSTEMS: Out of a complete 14 system review of symptoms, the patient complains only of the following symptoms, and all other reviewed systems are negative.  ALLERGIES: Allergies  Allergen Reactions  . Peanut-Containing Drug Products Other (See Comments)  . Benadryl [Diphenhydramine Hcl (Sleep)] Rash  . Latex Rash  . Penicillins Itching and Rash    Has patient had a PCN reaction causing immediate rash, facial/tongue/throat swelling, SOB or lightheadedness with hypotension: Yes Has patient had a PCN reaction causing severe rash involving mucus membranes or skin necrosis: No Has patient had a PCN reaction that required hospitalization: Yes Has patient had a PCN reaction occurring within the last 10 years: No If all of the above answers are "NO", then may proceed with Cephalosporin use.;  . Shrimp [Shellfish Allergy] Hives and Rash    HOME MEDICATIONS: Outpatient Medications Prior to Visit  Medication Sig Dispense Refill  . feeding supplement, ENSURE ENLIVE, (ENSURE ENLIVE) LIQD Take 237 mLs by mouth 3 (three) times daily between meals. 90 Bottle 0  . folic acid (FOLVITE) 1 MG tablet Take 1 tablet (1  mg total) by mouth daily. 90 tablet 3  . levETIRAcetam (KEPPRA) 500 MG tablet Take 1 tablet (500 mg total) by mouth 2 (two) times daily. 180 tablet 4  . medroxyPROGESTERone  (DEPO-PROVERA) 150 MG/ML injection Inject 1 mL (150 mg total) into the muscle every 3 (three) months. 1 mL   . thiamine 100 MG tablet Take 1 tablet (100 mg total) by mouth daily. 30 tablet 0   No facility-administered medications prior to visit.     PAST MEDICAL HISTORY: Past Medical History:  Diagnosis Date  . Asthma   . History of chicken pox   . Hypertension   . Migraines   . Pancreatitis 01/2018  . Seizures (HCC)     PAST SURGICAL HISTORY: Past Surgical History:  Procedure Laterality Date  . WISDOM TOOTH EXTRACTION      FAMILY HISTORY: Family History  Problem Relation Age of Onset  . Diabetes Father   . Arthritis Father   . Alcohol abuse Father   . Breast cancer Mother   . Asthma Mother   . Drug abuse Mother   . Hypertension Mother     SOCIAL HISTORY: Social History   Socioeconomic History  . Marital status: Single    Spouse name: Not on file  . Number of children: 0  . Years of education: 2 years college  . Highest education level: Not on file  Occupational History  . Occupation: Clinical research associate at [employer]  . Financial resource strain: Not on file  . Food insecurity:    Worry: Not on file    Inability: Not on file  . Transportation needs:    Medical: Not on file    Non-medical: Not on file  Tobacco Use  . Smoking status: Former Smoker    Types: Cigarettes    Last attempt to quit: 01/21/2018    Years since quitting: 0.7  . Smokeless tobacco: Never Used  Substance and Sexual Activity  . Alcohol use: Not Currently    Alcohol/week: 0.0 standard drinks    Comment: " quit drinking before my birthday on 01/10/2018 "  . Drug use: No  . Sexual activity: Not Currently  Lifestyle  . Physical activity:    Days per week: Not on file    Minutes per session: Not on file  . Stress: Not on file  Relationships  . Social connections:    Talks on phone: Not on file    Gets together: Not on file    Attends religious service: Not on file    Active  member of club or organization: Not on file    Attends meetings of clubs or organizations: Not on file    Relationship status: Not on file  . Intimate partner violence:    Fear of current or ex partner: Not on file    Emotionally abused: Not on file    Physically abused: Not on file    Forced sexual activity: Not on file  Other Topics Concern  . Not on file  Social History Narrative   Lives at home alone.   Right-handed.   No caffeine use.      PHYSICAL EXAM  There were no vitals filed for this visit. There is no height or weight on file to calculate BMI.  Generalized: Well developed, in no acute distress   Neurological examination  Mentation: Alert oriented to time, place, history taking. Follows all commands speech and language fluent Cranial nerve II-XII: Pupils were equal round reactive to  light. Extraocular movements were full, visual field were full on confrontational test. Facial sensation and strength were normal. Uvula tongue midline. Head turning and shoulder shrug  were normal and symmetric. Motor: The motor testing reveals 5 over 5 strength of all 4 extremities. Good symmetric motor tone is noted throughout.  Sensory: Sensory testing is intact to soft touch on all 4 extremities. No evidence of extinction is noted.  Coordination: Cerebellar testing reveals good finger-nose-finger and heel-to-shin bilaterally.  Gait and station: Gait is normal. Tandem gait is normal. Romberg is negative. No drift is seen.  Reflexes: Deep tendon reflexes are symmetric and normal bilaterally.   DIAGNOSTIC DATA (LABS, IMAGING, TESTING) - I reviewed patient records, labs, notes, testing and imaging myself where available.  Lab Results  Component Value Date   WBC 8.0 02/15/2018   HGB 10.1 (L) 02/15/2018   HCT 29.8 (L) 02/15/2018   MCV 108.8 (H) 02/15/2018   PLT 288.0 02/15/2018      Component Value Date/Time   NA 134 (L) 02/15/2018 1410   K 4.2 02/15/2018 1410   CL 101 02/15/2018  1410   CO2 20 02/15/2018 1410   GLUCOSE 77 02/15/2018 1410   BUN 21 02/15/2018 1410   CREATININE 0.77 02/15/2018 1410   CALCIUM 10.6 (H) 02/15/2018 1410   PROT 8.8 (H) 02/15/2018 1410   ALBUMIN 4.3 02/15/2018 1410   AST 27 02/15/2018 1410   ALT 18 02/15/2018 1410   ALKPHOS 35 (L) 02/15/2018 1410   BILITOT 0.3 02/15/2018 1410   GFRNONAA >60 01/28/2018 0612   GFRAA >60 01/28/2018 0612   Lab Results  Component Value Date   TRIG 133 01/17/2018   No results found for: HGBA1C Lab Results  Component Value Date   VITAMINB12 665 02/15/2018   Lab Results  Component Value Date   TSH 3.189 01/26/2018      ASSESSMENT AND PLAN 28 y.o. year old female  has a past medical history of Asthma, History of chicken pox, Hypertension, Migraines, Pancreatitis (01/2018), and Seizures (HCC). here with ***   I spent 15 minutes with the patient. 50% of this time was spent   Margie Ege, [place], DNP 11/05/2018, 10:57 AM [facility] [address], [address] [place], [place] [address] [phone]

## 2018-11-07 ENCOUNTER — Telehealth: Payer: Self-pay | Admitting: Neurology

## 2018-11-07 NOTE — Telephone Encounter (Signed)
I called the patient and left a message letting her know that our office will need to reschedule her appointment for tomorrow. Our office will call to reschedule her appointment.

## 2018-11-08 ENCOUNTER — Ambulatory Visit: Payer: Managed Care, Other (non HMO) | Admitting: Neurology

## 2018-11-08 ENCOUNTER — Telehealth: Payer: Self-pay | Admitting: Neurology

## 2018-11-08 NOTE — Telephone Encounter (Signed)
I called the patient to see how she was doing since her appointment today was cancelled due to our office closing. She reports no seizure since May 2019. She does report that she stopped taking Keppra, because she misunderstood and thought she couldn't drive while taking keppra. I advised her to keep taking her Keppra. She should not drive within 6 months of a seizure. She will need to come back in for a follow-up appointment in the next few weeks.

## 2018-11-09 ENCOUNTER — Telehealth: Payer: Self-pay | Admitting: Neurology

## 2018-11-09 MED ORDER — LEVETIRACETAM ER 500 MG PO TB24: 1000.0000 mg | ORAL_TABLET | Freq: Every day | ORAL | 5 refills | Status: DC

## 2018-11-09 NOTE — Telephone Encounter (Signed)
Unable to get in contact with the patient to r/s her appt. Office number was provided.  If this patient calls back. Schedule her appt with sarah in 2 weeks

## 2018-11-09 NOTE — Telephone Encounter (Signed)
I called the patient. She has history of seizures.  She reports no seizures since May 2019.  I talked to her yesterday and she had stopped Keppra at that time.  I advised her to restart the Keppra.  Today she is calling saying that she is not able to tolerate the Keppra because she feels drowsy, dizzy, slow.  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  I talked with Dr. Terrace Arabia and she should start taking Keppra extended release 500 mg tablets take 2 tablets daily. A refill was sent in. She has an appointment with me 11/23/2018.

## 2018-11-11 ENCOUNTER — Telehealth: Payer: Self-pay

## 2018-11-11 NOTE — Telephone Encounter (Signed)
Received paperwork from Karin Golden stating that levETIRAcetam (KEPPRA XR) 500 MG 24 hr tablet stating that this medication is currently on back order. I tried to call to the patient to see if she was available to come in to discuss a visit. I left a voicemail asking her to return my call.

## 2018-11-23 ENCOUNTER — Ambulatory Visit: Payer: Managed Care, Other (non HMO) | Admitting: Neurology

## 2018-12-07 ENCOUNTER — Ambulatory Visit: Payer: Managed Care, Other (non HMO) | Admitting: Physician Assistant

## 2018-12-27 ENCOUNTER — Ambulatory Visit (INDEPENDENT_AMBULATORY_CARE_PROVIDER_SITE_OTHER): Payer: Managed Care, Other (non HMO) | Admitting: Family Medicine

## 2018-12-27 ENCOUNTER — Telehealth: Payer: Self-pay | Admitting: Family Medicine

## 2018-12-27 ENCOUNTER — Encounter: Payer: Self-pay | Admitting: Family Medicine

## 2018-12-27 VITALS — Ht 62.0 in | Wt 134.0 lb

## 2018-12-27 DIAGNOSIS — B001 Herpesviral vesicular dermatitis: Secondary | ICD-10-CM | POA: Diagnosis not present

## 2018-12-27 MED ORDER — ACYCLOVIR 400 MG PO TABS: 400.0000 mg | ORAL_TABLET | Freq: Three times a day (TID) | ORAL | 1 refills | Status: DC

## 2018-12-27 NOTE — Telephone Encounter (Signed)
Copied from CRM [id number]. Topic: General - Inquiry >> Dec 27, 2018  8:58 AM Lynne Logan D wrote: Reason for CRM: Pt called and stated she is required to wear a facemask at work. She did not have one of her own so they supplied one which caused her to break out on her lower jaw and develop cold sores. She would like to know what Dr. Artis Flock recommends she do for the cold sores. Please advise. TD#[phone]

## 2018-12-27 NOTE — Telephone Encounter (Signed)
Pt scheduled for 2:20 visit today w/Dr. Artis Flock

## 2018-12-27 NOTE — Progress Notes (Signed)
Patient: Donna CurlsShamika M Short MRN: [id number] DOB: 1991-05-04 PCP: Orland MustardWolfe, Damel Querry, MD     I connected with Donna Short on 12/27/18 at 2:34pm by a video enabled telemedicine application and verified that I am speaking with the correct person using two identifiers.  Location patient: Home Location provider: [facility], Office Persons participating in this virtual visit: Foye SpurlingShamika Hallstrom and Dr Artis FlockWolfe   I discussed the limitations of evaluation and management by telemedicine and the availability of in person appointments. The patient expressed understanding and agreed to proceed.   Subjective:  Chief Complaint  Patient presents with  . Rash    fever blister    HPI: The patient is a 28 y.o. female who presents today for rash around mouth. She works at [employer] and they made her wear a mask daily at her shifts. She started this about 1.5 weeks ago. Rash started started on Tuesday night. She has vescilar lesions on her lower lip and down onto her chin. They are itchy. Nothing on her upper lip. She has had fever blisters in the past about 3 years ago. She has been out of work x 5 days. She has tried claritin and it made her itchy. She has been using heat and carmex on it over the weekend due to a nurses advice. She can not take benadryl due to alelrgy. She also has a rash on the lower part of her chin and neck from wearing a surgical mask provided by her job. She is required to wear a mask at her job. They have put her out of work x 5 days.   Review of Systems  Constitutional: Negative for chills and fever.  HENT: Negative for congestion, facial swelling, sinus pressure, sinus pain and sneezing.   Respiratory: Negative for cough, shortness of breath and wheezing.   Cardiovascular: Negative for chest pain, palpitations and leg swelling.  Gastrointestinal: Negative for abdominal pain.  Skin: Positive for rash.       Rash around mouth, bottom lip.  Very itchy, red and swollen     Allergies Patient is allergic to claritin [loratadine]; peanut-containing drug products; benadryl [diphenhydramine hcl (sleep)]; latex; penicillins; and shrimp [shellfish allergy].  Past Medical History Patient  has a past medical history of Asthma, History of chicken pox, Hypertension, Migraines, Pancreatitis (01/2018), and Seizures (HCC).  Surgical History Patient  has a past surgical history that includes Wisdom tooth extraction.  Family History Pateint's family history includes Alcohol abuse in her father; Arthritis in her father; Asthma in her mother; Breast cancer in her mother; Diabetes in her father; Drug abuse in her mother; Hypertension in her mother.  Social History Patient  reports that she quit smoking about 11 months ago. Her smoking use included cigarettes. She has never used smokeless tobacco. She reports previous alcohol use. She reports that she does not use drugs.    Objective: Vitals:   12/27/18 1405  Weight: 134 lb (60.8 kg)  Height: 5\' 2"  (1.575 m)    Body mass index is 24.51 kg/m.  Physical Exam Vitals signs reviewed.  Constitutional:      Appearance: Normal appearance.  HENT:     Head: Normocephalic and atraumatic.  Pulmonary:     Effort: Pulmonary effort is normal.  Skin:    Comments: Vesicular lesion on her bottom lip that are open and raw. Maculopapular rash on chin  Neurological:     General: No focal deficit present.     Mental Status: She is alert and oriented  to person, place, and time.        Assessment/plan: 1. Fever blister Acyclovir TID x 5 days. Will also give note for work about wearing a cloth mask since allergy/irritaiton with surgical mask. Also can do hydrocortisone cream to irritated skin on chin that is not part of the open herpes lesions. Let me know if not getting better. If any worsening symptoms will have her come into office.    Return if symptoms worsen or fail to improve.     Orland Mustard, MD [facility]  12/27/2018

## 2018-12-27 NOTE — Telephone Encounter (Signed)
Yes

## 2018-12-27 NOTE — Telephone Encounter (Signed)
pls see msg and advise.   Doxy visit?

## 2019-01-04 NOTE — Telephone Encounter (Signed)
I called pt, offered her a VV with Maralyn Sago, NP. She declined, saying that she cannot afford the $100 copay right now and she will call us back in June to discuss scheduling an appt.

## 2019-02-08 ENCOUNTER — Ambulatory Visit (HOSPITAL_COMMUNITY)
Admission: EM | Admit: 2019-02-08 | Discharge: 2019-02-08 | Disposition: A | Discharge status: Home / Self Care | Payer: Managed Care, Other (non HMO) | Attending: Emergency Medicine | Admitting: Emergency Medicine

## 2019-02-08 ENCOUNTER — Encounter: Payer: Self-pay | Admitting: Physician Assistant

## 2019-02-08 ENCOUNTER — Ambulatory Visit: Payer: Self-pay | Admitting: Family Medicine

## 2019-02-08 ENCOUNTER — Ambulatory Visit: Payer: Managed Care, Other (non HMO) | Admitting: Physician Assistant

## 2019-02-08 ENCOUNTER — Telehealth: Payer: Self-pay | Admitting: General Practice

## 2019-02-08 ENCOUNTER — Encounter (HOSPITAL_COMMUNITY): Payer: Self-pay

## 2019-02-08 ENCOUNTER — Other Ambulatory Visit: Payer: Self-pay

## 2019-02-08 DIAGNOSIS — R05 Cough: Secondary | ICD-10-CM | POA: Diagnosis not present

## 2019-02-08 DIAGNOSIS — R059 Cough, unspecified: Secondary | ICD-10-CM

## 2019-02-08 DIAGNOSIS — J4521 Mild intermittent asthma with (acute) exacerbation: Secondary | ICD-10-CM | POA: Diagnosis not present

## 2019-02-08 DIAGNOSIS — R0602 Shortness of breath: Secondary | ICD-10-CM

## 2019-02-08 DIAGNOSIS — Z20822 Contact with and (suspected) exposure to covid-19: Secondary | ICD-10-CM

## 2019-02-08 MED ORDER — PREDNISONE 50 MG PO TABS: 50.0000 mg | ORAL_TABLET | Freq: Every day | ORAL | 0 refills | Status: AC

## 2019-02-08 MED ORDER — BENZONATATE 200 MG PO CAPS: 200.0000 mg | ORAL_CAPSULE | Freq: Three times a day (TID) | ORAL | 0 refills | Status: AC | PRN

## 2019-02-08 MED ORDER — ALBUTEROL SULFATE HFA 108 (90 BASE) MCG/ACT IN AERS: 1.0000 | INHALATION_SPRAY | Freq: Four times a day (QID) | RESPIRATORY_TRACT | 0 refills | Status: DC | PRN

## 2019-02-08 NOTE — Addendum Note (Signed)
Addended by: Dimple Nanas on: 02/08/2019 03:29 PM   Modules accepted: Orders

## 2019-02-08 NOTE — Telephone Encounter (Signed)
Pt called in c/o having an asthma attack this morning at work due to stress.   She is out of her albuterol inhaler and requesting to have it refilled so she can return to work.   It's been 10 years since I've had an asthma flare up.  I warm transferred her call to Dr. Shelby Mattocks office to [facility] who is going to schedule her a virtual visit.  I sent my notes to the office.  Reason for Disposition . Asthma medicine (nebulizer or inhaler) is needed more frequently than q 4 hours to keep you comfortable    I'm out of my albuterol inhaler.  Answer Assessment - Initial Assessment Questions 1. RESPIRATORY STATUS: "Describe your breathing?" (e.g., wheezing, shortness of breath, unable to speak, severe coughing)      I'm out of albuterol inhalers.      I'm drinking water to help with the shortness of breath.   I was wheezing this morning while at work.   Someone had to come take me home. 2. ONSET: "When did this asthma attack begin?"      Started this morning 3. TRIGGER: "What do you think triggered this attack?" (e.g., URI, exposure to pollen or other allergen, tobacco smoke)      Stress, 4. PEAK EXPIRATORY FLOW RATE (PEFR): "Do you use a peak flow meter?" If so, ask: "What's the current peak flow? What's your personal best peak flow?"      No 5. SEVERITY: "How bad is this attack?"    - MILD: No SOB at rest, mild SOB with walking, speaks normally in sentences, can lay down, no retractions, pulse < 100. (GREEN Zone: PEFR 80-100%)   - MODERATE: SOB at rest, SOB with minimal exertion and prefers to sit, cannot lie down flat, speaks in phrases, mild retractions, audible wheezing, pulse 100-120. (YELLOW Zone: PEFR 50-80%)    - SEVERE: Very SOB at rest, speaks in single words, struggling to breathe, sitting hunched forward, retractions, usually loud wheezing, sometimes minimal wheezing because of decreased air movement, pulse > 120. (RED Zone: PEFR < 50%).      Once I got to work I was short of breath. 6.  MEDICATIONS (Inhaler or nebs): "What are your asthma medications?" and "What treatments have you given so far?"    - Quick-relief: albuterol, metaproterenol, salbutamol, or other inhaled or nebulized beta-agonist medicines   - Long-term-control: steroids, cromolyn, or other anti-inflammatory medicines.     I'm out of my albuterol inhaler. 7. OTHER SYMPTOMS: "Do you have any other symptoms? (e.g., runny nose, chest pain, fever)     No 8. PREGNANCY: "Is there any chance you are pregnant?" "When was your last menstrual period?"     Not asked  Protocols used: ASTHMA ATTACK-A-AH

## 2019-02-08 NOTE — ED Triage Notes (Signed)
Pt states her asthma is flaring up. Pt was sent home from work. Pt needs a work note. Pt states she needs med refill on her albuterol.

## 2019-02-08 NOTE — Telephone Encounter (Signed)
Pt has been scheduled for Covid testing. Scheduled with pt directly.   Pt is scheduled at the [facility] location in [place].  Pt was referred by: [place], [place], PA-C

## 2019-02-08 NOTE — Progress Notes (Signed)
Pt woke up this morning with cough expectorating white/yellow sputum, took a shower. Went to [employer] and started wheezing and SOB. Hx of asthma but has not had an attack for 10 years. Pt is out of her albuterol inhaler and wants a refill. Denies fever, headaches, dizziness.  Told pt after discussing her symptoms with Aldona Bar and she and Dr. Juleen China reviewed your chart and symptoms and recommend you to to the ED or Urgent care to be evaluated. Pt said she just wants a refill of her inhaler. Explained to pt due to sudden onset of cough and SOB and her history of asthma that she could have COVID-19 and needs to be evaluated as soon as possible. Pt said she has no way to get there her car is still at work. Told pt I can call 911 for her or she can. Pt said no, she will call her boss and see if he will come get her and take her. Told pt okay, please make sure you go to be treated. Pt verbalized understanding.   I agree with the above information. I did not see or speak with patient. No charge.   Inda Coke PA-C

## 2019-02-08 NOTE — ED Provider Notes (Signed)
[facility]    CSN: [id number] Arrival date & time: 02/08/19  1339      History   Chief Complaint Chief Complaint  Patient presents with  . Asthma    HPI Donna Short is a 28 y.o. female history of tobacco use, alcohol use, hypertension, asthma, presenting today for evaluation of asthma exacerbation.  Patient states that beginning this morning she had some wheezing and shortness of breath.  She has had a mild productive cough associated with this.  Denies any fevers chills or body aches.  Denies any close sick contacts or any known exposure to COVID.  Denies congestion or sore throat.  She does not take anything for her symptoms.  She has relatively uncomplicated asthma and has not had an asthma flare for the past 10 years.  She no longer has an albuterol inhaler because of this.  HPI  Past Medical History:  Diagnosis Date  . Asthma   . History of chicken pox   . Hypertension   . Migraines   . Pancreatitis 01/2018  . Seizures [facility](HCC)     Patient Active Problem List   Diagnosis Date Noted  . Birth control 05/20/2018  . History of pancreatitis 02/11/2018  . Uncomplicated asthma   . Benign essential HTN   . ETOH abuse   . Tobacco abuse   . Acute blood loss anemia   . Alcohol withdrawal seizure, uncomplicated (HCC)   . Hypotension due to hypovolemia   . Seizures (HCC) 12/19/2015    Past Surgical History:  Procedure Laterality Date  . WISDOM TOOTH EXTRACTION      OB History   No obstetric history on file.      Home Medications    Prior to Admission medications   Medication Sig Start Date End Date Taking? Authorizing Provider  albuterol (VENTOLIN HFA) 108 (90 Base) MCG/ACT inhaler Inhale 1-2 puffs into the lungs every 6 (six) hours as needed for wheezing or shortness of breath. 02/08/19   ,  C, PA-C  Aspirin-Acetaminophen-Caffeine (EXCEDRIN PO) Take 2 tablets by mouth as needed.    [provider]  benzonatate (TESSALON) 200  MG capsule Take 1 capsule (200 mg total) by mouth 3 (three) times daily as needed for up to 7 days for cough. 02/08/19 02/15/19  ,  C, PA-C  medroxyPROGESTERone (DEPO-PROVERA) 150 MG/ML injection Inject 1 mL (150 mg total) into the muscle every 3 (three) months. 05/20/18   Orland MustardWolfe, Allison, MD  predniSONE (DELTASONE) 50 MG tablet Take 1 tablet (50 mg total) by mouth daily for 5 days. 02/08/19 02/13/19  , Junius Creamer C, PA-C    Family History Family History  Problem Relation Age of Onset  . Diabetes Father   . Arthritis Father   . Alcohol abuse Father   . Breast cancer Mother   . Asthma Mother   . Drug abuse Mother   . Hypertension Mother     Social History Social History   Tobacco Use  . Smoking status: Former Smoker    Types: Cigarettes    Quit date: 01/21/2018    Years since quitting: 1.0  . Smokeless tobacco: Never Used  Substance Use Topics  . Alcohol use: Not Currently    Alcohol/week: 0.0 standard drinks    Comment: " quit drinking before my birthday on 01/10/2020 "  . Drug use: No     Allergies   Claritin [loratadine], Peanut-containing drug products, Benadryl [diphenhydramine hcl (sleep)], Latex, Penicillins, and Shrimp [shellfish allergy]  Review of Systems Review of Systems  Constitutional: Negative for activity change, appetite change, chills, fatigue and fever.  HENT: Negative for congestion, ear pain, rhinorrhea, sinus pressure, sore throat and trouble swallowing.   Eyes: Negative for discharge and redness.  Respiratory: Positive for cough, shortness of breath and wheezing. Negative for chest tightness.   Cardiovascular: Negative for chest pain.  Gastrointestinal: Negative for abdominal pain, diarrhea, nausea and vomiting.  Musculoskeletal: Negative for myalgias.  Skin: Negative for rash.  Neurological: Negative for dizziness, light-headedness and headaches.     Physical Exam Triage Vital Signs ED Triage Vitals  Enc Vitals Group     BP  02/08/19 1426 (!) 156/115     Pulse Rate 02/08/19 1426 100     Resp 02/08/19 1426 16     Temp 02/08/19 1426 98.5 F (36.9 C)     Temp Source 02/08/19 1426 Oral     SpO2 02/08/19 1426 100 %     Weight 02/08/19 1429 134 lb 8 oz (61 kg)     Height --      Head Circumference --      Peak Flow --      Pain Score 02/08/19 1429 0     Pain Loc --      Pain Edu? --      Excl. in [place]? --    No data found.  Updated Vital Signs BP (!) 156/115 (BP Location: Right Arm)   Pulse 100   Temp 98.5 F (36.9 C) (Oral)   Resp 16   Wt 134 lb 8 oz (61 kg)   SpO2 100%   BMI 24.60 kg/m   Visual Acuity Right Eye Distance:   Left Eye Distance:   Bilateral Distance:    Right Eye Near:   Left Eye Near:    Bilateral Near:     Physical Exam Vitals signs and nursing note reviewed.  Constitutional:      General: She is not in acute distress.    Appearance: She is well-developed.  HENT:     Head: Normocephalic and atraumatic.     Ears:     Comments: Bilateral ears without tenderness to palpation of external auricle, tragus and mastoid, EAC's without erythema or swelling, TM's with good bony landmarks and cone of light. Non erythematous.     Mouth/Throat:     Comments: Oral mucosa pink and moist, no tonsillar enlargement or exudate. Posterior pharynx patent and nonerythematous, no uvula deviation or swelling. Normal phonation.  Eyes:     Conjunctiva/sclera: Conjunctivae normal.  Neck:     Musculoskeletal: Neck supple.  Cardiovascular:     Rate and Rhythm: Normal rate and regular rhythm.     Heart sounds: No murmur.  Pulmonary:     Effort: Pulmonary effort is normal. No respiratory distress.     Breath sounds: Normal breath sounds.     Comments: Breathing comfortably at rest, CTABL, no wheezing, rales or other adventitious sounds auscultated Abdominal:     Palpations: Abdomen is soft.     Tenderness: There is no abdominal tenderness.  Skin:    General: Skin is warm and dry.   Neurological:     Mental Status: She is alert.      UC Treatments / Results  Labs (all labs ordered are listed, but only abnormal results are displayed) Labs Reviewed - No data to display  EKG None  Radiology No results found.  Procedures Procedures (including critical care time)  Medications Ordered in UC Medications -  No data to display  Initial Impression / Assessment and Plan / UC Course  I have reviewed the triage vital signs and the nursing notes.  Pertinent labs & imaging results that were available during my care of the patient were reviewed by me and considered in my medical decision making (see chart for details).     Lungs without wheezing during visit.  Will refill albuterol inhaler to use as needed for shortness of breath and wheezing, provided prescription for prednisone course, but advised to try inhaler alone may fill prescription for prednisone if shortness of breath and wheezing not controlled with albuterol alone.  Has allergies to Benadryl and Claritin, will hold off on further antihistamines for congestion.  Will recommend Mucinex and/or Flonase.  Tessalon as needed for cough every 8 hours.  We will set up COVID testing prior to returning to work since early in course of symptoms.  Rest and drink plenty of fluids.  Asthma exacerbation versus viral URI/COVID. Final Clinical Impressions(s) / UC Diagnoses   Final diagnoses:  Cough  Mild intermittent asthma with exacerbation     Discharge Instructions     Someone wil contact you to set up COVID testing [phone]  Mucinex for congestion Albuterol inhaler every 4-6 hours as needed for shortness of breath, wheezing Prednisone if wheezing persisting- take in the morning with food Tessalon as needed for cough  Follow up if symptoms not improving or worsening   ED Prescriptions    Medication Sig Dispense Auth. Provider   albuterol (VENTOLIN HFA) 108 (90 Base) MCG/ACT inhaler Inhale 1-2  puffs into the lungs every 6 (six) hours as needed for wheezing or shortness of breath. 1 Inhaler ,  C, PA-C   predniSONE (DELTASONE) 50 MG tablet Take 1 tablet (50 mg total) by mouth daily for 5 days. 5 tablet ,  C, PA-C   benzonatate (TESSALON) 200 MG capsule Take 1 capsule (200 mg total) by mouth 3 (three) times daily as needed for up to 7 days for cough. 28 capsule ,  C, PA-C     Controlled Substance Prescriptions [place] Controlled Substance Registry consulted? Not Applicable   Lew Dawes,  C, [place]PA-C 02/08/19 1749

## 2019-02-08 NOTE — Telephone Encounter (Signed)
-----   Message from Janith Lima, PA-C sent at 02/08/2019  2:58 PM EDT ----- Regarding: Needs COVID testing Cough, shortness of breath x 1 day, history of asthma

## 2019-02-08 NOTE — Discharge Instructions (Addendum)
Someone wil contact you to set up COVID testing [place]  Mucinex for congestion Albuterol inhaler every 4-6 hours as needed for shortness of breath, wheezing Prednisone if wheezing persisting- take in the morning with food Tessalon as needed for cough  Follow up if symptoms not improving or worsening

## 2019-02-09 ENCOUNTER — Other Ambulatory Visit: Payer: Managed Care, Other (non HMO)

## 2019-02-09 DIAGNOSIS — Z20822 Contact with and (suspected) exposure to covid-19: Secondary | ICD-10-CM

## 2019-02-12 LAB — NOVEL CORONAVIRUS, NAA: SARS-CoV-2, NAA: NOT DETECTED

## 2019-02-14 ENCOUNTER — Telehealth (HOSPITAL_COMMUNITY): Payer: Self-pay | Admitting: Emergency Medicine

## 2019-02-14 NOTE — Telephone Encounter (Signed)
Patient called requesting note for work to be faxed to her Job at [employer], fax number [phone], with note stating COVID test was negative. Will fax note.

## 2019-02-24 ENCOUNTER — Ambulatory Visit (INDEPENDENT_AMBULATORY_CARE_PROVIDER_SITE_OTHER)
Admission: EM | Admit: 2019-02-24 | Discharge: 2019-02-24 | Disposition: A | Discharge status: Home / Self Care | Payer: Managed Care, Other (non HMO) | Source: Home / Self Care | Attending: Family Medicine | Admitting: Family Medicine

## 2019-02-24 ENCOUNTER — Other Ambulatory Visit: Payer: Self-pay

## 2019-02-24 ENCOUNTER — Encounter (HOSPITAL_COMMUNITY): Payer: Self-pay

## 2019-02-24 ENCOUNTER — Emergency Department (HOSPITAL_COMMUNITY): Payer: Managed Care, Other (non HMO)

## 2019-02-24 ENCOUNTER — Encounter (HOSPITAL_COMMUNITY): Payer: Self-pay | Admitting: Emergency Medicine

## 2019-02-24 ENCOUNTER — Inpatient Hospital Stay (HOSPITAL_COMMUNITY)
Admission: EM | Admit: 2019-02-24 | Discharge: 2019-02-27 | DRG: 439 | Disposition: A | Discharge status: Home / Self Care | Payer: Managed Care, Other (non HMO) | Attending: Family Medicine | Admitting: Family Medicine

## 2019-02-24 DIAGNOSIS — R5383 Other fatigue: Secondary | ICD-10-CM | POA: Diagnosis not present

## 2019-02-24 DIAGNOSIS — K861 Other chronic pancreatitis: Secondary | ICD-10-CM | POA: Diagnosis not present

## 2019-02-24 DIAGNOSIS — F1020 Alcohol dependence, uncomplicated: Secondary | ICD-10-CM | POA: Diagnosis present

## 2019-02-24 DIAGNOSIS — R112 Nausea with vomiting, unspecified: Secondary | ICD-10-CM

## 2019-02-24 DIAGNOSIS — Z825 Family history of asthma and other chronic lower respiratory diseases: Secondary | ICD-10-CM

## 2019-02-24 DIAGNOSIS — K860 Alcohol-induced chronic pancreatitis: Secondary | ICD-10-CM | POA: Diagnosis present

## 2019-02-24 DIAGNOSIS — K852 Alcohol induced acute pancreatitis without necrosis or infection: Secondary | ICD-10-CM | POA: Diagnosis not present

## 2019-02-24 DIAGNOSIS — Z1159 Encounter for screening for other viral diseases: Secondary | ICD-10-CM

## 2019-02-24 DIAGNOSIS — E860 Dehydration: Secondary | ICD-10-CM

## 2019-02-24 DIAGNOSIS — Z720 Tobacco use: Secondary | ICD-10-CM | POA: Diagnosis present

## 2019-02-24 DIAGNOSIS — R6510 Systemic inflammatory response syndrome (SIRS) of non-infectious origin without acute organ dysfunction: Secondary | ICD-10-CM | POA: Diagnosis present

## 2019-02-24 DIAGNOSIS — Z811 Family history of alcohol abuse and dependence: Secondary | ICD-10-CM

## 2019-02-24 DIAGNOSIS — R1010 Upper abdominal pain, unspecified: Secondary | ICD-10-CM

## 2019-02-24 DIAGNOSIS — F10930 Alcohol use, unspecified with withdrawal, uncomplicated: Secondary | ICD-10-CM | POA: Diagnosis present

## 2019-02-24 DIAGNOSIS — Z91040 Latex allergy status: Secondary | ICD-10-CM

## 2019-02-24 DIAGNOSIS — Z880 Allergy status to penicillin: Secondary | ICD-10-CM

## 2019-02-24 DIAGNOSIS — K8681 Exocrine pancreatic insufficiency: Secondary | ICD-10-CM | POA: Diagnosis present

## 2019-02-24 DIAGNOSIS — J45909 Unspecified asthma, uncomplicated: Secondary | ICD-10-CM | POA: Diagnosis present

## 2019-02-24 DIAGNOSIS — K859 Acute pancreatitis without necrosis or infection, unspecified: Secondary | ICD-10-CM | POA: Diagnosis not present

## 2019-02-24 DIAGNOSIS — R8271 Bacteriuria: Secondary | ICD-10-CM | POA: Diagnosis present

## 2019-02-24 DIAGNOSIS — Z833 Family history of diabetes mellitus: Secondary | ICD-10-CM

## 2019-02-24 DIAGNOSIS — Z793 Long term (current) use of hormonal contraceptives: Secondary | ICD-10-CM

## 2019-02-24 DIAGNOSIS — Z888 Allergy status to other drugs, medicaments and biological substances status: Secondary | ICD-10-CM

## 2019-02-24 DIAGNOSIS — E872 Acidosis: Secondary | ICD-10-CM | POA: Diagnosis present

## 2019-02-24 DIAGNOSIS — F1010 Alcohol abuse, uncomplicated: Secondary | ICD-10-CM | POA: Diagnosis present

## 2019-02-24 DIAGNOSIS — N179 Acute kidney failure, unspecified: Secondary | ICD-10-CM | POA: Diagnosis present

## 2019-02-24 DIAGNOSIS — Z87891 Personal history of nicotine dependence: Secondary | ICD-10-CM

## 2019-02-24 DIAGNOSIS — Z8249 Family history of ischemic heart disease and other diseases of the circulatory system: Secondary | ICD-10-CM

## 2019-02-24 DIAGNOSIS — I10 Essential (primary) hypertension: Secondary | ICD-10-CM | POA: Diagnosis present

## 2019-02-24 DIAGNOSIS — F10230 Alcohol dependence with withdrawal, uncomplicated: Secondary | ICD-10-CM | POA: Diagnosis present

## 2019-02-24 LAB — CBC WITH DIFFERENTIAL/PLATELET
Abs Immature Granulocytes: 0.04 10*3/uL (ref 0.00–0.07)
Basophils Absolute: 0 10*3/uL (ref 0.0–0.1)
Basophils Relative: 0 %
Eosinophils Absolute: 0 10*3/uL (ref 0.0–0.5)
Eosinophils Relative: 0 %
HCT: 47.2 % — ABNORMAL HIGH (ref 36.0–46.0)
Hemoglobin: 16.2 g/dL — ABNORMAL HIGH (ref 12.0–15.0)
Immature Granulocytes: 0 %
Lymphocytes Relative: 10 %
Lymphs Abs: 1 10*3/uL (ref 0.7–4.0)
MCH: 35.8 pg — ABNORMAL HIGH (ref 26.0–34.0)
MCHC: 34.3 g/dL (ref 30.0–36.0)
MCV: 104.4 fL — ABNORMAL HIGH (ref 80.0–100.0)
Monocytes Absolute: 0.9 10*3/uL (ref 0.1–1.0)
Monocytes Relative: 9 %
Neutro Abs: 8 10*3/uL — ABNORMAL HIGH (ref 1.7–7.7)
Neutrophils Relative %: 81 %
Platelets: 189 10*3/uL (ref 150–400)
RBC: 4.52 MIL/uL (ref 3.87–5.11)
RDW: 13.8 % (ref 11.5–15.5)
WBC: 9.9 10*3/uL (ref 4.0–10.5)
nRBC: 0 % (ref 0.0–0.2)

## 2019-02-24 LAB — CREATININE, SERUM
Creatinine, Ser: 1.13 mg/dL — ABNORMAL HIGH (ref 0.44–1.00)
GFR calc Af Amer: >60 mL/min (ref >60)
GFR calc non Af Amer: >60 mL/min (ref >60)

## 2019-02-24 LAB — URINALYSIS, ROUTINE W REFLEX MICROSCOPIC
Bilirubin Urine: NEGATIVE
Glucose, UA: NEGATIVE mg/dL
Ketones, ur: 80 mg/dL — AB
Nitrite: NEGATIVE
Protein, ur: >=300 mg/dL — AB
Specific Gravity, Urine: 1.015 (ref 1.005–1.030)
pH: 6 (ref 5.0–8.0)

## 2019-02-24 LAB — COMPREHENSIVE METABOLIC PANEL
ALT: 34 U/L (ref 0–44)
AST: 42 U/L — ABNORMAL HIGH (ref 15–41)
Albumin: 4.9 g/dL (ref 3.5–5.0)
Alkaline Phosphatase: 51 U/L (ref 38–126)
Anion gap: 26 — ABNORMAL HIGH (ref 5–15)
BUN: 33 mg/dL — ABNORMAL HIGH (ref 6–20)
CO2: 13 mmol/L — ABNORMAL LOW (ref 22–32)
Calcium: 9.5 mg/dL (ref 8.9–10.3)
Chloride: 92 mmol/L — ABNORMAL LOW (ref 98–111)
Creatinine, Ser: 1.75 mg/dL — ABNORMAL HIGH (ref 0.44–1.00)
GFR calc Af Amer: 45 mL/min — ABNORMAL LOW (ref >60)
GFR calc non Af Amer: 38 mL/min — ABNORMAL LOW (ref >60)
Glucose, Bld: 150 mg/dL — ABNORMAL HIGH (ref 70–99)
Potassium: 4.3 mmol/L (ref 3.5–5.1)
Sodium: 131 mmol/L — ABNORMAL LOW (ref 135–145)
Total Bilirubin: 2.3 mg/dL — ABNORMAL HIGH (ref 0.3–1.2)
Total Protein: 9.3 g/dL — ABNORMAL HIGH (ref 6.5–8.1)

## 2019-02-24 LAB — CBG MONITORING, ED: Glucose-Capillary: 122 mg/dL — ABNORMAL HIGH (ref 70–99)

## 2019-02-24 LAB — I-STAT BETA HCG BLOOD, ED (MC, WL, AP ONLY): I-stat hCG, quantitative: <5 m[IU]/mL (ref <5)

## 2019-02-24 LAB — CBC
HCT: 38.9 % (ref 36.0–46.0)
Hemoglobin: 13.6 g/dL (ref 12.0–15.0)
MCH: 35.8 pg — ABNORMAL HIGH (ref 26.0–34.0)
MCHC: 35 g/dL (ref 30.0–36.0)
MCV: 102.4 fL — ABNORMAL HIGH (ref 80.0–100.0)
Platelets: 148 10*3/uL — ABNORMAL LOW (ref 150–400)
RBC: 3.8 MIL/uL — ABNORMAL LOW (ref 3.87–5.11)
RDW: 13.7 % (ref 11.5–15.5)
WBC: 9.6 10*3/uL (ref 4.0–10.5)
nRBC: 0 % (ref 0.0–0.2)

## 2019-02-24 LAB — SARS CORONAVIRUS 2 BY RT PCR (HOSPITAL ORDER, PERFORMED IN ~~LOC~~ HOSPITAL LAB): SARS Coronavirus 2: NEGATIVE

## 2019-02-24 LAB — LIPASE, BLOOD: Lipase: 842 U/L — ABNORMAL HIGH (ref 11–51)

## 2019-02-24 MED ORDER — VITAMIN B-1 100 MG PO TABS
100.0000 mg | ORAL_TABLET | Freq: Every day | ORAL | Status: DC
  Administered 2019-02-24 – 2019-02-27 (×4): 100 mg via ORAL
  Filled 2019-02-24 (×4): qty 1

## 2019-02-24 MED ORDER — ACETAMINOPHEN 500 MG PO TABS: 1000.0000 mg | ORAL_TABLET | Freq: Four times a day (QID) | ORAL | Status: DC | PRN

## 2019-02-24 MED ORDER — ONDANSETRON HCL 4 MG PO TABS: 4.0000 mg | ORAL_TABLET | Freq: Four times a day (QID) | ORAL | Status: DC | PRN

## 2019-02-24 MED ORDER — MORPHINE SULFATE (PF) 2 MG/ML IV SOLN
2.0000 mg | INTRAVENOUS | Status: DC | PRN
  Administered 2019-02-24 – 2019-02-25 (×3): 2 mg via INTRAVENOUS
  Filled 2019-02-24 (×3): qty 1

## 2019-02-24 MED ORDER — SODIUM CHLORIDE 0.9 % IV BOLUS
1000.0000 mL | Freq: Once | INTRAVENOUS | Status: AC
  Administered 2019-02-24: 12:00:00 1000 mL via INTRAVENOUS

## 2019-02-24 MED ORDER — IOHEXOL 300 MG/ML  SOLN
75.0000 mL | Freq: Once | INTRAMUSCULAR | Status: AC | PRN
  Administered 2019-02-24: 14:00:00 75 mL via INTRAVENOUS

## 2019-02-24 MED ORDER — SODIUM CHLORIDE 0.9 % IV BOLUS
1000.0000 mL | Freq: Once | INTRAVENOUS | Status: AC
  Administered 2019-02-24: 11:00:00 1000 mL via INTRAVENOUS

## 2019-02-24 MED ORDER — ONDANSETRON 4 MG PO TBDP
ORAL_TABLET | ORAL | Status: AC
  Filled 2019-02-24: qty 1

## 2019-02-24 MED ORDER — FAMOTIDINE IN NACL 20-0.9 MG/50ML-% IV SOLN
20.0000 mg | Freq: Once | INTRAVENOUS | Status: AC
  Administered 2019-02-24: 20 mg via INTRAVENOUS
  Filled 2019-02-24: qty 50

## 2019-02-24 MED ORDER — MORPHINE SULFATE (PF) 4 MG/ML IV SOLN
4.0000 mg | Freq: Once | INTRAVENOUS | Status: AC
  Administered 2019-02-24: 11:00:00 4 mg via INTRAVENOUS
  Filled 2019-02-24: qty 1

## 2019-02-24 MED ORDER — OXYCODONE HCL 5 MG PO TABS
10.0000 mg | ORAL_TABLET | ORAL | Status: DC | PRN
  Administered 2019-02-25 – 2019-02-27 (×5): 10 mg via ORAL
  Filled 2019-02-24 (×5): qty 2

## 2019-02-24 MED ORDER — ONDANSETRON 4 MG PO TBDP
4.0000 mg | ORAL_TABLET | Freq: Once | ORAL | Status: AC
  Administered 2019-02-24: 4 mg via ORAL

## 2019-02-24 MED ORDER — FOLIC ACID 1 MG PO TABS
1.0000 mg | ORAL_TABLET | Freq: Every day | ORAL | Status: DC
  Administered 2019-02-24 – 2019-02-27 (×4): 1 mg via ORAL
  Filled 2019-02-24 (×4): qty 1

## 2019-02-24 MED ORDER — POTASSIUM CHLORIDE IN NACL 20-0.9 MEQ/L-% IV SOLN
INTRAVENOUS | Status: AC
  Administered 2019-02-24 – 2019-02-26 (×5): via INTRAVENOUS
  Filled 2019-02-24 (×5): qty 1000

## 2019-02-24 MED ORDER — ENOXAPARIN SODIUM 40 MG/0.4ML ~~LOC~~ SOLN
40.0000 mg | SUBCUTANEOUS | Status: DC
  Administered 2019-02-24 – 2019-02-26 (×3): 40 mg via SUBCUTANEOUS
  Filled 2019-02-24 (×3): qty 0.4

## 2019-02-24 MED ORDER — ADULT MULTIVITAMIN W/MINERALS CH
1.0000 | ORAL_TABLET | Freq: Every day | ORAL | Status: DC
  Administered 2019-02-24 – 2019-02-25 (×2): 1 via ORAL
  Filled 2019-02-24 (×4): qty 1

## 2019-02-24 MED ORDER — ONDANSETRON HCL 4 MG/2ML IJ SOLN
4.0000 mg | Freq: Four times a day (QID) | INTRAMUSCULAR | Status: DC | PRN
  Administered 2019-02-24: 4 mg via INTRAVENOUS
  Filled 2019-02-24: qty 2

## 2019-02-24 MED ORDER — ALBUTEROL SULFATE HFA 108 (90 BASE) MCG/ACT IN AERS
1.0000 | INHALATION_SPRAY | Freq: Four times a day (QID) | RESPIRATORY_TRACT | Status: DC | PRN
  Filled 2019-02-24: qty 6.7

## 2019-02-24 NOTE — ED Provider Notes (Signed)
[facility] Provider Note   CSN: [id number] Arrival date & time: 02/24/19  1009    History   Chief Complaint Chief Complaint  Patient presents with  . Abdominal Pain    HPI Donna Short is a 28 y.o. female with a past medical history of hypertension, pancreatitis, asthma who presents to ED for evaluation of 2-day history of upper abdominal pain, burning sensation, nausea, vomiting.  Reports decreased appetite and unable to tolerate any p.o. intake for the past 2 days.  She has tried extra strength Tylenol but vomited immediately afterwards.  No sick contacts similar symptoms although she does say that someone tested positive for coronavirus at her job but patient herself tested negative 2 weeks ago.  She denies history of similar symptoms in the past.  Denies any urinary symptoms or possibility of pregnancy.  She had a normal bowel movement yesterday and this morning.  She denies any suspicious food ingestions, prior abdominal surgeries, alcohol, tobacco or other drug use, shortness of breath or chest pain.  Patient was seen and evaluated at urgent care and was sent over due to her tachycardia and continued pain.  Patient states that she has cut back on her alcohol intake.  She denies drinking daily.  States that she drinks about 3 times a week which includes 2-3 shots of liquor. Last drink was on 02/21/2019.     HPI  Past Medical History:  Diagnosis Date  . Asthma   . History of chicken pox   . Hypertension   . Migraines   . Pancreatitis 01/2018  . Seizures [facility])     Patient Active Problem List   Diagnosis Date Noted  . Birth control 05/20/2018  . History of pancreatitis 02/11/2018  . Uncomplicated asthma   . Benign essential HTN   . ETOH abuse   . Tobacco abuse   . Acute blood loss anemia   . Alcohol withdrawal seizure, uncomplicated ([place])   . Hypotension due to hypovolemia   . Seizures ([place]) 12/19/2015    Past Surgical  History:  Procedure Laterality Date  . WISDOM TOOTH EXTRACTION       OB History   No obstetric history on file.      Home Medications    Prior to Admission medications   Medication Sig Start Date End Date Taking? Authorizing Provider  acetaminophen (TYLENOL) 500 MG tablet Take 1,000 mg by mouth every 6 (six) hours as needed for mild pain.   Yes [provider]  albuterol (VENTOLIN HFA) 108 (90 Base) MCG/ACT inhaler Inhale 1-2 puffs into the lungs every 6 (six) hours as needed for wheezing or shortness of breath. 02/08/19  Yes Wieters, Hallie C, PA-C  Aspirin-Acetaminophen-Caffeine (EXCEDRIN PO) Take 2 tablets by mouth as needed.   Yes [provider]  medroxyPROGESTERone (DEPO-PROVERA) 150 MG/ML injection Inject 1 mL (150 mg total) into the muscle every 3 (three) months. 05/20/18  Yes Orma Flaming, MD    Family History Family History  Problem Relation Age of Onset  . Diabetes Father   . Arthritis Father   . Alcohol abuse Father   . Breast cancer Mother   . Asthma Mother   . Drug abuse Mother   . Hypertension Mother     Social History Social History   Tobacco Use  . Smoking status: Former Smoker    Types: Cigarettes    Quit date: 01/21/2018    Years since quitting: 1.0  . Smokeless tobacco: Never Used  Substance Use Topics  . Alcohol use: Not Currently    Alcohol/week: 0.0 standard drinks    Comment: " quit drinking before my birthday on 01/10/2018 "  . Drug use: No     Allergies   Claritin [loratadine], Peanut-containing drug products, Benadryl [diphenhydramine hcl (sleep)], Latex, Penicillins, and Shrimp [shellfish allergy]   Review of Systems Review of Systems  Constitutional: Negative for appetite change, chills and fever.  HENT: Negative for ear pain, rhinorrhea, sneezing and sore throat.   Eyes: Negative for photophobia and visual disturbance.  Respiratory: Negative for cough, chest tightness, shortness of breath and wheezing.    Cardiovascular: Negative for chest pain and palpitations.  Gastrointestinal: Positive for abdominal pain, nausea and vomiting. Negative for blood in stool, constipation and diarrhea.  Genitourinary: Negative for dysuria, hematuria and urgency.  Musculoskeletal: Negative for myalgias.  Skin: Negative for rash.  Neurological: Negative for dizziness, weakness and light-headedness.     Physical Exam Updated Vital Signs BP (!) 163/124   Pulse (!) 116   Temp 98.8 F (37.1 C) (Oral)   Resp (!) 24   SpO2 96%   Physical Exam Vitals signs and nursing note reviewed.  Constitutional:      General: She is not in acute distress.    Appearance: She is well-developed.  HENT:     Head: Normocephalic and atraumatic.     Nose: Nose normal.  Eyes:     General: No scleral icterus.       Left eye: No discharge.     Conjunctiva/sclera: Conjunctivae normal.  Neck:     Musculoskeletal: Normal range of motion and neck supple.  Cardiovascular:     Rate and Rhythm: Regular rhythm. Tachycardia present.     Heart sounds: Normal heart sounds. No murmur. No friction rub. No gallop.   Pulmonary:     Effort: Pulmonary effort is normal. No respiratory distress.     Breath sounds: Normal breath sounds.  Abdominal:     General: Bowel sounds are normal. There is no distension.     Palpations: Abdomen is soft.     Tenderness: There is generalized abdominal tenderness and tenderness in the epigastric area. There is no guarding.  Musculoskeletal: Normal range of motion.  Skin:    General: Skin is warm and dry.     Findings: No rash.  Neurological:     Mental Status: She is alert.     Motor: No abnormal muscle tone.     Coordination: Coordination normal.      ED Treatments / Results  Labs (all labs ordered are listed, but only abnormal results are displayed) Labs Reviewed  COMPREHENSIVE METABOLIC PANEL - Abnormal; Notable for the following components:      Result Value   Sodium 131 (*)     Chloride 92 (*)    CO2 13 (*)    Glucose, Bld 150 (*)    BUN 33 (*)    Creatinine, Ser 1.75 (*)    Total Protein 9.3 (*)    AST 42 (*)    Total Bilirubin 2.3 (*)    GFR calc non Af Amer 38 (*)    GFR calc Af Amer 45 (*)    Anion gap 26 (*)    All other components within normal limits  LIPASE, BLOOD - Abnormal; Notable for the following components:   Lipase 842 (*)    All other components within normal limits  CBC WITH DIFFERENTIAL/PLATELET - Abnormal; Notable for the following components:  Hemoglobin 16.2 (*)    HCT 47.2 (*)    MCV 104.4 (*)    MCH 35.8 (*)    Neutro Abs 8.0 (*)    All other components within normal limits  URINALYSIS, ROUTINE W REFLEX MICROSCOPIC - Abnormal; Notable for the following components:   APPearance CLOUDY (*)    Hgb urine dipstick MODERATE (*)    Ketones, ur 80 (*)    Protein, ur >=300 (*)    Leukocytes,Ua LARGE (*)    Bacteria, UA FEW (*)    All other components within normal limits  CBG MONITORING, ED - Abnormal; Notable for the following components:   Glucose-Capillary 122 (*)    All other components within normal limits  SARS CORONAVIRUS 2 (HOSPITAL ORDER, PERFORMED IN [facility])  I-STAT BETA HCG BLOOD, ED ([facility], [facility], [facility] ONLY)    EKG EKG Interpretation  Date/Time:  Thursday February 24 2019 10:33:52 EDT Ventricular Rate:  141 PR Interval:    QRS Duration: 85 QT Interval:  293 QTC Calculation: 449 R Axis:   73 Text Interpretation:  Sinus tachycardia Borderline repolarization abnormality Confirmed by Lorre NickAllen, Anthony ([id number]) on 02/24/2019 11:15:42 AM   Radiology Ct Abdomen Pelvis W Contrast  Result Date: 02/24/2019 CLINICAL DATA:  Pancreatitis, abdominal pain back pain, nausea, and vomiting EXAM: CT ABDOMEN AND PELVIS WITH CONTRAST TECHNIQUE: Multidetector CT imaging of the abdomen and pelvis was performed using the standard protocol following bolus administration of intravenous contrast. CONTRAST:  75mL OMNIPAQUE IOHEXOL 300  MG/ML  SOLN COMPARISON:  01/20/2018 FINDINGS: Lower chest: No acute abnormality. Hepatobiliary: No solid liver abnormality is seen. Hepatic steatosis. No gallstones, gallbladder wall thickening, or biliary dilatation. Pancreas: There is diffuse, extensive inflammatory fat stranding and fluid about the pancreas and adjacent retroperitoneum. Spleen: Normal in size without significant abnormality. Adrenals/Urinary Tract: Adrenal glands are unremarkable. Kidneys are normal, without renal calculi, solid lesion, or hydronephrosis. Bladder is unremarkable. Stomach/Bowel: Stomach is within normal limits. Appendix appears normal. No evidence of bowel wall thickening, distention, or inflammatory changes. Vascular/Lymphatic: No significant vascular findings are present. No enlarged abdominal or pelvic lymph nodes. Reproductive: No mass or other significant abnormality. Other: No abdominal wall hernia or abnormality. Small volume nonspecific ascites. Musculoskeletal: No acute or significant osseous findings. IMPRESSION: 1. There is diffuse, extensive inflammatory fat stranding and fluid about the pancreas and adjacent retroperitoneum. Findings are consistent with acute pancreatitis. No evidence of parenchymal necrosis or discrete fluid collection. 2.  Hepatic steatosis. 3. Small volume nonspecific ascites. Electronically Signed   By: Lauralyn PrimesAlex  Bibbey M.D.   On: 02/24/2019 14:26   Dg Abdomen Acute W/chest  Result Date: 02/24/2019 CLINICAL DATA:  Midline epigastric abdominal pain since yesterday. Nausea and vomiting. EXAM: DG ABDOMEN ACUTE W/ 1V CHEST COMPARISON:  Chest x-ray 01/27/2018 FINDINGS: The upright chest x-ray is normal. The cardiac silhouette, mediastinal and hilar contours are normal and the lungs are clear. Two views of the abdomen demonstrate a normal bowel gas pattern. No findings for obstruction or perforation. The soft tissue shadows of the abdomen are maintained. No worrisome calcifications are identified. The  bony structures are unremarkable. IMPRESSION: 1. No acute cardiopulmonary findings. 2. Normal abdominal radiographs. Electronically Signed   By: Rudie MeyerP.  Gallerani M.D.   On: 02/24/2019 11:32    Procedures Procedures (including critical care time)  Medications Ordered in ED Medications  sodium chloride 0.9 % bolus 1,000 mL (0 mLs Intravenous Stopped 02/24/19 1140)  morphine 4 MG/ML injection 4 mg (4 mg Intravenous Given  02/24/19 1041)  famotidine (PEPCID) IVPB 20 mg premix (0 mg Intravenous Stopped 02/24/19 1140)  sodium chloride 0.9 % bolus 1,000 mL (0 mLs Intravenous Stopped 02/24/19 1340)  iohexol (OMNIPAQUE) 300 MG/ML solution 75 mL (75 mLs Intravenous Contrast Given 02/24/19 1407)     Initial Impression / Assessment and Plan / ED Course  I have reviewed the triage vital signs and the nursing notes.  Pertinent labs & imaging results that were available during my care of the patient were reviewed by me and considered in my medical decision making (see chart for details).  Clinical Course as of Feb 24 1452  Thu Feb 24, 2019  1140 Initial tachycardia of 140s has improved to 120s.   [HK]    Clinical Course User Index [HK] Dietrich PatesKhatri, Dylann Layne, PA-C       28 year old female with a past medical history of pancreatitis, alcohol abuse presents to ED for epigastric abdominal pain, nausea, vomiting.  She is unable to keep any food or drink down for the past 2 days.  She has a history of pancreatitis and is unsure if this feels similar.  She states that she is cut back on her alcohol intake but continues to drink about 3 times a week.  On exam patient tachycardic to 140s.  She denies any chest pain or shortness of breath.  Last drink was on 02/21/2019.  Abdomen is tender in epigastric area without rebound or guarding.  Acute abdominal series is unremarkable.  Lab work significant for anion gap of 26, creatinine of 1.75 elevated from 0.7.  Lipase of 842, hemoglobin of 16.  Urine with large leukocytes and rare  bacteria.  CT confirms acute pancreatitis without complication.  Patient's tachycardia has improved to 110s.  However, due to her ongoing pain and significant dehydration feel that it is best to admit her for ongoing hydration and pain control.  Patient had a negative covid19 test on 02/08/2019, but hospitalist requesting repeat exam due to current admission.   Final Clinical Impressions(s) / ED Diagnoses   Final diagnoses:  Acute pancreatitis without infection or necrosis, unspecified pancreatitis type    ED Discharge Orders    None       Dietrich PatesKhatri, Shayon Trompeter, PA-C 02/24/19 1453    Lorre NickAllen, Anthony, MD 02/27/19 [phone]

## 2019-02-24 NOTE — Discharge Instructions (Signed)
To ED for further evaluation and treatment.  

## 2019-02-24 NOTE — ED Notes (Signed)
Pt unable to provide urine sample at the moment.

## 2019-02-24 NOTE — ED Triage Notes (Signed)
Pt presents with abdominal pain, back pain, fatigue, body weakness, headache, and vomiting since yesterday.

## 2019-02-24 NOTE — ED Notes (Signed)
ED TO INPATIENT HANDOFF REPORT  ED Nurse Name and Phone #:   S Name/Age/Gender Donna Short 28 y.o. female Room/Bed: 021C/021C  Code Status   Code Status: Full Code  Home/SNF/Other Home Patient oriented to: self, place, time and situation Is this baseline? Yes   Triage Complete: Triage complete  Chief Complaint abd pain  Triage Note PT has abdominal pain and vomiting (after drinking something or eating) and has not eaten for 3 days. Back pain, fatigue. States has not had period in 10 years. States normal BM- had one today.    Allergies Allergies  Allergen Reactions  . Claritin [Loratadine] Itching  . Peanut-Containing Drug Products Other (See Comments)  . Benadryl [Diphenhydramine Hcl (Sleep)] Rash  . Latex Rash  . Penicillins Itching and Rash    Has patient had a PCN reaction causing immediate rash, facial/tongue/throat swelling, SOB or lightheadedness with hypotension: Yes Has patient had a PCN reaction causing severe rash involving mucus membranes or skin necrosis: No Has patient had a PCN reaction that required hospitalization: Yes Has patient had a PCN reaction occurring within the last 10 years: No If all of the above answers are "NO", then may proceed with Cephalosporin use.;  . Shrimp [Shellfish Allergy] Hives and Rash    Level of Care/Admitting Diagnosis ED Disposition    ED Disposition Condition Comment   Admit  [facility] [[id number]]  Level of Care: Med-Surg [16]  I expect the patient will be discharged within 24 hours: Yes  LOW acuity---Tx typically complete <24 hrs---ACUTE conditions typically can be evaluated <24 hours---LABS likely to return to acceptable levels <24 hours---IS near functional baseline---EXPECTED to return to current living arrangement---NOT newly hypoxic: Meets criteria for 5C-Observation unit  Covid Evaluation: Asymptomatic Screening Protocol (No Symptoms)  Diagnosis: Acute on chronic pancreatitis  [facility](HCC) [[id number]]) [[id number]]  Admitting Physician: Lahoma CrockerSHEEHAN, THERESA C [[id number]]  Attending Physician: Lahoma CrockerSHEEHAN, THERESA C [[id number]]  PT Class (Do Not Modify): Observation [104]  PT Acc Code (Do Not Modify): Observation [10022]       B Medical/Surgery History Past Medical History:  Diagnosis Date  . Asthma   . History of chicken pox   . Hypertension   . Migraines   . Pancreatitis 01/2018  . Seizures (HCC)    Past Surgical History:  Procedure Laterality Date  . WISDOM TOOTH EXTRACTION       A IV Location/Drains/Wounds Patient Lines/Drains/Airways Status   Active Line/Drains/Airways    Name:   Placement date:   Placement time:   Site:   Days:   Peripheral IV 02/24/19 Right Hand   02/24/19    1034    Hand   less than 1          Intake/Output Last 24 hours  Intake/Output Summary (Last 24 hours) at 02/24/2019 1613 Last data filed at 02/24/2019 1340 Gross per 24 hour  Intake 2050 ml  Output -  Net 2050 ml    Labs/Imaging Results for orders placed or performed during the hospital encounter of 02/24/19 (from the past 48 hour(s))  Comprehensive metabolic panel     Status: Abnormal   Collection Time: 02/24/19 10:32 AM  Result Value Ref Range   Sodium 131 (L) 135 - 145 mmol/L   Potassium 4.3 3.5 - 5.1 mmol/L   Chloride 92 (L) 98 - 111 mmol/L   CO2 13 (L) 22 - 32 mmol/L   Glucose, Bld 150 (H) 70 - 99 mg/dL   BUN 33 (H) 6 - 20  mg/dL   Creatinine, Ser 1.75 (H) 0.44 - 1.00 mg/dL   Calcium 9.5 8.9 - 10.3 mg/dL   Total Protein 9.3 (H) 6.5 - 8.1 g/dL   Albumin 4.9 3.5 - 5.0 g/dL   AST 42 (H) 15 - 41 U/L   ALT 34 0 - 44 U/L   Alkaline Phosphatase 51 38 - 126 U/L   Total Bilirubin 2.3 (H) 0.3 - 1.2 mg/dL   GFR calc non Af Amer 38 (L) >60 mL/min   GFR calc Af Amer 45 (L) >60 mL/min   Anion gap 26 (H) 5 - 15    Comment: Performed at [facility] [address] [place], [place] [address]  Lipase, blood     Status: Abnormal   Collection Time: 02/24/19 10:32 AM  Result Value Ref  Range   Lipase 842 (H) 11 - 51 U/L    Comment: RESULTS CONFIRMED BY MANUAL DILUTION Performed at [facility], [place] [address] [place], [place] [address]   CBC with Differential     Status: Abnormal   Collection Time: 02/24/19 10:32 AM  Result Value Ref Range   WBC 9.9 4.0 - 10.5 K/uL   RBC 4.52 3.87 - 5.11 MIL/uL   Hemoglobin 16.2 (H) 12.0 - 15.0 g/dL   HCT 47.2 (H) 36.0 - 46.0 %   MCV 104.4 (H) 80.0 - 100.0 fL   MCH 35.8 (H) 26.0 - 34.0 pg   MCHC 34.3 30.0 - 36.0 g/dL   RDW 13.8 11.5 - 15.5 %   Platelets 189 150 - 400 K/uL   nRBC 0.0 0.0 - 0.2 %   Neutrophils Relative % 81 %   Neutro Abs 8.0 (H) 1.7 - 7.7 K/uL   Lymphocytes Relative 10 %   Lymphs Abs 1.0 0.7 - 4.0 K/uL   Monocytes Relative 9 %   Monocytes Absolute 0.9 0.1 - 1.0 K/uL   Eosinophils Relative 0 %   Eosinophils Absolute 0.0 0.0 - 0.5 K/uL   Basophils Relative 0 %   Basophils Absolute 0.0 0.0 - 0.1 K/uL   Immature Granulocytes 0 %   Abs Immature Granulocytes 0.04 0.00 - 0.07 K/uL    Comment: Performed at [facility], [place] [address], [place],  [address]  I-Stat beta hCG blood, ED     Status: None   Collection Time: 02/24/19 10:42 AM  Result Value Ref Range   I-stat hCG, quantitative <5.0 <5 mIU/mL   Comment 3            Comment:   GEST. AGE      CONC.  (mIU/mL)   <=1 WEEK        5 - 50     2 WEEKS       50 - 500     3 WEEKS       100 - 10,000     4 WEEKS     1,000 - 30,000        FEMALE AND NON-PREGNANT FEMALE:     LESS THAN 5 mIU/mL   POC CBG, ED     Status: Abnormal   Collection Time: 02/24/19 11:24 AM  Result Value Ref Range   Glucose-Capillary 122 (H) 70 - 99 mg/dL   Comment 1 Notify RN    Comment 2 Document in Chart   Urinalysis, Routine w reflex microscopic     Status: Abnormal   Collection Time: 02/24/19  1:50 PM  Result Value Ref Range  Color, Urine YELLOW YELLOW   APPearance CLOUDY (A) CLEAR   Specific Gravity, Urine 1.015 1.005 - 1.030   pH 6.0 5.0 - 8.0   Glucose, UA  NEGATIVE NEGATIVE mg/dL   Hgb urine dipstick MODERATE (A) NEGATIVE   Bilirubin Urine NEGATIVE NEGATIVE   Ketones, ur 80 (A) NEGATIVE mg/dL   Protein, ur >=161>=300 (A) NEGATIVE mg/dL   Nitrite NEGATIVE NEGATIVE   Leukocytes,Ua LARGE (A) NEGATIVE   RBC / HPF 0-5 0 - 5 RBC/hpf   WBC, UA 21-50 0 - 5 WBC/hpf   Bacteria, UA FEW (A) NONE SEEN   Squamous Epithelial / LPF 6-10 0 - 5   Hyaline Casts, UA PRESENT     Comment: Performed at [facility], [address] [place], [place] [address]   Ct Abdomen Pelvis W Contrast  Result Date: 02/24/2019 CLINICAL DATA:  Pancreatitis, abdominal pain back pain, nausea, and vomiting EXAM: CT ABDOMEN AND PELVIS WITH CONTRAST TECHNIQUE: Multidetector CT imaging of the abdomen and pelvis was performed using the standard protocol following bolus administration of intravenous contrast. CONTRAST:  75mL OMNIPAQUE IOHEXOL 300 MG/ML  SOLN COMPARISON:  01/20/2018 FINDINGS: Lower chest: No acute abnormality. Hepatobiliary: No solid liver abnormality is seen. Hepatic steatosis. No gallstones, gallbladder wall thickening, or biliary dilatation. Pancreas: There is diffuse, extensive inflammatory fat stranding and fluid about the pancreas and adjacent retroperitoneum. Spleen: Normal in size without significant abnormality. Adrenals/Urinary Tract: Adrenal glands are unremarkable. Kidneys are normal, without renal calculi, solid lesion, or hydronephrosis. Bladder is unremarkable. Stomach/Bowel: Stomach is within normal limits. Appendix appears normal. No evidence of bowel wall thickening, distention, or inflammatory changes. Vascular/Lymphatic: No significant vascular findings are present. No enlarged abdominal or pelvic lymph nodes. Reproductive: No mass or other significant abnormality. Other: No abdominal wall hernia or abnormality. Small volume nonspecific ascites. Musculoskeletal: No acute or significant osseous findings. IMPRESSION: 1. There is diffuse, extensive inflammatory fat  stranding and fluid about the pancreas and adjacent retroperitoneum. Findings are consistent with acute pancreatitis. No evidence of parenchymal necrosis or discrete fluid collection. 2.  Hepatic steatosis. 3. Small volume nonspecific ascites. Electronically Signed   By: Lauralyn PrimesAlex  Bibbey M.D.   On: 02/24/2019 14:26   Dg Abdomen Acute W/chest  Result Date: 02/24/2019 CLINICAL DATA:  Midline epigastric abdominal pain since yesterday. Nausea and vomiting. EXAM: DG ABDOMEN ACUTE W/ 1V CHEST COMPARISON:  Chest x-ray 01/27/2018 FINDINGS: The upright chest x-ray is normal. The cardiac silhouette, mediastinal and hilar contours are normal and the lungs are clear. Two views of the abdomen demonstrate a normal bowel gas pattern. No findings for obstruction or perforation. The soft tissue shadows of the abdomen are maintained. No worrisome calcifications are identified. The bony structures are unremarkable. IMPRESSION: 1. No acute cardiopulmonary findings. 2. Normal abdominal radiographs. Electronically Signed   By: Rudie MeyerP.  Gallerani M.D.   On: 02/24/2019 11:32    Pending Labs Unresulted Labs (From admission, onward)    Start     Ordered   03/03/19 0500  Creatinine, serum  (enoxaparin (LOVENOX)    CrCl >/= 30 ml/min)  Weekly,   R    Comments: while on enoxaparin therapy    02/24/19 1515   02/25/19 0500  Basic metabolic panel  Tomorrow morning,   R     02/24/19 1515   02/25/19 0500  CBC  Tomorrow morning,   R     02/24/19 1515   02/25/19 0500  Lipase, blood  Tomorrow morning,   R  02/24/19 1515   02/24/19 1512  HIV antibody (Routine Testing)  Once,   STAT     02/24/19 1515   02/24/19 1512  CBC  (enoxaparin (LOVENOX)    CrCl >/= 30 ml/min)  Once,   STAT    Comments: Baseline for enoxaparin therapy IF NOT ALREADY DRAWN.  Notify MD if PLT < 100 K.    02/24/19 1515   02/24/19 1512  Creatinine, serum  (enoxaparin (LOVENOX)    CrCl >/= 30 ml/min)  Once,   STAT    Comments: Baseline for enoxaparin therapy IF NOT  ALREADY DRAWN.    02/24/19 1515   02/24/19 1420  SARS Coronavirus 2 (CEPHEID - Performed in [facility] hospital lab), Hosp Order  (Asymptomatic Patients Labs)  Once,   STAT    Question:  Rule Out  Answer:  Yes   02/24/19 1419          Vitals/Pain Today's Vitals   02/24/19 1300 02/24/19 1315 02/24/19 1601 02/24/19 1610  BP: (!) 168/124 (!) 163/121  (!) 166/118  Pulse: (!) 116 (!) 116  (!) 115  Resp: (!) 22 (!) 21  17  Temp:      TempSrc:      SpO2: 100% 100%  100%  PainSc:   8      Isolation Precautions No active isolations  Medications Medications  acetaminophen (TYLENOL) tablet 1,000 mg (has no administration in time range)  albuterol (VENTOLIN HFA) 108 (90 Base) MCG/ACT inhaler 1-2 puff (has no administration in time range)  enoxaparin (LOVENOX) injection 40 mg (has no administration in time range)  0.9 % NaCl with KCl 20 mEq/ L  infusion (has no administration in time range)  oxyCODONE (Oxy IR/ROXICODONE) immediate release tablet 10 mg (has no administration in time range)  morphine 2 MG/ML injection 2 mg (2 mg Intravenous Given 02/24/19 1557)  ondansetron (ZOFRAN) tablet 4 mg ( Oral See Alternative 02/24/19 1557)    Or  ondansetron (ZOFRAN) injection 4 mg (4 mg Intravenous Given 02/24/19 1557)  multivitamin with minerals tablet 1 tablet (has no administration in time range)  folic acid (FOLVITE) tablet 1 mg (has no administration in time range)  thiamine (VITAMIN B-1) tablet 100 mg (has no administration in time range)  sodium chloride 0.9 % bolus 1,000 mL (0 mLs Intravenous Stopped 02/24/19 1140)  morphine 4 MG/ML injection 4 mg (4 mg Intravenous Given 02/24/19 1041)  famotidine (PEPCID) IVPB 20 mg premix (0 mg Intravenous Stopped 02/24/19 1140)  sodium chloride 0.9 % bolus 1,000 mL (0 mLs Intravenous Stopped 02/24/19 1340)  iohexol (OMNIPAQUE) 300 MG/ML solution 75 mL (75 mLs Intravenous Contrast Given 02/24/19 1407)    Mobility walks Low fall risk   Focused  Assessments Cardiac Assessment Handoff:    Lab Results  Component Value Date   TROPONINI 0.25 (HH) 01/18/2018   Lab Results  Component Value Date   DDIMER 0.35 05/02/2015   Does the Patient currently have chest pain? No     R Recommendations: See Admitting Provider Note  Report given to:   Additional Notes:

## 2019-02-24 NOTE — ED Triage Notes (Addendum)
PT has abdominal pain and vomiting (after drinking something or eating) and has not eaten for 3 days. Back pain, fatigue. States has not had period in 10 years. States normal BM- had one today.

## 2019-02-24 NOTE — H&P (Signed)
History and Physical    Donna Short NID:[id number] DOB: December 01, 1990 DOA: 02/24/2019  PCP: Orma Flaming, MD  Patient coming from: Home  I have personally briefly reviewed patient's old medical records in [place]  Chief Complaint: Abdominal pain for 2 days  HPI: Donna Short is a 28 y.o. female with medical history significant of hypertension pancreatitis and asthma who presents emergency department with 2 days of abdominal pain located in the upper epigastric area.  The pain is a burning sensation is associated with nausea and vomiting.  She reports a poor appetite since the pain began and she is unable to tolerate any p.o. intake for the past 2 days.  Tried some extra strength Tylenol but vomited it immediately afterwards.  She has had no sick contacts.  She knows has similar symptoms although she said someone tested positive for corona virus at her job.  Patient herself tested -2 weeks ago.  Has no history of similar symptoms in the past she does have a history of pancreatitis.  He denies any urinary symptoms or possibility of pregnancy's.  He had a normal bowel movement yesterday and this morning.  Not had any suspicious food ingestions or prior abdominal surgeries.  Last drank alcohol on Monday.  And has a history of alcohol use.  Denies shortness of breath or chest pain.  She was sent over from urgent care due to tachycardia and continued pain.  Patient denies drinking daily and since her episode of pancreatitis in the past has cut back her drinking to 3 times a week.  When she does drink she drinks 2-3 shots of liquor.  She last drank on Monday (02/21/2019) soda pancreatitis was in June 2019.  ED Course: Lipase elevated, heart rate in the 140s improved to the 1 teens after IV fluids, EKG shows sinus tachycardia at 130 bpm  Review of Systems: As per HPI otherwise all other systems reviewed and  negative.   Past Medical History:  Diagnosis Date   Asthma    History of  chicken pox    Hypertension    Migraines    Pancreatitis 01/2018   Seizures ([place])     Past Surgical History:  Procedure Laterality Date   WISDOM TOOTH EXTRACTION      Social History   Social History Narrative   Lives at home alone.   Right-handed.   No caffeine use.     reports that she quit smoking about 13 months ago. Her smoking use included cigarettes. She has never used smokeless tobacco. She reports current alcohol use of about 9.0 standard drinks of alcohol per week. She reports that she does not use drugs.  Allergies  Allergen Reactions   Claritin [Loratadine] Itching   Peanut-Containing Drug Products Other (See Comments)   Benadryl [Diphenhydramine Hcl (Sleep)] Rash   Latex Rash   Penicillins Itching and Rash    Has patient had a PCN reaction causing immediate rash, facial/tongue/throat swelling, SOB or lightheadedness with hypotension: Yes Has patient had a PCN reaction causing severe rash involving mucus membranes or skin necrosis: No Has patient had a PCN reaction that required hospitalization: Yes Has patient had a PCN reaction occurring within the last 10 years: No If all of the above answers are "NO", then may proceed with Cephalosporin use.;   Shrimp [Shellfish Allergy] Hives and Rash    Family History  Problem Relation Age of Onset   Diabetes Father    Arthritis Father    Alcohol abuse Father  Breast cancer Mother    Asthma Mother    Drug abuse Mother    Hypertension Mother      Prior to Admission medications   Medication Sig Start Date End Date Taking? Authorizing Provider  acetaminophen (TYLENOL) 500 MG tablet Take 1,000 mg by mouth every 6 (six) hours as needed for mild pain.   Yes [provider]  albuterol (VENTOLIN HFA) 108 (90 Base) MCG/ACT inhaler Inhale 1-2 puffs into the lungs every 6 (six) hours as needed for wheezing or shortness of breath. 02/08/19  Yes Wieters, Hallie C, PA-C    Aspirin-Acetaminophen-Caffeine (EXCEDRIN PO) Take 2 tablets by mouth as needed.   Yes [provider]  medroxyPROGESTERone (DEPO-PROVERA) 150 MG/ML injection Inject 1 mL (150 mg total) into the muscle every 3 (three) months. 05/20/18  Yes Orland MustardWolfe, Allison, MD    Physical Exam:  Constitutional: NAD, calm, moderately uncomfortable Vitals:   02/24/19 1300 02/24/19 1315 02/24/19 1610 02/24/19 1651  BP: (!) 168/124 (!) 163/121 (!) 166/118 (!) 166/113  Pulse: (!) 116 (!) 116 (!) 115 (!) 121  Resp: (!) 22 (!) 21 17 15   Temp:    98.4 F (36.9 C)  TempSrc:    Oral  SpO2: 100% 100% 100% 100%   Eyes: PERRL, lids and conjunctivae normal ENMT: Mucous membranes are dry. Posterior pharynx clear of any exudate or lesions.Normal dentition.  Neck: normal, supple, no masses, no thyromegaly Respiratory: clear to auscultation bilaterally, no wheezing, no crackles. Normal respiratory effort. No accessory muscle use.  Cardiovascular: Regular rate and rhythm, no murmurs / rubs / gallops. No extremity edema. 2+ pedal pulses. No carotid bruits.  Abdomen: Epigastric tenderness, no masses palpated. No hepatosplenomegaly. Bowel sounds very hypoactive.  No rebound or guarding Musculoskeletal: no clubbing / cyanosis. No joint deformity upper and lower extremities. Good ROM, no contractures. Normal muscle tone.  Skin: no rashes, lesions, ulcers. No induration Neurologic: CN 2-12 grossly intact. Sensation intact, DTR normal. Strength 5/5 in all 4.  Psychiatric: Normal judgment and insight. Alert and oriented x 3. Normal mood.    Labs on Admission: I have personally reviewed following labs and imaging studies  CBC: Recent Labs  Lab 02/24/19 1032 02/24/19 1648  WBC 9.9 9.6  NEUTROABS 8.0*  --   HGB 16.2* 13.6  HCT 47.2* 38.9  MCV 104.4* 102.4*  PLT 189 148*   Basic Metabolic Panel: Recent Labs  Lab 02/24/19 1032 02/24/19 1648  NA 131*  --   K 4.3  --   CL 92*  --   CO2 13*  --   GLUCOSE 150*   --   BUN 33*  --   CREATININE 1.75* 1.13*  CALCIUM 9.5  --    Liver Function Tests: Recent Labs  Lab 02/24/19 1032  AST 42*  ALT 34  ALKPHOS 51  BILITOT 2.3*  PROT 9.3*  ALBUMIN 4.9   Recent Labs  Lab 02/24/19 1032  LIPASE 842*   CBG: Recent Labs  Lab 02/24/19 1124  GLUCAP 122*   Urine analysis:    Component Value Date/Time   COLORURINE YELLOW 02/24/2019 1350   APPEARANCEUR CLOUDY (A) 02/24/2019 1350   LABSPEC 1.015 02/24/2019 1350   PHURINE 6.0 02/24/2019 1350   GLUCOSEU NEGATIVE 02/24/2019 1350   HGBUR MODERATE (A) 02/24/2019 1350   BILIRUBINUR NEGATIVE 02/24/2019 1350   KETONESUR 80 (A) 02/24/2019 1350   PROTEINUR >=300 (A) 02/24/2019 1350   UROBILINOGEN 0.2 10/07/2015 1958   NITRITE NEGATIVE 02/24/2019 1350   LEUKOCYTESUR LARGE (  A) 02/24/2019 1350    Radiological Exams on Admission: Ct Abdomen Pelvis W Contrast  Result Date: 02/24/2019 CLINICAL DATA:  Pancreatitis, abdominal pain back pain, nausea, and vomiting EXAM: CT ABDOMEN AND PELVIS WITH CONTRAST TECHNIQUE: Multidetector CT imaging of the abdomen and pelvis was performed using the standard protocol following bolus administration of intravenous contrast. CONTRAST:  75mL OMNIPAQUE IOHEXOL 300 MG/ML  SOLN COMPARISON:  01/20/2018 FINDINGS: Lower chest: No acute abnormality. Hepatobiliary: No solid liver abnormality is seen. Hepatic steatosis. No gallstones, gallbladder wall thickening, or biliary dilatation. Pancreas: There is diffuse, extensive inflammatory fat stranding and fluid about the pancreas and adjacent retroperitoneum. Spleen: Normal in size without significant abnormality. Adrenals/Urinary Tract: Adrenal glands are unremarkable. Kidneys are normal, without renal calculi, solid lesion, or hydronephrosis. Bladder is unremarkable. Stomach/Bowel: Stomach is within normal limits. Appendix appears normal. No evidence of bowel wall thickening, distention, or inflammatory changes. Vascular/Lymphatic: No  significant vascular findings are present. No enlarged abdominal or pelvic lymph nodes. Reproductive: No mass or other significant abnormality. Other: No abdominal wall hernia or abnormality. Small volume nonspecific ascites. Musculoskeletal: No acute or significant osseous findings. IMPRESSION: 1. There is diffuse, extensive inflammatory fat stranding and fluid about the pancreas and adjacent retroperitoneum. Findings are consistent with acute pancreatitis. No evidence of parenchymal necrosis or discrete fluid collection. 2.  Hepatic steatosis. 3. Small volume nonspecific ascites. Electronically Signed   By: Lauralyn PrimesAlex  Bibbey M.D.   On: 02/24/2019 14:26   Dg Abdomen Acute W/chest  Result Date: 02/24/2019 CLINICAL DATA:  Midline epigastric abdominal pain since yesterday. Nausea and vomiting. EXAM: DG ABDOMEN ACUTE W/ 1V CHEST COMPARISON:  Chest x-ray 01/27/2018 FINDINGS: The upright chest x-ray is normal. The cardiac silhouette, mediastinal and hilar contours are normal and the lungs are clear. Two views of the abdomen demonstrate a normal bowel gas pattern. No findings for obstruction or perforation. The soft tissue shadows of the abdomen are maintained. No worrisome calcifications are identified. The bony structures are unremarkable. IMPRESSION: 1. No acute cardiopulmonary findings. 2. Normal abdominal radiographs. Electronically Signed   By: Rudie MeyerP.  Gallerani M.D.   On: 02/24/2019 11:32    EKG: Independently reviewed.  It is tachycardia at 141 bpm.  Of my evaluation on telemetry she was in a sinus rhythm at 103 bpm.  Assessment/Plan Principal Problem:   Acute on chronic pancreatitis (HCC) Active Problems:   Alcohol withdrawal seizure, uncomplicated (HCC)   ETOH abuse   Tobacco abuse    1.  Acute on chronic pancreatitis: Patient is able to tolerate sips of liquids but ER is requesting admission due to degree of pain.  That she can tolerate p.o. I will place her in observation overnight.  We will give her  IV fluids at 150 mL an hour.  We will monitor pain and treat as per orders.  Antiemetics and IV pain medications are ordered.  2.  History of alcohol withdrawal seizures and alcohol abuse: Patient with last admission to the hospital in June 2019 now presents almost a year later with same problem.  I discussed with her that any alcohol at all will trigger her pancreatitis.  Patient is encouraged to attend AA meetings.  3.  Tobacco abuse: Patient reports quitting smoking.  DVT prophylaxis:  Lovenox Code Status: Full code Family Communication: Retains capacity no family called. Disposition Plan: Likely home in a.m. Consults called: None Admission status: Observation  It is my clinical opinion that referral for OBSERVATION is reasonable and necessary in this patient based on the  above information provided. The aforementioned taken together are felt to place the patient at high risk for further clinical deterioration. However it is anticipated that the patient may be medically stable for discharge from the hospital within 24 to 48 hours.  Lahoma Crockerheresa C Sevilla Murtagh MD FACP [facility] Pager [phone]- [phone]  How to contact the [facility] Attending or Consulting provider 7A - 7P or covering provider during after hours 7P -7A, for this patient?  1. Check the care team in [facility] and look for a) attending/consulting [facility] provider listed and b) the [facility] team listed 2. Log into [email or web address] and use [facility]'s universal password to access. If you do not have the password, please contact the hospital operator. 3. Locate the [facility] provider you are looking for under [facility] and page to a number that you can be directly reached. 4. If you still have difficulty reaching the provider, please page the [facility] (Director on Call) for the Hospitalists listed on amion for assistance.  If 7PM-7AM, please contact night-coverage [email or web address] Password [email or web address]  02/24/2019, 5:40 PM

## 2019-02-24 NOTE — ED Provider Notes (Addendum)
[facility]    CSN: [id number] Arrival date & time: 02/24/19  [id number]     History   Chief Complaint Chief Complaint  Patient presents with  . Abdominal Pain  . Back Pain  . Weakness  . Vomiting  . Headache    HPI Donna CurlsShamika M Brusca is a 28 y.o. female history of asthma, hypertension, previous pancreatitis, alcohol abuse, presenting today for evaluation of abdominal pain, back pain, nausea and vomiting.  Patient states that she last had a drink on Tuesday.  She has not eaten or drinking much over the past 3 days.  In the past 24 hours she has developed increased abdominal pain, nausea and vomiting.  She denies any fevers chills or body aches.  Denies any URI symptoms of coughing or congestion.  States that her asthma has been under control since 2 weeks ago when she was restarted on albuterol.  Pain was 10 out of 10 last night, but currently 8 out of 10.  HPI  Past Medical History:  Diagnosis Date  . Asthma   . History of chicken pox   . Hypertension   . Migraines   . Pancreatitis 01/2018  . Seizures [facility](HCC)     Patient Active Problem List   Diagnosis Date Noted  . Birth control 05/20/2018  . History of pancreatitis 02/11/2018  . Uncomplicated asthma   . Benign essential HTN   . ETOH abuse   . Tobacco abuse   . Acute blood loss anemia   . Alcohol withdrawal seizure, uncomplicated (HCC)   . Hypotension due to hypovolemia   . Seizures (HCC) 12/19/2015    Past Surgical History:  Procedure Laterality Date  . WISDOM TOOTH EXTRACTION      OB History   No obstetric history on file.      Home Medications    Prior to Admission medications   Medication Sig Start Date End Date Taking? Authorizing Provider  albuterol (VENTOLIN HFA) 108 (90 Base) MCG/ACT inhaler Inhale 1-2 puffs into the lungs every 6 (six) hours as needed for wheezing or shortness of breath. 02/08/19   ,  C, PA-C  Aspirin-Acetaminophen-Caffeine (EXCEDRIN PO) Take 2 tablets by mouth  as needed.    [provider]  medroxyPROGESTERone (DEPO-PROVERA) 150 MG/ML injection Inject 1 mL (150 mg total) into the muscle every 3 (three) months. 05/20/18   Orland MustardWolfe, Allison, MD    Family History Family History  Problem Relation Age of Onset  . Diabetes Father   . Arthritis Father   . Alcohol abuse Father   . Breast cancer Mother   . Asthma Mother   . Drug abuse Mother   . Hypertension Mother     Social History Social History   Tobacco Use  . Smoking status: Former Smoker    Types: Cigarettes    Quit date: 01/21/2018    Years since quitting: 1.0  . Smokeless tobacco: Never Used  Substance Use Topics  . Alcohol use: Not Currently    Alcohol/week: 0.0 standard drinks    Comment: " quit drinking before my birthday on 01/10/2020 "  . Drug use: No     Allergies   Claritin [loratadine], Peanut-containing drug products, Benadryl [diphenhydramine hcl (sleep)], Latex, Penicillins, and Shrimp [shellfish allergy]   Review of Systems Review of Systems  Constitutional: Positive for fatigue. Negative for activity change, appetite change, chills and fever.  HENT: Negative for congestion, ear pain, rhinorrhea, sinus pressure, sore throat and trouble swallowing.   Eyes: Negative for  discharge and redness.  Respiratory: Negative for cough, chest tightness and shortness of breath.   Cardiovascular: Negative for chest pain.  Gastrointestinal: Positive for abdominal pain, nausea and vomiting. Negative for diarrhea.  Musculoskeletal: Negative for myalgias.  Skin: Negative for rash.  Neurological: Negative for dizziness, light-headedness and headaches.     Physical Exam Triage Vital Signs ED Triage Vitals  Enc Vitals Group     BP 02/24/19 0923 (!) 161/122     Pulse Rate 02/24/19 0923 (!) 140     Resp 02/24/19 0923 20     Temp 02/24/19 0923 98.7 F (37.1 C)     Temp src --      SpO2 02/24/19 0923 100 %     Weight --      Height --      Head Circumference --       Peak Flow --      Pain Score 02/24/19 0926 7     Pain Loc --      Pain Edu? --      Excl. in [place]? --    No data found.  Updated Vital Signs BP (!) 161/122   Pulse (!) 140   Temp 98.7 F (37.1 C) Comment: took extra strength tylenol this morning @ 3 am  Resp 20   SpO2 100%   Visual Acuity Right Eye Distance:   Left Eye Distance:   Bilateral Distance:    Right Eye Near:   Left Eye Near:    Bilateral Near:     Physical Exam Vitals signs and nursing note reviewed.  Constitutional:      General: She is not in acute distress.    Appearance: She is well-developed.  HENT:     Head: Normocephalic and atraumatic.  Eyes:     Conjunctiva/sclera: Conjunctivae normal.  Neck:     Musculoskeletal: Neck supple.  Cardiovascular:     Rate and Rhythm: Regular rhythm. Tachycardia present.     Heart sounds: No murmur.  Pulmonary:     Effort: Pulmonary effort is normal. No respiratory distress.     Breath sounds: Normal breath sounds.     Comments: Breathing comfortably at rest, CTABL, no wheezing, rales or other adventitious sounds auscultated Abdominal:     Palpations: Abdomen is soft.     Tenderness: There is abdominal tenderness.     Comments: Tenderness noted to upper abdomen and right upper quadrant  Skin:    General: Skin is warm and dry.  Neurological:     General: No focal deficit present.     Mental Status: She is alert and oriented to person, place, and time.     Comments: Speaking clearly, face symmetric      UC Treatments / Results  Labs (all labs ordered are listed, but only abnormal results are displayed) Labs Reviewed - No data to display  EKG   Radiology No results found.  Procedures Procedures (including critical care time)  Medications Ordered in UC Medications  ondansetron (ZOFRAN-ODT) disintegrating tablet 4 mg (4 mg Oral Given 02/24/19 1000)  ondansetron (ZOFRAN-ODT) 4 MG disintegrating tablet (has no administration in time range)    Initial  Impression / Assessment and Plan / UC Course  I have reviewed the triage vital signs and the nursing notes.  Pertinent labs & imaging results that were available during my care of the patient were reviewed by me and considered in my medical decision making (see chart for details).    Patient with abdominal pain,  tachycardic in high 140s.  Likely has some contributing dehydration due to decreased oral intake in the past few days.  Given significant elevation of heart rate with associated abdominal pain recommending further evaluation in emergency room and observation.Discussed strict return precautions. Patient verbalized understanding and is agreeable with plan.   Final Clinical Impressions(s) / UC Diagnoses   Final diagnoses:  Pain of upper abdomen  Dehydration  Fatigue, unspecified type  Nausea and vomiting, intractability of vomiting not specified, unspecified vomiting type     Discharge Instructions     To ED for further evaluation and treatment    ED Prescriptions    None     Controlled Substance Prescriptions [place] Controlled Substance Registry consulted? Not Applicable   Lew Dawes,  C, PA-C 02/24/19 [address]    , Starr C, [place]PA-C 02/24/19 1006

## 2019-02-24 NOTE — ED Notes (Signed)
Patient is being discharged from the [facility] and sent to the Emergency Department via wheelchair by staff. Per [place], PA, patient is stable but in need of higher level of care due to tachycardia, hypertension, and history of recurrent pancreatitis flare-ups r/t alcohol abuse. Patient is aware and verbalizes understanding of plan of care.  Vitals:   02/24/19 0923  BP: (!) 161/122  Pulse: (!) 140  Resp: 20  Temp: 98.7 F (37.1 C)  SpO2: 100%

## 2019-02-25 DIAGNOSIS — F1020 Alcohol dependence, uncomplicated: Secondary | ICD-10-CM | POA: Diagnosis present

## 2019-02-25 DIAGNOSIS — K852 Alcohol induced acute pancreatitis without necrosis or infection: Secondary | ICD-10-CM | POA: Diagnosis present

## 2019-02-25 DIAGNOSIS — Z833 Family history of diabetes mellitus: Secondary | ICD-10-CM | POA: Diagnosis not present

## 2019-02-25 DIAGNOSIS — K8681 Exocrine pancreatic insufficiency: Secondary | ICD-10-CM | POA: Diagnosis present

## 2019-02-25 DIAGNOSIS — Z91040 Latex allergy status: Secondary | ICD-10-CM | POA: Diagnosis not present

## 2019-02-25 DIAGNOSIS — I10 Essential (primary) hypertension: Secondary | ICD-10-CM | POA: Diagnosis present

## 2019-02-25 DIAGNOSIS — K859 Acute pancreatitis without necrosis or infection, unspecified: Secondary | ICD-10-CM | POA: Diagnosis present

## 2019-02-25 DIAGNOSIS — Z888 Allergy status to other drugs, medicaments and biological substances status: Secondary | ICD-10-CM | POA: Diagnosis not present

## 2019-02-25 DIAGNOSIS — Z825 Family history of asthma and other chronic lower respiratory diseases: Secondary | ICD-10-CM | POA: Diagnosis not present

## 2019-02-25 DIAGNOSIS — N179 Acute kidney failure, unspecified: Secondary | ICD-10-CM | POA: Diagnosis present

## 2019-02-25 DIAGNOSIS — Z811 Family history of alcohol abuse and dependence: Secondary | ICD-10-CM | POA: Diagnosis not present

## 2019-02-25 DIAGNOSIS — E872 Acidosis: Secondary | ICD-10-CM | POA: Diagnosis present

## 2019-02-25 DIAGNOSIS — Z8249 Family history of ischemic heart disease and other diseases of the circulatory system: Secondary | ICD-10-CM | POA: Diagnosis not present

## 2019-02-25 DIAGNOSIS — J45909 Unspecified asthma, uncomplicated: Secondary | ICD-10-CM | POA: Diagnosis present

## 2019-02-25 DIAGNOSIS — Z793 Long term (current) use of hormonal contraceptives: Secondary | ICD-10-CM | POA: Diagnosis not present

## 2019-02-25 DIAGNOSIS — Z880 Allergy status to penicillin: Secondary | ICD-10-CM | POA: Diagnosis not present

## 2019-02-25 DIAGNOSIS — Z1159 Encounter for screening for other viral diseases: Secondary | ICD-10-CM | POA: Diagnosis not present

## 2019-02-25 DIAGNOSIS — R6510 Systemic inflammatory response syndrome (SIRS) of non-infectious origin without acute organ dysfunction: Secondary | ICD-10-CM | POA: Diagnosis present

## 2019-02-25 DIAGNOSIS — Z87891 Personal history of nicotine dependence: Secondary | ICD-10-CM | POA: Diagnosis not present

## 2019-02-25 DIAGNOSIS — E860 Dehydration: Secondary | ICD-10-CM | POA: Diagnosis present

## 2019-02-25 DIAGNOSIS — K861 Other chronic pancreatitis: Secondary | ICD-10-CM | POA: Diagnosis not present

## 2019-02-25 DIAGNOSIS — K860 Alcohol-induced chronic pancreatitis: Secondary | ICD-10-CM | POA: Diagnosis present

## 2019-02-25 DIAGNOSIS — R8271 Bacteriuria: Secondary | ICD-10-CM | POA: Diagnosis present

## 2019-02-25 LAB — BASIC METABOLIC PANEL
Anion gap: 10 (ref 5–15)
BUN: 16 mg/dL (ref 6–20)
CO2: 19 mmol/L — ABNORMAL LOW (ref 22–32)
Calcium: 8.9 mg/dL (ref 8.9–10.3)
Chloride: 105 mmol/L (ref 98–111)
Creatinine, Ser: 0.82 mg/dL (ref 0.44–1.00)
GFR calc Af Amer: >60 mL/min (ref >60)
GFR calc non Af Amer: >60 mL/min (ref >60)
Glucose, Bld: 111 mg/dL — ABNORMAL HIGH (ref 70–99)
Potassium: 4 mmol/L (ref 3.5–5.1)
Sodium: 134 mmol/L — ABNORMAL LOW (ref 135–145)

## 2019-02-25 LAB — CBC
HCT: 35.9 % — ABNORMAL LOW (ref 36.0–46.0)
Hemoglobin: 12.3 g/dL (ref 12.0–15.0)
MCH: 35.4 pg — ABNORMAL HIGH (ref 26.0–34.0)
MCHC: 34.3 g/dL (ref 30.0–36.0)
MCV: 103.5 fL — ABNORMAL HIGH (ref 80.0–100.0)
Platelets: 120 10*3/uL — ABNORMAL LOW (ref 150–400)
RBC: 3.47 MIL/uL — ABNORMAL LOW (ref 3.87–5.11)
RDW: 13.6 % (ref 11.5–15.5)
WBC: 7.3 10*3/uL (ref 4.0–10.5)
nRBC: 0 % (ref 0.0–0.2)

## 2019-02-25 LAB — LIPASE, BLOOD: Lipase: 613 U/L — ABNORMAL HIGH (ref 11–51)

## 2019-02-25 MED ORDER — MORPHINE SULFATE (PF) 2 MG/ML IV SOLN
2.0000 mg | INTRAVENOUS | Status: DC | PRN
  Administered 2019-02-25: 22:00:00 2 mg via INTRAVENOUS
  Filled 2019-02-25: qty 1

## 2019-02-25 NOTE — Progress Notes (Signed)
.   [facility] PROGRESS NOTE  Donna Short KNL:[id number] DOB: 1991/07/11 DOA: 02/24/2019 PCP: Orma Flaming, MD  P SIRS secondary to acute pancreatitis Sinus tachycardia secondary to volume depletion/Sirs AKI on admission secondary to above Afebrile-would not treat with antibiotics-CT does not show any necrosis Continue saline lower rate 100 cc/H-labs are improving Lipase is down Continue to treat Sirs physiology May be able to graduate diet to soft?-She is willing to eat patient states that her pain is relatively well controlled at this time I have encouraged her to ambulate the unit Asthma/COPD? Inhalers as needed-she does not need them at this time it looks like Prior alcoholic seizure?-Supposed to follow-up with neurology in the past previously was on Keppra Will need outpatient follow-up as guided by Dr. Aletha Halim for PCP She was admitted to the ICU 01/2018 for similar issues Would monitor her for behavioral issues but she probably is outside of the window for withdrawal as her last drink was 6/29 Contraception On Depo-Provera-outpatient follow-up PCP Asymptomatic bacteriuria Patient has no pressure or dysuria-no antibiotics   --- Synopsis 28 year old African-American female, nulliparous Chronic ethanolism, HTN, asthma, prior tobacco Has had alcoholic withdrawal seizures in the past? High stress job-tells me she drinks mainly because therapy for some of her slight underlying psychological issues has not helped Admitted with pancreatitis-initial lipase 842 LFTs however no specific pattern of steatosis versus obstruction Also volume depleted on admission BUN to creatinine 33/1.7 with mild anion gap acidosis likely from vomiting   DVT Lovenox Code Status: Full Communication: Family alone Disposition Plan: Inpatient at this time   Verlon Au, MD  [facility] Via [employer] app OR [email or web address] 7PM-7AM contact night coverage as above 02/25/2019, 9:50 AM  LOS: 0 days   ---  Consultants:  None  Procedures:  No  Antimicrobials:  No --- Today Awake coherent pleasant asking to eat No fever no chills No dysuria  O  Vitals:  Vitals:   02/24/19 1957 02/25/19 0435  BP: (!) 148/113 (!) 144/107  Pulse: (!) 112 92  Resp: 18 17  Temp: 97.8 F (36.6 C) 99.1 F (37.3 C)  SpO2: 100% 100%    Exam:  Awake coherent pleasant no distress no icterus no pallor neck soft supple CTA B S1-S2 no murmur Abdomen soft slightly tender epigastrium right upper quadrant and distended no rebound No lower extremity edema Neurologically intact Coherent Pressured speech   I have personally reviewed the following:   DATA  BUNs/creatinine 33/1.7-->16/0.8 Bicarb 13/19 Anion gap 26-->10   Scheduled Meds: . enoxaparin (LOVENOX) injection  40 mg Subcutaneous Q24H  . folic acid  1 mg Oral Daily  . multivitamin with minerals  1 tablet Oral Daily  . thiamine  100 mg Oral Daily   Continuous Infusions: . 0.9 % NaCl with KCl 20 mEq / L 150 mL/hr at 02/25/19 0603    Principal Problem:   Acute on chronic pancreatitis [facility]) Active Problems:   Alcohol withdrawal seizure, uncomplicated (HCC)   ETOH abuse   Tobacco abuse   LOS: 0 days

## 2019-02-25 NOTE — Plan of Care (Signed)
  Problem: Education: Goal: Knowledge of Pancreatitis treatment and prevention will improve Outcome: Progressing   

## 2019-02-26 LAB — COMPREHENSIVE METABOLIC PANEL
ALT: 29 U/L (ref 0–44)
AST: 53 U/L — ABNORMAL HIGH (ref 15–41)
Albumin: 3.5 g/dL (ref 3.5–5.0)
Alkaline Phosphatase: 32 U/L — ABNORMAL LOW (ref 38–126)
Anion gap: 13 (ref 5–15)
BUN: 11 mg/dL (ref 6–20)
CO2: 17 mmol/L — ABNORMAL LOW (ref 22–32)
Calcium: 9.3 mg/dL (ref 8.9–10.3)
Chloride: 105 mmol/L (ref 98–111)
Creatinine, Ser: 0.7 mg/dL (ref 0.44–1.00)
GFR calc Af Amer: >60 mL/min (ref >60)
GFR calc non Af Amer: >60 mL/min (ref >60)
Glucose, Bld: 96 mg/dL (ref 70–99)
Potassium: 4 mmol/L (ref 3.5–5.1)
Sodium: 135 mmol/L (ref 135–145)
Total Bilirubin: 0.8 mg/dL (ref 0.3–1.2)
Total Protein: 7.1 g/dL (ref 6.5–8.1)

## 2019-02-26 LAB — HIV ANTIBODY (ROUTINE TESTING W REFLEX): HIV Screen 4th Generation wRfx: NONREACTIVE

## 2019-02-26 MED ORDER — PANTOPRAZOLE SODIUM 40 MG PO TBEC
40.0000 mg | DELAYED_RELEASE_TABLET | Freq: Two times a day (BID) | ORAL | Status: DC
  Administered 2019-02-26 – 2019-02-27 (×3): 40 mg via ORAL
  Filled 2019-02-26 (×3): qty 1

## 2019-02-26 MED ORDER — TRAMADOL HCL 50 MG PO TABS: 50.0000 mg | ORAL_TABLET | Freq: Four times a day (QID) | ORAL | 0 refills | Status: DC | PRN

## 2019-02-26 MED ORDER — HYDRALAZINE HCL 20 MG/ML IJ SOLN
5.0000 mg | Freq: Once | INTRAMUSCULAR | Status: AC
  Administered 2019-02-26: 23:00:00 5 mg via INTRAVENOUS
  Filled 2019-02-26: qty 1

## 2019-02-26 MED ORDER — PANTOPRAZOLE SODIUM 40 MG PO TBEC: 40.0000 mg | DELAYED_RELEASE_TABLET | Freq: Every day | ORAL | 1 refills | Status: DC

## 2019-02-26 MED ORDER — POTASSIUM CHLORIDE IN NACL 20-0.9 MEQ/L-% IV SOLN
INTRAVENOUS | Status: DC
  Administered 2019-02-26: 22:00:00 via INTRAVENOUS
  Filled 2019-02-26: qty 1000

## 2019-02-26 NOTE — Progress Notes (Signed)
RN notified [facility] MD on-call about  Pt's high BP.  MD  care order is to continue to monitor patient. NO med at this time.

## 2019-02-26 NOTE — Plan of Care (Signed)
  Problem: Education: Goal: Knowledge of Pancreatitis treatment and prevention will improve Outcome: Progressing   Problem: Health Behavior/Discharge Planning: Goal: Ability to formulate a plan to maintain an alcohol-free life will improve Outcome: Progressing   Problem: Nutritional: Goal: Ability to achieve adequate nutritional intake will improve Outcome: Progressing   Problem: Clinical Measurements: Goal: Complications related to the disease process, condition or treatment will be avoided or minimized Outcome: Progressing   

## 2019-02-26 NOTE — Plan of Care (Signed)
  Problem: Education: Goal: Knowledge of Pancreatitis treatment and prevention will improve 02/26/2019 2019 by Corinna Lines, RN Outcome: Progressing 02/26/2019 2019 by Corinna Lines, RN Outcome: Progressing   Problem: Pain Managment: Goal: General experience of comfort will improve Outcome: Progressing   Problem: Safety: Goal: Ability to remain free from injury will improve Outcome: Progressing

## 2019-02-26 NOTE — Progress Notes (Signed)
Patient's BP elevated, paged Dr. Verlon Au- he is aware.

## 2019-02-26 NOTE — Discharge Summary (Signed)
Physician Discharge Summary  Donna Short ONG:[id number] DOB: 1990-10-31 DOA: 02/24/2019  PCP: Orland MustardWolfe, Allison, MD  Admit date: 02/24/2019 Discharge date: 02/26/2019  Time spent: 25 minutes  Recommendations for Outpatient Follow-up:  Soft diet for the next several days Advised patient to stop drinking will need outpatient follow-up Needs LFTs about 1 month Recommend continued follow-up with her therapist  Discharge Diagnoses:  Principal Problem:   Acute on chronic pancreatitis (HCC) Active Problems:   Alcohol withdrawal seizure, uncomplicated (HCC)   ETOH abuse   Tobacco abuse   Acute pancreatitis   Discharge Condition: Improved  Diet recommendation: Soft  There were no vitals filed for this visit.  History of present illness:  28 year old African-American female, nulliparous Chronic ethanolism, HTN, asthma, prior tobacco Has had alcoholic withdrawal seizures in the past? High stress job-tells me she drinks mainly because therapy for some of her  underlying psychological issues has not helped Admitted with pancreatitis-initial lipase 842 LFTs however no specific pattern of steatosis versus obstruction Also volume depleted on admission BUN to creatinine 33/1.7 with mild anion gap acidosis likely from vomiting  Progress slowly over the course of hospital stay and improved to the point that it was felt she was close to discharge on 7/5  Hospital Course:  SIRS secondary to acute pancreatitis Sinus tachycardia secondary to volume depletion/Sirs AKI on admission secondary to above Afebrile-would not treat with antibiotics-CT does not show any necrosis Was initially on high amounts of saline for volume repletion Lipase is down Continue to treat Sirs physiology Graduated diet on 7/4 soft diet she would tolerate a soft diet on discharge Asthma/COPD? Inhalers as needed-she does not need them at this time it looks like Prior alcoholic seizure?-Supposed to follow-up with  neurology in the past previously was on Keppra Will need outpatient follow-up as guided by Dr. Sheppard PentonWolf her PCP PCP She was admitted to the ICU 01/2018 for similar issues Would monitor her for behavioral issues but she probably is outside of the window for withdrawal as her last drink was 6/29-was stable during hospital stay Contraception On Depo-Provera for several years-outpatient follow-up PCP Asymptomatic bacteriuria Patient has no pressure or dysuria-no antibiotics Uncontrolled hypertension New diagnosis likely secondary to significant alcohol use-would recommend that she stop drinking and recheck blood pressure several times at PCP office prior to decision of initiation of meds-she also has significant psychosocial stressors likely causing her pressure to be out-these should be treated concomitantly  Discharge Exam: Vitals:   02/26/19 0459 02/26/19 0758  BP: (!) 160/115 (!) 168/114  Pulse:  84  Resp:    Temp:    SpO2:  100%    General: Awake alert coherent no distress Cardiovascular: S1-S2 no murmur like tachycardia Respiratory: Clinically clear no added sound Abdomen soft no rebound slightly distended no guarding  Discharge Instructions   Discharge Instructions    Diet general   Complete by: As directed    Discharge instructions   Complete by: As directed    Take it easy on the diet for 3-4 days--no grease, butter or "rich" foods No drinking AT ALL alcohol Take protonic for heart burn   Discharge patient   Complete by: As directed    Discharge disposition: 01-Home or Self Care   Discharge patient date: 02/26/2019   Increase activity slowly   Complete by: As directed      Allergies as of 02/26/2019      Reactions   Claritin [loratadine] Itching   Peanut-containing Drug Products Other (See Comments)  Benadryl [diphenhydramine Hcl (sleep)] Rash   Latex Rash   Penicillins Itching, Rash   Has patient had a PCN reaction causing immediate rash, facial/tongue/throat  swelling, SOB or lightheadedness with hypotension: Yes Has patient had a PCN reaction causing severe rash involving mucus membranes or skin necrosis: No Has patient had a PCN reaction that required hospitalization: Yes Has patient had a PCN reaction occurring within the last 10 years: No If all of the above answers are "NO", then may proceed with Cephalosporin use.;   Shrimp [shellfish Allergy] Hives, Rash      Medication List    STOP taking these medications   EXCEDRIN PO     TAKE these medications   acetaminophen 500 MG tablet Commonly known as: TYLENOL Take 1,000 mg by mouth every 6 (six) hours as needed for mild pain.   albuterol 108 (90 Base) MCG/ACT inhaler Commonly known as: VENTOLIN HFA Inhale 1-2 puffs into the lungs every 6 (six) hours as needed for wheezing or shortness of breath.   Depo-Provera 150 MG/ML injection Generic drug: medroxyPROGESTERone Inject 1 mL (150 mg total) into the muscle every 3 (three) months.   pantoprazole 40 MG tablet Commonly known as: Protonix Take 1 tablet (40 mg total) by mouth daily.   traMADol 50 MG tablet Commonly known as: Ultram Take 1 tablet (50 mg total) by mouth every 6 (six) hours as needed.      Allergies  Allergen Reactions  . Claritin [Loratadine] Itching  . Peanut-Containing Drug Products Other (See Comments)  . Benadryl [Diphenhydramine Hcl (Sleep)] Rash  . Latex Rash  . Penicillins Itching and Rash    Has patient had a PCN reaction causing immediate rash, facial/tongue/throat swelling, SOB or lightheadedness with hypotension: Yes Has patient had a PCN reaction causing severe rash involving mucus membranes or skin necrosis: No Has patient had a PCN reaction that required hospitalization: Yes Has patient had a PCN reaction occurring within the last 10 years: No If all of the above answers are "NO", then may proceed with Cephalosporin use.;  . Shrimp [Shellfish Allergy] Hives and Rash      The results of  significant diagnostics from this hospitalization (including imaging, microbiology, ancillary and laboratory) are listed below for reference.    Significant Diagnostic Studies: Ct Abdomen Pelvis W Contrast  Result Date: 02/24/2019 CLINICAL DATA:  Pancreatitis, abdominal pain back pain, nausea, and vomiting EXAM: CT ABDOMEN AND PELVIS WITH CONTRAST TECHNIQUE: Multidetector CT imaging of the abdomen and pelvis was performed using the standard protocol following bolus administration of intravenous contrast. CONTRAST:  8mL OMNIPAQUE IOHEXOL 300 MG/ML  SOLN COMPARISON:  01/20/2018 FINDINGS: Lower chest: No acute abnormality. Hepatobiliary: No solid liver abnormality is seen. Hepatic steatosis. No gallstones, gallbladder wall thickening, or biliary dilatation. Pancreas: There is diffuse, extensive inflammatory fat stranding and fluid about the pancreas and adjacent retroperitoneum. Spleen: Normal in size without significant abnormality. Adrenals/Urinary Tract: Adrenal glands are unremarkable. Kidneys are normal, without renal calculi, solid lesion, or hydronephrosis. Bladder is unremarkable. Stomach/Bowel: Stomach is within normal limits. Appendix appears normal. No evidence of bowel wall thickening, distention, or inflammatory changes. Vascular/Lymphatic: No significant vascular findings are present. No enlarged abdominal or pelvic lymph nodes. Reproductive: No mass or other significant abnormality. Other: No abdominal wall hernia or abnormality. Small volume nonspecific ascites. Musculoskeletal: No acute or significant osseous findings. IMPRESSION: 1. There is diffuse, extensive inflammatory fat stranding and fluid about the pancreas and adjacent retroperitoneum. Findings are consistent with acute pancreatitis. No evidence of parenchymal  necrosis or discrete fluid collection. 2.  Hepatic steatosis. 3. Small volume nonspecific ascites. Electronically Signed   By: Lauralyn PrimesAlex  Bibbey M.D.   On: 02/24/2019 14:26   Dg  Abdomen Acute W/chest  Result Date: 02/24/2019 CLINICAL DATA:  Midline epigastric abdominal pain since yesterday. Nausea and vomiting. EXAM: DG ABDOMEN ACUTE W/ 1V CHEST COMPARISON:  Chest x-ray 01/27/2018 FINDINGS: The upright chest x-ray is normal. The cardiac silhouette, mediastinal and hilar contours are normal and the lungs are clear. Two views of the abdomen demonstrate a normal bowel gas pattern. No findings for obstruction or perforation. The soft tissue shadows of the abdomen are maintained. No worrisome calcifications are identified. The bony structures are unremarkable. IMPRESSION: 1. No acute cardiopulmonary findings. 2. Normal abdominal radiographs. Electronically Signed   By: Rudie MeyerP.  Gallerani M.D.   On: 02/24/2019 11:32    Microbiology: Recent Results (from the past 240 hour(s))  SARS Coronavirus 2 (CEPHEID - Performed in [facility] lab), Hosp Order     Status: None   Collection Time: 02/24/19  3:41 PM   Specimen: Nasopharyngeal Swab  Result Value Ref Range Status   SARS Coronavirus 2 NEGATIVE NEGATIVE Final    Comment: (NOTE) If result is NEGATIVE SARS-CoV-2 target nucleic acids are NOT DETECTED. The SARS-CoV-2 RNA is generally detectable in upper and lower  respiratory specimens during the acute phase of infection. The lowest  concentration of SARS-CoV-2 viral copies this assay can detect is 250  copies / mL. A negative result does not preclude SARS-CoV-2 infection  and should not be used as the sole basis for treatment or other  patient management decisions.  A negative result may occur with  improper specimen collection / handling, submission of specimen other  than nasopharyngeal swab, presence of viral mutation(s) within the  areas targeted by this assay, and inadequate number of viral copies  (<250 copies / mL). A negative result must be combined with clinical  observations, patient history, and epidemiological information. If result is POSITIVE SARS-CoV-2 target  nucleic acids are DETECTED. The SARS-CoV-2 RNA is generally detectable in upper and lower  respiratory specimens dur ing the acute phase of infection.  Positive  results are indicative of active infection with SARS-CoV-2.  Clinical  correlation with patient history and other diagnostic information is  necessary to determine patient infection status.  Positive results do  not rule out bacterial infection or co-infection with other viruses. If result is PRESUMPTIVE POSTIVE SARS-CoV-2 nucleic acids MAY BE PRESENT.   A presumptive positive result was obtained on the submitted specimen  and confirmed on repeat testing.  While 2019 novel coronavirus  (SARS-CoV-2) nucleic acids may be present in the submitted sample  additional confirmatory testing may be necessary for epidemiological  and / or clinical management purposes  to differentiate between  SARS-CoV-2 and other Sarbecovirus currently known to infect humans.  If clinically indicated additional testing with an alternate test  methodology [phone] is advised. The SARS-CoV-2 RNA is generally  detectable in upper and lower respiratory sp ecimens during the acute  phase of infection. The expected result is Negative. Fact Sheet for Patients:  BoilerBrush.com.cyhttps://www.fda.gov/media/136312/download Fact Sheet for Healthcare Providers: https://pope.com/https://www.fda.gov/media/136313/download This test is not yet approved or cleared by the [place] FDA and has been authorized for detection and/or diagnosis of SARS-CoV-2 by FDA under an Emergency Use Authorization (EUA).  This EUA will remain in effect (meaning this test can be used) for the duration of the COVID-19 declaration under Section 564(b)(1) of the  Act, 21 U.S.C. section 360bbb-3(b)(1), unless the authorization is terminated or revoked sooner. Performed at [facility], [address] [place], [place] [address]      Labs: Basic Metabolic Panel: Recent Labs  Lab 02/24/19 1032 02/24/19 1648  02/25/19 0448 02/26/19 0329  NA 131*  --  134* 135  K 4.3  --  4.0 4.0  CL 92*  --  105 105  CO2 13*  --  19* 17*  GLUCOSE 150*  --  111* 96  BUN 33*  --  16 11  CREATININE 1.75* 1.13* 0.82 0.70  CALCIUM 9.5  --  8.9 9.3   Liver Function Tests: Recent Labs  Lab 02/24/19 1032 02/26/19 0329  AST 42* 53*  ALT 34 29  ALKPHOS 51 32*  BILITOT 2.3* 0.8  PROT 9.3* 7.1  ALBUMIN 4.9 3.5   Recent Labs  Lab 02/24/19 1032 02/25/19 0448  LIPASE 842* 613*   No results for input(s): AMMONIA in the last 168 hours. CBC: Recent Labs  Lab 02/24/19 1032 02/24/19 1648 02/25/19 0448  WBC 9.9 9.6 7.3  NEUTROABS 8.0*  --   --   HGB 16.2* 13.6 12.3  HCT 47.2* 38.9 35.9*  MCV 104.4* 102.4* 103.5*  PLT 189 148* 120*   Cardiac Enzymes: No results for input(s): CKTOTAL, CKMB, CKMBINDEX, TROPONINI in the last 168 hours. BNP: BNP (last 3 results) No results for input(s): BNP in the last 8760 hours.  ProBNP (last 3 results) No results for input(s): PROBNP in the last 8760 hours.  CBG: Recent Labs  Lab 02/24/19 1124  GLUCAP 122*       Signed:  Rhetta MuraJai-Gurmukh Hellena Pridgen MD   [facility] 02/26/2019, 1:32 PM

## 2019-02-27 ENCOUNTER — Encounter: Payer: Self-pay | Admitting: Family Medicine

## 2019-02-27 ENCOUNTER — Encounter (HOSPITAL_COMMUNITY): Payer: Self-pay

## 2019-02-27 LAB — COMPREHENSIVE METABOLIC PANEL
ALT: 46 U/L — ABNORMAL HIGH (ref 0–44)
AST: 72 U/L — ABNORMAL HIGH (ref 15–41)
Albumin: 3.7 g/dL (ref 3.5–5.0)
Alkaline Phosphatase: 35 U/L — ABNORMAL LOW (ref 38–126)
Anion gap: 13 (ref 5–15)
BUN: 11 mg/dL (ref 6–20)
CO2: 22 mmol/L (ref 22–32)
Calcium: 9.7 mg/dL (ref 8.9–10.3)
Chloride: 100 mmol/L (ref 98–111)
Creatinine, Ser: 0.54 mg/dL (ref 0.44–1.00)
GFR calc Af Amer: >60 mL/min (ref >60)
GFR calc non Af Amer: >60 mL/min (ref >60)
Glucose, Bld: 93 mg/dL (ref 70–99)
Potassium: 4 mmol/L (ref 3.5–5.1)
Sodium: 135 mmol/L (ref 135–145)
Total Bilirubin: 0.6 mg/dL (ref 0.3–1.2)
Total Protein: 7.5 g/dL (ref 6.5–8.1)

## 2019-02-27 LAB — LIPASE, BLOOD: Lipase: 214 U/L — ABNORMAL HIGH (ref 11–51)

## 2019-02-27 NOTE — Progress Notes (Signed)
Patient discharged to home. Verbalizes understanding of all discharge instructions including discharge medications and follow up MD visits. Patient provided with Ultram printed RX. Instructed the patient to refrain from alcohol use. Patient self ambulated from room to car for discharge.

## 2019-02-27 NOTE — Progress Notes (Signed)
Seen and examined and agree with discharge summary from yesterday-no changes today-reiterated that she needs counseling for substance abuse needs to see her therapist and should go home on a soft diet Letter was given to her for excuse from work until 7/8

## 2019-02-27 NOTE — Progress Notes (Signed)
2125  Pt's BP: 182/123 (141), asymptomatic. Paged MD made known.  2218 MD ordered to check BP manually. BP: 180/110, paged MD again.  2300 Given hydralazine 5 mg once per MD order.   2315 Rechecked BP: 162/123 (135) PR: 89, lying in bed comfortably, will continue to monitor.

## 2019-03-01 ENCOUNTER — Telehealth: Payer: Self-pay

## 2019-03-01 NOTE — Telephone Encounter (Signed)
LM to return call for TCM. 

## 2019-03-02 NOTE — Telephone Encounter (Signed)
Left voice message for patient to call clinic.  

## 2019-03-09 NOTE — Progress Notes (Deleted)
Donna Short is a 28 y.o. female is here to follow up on Hypotension  I acted as a Neurosurgeonscribe for Energy East CorporationSamantha Worley, PA-C Corky Mullonna Berish Bohman, LPN  History of Present Illness:   No chief complaint on file.   HPI  Health Maintenance Due  Topic Date Due  . TETANUS/TDAP  01/11/2008    Past Medical History:  Diagnosis Date  . Asthma   . History of chicken pox   . Hypertension   . Migraines   . Pancreatitis 01/2018  . Seizures (HCC)      Social History   Socioeconomic History  . Marital status: Single    Spouse name: Not on file  . Number of children: 0  . Years of education: 2 years college  . Highest education level: Not on file  Occupational History  . Occupation: Clinical research associateDeli at [employer]  Social Needs  . Financial resource strain: Not on file  . Food insecurity    Worry: Not on file    Inability: Not on file  . Transportation needs    Medical: Not on file    Non-medical: Not on file  Tobacco Use  . Smoking status: Former Smoker    Types: Cigarettes    Quit date: 01/21/2018    Years since quitting: 1.1  . Smokeless tobacco: Never Used  Substance and Sexual Activity  . Alcohol use: Yes    Alcohol/week: 9.0 standard drinks    Types: 9 Shots of liquor per week    Comment: 02/2019 "  2 shots every 3 or 4 days "  . Drug use: No  . Sexual activity: Not Currently  Lifestyle  . Physical activity    Days per week: Not on file    Minutes per session: Not on file  . Stress: Not on file  Relationships  . Social Musicianconnections    Talks on phone: Not on file    Gets together: Not on file    Attends religious service: Not on file    Active member of club or organization: Not on file    Attends meetings of clubs or organizations: Not on file    Relationship status: Not on file  . Intimate partner violence    Fear of current or ex partner: Not on file    Emotionally abused: Not on file    Physically abused: Not on file    Forced sexual activity: Not on file  Other  Topics Concern  . Not on file  Social History Narrative   Lives at home alone.   Right-handed.   No caffeine use.    Past Surgical History:  Procedure Laterality Date  . WISDOM TOOTH EXTRACTION      Family History  Problem Relation Age of Onset  . Diabetes Father   . Arthritis Father   . Alcohol abuse Father   . Breast cancer Mother   . Asthma Mother   . Drug abuse Mother   . Hypertension Mother     PMHx, SurgHx, SocialHx, FamHx, Medications, and Allergies were reviewed in the Visit Navigator and updated as appropriate.   Patient Active Problem List   Diagnosis Date Noted  . Acute pancreatitis 02/25/2019  . Acute on chronic pancreatitis (HCC) 02/24/2019  . Birth control 05/20/2018  . History of pancreatitis 02/11/2018  . Uncomplicated asthma   . Benign essential HTN   . ETOH abuse   . Tobacco abuse   . Acute blood loss anemia   . Alcohol withdrawal seizure,  uncomplicated ([place])   . Hypotension due to hypovolemia   . Seizures ([place]) 12/19/2015    Social History   Tobacco Use  . Smoking status: Former Smoker    Types: Cigarettes    Quit date: 01/21/2018    Years since quitting: 1.1  . Smokeless tobacco: Never Used  Substance Use Topics  . Alcohol use: Yes    Alcohol/week: 9.0 standard drinks    Types: 9 Shots of liquor per week    Comment: 02/2019 "  2 shots every 3 or 4 days "  . Drug use: No    Current Medications and Allergies:    Current Outpatient Medications:  .  acetaminophen (TYLENOL) 500 MG tablet, Take 1,000 mg by mouth every 6 (six) hours as needed for mild pain., Disp: , Rfl:  .  albuterol (VENTOLIN HFA) 108 (90 Base) MCG/ACT inhaler, Inhale 1-2 puffs into the lungs every 6 (six) hours as needed for wheezing or shortness of breath., Disp: 1 Inhaler, Rfl: 0 .  medroxyPROGESTERone (DEPO-PROVERA) 150 MG/ML injection, Inject 1 mL (150 mg total) into the muscle every 3 (three) months., Disp: 1 mL, Rfl:  .  pantoprazole (PROTONIX) 40 MG tablet, Take 1  tablet (40 mg total) by mouth daily., Disp: 30 tablet, Rfl: 1 .  traMADol (ULTRAM) 50 MG tablet, Take 1 tablet (50 mg total) by mouth every 6 (six) hours as needed., Disp: 20 tablet, Rfl: 0  Allergies  Allergen Reactions  . Claritin [Loratadine] Itching  . Peanut-Containing Drug Products Other (See Comments)  . Benadryl [Diphenhydramine Hcl (Sleep)] Rash  . Latex Rash  . Penicillins Itching and Rash    Has patient had a PCN reaction causing immediate rash, facial/tongue/throat swelling, SOB or lightheadedness with hypotension: Yes Has patient had a PCN reaction causing severe rash involving mucus membranes or skin necrosis: No Has patient had a PCN reaction that required hospitalization: Yes Has patient had a PCN reaction occurring within the last 10 years: No If all of the above answers are "NO", then may proceed with Cephalosporin use.;  . Shrimp [Shellfish Allergy] Hives and Rash    Review of Systems   ROS  Vitals:  There were no vitals filed for this visit.   There is no height or weight on file to calculate BMI.   Physical Exam:    Physical Exam   Assessment and Plan:    There are no diagnoses linked to this encounter.  . Reviewed expectations re: course of current medical issues. . Discussed self-management of symptoms. . Outlined signs and symptoms indicating need for more acute intervention. . Patient verbalized understanding and all questions were answered. . See orders for this visit as documented in the electronic medical record. . Patient received an After Visit Summary.  ***  Inda Coke, PA-C [place] 03/09/2019  Follow-up: No follow-ups on file.

## 2019-03-10 ENCOUNTER — Ambulatory Visit: Payer: Managed Care, Other (non HMO) | Admitting: Physician Assistant

## 2019-03-10 ENCOUNTER — Telehealth: Payer: Self-pay | Admitting: *Deleted

## 2019-03-10 DIAGNOSIS — Z0289 Encounter for other administrative examinations: Secondary | ICD-10-CM

## 2019-03-10 NOTE — Telephone Encounter (Signed)
Left message on voicemail to call office. Missed appt today at 8:00 AM with Donna Short. Needs to reschedule Hospital Follow up.

## 2019-03-11 ENCOUNTER — Encounter: Payer: Self-pay | Admitting: Physician Assistant

## 2019-03-11 ENCOUNTER — Other Ambulatory Visit: Payer: Self-pay

## 2019-03-11 ENCOUNTER — Ambulatory Visit (INDEPENDENT_AMBULATORY_CARE_PROVIDER_SITE_OTHER): Payer: Managed Care, Other (non HMO) | Admitting: Physician Assistant

## 2019-03-11 VITALS — BP 160/110 | HR 117 | Temp 99.6°F | Ht 62.0 in | Wt 126.0 lb

## 2019-03-11 DIAGNOSIS — I10 Essential (primary) hypertension: Secondary | ICD-10-CM

## 2019-03-11 MED ORDER — METOPROLOL TARTRATE 25 MG PO TABS: 25.0000 mg | ORAL_TABLET | Freq: Two times a day (BID) | ORAL | 0 refills | Status: DC

## 2019-03-11 NOTE — Progress Notes (Signed)
Donna Short is a 28 y.o. female is here to for Hospital f/u.  I acted as a Neurosurgeonscribe for Energy East CorporationSamantha Jonica Bickhart, PA-C Corky Mullonna Orphanos, LPN  History of Present Illness:   Chief Complaint  Patient presents with  . Hospitalization Follow-up    HPI   HTN Currently not on any BP medication. After hospitalization in May 2019 she was put on labetalol 200 mg BID. With time, she was weaned off of this and had great BP control. She was recently rehospitalized on 7/2 for pancreatitis, and blood pressures have been elevated ever since. Patient denies chest pain, SOB, blurred vision, dizziness, unusual headaches, lower leg swelling.  Denies excessive caffeine intake, stimulant usage, any alcohol intake, or increase in salt consumption.  BP Readings from Last 3 Encounters:  03/11/19 (!) 160/110  02/27/19 (!) 162/123  02/24/19 (!) 161/122   She states that she has not had any ETOH since she left the hospital.   Health Maintenance Due  Topic Date Due  . TETANUS/TDAP  01/11/2008    Past Medical History:  Diagnosis Date  . Asthma   . History of chicken pox   . Hypertension   . Migraines   . Pancreatitis 01/2018  . Seizures (HCC)      Social History   Socioeconomic History  . Marital status: Single    Spouse name: Not on file  . Number of children: 0  . Years of education: 2 years college  . Highest education level: Not on file  Occupational History  . Occupation: Clinical research associateDeli at [employer]  Social Needs  . Financial resource strain: Not on file  . Food insecurity    Worry: Not on file    Inability: Not on file  . Transportation needs    Medical: Not on file    Non-medical: Not on file  Tobacco Use  . Smoking status: Former Smoker    Types: Cigarettes    Quit date: 01/21/2018    Years since quitting: 1.1  . Smokeless tobacco: Never Used  Substance and Sexual Activity  . Alcohol use: Yes    Alcohol/week: 9.0 standard drinks    Types: 9 Shots of liquor per week    Comment:  02/2019 "  2 shots every 3 or 4 days "  . Drug use: No  . Sexual activity: Not Currently  Lifestyle  . Physical activity    Days per week: Not on file    Minutes per session: Not on file  . Stress: Not on file  Relationships  . Social Musicianconnections    Talks on phone: Not on file    Gets together: Not on file    Attends religious service: Not on file    Active member of club or organization: Not on file    Attends meetings of clubs or organizations: Not on file    Relationship status: Not on file  . Intimate partner violence    Fear of current or ex partner: Not on file    Emotionally abused: Not on file    Physically abused: Not on file    Forced sexual activity: Not on file  Other Topics Concern  . Not on file  Social History Narrative   Lives at home alone.   Right-handed.   No caffeine use.    Past Surgical History:  Procedure Laterality Date  . WISDOM TOOTH EXTRACTION      Family History  Problem Relation Age of Onset  . Diabetes Father   .  Arthritis Father   . Alcohol abuse Father   . Breast cancer Mother   . Asthma Mother   . Drug abuse Mother   . Hypertension Mother     PMHx, SurgHx, SocialHx, FamHx, Medications, and Allergies were reviewed in the Visit Navigator and updated as appropriate.   Patient Active Problem List   Diagnosis Date Noted  . Acute pancreatitis 02/25/2019  . Acute on chronic pancreatitis (HCC) 02/24/2019  . Birth control 05/20/2018  . History of pancreatitis 02/11/2018  . Uncomplicated asthma   . Benign essential HTN   . ETOH abuse   . Tobacco abuse   . Acute blood loss anemia   . Alcohol withdrawal seizure, uncomplicated (HCC)   . Hypotension due to hypovolemia   . Seizures (HCC) 12/19/2015    Social History   Tobacco Use  . Smoking status: Former Smoker    Types: Cigarettes    Quit date: 01/21/2018    Years since quitting: 1.1  . Smokeless tobacco: Never Used  Substance Use Topics  . Alcohol use: Yes    Alcohol/week:  9.0 standard drinks    Types: 9 Shots of liquor per week    Comment: 02/2019 "  2 shots every 3 or 4 days "  . Drug use: No    Current Medications and Allergies:    Current Outpatient Medications:  .  acetaminophen (TYLENOL) 500 MG tablet, Take 1,000 mg by mouth every 6 (six) hours as needed for mild pain., Disp: , Rfl:  .  albuterol (VENTOLIN HFA) 108 (90 Base) MCG/ACT inhaler, Inhale 1-2 puffs into the lungs every 6 (six) hours as needed for wheezing or shortness of breath., Disp: 1 Inhaler, Rfl: 0 .  medroxyPROGESTERone (DEPO-PROVERA) 150 MG/ML injection, Inject 1 mL (150 mg total) into the muscle every 3 (three) months., Disp: 1 mL, Rfl:  .  pantoprazole (PROTONIX) 40 MG tablet, Take 1 tablet (40 mg total) by mouth daily., Disp: 30 tablet, Rfl: 1 .  traMADol (ULTRAM) 50 MG tablet, Take 1 tablet (50 mg total) by mouth every 6 (six) hours as needed., Disp: 20 tablet, Rfl: 0 .  metoprolol tartrate (LOPRESSOR) 25 MG tablet, Take 1 tablet (25 mg total) by mouth 2 (two) times daily., Disp: 60 tablet, Rfl: 0   Allergies  Allergen Reactions  . Claritin [Loratadine] Itching  . Peanut-Containing Drug Products Other (See Comments)  . Benadryl [Diphenhydramine Hcl (Sleep)] Rash  . Latex Rash  . Penicillins Itching and Rash    Has patient had a PCN reaction causing immediate rash, facial/tongue/throat swelling, SOB or lightheadedness with hypotension: Yes Has patient had a PCN reaction causing severe rash involving mucus membranes or skin necrosis: No Has patient had a PCN reaction that required hospitalization: Yes Has patient had a PCN reaction occurring within the last 10 years: No If all of the above answers are "NO", then may proceed with Cephalosporin use.;  . Shrimp [Shellfish Allergy] Hives and Rash    Review of Systems   ROS  Negative unless otherwise specified per HPI.   Vitals:   Vitals:   03/11/19 1504 03/11/19 1526  BP: (!) 154/100 (!) 160/110  Pulse: (!) 111 (!) 117   Temp: 99.6 F (37.6 C)   TempSrc: Oral   SpO2: 100%   Weight: 126 lb (57.2 kg)   Height: 5\' 2"  (1.575 m)      Body mass index is 23.05 kg/m.   Physical Exam:    Physical Exam Vitals signs and nursing note  reviewed.  Constitutional:      General: She is not in acute distress.    Appearance: She is well-developed. She is not ill-appearing or toxic-appearing.  Cardiovascular:     Rate and Rhythm: Regular rhythm. Tachycardia present.     Pulses: Normal pulses.     Heart sounds: Normal heart sounds, S1 normal and S2 normal.     Comments: No LE edema Pulmonary:     Effort: Pulmonary effort is normal.     Breath sounds: Normal breath sounds.  Skin:    General: Skin is warm and dry.  Neurological:     Mental Status: She is alert.     GCS: GCS eye subscore is 4. GCS verbal subscore is 5. GCS motor subscore is 6.  Psychiatric:        Speech: Speech normal.        Behavior: Behavior normal. Behavior is cooperative.      Assessment and Plan:    Azilee was seen today for hospitalization follow-up.  Diagnoses and all orders for this visit:  Essential hypertension  Other orders -     metoprolol tartrate (LOPRESSOR) 25 MG tablet; Take 1 tablet (25 mg total) by mouth 2 (two) times daily.   Uncontrolled. Will trial metoprolol 25 mg BID for two weeks, and then follow-up with me. Worsening precautions advised. Continue to abstain from ETOH.  . Reviewed expectations re: course of current medical issues. . Discussed self-management of symptoms. . Outlined signs and symptoms indicating need for more acute intervention. . Patient verbalized understanding and all questions were answered. . See orders for this visit as documented in the electronic medical record. . Patient received an After Visit Summary.  Inda Coke, PA-C [place], [place] 03/11/2019  Follow-up: No follow-ups on file.

## 2019-03-11 NOTE — Patient Instructions (Signed)
It was great to see you!  Follow-up with me in 2 weeks, sooner if you have concerns.  Start the blood pressure medication today.  If you develop any severe headache, chest pain, shortness of breath or any other concerning symptoms, please go to the ER.  Take care,  Inda Coke PA-C

## 2019-03-25 ENCOUNTER — Ambulatory Visit: Payer: Managed Care, Other (non HMO) | Admitting: Physician Assistant

## 2019-04-01 ENCOUNTER — Encounter: Payer: Self-pay | Admitting: Physician Assistant

## 2019-06-24 ENCOUNTER — Ambulatory Visit: Payer: Managed Care, Other (non HMO) | Admitting: Family Medicine

## 2019-07-31 IMAGING — DX DG CHEST 1V PORT SAME DAY
1 series · 1 of 1 positions shown · non-contrast
Comparison: 01/17/2018 at [DATE] a.m.

CLINICAL DATA: Displacement of central venous catheter.

EXAM:
PORTABLE CHEST 1 VIEW

[chest ap]
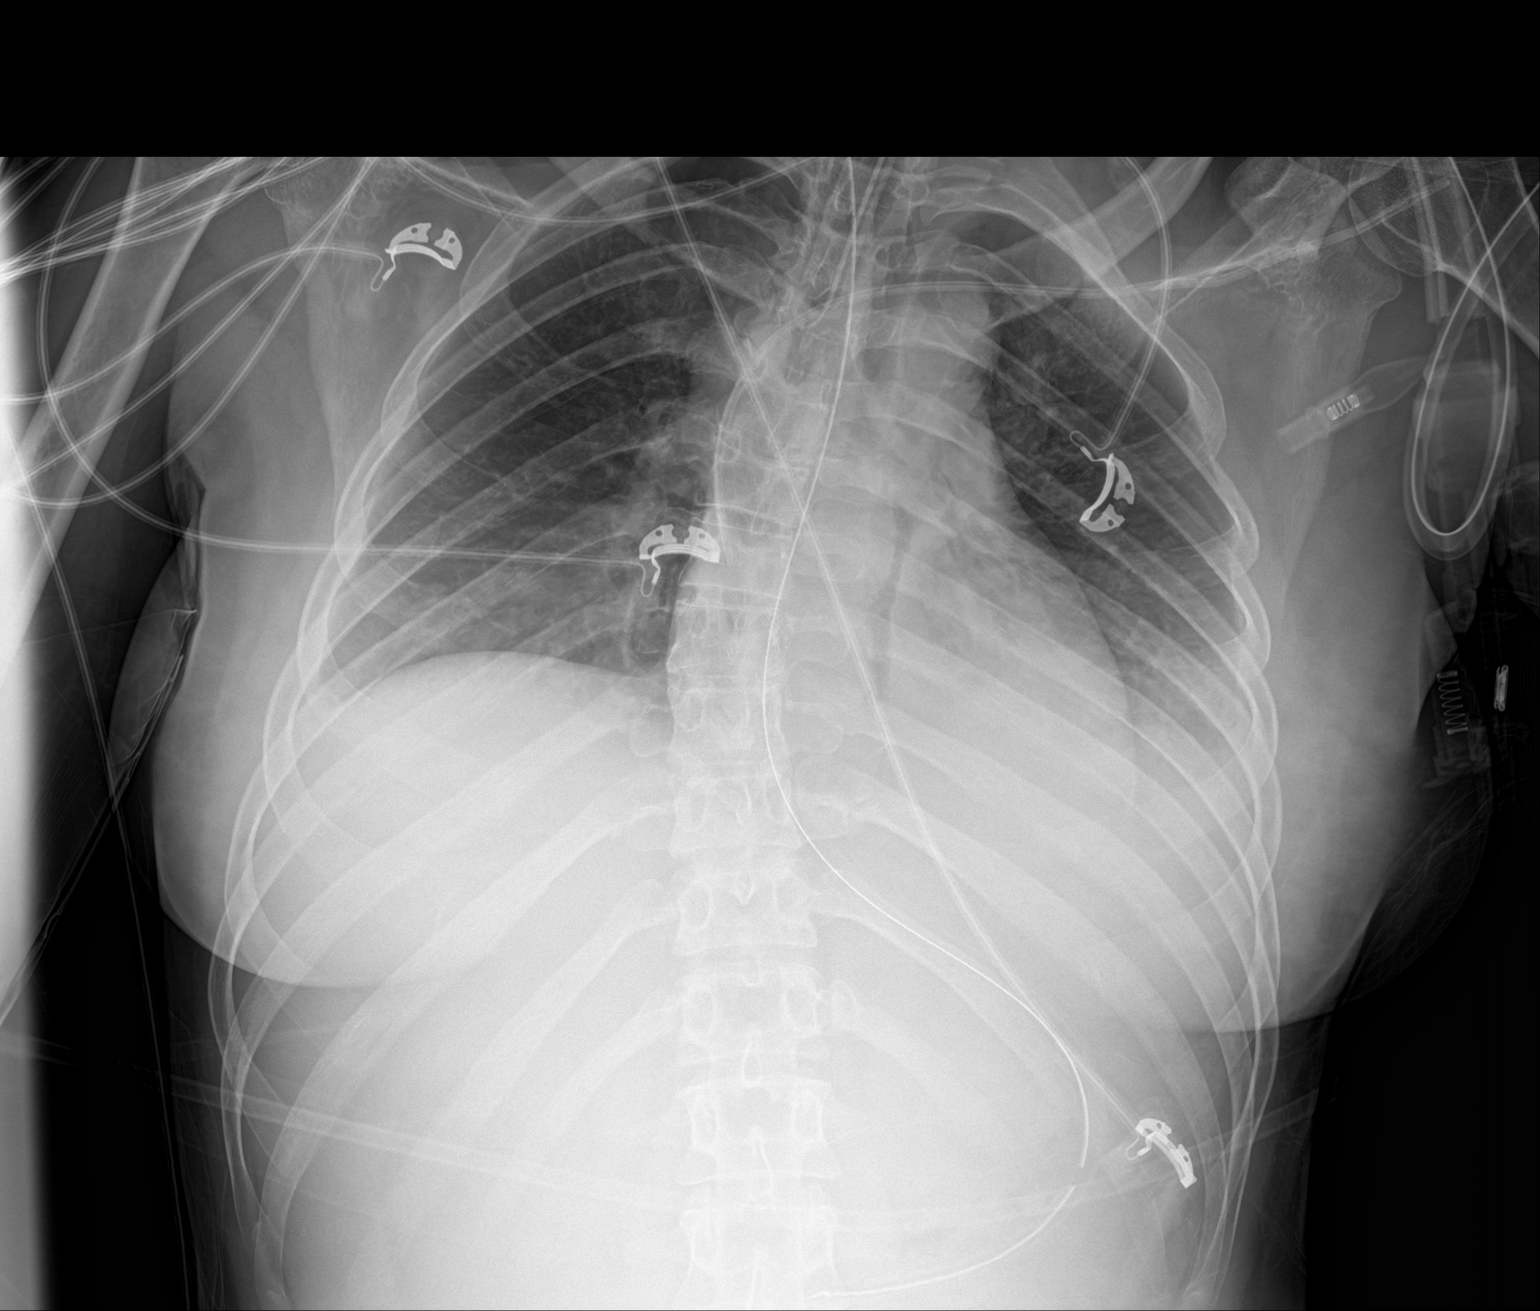

[1 of 1 positions shown; findings below may reference images not displayed]

FINDINGS: Since prior exam, a left subclavian central venous line has been
placed. The tip projects at the caval atrial junction. No
pneumothorax.

The endotracheal tube and the nasal/orogastric tube are stable and
well positioned.

There is new left lung base opacity mostly silhouetting the left
hemidiaphragm. This may reflect atelectasis or a rapidly evolving
infection. Atelectasis is favored.
IMPRESSION: 1. New left subclavian central venous line is well positioned, tip
at the caval atrial junction. No pneumothorax.
2. New left lung base opacity, most likely atelectasis. Consider
pneumonia if there are consistent clinical findings.

## 2019-08-01 IMAGING — CT CT HEAD W/O CM
4 series · 15 of 47 positions shown, 17 images · non-contrast
Comparison: CT scan of December 07, 2015.

CLINICAL DATA: Altered mental status.

EXAM:
CT HEAD WITHOUT CONTRAST
TECHNIQUE: Contiguous axial images were obtained from the base of the skull
through the vertex without intravenous contrast.

[Series 3: head without · axial · non-contrast · 0.43mm/px · z∈[-146,-26]mm · 7 of 32 slices shown, 9 images]
[im 4/32  brain]
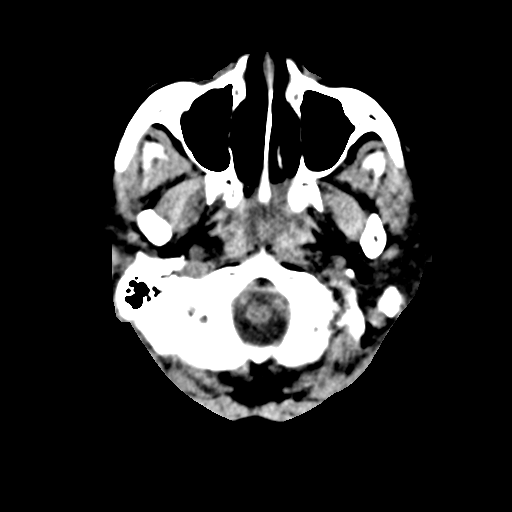
[im 4/32  bone]
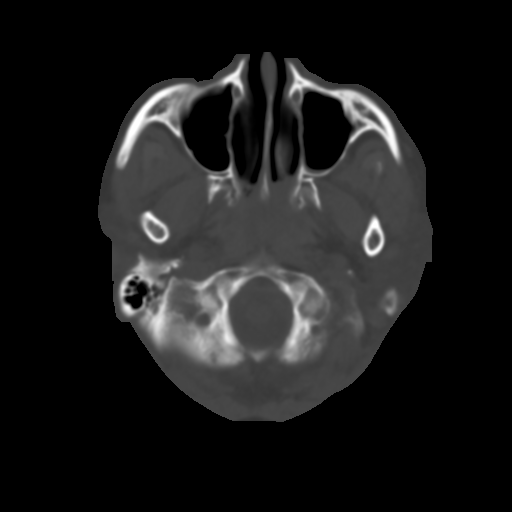
[im 8/32  brain]
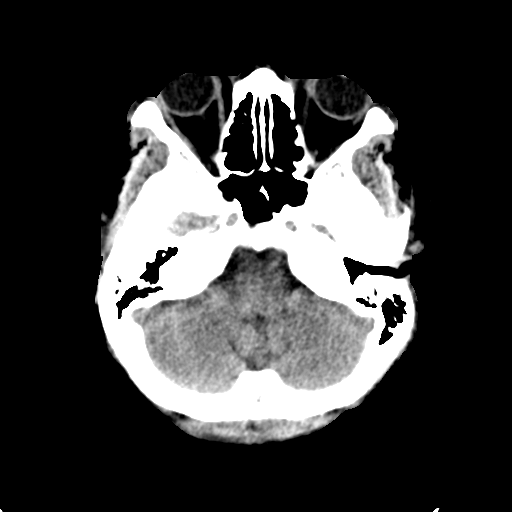
[im 12/32  brain]
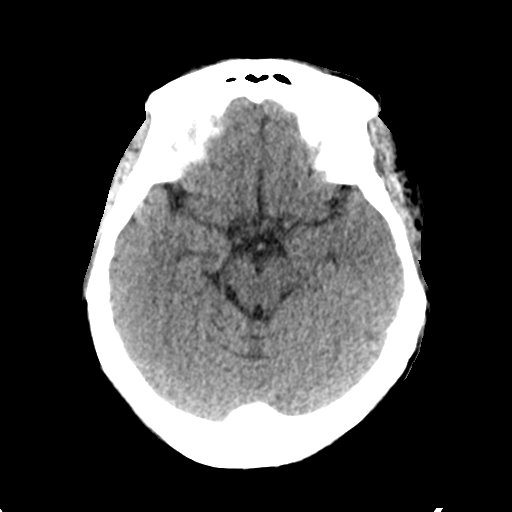
[im 16/32  brain]
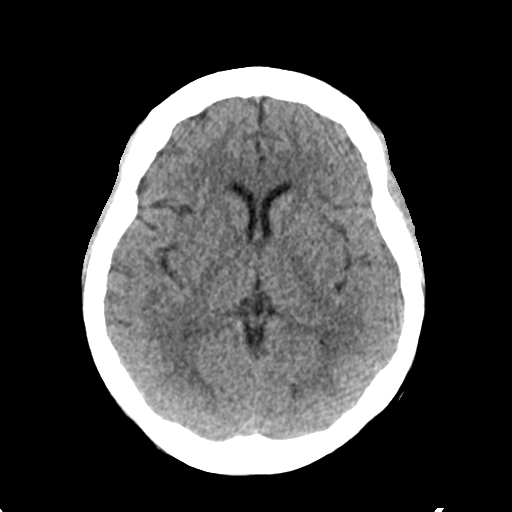
[im 20/32  brain]
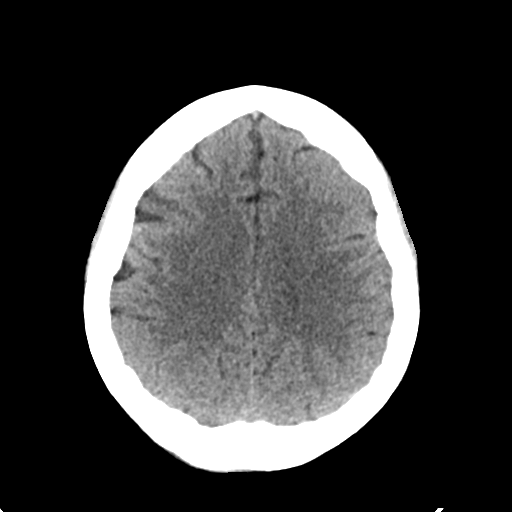
[im 20/32  bone]
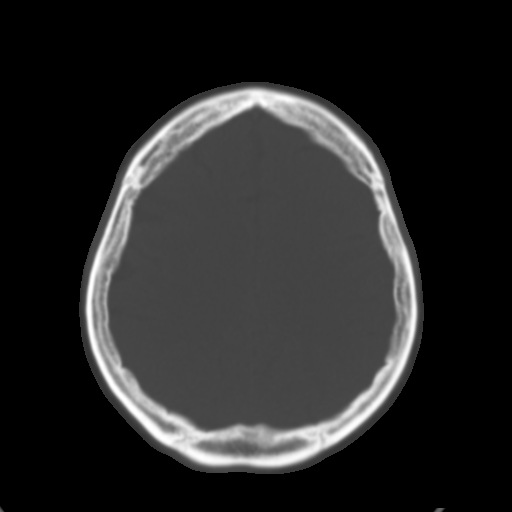
[im 24/32  brain]
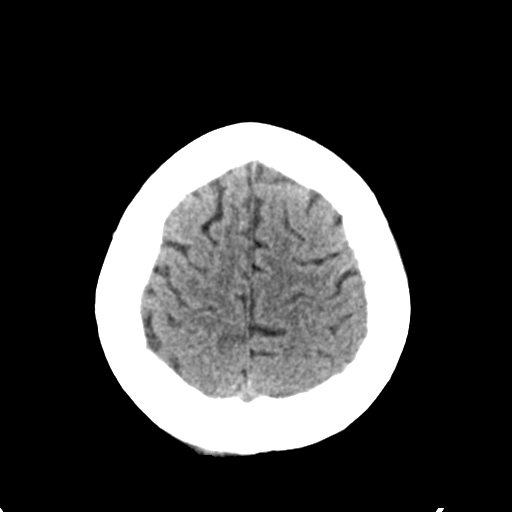
[im 28/32  brain]
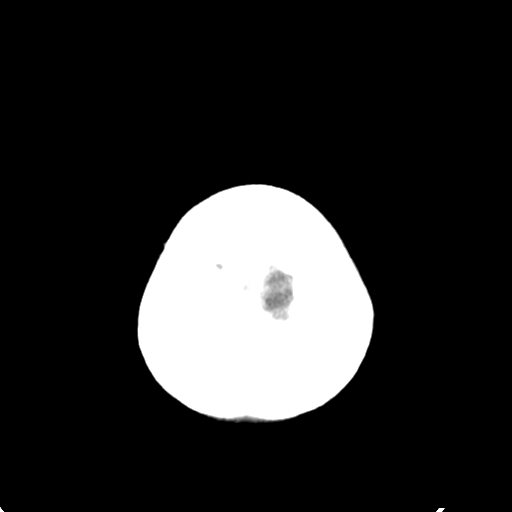

[Series 4: head bone · axial · 0.43mm/px · z∈[-147,-131]mm · 2 of 80 slices shown]
[im 8/80  bone]
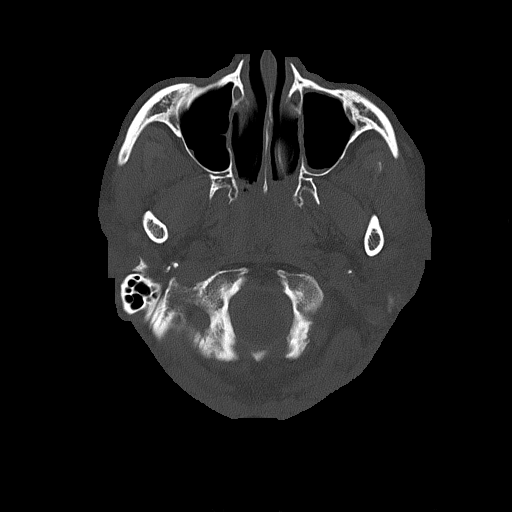
[im 16/80  bone]
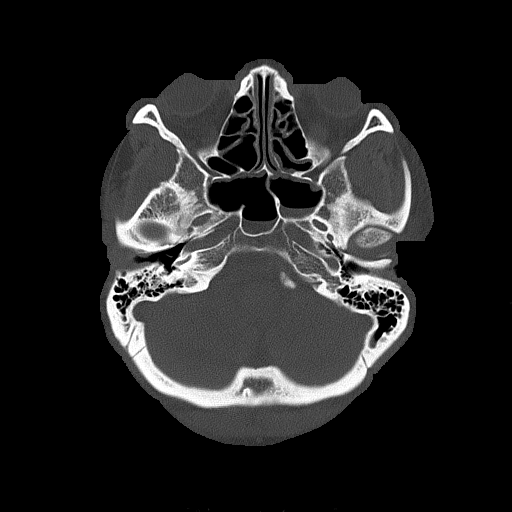

[Series 5: head without cor · coronal · non-contrast · 0.31mm/px · 3 of 67 slices shown]
[im 23/67  brain]
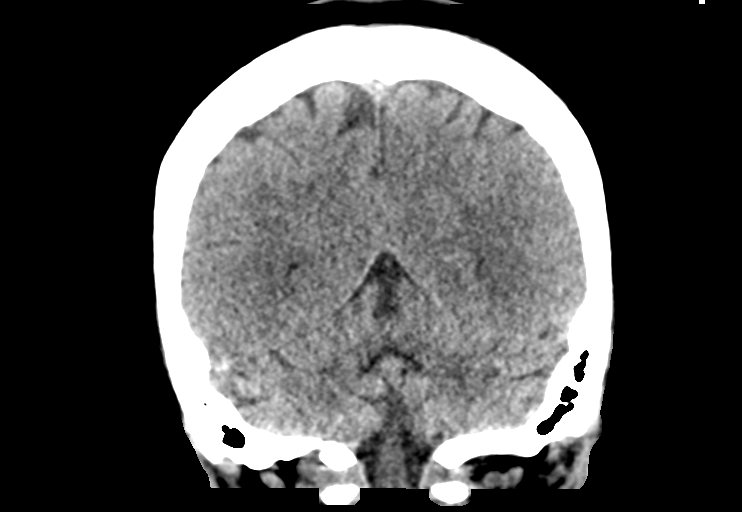
[im 30/67  brain]
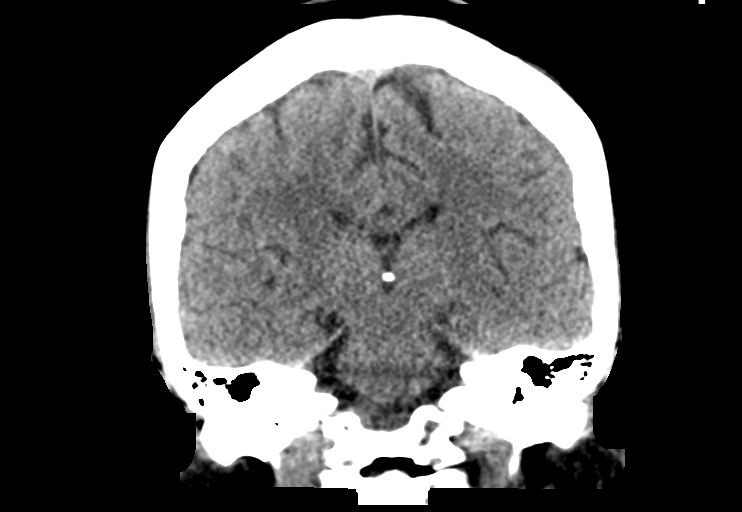
[im 37/67  brain]
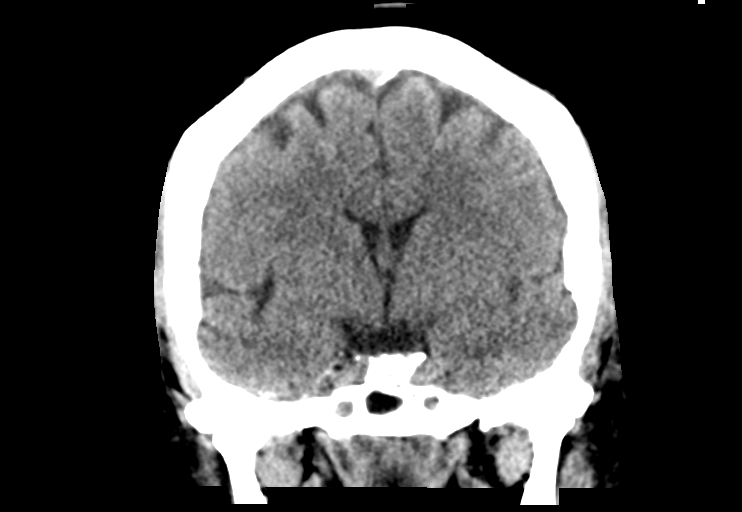

[Series 6: head without sag · sagittal · non-contrast · 0.31mm/px · 3 of 67 slices shown]
[im 23/67  brain]
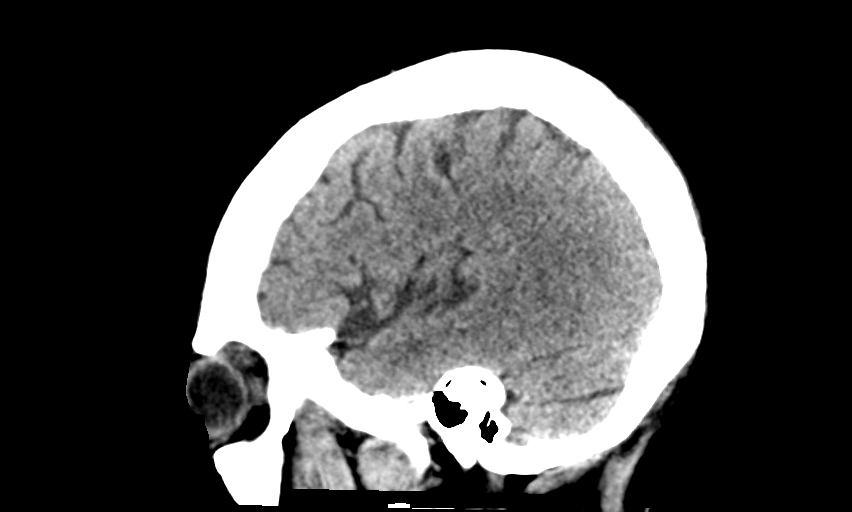
[im 34/67  brain]
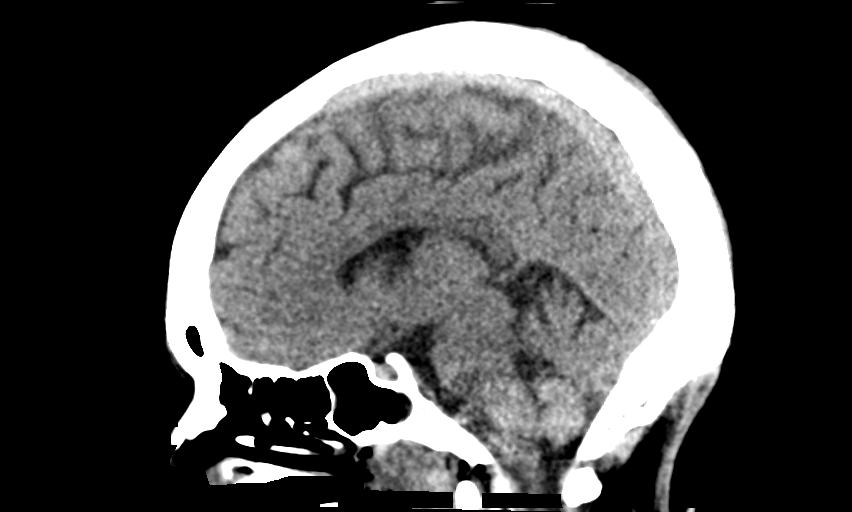
[im 45/67  brain]
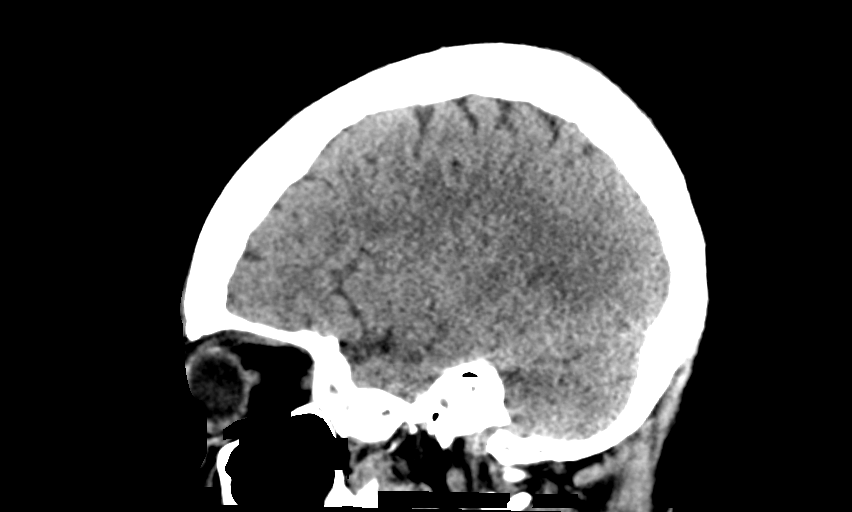

[15 of 47 positions shown; findings below may reference images not displayed]

FINDINGS: Brain: No evidence of acute infarction, hemorrhage, hydrocephalus,
extra-axial collection or mass lesion/mass effect.

Vascular: No hyperdense vessel or unexpected calcification.

Skull: Normal. Negative for fracture or focal lesion.

Sinuses/Orbits: Probable mild sphenoid sinusitis.

Other: None.
IMPRESSION: Normal head CT.

## 2019-08-11 ENCOUNTER — Ambulatory Visit: Payer: Self-pay | Admitting: *Deleted

## 2019-08-11 ENCOUNTER — Telehealth: Payer: Self-pay | Admitting: Neurology

## 2019-08-11 MED ORDER — LEVETIRACETAM 500 MG PO TABS: ORAL_TABLET | ORAL | 3 refills | Status: DC

## 2019-08-11 NOTE — Telephone Encounter (Signed)
Patient calls reporting she had a seizure while at work around 1:00p today. EMS came and evaluated her and said she did not need to go to the ED but to follow up with PCP.Patient is alert and oriented at this time with normal speech.  Stated she has a history of seizures and was on medication at one time but not now. She phoned her neurologist who was to call her back today with a prescription but has not as of yet. Reports she hit the floor with her head but no knot/indention/laceration observed by EMS. Tongue is sore from being bit. Reports headache and tired tonight. Denies vomiting.Her mother will be staying with her tonight. Advised having her wake you every 2 hours, monitor for confusion/severe headache/seizure/fever. Routing to PCP for appointment tomorrow. Reviewed sxs requiring immediate evaluation.    Reason for Disposition . Stopped taking seizure medicine (anticonvulsant)  Answer Assessment - Initial Assessment Questions 1. ONSET: "How long did the seizure last?" (Minutes)      2. CONTENT: "Describe what happened during the seizure. Did the body become stiff? Was there any jerking?"     Started jerking and fell backwards onto the floor while at work. 3. CIRCUMSTANCE: "What was the individual doing when the seizure began?"      Working at [employer]. 4. MENTAL STATUS: "Does he know who he is, who you are, and where he is?"      yes 5. PRIOR SEIZURES: "Has the individual had a seizure (convulsion) before?" If so, ask: "When was the last time?" and "What happened last time?"    Yes, over a year ago, 2019 6. EPILEPSY: "Does the individual have epilepsy?" (note: check for medical ID bracelet)     no 7. MEDICATIONS: "Does the individual take anticonvulsant medications?" (e.g., yes/no, compliance, any recent changes)     Not recently. 8. INJURY: "Did the individual hurt himself during the seizure?" (e.g., head, tongue)    Hit the head on the floor, bit tongue 9. OTHER SYMPTOMS: "Are  there any other symptoms?" (e.g., fever, headache)     Headache, tired 10. PREGNANCY: "Is there any chance you are pregnant?" "When was your last menstrual period?"      na  Protocols used: [facility]

## 2019-08-11 NOTE — Telephone Encounter (Signed)
Pt states she has been sent home from work as a result of a slight seizure.  Pt advised not to return to work tomorrow.  Pt states ambulance tech suggested she not got to ED due to high Covid numbers.  Pt asking for a call to discuss what Dr Krista Blue would suggest

## 2019-08-11 NOTE — Telephone Encounter (Signed)
I returned the call to the patient.  States she had a seizure today at work.  She was last seen in our office on 02/11/18 and reported taking Keppra 500mg  BID.  She was instructed to continue the medication.  She called back on 11/09/2018 and reported she had not been taking Keppra since May 2019.  She was advised to restart the medication and was changed to XR due to side effects.  Today, she states she never started the Keppra XR.  She has not been on medication since 12/2017.  She has not been seen here since 01/2018.  She is unable to make an appt due to her high co-pay and says she cannot follow up here at this time.  Per vo by Dr. Krista Blue, she will provide her with three months of Keppra 500mg  BID then her PCP will need to take over management of the prescription.  She is in agreement to this plan.  She verbalized understanding that [place] law prohibits driving until six month episode free.  She should also avoid heights and any other activities that would pose a safety risk if a seizure occurs. She is going to contact her PCP tomorrow to discuss her medical care.  All future refills of Keppra will need to be addressed by her PCP unless she schedules an appt at our office.

## 2019-08-11 NOTE — Addendum Note (Signed)
Addended by: Desmond Lope on: 08/11/2019 05:32 PM   Modules accepted: Orders

## 2019-08-12 ENCOUNTER — Other Ambulatory Visit: Payer: Self-pay | Admitting: Family Medicine

## 2019-08-12 ENCOUNTER — Telehealth: Payer: Self-pay | Admitting: Family Medicine

## 2019-08-12 ENCOUNTER — Encounter: Payer: Self-pay | Admitting: Family Medicine

## 2019-08-12 NOTE — Telephone Encounter (Signed)
Donna Short, Can you call her and see how she is doing? I would advise she take ibuprofen from her seizure to help with the pain and have close f/u with neurology.  Thanks,  Dr. Rogers Blocker

## 2019-08-12 NOTE — Telephone Encounter (Signed)
ERROR

## 2019-08-12 NOTE — Telephone Encounter (Signed)
Called and spoke with pt and pt states her job will be faxing over a form for clarification on when she needs to be out of work. I advised pt that once we receive this form we will get it to Dr. Rogers Blocker.

## 2019-08-12 NOTE — Telephone Encounter (Signed)
Called and spoke with pt and she states Dr. Krista Blue office told her to call here and get her note from her PCP b/c Dr. Greer Pickerel office is closed today and pt is needing the note for her job to know if it is safe for her to return to work tomorrow. Dr. Krista Blue is not able to see pt until a month from now and pt states that she can't financially afford to see Dr. Krista Blue however, seeing Dr. Rogers Blocker is cheaper for her. She is just needing to know wethere it is or isn't safe for her to work tomorrow.

## 2019-08-12 NOTE — Telephone Encounter (Signed)
Called and spoke with pt and pt is still having pain from her seizures and is requesting a work note due to her having seizures.

## 2019-08-12 NOTE — Telephone Encounter (Signed)
Called patient after speaking with Dr. Rogers Blocker. Dr. Rogers Blocker wanted patient to follow up with Dr. Krista Blue her neurologist. Patient stated that they are giving her a medicine for her seizure however, their office is closed today and she has pains in her body from the seizure. Clinical team, reach out to patient to follow up today regarding pains.

## 2019-08-12 NOTE — Telephone Encounter (Signed)
Pt called back.  Note needs to be faxed to [phone] Attn:  Luanna Cole or Fara Olden.  Pt also has additional questions for [place] and can be reached at [phone].

## 2019-08-12 NOTE — Telephone Encounter (Signed)
Pt called to see if work form has been received.

## 2019-08-12 NOTE — Telephone Encounter (Signed)
Pt.notified

## 2019-08-12 NOTE — Telephone Encounter (Signed)
See note

## 2019-08-12 NOTE — Telephone Encounter (Signed)
Note faxed to employer and pt notified. FMLA appointment scheduled also.

## 2019-08-12 NOTE — Telephone Encounter (Signed)
Please advise.  Copied from [place] [id number]. Topic: General - Inquiry >> Aug 12, 2019  1:15 PM [name] wrote: Reason for CRM:   Pt calling to speak with nurse re: triage message from yesterday.  Pt continues to have seizures and needs to know what to do.  Pt states she was told by office staff yesterday that she would not be able to see her PCP until she was evaluated by neurology who can't see her for another month.  Work is requiring documentation of what is going on because this continues to happen and needs to know if there are any safety concerns with pt working. Per [facility] send CRM high priority.

## 2019-08-12 NOTE — Telephone Encounter (Signed)
Please call for app today with [facility]

## 2019-08-12 NOTE — Telephone Encounter (Signed)
I gave her off work until Monday, but she has to start taking her seizure medication that the neurologist sent in for her. If anymore seizure this weekend I do not want her working on Monday either and she can not drive per [place] laws as discussed by neurology with her.   Orma Flaming, MD [place]

## 2019-08-12 NOTE — Telephone Encounter (Signed)
Note done for tomorrow. I will need to see her to do fmla paperwork.  Orma Flaming, MD [place]

## 2019-08-15 NOTE — Telephone Encounter (Signed)
Our office received short-term disability paperwork from [place].  She was going to follow up with her PCP last week.  She was informed at that time, if paperwork was required by her employer, it would need to be discussed with her PCP.  Donna Short has been notified.

## 2019-08-23 ENCOUNTER — Telehealth: Payer: Self-pay

## 2019-08-23 NOTE — Telephone Encounter (Signed)
Please see message below and advise.  Attempted to get patient in sooner for FMLA ppw but she cannot come in.  She states that her manager is needing note stating that she cannot lift over 25 lbs.  States that she fell at work a few days ago and now her manager is wanting her lift boxes that are 65 lbs.

## 2019-08-23 NOTE — Telephone Encounter (Signed)
Copied from [place] [id number]. Topic: General - Inquiry >> Aug 22, 2019  5:03 PM Percell Belt A wrote: Reason for CRM: pt called in stated that she would like to see if Dr Rogers Blocker could write a note that she can not lift over 25 pounds due to her back issues.  Please advise  Best number [phone]

## 2019-08-24 NOTE — Telephone Encounter (Signed)
Please clarify this for me...  1) I have no idea what is wrong with her back. She fell because she had a seizure. If she hurt her back, then she needs to be seen for an appointment to make sure  nothing is wrong. I don't feel like I can just write a note without seeing her for back pain as this is not safe for her or me and we need to do what is best for her.   2) if we have an acute tomorrow we can put her in for this.   Orma Flaming, MD [place]

## 2019-08-24 NOTE — Telephone Encounter (Signed)
Spoke to patient.  She is having back pain since her fall last week after having a seizure.  I advised her that we need to see her so that she can be examined to make sure no acute injury.  Patient verbalized understanding and appointment made for 12/31 @ 10:40.

## 2019-08-24 NOTE — Telephone Encounter (Signed)
Patient called to speak to Donna Short she is asking for a call back by 11 AM please because she is due at work at 1 PM but can not go without a note. She is also wanting to come in for the visit about her FMLA paper work still. Please call Ph#  [phone]

## 2019-08-24 NOTE — Telephone Encounter (Signed)
See note

## 2019-08-25 ENCOUNTER — Other Ambulatory Visit: Payer: Self-pay

## 2019-08-25 ENCOUNTER — Ambulatory Visit (INDEPENDENT_AMBULATORY_CARE_PROVIDER_SITE_OTHER): Payer: Managed Care, Other (non HMO) | Admitting: Family Medicine

## 2019-08-25 ENCOUNTER — Encounter: Payer: Self-pay | Admitting: Family Medicine

## 2019-08-25 VITALS — BP 124/92 | HR 136 | Temp 98.1°F | Ht 62.0 in | Wt 118.8 lb

## 2019-08-25 DIAGNOSIS — M545 Low back pain, unspecified: Secondary | ICD-10-CM

## 2019-08-25 DIAGNOSIS — I10 Essential (primary) hypertension: Secondary | ICD-10-CM

## 2019-08-25 DIAGNOSIS — R569 Unspecified convulsions: Secondary | ICD-10-CM | POA: Diagnosis not present

## 2019-08-25 LAB — CBC WITH DIFFERENTIAL/PLATELET
Basophils Absolute: 0.1 10*3/uL (ref 0.0–0.1)
Basophils Relative: 0.6 % (ref 0.0–3.0)
Eosinophils Absolute: 0 10*3/uL (ref 0.0–0.7)
Eosinophils Relative: 0.4 % (ref 0.0–5.0)
HCT: 41.7 % (ref 36.0–46.0)
Hemoglobin: 13.9 g/dL (ref 12.0–15.0)
Lymphocytes Relative: 19.1 % (ref 12.0–46.0)
Lymphs Abs: 1.6 10*3/uL (ref 0.7–4.0)
MCHC: 33.4 g/dL (ref 30.0–36.0)
MCV: 106.1 fl — ABNORMAL HIGH (ref 78.0–100.0)
Monocytes Absolute: 0.6 10*3/uL (ref 0.1–1.0)
Monocytes Relative: 7.1 % (ref 3.0–12.0)
Neutro Abs: 6.2 10*3/uL (ref 1.4–7.7)
Neutrophils Relative %: 72.8 % (ref 43.0–77.0)
Platelets: 190 10*3/uL (ref 150.0–400.0)
RBC: 3.93 Mil/uL (ref 3.87–5.11)
RDW: 13.7 % (ref 11.5–15.5)
WBC: 8.6 10*3/uL (ref 4.0–10.5)

## 2019-08-25 LAB — COMPREHENSIVE METABOLIC PANEL
ALT: 38 U/L — ABNORMAL HIGH (ref 0–35)
AST: 80 U/L — ABNORMAL HIGH (ref 0–37)
Albumin: 4.5 g/dL (ref 3.5–5.2)
Alkaline Phosphatase: 46 U/L (ref 39–117)
BUN: 16 mg/dL (ref 6–23)
CO2: 19 mEq/L (ref 19–32)
Calcium: 9.5 mg/dL (ref 8.4–10.5)
Chloride: 97 mEq/L (ref 96–112)
Creatinine, Ser: 0.74 mg/dL (ref 0.40–1.20)
GFR: 111.04 mL/min (ref >60.00)
Glucose, Bld: 69 mg/dL — ABNORMAL LOW (ref 70–99)
Potassium: 4.2 mEq/L (ref 3.5–5.1)
Sodium: 138 mEq/L (ref 135–145)
Total Bilirubin: 0.5 mg/dL (ref 0.2–1.2)
Total Protein: 8 g/dL (ref 6.0–8.3)

## 2019-08-25 LAB — MAGNESIUM: Magnesium: 1.3 mg/dL — ABNORMAL LOW (ref 1.5–2.5)

## 2019-08-25 LAB — CK: Total CK: 68 U/L (ref 7–177)

## 2019-08-25 MED ORDER — METOPROLOL TARTRATE 25 MG PO TABS: 25.0000 mg | ORAL_TABLET | Freq: Two times a day (BID) | ORAL | 1 refills | Status: DC

## 2019-08-25 MED ORDER — IBUPROFEN 600 MG PO TABS: 600.0000 mg | ORAL_TABLET | Freq: Three times a day (TID) | ORAL | 0 refills | Status: DC | PRN

## 2019-08-25 MED ORDER — BACLOFEN 10 MG PO TABS: 10.0000 mg | ORAL_TABLET | Freq: Three times a day (TID) | ORAL | 0 refills | Status: DC

## 2019-08-25 NOTE — Progress Notes (Signed)
Patient: Donna Short MRN: [id number] DOB: 07-11-1991 PCP: Orma Flaming, MD     Subjective:  Chief Complaint  Patient presents with  . Back Pain    HPI: The patient is a 28 y.o. female who presents today for lower back pain.  S/P fall on 12/24. She had a seizure for about 10 minutes per EMS. She has had back pain since that time. Pain is in lower back on both sides. Pain is worse with bending over. Pain is constant, but worse when she bends over. Pain rated as an 5/10, but 10/10 when she bends over. Her pain has not gotten any better. She has taken tylenol and it does help some. She has not tried ibuprofen. Pain described as stabbing and does not radiate. She denies any numbness, tingling, weakness in her legs. No urinary incontinence. She has started back on her keppra and has not had a seizure since that time.   HTN/tachycardia: was put on a beta blocker, but has not been taking this.   Seizure: had a seizure on 12/24. Had been off her keppra for some time with no follow up with neurology. Has called neuro, they gave her one month of keppra and discussed I have to refill. She does have f/u with them in February.   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   Review of Systems  Respiratory: Negative for shortness of breath.   Cardiovascular: Negative for chest pain and palpitations.  Gastrointestinal: Negative for abdominal pain and nausea.  Musculoskeletal: Positive for back pain and neck stiffness. Negative for neck pain.       C/o lower back pain x 1 week.  S/P fall on 12/24. Rates pain as a 10/10 at its worst.  Today pain is an 8/10.  Lifting, bending causes the pain to be much worse  Neurological: Negative for dizziness and headaches.    Allergies Patient is allergic to claritin [loratadine]; peanut-containing drug products; benadryl [diphenhydramine hcl (sleep)]; latex; penicillins; and shrimp [shellfish allergy].  Past Medical History Patient  has a past medical  history of Asthma, History of chicken pox, Hypertension, Migraines, Pancreatitis (01/2018), and Seizures ([facility] [place]).  Surgical History Patient  has a past surgical history that includes Wisdom tooth extraction.  Family History Pateint's family history includes Alcohol abuse in her father; Arthritis in her father; Asthma in her mother; Breast cancer in her mother; Diabetes in her father; Drug abuse in her mother; Hypertension in her mother.  Social History Patient  reports that she quit smoking about 19 months ago. Her smoking use included cigarettes. She has never used smokeless tobacco. She reports current alcohol use of about 9.0 standard drinks of alcohol per week. She reports that she does not use drugs.    Objective: Vitals:   08/25/19 1053  BP: (!) 124/92  Pulse: (!) 136  Temp: 98.1 F (36.7 C)  TempSrc: Skin  SpO2: 97%  Weight: 118 lb 12.8 oz (53.9 kg)  Height: 5\' 2"  (1.575 m)    Body mass index is 21.73 kg/m.  Physical Exam Vitals reviewed.  Constitutional:      Appearance: Normal appearance.  HENT:     Head: Normocephalic and atraumatic.  Cardiovascular:     Rate and Rhythm: Regular rhythm. Tachycardia present.     Heart sounds: Normal heart sounds.  Pulmonary:     Effort: Pulmonary effort is normal.     Breath sounds: Normal breath sounds.  Abdominal:     General: Abdomen is flat.  Bowel sounds are normal.     Palpations: Abdomen is soft.  Musculoskeletal:        General: Tenderness present. No swelling. Normal range of motion.     Cervical back: Normal range of motion and neck supple.     Comments: She has TTP over her bilateral SI joints and along her lumbar paraspinal muscles and spinous processes.  DTR 2+ in her knees, sensation intact. Gait normal  Straight leg tests equivocal bilaterally. Pain seems to be more in her SI joints.   Neurological:     General: No focal deficit present.     Mental Status: She is alert and oriented to person, place, and time.         Assessment/plan: 1. Benign essential HTN Starting her back on medication, mainly for her tachycardia. Discussed she needs to be seen routinely and needs to call when she needs refills. Checking basic labs today as well.   2. Seizures ([facility]) Back on keppra. Neurology requested I fill this and has f/u with them in February. No seizures since back on medication. Labs today.   3. Acute bilateral low back pain without sciatica No red flags on exam. Likely muscle spasm/fatigue from seizures. Checking labs for CK/mg/cbc/cmp. Push fluids, exercises given, muscle relaxer and ibuprofen prn. Discussed if not better in 2 weeks or worsening symptoms we will need to image. Note written for work to not lift more than 15 pounds over next 2-3 weeks and to wear a back brace. Any fever/foot drop/radicular symptoms, weakness, urinary incontinence, saddle paraesthesias: er.    This visit occurred during the SARS-CoV-2 public health emergency.  Safety protocols were in place, including screening questions prior to the visit, additional usage of staff PPE, and extensive cleaning of exam room while observing appropriate contact time as indicated for disinfecting solutions.     Return if symptoms worsen or fail to improve.   Orland Mustard, MD [facility]   08/25/2019

## 2019-08-25 NOTE — Patient Instructions (Signed)
1) sending in your blood pressure medication that you take twice a day. Also controls your heart rate.  2) sending in muscle relaxer called baclofen. Take it up to three times a day as needed 3) ibuprofen up to three times a day for pain. Take with water and food.   -do stretches and try heating pad. If not getting better in 2 weeks need to do imaging so let me know.      Acute Back Pain, Adult Acute back pain is sudden and usually short-lived. It is often caused by an injury to the muscles and tissues in the back. The injury may result from:  A muscle or ligament getting overstretched or torn (strained). Ligaments are tissues that connect bones to each other. Lifting something improperly can cause a back strain.  Wear and tear (degeneration) of the spinal disks. Spinal disks are circular tissue that provides cushioning between the bones of the spine (vertebrae).  Twisting motions, such as while playing sports or doing yard work.  A hit to the back.  Arthritis. You may have a physical exam, lab tests, and imaging tests to find the cause of your pain. Acute back pain usually goes away with rest and home care. Follow these instructions at home: Managing pain, stiffness, and swelling  Take over-the-counter and prescription medicines only as told by your health care provider.  Your health care provider may recommend applying ice during the first 24-48 hours after your pain starts. To do this: ? Put ice in a plastic bag. ? Place a towel between your skin and the bag. ? Leave the ice on for 20 minutes, 2-3 times a day.  If directed, apply heat to the affected area as often as told by your health care provider. Use the heat source that your health care provider recommends, such as a moist heat pack or a heating pad. ? Place a towel between your skin and the heat source. ? Leave the heat on for 20-30 minutes. ? Remove the heat if your skin turns bright red. This is especially important if  you are unable to feel pain, heat, or cold. You have a greater risk of getting burned. Activity   Do not stay in bed. Staying in bed for more than 1-2 days can delay your recovery.  Sit up and stand up straight. Avoid leaning forward when you sit, or hunching over when you stand. ? If you work at a desk, sit close to it so you do not need to lean over. Keep your chin tucked in. Keep your neck drawn back, and keep your elbows bent at a right angle. Your arms should look like the letter "L." ? Sit high and close to the steering wheel when you drive. Add lower back (lumbar) support to your car seat, if needed.  Take short walks on even surfaces as soon as you are able. Try to increase the length of time you walk each day.  Do not sit, drive, or stand in one place for more than 30 minutes at a time. Sitting or standing for long periods of time can put stress on your back.  Do not drive or use heavy machinery while taking prescription pain medicine.  Use proper lifting techniques. When you bend and lift, use positions that put less stress on your back: ? [place] your knees. ? Keep the load close to your body. ? Avoid twisting.  Exercise regularly as told by your health care provider. Exercising helps your back  heal faster and helps prevent back injuries by keeping muscles strong and flexible.  Work with a physical therapist to make a safe exercise program, as recommended by your health care provider. Do any exercises as told by your physical therapist. Lifestyle  Maintain a healthy weight. Extra weight puts stress on your back and makes it difficult to have good posture.  Avoid activities or situations that make you feel anxious or stressed. Stress and anxiety increase muscle tension and can make back pain worse. Learn ways to manage anxiety and stress, such as through exercise. General instructions  Sleep on a firm mattress in a comfortable position. Try lying on your side with your knees  slightly bent. If you lie on your back, put a pillow under your knees.  Follow your treatment plan as told by your health care provider. This may include: ? Cognitive or behavioral therapy. ? Acupuncture or massage therapy. ? Meditation or yoga. Contact a health care provider if:  You have pain that is not relieved with rest or medicine.  You have increasing pain going down into your legs or buttocks.  Your pain does not improve after 2 weeks.  You have pain at night.  You lose weight without trying.  You have a fever or chills. Get help right away if:  You develop new bowel or bladder control problems.  You have unusual weakness or numbness in your arms or legs.  You develop nausea or vomiting.  You develop abdominal pain.  You feel faint. Summary  Acute back pain is sudden and usually short-lived.  Use proper lifting techniques. When you bend and lift, use positions that put less stress on your back.  Take over-the-counter and prescription medicines and apply heat or ice as directed by your health care provider. This information is not intended to replace advice given to you by your health care provider. Make sure you discuss any questions you have with your health care provider. Document Revised: 11/30/2018 Document Reviewed: 03/25/2017 Elsevier Patient Education  2020 [employer] Low Back Sprain or Strain Rehab Ask your health care provider which exercises are safe for you. Do exercises exactly as told by your health care provider and adjust them as directed. It is normal to feel mild stretching, pulling, tightness, or discomfort as you do these exercises. Stop right away if you feel sudden pain or your pain gets worse. Do not begin these exercises until told by your health care provider. Stretching and range-of-motion exercises These exercises warm up your muscles and joints and improve the movement and flexibility of your back. These exercises also help to relieve  pain, numbness, and tingling. Lumbar rotation  1. Lie on your back on a firm surface and bend your knees. 2. Straighten your arms out to your sides so each arm forms a 90-degree angle (right angle) with a side of your body. 3. Slowly move (rotate) both of your knees to one side of your body until you feel a stretch in your lower back (lumbar). Try not to let your shoulders lift off the floor. 4. Hold this position for __________ seconds. 5. Tense your abdominal muscles and slowly move your knees back to the starting position. 6. Repeat this exercise on the other side of your body. Repeat __________ times. Complete this exercise __________ times a day. Single knee to chest  1. Lie on your back on a firm surface with both legs straight. 2. Bend one of your knees. Use your hands to move your  knee up toward your chest until you feel a gentle stretch in your lower back and buttock. ? Hold your leg in this position by holding on to the front of your knee. ? Keep your other leg as straight as possible. 3. Hold this position for __________ seconds. 4. Slowly return to the starting position. 5. Repeat with your other leg. Repeat __________ times. Complete this exercise __________ times a day. Prone extension on elbows  1. Lie on your abdomen on a firm surface (prone position). 2. Prop yourself up on your elbows. 3. Use your arms to help lift your chest up until you feel a gentle stretch in your abdomen and your lower back. ? This will place some of your body weight on your elbows. If this is uncomfortable, try stacking pillows under your chest. ? Your hips should stay down, against the surface that you are lying on. Keep your hip and back muscles relaxed. 4. Hold this position for __________ seconds. 5. Slowly relax your upper body and return to the starting position. Repeat __________ times. Complete this exercise __________ times a day. Strengthening exercises These exercises build strength  and endurance in your back. Endurance is the ability to use your muscles for a long time, even after they get tired. Pelvic tilt This exercise strengthens the muscles that lie deep in the abdomen. 1. Lie on your back on a firm surface. Bend your knees and keep your feet flat on the floor. 2. Tense your abdominal muscles. Tip your pelvis up toward the ceiling and flatten your lower back into the floor. ? To help with this exercise, you may place a small towel under your lower back and try to push your back into the towel. 3. Hold this position for __________ seconds. 4. Let your muscles relax completely before you repeat this exercise. Repeat __________ times. Complete this exercise __________ times a day. Alternating arm and leg raises  1. Get on your hands and knees on a firm surface. If you are on a hard floor, you may want to use padding, such as an exercise mat, to cushion your knees. 2. Line up your arms and legs. Your hands should be directly below your shoulders, and your knees should be directly below your hips. 3. Lift your left leg behind you. At the same time, raise your right arm and straighten it in front of you. ? Do not lift your leg higher than your hip. ? Do not lift your arm higher than your shoulder. ? Keep your abdominal and back muscles tight. ? Keep your hips facing the ground. ? Do not arch your back. ? Keep your balance carefully, and do not hold your breath. 4. Hold this position for __________ seconds. 5. Slowly return to the starting position. 6. Repeat with your right leg and your left arm. Repeat __________ times. Complete this exercise __________ times a day. Abdominal set with straight leg raise  1. Lie on your back on a firm surface. 2. Bend one of your knees and keep your other leg straight. 3. Tense your abdominal muscles and lift your straight leg up, 4-6 inches (10-15 cm) off the ground. 4. Keep your abdominal muscles tight and hold this position for  __________ seconds. ? Do not hold your breath. ? Do not arch your back. Keep it flat against the ground. 5. Keep your abdominal muscles tense as you slowly lower your leg back to the starting position. 6. Repeat with your other leg. Repeat __________ times.  Complete this exercise __________ times a day. Single leg lower with bent knees 1. Lie on your back on a firm surface. 2. Tense your abdominal muscles and lift your feet off the floor, one foot at a time, so your knees and hips are bent in 90-degree angles (right angles). ? Your knees should be over your hips and your lower legs should be parallel to the floor. 3. Keeping your abdominal muscles tense and your knee bent, slowly lower one of your legs so your toe touches the ground. 4. Lift your leg back up to return to the starting position. ? Do not hold your breath. ? Do not let your back arch. Keep your back flat against the ground. 5. Repeat with your other leg. Repeat __________ times. Complete this exercise __________ times a day. Posture and body mechanics Good posture and healthy body mechanics can help to relieve stress in your body's tissues and joints. Body mechanics refers to the movements and positions of your body while you do your daily activities. Posture is part of body mechanics. Good posture means:  Your spine is in its natural S-curve position (neutral).  Your shoulders are pulled back slightly.  Your head is not tipped forward. Follow these guidelines to improve your posture and body mechanics in your everyday activities. Standing   When standing, keep your spine neutral and your feet about hip width apart. Keep a slight bend in your knees. Your ears, shoulders, and hips should line up.  When you do a task in which you stand in one place for a long time, place one foot up on a stable object that is 2-4 inches (5-10 cm) high, such as a footstool. This helps keep your spine neutral. Sitting   When sitting, keep  your spine neutral and keep your feet flat on the floor. Use a footrest, if necessary, and keep your thighs parallel to the floor. Avoid rounding your shoulders, and avoid tilting your head forward.  When working at a desk or a computer, keep your desk at a height where your hands are slightly lower than your elbows. Slide your chair under your desk so you are close enough to maintain good posture.  When working at a computer, place your monitor at a height where you are looking straight ahead and you do not have to tilt your head forward or downward to look at the screen. Resting  When lying down and resting, avoid positions that are most painful for you.  If you have pain with activities such as sitting, bending, stooping, or squatting, lie in a position in which your body does not bend very much. For example, avoid curling up on your side with your arms and knees near your chest (fetal position).  If you have pain with activities such as standing for a long time or reaching with your arms, lie with your spine in a neutral position and bend your knees slightly. Try the following positions: ? Lying on your side with a pillow between your knees. ? Lying on your back with a pillow under your knees. Lifting   When lifting objects, keep your feet at least shoulder width apart and tighten your abdominal muscles.  Bend your knees and hips and keep your spine neutral. It is important to lift using the strength of your legs, not your back. Do not lock your knees straight out.  Always ask for help to lift heavy or awkward objects. This information is not intended to replace advice  given to you by your health care provider. Make sure you discuss any questions you have with your health care provider. Document Revised: 12/03/2018 Document Reviewed: 09/02/2018 Elsevier Patient Education  2020 [employer].

## 2019-08-27 ENCOUNTER — Other Ambulatory Visit: Payer: Self-pay

## 2019-08-27 ENCOUNTER — Inpatient Hospital Stay (HOSPITAL_COMMUNITY)
Admission: EM | Admit: 2019-08-27 | Discharge: 2019-09-02 | DRG: 438 | Disposition: A | Discharge status: Home / Self Care | Payer: Managed Care, Other (non HMO) | Attending: Internal Medicine | Admitting: Internal Medicine

## 2019-08-27 ENCOUNTER — Emergency Department (HOSPITAL_COMMUNITY): Payer: Managed Care, Other (non HMO)

## 2019-08-27 ENCOUNTER — Encounter (HOSPITAL_COMMUNITY): Payer: Self-pay | Admitting: Emergency Medicine

## 2019-08-27 DIAGNOSIS — Z720 Tobacco use: Secondary | ICD-10-CM | POA: Diagnosis not present

## 2019-08-27 DIAGNOSIS — Z8261 Family history of arthritis: Secondary | ICD-10-CM | POA: Diagnosis not present

## 2019-08-27 DIAGNOSIS — Z91040 Latex allergy status: Secondary | ICD-10-CM

## 2019-08-27 DIAGNOSIS — F1010 Alcohol abuse, uncomplicated: Secondary | ICD-10-CM | POA: Diagnosis present

## 2019-08-27 DIAGNOSIS — Z8249 Family history of ischemic heart disease and other diseases of the circulatory system: Secondary | ICD-10-CM | POA: Diagnosis not present

## 2019-08-27 DIAGNOSIS — E860 Dehydration: Secondary | ICD-10-CM | POA: Diagnosis present

## 2019-08-27 DIAGNOSIS — Z813 Family history of other psychoactive substance abuse and dependence: Secondary | ICD-10-CM

## 2019-08-27 DIAGNOSIS — I6783 Posterior reversible encephalopathy syndrome: Secondary | ICD-10-CM | POA: Diagnosis present

## 2019-08-27 DIAGNOSIS — E876 Hypokalemia: Secondary | ICD-10-CM | POA: Diagnosis not present

## 2019-08-27 DIAGNOSIS — Z888 Allergy status to other drugs, medicaments and biological substances status: Secondary | ICD-10-CM | POA: Diagnosis not present

## 2019-08-27 DIAGNOSIS — Z91013 Allergy to seafood: Secondary | ICD-10-CM | POA: Diagnosis not present

## 2019-08-27 DIAGNOSIS — R569 Unspecified convulsions: Secondary | ICD-10-CM | POA: Diagnosis present

## 2019-08-27 DIAGNOSIS — Z833 Family history of diabetes mellitus: Secondary | ICD-10-CM | POA: Diagnosis not present

## 2019-08-27 DIAGNOSIS — G43909 Migraine, unspecified, not intractable, without status migrainosus: Secondary | ICD-10-CM | POA: Diagnosis present

## 2019-08-27 DIAGNOSIS — R739 Hyperglycemia, unspecified: Secondary | ICD-10-CM | POA: Diagnosis present

## 2019-08-27 DIAGNOSIS — Z803 Family history of malignant neoplasm of breast: Secondary | ICD-10-CM

## 2019-08-27 DIAGNOSIS — R519 Headache, unspecified: Secondary | ICD-10-CM | POA: Diagnosis not present

## 2019-08-27 DIAGNOSIS — Z91010 Allergy to peanuts: Secondary | ICD-10-CM | POA: Diagnosis not present

## 2019-08-27 DIAGNOSIS — Z811 Family history of alcohol abuse and dependence: Secondary | ICD-10-CM | POA: Diagnosis not present

## 2019-08-27 DIAGNOSIS — I10 Essential (primary) hypertension: Secondary | ICD-10-CM | POA: Diagnosis present

## 2019-08-27 DIAGNOSIS — H539 Unspecified visual disturbance: Secondary | ICD-10-CM | POA: Diagnosis not present

## 2019-08-27 DIAGNOSIS — Z87891 Personal history of nicotine dependence: Secondary | ICD-10-CM

## 2019-08-27 DIAGNOSIS — K861 Other chronic pancreatitis: Secondary | ICD-10-CM | POA: Diagnosis present

## 2019-08-27 DIAGNOSIS — K859 Acute pancreatitis without necrosis or infection, unspecified: Secondary | ICD-10-CM | POA: Diagnosis not present

## 2019-08-27 DIAGNOSIS — Z880 Allergy status to penicillin: Secondary | ICD-10-CM

## 2019-08-27 DIAGNOSIS — Z20822 Contact with and (suspected) exposure to covid-19: Secondary | ICD-10-CM | POA: Diagnosis present

## 2019-08-27 DIAGNOSIS — J45909 Unspecified asthma, uncomplicated: Secondary | ICD-10-CM | POA: Diagnosis present

## 2019-08-27 DIAGNOSIS — R1013 Epigastric pain: Secondary | ICD-10-CM | POA: Diagnosis present

## 2019-08-27 DIAGNOSIS — K852 Alcohol induced acute pancreatitis without necrosis or infection: Principal | ICD-10-CM

## 2019-08-27 DIAGNOSIS — Z825 Family history of asthma and other chronic lower respiratory diseases: Secondary | ICD-10-CM | POA: Diagnosis not present

## 2019-08-27 DIAGNOSIS — N179 Acute kidney failure, unspecified: Secondary | ICD-10-CM | POA: Diagnosis present

## 2019-08-27 DIAGNOSIS — E872 Acidosis, unspecified: Secondary | ICD-10-CM

## 2019-08-27 LAB — RAPID URINE DRUG SCREEN, HOSP PERFORMED
Amphetamines: NOT DETECTED
Barbiturates: NOT DETECTED
Benzodiazepines: NOT DETECTED
Cocaine: NOT DETECTED
Opiates: NOT DETECTED
Tetrahydrocannabinol: NOT DETECTED

## 2019-08-27 LAB — URINALYSIS, ROUTINE W REFLEX MICROSCOPIC
Glucose, UA: NEGATIVE mg/dL
Ketones, ur: 80 mg/dL — AB
Leukocytes,Ua: NEGATIVE
Nitrite: NEGATIVE
Protein, ur: >=300 mg/dL — AB
Specific Gravity, Urine: 1.024 (ref 1.005–1.030)
pH: 6 (ref 5.0–8.0)

## 2019-08-27 LAB — I-STAT BETA HCG BLOOD, ED (MC, WL, AP ONLY): I-stat hCG, quantitative: <5 m[IU]/mL (ref <5)

## 2019-08-27 LAB — CBC
HCT: 41.5 % (ref 36.0–46.0)
Hemoglobin: 13.7 g/dL (ref 12.0–15.0)
MCH: 35.2 pg — ABNORMAL HIGH (ref 26.0–34.0)
MCHC: 33 g/dL (ref 30.0–36.0)
MCV: 106.7 fL — ABNORMAL HIGH (ref 80.0–100.0)
Platelets: 204 10*3/uL (ref 150–400)
RBC: 3.89 MIL/uL (ref 3.87–5.11)
RDW: 12.7 % (ref 11.5–15.5)
WBC: 14.7 10*3/uL — ABNORMAL HIGH (ref 4.0–10.5)
nRBC: 0 % (ref 0.0–0.2)

## 2019-08-27 LAB — COMPREHENSIVE METABOLIC PANEL
ALT: 39 U/L (ref 0–44)
AST: 59 U/L — ABNORMAL HIGH (ref 15–41)
Albumin: 4.7 g/dL (ref 3.5–5.0)
Alkaline Phosphatase: 47 U/L (ref 38–126)
Anion gap: 29 — ABNORMAL HIGH (ref 5–15)
BUN: 20 mg/dL (ref 6–20)
CO2: 12 mmol/L — ABNORMAL LOW (ref 22–32)
Calcium: 9.4 mg/dL (ref 8.9–10.3)
Chloride: 95 mmol/L — ABNORMAL LOW (ref 98–111)
Creatinine, Ser: 1.69 mg/dL — ABNORMAL HIGH (ref 0.44–1.00)
GFR calc Af Amer: 46 mL/min — ABNORMAL LOW (ref >60)
GFR calc non Af Amer: 40 mL/min — ABNORMAL LOW (ref >60)
Glucose, Bld: 180 mg/dL — ABNORMAL HIGH (ref 70–99)
Potassium: 4.9 mmol/L (ref 3.5–5.1)
Sodium: 136 mmol/L (ref 135–145)
Total Bilirubin: 1.7 mg/dL — ABNORMAL HIGH (ref 0.3–1.2)
Total Protein: 9.4 g/dL — ABNORMAL HIGH (ref 6.5–8.1)

## 2019-08-27 LAB — LACTIC ACID, PLASMA
Lactic Acid, Venous: 2.1 mmol/L (ref 0.5–1.9)
Lactic Acid, Venous: 1.3 mmol/L (ref 0.5–1.9)

## 2019-08-27 LAB — ETHANOL: Alcohol, Ethyl (B): <10 mg/dL (ref <10)

## 2019-08-27 LAB — HEMOGLOBIN A1C
Hgb A1c MFr Bld: 5.1 % (ref 4.8–5.6)
Mean Plasma Glucose: 99.67 mg/dL

## 2019-08-27 LAB — OSMOLALITY: Osmolality: 304 mOsm/kg — ABNORMAL HIGH (ref 275–295)

## 2019-08-27 LAB — BASIC METABOLIC PANEL
Anion gap: 17 — ABNORMAL HIGH (ref 5–15)
BUN: 20 mg/dL (ref 6–20)
CO2: 17 mmol/L — ABNORMAL LOW (ref 22–32)
Calcium: 8.2 mg/dL — ABNORMAL LOW (ref 8.9–10.3)
Chloride: 101 mmol/L (ref 98–111)
Creatinine, Ser: 1.26 mg/dL — ABNORMAL HIGH (ref 0.44–1.00)
GFR calc Af Amer: >60 mL/min (ref >60)
GFR calc non Af Amer: 57 mL/min — ABNORMAL LOW (ref >60)
Glucose, Bld: 105 mg/dL — ABNORMAL HIGH (ref 70–99)
Potassium: 4.8 mmol/L (ref 3.5–5.1)
Sodium: 135 mmol/L (ref 135–145)

## 2019-08-27 LAB — SARS CORONAVIRUS 2 (TAT 6-24 HRS): SARS Coronavirus 2: NEGATIVE

## 2019-08-27 LAB — LIPASE, BLOOD: Lipase: 648 U/L — ABNORMAL HIGH (ref 11–51)

## 2019-08-27 LAB — BETA-HYDROXYBUTYRIC ACID: Beta-Hydroxybutyric Acid: 6.4 mmol/L — ABNORMAL HIGH (ref 0.05–0.27)

## 2019-08-27 MED ORDER — ONDANSETRON HCL 4 MG/2ML IJ SOLN
4.0000 mg | Freq: Once | INTRAMUSCULAR | Status: AC
  Administered 2019-08-27: 4 mg via INTRAVENOUS
  Filled 2019-08-27: qty 2

## 2019-08-27 MED ORDER — SODIUM CHLORIDE 0.9 % IV SOLN: INTRAVENOUS | Status: DC

## 2019-08-27 MED ORDER — METOPROLOL TARTRATE 25 MG PO TABS
25.0000 mg | ORAL_TABLET | Freq: Two times a day (BID) | ORAL | Status: DC
  Administered 2019-08-27 – 2019-08-30 (×7): 25 mg via ORAL
  Filled 2019-08-27 (×7): qty 1

## 2019-08-27 MED ORDER — BACLOFEN 10 MG PO TABS
10.0000 mg | ORAL_TABLET | Freq: Three times a day (TID) | ORAL | Status: DC
  Administered 2019-08-27 – 2019-09-02 (×18): 10 mg via ORAL
  Filled 2019-08-27 (×20): qty 1

## 2019-08-27 MED ORDER — ALBUTEROL SULFATE (2.5 MG/3ML) 0.083% IN NEBU: 3.0000 mL | INHALATION_SOLUTION | Freq: Four times a day (QID) | RESPIRATORY_TRACT | Status: DC | PRN

## 2019-08-27 MED ORDER — MORPHINE SULFATE (PF) 4 MG/ML IV SOLN
4.0000 mg | Freq: Once | INTRAVENOUS | Status: AC
  Administered 2019-08-27: 15:00:00 4 mg via INTRAVENOUS
  Filled 2019-08-27: qty 1

## 2019-08-27 MED ORDER — IOHEXOL 300 MG/ML  SOLN
80.0000 mL | Freq: Once | INTRAMUSCULAR | Status: AC | PRN
  Administered 2019-08-27: 80 mL via INTRAVENOUS

## 2019-08-27 MED ORDER — HYDRALAZINE HCL 25 MG PO TABS
25.0000 mg | ORAL_TABLET | Freq: Four times a day (QID) | ORAL | Status: DC | PRN
  Administered 2019-08-28 – 2019-08-31 (×8): 25 mg via ORAL
  Filled 2019-08-27 (×10): qty 1

## 2019-08-27 MED ORDER — HYDROMORPHONE HCL 2 MG PO TABS
2.0000 mg | ORAL_TABLET | ORAL | Status: DC | PRN
  Administered 2019-08-27 – 2019-08-29 (×4): 2 mg via ORAL
  Filled 2019-08-27 (×4): qty 1

## 2019-08-27 MED ORDER — SODIUM CHLORIDE 0.9 % IV BOLUS
1000.0000 mL | Freq: Once | INTRAVENOUS | Status: AC
  Administered 2019-08-27: 15:00:00 1000 mL via INTRAVENOUS

## 2019-08-27 MED ORDER — SODIUM CHLORIDE 0.9% FLUSH
3.0000 mL | Freq: Once | INTRAVENOUS | Status: AC
  Administered 2019-08-27: 15:00:00 3 mL via INTRAVENOUS

## 2019-08-27 MED ORDER — LEVETIRACETAM 500 MG PO TABS
500.0000 mg | ORAL_TABLET | Freq: Two times a day (BID) | ORAL | Status: DC
  Administered 2019-08-27 – 2019-09-02 (×12): 500 mg via ORAL
  Filled 2019-08-27 (×12): qty 1

## 2019-08-27 MED ORDER — ONDANSETRON HCL 4 MG/2ML IJ SOLN
4.0000 mg | Freq: Four times a day (QID) | INTRAMUSCULAR | Status: DC | PRN
  Administered 2019-08-29 – 2019-08-31 (×4): 4 mg via INTRAVENOUS
  Filled 2019-08-27 (×5): qty 2

## 2019-08-27 MED ORDER — IBUPROFEN 200 MG PO TABS
600.0000 mg | ORAL_TABLET | Freq: Three times a day (TID) | ORAL | Status: DC | PRN
  Administered 2019-08-28 – 2019-09-01 (×4): 600 mg via ORAL
  Filled 2019-08-27 (×4): qty 3

## 2019-08-27 MED ORDER — ACETAMINOPHEN 500 MG PO TABS
1000.0000 mg | ORAL_TABLET | Freq: Four times a day (QID) | ORAL | Status: DC | PRN
  Administered 2019-08-29 – 2019-08-30 (×2): 1000 mg via ORAL
  Filled 2019-08-27 (×2): qty 2

## 2019-08-27 MED ORDER — PANTOPRAZOLE SODIUM 40 MG IV SOLR
40.0000 mg | Freq: Once | INTRAVENOUS | Status: AC
  Administered 2019-08-27: 40 mg via INTRAVENOUS
  Filled 2019-08-27: qty 40
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
## 2019-08-27 NOTE — Plan of Care (Signed)
  Problem: Education: Goal: Knowledge of General Education information will improve Description: Including pain rating scale, medication(s)/side effects and non-pharmacologic comfort measures Outcome: Progressing   Problem: Activity: Goal: Risk for activity intolerance will decrease Outcome: Progressing   Problem: Pain Managment: Goal: General experience of comfort will improve Outcome: Progressing   

## 2019-08-27 NOTE — ED Notes (Signed)
Mother called.  Would like for Daughter to call back once settle on the Floor.
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
## 2019-08-27 NOTE — Progress Notes (Signed)
Handoff report from ED RN states BP is elevated. They are aware, will continue to monitor. Pt states BP is normally high. Skin assessment is WNL. Seizure precautions in place.
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
## 2019-08-28 DIAGNOSIS — K861 Other chronic pancreatitis: Secondary | ICD-10-CM

## 2019-08-28 DIAGNOSIS — N179 Acute kidney failure, unspecified: Secondary | ICD-10-CM

## 2019-08-28 DIAGNOSIS — E872 Acidosis: Secondary | ICD-10-CM

## 2019-08-28 DIAGNOSIS — K859 Acute pancreatitis without necrosis or infection, unspecified: Secondary | ICD-10-CM

## 2019-08-28 LAB — BASIC METABOLIC PANEL
Anion gap: 15 (ref 5–15)
BUN: 18 mg/dL (ref 6–20)
CO2: 17 mmol/L — ABNORMAL LOW (ref 22–32)
Calcium: 8.2 mg/dL — ABNORMAL LOW (ref 8.9–10.3)
Chloride: 106 mmol/L (ref 98–111)
Creatinine, Ser: 1.03 mg/dL — ABNORMAL HIGH (ref 0.44–1.00)
GFR calc Af Amer: >60 mL/min (ref >60)
GFR calc non Af Amer: >60 mL/min (ref >60)
Glucose, Bld: 111 mg/dL — ABNORMAL HIGH (ref 70–99)
Potassium: 3.8 mmol/L (ref 3.5–5.1)
Sodium: 138 mmol/L (ref 135–145)

## 2019-08-28 LAB — LIPASE, BLOOD: Lipase: 157 U/L — ABNORMAL HIGH (ref 11–51)

## 2019-08-28 LAB — CBC
HCT: 31.8 % — ABNORMAL LOW (ref 36.0–46.0)
Hemoglobin: 10.8 g/dL — ABNORMAL LOW (ref 12.0–15.0)
MCH: 35.4 pg — ABNORMAL HIGH (ref 26.0–34.0)
MCHC: 34 g/dL (ref 30.0–36.0)
MCV: 104.3 fL — ABNORMAL HIGH (ref 80.0–100.0)
Platelets: 125 K/uL — ABNORMAL LOW (ref 150–400)
RBC: 3.05 MIL/uL — ABNORMAL LOW (ref 3.87–5.11)
RDW: 13.1 % (ref 11.5–15.5)
WBC: 9 K/uL (ref 4.0–10.5)
nRBC: 0 % (ref 0.0–0.2)

## 2019-08-28 MED ORDER — PANTOPRAZOLE SODIUM 40 MG IV SOLR
40.0000 mg | Freq: Every day | INTRAVENOUS | Status: DC
  Administered 2019-08-28 – 2019-08-30 (×3): 40 mg via INTRAVENOUS
  Filled 2019-08-28 (×3): qty 40

## 2019-08-28 NOTE — Progress Notes (Signed)
Reports severe burning in her throat 2ndry to the vomiting-obtained x1 dose -IV protonix from on call phys- instructed her to follow up with the rounding phys tomorrow for subsequent dosing
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
## 2019-08-29 LAB — COMPREHENSIVE METABOLIC PANEL
ALT: 26 U/L (ref 0–44)
AST: 38 U/L (ref 15–41)
Albumin: 3.1 g/dL — ABNORMAL LOW (ref 3.5–5.0)
Alkaline Phosphatase: 33 U/L — ABNORMAL LOW (ref 38–126)
Anion gap: 14 (ref 5–15)
BUN: 11 mg/dL (ref 6–20)
CO2: 19 mmol/L — ABNORMAL LOW (ref 22–32)
Calcium: 8.3 mg/dL — ABNORMAL LOW (ref 8.9–10.3)
Chloride: 104 mmol/L (ref 98–111)
Creatinine, Ser: 0.76 mg/dL (ref 0.44–1.00)
GFR calc Af Amer: >60 mL/min (ref >60)
GFR calc non Af Amer: >60 mL/min (ref >60)
Glucose, Bld: 100 mg/dL — ABNORMAL HIGH (ref 70–99)
Potassium: 3.4 mmol/L — ABNORMAL LOW (ref 3.5–5.1)
Sodium: 137 mmol/L (ref 135–145)
Total Bilirubin: 0.6 mg/dL (ref 0.3–1.2)
Total Protein: 6.5 g/dL (ref 6.5–8.1)

## 2019-08-29 LAB — CBC
HCT: 29.7 % — ABNORMAL LOW (ref 36.0–46.0)
Hemoglobin: 10.3 g/dL — ABNORMAL LOW (ref 12.0–15.0)
MCH: 36 pg — ABNORMAL HIGH (ref 26.0–34.0)
MCHC: 34.7 g/dL (ref 30.0–36.0)
MCV: 103.8 fL — ABNORMAL HIGH (ref 80.0–100.0)
Platelets: 138 10*3/uL — ABNORMAL LOW (ref 150–400)
RBC: 2.86 MIL/uL — ABNORMAL LOW (ref 3.87–5.11)
RDW: 12.4 % (ref 11.5–15.5)
WBC: 6.5 10*3/uL (ref 4.0–10.5)
nRBC: 0 % (ref 0.0–0.2)

## 2019-08-29 LAB — LIPASE, BLOOD: Lipase: 101 U/L — ABNORMAL HIGH (ref 11–51)

## 2019-08-29 MED ORDER — HYDROMORPHONE HCL 2 MG PO TABS
1.0000 mg | ORAL_TABLET | Freq: Four times a day (QID) | ORAL | Status: DC | PRN
  Administered 2019-08-30 – 2019-08-31 (×2): 1 mg via ORAL
  Filled 2019-08-29 (×2): qty 1

## 2019-08-29 NOTE — Plan of Care (Signed)

## 2019-08-29 NOTE — Progress Notes (Signed)
PROGRESS NOTE    LILIT CINELLI  IZT:[id number] DOB: 1990/12/05 DOA: 08/27/2019 PCP: Orland Mustard, MD   Brief Narrative:  Donna Short is a 29 y.o. female with medical history significant of HTN, recurrent alcoholic pancreatitis, seizure disorder presented with severe epigastric abdominal pain for one day associated with 5-6 times off vomiting with stomach content no blood no biliary content. Patient states she had not had any alcohol for 6 months until 2 nights ago when she accidentally drank orange juice spiked with liquor.She denies fevers, chills, chest pain, shortness of breath, cough, urinary symptoms. Patient states she was constipated yesterday, had 3 small hard stool balls pass today. No blood or melena noted. In ED noted: elevated lipase, CT abdomen showed uncomplicated pancreatitis, bicarb 12 with elevated GAP.   Assessment & Plan:   Principal Problem:   Acute on chronic pancreatitis (HCC) Active Problems:   Lactic acidosis   Uncomplicated asthma   Benign essential HTN   ETOH abuse   Tobacco abuse  Acute alcoholic pancreatitis recurrent, from recent alcohol exposure unintentional versus intentional.   -CT showed uncomplicated pancreatitis, will hold off antibiotics.   -We will reinitiate clears, advance diet as tolerated, if patient has worsening abdominal pain nausea or vomiting we will make patient n.p.o. again and continue bowel rest -Continue IV fluids, will reassess in the next 12 to 24 hours pending clinical status. -Lengthy discussion at bedside about need for alcohol cessation and avoidance.  High anion gap metabolic acidosis Concurrent Lactic acidosis DKA ruled out In the setting ofketoacidosis from persistent vomiting, dehydration, elevated lactic acidosis A1C WNL  Continue IV fluids Follow BMP  Uncontrolled hypertension, improving Likely reactive in the setting of abdominal pain given above Continue home beta-blocker and PRN hydralazine  p.o. Continue pain control as above  Elevated blood sugar without history of DM A1c 5.1 Beta-Hyddroxybutyric acid elevated - likely starvation ketosis  AKI, resolved Poor PO intake/severe dehydration of persistent nauseous vomiting IV fluids as above Lab Results  Component Value Date   CREATININE 0.76 08/29/2019   CREATININE 1.03 (H) 08/28/2019   CREATININE 1.26 (H) 08/27/2019    DVT prophylaxis: SCDs and early ambulation Code Status: Full code Family Communication: None present Disposition Plan: Home, likely next 24 to 48 hours pending clinical course, tolerance of p.o. and control of abdominal pain. Consults called: None Admission status: MedSurg  Subjective: No acute issues or events overnight, abdominal pain all but resolved, patient hoping to advance diet today which is certainly reasonable.  Denies chest pain, shortness of breath, nausea, vomiting, diarrhea, constipation, headache, fevers, chills.  Objective: Vitals:   08/28/19 0812 08/28/19 1352 08/28/19 2000 08/29/19 0354  BP: (!) 137/104 (!) 161/124 (!) 158/117 (!) 143/98  Pulse: (!) 109 85 80 78  Resp:  20 20 18   Temp: 98.7 F (37.1 C) 98.2 F (36.8 C) 98.6 F (37 C) 98.9 F (37.2 C)  TempSrc: Oral Oral Oral Oral  SpO2: 99% 100% 100% 100%    Intake/Output Summary (Last 24 hours) at 08/29/2019 10/27/2019 Last data filed at 08/28/2019 1503 Gross per 24 hour  Intake 1374.14 ml  Output --  Net 1374.14 ml   There were no vitals filed for this visit.  Examination:  General:  Pleasantly resting in bed, No acute distress. HEENT:  Normocephalic atraumatic.  Sclerae nonicteric, noninjected.  Extraocular movements intact bilaterally. Neck:  Without mass or deformity.  Trachea is midline. Lungs:  Clear to auscultate bilaterally without rhonchi, wheeze, or rales. Heart:  Regular  rate and rhythm.  Without murmurs, rubs, or gallops. Abdomen:  Soft, moderately tender at the midepigastrium. Extremities: Without cyanosis,  clubbing, edema, or obvious deformity. Vascular:  Dorsalis pedis and posterior tibial pulses palpable bilaterally. Skin:  Warm and dry, no erythema, no ulcerations.   Data Reviewed: I have personally reviewed following labs and imaging studies  CBC: Recent Labs  Lab 08/25/19 1138 08/27/19 1200 08/28/19 0336 08/29/19 0256  WBC 8.6 14.7* 9.0 6.5  NEUTROABS 6.2  --   --   --   HGB 13.9 13.7 10.8* 10.3*  HCT 41.7 41.5 31.8* 29.7*  MCV 106.1* 106.7* 104.3* 103.8*  PLT 190.0 204 125* 138*   Basic Metabolic Panel: Recent Labs  Lab 08/25/19 1138 08/27/19 1200 08/27/19 1910 08/28/19 0336 08/29/19 0256  NA 138 136 135 138 137  K 4.2 4.9 4.8 3.8 3.4*  CL 97 95* 101 106 104  CO2 19 12* 17* 17* 19*  GLUCOSE 69* 180* 105* 111* 100*  BUN 16 20 20 18 11   CREATININE 0.74 1.69* 1.26* 1.03* 0.76  CALCIUM 9.5 9.4 8.2* 8.2* 8.3*  MG 1.3*  --   --   --   --    GFR: Estimated Creatinine Clearance: 81.3 mL/min (by C-G formula based on SCr of 0.76 mg/dL). Liver Function Tests: Recent Labs  Lab 08/25/19 1138 08/27/19 1200 08/29/19 0256  AST 80* 59* 38  ALT 38* 39 26  ALKPHOS 46 47 33*  BILITOT 0.5 1.7* 0.6  PROT 8.0 9.4* 6.5  ALBUMIN 4.5 4.7 3.1*   Recent Labs  Lab 08/27/19 1200 08/28/19 0336 08/29/19 0256  LIPASE 648* 157* 101*   No results for input(s): AMMONIA in the last 168 hours. Coagulation Profile: No results for input(s): INR, PROTIME in the last 168 hours. Cardiac Enzymes: Recent Labs  Lab 08/25/19 1138  CKTOTAL 68   HbA1C: Recent Labs    08/27/19 1910  HGBA1C 5.1   Sepsis Labs: Recent Labs  Lab 08/27/19 1436 08/27/19 1910  LATICACIDVEN 2.1* 1.3    Recent Results (from the past 240 hour(s))  SARS CORONAVIRUS 2 (TAT 6-24 HRS) Nasopharyngeal Nasopharyngeal Swab     Status: None   Collection Time: 08/27/19  1:56 PM   Specimen: Nasopharyngeal Swab  Result Value Ref Range Status   SARS Coronavirus 2 NEGATIVE NEGATIVE Final    Comment:  (NOTE) SARS-CoV-2 target nucleic acids are NOT DETECTED. The SARS-CoV-2 RNA is generally detectable in upper and lower respiratory specimens during the acute phase of infection. Negative results do not preclude SARS-CoV-2 infection, do not rule out co-infections with other pathogens, and should not be used as the sole basis for treatment or other patient management decisions. Negative results must be combined with clinical observations, patient history, and epidemiological information. The expected result is Negative. Fact Sheet for Patients: 10/25/19 Fact Sheet for Healthcare Providers: HairSlick.no This test is not yet approved or cleared by the [employer]ir[employer]ir.com FDA and  has been authorized for detection and/or diagnosis of SARS-CoV-2 by FDA under an Emergency Use Authorization (EUA). This EUA will remain  in effect (meaning this test can be used) for the duration of the COVID-19 declaration under Section 56 4(b)(1) of the Act, 21 U.S.C. section 360bbb-3(b)(1), unless the authorization is terminated or revoked sooner. Performed at [facility], [address], [place], [place]          Radiology Studies: CT ABDOMEN PELVIS W CONTRAST  Result Date: 08/27/2019 CLINICAL DATA:  Nausea, vomiting. History  of pancreatitis EXAM: CT ABDOMEN AND PELVIS WITH CONTRAST TECHNIQUE: Multidetector CT imaging of the abdomen and pelvis was performed using the standard protocol following bolus administration of intravenous contrast. CONTRAST:  57mL OMNIPAQUE IOHEXOL 300 MG/ML  SOLN COMPARISON:  02/24/2019 FINDINGS: Lower chest: No acute abnormality. Hepatobiliary: Diffusely decreased attenuation of the hepatic parenchyma suggesting hepatic steatosis. No focal hepatic lesion. Gallbladder appears unremarkable. No gallstones. No biliary dilatation. Pancreas: Subtle peripancreatic stranding with a small amount of free fluid in the  adjacent retroperitoneum. No areas of parenchymal non enhancement to suggest pancreatic necrosis. No well-defined fluid collections. Spleen: Normal in size without focal abnormality. Adrenals/Urinary Tract: Adrenal glands are unremarkable. Kidneys are normal, without renal calculi, focal lesion, or hydronephrosis. Bladder is unremarkable. Stomach/Bowel: Stomach is within normal limits. Appendix appears normal. No evidence of bowel wall thickening, distention, or inflammatory changes. Vascular/Lymphatic: No significant vascular findings are present. No enlarged abdominal or pelvic lymph nodes. Reproductive: Uterus and bilateral adnexa are unremarkable. Other: No abdominal wall hernia. No pneumoperitoneum. Musculoskeletal: No acute or significant osseous findings. IMPRESSION: 1. Acute uncomplicated pancreatitis. 2. Hepatic steatosis. Electronically Signed   By: Davina Poke D.O.   On: 08/27/2019 16:20        Scheduled Meds: . baclofen  10 mg Oral TID  . levETIRAcetam  500 mg Oral BID  . metoprolol tartrate  25 mg Oral BID  . pantoprazole (PROTONIX) IV  40 mg Intravenous QHS   Continuous Infusions: . sodium chloride 100 mL/hr at 08/28/19 1427     LOS: 2 days    Time spent: 81min   Lenorris Karger C Erryn Dickison, DO [facility]  If 7PM-7AM, please contact night-coverage [email or web address] Password [facility] 08/29/2019, 7:13 AM

## 2019-08-30 LAB — CBC
HCT: 37.4 % (ref 36.0–46.0)
Hemoglobin: 13.1 g/dL (ref 12.0–15.0)
MCH: 35.1 pg — ABNORMAL HIGH (ref 26.0–34.0)
MCHC: 35 g/dL (ref 30.0–36.0)
MCV: 100.3 fL — ABNORMAL HIGH (ref 80.0–100.0)
Platelets: 156 10*3/uL (ref 150–400)
RBC: 3.73 MIL/uL — ABNORMAL LOW (ref 3.87–5.11)
RDW: 11.9 % (ref 11.5–15.5)
WBC: 6.6 10*3/uL (ref 4.0–10.5)
nRBC: 0 % (ref 0.0–0.2)

## 2019-08-30 LAB — COMPREHENSIVE METABOLIC PANEL
ALT: 40 U/L (ref 0–44)
AST: 70 U/L — ABNORMAL HIGH (ref 15–41)
Albumin: 3.7 g/dL (ref 3.5–5.0)
Alkaline Phosphatase: 36 U/L — ABNORMAL LOW (ref 38–126)
Anion gap: 15 (ref 5–15)
BUN: 5 mg/dL — ABNORMAL LOW (ref 6–20)
CO2: 20 mmol/L — ABNORMAL LOW (ref 22–32)
Calcium: 9.1 mg/dL (ref 8.9–10.3)
Chloride: 103 mmol/L (ref 98–111)
Creatinine, Ser: 0.66 mg/dL (ref 0.44–1.00)
GFR calc Af Amer: >60 mL/min (ref >60)
GFR calc non Af Amer: >60 mL/min (ref >60)
Glucose, Bld: 97 mg/dL (ref 70–99)
Potassium: 3.1 mmol/L — ABNORMAL LOW (ref 3.5–5.1)
Sodium: 138 mmol/L (ref 135–145)
Total Bilirubin: 0.5 mg/dL (ref 0.3–1.2)
Total Protein: 7.7 g/dL (ref 6.5–8.1)

## 2019-08-30 LAB — LIPASE, BLOOD: Lipase: 93 U/L — ABNORMAL HIGH (ref 11–51)

## 2019-08-30 MED ORDER — POTASSIUM CHLORIDE CRYS ER 20 MEQ PO TBCR
20.0000 meq | EXTENDED_RELEASE_TABLET | Freq: Two times a day (BID) | ORAL | Status: DC
  Administered 2019-08-30 – 2019-09-02 (×7): 20 meq via ORAL
  Filled 2019-08-30 (×7): qty 1

## 2019-08-30 MED ORDER — LISINOPRIL 5 MG PO TABS
5.0000 mg | ORAL_TABLET | Freq: Every day | ORAL | Status: DC
  Administered 2019-08-30: 5 mg via ORAL
  Filled 2019-08-30: qty 1

## 2019-08-30 NOTE — Progress Notes (Addendum)
PROGRESS NOTE    Donna Short  FIE:[id number] DOB: 09/08/1990 DOA: 08/27/2019 PCP: Orland Mustard, MD   Brief Narrative:  Donna Short is a 29 y.o. female with medical history significant of HTN, recurrent alcoholic pancreatitis, seizure disorder presented with severe epigastric abdominal pain for one day associated with 5-6 times off vomiting with stomach content no blood no biliary content. Patient states she had not had any alcohol for 6 months until 2 nights ago when she accidentally drank orange juice spiked with liquor.She denies fevers, chills, chest pain, shortness of breath, cough, urinary symptoms. Patient states she was constipated yesterday, had 3 small hard stool balls pass today. No blood or melena noted. In ED noted: elevated lipase, CT abdomen showed uncomplicated pancreatitis, bicarb 12 with elevated GAP.   Assessment & Plan:   Principal Problem:   Acute on chronic pancreatitis (HCC) Active Problems:   Lactic acidosis   Uncomplicated asthma   Benign essential HTN   ETOH abuse   Tobacco abuse  Acute alcoholic pancreatitis recurrent, from recent alcohol exposure unintentional versus intentional.   -CT showed uncomplicated pancreatitis, will hold off antibiotics.   -Previously attempting to advance diet, patient had worsening nausea vomiting abdominal pain, back to full liquids -Consider advancing diet in the next 24 hours if able to tolerate full liquids without worsening symptoms -Continue IV fluids, will reassess in the next 12 to 24 hours pending clinical status. -Lengthy discussion at bedside about need for alcohol cessation and avoidance.  High anion gap metabolic acidosis, resolved Concurrent Lactic acidosis, improving DKA ruled out In the setting ofketoacidosis from persistent vomiting, dehydration, elevated lactic acidosis A1C WNL  Continue IV fluids Follow BMP  Uncontrolled hypertension, improving Likely reactive in the setting of abdominal  pain given above Continue home beta-blocker and PRN hydralazine p.o. Continue pain control as above Continues to be elevated requiring around the clock hydralazine - will add lisinopril and titrate as necessary  Elevated blood sugar without history of DM A1c 5.1 Beta-Hyddroxybutyric acid elevated - likely starvation ketosis  AKI, resolved Poor PO intake/severe dehydration of persistent nauseous vomiting IV fluids as above Lab Results  Component Value Date   CREATININE 0.66 08/30/2019   CREATININE 0.76 08/29/2019   CREATININE 1.03 (H) 08/28/2019    DVT prophylaxis: SCDs and early ambulation Code Status: Full code Family Communication: None present Disposition Plan: Home, likely next 24 to 48 hours pending clinical course, tolerance of p.o. and control of abdominal pain/nausea/vomiting. Consults called: None Admission status: MedSurg  Subjective: No acute issues or events overnight, continues to have nausea and abdominal pain with diet, have reduced diet back to full liquids, continue for the next 24 hours and reevaluate for possible advancement.  Patient somewhat disappointed she is not discharging today but understands discharging too soon and advancing diet too soon only prolong her symptoms or worsening her condition.  Objective: Vitals:   08/29/19 1755 08/29/19 2000 08/30/19 0355 08/30/19 0809  BP: (!) 148/96 (!) 167/118 (!) 165/112 (!) 166/114  Pulse:   89 86  Resp:  18 18 15   Temp:  98.4 F (36.9 C) 98.7 F (37.1 C) 98.1 F (36.7 C)  TempSrc:  Oral Oral Oral  SpO2:  100% 100% 100%    Intake/Output Summary (Last 24 hours) at 08/30/2019 1242 Last data filed at 08/30/2019 0900 Gross per 24 hour  Intake 4312.32 ml  Output -  Net 4312.32 ml   There were no vitals filed for this visit.  Examination:  General:  Pleasantly resting in bed, No acute distress. HEENT:  Normocephalic atraumatic.  Sclerae nonicteric, noninjected.  Extraocular movements intact bilaterally.  Neck:  Without mass or deformity.  Trachea is midline. Lungs:  Clear to auscultate bilaterally without rhonchi, wheeze, or rales. Heart:  Regular rate and rhythm.  Without murmurs, rubs, or gallops. Abdomen:  Soft, moderately tender at the midepigastrium, nondistended. Extremities: Without cyanosis, clubbing, edema, or obvious deformity. Vascular:  Dorsalis pedis and posterior tibial pulses palpable bilaterally. Skin:  Warm and dry, no erythema, no ulcerations.   Data Reviewed: I have personally reviewed following labs and imaging studies  CBC: Recent Labs  Lab 08/25/19 1138 08/27/19 1200 08/28/19 0336 08/29/19 0256 08/30/19 0633  WBC 8.6 14.7* 9.0 6.5 6.6  NEUTROABS 6.2  --   --   --   --   HGB 13.9 13.7 10.8* 10.3* 13.1  HCT 41.7 41.5 31.8* 29.7* 37.4  MCV 106.1* 106.7* 104.3* 103.8* 100.3*  PLT 190.0 204 125* 138* 250   Basic Metabolic Panel: Recent Labs  Lab 08/25/19 1138 08/27/19 1200 08/27/19 1910 08/28/19 0336 08/29/19 0256 08/30/19 0633  NA 138 136 135 138 137 138  K 4.2 4.9 4.8 3.8 3.4* 3.1*  CL 97 95* 101 106 104 103  CO2 19 12* 17* 17* 19* 20*  GLUCOSE 69* 180* 105* 111* 100* 97  BUN 16 20 20 18 11  5*  CREATININE 0.74 1.69* 1.26* 1.03* 0.76 0.66  CALCIUM 9.5 9.4 8.2* 8.2* 8.3* 9.1  MG 1.3*  --   --   --   --   --    GFR: Estimated Creatinine Clearance: 81.3 mL/min (by C-G formula based on SCr of 0.66 mg/dL). Liver Function Tests: Recent Labs  Lab 08/25/19 1138 08/27/19 1200 08/29/19 0256 08/30/19 0633  AST 80* 59* 38 70*  ALT 38* 39 26 40  ALKPHOS 46 47 33* 36*  BILITOT 0.5 1.7* 0.6 0.5  PROT 8.0 9.4* 6.5 7.7  ALBUMIN 4.5 4.7 3.1* 3.7   Recent Labs  Lab 08/27/19 1200 08/28/19 0336 08/29/19 0256 08/30/19 0633  LIPASE 648* 157* 101* 93*   No results for input(s): AMMONIA in the last 168 hours. Coagulation Profile: No results for input(s): INR, PROTIME in the last 168 hours. Cardiac Enzymes: Recent Labs  Lab 08/25/19 1138  CKTOTAL  68   HbA1C: Recent Labs    08/27/19 1910  HGBA1C 5.1   Sepsis Labs: Recent Labs  Lab 08/27/19 1436 08/27/19 1910  LATICACIDVEN 2.1* 1.3    Recent Results (from the past 240 hour(s))  SARS CORONAVIRUS 2 (TAT 6-24 HRS) Nasopharyngeal Nasopharyngeal Swab     Status: None   Collection Time: 08/27/19  1:56 PM   Specimen: Nasopharyngeal Swab  Result Value Ref Range Status   SARS Coronavirus 2 NEGATIVE NEGATIVE Final    Comment: (NOTE) SARS-CoV-2 target nucleic acids are NOT DETECTED. The SARS-CoV-2 RNA is generally detectable in upper and lower respiratory specimens during the acute phase of infection. Negative results do not preclude SARS-CoV-2 infection, do not rule out co-infections with other pathogens, and should not be used as the sole basis for treatment or other patient management decisions. Negative results must be combined with clinical observations, patient history, and epidemiological information. The expected result is Negative. Fact Sheet for Patients: SugarRoll.be Fact Sheet for Healthcare Providers: https://www.woods-mathews.com/ This test is not yet approved or cleared by the [place] FDA and  has been authorized for detection and/or diagnosis of SARS-CoV-2 by FDA under an Emergency Use Authorization (  EUA). This EUA will remain  in effect (meaning this test can be used) for the duration of the COVID-19 declaration under Section 56 4(b)(1) of the Act, 21 U.S.C. section 360bbb-3(b)(1), unless the authorization is terminated or revoked sooner. Performed at [facility], [address] [place], [place] [address]          Radiology Studies: No results found.      Scheduled Meds: . baclofen  10 mg Oral TID  . levETIRAcetam  500 mg Oral BID  . metoprolol tartrate  25 mg Oral BID  . pantoprazole (PROTONIX) IV  40 mg Intravenous QHS  . potassium chloride  20 mEq Oral BID   Continuous Infusions: .  sodium chloride 100 mL/hr at 08/30/19 0809     LOS: 3 days    Time spent:   Azucena Fallen, DO [facility]  If 7PM-7AM, please contact night-coverage [email or web address] Password [facility] 08/30/2019, 12:42 PM

## 2019-08-30 NOTE — Progress Notes (Signed)
Pt is ambulating to the hall. Mild abdominal cramps noted. Able to tolerate full liquid diet. BP is staying high, Dr Natale Milch notified. New BP meds added.

## 2019-08-31 ENCOUNTER — Inpatient Hospital Stay (HOSPITAL_COMMUNITY): Payer: Managed Care, Other (non HMO)

## 2019-08-31 DIAGNOSIS — H539 Unspecified visual disturbance: Secondary | ICD-10-CM

## 2019-08-31 DIAGNOSIS — Z720 Tobacco use: Secondary | ICD-10-CM

## 2019-08-31 DIAGNOSIS — F1010 Alcohol abuse, uncomplicated: Secondary | ICD-10-CM

## 2019-08-31 DIAGNOSIS — R519 Headache, unspecified: Secondary | ICD-10-CM

## 2019-08-31 DIAGNOSIS — I10 Essential (primary) hypertension: Secondary | ICD-10-CM

## 2019-08-31 LAB — CBC
HCT: 32.4 % — ABNORMAL LOW (ref 36.0–46.0)
Hemoglobin: 11.6 g/dL — ABNORMAL LOW (ref 12.0–15.0)
MCH: 35.7 pg — ABNORMAL HIGH (ref 26.0–34.0)
MCHC: 35.8 g/dL (ref 30.0–36.0)
MCV: 99.7 fL (ref 80.0–100.0)
Platelets: 173 10*3/uL (ref 150–400)
RBC: 3.25 MIL/uL — ABNORMAL LOW (ref 3.87–5.11)
RDW: 11.9 % (ref 11.5–15.5)
WBC: 7.7 10*3/uL (ref 4.0–10.5)
nRBC: 0 % (ref 0.0–0.2)

## 2019-08-31 LAB — COMPREHENSIVE METABOLIC PANEL
ALT: 38 U/L (ref 0–44)
AST: 54 U/L — ABNORMAL HIGH (ref 15–41)
Albumin: 3.4 g/dL — ABNORMAL LOW (ref 3.5–5.0)
Alkaline Phosphatase: 34 U/L — ABNORMAL LOW (ref 38–126)
Anion gap: 12 (ref 5–15)
BUN: 5 mg/dL — ABNORMAL LOW (ref 6–20)
CO2: 23 mmol/L (ref 22–32)
Calcium: 8.8 mg/dL — ABNORMAL LOW (ref 8.9–10.3)
Chloride: 103 mmol/L (ref 98–111)
Creatinine, Ser: 0.66 mg/dL (ref 0.44–1.00)
GFR calc Af Amer: >60 mL/min (ref >60)
GFR calc non Af Amer: >60 mL/min (ref >60)
Glucose, Bld: 113 mg/dL — ABNORMAL HIGH (ref 70–99)
Potassium: 3.4 mmol/L — ABNORMAL LOW (ref 3.5–5.1)
Sodium: 138 mmol/L (ref 135–145)
Total Bilirubin: 0.6 mg/dL (ref 0.3–1.2)
Total Protein: 6.9 g/dL (ref 6.5–8.1)

## 2019-08-31 LAB — LIPASE, BLOOD: Lipase: 79 U/L — ABNORMAL HIGH (ref 11–51)

## 2019-08-31 LAB — MAGNESIUM: Magnesium: 0.9 mg/dL — CL (ref 1.7–2.4)

## 2019-08-31 MED ORDER — METOPROLOL TARTRATE 50 MG PO TABS
50.0000 mg | ORAL_TABLET | Freq: Two times a day (BID) | ORAL | Status: DC
  Administered 2019-08-31 – 2019-09-02 (×5): 50 mg via ORAL
  Filled 2019-08-31 (×5): qty 1

## 2019-08-31 MED ORDER — MAGNESIUM SULFATE 2 GM/50ML IV SOLN
2.0000 g | Freq: Once | INTRAVENOUS | Status: AC
  Administered 2019-08-31: 2 g via INTRAVENOUS
  Filled 2019-08-31: qty 50

## 2019-08-31 MED ORDER — LISINOPRIL 10 MG PO TABS
10.0000 mg | ORAL_TABLET | Freq: Every day | ORAL | Status: DC
  Administered 2019-08-31 – 2019-09-02 (×3): 10 mg via ORAL
  Filled 2019-08-31 (×3): qty 1

## 2019-08-31 MED ORDER — CLONIDINE HCL 0.1 MG PO TABS
0.1000 mg | ORAL_TABLET | Freq: Two times a day (BID) | ORAL | Status: DC
  Administered 2019-08-31 – 2019-09-02 (×4): 0.1 mg via ORAL
  Filled 2019-08-31 (×5): qty 1

## 2019-08-31 MED ORDER — PANTOPRAZOLE SODIUM 40 MG PO TBEC
40.0000 mg | DELAYED_RELEASE_TABLET | Freq: Every day | ORAL | Status: DC
  Administered 2019-08-31 – 2019-09-01 (×2): 40 mg via ORAL
  Filled 2019-08-31 (×2): qty 1

## 2019-08-31 MED ORDER — BUTALBITAL-APAP-CAFFEINE 50-325-40 MG PO TABS
1.0000 | ORAL_TABLET | Freq: Four times a day (QID) | ORAL | Status: DC | PRN
  Administered 2019-08-31 (×2): 1 via ORAL
  Filled 2019-08-31 (×2): qty 1

## 2019-08-31 NOTE — Progress Notes (Signed)
Donna Short  PROGRESS NOTE    Donna Short  TIR:[id number] DOB: 1990-10-14 DOA: 08/27/2019 PCP: Orma Flaming, MD   Brief Narrative:   Donna Showman Johnsonis a 29 y.o.femalewith medical history significant ofHTN,recurrent alcoholic pancreatitis,seizure disorder presented withsevere epigastric abdominal painfor one dayassociated with 5-6 times off vomiting with stomach content no blood no biliary content. Patient states she had not had any alcohol for 6 months until 2 nights ago when she accidentally drank orange juice spiked with liquor.She denies fevers, chills, chest pain, shortness of breath, cough, urinary symptoms. Patient states she was constipated yesterday, had 3 small hard stool balls pass today. No blood or melena noted.In ED noted: elevated lipase,CT abdomen showed uncomplicated pancreatitis, bicarb 12 with elevated GAP.  08/31/19:  Reports vision changes. [place] ordered and negative. MRI Brain/orbits ordered.   Assessment & Plan:   Principal Problem:   Acute on chronic pancreatitis (HCC) Active Problems:   Lactic acidosis   Uncomplicated asthma   Benign essential HTN   ETOH abuse   Tobacco abuse  Acute alcoholic pancreatitis recurrent     - CT showed uncomplicated pancreatitis,will hold off antibiotics.     - CLD, advance as tolerated     - continue fluids     - counseled against EtOH use     - lipase improving  Highanion gap metabolic acidosis, resolved Concurrent Lactic acidosis, improving     - DKA ruled out     - ketoacidosis from persistent vomiting,dehydration, elevated lactic acidosis     - A1C WNL      - Continue IV fluids     - resolved  Uncontrolled hypertension     - titrating metoprolol and lisinopril     - PRN hydralazine p.o.  Hypokalemia     - continue K+     - check Mg2+  AKI     - resolved     - watch nephrotoxins  Vision change/HA     - CTH negative     - MRI Brain/orbits ordered, monitor     - consider ortho consult once  MRI obtained  DVT prophylaxis: SCDs Code Status: FULL Family Communication: None at bedside   Disposition Plan: TBD  ROS:  Reports HA, vision change, N. Denies CP, dyspnea. Remainder 10-pt ROS is negative for all not previously mentioned.  Subjective: "It's harder to see."  Objective: Vitals:   08/31/19 0110 08/31/19 0340 08/31/19 0812 08/31/19 1444  BP: (!) 171/122 (!) 174/117 (!) 165/126 (!) 145/111  Pulse: 98 95 94 100  Resp:  16 16 18   Temp:  98.2 F (36.8 C) 98.7 F (37.1 C) 98.8 F (37.1 C)  TempSrc:  Oral Oral Oral  SpO2: 100% 100% 100% 100%  Weight:      Height:        Intake/Output Summary (Last 24 hours) at 08/31/2019 1512 Last data filed at 08/30/2019 1935 Gross per 24 hour  Intake 480 ml  Output --  Net 480 ml   Filed Weights   08/30/19 0809  Weight: 53 kg    Examination:  General: 29 y.o. female resting in bed in NAD Eyes: PERRL, normal sclera Cardiovascular: RRR, +S1, S2, no m/g/r, equal pulses throughout Respiratory: CTABL, no w/r/r, normal WOB GI: BS+, NDNT, no masses noted, no organomegaly noted MSK: No e/c/c Neuro: A&O x 3, follows commands Psyc: Appropriate interaction and affect, calm/cooperative   Data Reviewed: I have personally reviewed following labs and imaging studies.  CBC: Recent Labs  Lab 08/25/19  1138 08/27/19 1200 08/28/19 0336 08/29/19 0256 08/30/19 0633 08/31/19 0320  WBC 8.6 14.7* 9.0 6.5 6.6 7.7  NEUTROABS 6.2  --   --   --   --   --   HGB 13.9 13.7 10.8* 10.3* 13.1 11.6*  HCT 41.7 41.5 31.8* 29.7* 37.4 32.4*  MCV 106.1* 106.7* 104.3* 103.8* 100.3* 99.7  PLT 190.0 204 125* 138* 156 173   Basic Metabolic Panel: Recent Labs  Lab 08/25/19 1138 08/27/19 1910 08/28/19 0336 08/29/19 0256 08/30/19 0633 08/31/19 0320  NA 138 135 138 137 138 138  K 4.2 4.8 3.8 3.4* 3.1* 3.4*  CL 97 101 106 104 103 103  CO2 19 17* 17* 19* 20* 23  GLUCOSE 69* 105* 111* 100* 97 113*  BUN 16 20 18 11  5* 5*  CREATININE 0.74 1.26*  1.03* 0.76 0.66 0.66  CALCIUM 9.5 8.2* 8.2* 8.3* 9.1 8.8*  MG 1.3*  --   --   --   --   --    GFR: Estimated Creatinine Clearance: 81.3 mL/min (by C-G formula based on SCr of 0.66 mg/dL). Liver Function Tests: Recent Labs  Lab 08/25/19 1138 08/27/19 1200 08/29/19 0256 08/30/19 0633 08/31/19 0320  AST 80* 59* 38 70* 54*  ALT 38* 39 26 40 38  ALKPHOS 46 47 33* 36* 34*  BILITOT 0.5 1.7* 0.6 0.5 0.6  PROT 8.0 9.4* 6.5 7.7 6.9  ALBUMIN 4.5 4.7 3.1* 3.7 3.4*   Recent Labs  Lab 08/27/19 1200 08/28/19 0336 08/29/19 0256 08/30/19 0633 08/31/19 0320  LIPASE 648* 157* 101* 93* 79*   No results for input(s): AMMONIA in the last 168 hours. Coagulation Profile: No results for input(s): INR, PROTIME in the last 168 hours. Cardiac Enzymes: Recent Labs  Lab 08/25/19 1138  CKTOTAL 68   BNP (last 3 results) No results for input(s): PROBNP in the last 8760 hours. HbA1C: No results for input(s): HGBA1C in the last 72 hours. CBG: No results for input(s): GLUCAP in the last 168 hours. Lipid Profile: No results for input(s): CHOL, HDL, LDLCALC, TRIG, CHOLHDL, LDLDIRECT in the last 72 hours. Thyroid Function Tests: No results for input(s): TSH, T4TOTAL, FREET4, T3FREE, THYROIDAB in the last 72 hours. Anemia Panel: No results for input(s): VITAMINB12, FOLATE, FERRITIN, TIBC, IRON, RETICCTPCT in the last 72 hours. Sepsis Labs: Recent Labs  Lab 08/27/19 1436 08/27/19 1910  LATICACIDVEN 2.1* 1.3    Recent Results (from the past 240 hour(s))  SARS CORONAVIRUS 2 (TAT 6-24 HRS) Nasopharyngeal Nasopharyngeal Swab     Status: None   Collection Time: 08/27/19  1:56 PM   Specimen: Nasopharyngeal Swab  Result Value Ref Range Status   SARS Coronavirus 2 NEGATIVE NEGATIVE Final    Comment: (NOTE) SARS-CoV-2 target nucleic acids are NOT DETECTED. The SARS-CoV-2 RNA is generally detectable in upper and lower respiratory specimens during the acute phase of infection. Negative results do  not preclude SARS-CoV-2 infection, do not rule out co-infections with other pathogens, and should not be used as the sole basis for treatment or other patient management decisions. Negative results must be combined with clinical observations, patient history, and epidemiological information. The expected result is Negative. Fact Sheet for Patients: 10/25/19 Fact Sheet for Healthcare Providers: HairSlick.no This test is not yet approved or cleared by the quierodirigir.com FDA and  has been authorized for detection and/or diagnosis of SARS-CoV-2 by FDA under an Emergency Use Authorization (EUA). This EUA will remain  in effect (meaning this test can be used)  for the duration of the COVID-19 declaration under Section 56 4(b)(1) of the Act, 21 U.S.C. section 360bbb-3(b)(1), unless the authorization is terminated or revoked sooner. Performed at [facility], [address], [place], [place] [address]       Radiology Studies: CT HEAD WO CONTRAST  Result Date: 08/31/2019 CLINICAL DATA:  Bilateral temporal headache, blurred vision. EXAM: CT HEAD WITHOUT CONTRAST TECHNIQUE: Contiguous axial images were obtained from the base of the skull through the vertex without intravenous contrast. COMPARISON:  01/18/2018 FINDINGS: Brain: No acute intracranial abnormality. Specifically, no hemorrhage, hydrocephalus, mass lesion, acute infarction, or significant intracranial injury. Vascular: No hyperdense vessel or unexpected calcification. Skull: No acute calvarial abnormality. Sinuses/Orbits: No acute findings Other: None IMPRESSION: No acute intracranial abnormality. Electronically Signed   By: Charlett Nose M.D.   On: 08/31/2019 11:00     Scheduled Meds: . baclofen  10 mg Oral TID  . levETIRAcetam  500 mg Oral BID  . lisinopril  10 mg Oral Daily  . metoprolol tartrate  50 mg Oral BID  . pantoprazole  40 mg Oral QHS  . potassium chloride   20 mEq Oral BID   Continuous Infusions: . sodium chloride 100 mL/hr at 08/31/19 0655     LOS: 4 days    Time spent: 35 minutes spent in the coordination of care today.    Teddy Spike, DO [facility]  If 7PM-7AM, please contact night-coverage [email or web address] 08/31/2019, 3:12 PM

## 2019-08-31 NOTE — Progress Notes (Signed)
Dr. Ronaldo Miyamoto contacted to report patient complaints of severe headache since admission, blurred vision since 2130 on 08/30/19, slight chest pain, and increased blood pressures.  Orders received to increase blood pressure medications.  Dr. Ronaldo Miyamoto in to assess patient.

## 2019-08-31 NOTE — Plan of Care (Signed)
  Problem: Pain Managment: Goal: General experience of comfort will improve Outcome: Progressing   Problem: Safety: Goal: Ability to remain free from injury will improve Outcome: Progressing   Problem: Skin Integrity: Goal: Risk for impaired skin integrity will decrease Outcome: Progressing   

## 2019-08-31 NOTE — Progress Notes (Signed)
CRITICAL VALUE ALERT  Critical Value:  Magnesium 0.9  Date & Time Notied:  01/06 1631  Provider Notified: Ronaldo Miyamoto  Orders Received/Actions taken: Mg IV

## 2019-09-01 ENCOUNTER — Inpatient Hospital Stay (HOSPITAL_COMMUNITY): Payer: Managed Care, Other (non HMO)

## 2019-09-01 DIAGNOSIS — J45909 Unspecified asthma, uncomplicated: Secondary | ICD-10-CM

## 2019-09-01 DIAGNOSIS — I6783 Posterior reversible encephalopathy syndrome: Secondary | ICD-10-CM

## 2019-09-01 LAB — CBC
HCT: 31 % — ABNORMAL LOW (ref 36.0–46.0)
Hemoglobin: 10.7 g/dL — ABNORMAL LOW (ref 12.0–15.0)
MCH: 35.2 pg — ABNORMAL HIGH (ref 26.0–34.0)
MCHC: 34.5 g/dL (ref 30.0–36.0)
MCV: 102 fL — ABNORMAL HIGH (ref 80.0–100.0)
Platelets: 179 10*3/uL (ref 150–400)
RBC: 3.04 MIL/uL — ABNORMAL LOW (ref 3.87–5.11)
RDW: 12 % (ref 11.5–15.5)
WBC: 8 10*3/uL (ref 4.0–10.5)
nRBC: 0 % (ref 0.0–0.2)

## 2019-09-01 LAB — COMPREHENSIVE METABOLIC PANEL
ALT: 29 U/L (ref 0–44)
AST: 30 U/L (ref 15–41)
Albumin: 3.1 g/dL — ABNORMAL LOW (ref 3.5–5.0)
Alkaline Phosphatase: 30 U/L — ABNORMAL LOW (ref 38–126)
Anion gap: 14 (ref 5–15)
BUN: 7 mg/dL (ref 6–20)
CO2: 19 mmol/L — ABNORMAL LOW (ref 22–32)
Calcium: 8.7 mg/dL — ABNORMAL LOW (ref 8.9–10.3)
Chloride: 106 mmol/L (ref 98–111)
Creatinine, Ser: 0.64 mg/dL (ref 0.44–1.00)
GFR calc Af Amer: >60 mL/min (ref >60)
GFR calc non Af Amer: >60 mL/min (ref >60)
Glucose, Bld: 102 mg/dL — ABNORMAL HIGH (ref 70–99)
Potassium: 3.7 mmol/L (ref 3.5–5.1)
Sodium: 139 mmol/L (ref 135–145)
Total Bilirubin: 0.4 mg/dL (ref 0.3–1.2)
Total Protein: 6.3 g/dL — ABNORMAL LOW (ref 6.5–8.1)

## 2019-09-01 LAB — LIPASE, BLOOD: Lipase: 67 U/L — ABNORMAL HIGH (ref 11–51)

## 2019-09-01 LAB — MAGNESIUM: Magnesium: 1.8 mg/dL (ref 1.7–2.4)

## 2019-09-01 MED ORDER — GADOBUTROL 1 MMOL/ML IV SOLN
5.0000 mL | Freq: Once | INTRAVENOUS | Status: AC | PRN
  Administered 2019-09-01: 5 mL via INTRAVENOUS

## 2019-09-01 NOTE — Progress Notes (Signed)
The patient is tolerating regular diet well - denies nausea/vomiting or abd pain.  The patient has ambulated in the hallway several times and tolerated well.

## 2019-09-01 NOTE — Plan of Care (Signed)
  Problem: Clinical Measurements: Goal: Ability to maintain clinical measurements within normal limits will improve Outcome: Progressing   Problem: Clinical Measurements: Goal: Ability to maintain clinical measurements within normal limits will improve Outcome: Progressing   Problem: Health Behavior/Discharge Planning: Goal: Ability to manage health-related needs will improve Outcome: Adequate for Discharge   Problem: Education: Goal: Knowledge of General Education information will improve Description: Including pain rating scale, medication(s)/side effects and non-pharmacologic comfort measures Outcome: Adequate for Discharge

## 2019-09-01 NOTE — Progress Notes (Signed)
[facility]  PROGRESS NOTE    Donna Short  XAJ:[id number] DOB: 06/12/1991 DOA: 08/27/2019 PCP: Orland Mustard, MD   Brief Narrative:   Donna Short a 29 y.o.femalewith medical history significant ofHTN,recurrent alcoholic pancreatitis,seizure disorder presented withsevere epigastric abdominal painfor one dayassociated with 5-6 times off vomiting with stomach content no blood no biliary content. Patient states she had not had any alcohol for 6 months until 2 nights ago when she accidentally drank orange juice spiked with liquor.She denies fevers, chills, chest pain, shortness of breath, cough, urinary symptoms. Patient states she was constipated yesterday, had 3 small hard stool balls pass today. No blood or melena noted.In ED noted: elevated lipase,CT abdomen showed uncomplicated pancreatitis, bicarb 12 with elevated GAP.  09/01/19:  Appetite is improving. Let's get her tolerating diet. Her headache is also improving, but vision not quite back to base line. MRI c/w PRES -- would expect her constellation of symptoms. Now that BP is under better control. Would expect resolution of those sypmtoms soon. Expect AM discharge.    Assessment & Plan:   Principal Problem:   Acute on chronic pancreatitis (HCC) Active Problems:   Lactic acidosis   Uncomplicated asthma   Benign essential HTN   ETOH abuse   Tobacco abuse  Acute alcoholic pancreatitis recurrent     - CT showed uncomplicated pancreatitis,will hold off antibiotics.     - CLD, advance as tolerated     - continue fluids     - counseled against EtOH use     - lipase improving     - appetite improving  Highanion gap metabolic acidosis, resolved Concurrent Lactic acidosis, improving     - DKA ruled out     - ketoacidosis from persistent vomiting,dehydration, elevated lactic acidosis     - A1C WNL      - Continue IV fluids     - resolved  Uncontrolled hypertension     - metoprolol, lisinopril, clonidine   - PRN hydralazine p.o.  Hypokalemia Hypomagnesemia     - replaced, monitor  AKI     - resolved     - watch nephrotoxins  Vision change/HA     - CTH negative     - MRI Brain/orbits ordered, monitor     - MRI Brain/orbits c/w PRES  DVT prophylaxis: SCDs Code Status: FULL Family Communication: With mother on phone   Disposition Plan: D/c in AM?  ROS:  Denies CP dyspnea, N, V. Reports HA, vision changes, ab pain improved but not back to baseline . Remainder 10-pt ROS is negative for all not previously mentioned.  Subjective: "I think it's clearing up. I want to try some potatoes."  Objective: Vitals:   08/31/19 2155 09/01/19 0322 09/01/19 0811 09/01/19 1000  BP: (!) 129/98 114/88 121/90 121/90  Pulse: 90 78 76 76  Resp:  18 16   Temp:  (!) 97.4 F (36.3 C) 98.9 F (37.2 C)   TempSrc:  Oral Oral   SpO2:  100% 100%   Weight:      Height:        Intake/Output Summary (Last 24 hours) at 09/01/2019 1446 Last data filed at 08/31/2019 2300 Gross per 24 hour  Intake 3235.36 ml  Output --  Net 3235.36 ml   Filed Weights   08/30/19 0809  Weight: 53 kg    Examination:  General: 29 y.o. female resting in bed in NAD Eyes: PERRL, normal sclera ENMT: Nares patent w/o discharge, orophaynx clear, dentition normal, ears  w/o discharge/lesions/ulcers Cardiovascular: RRR, +S1, S2, no m/g/r Respiratory: CTABL, no w/r/r GI: BS+, NDNT, no masses noted MSK: No e/c/c Neuro: A&O x 3, no focal deficits Psyc: calm/cooperative   Data Reviewed: I have personally reviewed following labs and imaging studies.  CBC: Recent Labs  Lab 08/28/19 0336 08/29/19 0256 08/30/19 0633 08/31/19 0320 09/01/19 0456  WBC 9.0 6.5 6.6 7.7 8.0  HGB 10.8* 10.3* 13.1 11.6* 10.7*  HCT 31.8* 29.7* 37.4 32.4* 31.0*  MCV 104.3* 103.8* 100.3* 99.7 102.0*  PLT 125* 138* 156 173 179   Basic Metabolic Panel: Recent Labs  Lab 08/28/19 0336 08/29/19 0256 08/30/19 0633 08/31/19 0320 08/31/19 1532  09/01/19 0456  NA 138 137 138 138  --  139  K 3.8 3.4* 3.1* 3.4*  --  3.7  CL 106 104 103 103  --  106  CO2 17* 19* 20* 23  --  19*  GLUCOSE 111* 100* 97 113*  --  102*  BUN 18 11 5* 5*  --  7  CREATININE 1.03* 0.76 0.66 0.66  --  0.64  CALCIUM 8.2* 8.3* 9.1 8.8*  --  8.7*  MG  --   --   --   --  0.9* 1.8   GFR: Estimated Creatinine Clearance: 81.3 mL/min (by C-G formula based on SCr of 0.64 mg/dL). Liver Function Tests: Recent Labs  Lab 08/27/19 1200 08/29/19 0256 08/30/19 0633 08/31/19 0320 09/01/19 0456  AST 59* 38 70* 54* 30  ALT 39 26 40 38 29  ALKPHOS 47 33* 36* 34* 30*  BILITOT 1.7* 0.6 0.5 0.6 0.4  PROT 9.4* 6.5 7.7 6.9 6.3*  ALBUMIN 4.7 3.1* 3.7 3.4* 3.1*   Recent Labs  Lab 08/28/19 0336 08/29/19 0256 08/30/19 0633 08/31/19 0320 09/01/19 0456  LIPASE 157* 101* 93* 79* 67*   No results for input(s): AMMONIA in the last 168 hours. Coagulation Profile: No results for input(s): INR, PROTIME in the last 168 hours. Cardiac Enzymes: No results for input(s): CKTOTAL, CKMB, CKMBINDEX, TROPONINI in the last 168 hours. BNP (last 3 results) No results for input(s): PROBNP in the last 8760 hours. HbA1C: No results for input(s): HGBA1C in the last 72 hours. CBG: No results for input(s): GLUCAP in the last 168 hours. Lipid Profile: No results for input(s): CHOL, HDL, LDLCALC, TRIG, CHOLHDL, LDLDIRECT in the last 72 hours. Thyroid Function Tests: No results for input(s): TSH, T4TOTAL, FREET4, T3FREE, THYROIDAB in the last 72 hours. Anemia Panel: No results for input(s): VITAMINB12, FOLATE, FERRITIN, TIBC, IRON, RETICCTPCT in the last 72 hours. Sepsis Labs: Recent Labs  Lab 08/27/19 1436 08/27/19 1910  LATICACIDVEN 2.1* 1.3    Recent Results (from the past 240 hour(s))  SARS CORONAVIRUS 2 (TAT 6-24 HRS) Nasopharyngeal Nasopharyngeal Swab     Status: None   Collection Time: 08/27/19  1:56 PM   Specimen: Nasopharyngeal Swab  Result Value Ref Range Status    SARS Coronavirus 2 NEGATIVE NEGATIVE Final    Comment: (NOTE) SARS-CoV-2 target nucleic acids are NOT DETECTED. The SARS-CoV-2 RNA is generally detectable in upper and lower respiratory specimens during the acute phase of infection. Negative results do not preclude SARS-CoV-2 infection, do not rule out co-infections with other pathogens, and should not be used as the sole basis for treatment or other patient management decisions. Negative results must be combined with clinical observations, patient history, and epidemiological information. The expected result is Negative. Fact Sheet for Patients: HairSlick.no Fact Sheet for Healthcare Providers: quierodirigir.com This test is not  yet approved or cleared by the [place] and  has been authorized for detection and/or diagnosis of SARS-CoV-2 by FDA under an Emergency Use Authorization (EUA). This EUA will remain  in effect (meaning this test can be used) for the duration of the COVID-19 declaration under Section 56 4(b)(1) of the Act, 21 U.S.C. section 360bbb-3(b)(1), unless the authorization is terminated or revoked sooner. Performed at [facility], [place] [address] [place], [place] [address]       Radiology Studies: CT HEAD WO CONTRAST  Result Date: 08/31/2019 CLINICAL DATA:  Bilateral temporal headache, blurred vision. EXAM: CT HEAD WITHOUT CONTRAST TECHNIQUE: Contiguous axial images were obtained from the base of the skull through the vertex without intravenous contrast. COMPARISON:  01/18/2018 FINDINGS: Brain: No acute intracranial abnormality. Specifically, no hemorrhage, hydrocephalus, mass lesion, acute infarction, or significant intracranial injury. Vascular: No hyperdense vessel or unexpected calcification. Skull: No acute calvarial abnormality. Sinuses/Orbits: No acute findings Other: None IMPRESSION: No acute intracranial abnormality. Electronically Signed   By:  Rolm Baptise M.D.   On: 08/31/2019 11:00   MR BRAIN W WO CONTRAST  Result Date: 09/01/2019 CLINICAL DATA:  Headache and vision changes EXAM: MRI HEAD AND ORBITS WITHOUT AND WITH CONTRAST TECHNIQUE: Multiplanar, multiecho pulse sequences of the brain and surrounding structures were obtained without and with intravenous contrast. Multiplanar, multiecho pulse sequences of the orbits and surrounding structures were obtained including fat saturation techniques, before and after intravenous contrast administration. CONTRAST:  71mL GADAVIST GADOBUTROL 1 MMOL/ML IV SOLN COMPARISON:  None. FINDINGS: MRI HEAD FINDINGS Brain: There is no acute infarct, acute hemorrhage or extra-axial collection. There is hyperintense T2-weighted signal within both occipital lobes, left-greater-than-right. There are other areas of T2-weighted signal hyperintensity in the right corona radiata and posteromedial left parietal lobe. The cerebral and cerebellar volume are age-appropriate. There is no hydrocephalus. Blood-sensitive sequences show no chronic microhemorrhage or superficial siderosis. The midline structures are normal. Vascular: Normal flow voids. Skull and upper cervical spine: Normal marrow signal. Other: None. MRI ORBITS FINDINGS Orbits: No traumatic or inflammatory finding. Globes, optic nerves, orbital fat, extraocular muscles, vascular structures, and lacrimal glands are normal. Visualized sinuses: Clear. Soft tissues: Negative. IMPRESSION: 1. Areas of hyperintense T2-weighted signal within the bilateral occipital lobes and left posterior parietal lobe, consistent with posterior reversible encephalopathy syndrome (PRES). 2. Normal MRI of the orbits. Electronically Signed   By: Ulyses Jarred M.D.   On: 09/01/2019 02:07   MR ORBITS W WO CONTRAST  Result Date: 09/01/2019 CLINICAL DATA:  Headache and vision changes EXAM: MRI HEAD AND ORBITS WITHOUT AND WITH CONTRAST TECHNIQUE: Multiplanar, multiecho pulse sequences of the brain  and surrounding structures were obtained without and with intravenous contrast. Multiplanar, multiecho pulse sequences of the orbits and surrounding structures were obtained including fat saturation techniques, before and after intravenous contrast administration. CONTRAST:  29mL GADAVIST GADOBUTROL 1 MMOL/ML IV SOLN COMPARISON:  None. FINDINGS: MRI HEAD FINDINGS Brain: There is no acute infarct, acute hemorrhage or extra-axial collection. There is hyperintense T2-weighted signal within both occipital lobes, left-greater-than-right. There are other areas of T2-weighted signal hyperintensity in the right corona radiata and posteromedial left parietal lobe. The cerebral and cerebellar volume are age-appropriate. There is no hydrocephalus. Blood-sensitive sequences show no chronic microhemorrhage or superficial siderosis. The midline structures are normal. Vascular: Normal flow voids. Skull and upper cervical spine: Normal marrow signal. Other: None. MRI ORBITS FINDINGS Orbits: No traumatic or inflammatory finding. Globes, optic nerves, orbital fat, extraocular muscles, vascular structures,  and lacrimal glands are normal. Visualized sinuses: Clear. Soft tissues: Negative. IMPRESSION: 1. Areas of hyperintense T2-weighted signal within the bilateral occipital lobes and left posterior parietal lobe, consistent with posterior reversible encephalopathy syndrome (PRES). 2. Normal MRI of the orbits. Electronically Signed   By: Deatra Robinson M.D.   On: 09/01/2019 02:07     Scheduled Meds: . baclofen  10 mg Oral TID  . cloNIDine  0.1 mg Oral BID  . levETIRAcetam  500 mg Oral BID  . lisinopril  10 mg Oral Daily  . metoprolol tartrate  50 mg Oral BID  . pantoprazole  40 mg Oral QHS  . potassium chloride  20 mEq Oral BID   Continuous Infusions: . sodium chloride 100 mL/hr at 08/31/19 2300     LOS: 5 days    Time spent: 25 minutes spent in the coordination of care today.    Teddy Spike, DO [facility]  If 7PM-7AM, please contact night-coverage [email or web address] 09/01/2019, 2:46 PM

## 2019-09-02 ENCOUNTER — Telehealth: Payer: Self-pay | Admitting: Family Medicine

## 2019-09-02 ENCOUNTER — Telehealth: Payer: Self-pay

## 2019-09-02 DIAGNOSIS — E876 Hypokalemia: Secondary | ICD-10-CM

## 2019-09-02 LAB — CBC WITH DIFFERENTIAL/PLATELET
Abs Immature Granulocytes: 0.03 10*3/uL (ref 0.00–0.07)
Basophils Absolute: 0 10*3/uL (ref 0.0–0.1)
Basophils Relative: 1 %
Eosinophils Absolute: 0.1 10*3/uL (ref 0.0–0.5)
Eosinophils Relative: 1 %
HCT: 29.2 % — ABNORMAL LOW (ref 36.0–46.0)
Hemoglobin: 9.8 g/dL — ABNORMAL LOW (ref 12.0–15.0)
Immature Granulocytes: 0 %
Lymphocytes Relative: 29 %
Lymphs Abs: 2.1 10*3/uL (ref 0.7–4.0)
MCH: 35 pg — ABNORMAL HIGH (ref 26.0–34.0)
MCHC: 33.6 g/dL (ref 30.0–36.0)
MCV: 104.3 fL — ABNORMAL HIGH (ref 80.0–100.0)
Monocytes Absolute: 1 10*3/uL (ref 0.1–1.0)
Monocytes Relative: 15 %
Neutro Abs: 3.8 10*3/uL (ref 1.7–7.7)
Neutrophils Relative %: 54 %
Platelets: 204 10*3/uL (ref 150–400)
RBC: 2.8 MIL/uL — ABNORMAL LOW (ref 3.87–5.11)
RDW: 12.2 % (ref 11.5–15.5)
WBC: 7.1 10*3/uL (ref 4.0–10.5)
nRBC: 0 % (ref 0.0–0.2)

## 2019-09-02 LAB — RENAL FUNCTION PANEL
Albumin: 3 g/dL — ABNORMAL LOW (ref 3.5–5.0)
Anion gap: 11 (ref 5–15)
BUN: 11 mg/dL (ref 6–20)
CO2: 19 mmol/L — ABNORMAL LOW (ref 22–32)
Calcium: 9.1 mg/dL (ref 8.9–10.3)
Chloride: 108 mmol/L (ref 98–111)
Creatinine, Ser: 0.88 mg/dL (ref 0.44–1.00)
GFR calc Af Amer: >60 mL/min (ref >60)
GFR calc non Af Amer: >60 mL/min (ref >60)
Glucose, Bld: 107 mg/dL — ABNORMAL HIGH (ref 70–99)
Phosphorus: 3.6 mg/dL (ref 2.5–4.6)
Potassium: 4.3 mmol/L (ref 3.5–5.1)
Sodium: 138 mmol/L (ref 135–145)

## 2019-09-02 LAB — MAGNESIUM: Magnesium: 1.4 mg/dL — ABNORMAL LOW (ref 1.7–2.4)

## 2019-09-02 LAB — LIPASE, BLOOD: Lipase: 71 U/L — ABNORMAL HIGH (ref 11–51)

## 2019-09-02 MED ORDER — MAGNESIUM SULFATE 2 GM/50ML IV SOLN
2.0000 g | Freq: Once | INTRAVENOUS | Status: DC
  Filled 2019-09-02: qty 50

## 2019-09-02 MED ORDER — POTASSIUM CHLORIDE CRYS ER 20 MEQ PO TBCR: 20.0000 meq | EXTENDED_RELEASE_TABLET | Freq: Every day | ORAL | 0 refills | Status: DC

## 2019-09-02 MED ORDER — METOPROLOL TARTRATE 50 MG PO TABS: 50.0000 mg | ORAL_TABLET | Freq: Two times a day (BID) | ORAL | 0 refills | Status: DC

## 2019-09-02 MED ORDER — LISINOPRIL 10 MG PO TABS: 10.0000 mg | ORAL_TABLET | Freq: Every day | ORAL | 0 refills | Status: DC

## 2019-09-02 MED ORDER — CLONIDINE HCL 0.1 MG PO TABS: 0.1000 mg | ORAL_TABLET | Freq: Two times a day (BID) | ORAL | 0 refills | Status: DC

## 2019-09-02 NOTE — Telephone Encounter (Signed)
Patient would like a call back to discuss some important  information

## 2019-09-02 NOTE — Progress Notes (Signed)
Discharge packet provided.  The discharge instructions have been reviewed with the patient.  The patient verbalizes understanding those instructions.  The patient will be discharged to the home

## 2019-09-02 NOTE — Telephone Encounter (Signed)
Patient states she is getting discharged from the hospital and wanted to speak about making an appointment soon, she said they wrote her out until Monday but states she was told to be seen by [facility] before returning.

## 2019-09-02 NOTE — Discharge Summary (Signed)
. Physician Discharge Summary  Donna Short:[id number] DOB: 1991-05-26 DOA: 08/27/2019  PCP: Orma Flaming, MD  Admit date: 08/27/2019 Discharge date: 09/02/2019  Admitted From: Home Disposition:  Discharge to home.  Recommendations for Outpatient Follow-up:  1. Follow up with PCP in 1-2 weeks 2. Please obtain BMP/CBC/Mg2+ in one week  Discharge Condition: Stable  CODE STATUS: FULL  Brief/Interim Summary: Donna Vickrey Johnsonis a 29 y.o.femalewith medical history significant ofHTN,recurrent alcoholic pancreatitis,seizure disorder presented withsevere epigastric abdominal painfor one dayassociated with 5-6 times off vomiting with stomach content no blood no biliary content. Patient states she had not had any alcohol for 6 months until 2 nights ago when she accidentally drank orange juice spiked with liquor.She denies fevers, chills, chest pain, shortness of breath, cough, urinary symptoms. Patient states she was constipated yesterday, had 3 small hard stool balls pass today. No blood or melena noted.In ED noted: elevated lipase,CT abdomen showed uncomplicated pancreatitis, bicarb 12 with elevated GAP.  09/02/19: Ab pain resolved. Tolerating diet. HA resolved. Vision improving. MRI c/w PRES -- would expect her constellation of symptoms. Continue BP control. Mg2+ a little low this AM. Will give her IV Mg2+ before she leaves. D/c to home today. Follow up with PCP in 1 - 2 weeks. Maintain current follow up with neurology   Discharge Diagnoses:  Principal Problem:   Acute on chronic pancreatitis ([place]) Active Problems:   Lactic acidosis   Uncomplicated asthma   Benign essential HTN   ETOH abuse   Tobacco abuse  Acute alcoholic pancreatitis recurrent - CT showed uncomplicated pancreatitis,will hold off antibiotics. - CLD, advance as tolerated - continue fluids - counseled against EtOH use - lipase improving     - appetite improving     - 09/02/19:  resolved  [place] gap metabolic acidosis, resolved Concurrent Lactic acidosis, improving - DKA ruled out - ketoacidosis from persistent vomiting,dehydration, elevated lactic acidosis - A1C WNL  - Continue IV fluids - resolved  Uncontrolled hypertension - metoprolol, lisinopril, clonidine - PRN hydralazine p.o.  Hypokalemia Hypomagnesemia - replaced, monitor     - K+ ok. Will get more IV Mg2+ prior to discharge today.   AKI - resolved - watch nephrotoxins  PRES - CTH negative - MRI Brain/orbits ordered, monitor - MRI Brain/orbits c/w PRES     - now that BP is improved, HA and vision has improved; expect it to take a few days to be completely back to normal.   Discharge Instructions   Allergies as of 09/02/2019      Reactions   Claritin [loratadine] Itching   Peanut-containing Drug Products Other (See Comments)   Benadryl [diphenhydramine Hcl (sleep)] Rash   Latex Rash   Penicillins Itching, Rash   Has patient had a PCN reaction causing immediate rash, facial/tongue/throat swelling, SOB or lightheadedness with hypotension: Yes Has patient had a PCN reaction causing severe rash involving mucus membranes or skin necrosis: No Has patient had a PCN reaction that required hospitalization: Yes Has patient had a PCN reaction occurring within the last 10 years: No If all of the above answers are "NO", then may proceed with Cephalosporin use.;   Shrimp [shellfish Allergy] Hives, Rash      Medication List    TAKE these medications   acetaminophen 500 MG tablet Commonly known as: TYLENOL Take 1,000 mg by mouth every 6 (six) hours as needed for mild pain.   albuterol 108 (90 Base) MCG/ACT inhaler Commonly known as: VENTOLIN HFA Inhale 1-2 puffs into the lungs  every 6 (six) hours as needed for wheezing or shortness of breath.   baclofen 10 MG tablet Commonly known as: LIORESAL Take 1 tablet (10 mg total) by mouth  3 (three) times daily.   cloNIDine 0.1 MG tablet Commonly known as: CATAPRES Take 1 tablet (0.1 mg total) by mouth 2 (two) times daily.   ibuprofen 600 MG tablet Commonly known as: ADVIL Take 1 tablet (600 mg total) by mouth every 8 (eight) hours as needed. What changed: reasons to take this   levETIRAcetam 500 MG tablet Commonly known as: Keppra Take one tablet twice daily - twelve hours apart.  PCP will need to provide future refills.   lisinopril 10 MG tablet Commonly known as: ZESTRIL Take 1 tablet (10 mg total) by mouth daily. Start taking on: September 03, 2019   metoprolol tartrate 50 MG tablet Commonly known as: LOPRESSOR Take 1 tablet (50 mg total) by mouth 2 (two) times daily. What changed:   medication strength  how much to take   potassium chloride SA 20 MEQ tablet Commonly known as: KLOR-CON Take 1 tablet (20 mEq total) by mouth daily for 14 days.       Allergies  Allergen Reactions  . Claritin [Loratadine] Itching  . Peanut-Containing Drug Products Other (See Comments)  . Benadryl [Diphenhydramine Hcl (Sleep)] Rash  . Latex Rash  . Penicillins Itching and Rash    Has patient had a PCN reaction causing immediate rash, facial/tongue/throat swelling, SOB or lightheadedness with hypotension: Yes Has patient had a PCN reaction causing severe rash involving mucus membranes or skin necrosis: No Has patient had a PCN reaction that required hospitalization: Yes Has patient had a PCN reaction occurring within the last 10 years: No If all of the above answers are "NO", then may proceed with Cephalosporin use.;  . Shrimp [Shellfish Allergy] Hives and Rash    Procedures/Studies: CT HEAD WO CONTRAST  Result Date: 08/31/2019 CLINICAL DATA:  Bilateral temporal headache, blurred vision. EXAM: CT HEAD WITHOUT CONTRAST TECHNIQUE: Contiguous axial images were obtained from the base of the skull through the vertex without intravenous contrast. COMPARISON:  01/18/2018  FINDINGS: Brain: No acute intracranial abnormality. Specifically, no hemorrhage, hydrocephalus, mass lesion, acute infarction, or significant intracranial injury. Vascular: No hyperdense vessel or unexpected calcification. Skull: No acute calvarial abnormality. Sinuses/Orbits: No acute findings Other: None IMPRESSION: No acute intracranial abnormality. Electronically Signed   By: Charlett Nose M.D.   On: 08/31/2019 11:00   MR BRAIN W WO CONTRAST  Result Date: 09/01/2019 CLINICAL DATA:  Headache and vision changes EXAM: MRI HEAD AND ORBITS WITHOUT AND WITH CONTRAST TECHNIQUE: Multiplanar, multiecho pulse sequences of the brain and surrounding structures were obtained without and with intravenous contrast. Multiplanar, multiecho pulse sequences of the orbits and surrounding structures were obtained including fat saturation techniques, before and after intravenous contrast administration. CONTRAST:  78mL GADAVIST GADOBUTROL 1 MMOL/ML IV SOLN COMPARISON:  None. FINDINGS: MRI HEAD FINDINGS Brain: There is no acute infarct, acute hemorrhage or extra-axial collection. There is hyperintense T2-weighted signal within both occipital lobes, left-greater-than-right. There are other areas of T2-weighted signal hyperintensity in the right corona radiata and posteromedial left parietal lobe. The cerebral and cerebellar volume are age-appropriate. There is no hydrocephalus. Blood-sensitive sequences show no chronic microhemorrhage or superficial siderosis. The midline structures are normal. Vascular: Normal flow voids. Skull and upper cervical spine: Normal marrow signal. Other: None. MRI ORBITS FINDINGS Orbits: No traumatic or inflammatory finding. Globes, optic nerves, orbital fat, extraocular muscles, vascular structures, and  lacrimal glands are normal. Visualized sinuses: Clear. Soft tissues: Negative. IMPRESSION: 1. Areas of hyperintense T2-weighted signal within the bilateral occipital lobes and left posterior parietal lobe,  consistent with posterior reversible encephalopathy syndrome (PRES). 2. Normal MRI of the orbits. Electronically Signed   By: Deatra Robinson M.D.   On: 09/01/2019 02:07   CT ABDOMEN PELVIS W CONTRAST  Result Date: 08/27/2019 CLINICAL DATA:  Nausea, vomiting. History of pancreatitis EXAM: CT ABDOMEN AND PELVIS WITH CONTRAST TECHNIQUE: Multidetector CT imaging of the abdomen and pelvis was performed using the standard protocol following bolus administration of intravenous contrast. CONTRAST:  47mL OMNIPAQUE IOHEXOL 300 MG/ML  SOLN COMPARISON:  02/24/2019 FINDINGS: Lower chest: No acute abnormality. Hepatobiliary: Diffusely decreased attenuation of the hepatic parenchyma suggesting hepatic steatosis. No focal hepatic lesion. Gallbladder appears unremarkable. No gallstones. No biliary dilatation. Pancreas: Subtle peripancreatic stranding with a small amount of free fluid in the adjacent retroperitoneum. No areas of parenchymal non enhancement to suggest pancreatic necrosis. No well-defined fluid collections. Spleen: Normal in size without focal abnormality. Adrenals/Urinary Tract: Adrenal glands are unremarkable. Kidneys are normal, without renal calculi, focal lesion, or hydronephrosis. Bladder is unremarkable. Stomach/Bowel: Stomach is within normal limits. Appendix appears normal. No evidence of bowel wall thickening, distention, or inflammatory changes. Vascular/Lymphatic: No significant vascular findings are present. No enlarged abdominal or pelvic lymph nodes. Reproductive: Uterus and bilateral adnexa are unremarkable. Other: No abdominal wall hernia. No pneumoperitoneum. Musculoskeletal: No acute or significant osseous findings. IMPRESSION: 1. Acute uncomplicated pancreatitis. 2. Hepatic steatosis. Electronically Signed   By: Duanne Guess D.O.   On: 08/27/2019 16:20   MR ORBITS W WO CONTRAST  Result Date: 09/01/2019 CLINICAL DATA:  Headache and vision changes EXAM: MRI HEAD AND ORBITS WITHOUT AND WITH  CONTRAST TECHNIQUE: Multiplanar, multiecho pulse sequences of the brain and surrounding structures were obtained without and with intravenous contrast. Multiplanar, multiecho pulse sequences of the orbits and surrounding structures were obtained including fat saturation techniques, before and after intravenous contrast administration. CONTRAST:  51mL GADAVIST GADOBUTROL 1 MMOL/ML IV SOLN COMPARISON:  None. FINDINGS: MRI HEAD FINDINGS Brain: There is no acute infarct, acute hemorrhage or extra-axial collection. There is hyperintense T2-weighted signal within both occipital lobes, left-greater-than-right. There are other areas of T2-weighted signal hyperintensity in the right corona radiata and posteromedial left parietal lobe. The cerebral and cerebellar volume are age-appropriate. There is no hydrocephalus. Blood-sensitive sequences show no chronic microhemorrhage or superficial siderosis. The midline structures are normal. Vascular: Normal flow voids. Skull and upper cervical spine: Normal marrow signal. Other: None. MRI ORBITS FINDINGS Orbits: No traumatic or inflammatory finding. Globes, optic nerves, orbital fat, extraocular muscles, vascular structures, and lacrimal glands are normal. Visualized sinuses: Clear. Soft tissues: Negative. IMPRESSION: 1. Areas of hyperintense T2-weighted signal within the bilateral occipital lobes and left posterior parietal lobe, consistent with posterior reversible encephalopathy syndrome (PRES). 2. Normal MRI of the orbits. Electronically Signed   By: Deatra Robinson M.D.   On: 09/01/2019 02:07      Subjective: "I'm supposed to follow up with my doctor."  Discharge Exam: Vitals:   09/02/19 0356 09/02/19 0905  BP: 111/80 111/80  Pulse: 73 73  Resp: 16   Temp: 99.3 F (37.4 C)   SpO2: 100%    Vitals:   09/01/19 1000 09/01/19 1951 09/02/19 0356 09/02/19 0905  BP: 121/90 (!) 103/92 111/80 111/80  Pulse: 76 75 73 73  Resp:  18 16   Temp:  98.9 F (37.2 C) 99.3 F  (37.4 C)  TempSrc:  Oral Oral   SpO2:  (!) 83% 100%   Weight:      Height:        General: 29 y.o. female resting in bed in NAD Cardiovascular: RRR, +S1, S2, no m/g/r, equal pulses throughout Respiratory: CTABL, no w/r/r, normal WOB GI: BS+, NDNT, no masses noted, no organomegaly noted MSK: No e/c/c Skin: No rashes, bruises, ulcerations noted Neuro: A&O x 3, no focal deficits Psyc: Appropriate interaction and affect, calm/cooperative  The results of significant diagnostics from this hospitalization (including imaging, microbiology, ancillary and laboratory) are listed below for reference.     Microbiology: Recent Results (from the past 240 hour(s))  SARS CORONAVIRUS 2 (TAT 6-24 HRS) Nasopharyngeal Nasopharyngeal Swab     Status: None   Collection Time: 08/27/19  1:56 PM   Specimen: Nasopharyngeal Swab  Result Value Ref Range Status   SARS Coronavirus 2 NEGATIVE NEGATIVE Final    Comment: (NOTE) SARS-CoV-2 target nucleic acids are NOT DETECTED. The SARS-CoV-2 RNA is generally detectable in upper and lower respiratory specimens during the acute phase of infection. Negative results do not preclude SARS-CoV-2 infection, do not rule out co-infections with other pathogens, and should not be used as the sole basis for treatment or other patient management decisions. Negative results must be combined with clinical observations, patient history, and epidemiological information. The expected result is Negative. Fact Sheet for Patients: HairSlick.nohttps://www.fda.gov/media/138098/download Fact Sheet for Healthcare Providers: quierodirigir.comhttps://www.fda.gov/media/138095/download This test is not yet approved or cleared by the [place] FDA and  has been authorized for detection and/or diagnosis of SARS-CoV-2 by FDA under an Emergency Use Authorization (EUA). This EUA will remain  in effect (meaning this test can be used) for the duration of the COVID-19 declaration under Section 56 4(b)(1) of the Act,  21 U.S.C. section 360bbb-3(b)(1), unless the authorization is terminated or revoked sooner. Performed at [facility], [address] [place], [place] [address]      Labs: BNP (last 3 results) No results for input(s): BNP in the last 8760 hours. Basic Metabolic Panel: Recent Labs  Lab 08/29/19 0256 08/30/19 0633 08/31/19 0320 08/31/19 1532 09/01/19 0456 09/02/19 0130  NA 137 138 138  --  139 138  K 3.4* 3.1* 3.4*  --  3.7 4.3  CL 104 103 103  --  106 108  CO2 19* 20* 23  --  19* 19*  GLUCOSE 100* 97 113*  --  102* 107*  BUN 11 5* 5*  --  7 11  CREATININE 0.76 0.66 0.66  --  0.64 0.88  CALCIUM 8.3* 9.1 8.8*  --  8.7* 9.1  MG  --   --   --  0.9* 1.8 1.4*  PHOS  --   --   --   --   --  3.6   Liver Function Tests: Recent Labs  Lab 08/27/19 1200 08/29/19 0256 08/30/19 0633 08/31/19 0320 09/01/19 0456 09/02/19 0130  AST 59* 38 70* 54* 30  --   ALT 39 26 40 38 29  --   ALKPHOS 47 33* 36* 34* 30*  --   BILITOT 1.7* 0.6 0.5 0.6 0.4  --   PROT 9.4* 6.5 7.7 6.9 6.3*  --   ALBUMIN 4.7 3.1* 3.7 3.4* 3.1* 3.0*   Recent Labs  Lab 08/29/19 0256 08/30/19 0633 08/31/19 0320 09/01/19 0456 09/02/19 0130  LIPASE 101* 93* 79* 67* 71*   No results for input(s): AMMONIA in the last 168 hours. CBC: Recent Labs  Lab 08/29/19 0256 08/30/19 0633 08/31/19 0320 09/01/19 0456 09/02/19 0130  WBC 6.5 6.6 7.7 8.0 7.1  NEUTROABS  --   --   --   --  3.8  HGB 10.3* 13.1 11.6* 10.7* 9.8*  HCT 29.7* 37.4 32.4* 31.0* 29.2*  MCV 103.8* 100.3* 99.7 102.0* 104.3*  PLT 138* 156 173 179 204   Cardiac Enzymes: No results for input(s): CKTOTAL, CKMB, CKMBINDEX, TROPONINI in the last 168 hours. BNP: Invalid input(s): POCBNP CBG: No results for input(s): GLUCAP in the last 168 hours. D-Dimer No results for input(s): DDIMER in the last 72 hours. Hgb A1c No results for input(s): HGBA1C in the last 72 hours. Lipid Profile No results for input(s): CHOL, HDL, LDLCALC, TRIG,  CHOLHDL, LDLDIRECT in the last 72 hours. Thyroid function studies No results for input(s): TSH, T4TOTAL, T3FREE, THYROIDAB in the last 72 hours.  Invalid input(s): FREET3 Anemia work up No results for input(s): VITAMINB12, FOLATE, FERRITIN, TIBC, IRON, RETICCTPCT in the last 72 hours. Urinalysis    Component Value Date/Time   COLORURINE YELLOW 08/27/2019 1322   APPEARANCEUR HAZY (A) 08/27/2019 1322   LABSPEC 1.024 08/27/2019 1322   PHURINE 6.0 08/27/2019 1322   GLUCOSEU NEGATIVE 08/27/2019 1322   HGBUR MODERATE (A) 08/27/2019 1322   BILIRUBINUR SMALL (A) 08/27/2019 1322   KETONESUR 80 (A) 08/27/2019 1322   PROTEINUR >=300 (A) 08/27/2019 1322   UROBILINOGEN 0.2 10/07/2015 1958   NITRITE NEGATIVE 08/27/2019 1322   LEUKOCYTESUR NEGATIVE 08/27/2019 1322   Sepsis Labs Invalid input(s): PROCALCITONIN,  WBC,  LACTICIDVEN Microbiology Recent Results (from the past 240 hour(s))  SARS CORONAVIRUS 2 (TAT 6-24 HRS) Nasopharyngeal Nasopharyngeal Swab     Status: None   Collection Time: 08/27/19  1:56 PM   Specimen: Nasopharyngeal Swab  Result Value Ref Range Status   SARS Coronavirus 2 NEGATIVE NEGATIVE Final    Comment: (NOTE) SARS-CoV-2 target nucleic acids are NOT DETECTED. The SARS-CoV-2 RNA is generally detectable in upper and lower respiratory specimens during the acute phase of infection. Negative results do not preclude SARS-CoV-2 infection, do not rule out co-infections with other pathogens, and should not be used as the sole basis for treatment or other patient management decisions. Negative results must be combined with clinical observations, patient history, and epidemiological information. The expected result is Negative. Fact Sheet for Patients: HairSlick.no Fact Sheet for Healthcare Providers: quierodirigir.com This test is not yet approved or cleared by the [place] FDA and  has been authorized for detection  and/or diagnosis of SARS-CoV-2 by FDA under an Emergency Use Authorization (EUA). This EUA will remain  in effect (meaning this test can be used) for the duration of the COVID-19 declaration under Section 56 4(b)(1) of the Act, 21 U.S.C. section 360bbb-3(b)(1), unless the authorization is terminated or revoked sooner. Performed at [facility], [address] [place], [place] [address]      Time coordinating discharge: 35 minutes  SIGNED:   Teddy Spike, DO  [facility] 09/02/2019, 11:39 AM   If 7PM-7AM, please contact night-coverage [email or web address]

## 2019-09-02 NOTE — Telephone Encounter (Signed)
She doesn't need to see me for 1-2 weeks post hospital discharge so I can check labs. If I see her too soon, its too early to check labs. If she needs out for work longer, needs to get from hospital. We can try to get her in next Friday.   Orland Mustard, MD [facility]

## 2019-09-05 NOTE — Telephone Encounter (Signed)
Patient scheduled for hospital f/u for 1/18 @ 1pm.  I have spoken to patient and she is aware of appt.

## 2019-09-06 ENCOUNTER — Telehealth: Payer: Self-pay | Admitting: Family Medicine

## 2019-09-06 NOTE — Telephone Encounter (Signed)
Spoke with patient who "conferenced in" her supervisor, Wes.  I advised Wes that Dr. Artis Flock is out of the office today; I do not have the authority to deem patient "fit/unfit" to return to work.  I further advised him that any forms that need to be filled out in order for her to return to work would have to wait until patient's appointment on 1/18 to be filled out by Dr. Artis Flock.  Patient's supervisor was very rude and repeatedly interrupted me and talked over me.  In the middle of me explaining the above to him, he hung up on me.  Patient's supervisor was questioning if patient has seen other physicians for her condition.  I advised him that I am not at liberty to discuss patient's PHI with him due to HIPAA.  He did verbalize understanding of this before hanging up on me.  Patient was very distraught over this and expressed that this is how she is treated all of the time.

## 2019-09-06 NOTE — Telephone Encounter (Signed)
Patient calls in saying she was told to go back to work, but when she arrived to work today they told her that her pcp needed to fill out some paperwork -fitness for duty/release form- and sent her home. She does not have an appointment until Monday, but wanted to know if she could bring it in before her appointment, or does she need to wait to bring it in.

## 2019-09-12 ENCOUNTER — Ambulatory Visit (INDEPENDENT_AMBULATORY_CARE_PROVIDER_SITE_OTHER): Payer: Managed Care, Other (non HMO) | Admitting: Family Medicine

## 2019-09-12 ENCOUNTER — Encounter: Payer: Self-pay | Admitting: Family Medicine

## 2019-09-12 ENCOUNTER — Other Ambulatory Visit: Payer: Self-pay

## 2019-09-12 VITALS — BP 117/81 | HR 82 | Temp 98.0°F | Ht 62.0 in | Wt 120.8 lb

## 2019-09-12 DIAGNOSIS — Z8719 Personal history of other diseases of the digestive system: Secondary | ICD-10-CM

## 2019-09-12 DIAGNOSIS — E876 Hypokalemia: Secondary | ICD-10-CM | POA: Diagnosis not present

## 2019-09-12 DIAGNOSIS — I10 Essential (primary) hypertension: Secondary | ICD-10-CM

## 2019-09-12 DIAGNOSIS — K852 Alcohol induced acute pancreatitis without necrosis or infection: Secondary | ICD-10-CM

## 2019-09-12 MED ORDER — LISINOPRIL 20 MG PO TABS: 20.0000 mg | ORAL_TABLET | Freq: Every day | ORAL | 0 refills | Status: DC

## 2019-09-12 NOTE — Progress Notes (Signed)
Patient: Donna Short MRN: [id number] DOB: 1990/12/28 PCP: Orma Flaming, MD     Subjective:  Chief Complaint  Patient presents with  . Hospitalization Follow-up    HPI: The patient is a 29 y.o. female who presents today for hospital follow up for acute pancreatitis secondary to alcohol. Also had PRES, HTN and hypokalemia and hypomagnesium.   Admit date: 08/27/2019 Discharge date: 09/02/2019  Acute alcohol induced pancreatitis: presented to ER with severe epigastric pain x 1 day associated with 5-6 times of vomiting with stomach content. She had been sober for 6 months until 2 nights prior to her ER visit and states she accidentally drank orange juice spiked with liquor; however, she had been drinking other drinks that night as well. ER course showed elevated lipase (mildly) CT with uncomplicated pancreatitis, bicarb 12 with elevated gap. DKA was ruled out and a1c was normal.  She received 2L bolus then high rate of IV hydration, zofran and symptoms improved. Diet slowly advanced. High anion gap metabolic acidosis resolved with fluids. She denies any current pain, fever, diarrhea, nausea, vomiting. Eating regular diet.   AKI: likely secondary to dehydration from nausea, vomiting. Resolved with IVF on discharge.   HTN with PRES: MRI brain orbits c/w with PRES. CTH negative. Once BP improved HA and vision improved. She is back to baseline. She is currently on her metoprolol BID, lisinopril 10mg /day and clonidine .1mg  BID. She is taking medication as prescribed.   Hypokalemia and hypomagnesium: IV mag given and potassium repleted. Discharged on 70mEq daily x 14 days.   Stye on her right eyelid: noticed while in hospital. Has gotten slightly larger, but no other changes.   Also needs paperwork filled out to release her to work.   Review of Systems  Constitutional: Positive for fatigue.  Eyes: Negative for visual disturbance.  Respiratory: Negative for shortness of breath.    Cardiovascular: Negative for chest pain.  Gastrointestinal: Negative for abdominal pain and nausea.  Musculoskeletal: Positive for back pain.  Neurological: Negative for dizziness, seizures and headaches.  Psychiatric/Behavioral: Positive for sleep disturbance.    Allergies Patient is allergic to claritin [loratadine]; peanut-containing drug products; benadryl [diphenhydramine hcl (sleep)]; latex; penicillins; and shrimp [shellfish allergy].  Past Medical History Patient  has a past medical history of Asthma, History of chicken pox, Hypertension, Migraines, Pancreatitis (01/2018), and Seizures ([place]).  Surgical History Patient  has a past surgical history that includes Wisdom tooth extraction.  Family History Pateint's family history includes Alcohol abuse in her father; Arthritis in her father; Asthma in her mother; Breast cancer in her mother; Diabetes in her father; Drug abuse in her mother; Hypertension in her mother.  Social History Patient  reports that she quit smoking about 19 months ago. Her smoking use included cigarettes. She has never used smokeless tobacco. She reports current alcohol use of about 9.0 standard drinks of alcohol per week. She reports that she does not use drugs.    Objective: Vitals:   09/12/19 1258  BP: 117/81  Pulse: 82  Temp: 98 F (36.7 C)  TempSrc: Temporal  SpO2: 99%  Weight: 120 lb 12.8 oz (54.8 kg)  Height: 5\' 2"  (1.575 m)    Body mass index is 22.09 kg/m.  Physical Exam Vitals reviewed.  Constitutional:      Appearance: Normal appearance. She is normal weight.  HENT:     Head: Normocephalic and atraumatic.     Right Ear: External ear normal.     Left Ear: External ear normal.  Cardiovascular:     Rate and Rhythm: Normal rate and regular rhythm.     Heart sounds: Normal heart sounds.  Pulmonary:     Effort: Pulmonary effort is normal.     Breath sounds: Normal breath sounds.  Abdominal:     General: Abdomen is flat. Bowel  sounds are normal.     Palpations: Abdomen is soft.     Tenderness: There is no abdominal tenderness.  Musculoskeletal:     Cervical back: Normal range of motion and neck supple.  Skin:    General: Skin is warm.  Neurological:     General: No focal deficit present.     Mental Status: She is alert and oriented to person, place, and time.  Psychiatric:        Mood and Affect: Mood normal.        Behavior: Behavior normal.        Assessment/plan: 1. Alcohol-induced acute pancreatitis without infection or necrosis Resolved. CT and labs/hospital course reviewed. Went over importance of alcohol abstinence.   2. Essential hypertension Continue her metoprolol, but weaning off clonidine. 4th line and not indicated at this time. Will have her slowly wean off of this and increase her lisinopril. Continue her metoprolol, used for rate control as well. Close f/u in 2 weeks for BP check. Instructions written down and went over twice with her. She is to call if any issues or doesn't understand what she is to do.   3. Hypomagnesemia Recheck today   4. Hypokalemia Was put on potassium replacement for 14 days. She has 3 more left. Will check today and then have her come back for recheck after off replacement. No more losses. No diarrhea or vomiting. Shouldn't need replacement unless continues to drink.   Work paperwork filled out. Released for work. Still restriction on weight lifting x 6 months. Requested back brace and water bottle.   This visit occurred during the SARS-CoV-2 public health emergency.  Safety protocols were in place, including screening questions prior to the visit, additional usage of staff PPE, and extensive cleaning of exam room while observing appropriate contact time as indicated for disinfecting solutions.    Return in about 2 weeks (around 09/26/2019) for blood pressure .  Orland Mustard, MD  [facility]  09/12/2019

## 2019-09-12 NOTE — Patient Instructions (Signed)
For your blood pressure.   -continue metoprolol twice a day  -im going to increase you lisinopril to 20mg /day. I will send in new pill for this.   Clonidine I am going to wean you off of this..... Take this once/day for 5 days then every other day for 3-4 days then stop.   Will see you back for your blood pressure in 2 weeks and lab check.

## 2019-09-12 NOTE — Addendum Note (Signed)
Addended by: Jorene Guest on: 09/12/2019 02:55 PM   Modules accepted: Orders

## 2019-09-21 ENCOUNTER — Telehealth: Payer: Self-pay | Admitting: Family Medicine

## 2019-09-21 NOTE — Telephone Encounter (Signed)
FYI

## 2019-09-21 NOTE — Telephone Encounter (Signed)
Patient called in conferenced in with [place] insurance letting Dr.Wolfe know that she was having suicidal thoughts and that she was admitted to the hospital a few days ago. Has an appointment on Monday 09/26/19.

## 2019-09-23 ENCOUNTER — Other Ambulatory Visit: Payer: Self-pay

## 2019-09-26 ENCOUNTER — Encounter: Payer: Self-pay | Admitting: Family Medicine

## 2019-09-26 ENCOUNTER — Telehealth: Payer: Self-pay | Admitting: Family Medicine

## 2019-09-26 ENCOUNTER — Other Ambulatory Visit: Payer: Self-pay

## 2019-09-26 ENCOUNTER — Ambulatory Visit (INDEPENDENT_AMBULATORY_CARE_PROVIDER_SITE_OTHER): Payer: Managed Care, Other (non HMO) | Admitting: Family Medicine

## 2019-09-26 VITALS — BP 126/80 | HR 87 | Temp 97.6°F | Ht 62.0 in | Wt 122.4 lb

## 2019-09-26 DIAGNOSIS — F320 Major depressive disorder, single episode, mild: Secondary | ICD-10-CM | POA: Diagnosis not present

## 2019-09-26 DIAGNOSIS — K852 Alcohol induced acute pancreatitis without necrosis or infection: Secondary | ICD-10-CM | POA: Diagnosis not present

## 2019-09-26 DIAGNOSIS — E876 Hypokalemia: Secondary | ICD-10-CM

## 2019-09-26 LAB — COMPREHENSIVE METABOLIC PANEL
ALT: 35 U/L (ref 0–35)
AST: 54 U/L — ABNORMAL HIGH (ref 0–37)
Albumin: 4.7 g/dL (ref 3.5–5.2)
Alkaline Phosphatase: 42 U/L (ref 39–117)
BUN: 18 mg/dL (ref 6–23)
CO2: 23 mEq/L (ref 19–32)
Calcium: 9.8 mg/dL (ref 8.4–10.5)
Chloride: 100 mEq/L (ref 96–112)
Creatinine, Ser: 0.71 mg/dL (ref 0.40–1.20)
GFR: 116.4 mL/min (ref >60.00)
Glucose, Bld: 121 mg/dL — ABNORMAL HIGH (ref 70–99)
Potassium: 4.3 mEq/L (ref 3.5–5.1)
Sodium: 137 mEq/L (ref 135–145)
Total Bilirubin: 0.5 mg/dL (ref 0.2–1.2)
Total Protein: 8.1 g/dL (ref 6.0–8.3)

## 2019-09-26 LAB — CBC WITH DIFFERENTIAL/PLATELET
Basophils Absolute: 0 10*3/uL (ref 0.0–0.1)
Basophils Relative: 0.7 % (ref 0.0–3.0)
Eosinophils Absolute: 0.1 10*3/uL (ref 0.0–0.7)
Eosinophils Relative: 1.2 % (ref 0.0–5.0)
HCT: 35.5 % — ABNORMAL LOW (ref 36.0–46.0)
Hemoglobin: 12.2 g/dL (ref 12.0–15.0)
Lymphocytes Relative: 27.7 % (ref 12.0–46.0)
Lymphs Abs: 1.8 10*3/uL (ref 0.7–4.0)
MCHC: 34.3 g/dL (ref 30.0–36.0)
MCV: 104.4 fl — ABNORMAL HIGH (ref 78.0–100.0)
Monocytes Absolute: 0.5 10*3/uL (ref 0.1–1.0)
Monocytes Relative: 8.2 % (ref 3.0–12.0)
Neutro Abs: 4.1 10*3/uL (ref 1.4–7.7)
Neutrophils Relative %: 62.2 % (ref 43.0–77.0)
Platelets: 190 10*3/uL (ref 150.0–400.0)
RBC: 3.4 Mil/uL — ABNORMAL LOW (ref 3.87–5.11)
RDW: 13.1 % (ref 11.5–15.5)
WBC: 6.5 10*3/uL (ref 4.0–10.5)

## 2019-09-26 LAB — MAGNESIUM: Magnesium: 1.2 mg/dL — ABNORMAL LOW (ref 1.5–2.5)

## 2019-09-26 NOTE — Patient Instructions (Addendum)
-  labs today  -please have them fax over [facility] and I will fill out for you and fax over. i'll make you a copy and mail to you as well.   -blood pressure is great!   -phq9 score for depression is a 4. This is very very mild. Im glad you are in therapy. Right now we will hold off on medication and do therapy, exercise. If you feel like you are becoming sad on a more regular basis or have more fleeting thoughts of harming yourself we will need to start medication. If you have any suicidal thoughts or a plan, call 911. Your therapist will also help me if she Is leaning towards thinking you need medication.

## 2019-09-26 NOTE — Telephone Encounter (Signed)
Called pt to let her know that we have received her paperwork, and it will be faxed when Dr Artis Flock returns on Wednesday.

## 2019-09-26 NOTE — Telephone Encounter (Signed)
Patient called in asking if Dr.Wolfe received her FMLA paperwork, and asked if someone could call her when they get it.

## 2019-09-26 NOTE — Telephone Encounter (Signed)
Patient wanted to let Dr.Wolfe know about the new medication she has gone back on Depo and just wanted to let her know but she has also has been having some sleepiness as a side effect.

## 2019-09-26 NOTE — Progress Notes (Signed)
Patient: Donna Short MRN: [id number] DOB: 1991-02-06 PCP: Orland Mustard, MD     Subjective:  Chief Complaint  Patient presents with  . FMLA forms  . Hypertension  . Depression    HPI: The patient is a 29 y.o. female who presents today for follow up. She had suicidal thoughts last week, but no plan and she didn't hurt herself. She called her health coach and is now set up with therapy this week with her insurance. She didn't go to a hospital or call 911. She states it was a fleeting thought and a one time incident. She hasn't had any more thoughts except that time. She states she just needed someone to talk to at that time. She states she wouldn't go through with a plan. She is not wanting to start medication at this time, but therapy alone.   Hypertension: Here for follow up of hypertension.  Currently on lisinopril and metoprolol BID. I weaned her off the clonidine last time she was here. She last took clonidine on Saturday. Takes medication as prescribed and denies any side effects. Exercise includes none. Weight has been stable. Denies any chest pain, headaches, shortness of breath, vision changes, swelling in lower extremities.   -never did labs when I saw her at her appointment.   -needing FMLA paperwork, but work never sent them over.   Review of Systems  Constitutional: Negative for chills, fatigue and fever.  Respiratory: Negative for cough and shortness of breath.   Cardiovascular: Negative for chest pain, palpitations and leg swelling.  Gastrointestinal: Negative for abdominal pain, constipation, diarrhea, nausea and vomiting.  Skin: Negative for rash.  Psychiatric/Behavioral: Negative for dysphoric mood and suicidal ideas. The patient is not nervous/anxious.     Allergies Patient is allergic to claritin [loratadine]; peanut-containing drug products; benadryl [diphenhydramine hcl (sleep)]; latex; penicillins; and shrimp [shellfish allergy].  Past Medical  History Patient  has a past medical history of Asthma, History of chicken pox, Hypertension, Migraines, Pancreatitis (01/2018), and Seizures (HCC).  Surgical History Patient  has a past surgical history that includes Wisdom tooth extraction.  Family History Pateint's family history includes Alcohol abuse in her father; Arthritis in her father; Asthma in her mother; Breast cancer in her mother; Diabetes in her father; Drug abuse in her mother; Hypertension in her mother.  Social History Patient  reports that she quit smoking about 20 months ago. Her smoking use included cigarettes. She has never used smokeless tobacco. She reports current alcohol use of about 9.0 standard drinks of alcohol per week. She reports that she does not use drugs.    Objective: Vitals:   09/26/19 1115  BP: 126/80  Pulse: 87  Temp: 97.6 F (36.4 C)  TempSrc: Temporal  SpO2: 99%  Weight: 122 lb 6.4 oz (55.5 kg)  Height: 5\' 2"  (1.575 m)    Body mass index is 22.39 kg/m.  Physical Exam Vitals reviewed.  Constitutional:      Appearance: Normal appearance. She is normal weight.  HENT:     Head: Normocephalic and atraumatic.  Cardiovascular:     Rate and Rhythm: Normal rate and regular rhythm.     Heart sounds: Normal heart sounds.  Pulmonary:     Effort: Pulmonary effort is normal.     Breath sounds: Normal breath sounds.  Abdominal:     General: Abdomen is flat. Bowel sounds are normal.     Palpations: Abdomen is soft.  Neurological:     General: No focal deficit present.  Mental Status: She is alert and oriented to person, place, and time.  Psychiatric:        Mood and Affect: Mood normal.        Behavior: Behavior normal.     Comments: No si/hi/ah/vh         Office Visit from 09/26/2019 in [place]  PHQ-9 Total Score  4         Assessment/plan: 1. Depression, major, single episode, mild (HCC) phq9 score is very mild at 4 and she had no plan with one  fleeting thought. She is starting therapy weekly today. Im fine with this and holding off on medication at this time. We will try therapy only at this time and encouraged exercise. Discussed I can work closely with therapist as well to see if medication would be warranted. I will have her come back in 1-3 months as well. Any SI/HI I do want her to call 911 or go to ER if this  Happens again.  2. Hypomagnesemia  - Magnesium  3. Hypokalemia  - Comprehensive metabolic panel  4. Alcohol-induced acute pancreatitis without infection or necrosis  - CBC with Differential/Platelet  5. Hypertension To goal off clonidine. Continue current anti hypertensive drugs. Return in 3 months for recheck. Will keep beta blocker for tachycardia as well.   -I do not have her FMLA paperwork. I did fill out her paperwork 2 weeks ago, but did not or have not received paperwork for FMLA. She does not have transportation so I will fill this out for her once I have received and not make her come back into office.   This visit occurred during the SARS-CoV-2 public health emergency.  Safety protocols were in place, including screening questions prior to the visit, additional usage of staff PPE, and extensive cleaning of exam room while observing appropriate contact time as indicated for disinfecting solutions.   Total time of encounter:  40 minutes total time of encounter, including 25 minutes spent in face-to-face patient care. This time includes coordination of care and counseling regarding new diagnosis of depression, test scores, plan of care, treatment and medication. Remainder of non-face-to-face time involved reviewing chart documents/testing relevant to the patient encounter and documentation in the medical record.   Return in about 3 months (around 12/24/2019) for blood pressure .   Orma Flaming, MD [place]   09/26/2019

## 2019-09-27 NOTE — Telephone Encounter (Signed)
Called pt to verify the medication and add it to her med list. Pt clarified that this med does not make her sleepy, she was making sure it didn't bother her sleep.

## 2019-09-28 ENCOUNTER — Telehealth: Payer: Self-pay

## 2019-09-28 ENCOUNTER — Other Ambulatory Visit: Payer: Self-pay | Admitting: Family Medicine

## 2019-09-28 NOTE — Telephone Encounter (Signed)
These are done. Left on Mellita's desk and she will call patient to get fax number for job and make a copy for the patient.  Orland Mustard, MD [place] [facility]

## 2019-09-28 NOTE — Telephone Encounter (Signed)
Faxed and mailed.

## 2019-09-28 NOTE — Telephone Encounter (Signed)
Patient calling requesting a call back regarding fmla paper work.

## 2019-09-29 NOTE — Telephone Encounter (Signed)
error 

## 2019-10-03 ENCOUNTER — Telehealth: Payer: Self-pay | Admitting: Family Medicine

## 2019-10-03 NOTE — Telephone Encounter (Signed)
Whichever place she would like is fine with me.  Orland Mustard, MD [place] [facility]

## 2019-10-03 NOTE — Telephone Encounter (Signed)
Letter ready for pick up. Please take to front desk.  Orland Mustard, MD [place] [facility]

## 2019-10-03 NOTE — Telephone Encounter (Signed)
Spoke to patient about what location she is requesting to keep her water bottle. She says that due to the medication she is taking, it causes her to walk slowly. 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

## 2019-10-03 NOTE — Telephone Encounter (Signed)
Patient called in saying that her job is needing to know where she needs to keep her water bottle the station or the breakroom -states its about 30 feet from where she is sitting- needing a returned call when some is available.

## 2019-10-03 NOTE — Telephone Encounter (Signed)
[phone]

## 2019-10-03 NOTE — Telephone Encounter (Signed)
Patient called back and asked if Dr.Wolfe could provider a letter stating her water needs to be at her station,so it could be sent to HR wanted to know if it could be faxed to them.

## 2019-10-04 NOTE — Telephone Encounter (Signed)
Requested Fax to [employer]. Sent.

## 2019-10-10 ENCOUNTER — Telehealth: Payer: Self-pay

## 2019-10-10 NOTE — Telephone Encounter (Signed)
Patient would someone to call her back regarding her fmla paper work. Please return patient call as soon as possible

## 2019-10-10 NOTE — Telephone Encounter (Signed)
Spoke with patient about forms sent by her employer. Pt states that she needs help with filling them out. Please schedule appointment to fill out Employer documents.    Thank you

## 2019-10-10 NOTE — Telephone Encounter (Signed)
Pt is calling back to cancel appointment. She says that she received from her place employment, and she no longer needs any assistance with filling out the paperwork. Appointment has been cancelled.

## 2019-10-12 ENCOUNTER — Ambulatory Visit: Payer: Managed Care, Other (non HMO) | Admitting: Family Medicine

## 2019-10-17 ENCOUNTER — Ambulatory Visit: Payer: Managed Care, Other (non HMO) | Admitting: Family Medicine

## 2019-10-18 ENCOUNTER — Telehealth: Payer: Self-pay | Admitting: Family Medicine

## 2019-10-18 NOTE — Telephone Encounter (Signed)
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  Patient states she does not have to release records if there is a letter stating that she can have bottled water in the break room.  Patient is requesting letter to be written and faxed to her employer - [employer], Attn: Lennart Pall at [phone]. Is requesting a call back in regard stating she is not allowed to return back to work until she has this letter.

## 2019-10-19 NOTE — Telephone Encounter (Signed)
Letter written. I will not do again. Unsure why you need a letter for water in the breakroom.  Orland Mustard, MD [place] [facility]

## 2019-10-19 NOTE — Telephone Encounter (Signed)
Faxed letter and noted that there will not be another letter written.

## 2019-10-21 ENCOUNTER — Ambulatory Visit: Payer: Managed Care, Other (non HMO) | Admitting: Family Medicine

## 2019-10-24 ENCOUNTER — Other Ambulatory Visit: Payer: Self-pay

## 2019-10-24 ENCOUNTER — Telehealth: Payer: Self-pay | Admitting: Family Medicine

## 2019-10-24 MED ORDER — LISINOPRIL 20 MG PO TABS: 20.0000 mg | ORAL_TABLET | Freq: Every day | ORAL | 1 refills | Status: DC

## 2019-10-24 NOTE — Progress Notes (Signed)
Sent to pharmacy 

## 2019-10-24 NOTE — Telephone Encounter (Signed)
Called pt to give message and to make her aware of refill sent to pharmacy. Pt voiced understanding. Pt says that she found a new job, that will be less stressful and that she starts therapy March 4th.

## 2019-10-24 NOTE — Telephone Encounter (Signed)
Yes she does, Please fill 90 days for her with one refill.  Thanks!  Dr. Artis Flock

## 2019-10-24 NOTE — Telephone Encounter (Signed)
Please Advise

## 2019-10-24 NOTE — Telephone Encounter (Signed)
MEDICATION: Lisinopril 20 MG tablet  PHARMACY: [facility] [address]  Comments: Pt wants Dr. Artis Flock to let her know if she needs to continue taking this medication. Please advise.   **Let patient know to contact pharmacy at the end of the day to make sure medication is ready. **  ** Please notify patient to allow 48-72 hours to process**  **Encourage patient to contact the pharmacy for refills or they can request refills through [facility]**

## 2019-11-08 ENCOUNTER — Telehealth: Payer: Self-pay

## 2019-11-08 NOTE — Telephone Encounter (Signed)
Patient calling because she's unsure if she should keep taking baclofen (LIORESAL) 10 MG tablet or not. Please follow up with patient

## 2019-11-09 NOTE — Telephone Encounter (Signed)
Called and spoke with pt and relayed below info and pt voiced understanding.

## 2019-11-09 NOTE — Telephone Encounter (Signed)
She can stop this. This was temporary for her back pain.  Orland Mustard, MD  [facility]

## 2019-11-17 ENCOUNTER — Inpatient Hospital Stay (HOSPITAL_COMMUNITY)
Admission: EM | Admit: 2019-11-17 | Discharge: 2019-11-22 | DRG: 439 | Disposition: A | Discharge status: Home / Self Care | Payer: Managed Care, Other (non HMO) | Attending: Internal Medicine | Admitting: Internal Medicine

## 2019-11-17 ENCOUNTER — Encounter (HOSPITAL_COMMUNITY): Payer: Self-pay | Admitting: Emergency Medicine

## 2019-11-17 DIAGNOSIS — R45851 Suicidal ideations: Secondary | ICD-10-CM | POA: Diagnosis present

## 2019-11-17 DIAGNOSIS — K859 Acute pancreatitis without necrosis or infection, unspecified: Secondary | ICD-10-CM | POA: Diagnosis not present

## 2019-11-17 DIAGNOSIS — K860 Alcohol-induced chronic pancreatitis: Secondary | ICD-10-CM | POA: Diagnosis present

## 2019-11-17 DIAGNOSIS — K861 Other chronic pancreatitis: Secondary | ICD-10-CM | POA: Diagnosis not present

## 2019-11-17 DIAGNOSIS — N179 Acute kidney failure, unspecified: Secondary | ICD-10-CM | POA: Diagnosis present

## 2019-11-17 DIAGNOSIS — E860 Dehydration: Secondary | ICD-10-CM | POA: Diagnosis present

## 2019-11-17 DIAGNOSIS — F10139 Alcohol abuse with withdrawal, unspecified: Secondary | ICD-10-CM | POA: Diagnosis present

## 2019-11-17 DIAGNOSIS — F1010 Alcohol abuse, uncomplicated: Secondary | ICD-10-CM | POA: Diagnosis present

## 2019-11-17 DIAGNOSIS — G40909 Epilepsy, unspecified, not intractable, without status epilepticus: Secondary | ICD-10-CM | POA: Diagnosis present

## 2019-11-17 DIAGNOSIS — Z91013 Allergy to seafood: Secondary | ICD-10-CM

## 2019-11-17 DIAGNOSIS — I10 Essential (primary) hypertension: Secondary | ICD-10-CM | POA: Diagnosis present

## 2019-11-17 DIAGNOSIS — F329 Major depressive disorder, single episode, unspecified: Secondary | ICD-10-CM | POA: Diagnosis present

## 2019-11-17 DIAGNOSIS — K7010 Alcoholic hepatitis without ascites: Secondary | ICD-10-CM | POA: Diagnosis present

## 2019-11-17 DIAGNOSIS — Z20822 Contact with and (suspected) exposure to covid-19: Secondary | ICD-10-CM | POA: Diagnosis present

## 2019-11-17 DIAGNOSIS — E875 Hyperkalemia: Secondary | ICD-10-CM | POA: Diagnosis present

## 2019-11-17 DIAGNOSIS — Z79899 Other long term (current) drug therapy: Secondary | ICD-10-CM | POA: Diagnosis not present

## 2019-11-17 DIAGNOSIS — E872 Acidosis: Secondary | ICD-10-CM | POA: Diagnosis present

## 2019-11-17 DIAGNOSIS — I160 Hypertensive urgency: Secondary | ICD-10-CM | POA: Diagnosis present

## 2019-11-17 DIAGNOSIS — Z87891 Personal history of nicotine dependence: Secondary | ICD-10-CM | POA: Diagnosis not present

## 2019-11-17 DIAGNOSIS — R109 Unspecified abdominal pain: Secondary | ICD-10-CM

## 2019-11-17 DIAGNOSIS — R739 Hyperglycemia, unspecified: Secondary | ICD-10-CM | POA: Diagnosis present

## 2019-11-17 DIAGNOSIS — K852 Alcohol induced acute pancreatitis without necrosis or infection: Principal | ICD-10-CM | POA: Diagnosis present

## 2019-11-17 DIAGNOSIS — R112 Nausea with vomiting, unspecified: Secondary | ICD-10-CM | POA: Diagnosis not present

## 2019-11-17 LAB — COMPREHENSIVE METABOLIC PANEL
ALT: 52 U/L — ABNORMAL HIGH (ref 0–44)
AST: 75 U/L — ABNORMAL HIGH (ref 15–41)
Albumin: 4.9 g/dL (ref 3.5–5.0)
Alkaline Phosphatase: 51 U/L (ref 38–126)
Anion gap: 38 — ABNORMAL HIGH (ref 5–15)
BUN: 44 mg/dL — ABNORMAL HIGH (ref 6–20)
CO2: 7 mmol/L — ABNORMAL LOW (ref 22–32)
Calcium: 9.4 mg/dL (ref 8.9–10.3)
Chloride: 93 mmol/L — ABNORMAL LOW (ref 98–111)
Creatinine, Ser: 2.22 mg/dL — ABNORMAL HIGH (ref 0.44–1.00)
GFR calc Af Amer: 33 mL/min — ABNORMAL LOW (ref >60)
GFR calc non Af Amer: 29 mL/min — ABNORMAL LOW (ref >60)
Glucose, Bld: 212 mg/dL — ABNORMAL HIGH (ref 70–99)
Potassium: 5.6 mmol/L — ABNORMAL HIGH (ref 3.5–5.1)
Sodium: 138 mmol/L (ref 135–145)
Total Bilirubin: 2.1 mg/dL — ABNORMAL HIGH (ref 0.3–1.2)
Total Protein: 9.7 g/dL — ABNORMAL HIGH (ref 6.5–8.1)

## 2019-11-17 LAB — CBC
HCT: 45.1 % (ref 36.0–46.0)
Hemoglobin: 14.6 g/dL (ref 12.0–15.0)
MCH: 36.1 pg — ABNORMAL HIGH (ref 26.0–34.0)
MCHC: 32.4 g/dL (ref 30.0–36.0)
MCV: 111.6 fL — ABNORMAL HIGH (ref 80.0–100.0)
Platelets: 200 10*3/uL (ref 150–400)
RBC: 4.04 MIL/uL (ref 3.87–5.11)
RDW: 11.4 % — ABNORMAL LOW (ref 11.5–15.5)
WBC: 16.7 10*3/uL — ABNORMAL HIGH (ref 4.0–10.5)
nRBC: 0 % (ref 0.0–0.2)

## 2019-11-17 LAB — POCT I-STAT EG7
Acid-base deficit: 16 mmol/L — ABNORMAL HIGH (ref 0.0–2.0)
Bicarbonate: 9.9 mmol/L — ABNORMAL LOW (ref 20.0–28.0)
Calcium, Ion: 1.04 mmol/L — ABNORMAL LOW (ref 1.15–1.40)
HCT: 50 % — ABNORMAL HIGH (ref 36.0–46.0)
Hemoglobin: 17 g/dL — ABNORMAL HIGH (ref 12.0–15.0)
O2 Saturation: 79 %
Potassium: 6 mmol/L — ABNORMAL HIGH (ref 3.5–5.1)
Sodium: 133 mmol/L — ABNORMAL LOW (ref 135–145)
TCO2: 11 mmol/L — ABNORMAL LOW (ref 22–32)
pCO2, Ven: 25.3 mmHg — ABNORMAL LOW (ref 44.0–60.0)
pH, Ven: 7.199 — CL (ref 7.250–7.430)
pO2, Ven: 52 mmHg — ABNORMAL HIGH (ref 32.0–45.0)

## 2019-11-17 LAB — ACETAMINOPHEN LEVEL: Acetaminophen (Tylenol), Serum: 12 ug/mL (ref 10–30)

## 2019-11-17 LAB — LACTIC ACID, PLASMA: Lactic Acid, Venous: 3.3 mmol/L (ref 0.5–1.9)

## 2019-11-17 LAB — LIPASE, BLOOD: Lipase: 963 U/L — ABNORMAL HIGH (ref 11–51)

## 2019-11-17 LAB — I-STAT BETA HCG BLOOD, ED (MC, WL, AP ONLY): I-stat hCG, quantitative: <5 m[IU]/mL (ref <5)

## 2019-11-17 LAB — SALICYLATE LEVEL: Salicylate Lvl: <7 mg/dL — ABNORMAL LOW (ref 7.0–30.0)

## 2019-11-17 LAB — RESPIRATORY PANEL BY RT PCR (FLU A&B, COVID)
Influenza A by PCR: NEGATIVE
Influenza B by PCR: NEGATIVE
SARS Coronavirus 2 by RT PCR: NEGATIVE

## 2019-11-17 LAB — PHOSPHORUS: Phosphorus: 6.1 mg/dL — ABNORMAL HIGH (ref 2.5–4.6)

## 2019-11-17 LAB — MAGNESIUM: Magnesium: 2.2 mg/dL (ref 1.7–2.4)

## 2019-11-17 LAB — ETHANOL: Alcohol, Ethyl (B): <10 mg/dL (ref <10)

## 2019-11-17 MED ORDER — HEPARIN SODIUM (PORCINE) 5000 UNIT/ML IJ SOLN
5000.0000 [IU] | Freq: Three times a day (TID) | INTRAMUSCULAR | Status: DC
  Administered 2019-11-17 – 2019-11-22 (×13): 5000 [IU] via SUBCUTANEOUS
  Filled 2019-11-17 (×13): qty 1

## 2019-11-17 MED ORDER — ACETAMINOPHEN 650 MG RE SUPP: 650.0000 mg | Freq: Four times a day (QID) | RECTAL | Status: DC | PRN

## 2019-11-17 MED ORDER — ONDANSETRON HCL 4 MG PO TABS: 4.0000 mg | ORAL_TABLET | Freq: Four times a day (QID) | ORAL | Status: DC | PRN

## 2019-11-17 MED ORDER — LACTATED RINGERS IV SOLN: INTRAVENOUS | Status: AC

## 2019-11-17 MED ORDER — FOLIC ACID 1 MG PO TABS
1.0000 mg | ORAL_TABLET | Freq: Every day | ORAL | Status: DC
  Administered 2019-11-18 – 2019-11-22 (×5): 1 mg via ORAL
  Filled 2019-11-17 (×5): qty 1

## 2019-11-17 MED ORDER — ADULT MULTIVITAMIN W/MINERALS CH
1.0000 | ORAL_TABLET | Freq: Every day | ORAL | Status: DC
  Administered 2019-11-18 – 2019-11-20 (×3): 1 via ORAL
  Filled 2019-11-17 (×5): qty 1

## 2019-11-17 MED ORDER — SODIUM CHLORIDE 0.9 % IV BOLUS
1000.0000 mL | Freq: Once | INTRAVENOUS | Status: AC
  Administered 2019-11-17: 21:00:00 1000 mL via INTRAVENOUS

## 2019-11-17 MED ORDER — THIAMINE HCL 100 MG/ML IJ SOLN
100.0000 mg | Freq: Every day | INTRAMUSCULAR | Status: DC
  Administered 2019-11-17 – 2019-11-22 (×5): 100 mg via INTRAVENOUS
  Filled 2019-11-17 (×5): qty 2

## 2019-11-17 MED ORDER — SODIUM CHLORIDE 0.9 % IV BOLUS: 1000.0000 mL | Freq: Once | INTRAVENOUS | Status: DC

## 2019-11-17 MED ORDER — LABETALOL HCL 5 MG/ML IV SOLN
10.0000 mg | INTRAVENOUS | Status: DC | PRN
  Administered 2019-11-17 – 2019-11-20 (×5): 10 mg via INTRAVENOUS
  Filled 2019-11-17 (×5): qty 4

## 2019-11-17 MED ORDER — LORAZEPAM 1 MG PO TABS: 1.0000 mg | ORAL_TABLET | ORAL | Status: AC | PRN

## 2019-11-17 MED ORDER — ONDANSETRON HCL 4 MG/2ML IJ SOLN
4.0000 mg | Freq: Four times a day (QID) | INTRAMUSCULAR | Status: DC | PRN
  Administered 2019-11-17 – 2019-11-20 (×4): 4 mg via INTRAVENOUS
  Filled 2019-11-17 (×4): qty 2

## 2019-11-17 MED ORDER — SODIUM CHLORIDE 0.9 % IV BOLUS
1000.0000 mL | Freq: Once | INTRAVENOUS | Status: AC
  Administered 2019-11-17: 1000 mL via INTRAVENOUS

## 2019-11-17 MED ORDER — ACETAMINOPHEN 325 MG PO TABS
650.0000 mg | ORAL_TABLET | Freq: Four times a day (QID) | ORAL | Status: DC | PRN
  Administered 2019-11-19 – 2019-11-21 (×2): 650 mg via ORAL
  Filled 2019-11-17 (×2): qty 2

## 2019-11-17 MED ORDER — THIAMINE HCL 100 MG PO TABS
100.0000 mg | ORAL_TABLET | Freq: Every day | ORAL | Status: DC
  Administered 2019-11-18: 100 mg via ORAL
  Filled 2019-11-17 (×4): qty 1

## 2019-11-17 MED ORDER — LORAZEPAM 2 MG/ML IJ SOLN
0.0000 mg | Freq: Two times a day (BID) | INTRAMUSCULAR | Status: AC
  Administered 2019-11-19 – 2019-11-20 (×3): 2 mg via INTRAVENOUS
  Filled 2019-11-17 (×2): qty 1

## 2019-11-17 MED ORDER — MORPHINE SULFATE (PF) 2 MG/ML IV SOLN
1.0000 mg | INTRAVENOUS | Status: DC | PRN
  Administered 2019-11-17 – 2019-11-18 (×2): 1 mg via INTRAVENOUS
  Filled 2019-11-17 (×2): qty 1

## 2019-11-17 MED ORDER — LORAZEPAM 2 MG/ML IJ SOLN
0.0000 mg | Freq: Four times a day (QID) | INTRAMUSCULAR | Status: AC
  Administered 2019-11-17 – 2019-11-19 (×2): 1 mg via INTRAVENOUS
  Filled 2019-11-17 (×2): qty 1

## 2019-11-17 MED ORDER — LORAZEPAM 2 MG/ML IJ SOLN
1.0000 mg | INTRAMUSCULAR | Status: AC | PRN
  Filled 2019-11-17: qty 1

## 2019-11-17 NOTE — H&P (Addendum)
History and Physical    Donna Short HWE:[id number] DOB: 28-Mar-1991 DOA: 11/17/2019  PCP: Orma Flaming, MD  Patient coming from: Home.  Chief Complaint: Abdominal pain nausea vomiting.  HPI: Donna Short is a 29 y.o. female with history of alcohol abuse with recurrent alcohol induced pancreatitis, hypertension, seizures presents to the ER with complaints of having epigastric pain with recurrent episodes of vomiting over the last 24 hours.  Denies any diarrhea.  Denies any chest pain or shortness of breath.  Denies any fever chills.  Patient admits to drinking alcohol today.  ED Course: In the ER labs show elevated lactic acid bicarb of 7 potassium 5.6 glucose 220 AST 75 ALT 52 and lipase of 963.  WBC was 16.7 Covid test was negative.  Patient was depressed and had some suicidal ideation for which patient was placed on suicide precaution.  Acute abdominal series was negative.  Patient admitted for acute pancreatitis.  Review of Systems: As per HPI, rest all negative.   Past Medical History:  Diagnosis Date  . Asthma   . History of chicken pox   . Hypertension   . Migraines   . Pancreatitis 01/2018  . Seizures ([place])     Past Surgical History:  Procedure Laterality Date  . WISDOM TOOTH EXTRACTION       reports that she quit smoking about 21 months ago. Her smoking use included cigarettes. She has never used smokeless tobacco. She reports current alcohol use of about 9.0 standard drinks of alcohol per week. She reports that she does not use drugs.  Allergies  Allergen Reactions  . Claritin [Loratadine] Itching  . Peanut-Containing Drug Products Other (See Comments)  . Benadryl [Diphenhydramine Hcl (Sleep)] Rash  . Latex Rash  . Penicillins Itching and Rash    Has patient had a PCN reaction causing immediate rash, facial/tongue/throat swelling, SOB or lightheadedness with hypotension: Yes Has patient had a PCN reaction causing severe rash involving mucus membranes or  skin necrosis: No Has patient had a PCN reaction that required hospitalization: Yes Has patient had a PCN reaction occurring within the last 10 years: No If all of the above answers are "NO", then may proceed with Cephalosporin use.;  . Shrimp [Shellfish Allergy] Hives and Rash    Family History  Problem Relation Age of Onset  . Diabetes Father   . Arthritis Father   . Alcohol abuse Father   . Breast cancer Mother   . Asthma Mother   . Drug abuse Mother   . Hypertension Mother     Prior to Admission medications   Medication Sig Start Date End Date Taking? Authorizing Provider  acetaminophen (TYLENOL) 500 MG tablet Take 1,000 mg by mouth every 6 (six) hours as needed for mild pain.    [provider]  albuterol (VENTOLIN HFA) 108 (90 Base) MCG/ACT inhaler Inhale 1-2 puffs into the lungs every 6 (six) hours as needed for wheezing or shortness of breath. 02/08/19   Wieters, Hallie C, PA-C  baclofen (LIORESAL) 10 MG tablet Take 1 tablet (10 mg total) by mouth 3 (three) times daily. 08/25/19   Orma Flaming, MD  ibuprofen (ADVIL) 600 MG tablet Take 1 tablet (600 mg total) by mouth every 8 (eight) hours as needed. Patient taking differently: Take 600 mg by mouth every 8 (eight) hours as needed for moderate pain.  08/25/19   Orma Flaming, MD  levETIRAcetam (KEPPRA) 500 MG tablet Take one tablet twice daily - twelve hours apart.  PCP  will need to provide future refills. 08/11/19   Levert Feinstein, MD  lisinopril (ZESTRIL) 20 MG tablet Take 1 tablet (20 mg total) by mouth daily. 10/24/19   Orland Mustard, MD  metoprolol tartrate (LOPRESSOR) 50 MG tablet Take 1 tablet (50 mg total) by mouth 2 (two) times daily. 09/02/19 10/02/19  Margie Ege A, DO  potassium chloride SA (KLOR-CON) 20 MEQ tablet Take 1 tablet (20 mEq total) by mouth daily for 14 days. 09/02/19 09/16/19  Teddy Spike, DO    Physical Exam: Constitutional: Moderately built and nourished. Vitals:   11/17/19 1643 11/17/19 2041    BP: (!) 159/124 (!) 152/99  Pulse: (!) 141 96  Resp: 18 18  Temp: 98.7 F (37.1 C) 99.2 F (37.3 C)  TempSrc: Oral Oral  SpO2: 100% 100%   Eyes: Anicteric no pallor. ENMT: No discharge from the ears eyes nose or mouth. Neck: No mass felt.  No neck rigidity. Respiratory: No rhonchi or crepitations. Cardiovascular: S1-S2 heard. Abdomen: Soft nontender bowel sound present. Musculoskeletal: No edema. Skin: No rash. Neurologic: Alert awake oriented to time place and person.  Moves all extremities. Psychiatric: Appears normal.   Labs on Admission: I have personally reviewed following labs and imaging studies  CBC: Recent Labs  Lab 11/17/19 1715 11/17/19 2006  WBC 16.7*  --   HGB 14.6 17.0*  HCT 45.1 50.0*  MCV 111.6*  --   PLT 200  --    Basic Metabolic Panel: Recent Labs  Lab 11/17/19 1715 11/17/19 2002 11/17/19 2006  NA 138  --  133*  K 5.6*  --  6.0*  CL 93*  --   --   CO2 7*  --   --   GLUCOSE 212*  --   --   BUN 44*  --   --   CREATININE 2.22*  --   --   CALCIUM 9.4  --   --   MG  --  2.2  --   PHOS  --  6.1*  --    GFR: CrCl cannot be calculated (Unknown ideal weight.). Liver Function Tests: Recent Labs  Lab 11/17/19 1715  AST 75*  ALT 52*  ALKPHOS 51  BILITOT 2.1*  PROT 9.7*  ALBUMIN 4.9   Recent Labs  Lab 11/17/19 2002  LIPASE 963*   No results for input(s): AMMONIA in the last 168 hours. Coagulation Profile: No results for input(s): INR, PROTIME in the last 168 hours. Cardiac Enzymes: No results for input(s): CKTOTAL, CKMB, CKMBINDEX, TROPONINI in the last 168 hours. BNP (last 3 results) No results for input(s): PROBNP in the last 8760 hours. HbA1C: No results for input(s): HGBA1C in the last 72 hours. CBG: No results for input(s): GLUCAP in the last 168 hours. Lipid Profile: No results for input(s): CHOL, HDL, LDLCALC, TRIG, CHOLHDL, LDLDIRECT in the last 72 hours. Thyroid Function Tests: No results for input(s): TSH, T4TOTAL,  FREET4, T3FREE, THYROIDAB in the last 72 hours. Anemia Panel: No results for input(s): VITAMINB12, FOLATE, FERRITIN, TIBC, IRON, RETICCTPCT in the last 72 hours. Urine analysis:    Component Value Date/Time   COLORURINE YELLOW 08/27/2019 1322   APPEARANCEUR HAZY (A) 08/27/2019 1322   LABSPEC 1.024 08/27/2019 1322   PHURINE 6.0 08/27/2019 1322   GLUCOSEU NEGATIVE 08/27/2019 1322   HGBUR MODERATE (A) 08/27/2019 1322   BILIRUBINUR SMALL (A) 08/27/2019 1322   KETONESUR 80 (A) 08/27/2019 1322   PROTEINUR >=300 (A) 08/27/2019 1322   UROBILINOGEN 0.2 10/07/2015 1958  NITRITE NEGATIVE 08/27/2019 1322   LEUKOCYTESUR NEGATIVE 08/27/2019 1322   Sepsis Labs: @LABRCNTIP (procalcitonin:4,lacticidven:4) )No results found for this or any previous visit (from the past 240 hour(s)).   Radiological Exams on Admission: No results found.    Assessment/Plan Principal Problem:   Acute on chronic pancreatitis (HCC) Active Problems:   Benign essential HTN   ETOH abuse   Acute pancreatitis    1. 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 IV fluids pain relief medication.  Advised about quitting drinking alcohol. 2. Hypertensive urgency for which I have placed patient on as needed IV hydralazine. 3. History of seizures on Keppra which has been dosed with IV. 4. Acute renal failure with hyperkalemia likely from vomiting.  Hydrate and recheck metabolic panel. 5. Metabolic acidosis likely from dehydration.  Recheck metabolic panel after hydration. 6. Alcohol abuse presently on CIWA protocol.  Advised about quitting. 7. Depression with suicidal ideation on suicide precautions. 8. Hyperglycemia check hemoglobin A1c.  Since patient has acute pancreatitis with acute renal failure lactic acidosis will need close monitoring for any deterioration in inpatient status.  DVT prophylaxis: Heparin. Code Status: Full code. Family Communication: Discussed with  patient. Disposition Plan: Home. Consults called: None. Admission status: Inpatient.   MD [facility] Pager [phone].  If 7PM-7AM, please contact night-coverage [email or web address] Password [facility]  11/17/2019, 9:55 PM

## 2019-11-17 NOTE — ED Provider Notes (Signed)
[facility] EMERGENCY DEPARTMENT Provider Note   CSN: [id number] Arrival date & time: 11/17/19  1622     History Chief Complaint  Patient presents with  . Abdominal Pain    Donna Short is a 29 y.o. female.  29 year old female with prior medical history as detailed below presents for evaluation of nausea, vomiting, abdominal discomfort.  Patient reports significant alcohol binge 3 to 4 days prior.  She reports that after she stopped drinking she developed nausea, vomiting, and epigastric abdominal discomfort.  Symptoms described are consistent with prior episodes of pancreatitis.  She reports multiple episodes of vomiting over the course of today.  She denies fever.  She denies significant current abdominal pain.  Of note, patient denies current or active suicidal ideation or homicidal ideation.  The history is provided by the patient and medical records.  Illness Location:  Nausea, vomiting, epigastric abdominal discomfort Severity:  Moderate Onset quality:  Gradual Duration:  2 days Timing:  Constant Progression:  Worsening Chronicity:  Recurrent Associated symptoms: no fever        Past Medical History:  Diagnosis Date  . Asthma   . History of chicken pox   . Hypertension   . Migraines   . Pancreatitis 01/2018  . Seizures [facility])     Patient Active Problem List   Diagnosis Date Noted  . Acute on chronic pancreatitis (HCC) 02/24/2019  . Uncomplicated asthma   . Benign essential HTN   . ETOH abuse   . Seizures (HCC) 12/19/2015    Past Surgical History:  Procedure Laterality Date  . WISDOM TOOTH EXTRACTION       OB History   No obstetric history on file.     Family History  Problem Relation Age of Onset  . Diabetes Father   . Arthritis Father   . Alcohol abuse Father   . Breast cancer Mother   . Asthma Mother   . Drug abuse Mother   . Hypertension Mother     Social History   Tobacco Use  . Smoking status: Former Smoker      Types: Cigarettes    Quit date: 01/21/2018    Years since quitting: 1.8  . Smokeless tobacco: Never Used  Substance Use Topics  . Alcohol use: Yes    Alcohol/week: 9.0 standard drinks    Types: 9 Shots of liquor per week    Comment: 02/2019 "  2 shots every 3 or 4 days "  . Drug use: No    Home Medications Prior to Admission medications   Medication Sig Start Date End Date Taking? Authorizing Provider  acetaminophen (TYLENOL) 500 MG tablet Take 1,000 mg by mouth every 6 (six) hours as needed for mild pain.    [provider]  albuterol (VENTOLIN HFA) 108 (90 Base) MCG/ACT inhaler Inhale 1-2 puffs into the lungs every 6 (six) hours as needed for wheezing or shortness of breath. 02/08/19   Wieters, Hallie C, PA-C  baclofen (LIORESAL) 10 MG tablet Take 1 tablet (10 mg total) by mouth 3 (three) times daily. 08/25/19   Orland Mustard, MD  ibuprofen (ADVIL) 600 MG tablet Take 1 tablet (600 mg total) by mouth every 8 (eight) hours as needed. Patient taking differently: Take 600 mg by mouth every 8 (eight) hours as needed for moderate pain.  08/25/19   Orland Mustard, MD  levETIRAcetam (KEPPRA) 500 MG tablet Take one tablet twice daily - twelve hours apart.  PCP will need to provide future refills.  08/11/19   Levert Feinstein, MD  lisinopril (ZESTRIL) 20 MG tablet Take 1 tablet (20 mg total) by mouth daily. 10/24/19   Orland Mustard, MD  metoprolol tartrate (LOPRESSOR) 50 MG tablet Take 1 tablet (50 mg total) by mouth 2 (two) times daily. 09/02/19 10/02/19  Margie Ege A, DO  potassium chloride SA (KLOR-CON) 20 MEQ tablet Take 1 tablet (20 mEq total) by mouth daily for 14 days. 09/02/19 09/16/19  Margie Ege A, DO    Allergies    Claritin [loratadine], Peanut-containing drug products, Benadryl [diphenhydramine hcl (sleep)], Latex, Penicillins, and Shrimp [shellfish allergy]  Review of Systems   Review of Systems  Constitutional: Negative for fever.  All other systems reviewed and are  negative.   Physical Exam Updated Vital Signs BP (!) 152/99 (BP Location: Left Arm)   Pulse 96   Temp 99.2 F (37.3 C) (Oral)   Resp 18   SpO2 100%   Physical Exam Vitals and nursing note reviewed.  Constitutional:      General: She is not in acute distress.    Appearance: She is well-developed.  HENT:     Head: Normocephalic and atraumatic.  Eyes:     Conjunctiva/sclera: Conjunctivae normal.     Pupils: Pupils are equal, round, and reactive to light.  Cardiovascular:     Rate and Rhythm: Normal rate and regular rhythm.     Heart sounds: Normal heart sounds.  Pulmonary:     Effort: Pulmonary effort is normal. No respiratory distress.     Breath sounds: Normal breath sounds.  Abdominal:     General: There is no distension.     Palpations: Abdomen is soft.     Tenderness: There is abdominal tenderness in the epigastric area.     Comments: Mild tenderness with palpation over the epigastrium.  No rebound or guarding noted  Musculoskeletal:        General: No deformity. Normal range of motion.     Cervical back: Normal range of motion and neck supple.  Skin:    General: Skin is warm and dry.  Neurological:     Mental Status: She is alert and oriented to person, place, and time.     ED Results / Procedures / Treatments   Labs (all labs ordered are listed, but only abnormal results are displayed) Labs Reviewed  COMPREHENSIVE METABOLIC PANEL - Abnormal; Notable for the following components:      Result Value   Potassium 5.6 (*)    Chloride 93 (*)    CO2 7 (*)    Glucose, Bld 212 (*)    BUN 44 (*)    Creatinine, Ser 2.22 (*)    Total Protein 9.7 (*)    AST 75 (*)    ALT 52 (*)    Total Bilirubin 2.1 (*)    GFR calc non Af Amer 29 (*)    GFR calc Af Amer 33 (*)    Anion gap 38 (*)    All other components within normal limits  SALICYLATE LEVEL - Abnormal; Notable for the following components:   Salicylate Lvl <7.0 (*)    All other components within normal limits   CBC - Abnormal; Notable for the following components:   WBC 16.7 (*)    MCV 111.6 (*)    MCH 36.1 (*)    RDW 11.4 (*)    All other components within normal limits  LACTIC ACID, PLASMA - Abnormal; Notable for the following components:   Lactic Acid, Venous  3.3 (*)    All other components within normal limits  PHOSPHORUS - Abnormal; Notable for the following components:   Phosphorus 6.1 (*)    All other components within normal limits  LIPASE, BLOOD - Abnormal; Notable for the following components:   Lipase 963 (*)    All other components within normal limits  POCT I-STAT EG7 - Abnormal; Notable for the following components:   pH, Ven 7.199 (*)    pCO2, Ven 25.3 (*)    pO2, Ven 52.0 (*)    Bicarbonate 9.9 (*)    TCO2 11 (*)    Acid-base deficit 16.0 (*)    Sodium 133 (*)    Potassium 6.0 (*)    Calcium, Ion 1.04 (*)    HCT 50.0 (*)    Hemoglobin 17.0 (*)    All other components within normal limits  RESPIRATORY PANEL BY RT PCR (FLU A&B, COVID)  ETHANOL  ACETAMINOPHEN LEVEL  MAGNESIUM  RAPID URINE DRUG SCREEN, HOSP PERFORMED  LACTIC ACID, PLASMA  BLOOD GAS, VENOUS  I-STAT BETA HCG BLOOD, ED ([facility], [facility], [facility] ONLY)    EKG None  Radiology No results found.  Procedures Procedures (including critical care time)  Medications Ordered in ED Medications  sodium chloride 0.9 % bolus 1,000 mL (1,000 mLs Intravenous New Bag/Given 11/17/19 2029)  sodium chloride 0.9 % bolus 1,000 mL (1,000 mLs Intravenous New Bag/Given 11/17/19 2030)    ED Course  I have reviewed the triage vital signs and the nursing notes.  Pertinent labs & imaging results that were available during my care of the patient were reviewed by me and considered in my medical decision making (see chart for details).    MDM Rules/Calculators/A&P                      MDM  Screen complete  Donna Short was evaluated in Emergency Department on 11/17/2019 for the symptoms described in the history of present  illness. She was evaluated in the context of the global COVID-19 pandemic, which necessitated consideration that the patient might be at risk for infection with the SARS-CoV-2 virus that causes COVID-19. Institutional protocols and algorithms that pertain to the evaluation of patients at risk for COVID-19 are in a state of rapid change based on information released by regulatory bodies including the CDC and federal and state organizations. These policies and algorithms were followed during the patient's care in the ED.    Patient is presenting for evaluation of nausea, vomiting, and abdominal discomfort.  Patient's symptoms were precipitated by recent alcohol binge drinking episode.  Her presentation is consistent with likely pancreatitis with concurrent AKI.  Lab findings support this with an elevated lipase and elevated creatinine.  Patient denies any current psychiatric complaints that would require IVC.  She is not an active threat to herself or others.  Patient will require admission for further work-up and treatment as an inpatient.  Hospitalist services is aware of case and will evaluate for admission.     Final Clinical Impression(s) / ED Diagnoses Final diagnoses:  Acute pancreatitis, unspecified complication status, unspecified pancreatitis type  Dehydration    Rx / DC Orders ED Discharge Orders    None       Valarie Merino, MD 11/17/19 2137

## 2019-11-17 NOTE — ED Triage Notes (Signed)
Pt arrives to ED with c/o of abd pain after "trying to drink myself to death 2 days ago". Pt actively vomiting in triage

## 2019-11-18 ENCOUNTER — Inpatient Hospital Stay (HOSPITAL_COMMUNITY): Payer: Managed Care, Other (non HMO)

## 2019-11-18 ENCOUNTER — Other Ambulatory Visit: Payer: Self-pay

## 2019-11-18 LAB — BASIC METABOLIC PANEL
Anion gap: 18 — ABNORMAL HIGH (ref 5–15)
BUN: 34 mg/dL — ABNORMAL HIGH (ref 6–20)
CO2: 17 mmol/L — ABNORMAL LOW (ref 22–32)
Calcium: 8.4 mg/dL — ABNORMAL LOW (ref 8.9–10.3)
Chloride: 104 mmol/L (ref 98–111)
Creatinine, Ser: 1.24 mg/dL — ABNORMAL HIGH (ref 0.44–1.00)
GFR calc Af Amer: >60 mL/min (ref >60)
GFR calc non Af Amer: 58 mL/min — ABNORMAL LOW (ref >60)
Glucose, Bld: 123 mg/dL — ABNORMAL HIGH (ref 70–99)
Potassium: 4.6 mmol/L (ref 3.5–5.1)
Sodium: 139 mmol/L (ref 135–145)

## 2019-11-18 LAB — CBC WITH DIFFERENTIAL/PLATELET
Abs Immature Granulocytes: 0.03 10*3/uL (ref 0.00–0.07)
Basophils Absolute: 0 10*3/uL (ref 0.0–0.1)
Basophils Relative: 0 %
Eosinophils Absolute: 0 10*3/uL (ref 0.0–0.5)
Eosinophils Relative: 0 %
HCT: 36.8 % (ref 36.0–46.0)
Hemoglobin: 12.4 g/dL (ref 12.0–15.0)
Immature Granulocytes: 0 %
Lymphocytes Relative: 11 %
Lymphs Abs: 1.2 10*3/uL (ref 0.7–4.0)
MCH: 36 pg — ABNORMAL HIGH (ref 26.0–34.0)
MCHC: 33.7 g/dL (ref 30.0–36.0)
MCV: 107 fL — ABNORMAL HIGH (ref 80.0–100.0)
Monocytes Absolute: 1 10*3/uL (ref 0.1–1.0)
Monocytes Relative: 10 %
Neutro Abs: 7.9 10*3/uL — ABNORMAL HIGH (ref 1.7–7.7)
Neutrophils Relative %: 79 %
Platelets: 122 10*3/uL — ABNORMAL LOW (ref 150–400)
RBC: 3.44 MIL/uL — ABNORMAL LOW (ref 3.87–5.11)
RDW: 11.5 % (ref 11.5–15.5)
WBC: 10.1 10*3/uL (ref 4.0–10.5)
nRBC: 0 % (ref 0.0–0.2)

## 2019-11-18 LAB — GLUCOSE, CAPILLARY
Glucose-Capillary: 108 mg/dL — ABNORMAL HIGH (ref 70–99)
Glucose-Capillary: 86 mg/dL (ref 70–99)

## 2019-11-18 LAB — HEPATIC FUNCTION PANEL
ALT: 36 U/L (ref 0–44)
AST: 47 U/L — ABNORMAL HIGH (ref 15–41)
Albumin: 3.8 g/dL (ref 3.5–5.0)
Alkaline Phosphatase: 37 U/L — ABNORMAL LOW (ref 38–126)
Bilirubin, Direct: <0.1 mg/dL (ref 0.0–0.2)
Total Bilirubin: 1.5 mg/dL — ABNORMAL HIGH (ref 0.3–1.2)
Total Protein: 7.2 g/dL (ref 6.5–8.1)

## 2019-11-18 LAB — CBG MONITORING, ED: Glucose-Capillary: 137 mg/dL — ABNORMAL HIGH (ref 70–99)

## 2019-11-18 LAB — HEMOGLOBIN A1C
Hgb A1c MFr Bld: 5.2 % (ref 4.8–5.6)
Mean Plasma Glucose: 102.54 mg/dL

## 2019-11-18 LAB — LACTIC ACID, PLASMA: Lactic Acid, Venous: 1 mmol/L (ref 0.5–1.9)

## 2019-11-18 MED ORDER — METOPROLOL TARTRATE 50 MG PO TABS
50.0000 mg | ORAL_TABLET | Freq: Two times a day (BID) | ORAL | Status: DC
  Administered 2019-11-18 – 2019-11-20 (×6): 50 mg via ORAL
  Filled 2019-11-18 (×6): qty 1

## 2019-11-18 MED ORDER — LEVETIRACETAM IN NACL 500 MG/100ML IV SOLN
500.0000 mg | Freq: Two times a day (BID) | INTRAVENOUS | Status: DC
  Administered 2019-11-18 – 2019-11-19 (×3): 500 mg via INTRAVENOUS
  Filled 2019-11-18 (×5): qty 100

## 2019-11-18 MED ORDER — PANTOPRAZOLE SODIUM 40 MG IV SOLR
40.0000 mg | INTRAVENOUS | Status: DC
  Administered 2019-11-18 – 2019-11-20 (×3): 40 mg via INTRAVENOUS
  Filled 2019-11-18 (×3): qty 40

## 2019-11-18 NOTE — Progress Notes (Signed)
PROGRESS NOTE    Donna Short  SAY:[id number] DOB: [birth year] DOA: 11/17/2019 PCP: Orma Flaming, MD   Brief Narrative: 29 year old female with history of alcohol abuse with recurrent alcohol induced pancreatitis, hypertension, seizure disorder presents to the ER with epigastric pain and associated recurrent episodes of vomiting x24 hours.  In the ED labs showed elevated lactic acid, low bicarb hyperkalemia renal failure elevated LFTs hyperglycemia, leukocytosis.  Discharge negative, acute abdomen series was negative, patient also endorsed suicidal ideation.  Patient was aggressively hydrated with IV fluid and was admitted, placed on suicide precaution.  Subjective:  Alert,awake, some nausea and abd pain. Wants to try LCD, report had acid refulx yesterday Overnight afebrile but hypertensive, tachycardic, needing IV morphine, IV labetalol. Labs this morning showed significant improvement in her acidosis hyperkalemia renal failure and leukocytosis. Sitter at bedside. Reports she was stressed/upset when she made comment about suicidal ideation. FEELS FINE NOW. had BM x3 during night-not loose.  Assessment & Plan:  Acute on chronic pancreatitis: Current admission for similar presentation.  Continue on current conservative management with n.p.o., aggressive IV fluids, electrolyte replacement, antiemetics and pain control. Monitor lfts lipase.  Elevated LFTs/alcoholic hepatitis:improving with hydration monitor. Monitor. Recent Labs  Lab 11/17/19 1715 11/18/19 0633  AST 75* 47*  ALT 52* 36  ALKPHOS 51 37*  BILITOT 2.1* 1.5*  PROT 9.7* 7.2  ALBUMIN 4.9 3.8   Acute kidney injury/hyperkalemia: Likely prerenal and secondary to #1.  Potassium normalized, creatinine improving. Recent Labs  Lab 11/17/19 1715 11/18/19 0633  BUN 44* 34*  CREATININE 2.22* 1.24*   Lactic acidosis/metabolic acidosis anion gap due to #1, lactic acidosis resolved.  Bicarb is still low at 17, continue on  aggressive IV fluids  Hypertension urgency/benign essential HTN: Poorly controlled continue as needed medication.  Resume oral metoprolol hold lisinopril due to AKI.  ETOH abuse-cessation counselling.  Continue thiamine vitamins.  Depression and suicide ideation on one-to-one suicide precaution.  Will consult psychiatry. Denies hx of depression. Hopefully we can d/c one to one soon.  History of seizure disorder continue home Keppra IV  Hyperglycemia likely reactive.  Hemoglobin A1c is normal t 5.2  Nutrition: Diet Order            Diet NPO time specified Except for: Sips with Meds, Ice Chips  Diet effective now                    Body mass index is 22.7 kg/m.  Pressure Ulcer:    DVT prophylaxis:Heparin Code Status:full  Family Communication: plan of care discussed with patient at bedside. Disposition Plan: Patient is from:home Anticipated Disposition: to  Home, in- TBD Barriers to discharge or conditions that needs to be met prior to discharge: Patient admitted with acute pancreatitis suicidal ideation and remains hospitalized for ongoing medical management, psych eval and alcohol abuse.  Consultants: psych Procedures:see note Microbiology:see note   Medications: Scheduled Meds: . folic acid  1 mg Oral Daily  . heparin  5,000 Units Subcutaneous Q8H  . LORazepam  0-4 mg Intravenous Q6H   Followed by  . [START ON 11/19/2019] LORazepam  0-4 mg Intravenous Q12H  . multivitamin with minerals  1 tablet Oral Daily  . thiamine  100 mg Oral Daily   Or  . thiamine  100 mg Intravenous Daily   Continuous Infusions: . lactated ringers Stopped (11/18/19 0309)  . levETIRAcetam Stopped (11/18/19 0330)  . sodium chloride      Antimicrobials: Anti-infectives (From admission, onward)  None       Objective: Vitals: Today's Vitals   11/18/19 0132 11/18/19 0255 11/18/19 0658 11/18/19 0700  BP: (!) 162/112 (!) 168/114 (!) 151/113   Pulse: (!) 109 (!) 113 (!) 107     Resp: _0 Temp:      TempSrc:      SpO2: 100% 99% 100%   Weight:    56.3 kg  Height:    _1  (1.575 m)  PainSc:        Intake/Output Summary (Last 24 hours) at 11/18/2019 0853 Last data filed at 11/18/2019 0330 Gross per 24 hour  Intake 2100 ml  Output --  Net 2100 ml   Filed Weights   11/18/19 0700  Weight: 56.3 kg   Weight change:    Intake/Output from previous day: 03/25 0701 - 03/26 0700 In: 2100 [IV [id number]] Out: -  Intake/Output this shift: No intake/output data recorded.  Examination: General exam:AAOx3,NAD,weak appearing. HEENT:Oral mucosa moist, Ear/Nose WNL grossly,dentition normal. Respiratory system: bilaterally clear,no wheezing or crackles,no use of accessory muscle, non tender. Cardiovascular system: S1 & S2 +, regular, No JVD. Gastrointestinal system: Abdomen soft,Tender, on mid abdomen, ND, BS+. Nervous System:Alert, awake, moving extremities and grossly nonfocal Extremities: No edema, distal peripheral pulses palpable.  Skin: No rashes,no icterus. MSK: Normal muscle bulk,tone, power  Data Reviewed: I have personally reviewed following labs and imaging studies CBC: Recent Labs  Lab 11/17/19 1715 11/17/19 2006 11/18/19 0633  WBC 16.7*  --  10.1  NEUTROABS  --   --  7.9*  HGB 14.6 17.0* 12.4  HCT 45.1 50.0* 36.8  MCV 111.6*  --  107.0*  PLT 200  --  119*   Basic Metabolic Panel: Recent Labs  Lab 11/17/19 1715 11/17/19 2002 11/17/19 2006 11/18/19 0633  NA 138  --  133* 139  K 5.6*  --  6.0* 4.6  CL 93*  --   --  104  CO2 7*  --   --  17*  GLUCOSE 212*  --   --  123*  BUN 44*  --   --  34*  CREATININE 2.22*  --   --  1.24*  CALCIUM 9.4  --   --  8.4*  MG  --  2.2  --   --   PHOS  --  6.1*  --   --    GFR: Estimated Creatinine Clearance: 52.5 mL/min (A) (by C-G formula based on SCr of 1.24 mg/dL (H)). Liver Function Tests: Recent Labs  Lab 11/17/19 1715 11/18/19 0633  AST 75* 47*  ALT 52* 36  ALKPHOS 51 37*   BILITOT 2.1* 1.5*  PROT 9.7* 7.2  ALBUMIN 4.9 3.8   Recent Labs  Lab 11/17/19 2002  LIPASE 963*   No results for input(s): AMMONIA in the last 168 hours. Coagulation Profile: No results for input(s): INR, PROTIME in the last 168 hours. Cardiac Enzymes: No results for input(s): CKTOTAL, CKMB, CKMBINDEX, TROPONINI in the last 168 hours. BNP (last 3 results) No results for input(s): PROBNP in the last 8760 hours. HbA1C: Recent Labs    11/18/19 0633  HGBA1C 5.2   CBG: Recent Labs  Lab 11/18/19 0136  GLUCAP 137*   Lipid Profile: No results for input(s): CHOL, HDL, LDLCALC, TRIG, CHOLHDL, LDLDIRECT in the last 72 hours. Thyroid Function Tests: No results for input(s): TSH, T4TOTAL, FREET4, T3FREE, THYROIDAB in the last 72 hours. Anemia Panel: No results for input(s): VITAMINB12, FOLATE, FERRITIN, TIBC, IRON, RETICCTPCT  in the last 72 hours. Sepsis Labs: Recent Labs  Lab 11/17/19 2002 11/18/19 2979  LATICACIDVEN 3.3* 1.0    Recent Results (from the past 240 hour(s))  Respiratory Panel by RT PCR (Flu A&B, Covid) - Nasopharyngeal Swab     Status: None   Collection Time: 11/17/19  9:43 PM   Specimen: Nasopharyngeal Swab  Result Value Ref Range Status   SARS Coronavirus 2 by RT PCR NEGATIVE NEGATIVE Final    Comment: (NOTE) SARS-CoV-2 target nucleic acids are NOT DETECTED. The SARS-CoV-2 RNA is generally detectable in upper respiratoy specimens during the acute phase of infection. The lowest concentration of SARS-CoV-2 viral copies this assay can detect is 131 copies/mL. A negative result does not preclude SARS-Cov-2 infection and should not be used as the sole basis for treatment or other patient management decisions. A negative result may occur with  improper specimen collection/handling, submission of specimen other than nasopharyngeal swab, presence of viral mutation(s) within the areas targeted by this assay, and inadequate number of viral copies (<131  copies/mL). A negative result must be combined with clinical observations, patient history, and epidemiological information. The expected result is Negative. Fact Sheet for Patients:  PinkCheek.be Fact Sheet for Healthcare Providers:  GravelBags.it This test is not yet ap proved or cleared by the [place] FDA and  has been authorized for detection and/or diagnosis of SARS-CoV-2 by FDA under an Emergency Use Authorization (EUA). This EUA will remain  in effect (meaning this test can be used) for the duration of the COVID-19 declaration under Section 564(b)(1) of the Act, 21 U.S.C. section 360bbb-3(b)(1), unless the authorization is terminated or revoked sooner.    Influenza A by PCR NEGATIVE NEGATIVE Final   Influenza B by PCR NEGATIVE NEGATIVE Final    Comment: (NOTE) The Xpert Xpress SARS-CoV-2/FLU/RSV assay is intended as an aid in  the diagnosis of influenza from Nasopharyngeal swab specimens and  should not be used as a sole basis for treatment. Nasal washings and  aspirates are unacceptable for Xpert Xpress SARS-CoV-2/FLU/RSV  testing. Fact Sheet for Patients: PinkCheek.be Fact Sheet for Healthcare Providers: GravelBags.it This test is not yet approved or cleared by the [place] FDA and  has been authorized for detection and/or diagnosis of SARS-CoV-2 by  FDA under an Emergency Use Authorization (EUA). This EUA will remain  in effect (meaning this test can be used) for the duration of the  Covid-19 declaration under Section 564(b)(1) of the Act, 21  U.S.C. section 360bbb-3(b)(1), unless the authorization is  terminated or revoked. Performed at [facility], [place] [address] [place], [place] [address]       Radiology Studies: DG ABD ACUTE 2+V W 1V CHEST  Result Date: 11/18/2019 CLINICAL DATA:  Abdominal pain EXAM: DG ABDOMEN ACUTE W/ 1V CHEST  COMPARISON:  02/24/2019 FINDINGS: There is no evidence of dilated bowel loops or free intraperitoneal air. No radiopaque calculi or other significant radiographic abnormality is seen. Heart size and mediastinal contours are within normal limits. Both lungs are clear. IMPRESSION: Negative abdominal radiographs.  No acute cardiopulmonary disease. Electronically Signed   By: Rolm Baptise M.D.   On: 11/18/2019 02:38     LOS: 1 day   Time spent: More than 50% of that time was spent in counseling and/or coordination of care.  Antonieta Pert, MD [facility]  11/18/2019, 8:53 AM

## 2019-11-18 NOTE — Consult Note (Signed)
[facility] Face-to-Face Psychiatry Consult   Reason for Consult:  "suicidal ideation, etoh abuse" Referring Physician:  Dr Dayna Barker Patient Identification: Donna Short MRN:  [id number] Principal Diagnosis: Acute on chronic pancreatitis [facility]) Diagnosis:  Principal Problem:   Acute on chronic pancreatitis (HCC) Active Problems:   Benign essential HTN   ETOH abuse   Acute pancreatitis   Total Time spent with patient: 30 minutes  Subjective:   Donna Short is a 29 y.o. female patient.  Patient assessed by nurse practitioner.  Patient resting upon my approach, sitter present at bedside. Patient alert and oriented, answers appropriately. Patient states "I'm feeling better." Patient denies suicidal ideations. Patient reports "I had thoughts of suicide on Sunday night for a couple of hours." Patient denies plan or intent. Patient denies history of self-harm, denies history of suicide attempt.  Patient states "praying is what gets me through a rough day, so much good has happened to me in my life I would not kill myself and let myself down." Patient denies homicidal ideations.  Patient denies auditory visual hallucinations.  Patient denies symptoms of paranoia. Patient reports sleep and appetite are "good." Patient reports use of vodka every day, patient reports approximately 2 drinks of vodka per day.  Patient reports sober since January 2021 but began drinking again approximately 1 week ago.  Patient reports plan to abstain from alcohol use once discharged from hospital.  Patient reports first drink age 85.  Patient denies substance use aside from alcohol.  Patient reports she lives alone in [place].  Patient currently employed at [employer], [place].  Patient denies access to weapons.  Patient reports supportive parents as well as Farley Ly phone number [phone].  Patient gives verbal consent to speak with [name] for collateral information.  Attempted to  telephone Mr. Elige Radon for collateral, HIPAA compliant voicemail completed. Discussed medication options with patient, patient agrees with plan to add medication to address alcohol use as well as depressed mood.  Patient denies any substance use treatment in the past.  Patient will consider outpatient substance use treatment once discharged.  Patient currently has talk therapist through her insurer, patient plans to continue outpatient talk therapy with existing therapist.  Patient agrees with plan to follow-up with outpatient psychiatrist.     HPI:  Admitted with complaints of abdominal pain, nausea and vomiting.  Past Psychiatric History: Alcohol Use Disorder  Risk to Self:  Denies Risk to Others:  Denies Prior Inpatient Therapy:  No Prior Outpatient Therapy:  Yes  Past Medical History:  Past Medical History:  Diagnosis Date  . Asthma   . History of chicken pox   . Hypertension   . Migraines   . Pancreatitis 01/2018  . Seizures (HCC)     Past Surgical History:  Procedure Laterality Date  . WISDOM TOOTH EXTRACTION     Family History:  Family History  Problem Relation Age of Onset  . Diabetes Father   . Arthritis Father   . Alcohol abuse Father   . Breast cancer Mother   . Asthma Mother   . Drug abuse Mother   . Hypertension Mother    Family Psychiatric  History: Mother-substance use disorder Social History:  Social History   Substance and Sexual Activity  Alcohol Use Yes  . Alcohol/week: 9.0 standard drinks  . Types: 9 Shots of liquor per week   Comment: 02/2019 "  2 shots every 3 or 4 days "     Social History   Substance  and Sexual Activity  Drug Use No    Social History   Socioeconomic History  . Marital status: Single    Spouse name: Not on file  . Number of children: 0  . Years of education: 2 years college  . Highest education level: Not on file  Occupational History  . Occupation: Deli at [employer]  . Smoking status: Former  Smoker    Types: Cigarettes    Quit date: 01/21/2018    Years since quitting: 1.8  . Smokeless tobacco: Never Used  Substance and Sexual Activity  . Alcohol use: Yes    Alcohol/week: 9.0 standard drinks    Types: 9 Shots of liquor per week    Comment: 02/2019 "  2 shots every 3 or 4 days "  . Drug use: No  . Sexual activity: Not Currently  Other Topics Concern  . Not on file  Social History Narrative   Lives at home alone.   Right-handed.   No caffeine use.   Social Determinants of Health   Financial Resource Strain:   . Difficulty of Paying Living Expenses:   Food Insecurity:   . Worried About Programme researcher, broadcasting/film/video in the Last Year:   . Barista in the Last Year:   Transportation Needs:   . Freight forwarder (Medical):   Marland Kitchen Lack of Transportation (Non-Medical):   Physical Activity:   . Days of Exercise per Week:   . Minutes of Exercise per Session:   Stress:   . Feeling of Stress :   Social Connections:   . Frequency of Communication with Friends and Family:   . Frequency of Social Gatherings with Friends and Family:   . Attends Religious Services:   . Active Member of Clubs or Organizations:   . Attends Banker Meetings:   Marland Kitchen Marital Status:    Additional Social History:    Allergies:   Allergies  Allergen Reactions  . Claritin [Loratadine] Itching  . Peanut-Containing Drug Products Other (See Comments)  . Benadryl [Diphenhydramine Hcl (Sleep)] Rash  . Latex Rash  . Penicillins Itching and Rash    Has patient had a PCN reaction causing immediate rash, facial/tongue/throat swelling, SOB or lightheadedness with hypotension: Yes Has patient had a PCN reaction causing severe rash involving mucus membranes or skin necrosis: No Has patient had a PCN reaction that required hospitalization: Yes Has patient had a PCN reaction occurring within the last 10 years: No If all of the above answers are "NO", then may proceed with Cephalosporin use.;  .  Shrimp [Shellfish Allergy] Hives and Rash    Labs:  Results for orders placed or performed during the hospital encounter of 11/17/19 (from the past 48 hour(s))  Ethanol     Status: None   Collection Time: 11/17/19  5:00 PM  Result Value Ref Range   Alcohol, Ethyl (B) <10 <10 mg/dL    Comment: (NOTE) Lowest detectable limit for serum alcohol is 10 mg/dL. For medical purposes only. Performed at [facility], [address] [place], [place] [address]   Salicylate level     Status: Abnormal   Collection Time: 11/17/19  5:00 PM  Result Value Ref Range   Salicylate Lvl <7.0 (L) 7.0 - 30.0 mg/dL    Comment: Performed at [facility], [address] [place], [place] [address]  Acetaminophen level     Status: None   Collection Time: 11/17/19  5:00 PM  Result Value Ref Range   Acetaminophen (Tylenol), Serum 12 10 - 30 ug/mL    Comment: (NOTE) Therapeutic concentrations vary significantly. A range of 10-30 ug/mL  may be an effective concentration for many patients. However, some  are best treated at concentrations outside of this range. Acetaminophen concentrations >150 ug/mL at 4 hours after ingestion  and >50 ug/mL at 12 hours after ingestion are often associated with  toxic reactions. Performed at [facility], [place] [address] [place], [place] [address]   I-Stat beta hCG blood, ED     Status: None   Collection Time: 11/17/19  5:07 PM  Result Value Ref Range   I-stat hCG, quantitative <5.0 <5 mIU/mL   Comment 3            Comment:   GEST. AGE      CONC.  (mIU/mL)   <=1 WEEK        5 - 50     2 WEEKS       50 - 500     3 WEEKS       100 - 10,000     4 WEEKS     1,000 - 30,000        FEMALE AND NON-PREGNANT FEMALE:     LESS THAN 5 mIU/mL   Comprehensive metabolic panel     Status: Abnormal   Collection Time: 11/17/19  5:15 PM  Result Value Ref Range   Sodium 138 135 - 145 mmol/L   Potassium 5.6 (H) 3.5 - 5.1 mmol/L   Chloride 93 (L) 98 - 111 mmol/L   CO2 7  (L) 22 - 32 mmol/L   Glucose, Bld 212 (H) 70 - 99 mg/dL    Comment: Glucose reference range applies only to samples taken after fasting for at least 8 hours.   BUN 44 (H) 6 - 20 mg/dL   Creatinine, Ser 2.22 (H) 0.44 - 1.00 mg/dL   Calcium 9.4 8.9 - 10.3 mg/dL   Total Protein 9.7 (H) 6.5 - 8.1 g/dL   Albumin 4.9 3.5 - 5.0 g/dL   AST 75 (H) 15 - 41 U/L   ALT 52 (H) 0 - 44 U/L   Alkaline Phosphatase 51 38 - 126 U/L   Total Bilirubin 2.1 (H) 0.3 - 1.2 mg/dL   GFR calc non Af Amer 29 (L) >60 mL/min   GFR calc Af Amer 33 (L) >60 mL/min   Anion gap 38 (H) 5 - 15    Comment: Performed at [facility], [place] [address] [place], [place] [address]  cbc     Status: Abnormal   Collection Time: 11/17/19  5:15 PM  Result Value Ref Range   WBC 16.7 (H) 4.0 - 10.5 K/uL   RBC 4.04 3.87 - 5.11 MIL/uL   Hemoglobin 14.6 12.0 - 15.0 g/dL   HCT 45.1 36.0 - 46.0 %   MCV 111.6 (H) 80.0 - 100.0 fL   MCH 36.1 (H) 26.0 - 34.0 pg   MCHC 32.4 30.0 - 36.0 g/dL   RDW 11.4 (L) 11.5 - 15.5 %   Platelets 200 150 - 400 K/uL   nRBC 0.0 0.0 - 0.2 %    Comment: Performed at [place] [address] [place], [place] [address]  Lactic acid, plasma     Status: Abnormal   Collection Time: 11/17/19  8:02 PM  Result Value Ref Range   Lactic Acid, Venous 3.3 (HH) 0.5 - 1.9 mmol/L  Comment: CRITICAL RESULT CALLED TO, READ BACK BY AND VERIFIED WITH: JOHNSTON P,RN 11/17/19 2056 WAYK Performed at [facility], [address], [place], [place] [address]   Magnesium     Status: None   Collection Time: 11/17/19  8:02 PM  Result Value Ref Range   Magnesium 2.2 1.7 - 2.4 mg/dL    Comment: Performed at [facility], [address] [place], [place] [address]  Phosphorus     Status: Abnormal   Collection Time: 11/17/19  8:02 PM  Result Value Ref Range   Phosphorus 6.1 (H) 2.5 - 4.6 mg/dL    Comment: Performed at [facility], [address] [place], [place] [address]  Lipase, blood      Status: Abnormal   Collection Time: 11/17/19  8:02 PM  Result Value Ref Range   Lipase 963 (H) 11 - 51 U/L    Comment: RESULTS CONFIRMED BY MANUAL DILUTION Performed at [facility], [address] [place], [place] [address]   POCT I-Stat EG7     Status: Abnormal   Collection Time: 11/17/19  8:06 PM  Result Value Ref Range   pH, Ven 7.199 (LL) 7.250 - 7.430   pCO2, Ven 25.3 (L) 44.0 - 60.0 mmHg   pO2, Ven 52.0 (H) 32.0 - 45.0 mmHg   Bicarbonate 9.9 (L) 20.0 - 28.0 mmol/L   TCO2 11 (L) 22 - 32 mmol/L   O2 Saturation 79.0 %   Acid-base deficit 16.0 (H) 0.0 - 2.0 mmol/L   Sodium 133 (L) 135 - 145 mmol/L   Potassium 6.0 (H) 3.5 - 5.1 mmol/L   Calcium, Ion 1.04 (L) 1.15 - 1.40 mmol/L   HCT 50.0 (H) 36.0 - 46.0 %   Hemoglobin 17.0 (H) 12.0 - 15.0 g/dL   Patient temperature HIDE    Sample type VENOUS   Respiratory Panel by RT PCR (Flu A&B, Covid) - Nasopharyngeal Swab     Status: None   Collection Time: 11/17/19  9:43 PM   Specimen: Nasopharyngeal Swab  Result Value Ref Range   SARS Coronavirus 2 by RT PCR NEGATIVE NEGATIVE    Comment: (NOTE) SARS-CoV-2 target nucleic acids are NOT DETECTED. The SARS-CoV-2 RNA is generally detectable in upper respiratoy specimens during the acute phase of infection. The lowest concentration of SARS-CoV-2 viral copies this assay can detect is 131 copies/mL. A negative result does not preclude SARS-Cov-2 infection and should not be used as the sole basis for treatment or other patient management decisions. A negative result may occur with  improper specimen collection/handling, submission of specimen other than nasopharyngeal swab, presence of viral mutation(s) within the areas targeted by this assay, and inadequate number of viral copies (<131 copies/mL). A negative result must be combined with clinical observations, patient history, and epidemiological information. The expected result is Negative. Fact Sheet for Patients:   https://www.moore.com/ Fact Sheet for Healthcare Providers:  https://www.young.biz/ This test is not yet ap proved or cleared by the [place] FDA and  has been authorized for detection and/or diagnosis of SARS-CoV-2 by FDA under an Emergency Use Authorization (EUA). This EUA will remain  in effect (meaning this test can be used) for the duration of the COVID-19 declaration under Section 564(b)(1) of the Act, 21 U.S.C. section 360bbb-3(b)(1), unless the authorization is terminated or revoked sooner.    Influenza A by PCR NEGATIVE NEGATIVE   Influenza B by PCR NEGATIVE NEGATIVE    Comment: (NOTE) The Xpert Xpress SARS-CoV-2/FLU/RSV  assay is intended as an aid in  the diagnosis of influenza from Nasopharyngeal swab specimens and  should not be used as a sole basis for treatment. Nasal washings and  aspirates are unacceptable for Xpert Xpress SARS-CoV-2/FLU/RSV  testing. Fact Sheet for Patients: https://www.moore.com/ Fact Sheet for Healthcare Providers: https://www.young.biz/ This test is not yet approved or cleared by the [place] FDA and  has been authorized for detection and/or diagnosis of SARS-CoV-2 by  FDA under an Emergency Use Authorization (EUA). This EUA will remain  in effect (meaning this test can be used) for the duration of the  Covid-19 declaration under Section 564(b)(1) of the Act, 21  U.S.C. section 360bbb-3(b)(1), unless the authorization is  terminated or revoked. Performed at [facility], [address] [place], [place] [address]   CBG monitoring, ED     Status: Abnormal   Collection Time: 11/18/19  1:36 AM  Result Value Ref Range   Glucose-Capillary 137 (H) 70 - 99 mg/dL    Comment: Glucose reference range applies only to samples taken after fasting for at least 8 hours.  Hepatic function panel     Status: Abnormal   Collection Time: 11/18/19  6:33 AM  Result  Value Ref Range   Total Protein 7.2 6.5 - 8.1 g/dL   Albumin 3.8 3.5 - 5.0 g/dL   AST 47 (H) 15 - 41 U/L   ALT 36 0 - 44 U/L   Alkaline Phosphatase 37 (L) 38 - 126 U/L   Total Bilirubin 1.5 (H) 0.3 - 1.2 mg/dL   Bilirubin, Direct <6.0 0.0 - 0.2 mg/dL   Indirect Bilirubin NOT CALCULATED 0.3 - 0.9 mg/dL    Comment: Performed at [facility], [address], [place], [place] [address]  Basic metabolic panel     Status: Abnormal   Collection Time: 11/18/19  6:33 AM  Result Value Ref Range   Sodium 139 135 - 145 mmol/L   Potassium 4.6 3.5 - 5.1 mmol/L   Chloride 104 98 - 111 mmol/L   CO2 17 (L) 22 - 32 mmol/L   Glucose, Bld 123 (H) 70 - 99 mg/dL    Comment: Glucose reference range applies only to samples taken after fasting for at least 8 hours.   BUN 34 (H) 6 - 20 mg/dL   Creatinine, Ser 8.11 (H) 0.44 - 1.00 mg/dL   Calcium 8.4 (L) 8.9 - 10.3 mg/dL   GFR calc non Af Amer 58 (L) >60 mL/min   GFR calc Af Amer >60 >60 mL/min   Anion gap 18 (H) 5 - 15    Comment: Performed at [facility], [address] [place], [place] [address]  CBC with Differential/Platelet     Status: Abnormal   Collection Time: 11/18/19  6:33 AM  Result Value Ref Range   WBC 10.1 4.0 - 10.5 K/uL   RBC 3.44 (L) 3.87 - 5.11 MIL/uL   Hemoglobin 12.4 12.0 - 15.0 g/dL   HCT 29.5 62.1 - 30.8 %   MCV 107.0 (H) 80.0 - 100.0 fL   MCH 36.0 (H) 26.0 - 34.0 pg   MCHC 33.7 30.0 - 36.0 g/dL   RDW 65.7 84.6 - 96.2 %   Platelets 122 (L) 150 - 400 K/uL   nRBC 0.0 0.0 - 0.2 %   Neutrophils Relative % 79 %   Neutro Abs 7.9 (H) 1.7 - 7.7 K/uL   Lymphocytes Relative 11 %   Lymphs Abs 1.2 0.7 -  4.0 K/uL   Monocytes Relative 10 %   Monocytes Absolute 1.0 0.1 - 1.0 K/uL   Eosinophils Relative 0 %   Eosinophils Absolute 0.0 0.0 - 0.5 K/uL   Basophils Relative 0 %   Basophils Absolute 0.0 0.0 - 0.1 K/uL   Immature Granulocytes 0 %   Abs Immature Granulocytes 0.03 0.00 - 0.07 K/uL    Comment: Performed at [facility], [address] [place], [place] [address]  Lactic acid, plasma     Status: None   Collection Time: 11/18/19  6:33 AM  Result Value Ref Range   Lactic Acid, Venous 1.0 0.5 - 1.9 mmol/L    Comment: Performed at [facility], [address], [place], [place] [address]  Hemoglobin A1c     Status: None   Collection Time: 11/18/19  6:33 AM  Result Value Ref Range   Hgb A1c MFr Bld 5.2 4.8 - 5.6 %    Comment: (NOTE) Pre diabetes:          5.7%-6.4% Diabetes:              >6.4% Glycemic control for   <7.0% adults with diabetes    Mean Plasma Glucose 102.54 mg/dL    Comment: Performed at [facility], [address] [place], [place] [address]    Current Facility-Administered Medications  Medication Dose Route Frequency Provider Last Rate Last Admin  . acetaminophen (TYLENOL) tablet 650 mg  650 mg Oral Q6H PRN Eduard Clos, MD       Or  . acetaminophen (TYLENOL) suppository 650 mg  650 mg Rectal Q6H PRN Eduard Clos, MD      . folic acid (FOLVITE) tablet 1 mg  1 mg Oral Daily Eduard Clos, MD   1 mg at 11/18/19 0957  . heparin injection 5,000 Units  5,000 Units Subcutaneous Q8H Eduard Clos, MD   5,000 Units at 11/17/19 2243  . labetalol (NORMODYNE) injection 10 mg  10 mg Intravenous Q2H PRN Eduard Clos, MD   10 mg at 11/17/19 2230  . lactated ringers infusion   Intravenous Continuous Eduard Clos, MD   Stopped at 11/18/19 0309  . levETIRAcetam (KEPPRA) IVPB 500 mg/100 mL premix  500 mg Intravenous Q12H Eduard Clos, MD   Stopped at 11/18/19 0330  . LORazepam (ATIVAN) injection 0-4 mg  0-4 mg Intravenous Q6H Eduard Clos, MD   1 mg at 11/17/19 2237   Followed by  . [START ON 11/19/2019] LORazepam (ATIVAN) injection 0-4 mg  0-4 mg Intravenous Q12H Eduard Clos, MD      . LORazepam (ATIVAN) tablet 1-4 mg  1-4 mg Oral Q1H PRN Eduard Clos, MD       Or  . LORazepam (ATIVAN) injection 1-4  mg  1-4 mg Intravenous Q1H PRN Eduard Clos, MD      . morphine 2 MG/ML injection 1 mg  1 mg Intravenous Q3H PRN Eduard Clos, MD   1 mg at 11/17/19 2240  . multivitamin with minerals tablet 1 tablet  1 tablet Oral Daily Eduard Clos, MD   1 tablet at 11/18/19 [phone]  . ondansetron (ZOFRAN) tablet 4 mg  4 mg Oral Q6H PRN Eduard Clos, MD       Or  . ondansetron [facility]) injection 4 mg  4 mg Intravenous Q6H PRN Eduard Clos, MD   4 mg at 11/17/19 2232  . pantoprazole (  PROTONIX) injection 40 mg  40 mg Intravenous Q24H Kc, Ramesh, MD   40 mg at 11/18/19 0956  . sodium chloride 0.9 % bolus 1,000 mL  1,000 mL Intravenous Once Eduard ClosKakrakandy, Arshad N, MD      . thiamine tablet 100 mg  100 mg Oral Daily Eduard ClosKakrakandy, Arshad N, MD   100 mg at 11/18/19 [id number]   Or  . thiamine (B-1) injection 100 mg  100 mg Intravenous Daily Eduard ClosKakrakandy, Arshad N, MD   100 mg at 11/17/19 2234    Musculoskeletal: Strength & Muscle Tone: within normal limits Gait & Station: normal Patient leans: N/A  Psychiatric Specialty Exam: Physical Exam  Nursing note and vitals reviewed. Constitutional: She is oriented to person, place, and time. She appears well-developed.  HENT:  Head: Normocephalic.  Cardiovascular: Normal rate.  Respiratory: Effort normal.  Musculoskeletal:        General: Normal range of motion.     Cervical back: Normal range of motion.  Neurological: She is alert and oriented to person, place, and time.  Psychiatric: She has a normal mood and affect. Her speech is normal and behavior is normal. Judgment and thought content normal. Cognition and memory are normal.    Review of Systems  Constitutional: Negative.   HENT: Negative.   Eyes: Negative.   Respiratory: Negative.   Cardiovascular: Negative.   Gastrointestinal: Negative.   Genitourinary: Negative.   Musculoskeletal: Negative.   Skin: Negative.   Neurological: Negative.     Blood pressure (!) 151/113,  pulse (!) 107, temperature 99.2 F (37.3 C), temperature source Oral, resp. rate 20, height 5\' 2"  (1.575 m), weight 56.3 kg, SpO2 100 %.Body mass index is 22.7 kg/m.  General Appearance: Casual and Fairly Groomed  Eye Contact:  Good  Speech:  Clear and Coherent and Normal Rate  Volume:  Normal  Mood:  Euthymic  Affect:  Appropriate and Congruent  Thought Process:  Coherent, Goal Directed and Descriptions of Associations: Intact  Orientation:  Full (Time, Place, and Person)  Thought Content:  WDL and Logical  Suicidal Thoughts:  No  Homicidal Thoughts:  No  Memory:  Immediate;   Good Recent;   Good Remote;   Good  Judgement:  Fair  Insight:  Fair  Psychomotor Activity:  Normal  Concentration:  Concentration: Good and Attention Span: Good  Recall:  Good  Fund of Knowledge:  Good  Language:  Good  Akathisia:  No  Handed:  Right  AIMS (if indicated):     Assets:  Communication Skills Desire for Improvement Financial Resources/Insurance Housing Intimacy Leisure Time Physical Health Resilience Social Support  ADL's:  Intact  Cognition:  WNL  Sleep:        Treatment Plan Summary: Case discussed with Dr. Jola Babinskilary. Medication management recommend consider Effexor 37.5 mg by mouth daily and Campral 333 mg by mouth daily consider titrating if tolerated well. Follow-up with outpatient psychiatry and alcohol use disorder resources.  Disposition: No evidence of imminent risk to self or others at present.   Patient does not meet criteria for psychiatric inpatient admission. Discussed crisis plan, support from social network, calling 911, coming to the Emergency Department, and calling Suicide Hotline.  Patrcia Dollyina L Tate, FNP 11/18/2019 10:33 AM

## 2019-11-19 LAB — COMPREHENSIVE METABOLIC PANEL
ALT: 31 U/L (ref 0–44)
AST: 50 U/L — ABNORMAL HIGH (ref 15–41)
Albumin: 3.7 g/dL (ref 3.5–5.0)
Alkaline Phosphatase: 35 U/L — ABNORMAL LOW (ref 38–126)
Anion gap: 12 (ref 5–15)
BUN: 8 mg/dL (ref 6–20)
CO2: 25 mmol/L (ref 22–32)
Calcium: 9.3 mg/dL (ref 8.9–10.3)
Chloride: 99 mmol/L (ref 98–111)
Creatinine, Ser: 0.71 mg/dL (ref 0.44–1.00)
GFR calc Af Amer: >60 mL/min (ref >60)
GFR calc non Af Amer: >60 mL/min (ref >60)
Glucose, Bld: 112 mg/dL — ABNORMAL HIGH (ref 70–99)
Potassium: 3.6 mmol/L (ref 3.5–5.1)
Sodium: 136 mmol/L (ref 135–145)
Total Bilirubin: 1.2 mg/dL (ref 0.3–1.2)
Total Protein: 7.1 g/dL (ref 6.5–8.1)

## 2019-11-19 LAB — CBC
HCT: 35.9 % — ABNORMAL LOW (ref 36.0–46.0)
Hemoglobin: 12.6 g/dL (ref 12.0–15.0)
MCH: 36.1 pg — ABNORMAL HIGH (ref 26.0–34.0)
MCHC: 35.1 g/dL (ref 30.0–36.0)
MCV: 102.9 fL — ABNORMAL HIGH (ref 80.0–100.0)
Platelets: 95 10*3/uL — ABNORMAL LOW (ref 150–400)
RBC: 3.49 MIL/uL — ABNORMAL LOW (ref 3.87–5.11)
RDW: 11.2 % — ABNORMAL LOW (ref 11.5–15.5)
WBC: 5.7 10*3/uL (ref 4.0–10.5)
nRBC: 0 % (ref 0.0–0.2)

## 2019-11-19 LAB — LIPASE, BLOOD: Lipase: 209 U/L — ABNORMAL HIGH (ref 11–51)

## 2019-11-19 MED ORDER — HYDRALAZINE HCL 20 MG/ML IJ SOLN
INTRAMUSCULAR | Status: AC
  Administered 2019-11-19: 10 mg via INTRAVENOUS
  Filled 2019-11-19: qty 1

## 2019-11-19 MED ORDER — ACAMPROSATE CALCIUM 333 MG PO TBEC
333.0000 mg | DELAYED_RELEASE_TABLET | Freq: Two times a day (BID) | ORAL | Status: DC
  Administered 2019-11-19 – 2019-11-20 (×3): 333 mg via ORAL
  Filled 2019-11-19 (×7): qty 1

## 2019-11-19 MED ORDER — LISINOPRIL 20 MG PO TABS
20.0000 mg | ORAL_TABLET | Freq: Every day | ORAL | Status: DC
  Administered 2019-11-19: 20 mg via ORAL
  Filled 2019-11-19: qty 1

## 2019-11-19 MED ORDER — HYDRALAZINE HCL 20 MG/ML IJ SOLN: 10.0000 mg | Freq: Once | INTRAMUSCULAR | Status: AC

## 2019-11-19 MED ORDER — VENLAFAXINE HCL ER 37.5 MG PO CP24
37.5000 mg | ORAL_CAPSULE | Freq: Every day | ORAL | Status: DC
  Administered 2019-11-20 – 2019-11-21 (×2): 37.5 mg via ORAL
  Filled 2019-11-19 (×3): qty 1

## 2019-11-19 MED ORDER — LEVETIRACETAM 500 MG PO TABS
500.0000 mg | ORAL_TABLET | Freq: Two times a day (BID) | ORAL | Status: DC
  Administered 2019-11-19 – 2019-11-22 (×7): 500 mg via ORAL
  Filled 2019-11-19 (×7): qty 1

## 2019-11-19 MED ORDER — HYDROCODONE-ACETAMINOPHEN 5-325 MG PO TABS
1.0000 | ORAL_TABLET | Freq: Four times a day (QID) | ORAL | Status: DC | PRN
  Administered 2019-11-19: 1 via ORAL
  Filled 2019-11-19: qty 1

## 2019-11-19 NOTE — Plan of Care (Signed)

## 2019-11-19 NOTE — Plan of Care (Signed)
  Problem: Education: Goal: Knowledge of General Education information will improve Description: Including pain rating scale, medication(s)/side effects and non-pharmacologic comfort measures 11/19/2019 1952 by Debbrah Alar, RN Outcome: Progressing 11/19/2019 1951 by Debbrah Alar, RN Outcome: Progressing   Problem: Health Behavior/Discharge Planning: Goal: Ability to manage health-related needs will improve 11/19/2019 1952 by Debbrah Alar, RN Outcome: Progressing 11/19/2019 1951 by Debbrah Alar, RN Outcome: Progressing   Problem: Clinical Measurements: Goal: Ability to maintain clinical measurements within normal limits will improve 11/19/2019 1952 by Debbrah Alar, RN Outcome: Progressing 11/19/2019 1951 by Debbrah Alar, RN Outcome: Progressing Goal: Will remain free from infection 11/19/2019 1952 by Debbrah Alar, RN Outcome: Progressing 11/19/2019 1951 by Debbrah Alar, RN Outcome: Progressing Goal: Diagnostic test results will improve 11/19/2019 1952 by Debbrah Alar, RN Outcome: Progressing 11/19/2019 1951 by Velia Meyer D, RN Outcome: Progressing Goal: Respiratory complications will improve 11/19/2019 1952 by Debbrah Alar, RN Outcome: Progressing 11/19/2019 1951 by Velia Meyer D, RN Outcome: Progressing Goal: Cardiovascular complication will be avoided 11/19/2019 1952 by Debbrah Alar, RN Outcome: Progressing 11/19/2019 1951 by Debbrah Alar, RN Outcome: Progressing   Problem: Activity: Goal: Risk for activity intolerance will decrease 11/19/2019 1952 by Debbrah Alar, RN Outcome: Progressing 11/19/2019 1951 by Debbrah Alar, RN Outcome: Progressing   Problem: Nutrition: Goal: Adequate nutrition will be maintained 11/19/2019 1952 by Debbrah Alar, RN Outcome: Progressing 11/19/2019 1951 by Velia Meyer D, RN Outcome: Progressing   Problem: Coping: Goal: Level of anxiety will  decrease 11/19/2019 1952 by Debbrah Alar, RN Outcome: Progressing 11/19/2019 1951 by Debbrah Alar, RN Outcome: Progressing   Problem: Elimination: Goal: Will not experience complications related to bowel motility 11/19/2019 1952 by Debbrah Alar, RN Outcome: Progressing 11/19/2019 1951 by Debbrah Alar, RN Outcome: Progressing Goal: Will not experience complications related to urinary retention 11/19/2019 1952 by Debbrah Alar, RN Outcome: Progressing 11/19/2019 1951 by Debbrah Alar, RN Outcome: Progressing   Problem: Pain Managment: Goal: General experience of comfort will improve 11/19/2019 1952 by Debbrah Alar, RN Outcome: Progressing 11/19/2019 1951 by Debbrah Alar, RN Outcome: Progressing   Problem: Safety: Goal: Ability to remain free from injury will improve 11/19/2019 1952 by Debbrah Alar, RN Outcome: Progressing 11/19/2019 1951 by Velia Meyer D, RN Outcome: Progressing   Problem: Skin Integrity: Goal: Risk for impaired skin integrity will decrease 11/19/2019 1952 by Debbrah Alar, RN Outcome: Progressing 11/19/2019 1951 by Debbrah Alar, RN Outcome: Progressing

## 2019-11-19 NOTE — Progress Notes (Signed)
PROGRESS NOTE    Donna Short  TOI:[id number] DOB: [birth year] DOA: 11/17/2019 PCP: Orma Flaming, MD   Brief Narrative: 29 year old female with history of alcohol abuse with recurrent alcohol induced pancreatitis, hypertension, seizure disorder presents to the ER with epigastric pain and associated recurrent episodes of vomiting x24 hours.  In the ED labs showed elevated lactic acid, low bicarb hyperkalemia renal failure elevated LFTs hyperglycemia, leukocytosis.  Discharge negative, acute abdomen series was negative, patient also endorsed suicidal ideation.  Patient was aggressively hydrated with IV fluid and was admitted, placed on suicide precaution. Patient electrolytes significantly improved, tolerating diet and diet is being advanced.  Subjective:  No nausea vomiting, still abd pain- tolerating CLD-advancing to full liquid diet No SI or plan or intent-no need for one-to-one sitter at the bedside which will be discontinued  Assessment & Plan:  Acute on chronic pancreatitis: In the setting of alcohol use.  Significantly improving continue on IV fluid hydration, clear liquid diet which will be advanced to full liquid diet, continue pain control oral IV opiates.  Monitor lfts lipase.  Elevated LFTs/alcoholic hepatitis:improving with hydration monitor. Monitor. Recent Labs  Lab 11/17/19 1715 11/18/19 0633 11/19/19 0314  AST 75* 47* 50*  ALT 52* 36 31  ALKPHOS 51 37* 35*  BILITOT 2.1* 1.5* 1.2  PROT 9.7* 7.2 7.1  ALBUMIN 4.9 3.8 3.7   Acute kidney injury/hyperkalemia: Prerenal due to #1.  Resolved.  Resume her home lisinopril given her uncontrolled hypertension. Encourage oral intake.  Recent Labs  Lab 11/17/19 1715 11/18/19 0633 11/19/19 0314  BUN 44* 34* 8  CREATININE 2.22* 1.24* 0.71   Lactic acidosis/metabolic acidosis anion gap due to #1, lactic acidosis resolved.  Bicarb improved.   Cont ivf  Hypertension urgency/benign essential HTN: Poorly  controlled-continue metoprolol resume lisinopril since AKI resolved.  Continue IV fluids.  Continue as needed medication.   ETOH abuse-cessation counselling.  Continue thiamine vitamins. Added acamprosate per psych  Depression and suicide ideation on one-to-one suicide precautions will be discontinued since patient denies any suicidal ideation, she made a comment when she was stressed-seen by psychiatry, started on venlafaxine.Marland Kitchen  History of seizure disorder continue home Keppra  Hyperglycemia likely reactive.  Hemoglobin A1c is normal t 5.2  Nutrition: Diet Order            Diet full liquid Room service appropriate? Yes; Fluid consistency: Thin  Diet effective now                    Body mass index is 22.22 kg/m.  Pressure Ulcer:    DVT prophylaxis:Heparin Code Status:full  Family Communication: plan of care discussed with patient at bedside. Disposition Plan: Patient is from:home Anticipated Disposition: to  Home, in-24 hours.   Barriers to discharge or conditions that needs to be met prior to discharge: Patient admitted with acute pancreatitis-improving and plan on discharge once tolerating soft diet in next 24 hours.   Consultants: psych Procedures:see note Microbiology:see note   Medications: Scheduled Meds: . acamprosate  333 mg Oral Daily  . folic acid  1 mg Oral Daily  . heparin  5,000 Units Subcutaneous Q8H  . levETIRAcetam  500 mg Oral BID  . LORazepam  0-4 mg Intravenous Q6H   Followed by  . LORazepam  0-4 mg Intravenous Q12H  . metoprolol tartrate  50 mg Oral BID  . multivitamin with minerals  1 tablet Oral Daily  . pantoprazole (PROTONIX) IV  40 mg Intravenous Q24H  . thiamine  100 mg Oral Daily   Or  . thiamine  100 mg Intravenous Daily  . [START ON 11/20/2019] venlafaxine XR  37.5 mg Oral Q breakfast   Continuous Infusions: . sodium chloride      Antimicrobials: Anti-infectives (From admission, onward)   None        Objective: Vitals: Today's Vitals   11/18/19 1713 11/18/19 1715 11/18/19 1915 11/19/19 0625  BP: (!) 168/118 (!) 151/111    Pulse: 90     Resp: 18     Temp: 98.7 F (37.1 C)     TempSrc: Oral     SpO2: 100%     Weight:    55.1 kg  Height:      PainSc:   Asleep     Intake/Output Summary (Last 24 hours) at 11/19/2019 0835 Last data filed at 11/19/2019 0237 Gross per 24 hour  Intake 450 ml  Output --  Net 450 ml   Filed Weights   11/18/19 0700 11/19/19 0625  Weight: 56.3 kg 55.1 kg   Weight change: -1.188 kg   Intake/Output from previous day: 03/26 0701 - 03/27 0700 In: 450 [P.O.:350; IV Piggyback:100] Out: -  Intake/Output this shift: No intake/output data recorded.  Examination: General exam:AAOx3,NAD,weak appearing. HEENT:Oral mucosa moist, Ear/Nose WNL grossly,dentition normal. Respiratory system: bilaterally clear,no wheezing or crackles,no use of accessory muscle, non tender. Cardiovascular system: S1 & S2 +, regular, No JVD. Gastrointestinal system: Abdomen soft, tenderness in the mid abdomen, nondistended bowel sounds present  Nervous System:Alert, awake, moving extremities and grossly nonfocal Extremities: No edema, distal peripheral pulses palpable.  Skin: No rashes,no icterus. MSK: Normal muscle bulk,tone, power  Data Reviewed: I have personally reviewed following labs and imaging studies CBC: Recent Labs  Lab 11/17/19 1715 11/17/19 2006 11/18/19 0633 11/19/19 0314  WBC 16.7*  --  10.1 5.7  NEUTROABS  --   --  7.9*  --   HGB 14.6 17.0* 12.4 12.6  HCT 45.1 50.0* 36.8 35.9*  MCV 111.6*  --  107.0* 102.9*  PLT 200  --  122* 95*   Basic Metabolic Panel: Recent Labs  Lab 11/17/19 1715 11/17/19 2002 11/17/19 2006 11/18/19 0633 11/19/19 0314  NA 138  --  133* 139 136  K 5.6*  --  6.0* 4.6 3.6  CL 93*  --   --  104 99  CO2 7*  --   --  17* 25  GLUCOSE 212*  --   --  123* 112*  BUN 44*  --   --  34* 8  CREATININE 2.22*  --   --  1.24*  0.71  CALCIUM 9.4  --   --  8.4* 9.3  MG  --  2.2  --   --   --   PHOS  --  6.1*  --   --   --    GFR: Estimated Creatinine Clearance: 81.3 mL/min (by C-G formula based on SCr of 0.71 mg/dL). Liver Function Tests: Recent Labs  Lab 11/17/19 1715 11/18/19 0633 11/19/19 0314  AST 75* 47* 50*  ALT 52* 36 31  ALKPHOS 51 37* 35*  BILITOT 2.1* 1.5* 1.2  PROT 9.7* 7.2 7.1  ALBUMIN 4.9 3.8 3.7   Recent Labs  Lab 11/17/19 2002 11/19/19 0314  LIPASE 963* 209*   No results for input(s): AMMONIA in the last 168 hours. Coagulation Profile: No results for input(s): INR, PROTIME in the last 168 hours. Cardiac Enzymes: No results for input(s): CKTOTAL, CKMB, CKMBINDEX, TROPONINI in the  last 168 hours. BNP (last 3 results) No results for input(s): PROBNP in the last 8760 hours. HbA1C: Recent Labs    11/18/19 0633  HGBA1C 5.2   CBG: Recent Labs  Lab 11/18/19 0136 11/18/19 1253 11/18/19 2012  GLUCAP 137* 108* 86   Lipid Profile: No results for input(s): CHOL, HDL, LDLCALC, TRIG, CHOLHDL, LDLDIRECT in the last 72 hours. Thyroid Function Tests: No results for input(s): TSH, T4TOTAL, FREET4, T3FREE, THYROIDAB in the last 72 hours. Anemia Panel: No results for input(s): VITAMINB12, FOLATE, FERRITIN, TIBC, IRON, RETICCTPCT in the last 72 hours. Sepsis Labs: Recent Labs  Lab 11/17/19 2002 11/18/19 4401  LATICACIDVEN 3.3* 1.0    Recent Results (from the past 240 hour(s))  Respiratory Panel by RT PCR (Flu A&B, Covid) - Nasopharyngeal Swab     Status: None   Collection Time: 11/17/19  9:43 PM   Specimen: Nasopharyngeal Swab  Result Value Ref Range Status   SARS Coronavirus 2 by RT PCR NEGATIVE NEGATIVE Final    Comment: (NOTE) SARS-CoV-2 target nucleic acids are NOT DETECTED. The SARS-CoV-2 RNA is generally detectable in upper respiratoy specimens during the acute phase of infection. The lowest concentration of SARS-CoV-2 viral copies this assay can detect is 131 copies/mL.  A negative result does not preclude SARS-Cov-2 infection and should not be used as the sole basis for treatment or other patient management decisions. A negative result may occur with  improper specimen collection/handling, submission of specimen other than nasopharyngeal swab, presence of viral mutation(s) within the areas targeted by this assay, and inadequate number of viral copies (<131 copies/mL). A negative result must be combined with clinical observations, patient history, and epidemiological information. The expected result is Negative. Fact Sheet for Patients:  PinkCheek.be Fact Sheet for Healthcare Providers:  GravelBags.it This test is not yet ap proved or cleared by the [place] FDA and  has been authorized for detection and/or diagnosis of SARS-CoV-2 by FDA under an Emergency Use Authorization (EUA). This EUA will remain  in effect (meaning this test can be used) for the duration of the COVID-19 declaration under Section 564(b)(1) of the Act, 21 U.S.C. section 360bbb-3(b)(1), unless the authorization is terminated or revoked sooner.    Influenza A by PCR NEGATIVE NEGATIVE Final   Influenza B by PCR NEGATIVE NEGATIVE Final    Comment: (NOTE) The Xpert Xpress SARS-CoV-2/FLU/RSV assay is intended as an aid in  the diagnosis of influenza from Nasopharyngeal swab specimens and  should not be used as a sole basis for treatment. Nasal washings and  aspirates are unacceptable for Xpert Xpress SARS-CoV-2/FLU/RSV  testing. Fact Sheet for Patients: PinkCheek.be Fact Sheet for Healthcare Providers: GravelBags.it This test is not yet approved or cleared by the [place] FDA and  has been authorized for detection and/or diagnosis of SARS-CoV-2 by  FDA under an Emergency Use Authorization (EUA). This EUA will remain  in effect (meaning this test can be used)  for the duration of the  Covid-19 declaration under Section 564(b)(1) of the Act, 21  U.S.C. section 360bbb-3(b)(1), unless the authorization is  terminated or revoked. Performed at [facility], [place] [address], [place], [place] [address]       Radiology Studies: DG ABD ACUTE 2+V W 1V CHEST  Result Date: 11/18/2019 CLINICAL DATA:  Abdominal pain EXAM: DG ABDOMEN ACUTE W/ 1V CHEST COMPARISON:  02/24/2019 FINDINGS: There is no evidence of dilated bowel loops or free intraperitoneal air. No radiopaque calculi or other significant radiographic abnormality is  seen. Heart size and mediastinal contours are within normal limits. Both lungs are clear. IMPRESSION: Negative abdominal radiographs.  No acute cardiopulmonary disease. Electronically Signed   By: Rolm Baptise M.D.   On: 11/18/2019 02:38     LOS: 2 days   Time spent: More than 50% of that time was spent in counseling and/or coordination of care.  Antonieta Pert, MD [facility]  11/19/2019, 8:35 AM

## 2019-11-20 LAB — COMPREHENSIVE METABOLIC PANEL
ALT: 37 U/L (ref 0–44)
AST: 52 U/L — ABNORMAL HIGH (ref 15–41)
Albumin: 4 g/dL (ref 3.5–5.0)
Alkaline Phosphatase: 36 U/L — ABNORMAL LOW (ref 38–126)
Anion gap: 14 (ref 5–15)
BUN: 8 mg/dL (ref 6–20)
CO2: 22 mmol/L (ref 22–32)
Calcium: 9.6 mg/dL (ref 8.9–10.3)
Chloride: 99 mmol/L (ref 98–111)
Creatinine, Ser: 0.58 mg/dL (ref 0.44–1.00)
GFR calc Af Amer: >60 mL/min (ref >60)
GFR calc non Af Amer: >60 mL/min (ref >60)
Glucose, Bld: 127 mg/dL — ABNORMAL HIGH (ref 70–99)
Potassium: 3.9 mmol/L (ref 3.5–5.1)
Sodium: 135 mmol/L (ref 135–145)
Total Bilirubin: 1.1 mg/dL (ref 0.3–1.2)
Total Protein: 7.8 g/dL (ref 6.5–8.1)

## 2019-11-20 LAB — CBC
HCT: 38.8 % (ref 36.0–46.0)
Hemoglobin: 13.6 g/dL (ref 12.0–15.0)
MCH: 35.8 pg — ABNORMAL HIGH (ref 26.0–34.0)
MCHC: 35.1 g/dL (ref 30.0–36.0)
MCV: 102.1 fL — ABNORMAL HIGH (ref 80.0–100.0)
Platelets: 98 10*3/uL — ABNORMAL LOW (ref 150–400)
RBC: 3.8 MIL/uL — ABNORMAL LOW (ref 3.87–5.11)
RDW: 10.8 % — ABNORMAL LOW (ref 11.5–15.5)
WBC: 5.5 10*3/uL (ref 4.0–10.5)
nRBC: 0 % (ref 0.0–0.2)

## 2019-11-20 LAB — GLUCOSE, CAPILLARY
Glucose-Capillary: 121 mg/dL — ABNORMAL HIGH (ref 70–99)
Glucose-Capillary: 126 mg/dL — ABNORMAL HIGH (ref 70–99)

## 2019-11-20 LAB — TSH: TSH: 1.37 u[IU]/mL (ref 0.350–4.500)

## 2019-11-20 LAB — LIPASE, BLOOD: Lipase: 112 U/L — ABNORMAL HIGH (ref 11–51)

## 2019-11-20 MED ORDER — METOPROLOL TARTRATE 5 MG/5ML IV SOLN
5.0000 mg | Freq: Four times a day (QID) | INTRAVENOUS | Status: DC | PRN
  Filled 2019-11-20: qty 5

## 2019-11-20 MED ORDER — DEXAMETHASONE SODIUM PHOSPHATE 10 MG/ML IJ SOLN
INTRAMUSCULAR | Status: AC
  Filled 2019-11-20: qty 1

## 2019-11-20 MED ORDER — METOPROLOL TARTRATE 5 MG/5ML IV SOLN: 5.0000 mg | Freq: Once | INTRAVENOUS | Status: AC

## 2019-11-20 MED ORDER — METOPROLOL TARTRATE 5 MG/5ML IV SOLN
INTRAVENOUS | Status: AC
  Administered 2019-11-20: 5 mg via INTRAVENOUS
  Filled 2019-11-20: qty 5

## 2019-11-20 MED ORDER — LACTATED RINGERS IV SOLN: INTRAVENOUS | Status: DC

## 2019-11-20 MED ORDER — LISINOPRIL 40 MG PO TABS
40.0000 mg | ORAL_TABLET | Freq: Every day | ORAL | Status: DC
  Administered 2019-11-20 – 2019-11-22 (×3): 40 mg via ORAL
  Filled 2019-11-20 (×3): qty 1

## 2019-11-20 MED ORDER — SODIUM CHLORIDE 0.9 % IV BOLUS
1000.0000 mL | Freq: Once | INTRAVENOUS | Status: AC
  Administered 2019-11-20: 1000 mL via INTRAVENOUS

## 2019-11-20 MED ORDER — PANTOPRAZOLE SODIUM 40 MG PO TBEC
40.0000 mg | DELAYED_RELEASE_TABLET | Freq: Every day | ORAL | Status: DC
  Administered 2019-11-21 – 2019-11-22 (×2): 40 mg via ORAL
  Filled 2019-11-20 (×2): qty 1

## 2019-11-20 MED ORDER — HYDRALAZINE HCL 20 MG/ML IJ SOLN
10.0000 mg | Freq: Four times a day (QID) | INTRAMUSCULAR | Status: DC | PRN
  Administered 2019-11-20 – 2019-11-21 (×4): 10 mg via INTRAVENOUS
  Filled 2019-11-20 (×4): qty 1

## 2019-11-20 NOTE — Progress Notes (Signed)
PROGRESS NOTE    Donna Short  GEX:[id number] DOB: July 12, 1991 DOA: 11/17/2019 PCP: Orma Flaming, MD   Brief Narrative: 29 year old female with history of alcohol abuse with recurrent alcohol induced pancreatitis, hypertension, seizure disorder presents to the ER with epigastric pain and associated recurrent episodes of vomiting x24 hours.  In the ED labs showed elevated lactic acid, low bicarb hyperkalemia renal failure elevated LFTs hyperglycemia, leukocytosis.  Discharge negative, acute abdomen series was negative, patient also endorsed suicidal ideation.  Patient was aggressively hydrated with IV fluid and was admitted, placed on suicide precaution. Patient electrolytes significantly improved, tolerating diet and diet improved being advanced.  Subjective:  Patient has been tachycardic hypertensive- HR in 150s Trying to have meal=mashed potato.Marland Kitchen Hr further up in 150s. EKG- Sinus Tachy.  GAve Lopressor 5 mg and heart rate down to 120s and also fluid bolus started Some nausea mild, abd pain better. No SI or plan or intent- off one to one  Assessment & Plan:  Acute on chronic pancreatitis: In the setting of alcohol use. Patient overall feeling improved, tolerating soft diet.lfts/lipase downtrending.  Tachycardia/Hypertension- check tsh. Suspect 2/2 dehydration, etoh withdrawals. Will given lopressor 5 mg x1, cont ivf.cont metoprolol. Will bolus with nss ivf 1 liter, add prn lopressor.   Elevated LFTs/alcoholic hepatitis:improving with hydration monitor. Monitor. Recent Labs  Lab 11/17/19 1715 11/18/19 0633 11/19/19 0314 11/20/19 0132  AST 75* 47* 50* 52*  ALT 52* 36 31 37  ALKPHOS 51 37* 35* 36*  BILITOT 2.1* 1.5* 1.2 1.1  PROT 9.7* 7.2 7.1 7.8  ALBUMIN 4.9 3.8 3.7 4.0   Acute kidney injury/hyperkalemia: Prerenal due to #1.  Resolved. Back on acei. Encourage oral intake.  Recent Labs  Lab 11/17/19 1715 11/18/19 0633 11/19/19 0314 11/20/19 0132  BUN 44* 34* 8 8   CREATININE 2.22* 1.24* 0.71 0.58   Lactic acidosis/metabolic acidosis anion gap due to #1, lactic acidosis resolved.  Bicarb improved.   Cont ivf  Hypertension urgency/benign essential HTN: Poorly controlled-increase lisinopril to 40 mg daily, continue metoprolol. Cont as needed hydralazine/labetalol  ETOH abuse with withdrawal-cessation counselling. Cont ciwas ativan score this am was 12 and received ativan this am.  No tremors in hand alert,awake oriented x3. Cont ivf. Continue thiamine vitamins. Cont acamprosate per psych  Depression and suicide ideation on one-to-one suicide precautions will be discontinued since patient denies any suicidal ideation, she made a comment when she was stressed-seen by psychiatry, started on venlafaxine.Marland Kitchen  History of seizure disorder continue home Keppra  Hyperglycemia likely reactive.  Hemoglobin A1c is normal t 5.2  Thrombocytopnenia: likley from etoh abuse Recent Labs  Lab 11/17/19 1715 11/18/19 0633 11/19/19 0314 11/20/19 0132  PLT 200 122* 95* 98*    Nutrition: Diet Order            DIET SOFT Room service appropriate? Yes; Fluid consistency: Thin  Diet effective now             Body mass index is 22.22 kg/m.  DVT prophylaxis: hold Heparin 2/2 low paltelet Code Status:full  Family Communication: plan of care discussed with patient at bedside. Disposition Plan: Patient is from:home Anticipated Disposition: to  Home, in-24 hours.   Barriers to discharge or conditions that needs to be met prior to discharge: Patient admitted with acute pancreatitis, alcohol withdrawal remains hospitalized for ongoing management of withdrawal, tachycardia.  Consultants: psych Procedures:see note Microbiology:see note   Medications: Scheduled Meds: . acamprosate  333 mg Oral BID  . folic acid  1  mg Oral Daily  . heparin  5,000 Units Subcutaneous Q8H  . levETIRAcetam  500 mg Oral BID  . lisinopril  40 mg Oral Daily  . LORazepam  0-4 mg Intravenous  Q12H  . metoprolol tartrate  5 mg Intravenous Once  . metoprolol tartrate  50 mg Oral BID  . multivitamin with minerals  1 tablet Oral Daily  . pantoprazole (PROTONIX) IV  40 mg Intravenous Q24H  . thiamine  100 mg Oral Daily   Or  . thiamine  100 mg Intravenous Daily  . venlafaxine XR  37.5 mg Oral Q breakfast   Continuous Infusions: . lactated ringers    . sodium chloride    . sodium chloride      Antimicrobials: Anti-infectives (From admission, onward)   None       Objective: Vitals: Today's Vitals   11/19/19 1943 11/19/19 2058 11/19/19 2100 11/20/19 0853  BP: (!) 145/114  (!) 139/105 (!) 169/126  Pulse: (!) 126  (!) 116 (!) 122  Resp:   (!) 23 16  Temp: 99.7 F (37.6 C)  99.4 F (37.4 C) 98.9 F (37.2 C)  TempSrc: Oral  Oral   SpO2: 100%   100%  Weight:      Height:      PainSc: 7  0-No pain      Intake/Output Summary (Last 24 hours) at 11/20/2019 1040 Last data filed at 11/19/2019 1946 Gross per 24 hour  Intake 237 ml  Output -  Net 237 ml   Filed Weights   11/18/19 0700 11/19/19 0625  Weight: 56.3 kg 55.1 kg   Weight change:    Intake/Output from previous day: 03/27 0701 - 03/28 0700 In: 357 [P.O.:357] Out: -  Intake/Output this shift: No intake/output data recorded.  Examination: General exam:AAOx3,NAD,weak appearing. HEENT:Oral mucosa moist, Ear/Nose WNL grossly,dentition normal. Respiratory system: bilaterally clear,no wheezing or crackles,no use of accessory muscle, non tender. Cardiovascular system: S1 & S2 +, regular, No JVD.  Tachycardic. Gastrointestinal system: Abdomen soft, tenderness in the mid abdomen, nondistended bowel sounds present  Nervous System:Alert, awake, moving extremities and grossly nonfocal Extremities: No edema, distal peripheral pulses palpable.  Skin: No rashes,no icterus. MSK: Normal muscle bulk,tone, power  Data Reviewed: I have personally reviewed following labs and imaging studies CBC: Recent Labs  Lab  11/17/19 1715 11/17/19 2006 11/18/19 0633 11/19/19 0314 11/20/19 0132  WBC 16.7*  --  10.1 5.7 5.5  NEUTROABS  --   --  7.9*  --   --   HGB 14.6 17.0* 12.4 12.6 13.6  HCT 45.1 50.0* 36.8 35.9* 38.8  MCV 111.6*  --  107.0* 102.9* 102.1*  PLT 200  --  122* 95* 98*   Basic Metabolic Panel: Recent Labs  Lab 11/17/19 1715 11/17/19 2002 11/17/19 2006 11/18/19 0633 11/19/19 0314 11/20/19 0132  NA 138  --  133* 139 136 135  K 5.6*  --  6.0* 4.6 3.6 3.9  CL 93*  --   --  104 99 99  CO2 7*  --   --  17* 25 22  GLUCOSE 212*  --   --  123* 112* 127*  BUN 44*  --   --  34* 8 8  CREATININE 2.22*  --   --  1.24* 0.71 0.58  CALCIUM 9.4  --   --  8.4* 9.3 9.6  MG  --  2.2  --   --   --   --   PHOS  --  6.1*  --   --   --   --  GFR: Estimated Creatinine Clearance: 81.3 mL/min (by C-G formula based on SCr of 0.58 mg/dL). Liver Function Tests: Recent Labs  Lab 11/17/19 1715 11/18/19 0633 11/19/19 0314 11/20/19 0132  AST 75* 47* 50* 52*  ALT 52* 36 31 37  ALKPHOS 51 37* 35* 36*  BILITOT 2.1* 1.5* 1.2 1.1  PROT 9.7* 7.2 7.1 7.8  ALBUMIN 4.9 3.8 3.7 4.0   Recent Labs  Lab 11/17/19 2002 11/19/19 0314 11/20/19 0132  LIPASE 963* 209* 112*   No results for input(s): AMMONIA in the last 168 hours. Coagulation Profile: No results for input(s): INR, PROTIME in the last 168 hours. Cardiac Enzymes: No results for input(s): CKTOTAL, CKMB, CKMBINDEX, TROPONINI in the last 168 hours. BNP (last 3 results) No results for input(s): PROBNP in the last 8760 hours. HbA1C: Recent Labs    11/18/19 0633  HGBA1C 5.2   CBG: Recent Labs  Lab 11/18/19 0136 11/18/19 1253 11/18/19 2012 11/20/19 0026  GLUCAP 137* 108* 86 121*   Lipid Profile: No results for input(s): CHOL, HDL, LDLCALC, TRIG, CHOLHDL, LDLDIRECT in the last 72 hours. Thyroid Function Tests: No results for input(s): TSH, T4TOTAL, FREET4, T3FREE, THYROIDAB in the last 72 hours. Anemia Panel: No results for input(s):  VITAMINB12, FOLATE, FERRITIN, TIBC, IRON, RETICCTPCT in the last 72 hours. Sepsis Labs: Recent Labs  Lab 11/17/19 2002 11/18/19 2878  LATICACIDVEN 3.3* 1.0    Recent Results (from the past 240 hour(s))  Respiratory Panel by RT PCR (Flu A&B, Covid) - Nasopharyngeal Swab     Status: None   Collection Time: 11/17/19  9:43 PM   Specimen: Nasopharyngeal Swab  Result Value Ref Range Status   SARS Coronavirus 2 by RT PCR NEGATIVE NEGATIVE Final    Comment: (NOTE) SARS-CoV-2 target nucleic acids are NOT DETECTED. The SARS-CoV-2 RNA is generally detectable in upper respiratoy specimens during the acute phase of infection. The lowest concentration of SARS-CoV-2 viral copies this assay can detect is 131 copies/mL. A negative result does not preclude SARS-Cov-2 infection and should not be used as the sole basis for treatment or other patient management decisions. A negative result may occur with  improper specimen collection/handling, submission of specimen other than nasopharyngeal swab, presence of viral mutation(s) within the areas targeted by this assay, and inadequate number of viral copies (<131 copies/mL). A negative result must be combined with clinical observations, patient history, and epidemiological information. The expected result is Negative. Fact Sheet for Patients:  PinkCheek.be Fact Sheet for Healthcare Providers:  GravelBags.it This test is not yet ap proved or cleared by the [place] FDA and  has been authorized for detection and/or diagnosis of SARS-CoV-2 by FDA under an Emergency Use Authorization (EUA). This EUA will remain  in effect (meaning this test can be used) for the duration of the COVID-19 declaration under Section 564(b)(1) of the Act, 21 U.S.C. section 360bbb-3(b)(1), unless the authorization is terminated or revoked sooner.    Influenza A by PCR NEGATIVE NEGATIVE Final   Influenza B by PCR  NEGATIVE NEGATIVE Final    Comment: (NOTE) The Xpert Xpress SARS-CoV-2/FLU/RSV assay is intended as an aid in  the diagnosis of influenza from Nasopharyngeal swab specimens and  should not be used as a sole basis for treatment. Nasal washings and  aspirates are unacceptable for Xpert Xpress SARS-CoV-2/FLU/RSV  testing. Fact Sheet for Patients: PinkCheek.be Fact Sheet for Healthcare Providers: GravelBags.it This test is not yet approved or cleared by the [place] and  has been authorized for  detection and/or diagnosis of SARS-CoV-2 by  FDA under an Emergency Use Authorization (EUA). This EUA will remain  in effect (meaning this test can be used) for the duration of the  Covid-19 declaration under Section 564(b)(1) of the Act, 21  U.S.C. section 360bbb-3(b)(1), unless the authorization is  terminated or revoked. Performed at [facility], [place] [address] [place], [place] [address]       Radiology Studies: No results found.   LOS: 3 days   Time spent: More than 50% of that time was spent in counseling and/or coordination of care.  Antonieta Pert, MD [facility]  11/20/2019, 10:40 AM

## 2019-11-20 NOTE — Progress Notes (Signed)
PHARMACIST - PHYSICIAN COMMUNICATION  DR:   Jonathon Bellows  CONCERNING: IV to Oral Route Change Policy  RECOMMENDATION: This patient is receiving pantoprazole by the intravenous route.  Based on criteria approved by the Pharmacy and Therapeutics Committee, the intravenous medication(s) is/are being converted to the equivalent oral dose form(s).   DESCRIPTION: These criteria include:  The patient is eating (either orally or via tube) and/or has been taking other orally administered medications for a least 24 hours  The patient has no evidence of active gastrointestinal bleeding or impaired GI absorption (gastrectomy, short bowel, patient on TNA or NPO).  If you have questions about this conversion, please contact the Pharmacy Department  []   [phone] )  [phone] []   [phone] )  [facility] [x]   [phone] )  [facility] []   [phone] )  [facility] []   [phone] )  [facility]   [place], [facility] 11/20/2019 1:34 PM

## 2019-11-20 NOTE — Progress Notes (Addendum)
HR sustaining 150-170. Pt asymptomatic. EKG and VS obtained. Medications have just been given for elevated BP, per MAR. Dayna Barker MD paged via amion  10:45 Donna Short to bedside. 1L bolus, continous IVF, and 5mg  IV metoprolol administered  10:55 HR decreased to 120-130. Pt remains asymptomatic.

## 2019-11-21 LAB — COMPREHENSIVE METABOLIC PANEL
ALT: 42 U/L (ref 0–44)
AST: 72 U/L — ABNORMAL HIGH (ref 15–41)
Albumin: 3.9 g/dL (ref 3.5–5.0)
Alkaline Phosphatase: 31 U/L — ABNORMAL LOW (ref 38–126)
Anion gap: 12 (ref 5–15)
BUN: 8 mg/dL (ref 6–20)
CO2: 24 mmol/L (ref 22–32)
Calcium: 9.5 mg/dL (ref 8.9–10.3)
Chloride: 96 mmol/L — ABNORMAL LOW (ref 98–111)
Creatinine, Ser: 0.71 mg/dL (ref 0.44–1.00)
GFR calc Af Amer: >60 mL/min (ref >60)
GFR calc non Af Amer: >60 mL/min (ref >60)
Glucose, Bld: 143 mg/dL — ABNORMAL HIGH (ref 70–99)
Potassium: 3.5 mmol/L (ref 3.5–5.1)
Sodium: 132 mmol/L — ABNORMAL LOW (ref 135–145)
Total Bilirubin: 0.9 mg/dL (ref 0.3–1.2)
Total Protein: 7.2 g/dL (ref 6.5–8.1)

## 2019-11-21 LAB — CBC
HCT: 36 % (ref 36.0–46.0)
Hemoglobin: 12.4 g/dL (ref 12.0–15.0)
MCH: 35.6 pg — ABNORMAL HIGH (ref 26.0–34.0)
MCHC: 34.4 g/dL (ref 30.0–36.0)
MCV: 103.4 fL — ABNORMAL HIGH (ref 80.0–100.0)
Platelets: 86 10*3/uL — ABNORMAL LOW (ref 150–400)
RBC: 3.48 MIL/uL — ABNORMAL LOW (ref 3.87–5.11)
RDW: 11 % — ABNORMAL LOW (ref 11.5–15.5)
WBC: 7.1 10*3/uL (ref 4.0–10.5)
nRBC: 0 % (ref 0.0–0.2)

## 2019-11-21 LAB — GLUCOSE, CAPILLARY
Glucose-Capillary: 143 mg/dL — ABNORMAL HIGH (ref 70–99)
Glucose-Capillary: 102 mg/dL — ABNORMAL HIGH (ref 70–99)
Glucose-Capillary: 141 mg/dL — ABNORMAL HIGH (ref 70–99)

## 2019-11-21 LAB — LIPASE, BLOOD: Lipase: 94 U/L — ABNORMAL HIGH (ref 11–51)

## 2019-11-21 MED ORDER — METOPROLOL TARTRATE 100 MG PO TABS
100.0000 mg | ORAL_TABLET | Freq: Two times a day (BID) | ORAL | Status: DC
  Administered 2019-11-21 – 2019-11-22 (×3): 100 mg via ORAL
  Filled 2019-11-21 (×3): qty 1

## 2019-11-21 NOTE — Progress Notes (Signed)
PROGRESS NOTE    Donna Short  WNU:[id number] DOB: [birth year] DOA: 11/17/2019 PCP: Orma Flaming, MD   Brief Narrative: 29 year old female with history of alcohol abuse with recurrent alcohol induced pancreatitis, hypertension, seizure disorder presents to the ER with epigastric pain and associated recurrent episodes of vomiting x24 hours.  In the ED labs showed elevated lactic acid, low bicarb hyperkalemia renal failure elevated LFTs hyperglycemia, leukocytosis.  Discharge negative, acute abdomen series was negative, patient also endorsed suicidal ideation.  Patient was aggressively hydrated with IV fluid and was admitted, placed on suicide precaution. Patient electrolytes significantly improved, tolerating diet and diet improved being advanced.  Subjective:  Patient has been hypertensive and tachycardic. Tolerating soft diet She reports her HR had been high before  Denies any palpitation or chest pain C/o swelling LUE iv line was changed.-advised to keep elevated. Assessment & Plan:  Acute on chronic pancreatitis: In the setting of alcohol use. Patient overall feeling improved, tolerating soft diet.lfts/lipase downtrending.  Tachycardia/Hypertension- normla tsh. Suspect 2/2 dehydration, etoh withdrawals. S/p iv lopressor 5 mg, bolus with nss ivf 1 liter 3/28, increase lopressor po to 100 mg w/ holding parameters.  Increase her IV fluids.  Monitor.    Elevated LFTs/alcoholic hepatitis:improving.Monitor. Recent Labs  Lab 11/17/19 1715 11/18/19 0633 11/19/19 0314 11/20/19 0132  AST 75* 47* 50* 52*  ALT 52* 36 31 37  ALKPHOS 51 37* 35* 36*  BILITOT 2.1* 1.5* 1.2 1.1  PROT 9.7* 7.2 7.1 7.8  ALBUMIN 4.9 3.8 3.7 4.0   Acute kidney injury/hyperkalemia: Prerenal due to #1.  Resolved. Back on acei. Recent Labs  Lab 11/17/19 1715 11/18/19 0633 11/19/19 0314 11/20/19 0132  BUN 44* 34* 8 8  CREATININE 2.22* 1.24* 0.71 0.58   Lactic acidosis/metabolic acidosis anion gap due  to #1, lactic acidosis resolved.  Bicarb improved.   Cont ivf  Hypertension urgency/benign essential HTN: Poorly controlled-increased lisinopril to 40 mg daily, increase metoprolol 3/29. Cont as needed hydralazine/labetalol  ETOH abuse with withdrawal-cessation counselling. Cont ciwas ativan .  No tremors in hand alert,awake oriented x3. Cont ivf. Continue thiamine vitamins. Cont acamprosate per psych  Depression and suicide ideation on one-to-one suicide precautions will be discontinued since patient denies any suicidal ideation, she made a comment when she was stressed-seen by psychiatry, started on venlafaxine.Marland Kitchen  History of seizure disorder continue home Keppra  Hyperglycemia likely reactive.  Hemoglobin A1c is normal t 5.2  Thrombocytopnenia: likley from etoh abuse Recent Labs  Lab 11/17/19 1715 11/18/19 0633 11/19/19 0314 11/20/19 0132 11/21/19 0728  PLT 200 122* 95* 98* 86*    Nutrition: Diet Order            DIET SOFT Room service appropriate? Yes; Fluid consistency: Thin  Diet effective now             Body mass index is 22.22 kg/m.  DVT prophylaxis: hold Heparin 2/2 low paltelet Code Status:full  Family Communication: plan of care discussed with patient at bedside. Disposition Plan: Patient is from:home Anticipated Disposition: to  Home.   Barriers to discharge or conditions that needs to be met prior to discharge: Patient admitted with acute pancreatitis, alcohol withdrawal remains hospitalized for ongoing management of withdrawal, tachycardia.  Home once tolerating diet well and heart rate stabilizes hopefully next 1 to 2 days.  Consultants: psych Procedures:see note Microbiology:see note   Medications: Scheduled Meds: . acamprosate  333 mg Oral BID  . folic acid  1 mg Oral Daily  . heparin  5,000 Units  Subcutaneous Q8H  . levETIRAcetam  500 mg Oral BID  . lisinopril  40 mg Oral Daily  . LORazepam  0-4 mg Intravenous Q12H  . metoprolol tartrate  100 mg  Oral BID  . multivitamin with minerals  1 tablet Oral Daily  . pantoprazole  40 mg Oral Daily  . thiamine  100 mg Oral Daily   Or  . thiamine  100 mg Intravenous Daily  . venlafaxine XR  37.5 mg Oral Q breakfast   Continuous Infusions: . lactated ringers 75 mL/hr at 11/21/19 0852  . sodium chloride      Antimicrobials: Anti-infectives (From admission, onward)   None       Objective: Vitals: Today's Vitals   11/20/19 2135 11/20/19 2253 11/21/19 0731 11/21/19 0800  BP: (!) 135/100 (!) 152/110 (!) 173/118   Pulse:  (!) 116    Resp: (!) 25 18 20    Temp:  99.2 F (37.3 C) 98 F (36.7 C)   TempSrc:   Oral   SpO2:  100% 98%   Weight:      Height:      PainSc:  0-No pain  0-No pain    Intake/Output Summary (Last 24 hours) at 11/21/2019 0909 Last data filed at 11/21/2019 0500 Gross per 24 hour  Intake 1268 ml  Output --  Net 1268 ml   Filed Weights   11/18/19 0700 11/19/19 0625  Weight: 56.3 kg 55.1 kg   Weight change:    Intake/Output from previous day: 03/28 0701 - 03/29 0700 In: 1268 [I.V.:1268] Out: -  Intake/Output this shift: No intake/output data recorded.  Examination: General exam:AAOx3,NAD, HEENT:Oral mucosa moist, Ear/Nose WNL grossly,dentition normal. Respiratory system: bilaterally clear,no wheezing or crackles,no use of accessory muscle, non tender. Cardiovascular system: S1 & S2 +, regular, No JVD.  Tachycardic. Gastrointestinal system: Abdomen soft, mild central tenderness, no distention, bowel sounds present.   Nervous System:Alert, awake, moving extremities and grossly nonfocal Extremities: No edema, distal peripheral pulses palpable.  Skin: No rashes,no icterus. MSK: Normal muscle bulk,tone, power  Data Reviewed: I have personally reviewed following labs and imaging studies CBC: Recent Labs  Lab 11/17/19 1715 11/17/19 1715 11/17/19 2006 11/18/19 0633 11/19/19 0314 11/20/19 0132 11/21/19 0728  WBC 16.7*  --   --  10.1 5.7 5.5 7.1   NEUTROABS  --   --   --  7.9*  --   --   --   HGB 14.6   < > 17.0* 12.4 12.6 13.6 12.4  HCT 45.1   < > 50.0* 36.8 35.9* 38.8 36.0  MCV 111.6*  --   --  107.0* 102.9* 102.1* 103.4*  PLT 200  --   --  122* 95* 98* 86*   < > = values in this interval not displayed.   Basic Metabolic Panel: Recent Labs  Lab 11/17/19 1715 11/17/19 2002 11/17/19 2006 11/18/19 0633 11/19/19 0314 11/20/19 0132  NA 138  --  133* 139 136 135  K 5.6*  --  6.0* 4.6 3.6 3.9  CL 93*  --   --  104 99 99  CO2 7*  --   --  17* 25 22  GLUCOSE 212*  --   --  123* 112* 127*  BUN 44*  --   --  34* 8 8  CREATININE 2.22*  --   --  1.24* 0.71 0.58  CALCIUM 9.4  --   --  8.4* 9.3 9.6  MG  --  2.2  --   --   --   --  PHOS  --  6.1*  --   --   --   --    GFR: Estimated Creatinine Clearance: 81.3 mL/min (by C-G formula based on SCr of 0.58 mg/dL). Liver Function Tests: Recent Labs  Lab 11/17/19 1715 11/18/19 0633 11/19/19 0314 11/20/19 0132  AST 75* 47* 50* 52*  ALT 52* 36 31 37  ALKPHOS 51 37* 35* 36*  BILITOT 2.1* 1.5* 1.2 1.1  PROT 9.7* 7.2 7.1 7.8  ALBUMIN 4.9 3.8 3.7 4.0   Recent Labs  Lab 11/17/19 2002 11/19/19 0314 11/20/19 0132  LIPASE 963* 209* 112*   No results for input(s): AMMONIA in the last 168 hours. Coagulation Profile: No results for input(s): INR, PROTIME in the last 168 hours. Cardiac Enzymes: No results for input(s): CKTOTAL, CKMB, CKMBINDEX, TROPONINI in the last 168 hours. BNP (last 3 results) No results for input(s): PROBNP in the last 8760 hours. HbA1C: No results for input(s): HGBA1C in the last 72 hours. CBG: Recent Labs  Lab 11/18/19 1253 11/18/19 2012 11/20/19 0026 11/20/19 1150 11/21/19 0732  GLUCAP 108* 86 121* 126* 143*   Lipid Profile: No results for input(s): CHOL, HDL, LDLCALC, TRIG, CHOLHDL, LDLDIRECT in the last 72 hours. Thyroid Function Tests: Recent Labs    11/20/19 1122  TSH 1.370   Anemia Panel: No results for input(s): VITAMINB12, FOLATE,  FERRITIN, TIBC, IRON, RETICCTPCT in the last 72 hours. Sepsis Labs: Recent Labs  Lab 11/17/19 2002 11/18/19 9450  LATICACIDVEN 3.3* 1.0    Recent Results (from the past 240 hour(s))  Respiratory Panel by RT PCR (Flu A&B, Covid) - Nasopharyngeal Swab     Status: None   Collection Time: 11/17/19  9:43 PM   Specimen: Nasopharyngeal Swab  Result Value Ref Range Status   SARS Coronavirus 2 by RT PCR NEGATIVE NEGATIVE Final    Comment: (NOTE) SARS-CoV-2 target nucleic acids are NOT DETECTED. The SARS-CoV-2 RNA is generally detectable in upper respiratoy specimens during the acute phase of infection. The lowest concentration of SARS-CoV-2 viral copies this assay can detect is 131 copies/mL. A negative result does not preclude SARS-Cov-2 infection and should not be used as the sole basis for treatment or other patient management decisions. A negative result may occur with  improper specimen collection/handling, submission of specimen other than nasopharyngeal swab, presence of viral mutation(s) within the areas targeted by this assay, and inadequate number of viral copies (<131 copies/mL). A negative result must be combined with clinical observations, patient history, and epidemiological information. The expected result is Negative. Fact Sheet for Patients:  PinkCheek.be Fact Sheet for Healthcare Providers:  GravelBags.it This test is not yet ap proved or cleared by the [place] FDA and  has been authorized for detection and/or diagnosis of SARS-CoV-2 by FDA under an Emergency Use Authorization (EUA). This EUA will remain  in effect (meaning this test can be used) for the duration of the COVID-19 declaration under Section 564(b)(1) of the Act, 21 U.S.C. section 360bbb-3(b)(1), unless the authorization is terminated or revoked sooner.    Influenza A by PCR NEGATIVE NEGATIVE Final   Influenza B by PCR NEGATIVE NEGATIVE  Final    Comment: (NOTE) The Xpert Xpress SARS-CoV-2/FLU/RSV assay is intended as an aid in  the diagnosis of influenza from Nasopharyngeal swab specimens and  should not be used as a sole basis for treatment. Nasal washings and  aspirates are unacceptable for Xpert Xpress SARS-CoV-2/FLU/RSV  testing. Fact Sheet for Patients: PinkCheek.be Fact Sheet for Healthcare Providers: GravelBags.it This  test is not yet approved or cleared by the [place] and  has been authorized for detection and/or diagnosis of SARS-CoV-2 by  FDA under an Emergency Use Authorization (EUA). This EUA will remain  in effect (meaning this test can be used) for the duration of the  Covid-19 declaration under Section 564(b)(1) of the Act, 21  U.S.C. section 360bbb-3(b)(1), unless the authorization is  terminated or revoked. Performed at [facility], [place] [address] [place], [place] [address]       Radiology Studies: No results found.   LOS: 4 days   Time spent: More than 50% of that time was spent in counseling and/or coordination of care.  Antonieta Pert, MD [facility]  11/21/2019, 9:09 AM

## 2019-11-21 NOTE — Plan of Care (Signed)

## 2019-11-22 ENCOUNTER — Telehealth: Payer: Self-pay | Admitting: Family Medicine

## 2019-11-22 LAB — CBC
HCT: 32.7 % — ABNORMAL LOW (ref 36.0–46.0)
Hemoglobin: 11.3 g/dL — ABNORMAL LOW (ref 12.0–15.0)
MCH: 35.6 pg — ABNORMAL HIGH (ref 26.0–34.0)
MCHC: 34.6 g/dL (ref 30.0–36.0)
MCV: 103.2 fL — ABNORMAL HIGH (ref 80.0–100.0)
Platelets: 100 10*3/uL — ABNORMAL LOW (ref 150–400)
RBC: 3.17 MIL/uL — ABNORMAL LOW (ref 3.87–5.11)
RDW: 10.9 % — ABNORMAL LOW (ref 11.5–15.5)
WBC: 6.4 10*3/uL (ref 4.0–10.5)
nRBC: 0 % (ref 0.0–0.2)

## 2019-11-22 LAB — COMPREHENSIVE METABOLIC PANEL
ALT: 48 U/L — ABNORMAL HIGH (ref 0–44)
AST: 77 U/L — ABNORMAL HIGH (ref 15–41)
Albumin: 3.5 g/dL (ref 3.5–5.0)
Alkaline Phosphatase: 29 U/L — ABNORMAL LOW (ref 38–126)
Anion gap: 10 (ref 5–15)
BUN: 12 mg/dL (ref 6–20)
CO2: 25 mmol/L (ref 22–32)
Calcium: 9.6 mg/dL (ref 8.9–10.3)
Chloride: 100 mmol/L (ref 98–111)
Creatinine, Ser: 0.87 mg/dL (ref 0.44–1.00)
GFR calc Af Amer: >60 mL/min (ref >60)
GFR calc non Af Amer: >60 mL/min (ref >60)
Glucose, Bld: 109 mg/dL — ABNORMAL HIGH (ref 70–99)
Potassium: 3.8 mmol/L (ref 3.5–5.1)
Sodium: 135 mmol/L (ref 135–145)
Total Bilirubin: 0.8 mg/dL (ref 0.3–1.2)
Total Protein: 6.7 g/dL (ref 6.5–8.1)

## 2019-11-22 LAB — GLUCOSE, CAPILLARY: Glucose-Capillary: 99 mg/dL (ref 70–99)

## 2019-11-22 LAB — LIPASE, BLOOD: Lipase: 48 U/L (ref 11–51)

## 2019-11-22 MED ORDER — METOPROLOL TARTRATE 100 MG PO TABS: 100.0000 mg | ORAL_TABLET | Freq: Two times a day (BID) | ORAL | 0 refills | Status: DC

## 2019-11-22 MED ORDER — PANTOPRAZOLE SODIUM 40 MG PO TBEC: 40.0000 mg | DELAYED_RELEASE_TABLET | Freq: Every day | ORAL | 0 refills | Status: DC

## 2019-11-22 MED ORDER — FOLIC ACID 1 MG PO TABS: 1.0000 mg | ORAL_TABLET | Freq: Every day | ORAL | 0 refills | Status: DC

## 2019-11-22 MED ORDER — THIAMINE HCL 100 MG PO TABS: 100.0000 mg | ORAL_TABLET | Freq: Every day | ORAL | 0 refills | Status: AC

## 2019-11-22 MED ORDER — LISINOPRIL 40 MG PO TABS: 40.0000 mg | ORAL_TABLET | Freq: Every day | ORAL | 0 refills | Status: DC

## 2019-11-22 NOTE — TOC Progression Note (Signed)
Transition of Care [facility]) - Progression Note    Patient Details  Name: Donna Short MRN: [id number] Date of Birth: [birth year]  Transition of Care [facility]) CM/SW Contact  Jimmy Picket, [place] Phone Number: [phone] 11/22/2019, 1:14 PM  Clinical Narrative:     CSW visited pt, and was unable to wake her. CSW left alcohol cessation resources in room for pt.     Jimmy Picket, Theresia Majors, LCASA Licensed Visual merchandiser

## 2019-11-22 NOTE — Telephone Encounter (Signed)
Pt is getting discharged from hospital today. Called to request a hospital follow up appt so she can go back to work. Dr. Artis Flock does not have any availability for 2 weeks. Can this be scheduled in a same day slot? Please advise.

## 2019-11-22 NOTE — Discharge Summary (Signed)
Physician Discharge Summary  Donna Short [id number]:[id number] DOB: [birth year] DOA: 11/17/2019  PCP: Orland Mustard, MD  Admit date: 11/17/2019 Discharge date: 11/22/2019  Admitted From: home Disposition:  home  Recommendations for Outpatient Follow-up:  1. Follow up with PCP in 1-2 weeks 2. Please obtain BMP/CBC in one week 3. Please follow up on the following pending results:  Home Health:*no Equipment/Devices: none  Discharge Condition: Stable Code Status:full Diet recommendation: Heart Healthy  Brief/Interim Summary:  29 year old female with history of alcohol abuse with recurrent alcohol induced pancreatitis, hypertension, seizure disorder presents to the ER with epigastric pain and associated recurrent episodes of vomiting x24 hours.  In the ED labs showed elevated lactic acid, low bicarb hyperkalemia renal failure elevated LFTs hyperglycemia, leukocytosis.  Discharge negative, acute abdomen series was negative, patient also endorsed suicidal ideation.  Patient was aggressively hydrated with IV fluid and was admitted, placed on suicide precaution. Patient electrolytes significantly improved, tolerating diet and diet improved being advanced. At this time tolerating diet, hemodynamically stable tachycardia resolved with IV fluids and medication adjustment.  Blood pressure also stabilized.  We will continue on Lopressor 100 mg and lisinopril 40 mg. patient will need continued follow-up with her PCP to adjust her blood pressure medication, advised her to hold off on her blood pressure and go back to previous dose if heart rate starts to go lower than 60 or blood pressure systolic less than 110.  Discharge Diagnoses:  Acute on chronic pancreatitis: In the setting of alcohol use.  Resolved.  Lipase improved, patient tolerating diet.    Tachycardia/Hypertension- normla tsh. Suspect 2/2 dehydration, etoh withdrawals.  Improved after adjusting medication and with IV fluids.  She will continue  Lopressor 100 mg twice daily.   Elevated LFTs/alcoholic hepatitis:improving.Monitor. Last Labs         Recent Labs  Lab 11/17/19 1715 11/18/19 [id number] 11/19/19 0314 11/20/19 0132  AST 75* 47* 50* 52*  ALT 52* 36 31 37  ALKPHOS 51 37* 35* 36*  BILITOT 2.1* 1.5* 1.2 1.1  PROT 9.7* 7.2 7.1 7.8  ALBUMIN 4.9 3.8 3.7 4.0     Acute kidney injury/hyperkalemia: Prerenal due to #1.  Resolved. Back on acei. Last Labs         Recent Labs  Lab 11/17/19 1715 11/18/19 [id number] 11/19/19 0314 11/20/19 0132  BUN 44* 34* 8 8  CREATININE 2.22* 1.24* 0.71 0.58     Lactic acidosis/metabolic acidosis anion gap due to #1, lactic acidosis resolved.  Bicarb improved.   Cont ivf  Hypertension urgency/benign essential HTN: Poorly controlled-increased lisinopril to 40 mg daily, increase metoprolol 3/29. Cont as needed hydralazine/labetalol  ETOH abuse with withdrawal-cessation counselling.No tremors in hand alert,awake oriented x3.  No signs of withdrawal at this time.Continue thiamine vitamins.  Reluctant to continue acamprosate due to nausea.  Social worker consulted to help with outpatient resources for alcohol abuse.  Depression and suicide ideation on one-to-one suicide precautions have been discontinued.  Mood is stable.  Patient reports nausea with venlafaxine she discontinued and she is going to follow-up with her pcp/psych. Hold off on new meds 2/2 nausea/hospitalization/pancreatitis.  History of seizure disorder continue home Keppra  Hyperglycemia likely reactive.  Hemoglobin A1c is normal t 5.2  Thrombocytopnenia: likley from etoh abuse Last Labs          Recent Labs  Lab 11/17/19 1715 11/18/19 1497 11/19/19 0314 11/20/19 0132 11/21/19 0728  PLT 200 122* 95* 98* 86*        Consults: Child psychotherapist  Subjective: Tolerating diet denies chest pain nausea vomiting fever chills.  No suicidal ideation.  Mood is stable. Discharge Exam: Vitals:   11/21/19 2355 11/22/19 0052   BP: (!) 136/102   Pulse: (!) 107   Resp: (!) 27 18  Temp: 98.6 F (37 C)   SpO2:     General: Pt is alert, awake, not in acute distress Cardiovascular: RRR, S1/S2 +, no rubs, no gallops Respiratory: CTA bilaterally, no wheezing, no rhonchi Abdominal: Soft, NT, ND, bowel sounds + Extremities: no edema, no cyanosis  Discharge Instructions  Discharge Instructions    Diet - low sodium heart healthy   Complete by: As directed    Discharge instructions   Complete by: As directed    Please follow-up with the primary care doctor to monitor blood pressure and adjust medication as you may need to go down or up on the current medications  Please call call MD or return to ER for similar or worsening recurring problem that brought you to hospital or if any fever,nausea/vomiting,abdominal pain, uncontrolled pain, chest pain,  shortness of breath or any other alarming symptoms.  Please follow-up your doctor as instructed in a week time and call the office for appointment.  Please avoid alcohol, smoking, or any other illicit substance and maintain healthy habits including taking your regular medications as prescribed.  You were cared for by a hospitalist during your hospital stay. If you have any questions about your discharge medications or the care you received while you were in the hospital after you are discharged, you can call the unit and ask to speak with the hospitalist on call if the hospitalist that took care of you is not available.  Once you are discharged, your primary care physician will handle any further medical issues. Please note that NO REFILLS for any discharge medications will be authorized once you are discharged, as it is imperative that you return to your primary care physician (or establish a relationship with a primary care physician if you do not have one) for your aftercare needs so that they can reassess your need for medications and monitor your lab values.   Increase  activity slowly   Complete by: As directed      Allergies as of 11/22/2019      Reactions   Claritin [loratadine] Itching   Peanut-containing Drug Products Other (See Comments)   Benadryl [diphenhydramine Hcl (sleep)] Rash   Latex Rash   Penicillins Itching, Rash   Has patient had a PCN reaction causing immediate rash, facial/tongue/throat swelling, SOB or lightheadedness with hypotension: Yes Has patient had a PCN reaction causing severe rash involving mucus membranes or skin necrosis: No Has patient had a PCN reaction that required hospitalization: Yes Has patient had a PCN reaction occurring within the last 10 years: No If all of the above answers are "NO", then may proceed with Cephalosporin use.;   Shrimp [shellfish Allergy] Hives, Rash      Medication List    STOP taking these medications   baclofen 10 MG tablet Commonly known as: LIORESAL   potassium chloride SA 20 MEQ tablet Commonly known as: KLOR-CON     TAKE these medications   acetaminophen 500 MG tablet Commonly known as: TYLENOL Take 1,000 mg by mouth every 6 (six) hours as needed for mild pain.   albuterol 108 (90 Base) MCG/ACT inhaler Commonly known as: VENTOLIN HFA Inhale 1-2 puffs into the lungs every 6 (six) hours as needed for wheezing or shortness of breath.  folic acid 1 MG tablet Commonly known as: FOLVITE Take 1 tablet (1 mg total) by mouth daily.   ibuprofen 600 MG tablet Commonly known as: ADVIL Take 1 tablet (600 mg total) by mouth every 8 (eight) hours as needed. What changed: reasons to take this   levETIRAcetam 500 MG tablet Commonly known as: Keppra Take one tablet twice daily - twelve hours apart.  PCP will need to provide future refills. What changed:   how much to take  how to take this  when to take this  additional instructions   lisinopril 40 MG tablet Commonly known as: ZESTRIL Take 1 tablet (40 mg total) by mouth daily. What changed:   medication strength  how  much to take   metoprolol tartrate 100 MG tablet Commonly known as: LOPRESSOR Take 1 tablet (100 mg total) by mouth 2 (two) times daily. What changed:   medication strength  how much to take   pantoprazole 40 MG tablet Commonly known as: PROTONIX Take 1 tablet (40 mg total) by mouth daily.   thiamine 100 MG tablet Take 1 tablet (100 mg total) by mouth daily.      Follow-up Information    Orland Mustard, MD Follow up in 1 week(s).   Specialty: Family Medicine Contact information: [address] [place] [address] [phone]          Allergies  Allergen Reactions  . Claritin [Loratadine] Itching  . Peanut-Containing Drug Products Other (See Comments)  . Benadryl [Diphenhydramine Hcl (Sleep)] Rash  . Latex Rash  . Penicillins Itching and Rash    Has patient had a PCN reaction causing immediate rash, facial/tongue/throat swelling, SOB or lightheadedness with hypotension: Yes Has patient had a PCN reaction causing severe rash involving mucus membranes or skin necrosis: No Has patient had a PCN reaction that required hospitalization: Yes Has patient had a PCN reaction occurring within the last 10 years: No If all of the above answers are "NO", then may proceed with Cephalosporin use.;  . Shrimp [Shellfish Allergy] Hives and Rash    The results of significant diagnostics from this hospitalization (including imaging, microbiology, ancillary and laboratory) are listed below for reference.    Microbiology: Recent Results (from the past 240 hour(s))  Respiratory Panel by RT PCR (Flu A&B, Covid) - Nasopharyngeal Swab     Status: None   Collection Time: 11/17/19  9:43 PM   Specimen: Nasopharyngeal Swab  Result Value Ref Range Status   SARS Coronavirus 2 by RT PCR NEGATIVE NEGATIVE Final    Comment: (NOTE) SARS-CoV-2 target nucleic acids are NOT DETECTED. The SARS-CoV-2 RNA is generally detectable in upper respiratoy specimens during the acute phase of  infection. The lowest concentration of SARS-CoV-2 viral copies this assay can detect is 131 copies/mL. A negative result does not preclude SARS-Cov-2 infection and should not be used as the sole basis for treatment or other patient management decisions. A negative result may occur with  improper specimen collection/handling, submission of specimen other than nasopharyngeal swab, presence of viral mutation(s) within the areas targeted by this assay, and inadequate number of viral copies (<131 copies/mL). A negative result must be combined with clinical observations, patient history, and epidemiological information. The expected result is Negative. Fact Sheet for Patients:  https://www.moore.com/ Fact Sheet for Healthcare Providers:  https://www.young.biz/ This test is not yet ap proved or cleared by the [place] FDA and  has been authorized for detection and/or diagnosis of SARS-CoV-2 by FDA under an Emergency Use Authorization (  EUA). This EUA will remain  in effect (meaning this test can be used) for the duration of the COVID-19 declaration under Section 564(b)(1) of the Act, 21 U.S.C. section 360bbb-3(b)(1), unless the authorization is terminated or revoked sooner.    Influenza A by PCR NEGATIVE NEGATIVE Final   Influenza B by PCR NEGATIVE NEGATIVE Final    Comment: (NOTE) The Xpert Xpress SARS-CoV-2/FLU/RSV assay is intended as an aid in  the diagnosis of influenza from Nasopharyngeal swab specimens and  should not be used as a sole basis for treatment. Nasal washings and  aspirates are unacceptable for Xpert Xpress SARS-CoV-2/FLU/RSV  testing. Fact Sheet for Patients: https://www.moore.com/https://www.fda.gov/media/142436/download Fact Sheet for Healthcare Providers: https://www.young.biz/https://www.fda.gov/media/142435/download This test is not yet approved or cleared by the [place] FDA and  has been authorized for detection and/or diagnosis of SARS-CoV-2 by  FDA under  an Emergency Use Authorization (EUA). This EUA will remain  in effect (meaning this test can be used) for the duration of the  Covid-19 declaration under Section 564(b)(1) of the Act, 21  U.S.C. section 360bbb-3(b)(1), unless the authorization is  terminated or revoked. Performed at [facility], [address] [place], [place] [address]     Procedures/Studies: DG ABD ACUTE 2+V W 1V CHEST  Result Date: 11/18/2019 CLINICAL DATA:  Abdominal pain EXAM: DG ABDOMEN ACUTE W/ 1V CHEST COMPARISON:  02/24/2019 FINDINGS: There is no evidence of dilated bowel loops or free intraperitoneal air. No radiopaque calculi or other significant radiographic abnormality is seen. Heart size and mediastinal contours are within normal limits. Both lungs are clear. IMPRESSION: Negative abdominal radiographs.  No acute cardiopulmonary disease. Electronically Signed   By: Charlett NoseKevin  Dover M.D.   On: 11/18/2019 02:38    Labs: BNP (last 3 results) No results for input(s): BNP in the last 8760 hours. Basic Metabolic Panel: Recent Labs  Lab 11/17/19 2002 11/17/19 2006 11/18/19 [id number] 11/19/19 0314 11/20/19 0132 11/21/19 0728 11/22/19 0327  NA  --    < > 139 136 135 132* 135  K  --    < > 4.6 3.6 3.9 3.5 3.8  CL  --    < > 104 99 99 96* 100  CO2  --    < > 17* 25 22 24 25   GLUCOSE  --    < > 123* 112* 127* 143* 109*  BUN  --    < > 34* 8 8 8 12   CREATININE  --    < > 1.24* 0.71 0.58 0.71 0.87  CALCIUM  --    < > 8.4* 9.3 9.6 9.5 9.6  MG 2.2  --   --   --   --   --   --   PHOS 6.1*  --   --   --   --   --   --    < > = values in this interval not displayed.   Liver Function Tests: Recent Labs  Lab 11/18/19 [id number] 11/19/19 0314 11/20/19 0132 11/21/19 0728 11/22/19 0327  AST 47* 50* 52* 72* 77*  ALT 36 31 37 42 48*  ALKPHOS 37* 35* 36* 31* 29*  BILITOT 1.5* 1.2 1.1 0.9 0.8  PROT 7.2 7.1 7.8 7.2 6.7  ALBUMIN 3.8 3.7 4.0 3.9 3.5   Recent Labs  Lab 11/17/19 2002 11/19/19 0314 11/20/19 0132  11/21/19 0728 11/22/19 0327  LIPASE 963* 209* 112* 94* 48   No results for input(s): AMMONIA in the last 168 hours. CBC: Recent Labs  Lab 11/18/19 [id number] 11/19/19 0314 11/20/19 0132 11/21/19 0728 11/22/19 0327  WBC 10.1 5.7 5.5 7.1 6.4  NEUTROABS 7.9*  --   --   --   --   HGB 12.4 12.6 13.6 12.4 11.3*  HCT 36.8 35.9* 38.8 36.0 32.7*  MCV 107.0* 102.9* 102.1* 103.4* 103.2*  PLT 122* 95* 98* 86* 100*   Cardiac Enzymes: No results for input(s): CKTOTAL, CKMB, CKMBINDEX, TROPONINI in the last 168 hours. BNP: Invalid input(s): POCBNP CBG: Recent Labs  Lab 11/20/19 1150 11/21/19 0732 11/21/19 1650 11/21/19 2219 11/22/19 0558  GLUCAP 126* 143* 102* 141* 99   D-Dimer No results for input(s): DDIMER in the last 72 hours. Hgb A1c No results for input(s): HGBA1C in the last 72 hours. Lipid Profile No results for input(s): CHOL, HDL, LDLCALC, TRIG, CHOLHDL, LDLDIRECT in the last 72 hours. Thyroid function studies Recent Labs    11/20/19 1122  TSH 1.370   Anemia work up No results for input(s): VITAMINB12, FOLATE, FERRITIN, TIBC, IRON, RETICCTPCT in the last 72 hours. Urinalysis    Component Value Date/Time   COLORURINE YELLOW 08/27/2019 1322   APPEARANCEUR HAZY (A) 08/27/2019 1322   LABSPEC 1.024 08/27/2019 1322   PHURINE 6.0 08/27/2019 1322   GLUCOSEU NEGATIVE 08/27/2019 1322   HGBUR MODERATE (A) 08/27/2019 1322   BILIRUBINUR SMALL (A) 08/27/2019 1322   KETONESUR 80 (A) 08/27/2019 1322   PROTEINUR >=300 (A) 08/27/2019 1322   UROBILINOGEN 0.2 10/07/2015 1958   NITRITE NEGATIVE 08/27/2019 1322   LEUKOCYTESUR NEGATIVE 08/27/2019 1322   Sepsis Labs Invalid input(s): PROCALCITONIN,  WBC,  LACTICIDVEN Microbiology Recent Results (from the past 240 hour(s))  Respiratory Panel by RT PCR (Flu A&B, Covid) - Nasopharyngeal Swab     Status: None   Collection Time: 11/17/19  9:43 PM   Specimen: Nasopharyngeal Swab  Result Value Ref Range Status   SARS Coronavirus 2 by  RT PCR NEGATIVE NEGATIVE Final    Comment: (NOTE) SARS-CoV-2 target nucleic acids are NOT DETECTED. The SARS-CoV-2 RNA is generally detectable in upper respiratoy specimens during the acute phase of infection. The lowest concentration of SARS-CoV-2 viral copies this assay can detect is 131 copies/mL. A negative result does not preclude SARS-Cov-2 infection and should not be used as the sole basis for treatment or other patient management decisions. A negative result may occur with  improper specimen collection/handling, submission of specimen other than nasopharyngeal swab, presence of viral mutation(s) within the areas targeted by this assay, and inadequate number of viral copies (<131 copies/mL). A negative result must be combined with clinical observations, patient history, and epidemiological information. The expected result is Negative. Fact Sheet for Patients:  PinkCheek.be Fact Sheet for Healthcare Providers:  GravelBags.it This test is not yet ap proved or cleared by the [place] FDA and  has been authorized for detection and/or diagnosis of SARS-CoV-2 by FDA under an Emergency Use Authorization (EUA). This EUA will remain  in effect (meaning this test can be used) for the duration of the COVID-19 declaration under Section 564(b)(1) of the Act, 21 U.S.C. section 360bbb-3(b)(1), unless the authorization is terminated or revoked sooner.    Influenza A by PCR NEGATIVE NEGATIVE Final   Influenza B by PCR NEGATIVE NEGATIVE Final    Comment: (NOTE) The Xpert Xpress SARS-CoV-2/FLU/RSV assay is intended as an aid in  the diagnosis of influenza from Nasopharyngeal swab specimens and  should not be used as a sole basis for treatment. Nasal washings and  aspirates are unacceptable for  Xpert Xpress SARS-CoV-2/FLU/RSV  testing. Fact Sheet for Patients: https://www.moore.com/ Fact Sheet for Healthcare  Providers: https://www.young.biz/ This test is not yet approved or cleared by the [place] FDA and  has been authorized for detection and/or diagnosis of SARS-CoV-2 by  FDA under an Emergency Use Authorization (EUA). This EUA will remain  in effect (meaning this test can be used) for the duration of the  Covid-19 declaration under Section 564(b)(1) of the Act, 21  U.S.C. section 360bbb-3(b)(1), unless the authorization is  terminated or revoked. Performed at [facility], [address] [place], [place] [address]      Time coordinating discharge: 25 minutes  SIGNED: Lanae Boast, MD  [facility] 11/22/2019, 2:36 PM  If 7PM-7AM, please contact night-coverage [email or web address]

## 2019-11-22 NOTE — Telephone Encounter (Signed)
Please schedule Thursday, 40 min slot.    Thank you

## 2019-11-22 NOTE — Progress Notes (Signed)
Pt has been refusing acamprosate for the past few days stating that it makes her nauseated/vomit. Pt given education on the medication, and pt verbalized refusal at this time.

## 2019-11-22 NOTE — Discharge Instructions (Signed)
Acute Pancreatitis    Acute pancreatitis happens when the pancreas gets swollen. The pancreas is a large gland in the body that helps to control blood sugar. It also makes enzymes that help to digest food.  This condition can last a few days and cause serious problems. The lungs, heart, and kidneys may stop working.  What are the causes?  Causes include:  · Alcohol abuse.  · Drug abuse.  · Gallstones.  · A tumor in the pancreas.  Other causes include:  · Some medicines.  · Some chemicals.  · Diabetes.  · An infection.  · Damage caused by an accident.  · The poison (venom) from a scorpion bite.  · Belly (abdominal) surgery.  · The body's defense system (immune system) attacking the pancreas (autoimmune pancreatitis).  · Genes that are passed from parent to child (inherited).  In some cases, the cause is not known.  What are the signs or symptoms?  · Pain in the upper belly that may be felt in the back. The pain may be very bad.  · Swelling of the belly.  · Feeling sick to your stomach (nauseous) and throwing up (vomiting).  · Fever.  How is this treated?  You will likely have to stay in the hospital. Treatment may include:  · Pain medicine.  · Fluid through an IV tube.  · Placing a tube in the stomach to take out the stomach contents. This may help you stop throwing up.  · Not eating for 3-4 days.  · Antibiotic medicines, if you have an infection.  · Treating any other problems that may be the cause.  · Steroid medicines, if your problem is caused by your defense system attacking your body's own tissues.  · Surgery.  Follow these instructions at home:  Eating and drinking    · Follow instructions from your doctor about what to eat and drink.  · Eat foods that do not have a lot of fat in them.  · Eat small meals often. Do not eat big meals.  · Drink enough fluid to keep your pee (urine) pale yellow.  · Do not drink alcohol if it caused your condition.  Medicines  · Take over-the-counter and prescription medicines only  as told by your doctor.  · Ask your doctor if the medicine prescribed to you:  ? Requires you to avoid driving or using heavy machinery.  ? Can cause trouble pooping (constipation). You may need to take steps to prevent or treat trouble pooping:  § Take over-the-counter or prescription medicines.  § Eat foods that are high in fiber. These include beans, whole grains, and fresh fruits and vegetables.  § Limit foods that are high in fat and sugar. These include fried or sweet foods.  General instructions  · Do not use any products that contain nicotine or tobacco, such as cigarettes, e-cigarettes, and chewing tobacco. If you need help quitting, ask your doctor.  · Get plenty of rest.  · Check your blood sugar at home as told by your doctor.  · Keep all follow-up visits as told by your doctor. This is important.  Contact a doctor if:  · You do not get better as quickly as expected.  · You have new symptoms.  · Your symptoms get worse.  · You have pain or weakness that lasts a long time.  · You keep feeling sick to your stomach.  · You get better and then you have pain again.  ·   You have a fever.  Get help right away if:  · You cannot eat or keep fluids down.  · Your pain gets very bad.  · Your skin or the white part of your eyes turns yellow.  · You have sudden swelling in your belly.  · You throw up.  · You feel dizzy or you pass out (faint).  · Your blood sugar is high (over 300 mg/dL).  Summary  · Acute pancreatitis happens when the pancreas gets swollen.  · This condition is often caused by alcohol abuse, drug abuse, or gallstones.  · You will likely have to stay in the hospital for treatment.  This information is not intended to replace advice given to you by your health care provider. Make sure you discuss any questions you have with your health care provider.  Document Revised: 05/31/2018 Document Reviewed: 05/31/2018  Elsevier Patient Education © 2020 [employer].

## 2019-11-22 NOTE — Telephone Encounter (Signed)
Scheduled hospital f/u

## 2019-11-24 ENCOUNTER — Other Ambulatory Visit: Payer: Self-pay

## 2019-11-24 ENCOUNTER — Ambulatory Visit (INDEPENDENT_AMBULATORY_CARE_PROVIDER_SITE_OTHER): Payer: Managed Care, Other (non HMO) | Admitting: Family Medicine

## 2019-11-24 ENCOUNTER — Encounter: Payer: Self-pay | Admitting: Family Medicine

## 2019-11-24 VITALS — BP 120/76 | HR 63 | Temp 97.3°F | Ht 62.0 in | Wt 125.4 lb

## 2019-11-24 DIAGNOSIS — K861 Other chronic pancreatitis: Secondary | ICD-10-CM | POA: Diagnosis not present

## 2019-11-24 DIAGNOSIS — K859 Acute pancreatitis without necrosis or infection, unspecified: Secondary | ICD-10-CM | POA: Diagnosis not present

## 2019-11-24 DIAGNOSIS — F1010 Alcohol abuse, uncomplicated: Secondary | ICD-10-CM

## 2019-11-24 DIAGNOSIS — F339 Major depressive disorder, recurrent, unspecified: Secondary | ICD-10-CM | POA: Diagnosis not present

## 2019-11-24 DIAGNOSIS — I10 Essential (primary) hypertension: Secondary | ICD-10-CM | POA: Diagnosis not present

## 2019-11-24 DIAGNOSIS — R000 Tachycardia, unspecified: Secondary | ICD-10-CM

## 2019-11-24 LAB — CBC WITH DIFFERENTIAL/PLATELET
Basophils Absolute: 0 10*3/uL (ref 0.0–0.1)
Basophils Relative: 0.6 % (ref 0.0–3.0)
Eosinophils Absolute: 0.1 10*3/uL (ref 0.0–0.7)
Eosinophils Relative: 1.1 % (ref 0.0–5.0)
HCT: 34.5 % — ABNORMAL LOW (ref 36.0–46.0)
Hemoglobin: 11.8 g/dL — ABNORMAL LOW (ref 12.0–15.0)
Lymphocytes Relative: 19.4 % (ref 12.0–46.0)
Lymphs Abs: 1.5 10*3/uL (ref 0.7–4.0)
MCHC: 34.1 g/dL (ref 30.0–36.0)
MCV: 105.9 fl — ABNORMAL HIGH (ref 78.0–100.0)
Monocytes Absolute: 0.8 10*3/uL (ref 0.1–1.0)
Monocytes Relative: 10.8 % (ref 3.0–12.0)
Neutro Abs: 5.2 10*3/uL (ref 1.4–7.7)
Neutrophils Relative %: 68.1 % (ref 43.0–77.0)
Platelets: 208 10*3/uL (ref 150.0–400.0)
RBC: 3.25 Mil/uL — ABNORMAL LOW (ref 3.87–5.11)
RDW: 12.4 % (ref 11.5–15.5)
WBC: 7.7 10*3/uL (ref 4.0–10.5)

## 2019-11-24 LAB — COMPREHENSIVE METABOLIC PANEL
ALT: 69 U/L — ABNORMAL HIGH (ref 0–35)
AST: 75 U/L — ABNORMAL HIGH (ref 0–37)
Albumin: 4.2 g/dL (ref 3.5–5.2)
Alkaline Phosphatase: 33 U/L — ABNORMAL LOW (ref 39–117)
BUN: 18 mg/dL (ref 6–23)
CO2: 25 mEq/L (ref 19–32)
Calcium: 9.3 mg/dL (ref 8.4–10.5)
Chloride: 99 mEq/L (ref 96–112)
Creatinine, Ser: 0.92 mg/dL (ref 0.40–1.20)
GFR: 86.23 mL/min (ref >60.00)
Glucose, Bld: 79 mg/dL (ref 70–99)
Potassium: 5 mEq/L (ref 3.5–5.1)
Sodium: 133 mEq/L — ABNORMAL LOW (ref 135–145)
Total Bilirubin: 0.3 mg/dL (ref 0.2–1.2)
Total Protein: 7 g/dL (ref 6.0–8.3)

## 2019-11-24 MED ORDER — METOPROLOL TARTRATE 50 MG PO TABS: 50.0000 mg | ORAL_TABLET | Freq: Two times a day (BID) | ORAL | 3 refills | Status: DC

## 2019-11-24 MED ORDER — FLUOXETINE HCL 20 MG PO TABS: 20.0000 mg | ORAL_TABLET | Freq: Every day | ORAL | 3 refills | Status: DC

## 2019-11-24 NOTE — Patient Instructions (Addendum)
1) I changed your metoprolol to 50mg  instead of 100mg  since your heart rate is low. I sent in a new prescription.   2) everything else stays the same.   3) starting a medication for depression called prozac. im doing low dose at 20mg /day. Will take this every Am. Will see you back in one month for follow up. Any suicidal thought call 911 or go to ER.   4) checking labs today as well.   Call me if any questions.  Happy easter! Dr. 

## 2019-11-24 NOTE — Progress Notes (Signed)
Patient: Donna Short MRN: [id number] DOB: [birth year] PCP: Orma Flaming, MD     Subjective:  Chief Complaint  Patient presents with  . Hospitalization Follow-up    Pt says that she is feeling better.  . Pancreatitis    HPI: The patient is a 29 y.o. female who presents today for hospitalization follow up.   Admit date: 11/17/2019 Discharge date: 11/22/2019  Brief summary: presented to ER with epigastric pain and associated recurrent episodes of vomiting x 24 hours. In ED labs showed elevated lactic acid, low bicarb, hyperkalemia and AKI. She also had elevated LFTs and leukocytosis. She also endorsed suicidal ideation. IVF were started and electrolytes improved significnatly.   1) acute on chronic pancreatitis: repetitive alcohol abuse. IVF, npo. Diet advanced and eating on discharge. Lipase elevated to 112 and resolved to normal on discharge. No pain anymore today.   2) tachycardia: normally on beta blocker. Discharged on lopressor 100mg  bid. tsh normal. Denies any fatigue, drowsiness.   3) htn: discharged on lopressor 100mg  bid and lisinopril 40mg . Her lisinopril was increased to 40mg  in the hospital. Controlled at discharge.   4) lactic acidosis/metabolic acidosis anion gap: due to #1. Resoled with IVF. a1c wnl.   5) depression and suicide ideation: was placed one to one suicide precautions and were discontinued. Psychiatry was consulted. Was started on campral 333mg  and effexor, but both stopped due to nausea. She states she never was suicidal and that the hospital took it the wrong way. She has no si/hi/ah/vh. This is her second time to having suicidal ideations.   6) etoh abuse: no withdrawal symptoms. Thiamine vitamins. sw consulted to help with outpatient resources for alcohol abuse. Has paperwork, but has not reached out for this. States she is done drinking alcohol. Taking her folic acid, but was told she needs to get thiamine on [place].   Review of Systems   Constitutional: Negative for chills, fatigue and fever.  HENT: Negative for dental problem, ear pain, hearing loss and trouble swallowing.   Eyes: Negative for visual disturbance.  Respiratory: Negative for cough, chest tightness and shortness of breath.   Cardiovascular: Negative for chest pain, palpitations and leg swelling.  Gastrointestinal: Negative for abdominal pain, blood in stool, diarrhea and nausea.  Endocrine: Negative for cold intolerance, polydipsia, polyphagia and polyuria.  Genitourinary: Negative for dysuria and hematuria.  Musculoskeletal: Negative for arthralgias.  Skin: Negative for rash.  Neurological: Negative for dizziness and headaches.  Psychiatric/Behavioral: Negative for dysphoric mood, sleep disturbance and suicidal ideas. The patient is not nervous/anxious.     Allergies Patient is allergic to claritin [loratadine]; peanut-containing drug products; benadryl [diphenhydramine hcl (sleep)]; latex; penicillins; and shrimp [shellfish allergy].  Past Medical History Patient  has a past medical history of Asthma, History of chicken pox, Hypertension, Migraines, Pancreatitis (01/2018), and Seizures ([place]).  Surgical History Patient  has a past surgical history that includes Wisdom tooth extraction.  Family History Pateint's family history includes Alcohol abuse in her father; Arthritis in her father; Asthma in her mother; Breast cancer in her mother; Diabetes in her father; Drug abuse in her mother; Hypertension in her mother.  Social History Patient  reports that she quit smoking about 22 months ago. Her smoking use included cigarettes. She has never used smokeless tobacco. She reports current alcohol use of about 9.0 standard drinks of alcohol per week. She reports that she does not use drugs.    Objective: Vitals:   11/24/19 1127  BP: 120/76  Pulse: 63  Temp: Marland Kitchen)  97.3 F (36.3 C)  TempSrc: Temporal  SpO2: 99%  Weight: 125 lb 6.4 oz (56.9 kg)  Height:  5\' 2"  (1.575 m)    Body mass index is 22.94 kg/m.  Physical Exam Vitals reviewed.  Constitutional:      Appearance: Normal appearance. She is well-developed and normal weight.  HENT:     Head: Normocephalic and atraumatic.     Right Ear: External ear normal.     Left Ear: External ear normal.  Eyes:     Conjunctiva/sclera: Conjunctivae normal.     Pupils: Pupils are equal, round, and reactive to light.  Neck:     Thyroid: No thyromegaly.  Cardiovascular:     Rate and Rhythm: Normal rate and regular rhythm.     Heart sounds: Normal heart sounds. No murmur.  Pulmonary:     Effort: Pulmonary effort is normal.     Breath sounds: Normal breath sounds.  Abdominal:     General: Abdomen is flat. Bowel sounds are normal. There is no distension.     Palpations: Abdomen is soft.     Tenderness: There is no abdominal tenderness.  Musculoskeletal:     Cervical back: Normal range of motion and neck supple.  Lymphadenopathy:     Cervical: No cervical adenopathy.  Skin:    General: Skin is warm and dry.     Findings: No rash.  Neurological:     General: No focal deficit present.     Mental Status: She is alert and oriented to person, place, and time.     Cranial Nerves: No cranial nerve deficit.     Coordination: Coordination normal.     Deep Tendon Reflexes: Reflexes normal.  Psychiatric:        Mood and Affect: Mood normal.        Behavior: Behavior normal.     Comments: Denies any si/hi/ah/vh       Office Visit from 11/24/2019 in [facility]  PHQ-9 Total Score  3         Assessment/plan: 1. Acute on chronic pancreatitis (HCC) Resolved. Checking labs. Encouraged abstinence from alcohol and to reach out to programs that social work gave her.  - CBC with Differential/Platelet - Comprehensive metabolic panel  2. Benign essential HTN To goal. Continue lisinopril 40mg . I am decreasing her metoprolol down to 50mg  BID since her heart rate is around 60. She  has no symptoms and asked that she keep a log of her heart rate and bp at work. We will f/u on this in a month.   3. ETOH abuse Really believes she will abstain this time, but discussed she says that each time. Her mom is a one of the sole issues that drives her to be depressed and drink. We are going to start an SSRI to help with depression and again encouraged her to reach out to programs. continue MV/thiamine.   4. Depression, recurrent (HCC) phq9 score is mild and it was last time as well; however, this is the second time for her to speak suicidal ideations and discussed that this is not healthy. We are going to start her on low dose prozac. I've explained to her that drugs of the SSRI class can have side effects such as weight gain, sexual dysfunction, insomnia, headache, nausea. These medications are generally effective at alleviating symptoms of anxiety and/or depression. Let me know if significant side effects do occur. Any si/hi she is to call 911 or go to ED. Close  f/u in one month. She is also going to start counseling back.   5. Tachycardia Decreasing beta blocker, but asking her to keep log as well. Well controlled today.   All hospital notes/records/labs and imaging reviewed.   This visit occurred during the SARS-CoV-2 public health emergency.  Safety protocols were in place, including screening questions prior to the visit, additional usage of staff PPE, and extensive cleaning of exam room while observing appropriate contact time as indicated for disinfecting solutions.       Return in about 1 month (around 12/24/2019) for depression and blood pressure follow up .     Orland Mustard, MD [place] [facility]  11/24/2019

## 2019-12-08 ENCOUNTER — Telehealth: Payer: Self-pay | Admitting: Family Medicine

## 2019-12-08 NOTE — Telephone Encounter (Signed)
Yes she is safe to get covid vaccination with her medication. Only reason to wait for vaccine is if she has had covid. Recommended a 90 day waiting period. If any allergy to component of vaccine then NOT indicated, otherwise safe to get.  Orland Mustard, MD [place] [facility]

## 2019-12-08 NOTE — Telephone Encounter (Signed)
Spoke to pt to give message below. Pt expressed understanding.

## 2019-12-08 NOTE — Telephone Encounter (Signed)
Please Advise

## 2019-12-08 NOTE — Telephone Encounter (Signed)
Pt called asking for Dr. Jennette Kettle recommendation on the COVID vaccine. Pt wanted to know if it would still be safe for her to get it with all of the medication she is on. Please advise.

## 2019-12-21 ENCOUNTER — Telehealth: Payer: Self-pay | Admitting: Family Medicine

## 2019-12-21 MED ORDER — LISINOPRIL 40 MG PO TABS: 40.0000 mg | ORAL_TABLET | Freq: Every day | ORAL | 1 refills | Status: DC

## 2019-12-21 MED ORDER — FOLIC ACID 1 MG PO TABS: 1.0000 mg | ORAL_TABLET | Freq: Every day | ORAL | 3 refills | Status: DC

## 2019-12-21 NOTE — Addendum Note (Signed)
Addended by: Orland Mustard on: 12/21/2019 03:07 PM   Modules accepted: Orders

## 2019-12-21 NOTE — Telephone Encounter (Signed)
Lvm for pt to call the office back to give message below. 

## 2019-12-21 NOTE — Telephone Encounter (Signed)
I sent in her folic acid and her lisinopril. Lets see how she does off of the protonix. If she starts to have reflux or stomach pain, let us know but I would like to keep her off of this unless she has chronic reflux.  Thanks!  Dr. Artis Flock

## 2019-12-21 NOTE — Telephone Encounter (Signed)
Patient only has enough medication to last until tomorrow,

## 2019-12-21 NOTE — Telephone Encounter (Signed)
°  LAST APPOINTMENT DATE: 12/08/2019   NEXT APPOINTMENT DATE:@5 /10/2019  MEDICATION:pantoprazole (PROTONIX) 40 MG tablet/folic acid (FOLVITE) 1 MG tablet/lisinopril (ZESTRIL) 40 MG tablet/  PHARMACY:[facility] [address] - [place], [place] - [address]  **Let patient know to contact pharmacy at the end of the day to make sure medication is ready. **  ** Please notify patient to allow 48-72 hours to process**  **Encourage patient to contact the pharmacy for refills or they can request refills through [facility]**  CLINICAL FILLS OUT ALL BELOW:   LAST REFILL:  QTY:  REFILL DATE:    OTHER COMMENTS:    Okay for refill?  Please advise

## 2019-12-22 ENCOUNTER — Telehealth: Payer: Self-pay | Admitting: Family Medicine

## 2019-12-22 NOTE — Telephone Encounter (Signed)
Pt returned call from office. Requesting for CMA to return her call. She will be available up until 1pm tomorrow (Friday 12/23/2019). Please advise.

## 2019-12-22 NOTE — Telephone Encounter (Signed)
I spoke with pt to confirm medications sent to her pharmacy. And gave previous message to d/c Protonix from Dr Artis Flock to see how she does without it. Pt verbalized understanding.

## 2019-12-26 ENCOUNTER — Encounter: Payer: Self-pay | Admitting: Family Medicine

## 2019-12-26 ENCOUNTER — Telehealth: Payer: Self-pay | Admitting: Family Medicine

## 2019-12-26 ENCOUNTER — Ambulatory Visit (INDEPENDENT_AMBULATORY_CARE_PROVIDER_SITE_OTHER): Payer: Managed Care, Other (non HMO) | Admitting: Family Medicine

## 2019-12-26 ENCOUNTER — Other Ambulatory Visit: Payer: Self-pay

## 2019-12-26 VITALS — BP 98/70 | HR 71 | Temp 97.4°F | Ht 62.0 in | Wt 121.6 lb

## 2019-12-26 DIAGNOSIS — H029 Unspecified disorder of eyelid: Secondary | ICD-10-CM | POA: Diagnosis not present

## 2019-12-26 DIAGNOSIS — R000 Tachycardia, unspecified: Secondary | ICD-10-CM | POA: Diagnosis not present

## 2019-12-26 DIAGNOSIS — F339 Major depressive disorder, recurrent, unspecified: Secondary | ICD-10-CM | POA: Insufficient documentation

## 2019-12-26 DIAGNOSIS — I10 Essential (primary) hypertension: Secondary | ICD-10-CM | POA: Diagnosis not present

## 2019-12-26 MED ORDER — LISINOPRIL 20 MG PO TABS: 20.0000 mg | ORAL_TABLET | Freq: Every day | ORAL | 1 refills | Status: DC

## 2019-12-26 MED ORDER — FLUOXETINE HCL 20 MG PO CAPS: 20.0000 mg | ORAL_CAPSULE | Freq: Every day | ORAL | 1 refills | Status: DC

## 2019-12-26 MED ORDER — METOPROLOL TARTRATE 25 MG PO TABS: 25.0000 mg | ORAL_TABLET | Freq: Two times a day (BID) | ORAL | 1 refills | Status: DC

## 2019-12-26 MED ORDER — FLUOXETINE HCL 20 MG PO TABS: 20.0000 mg | ORAL_TABLET | Freq: Every day | ORAL | 1 refills | Status: DC

## 2019-12-26 NOTE — Telephone Encounter (Signed)
Called and spoke with pharmacy. Advised them to refill 20mg  for pt, she is no longer on 20mg . She says that she told her to cut the pill in half. I asked her if she received the rx refill for 20 mg tab, because it was sent over today. Pharmacy will call the pt's insurance company to get that authorized.

## 2019-12-26 NOTE — Telephone Encounter (Signed)
Patient calling stating that she cant cut the lisinopril 20 mg in half. Pharmacy didn't change script. Please Advise. jk

## 2019-12-26 NOTE — Progress Notes (Signed)
Patient: Donna Short MRN: [id number] DOB: 1991/02/02 PCP: Orma Flaming, MD     Subjective:  Chief Complaint  Patient presents with  . Depression    1 mth f/u  . Hypertension    "          "    HPI: The patient is a 29 y.o. female who presents today for Depression and Hypertension.  Hypertension: Here for follow up of hypertension.  Currently on lisinopril 40mg  daily and metoprolol 50mg  BID.  Takes medication as prescribed and denies any side effects. Exercise includes none. Weight has been stable. Denies any chest pain, headaches, shortness of breath, vision changes, swelling in lower extremities. She has been very tired. No light headed or dizziness.   I decreased her metoprolol down to 50mg  BID at her last visit. She states she is still really tired and fatigued.   Depression I started her on prozac at last visit. She states she is able to control her anger better and can tell a difference. She is not reacting as much in anger. She has no si/hi. She is tolerating well.   Review of Systems  Constitutional: Positive for fatigue.  HENT: Negative for congestion and sore throat.   Respiratory: Negative for shortness of breath and wheezing.   Cardiovascular: Negative for chest pain and palpitations.  Neurological: Negative for dizziness, light-headedness and headaches.    Allergies Patient is allergic to claritin [loratadine]; peanut-containing drug products; benadryl [diphenhydramine hcl (sleep)]; latex; penicillins; and shrimp [shellfish allergy].  Past Medical History Patient  has a past medical history of Asthma, History of chicken pox, Hypertension, Migraines, Pancreatitis (01/2018), and Seizures ([place]).  Surgical History Patient  has a past surgical history that includes Wisdom tooth extraction.  Family History Pateint's family history includes Alcohol abuse in her father; Arthritis in her father; Asthma in her mother; Breast cancer in her mother; Diabetes in her  father; Drug abuse in her mother; Hypertension in her mother.  Social History Patient  reports that she quit smoking about 23 months ago. Her smoking use included cigarettes. She has never used smokeless tobacco. She reports current alcohol use of about 9.0 standard drinks of alcohol per week. She reports that she does not use drugs.    Objective: Vitals:   12/26/19 0953 12/26/19 1029  BP: 100/60 98/70  Pulse: 71   Temp: (!) 97.4 F (36.3 C)   TempSrc: Temporal   SpO2: 98%   Weight: 121 lb 9.6 oz (55.2 kg)   Height: 5\' 2"  (1.575 m)     Body mass index is 22.24 kg/m.  Physical Exam Vitals reviewed.  Constitutional:      Appearance: Normal appearance. She is normal weight.  HENT:     Head: Normocephalic and atraumatic.  Cardiovascular:     Rate and Rhythm: Normal rate and regular rhythm.     Heart sounds: Normal heart sounds.  Pulmonary:     Effort: Pulmonary effort is normal.     Breath sounds: Normal breath sounds.  Abdominal:     General: Abdomen is flat. Bowel sounds are normal.     Palpations: Abdomen is soft.  Musculoskeletal:     Cervical back: Normal range of motion and neck supple.  Skin:    Comments: Lesion on right eyelid. erythematous and raised.   Neurological:     General: No focal deficit present.     Mental Status: She is alert and oriented to person, place, and time.  Psychiatric:  Mood and Affect: Mood normal.        Behavior: Behavior normal.     Comments: No si/hi/ah/vh            Office Visit from 12/26/2019 in [facility]  PHQ-9 Total Score  1      Assessment/plan: 1. Benign essential HTN Low readings with fatigue. Will decrease her lisinopril down to 20mg /day and decreasing her metoprolol down to 25mg  BID. Would like her to keep a log, but I don't think she has access to this. Wrote all instructions down for her. F/u with me in 3 months time for blood pressure recheck.   2. Depression, recurrent (HCC) PHQ9  significantly improved and she is doing well on prozac. Glad she is amenable to this. Helping with her job as well. Continue current dosage at 20mg . F/u in 6 months or as needed.   3. Tachycardia Heart rate still well controlled. Since fatigue with low BP, will decrease her metoprolol down to 25mg  BID. F/u in 6 weeks.   4. Lesion of right upper eyelid  - Ambulatory referral to Ophthalmology  This visit occurred during the SARS-CoV-2 public health emergency.  Safety protocols were in place, including screening questions prior to the visit, additional usage of staff PPE, and extensive cleaning of exam room while observing appropriate contact time as indicated for disinfecting solutions.     Return in about 3 months (around 03/27/2020) for blood pressure .   , MD [place] [facility]   12/26/2019

## 2019-12-26 NOTE — Patient Instructions (Addendum)
COVID-19 Vaccine Information can be found at: [email or web address] For questions related to vaccine distribution or appointments, please email [email or web address] or call [phone].    BLOOD PRESSURE 1) decreasing your lisinopril down to 20mg /day. I sent in new dosage for you!  2) metoprolol: decreased down to 25mg  twice a day. Sent in new dosage for you!   DEPRESSION 1) continue on same dose. I think your doing well! Good job!   EYELID LESION 1) referral to eye surgeon. They will call you on this!   See you in 3 months time.  You're doing great. Proud of you!  Dr. 

## 2019-12-26 NOTE — Telephone Encounter (Signed)
I sent in 20mg  of lisinopril today, please check with pharmacy and let patient know since she is saying they didn't do the 20mg  dosage.  Thanks,  Dr. 

## 2020-02-24 ENCOUNTER — Other Ambulatory Visit: Payer: Self-pay | Admitting: Neurology

## 2020-03-14 ENCOUNTER — Telehealth: Payer: Self-pay

## 2020-03-14 MED ORDER — LEVETIRACETAM 500 MG PO TABS: 500.0000 mg | ORAL_TABLET | Freq: Two times a day (BID) | ORAL | 1 refills | Status: DC

## 2020-03-14 NOTE — Telephone Encounter (Signed)
..   LAST APPOINTMENT DATE: 12/26/2019   NEXT APPOINTMENT DATE:@Visit  date not found  MEDICATION:levETIRAcetam (KEPPRA) 500 MG tablet    PH[facility]ACY: [facility] [address] [place] - [address]  **Let patient know to contact pharmacy at the end of the day to make sure medication is ready. **  ** Please notify patient to allow 48-72 hours to process**  **Encourage patient to contact the pharmacy for refills or they can request refills through [facility]**  CLINICAL FILLS OUT ALL BELOW:   LAST REFILL:  QTY:  REFILL DATE:    OTHER COMMENTS:    Okay for refill?  Please advise

## 2020-03-14 NOTE — Telephone Encounter (Signed)
Refilled.  Bella Brummet, MD [place] [facility]   

## 2020-03-26 NOTE — Telephone Encounter (Signed)
Pt called stating she just picked up her medication. Pt states she received 2 bottles and is confused why she received 2. Please advise.

## 2020-03-26 NOTE — Telephone Encounter (Signed)
Spoke with patient and notified her to reach out to her pharmacy on clarification of this.

## 2020-05-10 ENCOUNTER — Other Ambulatory Visit: Payer: Self-pay

## 2020-05-10 ENCOUNTER — Encounter (HOSPITAL_COMMUNITY): Payer: Self-pay | Admitting: Emergency Medicine

## 2020-05-10 ENCOUNTER — Emergency Department (HOSPITAL_COMMUNITY): Payer: Managed Care, Other (non HMO)

## 2020-05-10 ENCOUNTER — Inpatient Hospital Stay (HOSPITAL_COMMUNITY)
Admission: EM | Admit: 2020-05-10 | Discharge: 2020-05-31 | DRG: 004 | Disposition: A | Discharge status: Other Acute Inpatient Hospital | Payer: Managed Care, Other (non HMO) | Attending: Pulmonary Disease | Admitting: Pulmonary Disease

## 2020-05-10 DIAGNOSIS — E538 Deficiency of other specified B group vitamins: Secondary | ICD-10-CM | POA: Diagnosis present

## 2020-05-10 DIAGNOSIS — E876 Hypokalemia: Secondary | ICD-10-CM | POA: Diagnosis not present

## 2020-05-10 DIAGNOSIS — Z91013 Allergy to seafood: Secondary | ICD-10-CM

## 2020-05-10 DIAGNOSIS — G928 Other toxic encephalopathy: Secondary | ICD-10-CM | POA: Diagnosis present

## 2020-05-10 DIAGNOSIS — E8809 Other disorders of plasma-protein metabolism, not elsewhere classified: Secondary | ICD-10-CM | POA: Diagnosis not present

## 2020-05-10 DIAGNOSIS — R451 Restlessness and agitation: Secondary | ICD-10-CM | POA: Diagnosis present

## 2020-05-10 DIAGNOSIS — R059 Cough, unspecified: Secondary | ICD-10-CM | POA: Diagnosis present

## 2020-05-10 DIAGNOSIS — I10 Essential (primary) hypertension: Secondary | ICD-10-CM

## 2020-05-10 DIAGNOSIS — R410 Disorientation, unspecified: Secondary | ICD-10-CM | POA: Diagnosis not present

## 2020-05-10 DIAGNOSIS — T68XXXA Hypothermia, initial encounter: Secondary | ICD-10-CM | POA: Diagnosis not present

## 2020-05-10 DIAGNOSIS — D649 Anemia, unspecified: Secondary | ICD-10-CM | POA: Diagnosis present

## 2020-05-10 DIAGNOSIS — R569 Unspecified convulsions: Secondary | ICD-10-CM

## 2020-05-10 DIAGNOSIS — N179 Acute kidney failure, unspecified: Secondary | ICD-10-CM | POA: Diagnosis present

## 2020-05-10 DIAGNOSIS — T41295A Adverse effect of other general anesthetics, initial encounter: Secondary | ICD-10-CM | POA: Diagnosis not present

## 2020-05-10 DIAGNOSIS — E875 Hyperkalemia: Secondary | ICD-10-CM | POA: Diagnosis present

## 2020-05-10 DIAGNOSIS — R000 Tachycardia, unspecified: Secondary | ICD-10-CM | POA: Diagnosis present

## 2020-05-10 DIAGNOSIS — G40901 Epilepsy, unspecified, not intractable, with status epilepticus: Secondary | ICD-10-CM

## 2020-05-10 DIAGNOSIS — E872 Acidosis: Secondary | ICD-10-CM | POA: Diagnosis not present

## 2020-05-10 DIAGNOSIS — Z91040 Latex allergy status: Secondary | ICD-10-CM

## 2020-05-10 DIAGNOSIS — K859 Acute pancreatitis without necrosis or infection, unspecified: Secondary | ICD-10-CM | POA: Diagnosis present

## 2020-05-10 DIAGNOSIS — Z880 Allergy status to penicillin: Secondary | ICD-10-CM

## 2020-05-10 DIAGNOSIS — E781 Pure hyperglyceridemia: Secondary | ICD-10-CM | POA: Diagnosis not present

## 2020-05-10 DIAGNOSIS — I161 Hypertensive emergency: Secondary | ICD-10-CM | POA: Diagnosis not present

## 2020-05-10 DIAGNOSIS — G9340 Encephalopathy, unspecified: Secondary | ICD-10-CM

## 2020-05-10 DIAGNOSIS — Z930 Tracheostomy status: Secondary | ICD-10-CM

## 2020-05-10 DIAGNOSIS — Z20822 Contact with and (suspected) exposure to covid-19: Secondary | ICD-10-CM | POA: Diagnosis present

## 2020-05-10 DIAGNOSIS — Z79899 Other long term (current) drug therapy: Secondary | ICD-10-CM

## 2020-05-10 DIAGNOSIS — R112 Nausea with vomiting, unspecified: Secondary | ICD-10-CM | POA: Diagnosis present

## 2020-05-10 DIAGNOSIS — E778 Other disorders of glycoprotein metabolism: Secondary | ICD-10-CM | POA: Diagnosis not present

## 2020-05-10 DIAGNOSIS — Z781 Physical restraint status: Secondary | ICD-10-CM

## 2020-05-10 DIAGNOSIS — D696 Thrombocytopenia, unspecified: Secondary | ICD-10-CM | POA: Diagnosis not present

## 2020-05-10 DIAGNOSIS — R140 Abdominal distension (gaseous): Secondary | ICD-10-CM

## 2020-05-10 DIAGNOSIS — Z813 Family history of other psychoactive substance abuse and dependence: Secondary | ICD-10-CM

## 2020-05-10 DIAGNOSIS — Z01818 Encounter for other preprocedural examination: Secondary | ICD-10-CM

## 2020-05-10 DIAGNOSIS — G43909 Migraine, unspecified, not intractable, without status migrainosus: Secondary | ICD-10-CM | POA: Diagnosis present

## 2020-05-10 DIAGNOSIS — Z833 Family history of diabetes mellitus: Secondary | ICD-10-CM

## 2020-05-10 DIAGNOSIS — I6783 Posterior reversible encephalopathy syndrome: Principal | ICD-10-CM | POA: Diagnosis present

## 2020-05-10 DIAGNOSIS — Z9911 Dependence on respirator [ventilator] status: Secondary | ICD-10-CM

## 2020-05-10 DIAGNOSIS — Z87891 Personal history of nicotine dependence: Secondary | ICD-10-CM

## 2020-05-10 DIAGNOSIS — J9811 Atelectasis: Secondary | ICD-10-CM | POA: Diagnosis not present

## 2020-05-10 DIAGNOSIS — G9341 Metabolic encephalopathy: Secondary | ICD-10-CM | POA: Diagnosis present

## 2020-05-10 DIAGNOSIS — R509 Fever, unspecified: Secondary | ICD-10-CM | POA: Diagnosis not present

## 2020-05-10 DIAGNOSIS — Z91010 Allergy to peanuts: Secondary | ICD-10-CM

## 2020-05-10 DIAGNOSIS — F09 Unspecified mental disorder due to known physiological condition: Secondary | ICD-10-CM | POA: Diagnosis not present

## 2020-05-10 DIAGNOSIS — R768 Other specified abnormal immunological findings in serum: Secondary | ICD-10-CM | POA: Diagnosis not present

## 2020-05-10 DIAGNOSIS — F329 Major depressive disorder, single episode, unspecified: Secondary | ICD-10-CM | POA: Diagnosis present

## 2020-05-10 DIAGNOSIS — J9601 Acute respiratory failure with hypoxia: Secondary | ICD-10-CM | POA: Diagnosis not present

## 2020-05-10 DIAGNOSIS — Z811 Family history of alcohol abuse and dependence: Secondary | ICD-10-CM

## 2020-05-10 DIAGNOSIS — T17890A Other foreign object in other parts of respiratory tract causing asphyxiation, initial encounter: Secondary | ICD-10-CM | POA: Diagnosis not present

## 2020-05-10 DIAGNOSIS — E162 Hypoglycemia, unspecified: Secondary | ICD-10-CM | POA: Diagnosis not present

## 2020-05-10 DIAGNOSIS — J4520 Mild intermittent asthma, uncomplicated: Secondary | ICD-10-CM | POA: Diagnosis present

## 2020-05-10 DIAGNOSIS — K860 Alcohol-induced chronic pancreatitis: Secondary | ICD-10-CM | POA: Diagnosis present

## 2020-05-10 DIAGNOSIS — Z888 Allergy status to other drugs, medicaments and biological substances status: Secondary | ICD-10-CM

## 2020-05-10 DIAGNOSIS — Z978 Presence of other specified devices: Secondary | ICD-10-CM

## 2020-05-10 DIAGNOSIS — I959 Hypotension, unspecified: Secondary | ICD-10-CM | POA: Diagnosis not present

## 2020-05-10 DIAGNOSIS — Z8261 Family history of arthritis: Secondary | ICD-10-CM

## 2020-05-10 DIAGNOSIS — J384 Edema of larynx: Secondary | ICD-10-CM | POA: Diagnosis not present

## 2020-05-10 DIAGNOSIS — R739 Hyperglycemia, unspecified: Secondary | ICD-10-CM | POA: Diagnosis not present

## 2020-05-10 DIAGNOSIS — Z803 Family history of malignant neoplasm of breast: Secondary | ICD-10-CM

## 2020-05-10 DIAGNOSIS — R6510 Systemic inflammatory response syndrome (SIRS) of non-infectious origin without acute organ dysfunction: Secondary | ICD-10-CM

## 2020-05-10 DIAGNOSIS — K852 Alcohol induced acute pancreatitis without necrosis or infection: Secondary | ICD-10-CM | POA: Diagnosis present

## 2020-05-10 DIAGNOSIS — J9690 Respiratory failure, unspecified, unspecified whether with hypoxia or hypercapnia: Secondary | ICD-10-CM

## 2020-05-10 DIAGNOSIS — G40A01 Absence epileptic syndrome, not intractable, with status epilepticus: Secondary | ICD-10-CM | POA: Diagnosis present

## 2020-05-10 DIAGNOSIS — F10131 Alcohol abuse with withdrawal delirium: Secondary | ICD-10-CM | POA: Diagnosis present

## 2020-05-10 DIAGNOSIS — B958 Unspecified staphylococcus as the cause of diseases classified elsewhere: Secondary | ICD-10-CM | POA: Diagnosis not present

## 2020-05-10 DIAGNOSIS — R4701 Aphasia: Secondary | ICD-10-CM | POA: Diagnosis not present

## 2020-05-10 DIAGNOSIS — Z8249 Family history of ischemic heart disease and other diseases of the circulatory system: Secondary | ICD-10-CM

## 2020-05-10 DIAGNOSIS — F1010 Alcohol abuse, uncomplicated: Secondary | ICD-10-CM | POA: Diagnosis present

## 2020-05-10 DIAGNOSIS — Z825 Family history of asthma and other chronic lower respiratory diseases: Secondary | ICD-10-CM

## 2020-05-10 HISTORY — DX: Depression, unspecified: F32.A

## 2020-05-10 LAB — COMPREHENSIVE METABOLIC PANEL
ALT: 61 U/L — ABNORMAL HIGH (ref 0–44)
AST: 126 U/L — ABNORMAL HIGH (ref 15–41)
Albumin: 4.8 g/dL (ref 3.5–5.0)
Alkaline Phosphatase: 57 U/L (ref 38–126)
Anion gap: 33 — ABNORMAL HIGH (ref 5–15)
BUN: 25 mg/dL — ABNORMAL HIGH (ref 6–20)
CO2: 9 mmol/L — ABNORMAL LOW (ref 22–32)
Calcium: 9.6 mg/dL (ref 8.9–10.3)
Chloride: 96 mmol/L — ABNORMAL LOW (ref 98–111)
Creatinine, Ser: 1.39 mg/dL — ABNORMAL HIGH (ref 0.44–1.00)
GFR calc Af Amer: 58 mL/min — ABNORMAL LOW (ref >60)
GFR calc non Af Amer: 50 mL/min — ABNORMAL LOW (ref >60)
Glucose, Bld: 175 mg/dL — ABNORMAL HIGH (ref 70–99)
Potassium: 5.6 mmol/L — ABNORMAL HIGH (ref 3.5–5.1)
Sodium: 138 mmol/L (ref 135–145)
Total Bilirubin: 0.9 mg/dL (ref 0.3–1.2)
Total Protein: 9.3 g/dL — ABNORMAL HIGH (ref 6.5–8.1)

## 2020-05-10 LAB — SALICYLATE LEVEL: Salicylate Lvl: <7 mg/dL — ABNORMAL LOW (ref 7.0–30.0)

## 2020-05-10 LAB — CBC
HCT: 44.4 % (ref 36.0–46.0)
Hemoglobin: 13.9 g/dL (ref 12.0–15.0)
MCH: 35.1 pg — ABNORMAL HIGH (ref 26.0–34.0)
MCHC: 31.3 g/dL (ref 30.0–36.0)
MCV: 112.1 fL — ABNORMAL HIGH (ref 80.0–100.0)
Platelets: 200 10*3/uL (ref 150–400)
RBC: 3.96 MIL/uL (ref 3.87–5.11)
RDW: 11.4 % — ABNORMAL LOW (ref 11.5–15.5)
WBC: 13.1 10*3/uL — ABNORMAL HIGH (ref 4.0–10.5)
nRBC: 0 % (ref 0.0–0.2)

## 2020-05-10 LAB — I-STAT BETA HCG BLOOD, ED (MC, WL, AP ONLY): I-stat hCG, quantitative: <5 m[IU]/mL (ref <5)

## 2020-05-10 LAB — ETHANOL: Alcohol, Ethyl (B): <10 mg/dL (ref <10)

## 2020-05-10 LAB — LACTIC ACID, PLASMA: Lactic Acid, Venous: 2.6 mmol/L (ref 0.5–1.9)

## 2020-05-10 LAB — SARS CORONAVIRUS 2 BY RT PCR (HOSPITAL ORDER, PERFORMED IN ~~LOC~~ HOSPITAL LAB): SARS Coronavirus 2: NEGATIVE

## 2020-05-10 LAB — LIPASE, BLOOD: Lipase: 101 U/L — ABNORMAL HIGH (ref 11–51)

## 2020-05-10 MED ORDER — LORAZEPAM 1 MG PO TABS
0.0000 mg | ORAL_TABLET | Freq: Four times a day (QID) | ORAL | Status: DC
  Filled 2020-05-10: qty 1

## 2020-05-10 MED ORDER — ONDANSETRON 4 MG PO TBDP
4.0000 mg | ORAL_TABLET | Freq: Once | ORAL | Status: AC | PRN
  Administered 2020-05-10: 4 mg via ORAL
  Filled 2020-05-10: qty 1

## 2020-05-10 MED ORDER — ONDANSETRON HCL 4 MG/2ML IJ SOLN
4.0000 mg | Freq: Once | INTRAMUSCULAR | Status: AC
  Administered 2020-05-10: 4 mg via INTRAVENOUS
  Filled 2020-05-10: qty 2

## 2020-05-10 MED ORDER — LORAZEPAM 1 MG PO TABS: 1.0000 mg | ORAL_TABLET | Freq: Once | ORAL | Status: DC

## 2020-05-10 MED ORDER — THIAMINE HCL 100 MG/ML IJ SOLN
100.0000 mg | Freq: Every day | INTRAMUSCULAR | Status: DC
  Administered 2020-05-10: 100 mg via INTRAVENOUS
  Filled 2020-05-10 (×2): qty 2

## 2020-05-10 MED ORDER — SODIUM CHLORIDE 0.9 % IV BOLUS
1000.0000 mL | Freq: Once | INTRAVENOUS | Status: AC
  Administered 2020-05-10: 1000 mL via INTRAVENOUS

## 2020-05-10 MED ORDER — THIAMINE HCL 100 MG PO TABS
100.0000 mg | ORAL_TABLET | Freq: Every day | ORAL | Status: DC
  Filled 2020-05-10: qty 1

## 2020-05-10 MED ORDER — LORAZEPAM 2 MG/ML IJ SOLN: 0.0000 mg | Freq: Two times a day (BID) | INTRAMUSCULAR | Status: DC

## 2020-05-10 MED ORDER — LORAZEPAM 2 MG/ML IJ SOLN: 2.0000 mg | Freq: Once | INTRAMUSCULAR | Status: DC

## 2020-05-10 MED ORDER — LORAZEPAM 1 MG PO TABS
0.0000 mg | ORAL_TABLET | Freq: Two times a day (BID) | ORAL | Status: DC
  Administered 2020-05-10: 1 mg via ORAL

## 2020-05-10 MED ORDER — LORAZEPAM 2 MG/ML IJ SOLN
0.0000 mg | Freq: Four times a day (QID) | INTRAMUSCULAR | Status: DC
  Administered 2020-05-10: 2 mg via INTRAVENOUS
  Administered 2020-05-11: 1 mg via INTRAVENOUS
  Filled 2020-05-10 (×2): qty 1

## 2020-05-10 MED ORDER — IOHEXOL 300 MG/ML  SOLN
100.0000 mL | Freq: Once | INTRAMUSCULAR | Status: AC | PRN
  Administered 2020-05-10: 100 mL via INTRAVENOUS

## 2020-05-10 NOTE — ED Provider Notes (Signed)
[facility] EMERGENCY DEPARTMENT Provider Note   CSN: [id number] Arrival date & time: 05/10/20  1110     History No chief complaint on file.   Donna Short is a 29 y.o. female.  HPI   Patient presents with concern of abdominal pain, nausea, vomiting. Illness began yesterday, and since that time she has had multiple episodes of vomiting, has pain about the epigastrium, that is waxing, waning in severity, but is always present to some degree. Initially she had mild cough, but denies any fever, chills, dyspnea, chest pain. Patient initially denies medical problems, but eventually acknowledges a history of depression, alcohol use. Patient notes that she has substantial stressors at home. Today, with inability to stop vomiting, decreased her pain she presents for evaluation.  Past Medical History:  Diagnosis Date  . Asthma   . History of chicken pox   . Hypertension   . Migraines   . Pancreatitis 01/2018  . Seizures [facility])     Patient Active Problem List   Diagnosis Date Noted  . Depression, recurrent (HCC) 12/26/2019  . Acute pancreatitis 11/17/2019  . Acute on chronic pancreatitis (HCC) 02/24/2019  . Uncomplicated asthma   . Benign essential HTN   . Tachycardia   . ETOH abuse   . Seizures (HCC) 12/19/2015    Past Surgical History:  Procedure Laterality Date  . WISDOM TOOTH EXTRACTION       OB History   No obstetric history on file.     Family History  Problem Relation Age of Onset  . Diabetes Father   . Arthritis Father   . Alcohol abuse Father   . Breast cancer Mother   . Asthma Mother   . Drug abuse Mother   . Hypertension Mother     Social History   Tobacco Use  . Smoking status: Former Smoker    Types: Cigarettes    Quit date: 01/21/2018    Years since quitting: 2.3  . Smokeless tobacco: Never Used  Vaping Use  . Vaping Use: Never used  Substance Use Topics  . Alcohol use: Yes    Alcohol/week: 9.0 standard drinks     Types: 9 Shots of liquor per week    Comment: 02/2019 "  2 shots every 3 or 4 days "  . Drug use: No    Home Medications Prior to Admission medications   Medication Sig Start Date End Date Taking? Authorizing Provider  acetaminophen (TYLENOL) 500 MG tablet Take 1,000 mg by mouth every 6 (six) hours as needed for mild pain.    [provider]  albuterol (VENTOLIN HFA) 108 (90 Base) MCG/ACT inhaler Inhale 1-2 puffs into the lungs every 6 (six) hours as needed for wheezing or shortness of breath. 02/08/19   Wieters, Hallie C, PA-C  FLUoxetine (PROZAC) 20 MG capsule Take 1 capsule (20 mg total) by mouth daily. 12/26/19   Orland Mustard, MD  folic acid (FOLVITE) 1 MG tablet Take 1 tablet (1 mg total) by mouth daily. 12/21/19   Orland Mustard, MD  ibuprofen (ADVIL) 600 MG tablet Take 1 tablet (600 mg total) by mouth every 8 (eight) hours as needed. Patient taking differently: Take 600 mg by mouth every 8 (eight) hours as needed for moderate pain.  08/25/19   Orland Mustard, MD  levETIRAcetam (KEPPRA) 500 MG tablet Take 1 tablet (500 mg total) by mouth 2 (two) times daily. 03/14/20   Orland Mustard, MD  lisinopril (ZESTRIL) 20 MG tablet Take 1 tablet (20  mg total) by mouth daily. 12/26/19   Orland Mustard, MD  metoprolol tartrate (LOPRESSOR) 25 MG tablet Take 1 tablet (25 mg total) by mouth 2 (two) times daily. 12/26/19   Orland Mustard, MD    Allergies    Claritin [loratadine], Peanut-containing drug products, Benadryl [diphenhydramine hcl (sleep)], Latex, Penicillins, and Shrimp [shellfish allergy]  Review of Systems   Review of Systems  Constitutional:       Per HPI, otherwise negative  HENT:       Per HPI, otherwise negative  Respiratory:       Per HPI, otherwise negative  Cardiovascular:       Per HPI, otherwise negative  Gastrointestinal: Positive for abdominal pain, nausea and vomiting.  Endocrine:       Negative aside from HPI  Genitourinary:       Neg aside from HPI     Musculoskeletal:       Per HPI, otherwise negative  Skin: Negative.   Neurological: Negative for syncope.  Psychiatric/Behavioral: The patient is nervous/anxious.     Physical Exam Updated Vital Signs BP (!) 165/113   Pulse (!) 129   Temp 98.6 F (37 C) (Oral)   Resp 16   Ht 5\' 2"  (1.575 m)   Wt 58.1 kg   SpO2 100%   BMI 23.41 kg/m   Physical Exam Vitals and nursing note reviewed.  Constitutional:      General: She is not in acute distress.    Appearance: She is well-developed.  HENT:     Head: Normocephalic and atraumatic.  Eyes:     Conjunctiva/sclera: Conjunctivae normal.  Cardiovascular:     Rate and Rhythm: Regular rhythm. Tachycardia present.  Pulmonary:     Effort: Pulmonary effort is normal. No respiratory distress.     Breath sounds: Normal breath sounds. No stridor.  Abdominal:     General: There is no distension.     Comments: Minimal abdominal tenderness, about the upper abdomen, no guarding, no rebound  Skin:    General: Skin is warm and dry.  Neurological:     Mental Status: She is alert and oriented to person, place, and time.     Cranial Nerves: No cranial nerve deficit.  Psychiatric:        Mood and Affect: Mood is anxious.      ED Results / Procedures / Treatments   Labs (all labs ordered are listed, but only abnormal results are displayed) Labs Reviewed  LIPASE, BLOOD - Abnormal; Notable for the following components:      Result Value   Lipase 101 (*)    All other components within normal limits  COMPREHENSIVE METABOLIC PANEL - Abnormal; Notable for the following components:   Potassium 5.6 (*)    Chloride 96 (*)    CO2 9 (*)    Glucose, Bld 175 (*)    BUN 25 (*)    Creatinine, Ser 1.39 (*)    Total Protein 9.3 (*)    AST 126 (*)    ALT 61 (*)    GFR calc non Af Amer 50 (*)    GFR calc Af Amer 58 (*)    Anion gap 33 (*)    All other components within normal limits  CBC - Abnormal; Notable for the following components:   WBC  13.1 (*)    MCV 112.1 (*)    MCH 35.1 (*)    RDW 11.4 (*)    All other components within normal limits  LACTIC  ACID, PLASMA - Abnormal; Notable for the following components:   Lactic Acid, Venous 2.6 (*)    All other components within normal limits  SALICYLATE LEVEL - Abnormal; Notable for the following components:   Salicylate Lvl <7.0 (*)    All other components within normal limits  SARS CORONAVIRUS 2 BY RT PCR (HOSPITAL ORDER, PERFORMED IN [facility])  ETHANOL  URINALYSIS, ROUTINE W REFLEX MICROSCOPIC  I-STAT BETA HCG BLOOD, ED ([facility], [facility], [facility] ONLY)     Radiology DG Chest 2 View  Result Date: 05/10/2020 CLINICAL DATA:  Cough EXAM: CHEST - 2 VIEW COMPARISON:  January 27, 2018 FINDINGS: The cardiomediastinal silhouette is unchanged in contour. No pleural effusion. No pneumothorax. No acute pleuroparenchymal abnormality. Visualized abdomen is unremarkable. No acute osseous abnormality. Dextrocurvature of the thoracic spine. IMPRESSION: No acute cardiopulmonary abnormality. Electronically Signed   By: Meda Klinefelter MD   On: 05/10/2020 12:20    Procedures Procedures (including critical care time)  Medications Ordered in ED Medications  LORazepam (ATIVAN) tablet 1 mg (has no administration in time range)  LORazepam (ATIVAN) injection 0-4 mg (2 mg Intravenous Given 05/10/20 2225)    Or  LORazepam (ATIVAN) tablet 0-4 mg ( Oral See Alternative 05/10/20 2225)  LORazepam (ATIVAN) injection 0-4 mg ( Intravenous See Alternative 05/10/20 1201)    Or  LORazepam (ATIVAN) tablet 0-4 mg (1 mg Oral Given 05/10/20 1201)  thiamine tablet 100 mg ( Oral See Alternative 05/10/20 2159)    Or  thiamine (B-1) injection 100 mg (100 mg Intravenous Given 05/10/20 2159)  LORazepam (ATIVAN) injection 2 mg (has no administration in time range)  ondansetron (ZOFRAN-ODT) disintegrating tablet 4 mg (4 mg Oral Given 05/10/20 1202)  sodium chloride 0.9 % bolus 1,000 mL (1,000 mLs Intravenous New Bag/Given  05/10/20 2159)  ondansetron (ZOFRAN) injection 4 mg (4 mg Intravenous Given 05/10/20 2207)  sodium chloride 0.9 % bolus 1,000 mL (1,000 mLs Intravenous New Bag/Given 05/10/20 2227)    ED Course  I have reviewed the triage vital signs and the nursing notes.  Pertinent labs & imaging results that were available during my care of the patient were reviewed by me and considered in my medical decision making (see chart for details).    MDM Rules/Calculators/A&P                           W initial lactic acidosis, tachycardia, infection vs. Withdrawal vs. Sepsis all considered.   Update: Repeat exam after initial fluid resuscitation the patient continues to be tachycardic, though she has not had additional vomiting.  Remaining labs notable for hyperkalemia, worsening renal function, slight elevation in liver enzymes, all consistent with dehydration/pancreatitis / SIRS (not overtly infectious) On repeat evaluation, she now acknowledges that she has been drinking substantially, recently with her life stressors. With consideration of withdrawal in addition to labs suggesting pancreatitis, the patient will continue received fluid resuscitation. CIWA score is twenty.  Patient has received 4 mg of Ativan thus far.   10:46 PM I discussed the patient's case with our internal medicine colleagues.  Line with concern, as above for pancreatitis, possible alcohol withdrawal, patient will require admission. Final Clinical Impression(s) / ED Diagnoses Final diagnoses:  Alcohol-induced acute pancreatitis, unspecified complication status  Hyperkalemia  SIRS (systemic inflammatory response syndrome) (HCC)   MDM Number of Diagnoses or Management Options Alcohol-induced acute pancreatitis, unspecified complication status: new, needed workup Hyperkalemia: new, needed workup SIRS (systemic inflammatory response syndrome) (  HCC): new, needed workup   Amount and/or Complexity of Data Reviewed Clinical lab  tests: reviewed Tests in the radiology section of CPT: reviewed Tests in the medicine section of CPT: reviewed Decide to obtain previous medical records or to obtain history from someone other than the patient: yes Review and summarize past medical records: yes Discuss the patient with other providers: yes Independent visualization of images, tracings, or specimens: yes  Risk of Complications, Morbidity, and/or Mortality Presenting problems: high Diagnostic procedures: high Management options: high  Critical Care Total time providing critical care: 30-74 minutes (35)  Patient Progress Patient progress: stable    Gerhard MunchLockwood, Makenzye Troutman, MD 05/10/20 2339

## 2020-05-10 NOTE — ED Notes (Signed)
Lactic 2.6 

## 2020-05-10 NOTE — ED Triage Notes (Signed)
Emergency Medicine Provider Triage Evaluation Note  Donna Short , a 29 y.o. female  was evaluated in triage.  Pt complains of cough and  Review of Systems  Positive: Coughing and posttussive vomiting Negative: Abdominal pain  Physical Exam  BP (!) 156/103   Pulse (!) 130   Temp 99 F (37.2 C) (Oral)   Resp 18   Ht 5\' 2"  (1.575 m)   Wt 58.1 kg   SpO2 100%   BMI 23.41 kg/m  Gen:   Awake, no distress   HEENT:  Atraumatic  Resp:  Normal effort, episodes of coughing followed by gagging and vomiting Cardiac:  Normal rate tachycardic Abd:   Nondistended, nontender  MSK:   Moves extremities without difficulty  Neuro:  Speech clear   Medical Decision Making  Medically screening exam initiated at 11:47 AM.  Appropriate orders placed.  Donna Short was informed that the remainder of the evaluation will be completed by another provider, this initial triage assessment does not replace that evaluation, and the importance of remaining in the ED until their evaluation is complete.  Clinical Impression  Patient here with posttussive emesis.  She denies abdominal pain.  She states her coughing started last night.  She states that she is Covid vaccinated.  We will get a chest x-ray, abdominal labs.  She has a history of chronic alcohol abuse.  Her blood pressure and heart rate are currently elevated.  Question potential withdrawal.  Have added on CIWA protocol.   Romona Curls, PA-C 05/10/20 1155

## 2020-05-10 NOTE — ED Triage Notes (Signed)
Pt here with c/o and pain and n/v  That started around 1 am this morning , has not bee around anyone sick recently

## 2020-05-11 ENCOUNTER — Other Ambulatory Visit: Payer: Self-pay

## 2020-05-11 DIAGNOSIS — Z20822 Contact with and (suspected) exposure to covid-19: Secondary | ICD-10-CM | POA: Diagnosis present

## 2020-05-11 DIAGNOSIS — K859 Acute pancreatitis without necrosis or infection, unspecified: Secondary | ICD-10-CM | POA: Diagnosis present

## 2020-05-11 DIAGNOSIS — I674 Hypertensive encephalopathy: Secondary | ICD-10-CM | POA: Diagnosis not present

## 2020-05-11 DIAGNOSIS — K860 Alcohol-induced chronic pancreatitis: Secondary | ICD-10-CM | POA: Diagnosis present

## 2020-05-11 DIAGNOSIS — R569 Unspecified convulsions: Secondary | ICD-10-CM | POA: Diagnosis not present

## 2020-05-11 DIAGNOSIS — I6783 Posterior reversible encephalopathy syndrome: Secondary | ICD-10-CM | POA: Diagnosis present

## 2020-05-11 DIAGNOSIS — R112 Nausea with vomiting, unspecified: Secondary | ICD-10-CM | POA: Diagnosis present

## 2020-05-11 DIAGNOSIS — Z833 Family history of diabetes mellitus: Secondary | ICD-10-CM | POA: Diagnosis not present

## 2020-05-11 DIAGNOSIS — K8521 Alcohol induced acute pancreatitis with uninfected necrosis: Secondary | ICD-10-CM | POA: Diagnosis not present

## 2020-05-11 DIAGNOSIS — G9340 Encephalopathy, unspecified: Secondary | ICD-10-CM | POA: Diagnosis not present

## 2020-05-11 DIAGNOSIS — Z8261 Family history of arthritis: Secondary | ICD-10-CM | POA: Diagnosis not present

## 2020-05-11 DIAGNOSIS — N179 Acute kidney failure, unspecified: Secondary | ICD-10-CM | POA: Diagnosis present

## 2020-05-11 DIAGNOSIS — E872 Acidosis: Secondary | ICD-10-CM | POA: Diagnosis not present

## 2020-05-11 DIAGNOSIS — J45909 Unspecified asthma, uncomplicated: Secondary | ICD-10-CM | POA: Diagnosis not present

## 2020-05-11 DIAGNOSIS — J384 Edema of larynx: Secondary | ICD-10-CM | POA: Diagnosis not present

## 2020-05-11 DIAGNOSIS — J9601 Acute respiratory failure with hypoxia: Secondary | ICD-10-CM | POA: Diagnosis not present

## 2020-05-11 DIAGNOSIS — J9621 Acute and chronic respiratory failure with hypoxia: Secondary | ICD-10-CM | POA: Diagnosis not present

## 2020-05-11 DIAGNOSIS — K852 Alcohol induced acute pancreatitis without necrosis or infection: Secondary | ICD-10-CM

## 2020-05-11 DIAGNOSIS — R451 Restlessness and agitation: Secondary | ICD-10-CM | POA: Diagnosis present

## 2020-05-11 DIAGNOSIS — R609 Edema, unspecified: Secondary | ICD-10-CM | POA: Diagnosis not present

## 2020-05-11 DIAGNOSIS — J9811 Atelectasis: Secondary | ICD-10-CM | POA: Diagnosis not present

## 2020-05-11 DIAGNOSIS — F1010 Alcohol abuse, uncomplicated: Secondary | ICD-10-CM | POA: Diagnosis not present

## 2020-05-11 DIAGNOSIS — Z9911 Dependence on respirator [ventilator] status: Secondary | ICD-10-CM | POA: Diagnosis not present

## 2020-05-11 DIAGNOSIS — F329 Major depressive disorder, single episode, unspecified: Secondary | ICD-10-CM | POA: Diagnosis present

## 2020-05-11 DIAGNOSIS — G40901 Epilepsy, unspecified, not intractable, with status epilepticus: Secondary | ICD-10-CM | POA: Diagnosis not present

## 2020-05-11 DIAGNOSIS — J9600 Acute respiratory failure, unspecified whether with hypoxia or hypercapnia: Secondary | ICD-10-CM | POA: Diagnosis not present

## 2020-05-11 DIAGNOSIS — T17500A Unspecified foreign body in bronchus causing asphyxiation, initial encounter: Secondary | ICD-10-CM | POA: Diagnosis not present

## 2020-05-11 DIAGNOSIS — I161 Hypertensive emergency: Secondary | ICD-10-CM | POA: Diagnosis not present

## 2020-05-11 DIAGNOSIS — G43909 Migraine, unspecified, not intractable, without status migrainosus: Secondary | ICD-10-CM | POA: Diagnosis present

## 2020-05-11 DIAGNOSIS — G40A01 Absence epileptic syndrome, not intractable, with status epilepticus: Secondary | ICD-10-CM | POA: Diagnosis present

## 2020-05-11 DIAGNOSIS — G9341 Metabolic encephalopathy: Secondary | ICD-10-CM | POA: Diagnosis not present

## 2020-05-11 DIAGNOSIS — J4520 Mild intermittent asthma, uncomplicated: Secondary | ICD-10-CM | POA: Diagnosis present

## 2020-05-11 DIAGNOSIS — D649 Anemia, unspecified: Secondary | ICD-10-CM | POA: Diagnosis present

## 2020-05-11 DIAGNOSIS — R4701 Aphasia: Secondary | ICD-10-CM | POA: Diagnosis not present

## 2020-05-11 DIAGNOSIS — F10131 Alcohol abuse with withdrawal delirium: Secondary | ICD-10-CM | POA: Diagnosis present

## 2020-05-11 DIAGNOSIS — T17890A Other foreign object in other parts of respiratory tract causing asphyxiation, initial encounter: Secondary | ICD-10-CM | POA: Diagnosis not present

## 2020-05-11 DIAGNOSIS — I10 Essential (primary) hypertension: Secondary | ICD-10-CM | POA: Diagnosis present

## 2020-05-11 LAB — URINALYSIS, ROUTINE W REFLEX MICROSCOPIC
Bilirubin Urine: NEGATIVE
Glucose, UA: NEGATIVE mg/dL
Ketones, ur: 20 mg/dL — AB
Leukocytes,Ua: NEGATIVE
Nitrite: NEGATIVE
Protein, ur: 30 mg/dL — AB
Specific Gravity, Urine: 1.016 (ref 1.005–1.030)
pH: 6 (ref 5.0–8.0)

## 2020-05-11 LAB — COMPREHENSIVE METABOLIC PANEL
ALT: 42 U/L (ref 0–44)
AST: 69 U/L — ABNORMAL HIGH (ref 15–41)
Albumin: 3.9 g/dL (ref 3.5–5.0)
Alkaline Phosphatase: 45 U/L (ref 38–126)
Anion gap: 16 — ABNORMAL HIGH (ref 5–15)
BUN: 19 mg/dL (ref 6–20)
CO2: 19 mmol/L — ABNORMAL LOW (ref 22–32)
Calcium: 8.3 mg/dL — ABNORMAL LOW (ref 8.9–10.3)
Chloride: 100 mmol/L (ref 98–111)
Creatinine, Ser: 1.19 mg/dL — ABNORMAL HIGH (ref 0.44–1.00)
GFR calc Af Amer: >60 mL/min (ref >60)
GFR calc non Af Amer: >60 mL/min (ref >60)
Glucose, Bld: 95 mg/dL (ref 70–99)
Potassium: 4.5 mmol/L (ref 3.5–5.1)
Sodium: 135 mmol/L (ref 135–145)
Total Bilirubin: 1 mg/dL (ref 0.3–1.2)
Total Protein: 7.7 g/dL (ref 6.5–8.1)

## 2020-05-11 LAB — CREATININE, SERUM
Creatinine, Ser: 1.22 mg/dL — ABNORMAL HIGH (ref 0.44–1.00)
GFR calc Af Amer: >60 mL/min (ref >60)
GFR calc non Af Amer: 59 mL/min — ABNORMAL LOW (ref >60)

## 2020-05-11 LAB — LACTIC ACID, PLASMA: Lactic Acid, Venous: 1 mmol/L (ref 0.5–1.9)

## 2020-05-11 LAB — MAGNESIUM: Magnesium: 1.6 mg/dL — ABNORMAL LOW (ref 1.7–2.4)

## 2020-05-11 LAB — PHOSPHORUS: Phosphorus: 1.8 mg/dL — ABNORMAL LOW (ref 2.5–4.6)

## 2020-05-11 LAB — HIV ANTIBODY (ROUTINE TESTING W REFLEX): HIV Screen 4th Generation wRfx: NONREACTIVE

## 2020-05-11 MED ORDER — HYDROMORPHONE HCL 1 MG/ML IJ SOLN
1.0000 mg | INTRAMUSCULAR | Status: DC | PRN
  Administered 2020-05-11 – 2020-05-14 (×2): 1 mg via INTRAVENOUS
  Filled 2020-05-11 (×2): qty 1

## 2020-05-11 MED ORDER — ENOXAPARIN SODIUM 40 MG/0.4ML ~~LOC~~ SOLN
40.0000 mg | Freq: Every day | SUBCUTANEOUS | Status: DC
  Administered 2020-05-11 – 2020-05-16 (×7): 40 mg via SUBCUTANEOUS
  Filled 2020-05-11 (×7): qty 0.4

## 2020-05-11 MED ORDER — LORAZEPAM 2 MG/ML IJ SOLN
1.0000 mg | INTRAMUSCULAR | Status: AC | PRN
  Administered 2020-05-13: 2 mg via INTRAVENOUS
  Filled 2020-05-11 (×3): qty 1

## 2020-05-11 MED ORDER — LEVETIRACETAM 500 MG PO TABS
500.0000 mg | ORAL_TABLET | Freq: Two times a day (BID) | ORAL | Status: DC
  Administered 2020-05-11 – 2020-05-12 (×5): 500 mg via ORAL
  Filled 2020-05-11 (×5): qty 1

## 2020-05-11 MED ORDER — ACETAMINOPHEN 650 MG RE SUPP: 650.0000 mg | Freq: Four times a day (QID) | RECTAL | Status: DC | PRN

## 2020-05-11 MED ORDER — ONDANSETRON HCL 4 MG PO TABS: 4.0000 mg | ORAL_TABLET | Freq: Four times a day (QID) | ORAL | Status: DC | PRN

## 2020-05-11 MED ORDER — ADULT MULTIVITAMIN W/MINERALS CH
1.0000 | ORAL_TABLET | Freq: Every day | ORAL | Status: DC
  Administered 2020-05-11: 1 via ORAL
  Filled 2020-05-11 (×3): qty 1

## 2020-05-11 MED ORDER — LORAZEPAM 1 MG PO TABS
1.0000 mg | ORAL_TABLET | ORAL | Status: AC | PRN
  Administered 2020-05-13: 1 mg via ORAL

## 2020-05-11 MED ORDER — LORAZEPAM 2 MG/ML IJ SOLN
0.0000 mg | Freq: Two times a day (BID) | INTRAMUSCULAR | Status: AC
  Administered 2020-05-14 (×2): 1 mg via INTRAVENOUS
  Filled 2020-05-11 (×2): qty 1

## 2020-05-11 MED ORDER — FOLIC ACID 1 MG PO TABS
1.0000 mg | ORAL_TABLET | Freq: Every day | ORAL | Status: DC
  Administered 2020-05-11 – 2020-05-16 (×4): 1 mg via ORAL
  Filled 2020-05-11 (×5): qty 1

## 2020-05-11 MED ORDER — LACTATED RINGERS IV SOLN: INTRAVENOUS | Status: DC

## 2020-05-11 MED ORDER — ALBUTEROL SULFATE HFA 108 (90 BASE) MCG/ACT IN AERS
1.0000 | INHALATION_SPRAY | Freq: Four times a day (QID) | RESPIRATORY_TRACT | Status: DC | PRN
  Filled 2020-05-11: qty 6.7

## 2020-05-11 MED ORDER — SODIUM PHOSPHATES 45 MMOLE/15ML IV SOLN
15.0000 mmol | Freq: Once | INTRAVENOUS | Status: AC
  Administered 2020-05-11: 15 mmol via INTRAVENOUS
  Filled 2020-05-11: qty 5

## 2020-05-11 MED ORDER — ONDANSETRON HCL 4 MG/2ML IJ SOLN
4.0000 mg | Freq: Four times a day (QID) | INTRAMUSCULAR | Status: DC | PRN
  Administered 2020-05-11: 4 mg via INTRAVENOUS
  Filled 2020-05-11: qty 2

## 2020-05-11 MED ORDER — LISINOPRIL 10 MG PO TABS
20.0000 mg | ORAL_TABLET | Freq: Every day | ORAL | Status: DC
  Administered 2020-05-11 – 2020-05-15 (×4): 20 mg via ORAL
  Filled 2020-05-11 (×3): qty 1
  Filled 2020-05-11: qty 2

## 2020-05-11 MED ORDER — ACETAMINOPHEN 325 MG PO TABS: 650.0000 mg | ORAL_TABLET | Freq: Four times a day (QID) | ORAL | Status: DC | PRN

## 2020-05-11 MED ORDER — METOPROLOL TARTRATE 25 MG PO TABS
25.0000 mg | ORAL_TABLET | Freq: Two times a day (BID) | ORAL | Status: DC
  Administered 2020-05-11 – 2020-05-13 (×7): 25 mg via ORAL
  Filled 2020-05-11 (×7): qty 1

## 2020-05-11 MED ORDER — THIAMINE HCL 100 MG/ML IJ SOLN
100.0000 mg | Freq: Every day | INTRAMUSCULAR | Status: DC
  Administered 2020-05-14 – 2020-05-15 (×2): 100 mg via INTRAVENOUS
  Filled 2020-05-11 (×3): qty 2

## 2020-05-11 MED ORDER — LORAZEPAM 2 MG/ML IJ SOLN
0.0000 mg | Freq: Four times a day (QID) | INTRAMUSCULAR | Status: AC
  Administered 2020-05-12: 2 mg via INTRAVENOUS
  Filled 2020-05-11: qty 1

## 2020-05-11 MED ORDER — THIAMINE HCL 100 MG PO TABS
100.0000 mg | ORAL_TABLET | Freq: Every day | ORAL | Status: DC
  Administered 2020-05-11 – 2020-05-16 (×4): 100 mg via ORAL
  Filled 2020-05-11 (×4): qty 1

## 2020-05-11 MED ORDER — LABETALOL HCL 5 MG/ML IV SOLN
10.0000 mg | INTRAVENOUS | Status: DC | PRN
  Administered 2020-05-12 – 2020-05-14 (×8): 10 mg via INTRAVENOUS
  Filled 2020-05-11 (×9): qty 4

## 2020-05-11 MED ORDER — MAGNESIUM SULFATE 2 GM/50ML IV SOLN
2.0000 g | Freq: Once | INTRAVENOUS | Status: AC
  Administered 2020-05-11: 2 g via INTRAVENOUS
  Filled 2020-05-11: qty 50

## 2020-05-11 NOTE — ED Notes (Signed)
Patient hard stick. Lab techwill stick pt.

## 2020-05-11 NOTE — H&P (Signed)
History and Physical    CHESLEY VALLS PYP:[id number] DOB: 1991-05-20 DOA: 05/10/2020  PCP: Orland Mustard, MD   Patient coming from: Home  Chief Complaint: Abdominal pain, nausea and vomiting.  HPI: Donna Short is a 29 y.o. female with medical history significant for hypertension, mild intermittent asthma, migraine headaches, seizure disorder, alcohol abuse who presents for evaluation of abdominal pain associated with nausea and vomiting.  She reports that she began to not feel well afternoon of May 09, 2020 with abdominal pain.  She described it as a pain in the mid abdomen that did not radiate and would vary in intensity but never completely went away.  She reported at its worst pain was a 10 out of 10.  Pain was associated with nausea and vomiting.  She reports multiple episodes of vomiting but did not have any coffee-ground emesis or bright red blood when she would vomit.  She denies having any cough, fever, chest pain, pressure or palpitations.  She does report having a mild dry cough which has been present for a few weeks and unchanged.  She reports multiple stressors at home due to family issues and recent death in the family.  She reports that she does drink alcohol most days of the week and does drink heavily.  She reports that she last drank on the morning of May 10, 2020.  At the afternoon of September 16 she has repeated bouts of vomiting and pain continued to increase in intensity and occur more often so she came to the emergency room for evaluation.  ED Course: Emergency room she is found to have elevated lipase level and elevated lactic acid level on labs.  She has had a mild AKI.  CT scan of the abdomen reveals acute pancreatitis with possible small areas of necrosis in the parenchyma.  There is no fluid collection or sign of abscess.  Hospital service been asked to admit for further management.  She is found to be Covid negative.  Review of Systems:  General:  Denies weakness, fever, chills, weight loss, night sweats.  Denies dizziness.  Reports decreased appetite today HENT: Denies head trauma, headache, denies change in hearing, tinnitus.  Denies nasal congestion or bleeding.  Denies sore throat, sores in mouth.  Denies difficulty swallowing Eyes: Denies blurry vision, pain in eye, drainage.  Denies discoloration of eyes. Neck: Denies pain.  Denies swelling.  Denies pain with movement. Cardiovascular: Denies chest pain, palpitations.  Denies edema.  Denies orthopnea Respiratory: Denies shortness of breath, cough.  Denies wheezing.  Denies sputum production Gastrointestinal: Reports abdominal pain. Reports nausea, vomiting.no abdominal swelling.  Denies diarrhea.  Denies melena.  Denies hematemesis. Musculoskeletal: Denies limitation of movement.  Denies deformity or swelling.  Denies pain.  Denies arthralgias or myalgias. Genitourinary: Denies pelvic pain.  Denies urinary frequency or hesitancy.  Denies dysuria.  Skin: Denies rash.  Denies petechiae, purpura, ecchymosis. Neurological: Denies headache.  Denies syncope.  Denies seizure activity.  Denies weakness or paresthesia.  Denies slurred speech, drooping face.  Denies visual change. Psychiatric: Denies depression, anxiety.  Denies suicidal thoughts or ideation.  Denies hallucinations.  Past Medical History:  Diagnosis Date  . Asthma   . Depression   . History of chicken pox   . Hypertension   . Migraines   . Pancreatitis 01/2018  . Seizures (HCC)     Past Surgical History:  Procedure Laterality Date  . WISDOM TOOTH EXTRACTION      Social History  reports that she  quit smoking about 2 years ago. Her smoking use included cigarettes. She has never used smokeless tobacco. She reports current alcohol use of about 9.0 standard drinks of alcohol per week. She reports that she does not use drugs.  Allergies  Allergen Reactions  . Claritin [Loratadine] Itching  . Peanut-Containing Drug  Products Other (See Comments)  . Benadryl [Diphenhydramine Hcl (Sleep)] Rash  . Latex Rash  . Penicillins Itching and Rash    Has patient had a PCN reaction causing immediate rash, facial/tongue/throat swelling, SOB or lightheadedness with hypotension: Yes Has patient had a PCN reaction causing severe rash involving mucus membranes or skin necrosis: No Has patient had a PCN reaction that required hospitalization: Yes Has patient had a PCN reaction occurring within the last 10 years: No If all of the above answers are "NO", then may proceed with Cephalosporin use.;  . Shrimp [Shellfish Allergy] Hives and Rash    Family History  Problem Relation Age of Onset  . Diabetes Father   . Arthritis Father   . Alcohol abuse Father   . Breast cancer Mother   . Asthma Mother   . Drug abuse Mother   . Hypertension Mother      Prior to Admission medications   Medication Sig Start Date End Date Taking? Authorizing Provider  acetaminophen (TYLENOL) 500 MG tablet Take 1,000 mg by mouth every 6 (six) hours as needed for mild pain.    [provider]  albuterol (VENTOLIN HFA) 108 (90 Base) MCG/ACT inhaler Inhale 1-2 puffs into the lungs every 6 (six) hours as needed for wheezing or shortness of breath. 02/08/19   Wieters, Hallie C, PA-C  FLUoxetine (PROZAC) 20 MG capsule Take 1 capsule (20 mg total) by mouth daily. 12/26/19   Orland Mustard, MD  folic acid (FOLVITE) 1 MG tablet Take 1 tablet (1 mg total) by mouth daily. 12/21/19   Orland Mustard, MD  ibuprofen (ADVIL) 600 MG tablet Take 1 tablet (600 mg total) by mouth every 8 (eight) hours as needed. Patient taking differently: Take 600 mg by mouth every 8 (eight) hours as needed for moderate pain.  08/25/19   Orland Mustard, MD  levETIRAcetam (KEPPRA) 500 MG tablet Take 1 tablet (500 mg total) by mouth 2 (two) times daily. 03/14/20   Orland Mustard, MD  lisinopril (ZESTRIL) 20 MG tablet Take 1 tablet (20 mg total) by mouth daily. 12/26/19   Orland Mustard, MD  metoprolol tartrate (LOPRESSOR) 25 MG tablet Take 1 tablet (25 mg total) by mouth 2 (two) times daily. 12/26/19   Orland Mustard, MD    Physical Exam: Vitals:   05/10/20 1346 05/10/20 1730 05/10/20 1958 05/10/20 2230  BP: (!) 158/103 (!) 149/107 (!) 165/113 (!) 171/122  Pulse: (!) 128 (!) 112 (!) 129 (!) 134  Resp: 14 16 16  (!) 25  Temp: 98.6 F (37 C)     TempSrc: Oral     SpO2: 100% 100% 100% 100%  Weight:      Height:        Constitutional: NAD, calm, comfortable Vitals:   05/10/20 1346 05/10/20 1730 05/10/20 1958 05/10/20 2230  BP: (!) 158/103 (!) 149/107 (!) 165/113 (!) 171/122  Pulse: (!) 128 (!) 112 (!) 129 (!) 134  Resp: 14 16 16  (!) 25  Temp: 98.6 F (37 C)     TempSrc: Oral     SpO2: 100% 100% 100% 100%  Weight:      Height:       General: WDWN,  Alert and oriented x3.  Eyes: EOMI, PERRL, lids and conjunctivae normal.  Sclera nonicteric HENT:  /AT, external ears normal.  Nares patent without epistasis.  Mucous membranes are dry. Posterior pharynx clear of any exudate or lesions. Normal dentition.  Neck: Soft, normal range of motion, supple, no masses, no thyromegaly.  Trachea midline Respiratory: clear to auscultation bilaterally, no wheezing, no crackles. Normal respiratory effort. No accessory muscle use.  Cardiovascular: Sinus tachycardia.  No murmurs / rubs / gallops. No extremity edema. 2+ pedal pulses.  Abdomen: Soft, gastric and left upper abdominal tenderness to palpation. nondistended, no rebound or guarding.  No masses palpated. No hepatosplenomegaly. Bowel sounds normoactive Musculoskeletal: FROM. no clubbing / cyanosis. No joint deformity upper and lower extremities. Normal muscle tone.  Skin: Warm, dry, intact no rashes, lesions, ulcers. No induration Neurologic: CN 2-12 grossly intact.  Normal speech.  Sensation intact, patella DTR +2 bilaterally. Strength 5/5 in all extremities.   Psychiatric: Normal judgment and insight.  Normal mood.     Labs on Admission: I have personally reviewed following labs and imaging studies  CBC: Recent Labs  Lab 05/10/20 1203  WBC 13.1*  HGB 13.9  HCT 44.4  MCV 112.1*  PLT 200    Basic Metabolic Panel: Recent Labs  Lab 05/10/20 1203  NA 138  K 5.6*  CL 96*  CO2 9*  GLUCOSE 175*  BUN 25*  CREATININE 1.39*  CALCIUM 9.6    GFR: Estimated Creatinine Clearance: 46.4 mL/min (A) (by C-G formula based on SCr of 1.39 mg/dL (H)).  Liver Function Tests: Recent Labs  Lab 05/10/20 1203  AST 126*  ALT 61*  ALKPHOS 57  BILITOT 0.9  PROT 9.3*  ALBUMIN 4.8    Urine analysis:    Component Value Date/Time   COLORURINE YELLOW 08/27/2019 1322   APPEARANCEUR HAZY (A) 08/27/2019 1322   LABSPEC 1.024 08/27/2019 1322   PHURINE 6.0 08/27/2019 1322   GLUCOSEU NEGATIVE 08/27/2019 1322   HGBUR MODERATE (A) 08/27/2019 1322   BILIRUBINUR SMALL (A) 08/27/2019 1322   KETONESUR 80 (A) 08/27/2019 1322   PROTEINUR >=300 (A) 08/27/2019 1322   UROBILINOGEN 0.2 10/07/2015 1958   NITRITE NEGATIVE 08/27/2019 1322   LEUKOCYTESUR NEGATIVE 08/27/2019 1322    Radiological Exams on Admission: DG Chest 2 View  Result Date: 05/10/2020 CLINICAL DATA:  Cough EXAM: CHEST - 2 VIEW COMPARISON:  January 27, 2018 FINDINGS: The cardiomediastinal silhouette is unchanged in contour. No pleural effusion. No pneumothorax. No acute pleuroparenchymal abnormality. Visualized abdomen is unremarkable. No acute osseous abnormality. Dextrocurvature of the thoracic spine. IMPRESSION: No acute cardiopulmonary abnormality. Electronically Signed   By: Meda Klinefelter MD   On: 05/10/2020 12:20   CT Abdomen Pelvis W Contrast  Result Date: 05/10/2020 CLINICAL DATA:  Abdominal pain, nausea, vomiting EXAM: CT ABDOMEN AND PELVIS WITH CONTRAST TECHNIQUE: Multidetector CT imaging of the abdomen and pelvis was performed using the standard protocol following bolus administration of intravenous contrast. CONTRAST:  OMNIPAQUE  IOHEXOL 300 MG/ML  SOLN COMPARISON:  08/27/2019 FINDINGS: Lower chest: No acute abnormality. Hepatobiliary: Mild to moderate hepatic steatosis. Mild hepatomegaly. These findings are stable since prior examination. No focal intrahepatic mass identified. No intra or extrahepatic biliary ductal dilation. Gallbladder unremarkable. Pancreas: There is mild peripancreatic infiltration predominantly surrounding the head and uncinate process of the pancreas heterogeneous enhancement within the uncinate process may reflect areas of interstitial edema or tiny areas of parenchymal necrosis. Punctate foci of calcification within the tail of the pancreas are noted.  Together, the findings are in keeping with acute on chronic pancreatitis predominantly involving the proximal pancreas. The pancreatic duct is not dilated. No peripancreatic fluid collections are identified. Spleen: Unremarkable Adrenals/Urinary Tract: Adrenal glands are unremarkable. Kidneys are normal, without renal calculi, focal lesion, or hydronephrosis. Bladder is unremarkable. Stomach/Bowel: Stomach, small bowel, and large bowel are unremarkable. Appendix normal. No free intraperitoneal gas or fluid. Vascular/Lymphatic: No pathologic adenopathy within the abdomen and pelvis. The abdominal vasculature is unremarkable. Specifically, the splenic vein, superior mesenteric vein, and portal vein are patent. Reproductive: Uterus and bilateral adnexa are unremarkable. Other: Rectum unremarkable Musculoskeletal: No acute or significant osseous findings. IMPRESSION: 1. Findings in keeping with acute on chronic pancreatitis predominantly involving the proximal pancreas. Heterogeneous enhancement within the uncinate process may reflect areas of interstitial edema or tiny areas of parenchymal necrosis. No peripancreatic fluid collections are identified. 2. Mild to moderate hepatic steatosis and hepatomegaly, stable since prior examination. Electronically Signed   By: Helyn NumbersAshesh   Parikh MD   On: 05/10/2020 23:41    Assessment/Plan Principal Problem:   Acute pancreatitis Ms. Laural BenesJohnson is admitted to medical surgical floor. She is placed on Lactated Ringer for IV fluid hydration.  Pain control be provided with Dilaudid IV overnight every 3 hours for 3 doses if needed. NPO except for sips of fluid with medications and ice chips. Lactic acid level was 2.6.  Patient has received IV fluid boluses in the emergency room and will recheck lactic acid level now.  Active Problems:   Seizures (HCC) Patient has history of seizures.  Will be continued on Keppra which she takes at home. Placed on seizure precautions    Benign essential HTN Maintained on her home dose of lisinopril and metoprolol.  Monitor blood pressure.    ETOH abuse Patient placed on CIWA protocol.  Will be provided Ativan as needed for elevated CIWA score.  Started on Librium 3 times daily.  Will be given multivitamin, folic acid and thiamine daily. Social work consulted to assist with outpatient alcohol rehabilitation program for patient and patient education on alcohol abuse and its ill effects on health.    AKI (acute kidney injury) (HCC)  IV fluid hydration with LR provided.  Patient was given boluses of normal saline in the emergency room. Recheck electrolytes renal function in morning.    Tachycardia With sinus tachycardia on EKG and heart monitor.  Continue to monitor.  Patient has no chest pain or pressure.  She will be continued on her home dose of metoprolol.    DVT prophylaxis: Lovenox for DVT prophylaxis  Code Status:   Full code  Family Communication:  Notes and plan discussed with patient.  Patient verbalized understanding agrees with plan.  Further recommendations to follow as clinically indicated Disposition Plan:   Patient is from:  Home  Anticipated DC to:  Home  Anticipated DC date:  Anticipate greater than 2 midnight stay in the hospital to treat acute medical  condition  Anticipated DC barriers: No barriers to discharge identified at this time  Consults called:  Consult social work for alcohol education and assistance with finding outpatient alcohol rehabilitation program for patient Admission status:  Inpatient  Severity of Illness: The appropriate patient status for this patient is INPATIENT. Inpatient status is judged to be reasonable and necessary in order to provide the required intensity of service to ensure the patient's safety. The patient's presenting symptoms, physical exam findings, and initial radiographic and laboratory data in the context of their chronic comorbidities is felt  to place them at high risk for further clinical deterioration. Furthermore, it is not anticipated that the patient will be medically stable for discharge from the hospital within 2 midnights of admission. The following factors support the patient status of inpatient.    * I certify that at the point of admission it is my clinical judgment that the patient will require inpatient hospital care spanning beyond 2 midnights from the point of admission due to high intensity of service, high risk for further deterioration and high frequency of surveillance required.Claudean Severance Coreen Shippee MD [facility]  How to contact the [facility] Attending or Consulting provider 7A - 7P or covering provider during after hours 7P -7A, for this patient?   1. Check the care team in [facility] and look for a) attending/consulting [facility] provider listed and b) the [facility] team listed 2. Log into [email or web address] and use [facility] universal password to access. If you do not have the password, please contact the hospital operator. 3. Locate the [facility] provider you are looking for under [facility] and page to a number that you can be directly reached. 4. If you still have difficulty reaching the provider, please page the [facility] (Director on Call) for the Hospitalists listed on amion for  assistance.  05/11/2020, 12:02 AM

## 2020-05-11 NOTE — Progress Notes (Addendum)
PROGRESS NOTE  Donna Short VXB:[id number] DOB: 1991/02/13 DOA: 05/10/2020 PCP: Orland Mustard, MD   LOS: 0 days   Brief Narrative / Interim history: 29 year old female with history of HTN, mild intermittent asthma, seizure disorder, alcohol abuse who came into the hospital with abdominal pain, nausea and vomiting.  She started feeling bad 1 to 2 days prior to admission.  She has a history of chronic pancreatitis due to EtOH, and continues to drink daily.  She also complained of nausea and vomiting however without bleeding.  Subjective / 24h Interval events: Patient complains of abdominal pain but overall she is feeling better.  Nausea is persistent.  Assessment & Plan: Principal Problem Acute on chronic pancreatitis -continue supportive treatment, n.p.o., IV fluids.  CT scan of the abdomen and pelvis reviewed and showed changes of pancreatitis.  Lactic acid was initially elevated but now normalized  Active Problems EtOH with alcohol withdrawal and history of seizures -patient appears to be tremulous on my evaluation in the ED.  Continue CIWA.  Continue Keppra that she takes at home.  Essential hypertension-continue home medications  Acute kidney injury-continue fluids, monitor renal function, baseline creatinine 0.7-0.9, on admission was 1.4.  Currently improving at 1.2.  Sinus tachycardia-likely reactive due to pancreatitis.  Continue fluids  Hypomagnesemia, hypophosphatemia - replete and monitor, due to ETOH  Hyperkalemia - due to AKI, improved with fluids  Scheduled Meds: . enoxaparin (LOVENOX) injection  40 mg Subcutaneous QHS  . folic acid  1 mg Oral Daily  . levETIRAcetam  500 mg Oral BID  . lisinopril  20 mg Oral Daily  . LORazepam  0-4 mg Intravenous Q6H   Or  . LORazepam  0-4 mg Oral Q6H  . [START ON 05/12/2020] LORazepam  0-4 mg Intravenous Q12H   Or  . [START ON 05/12/2020] LORazepam  0-4 mg Oral Q12H  . LORazepam  0-4 mg Intravenous Q6H   Followed by  .  [START ON 05/13/2020] LORazepam  0-4 mg Intravenous Q12H  . LORazepam  2 mg Intravenous Once  . LORazepam  1 mg Oral Once  . metoprolol tartrate  25 mg Oral BID  . multivitamin with minerals  1 tablet Oral Daily  . thiamine  100 mg Oral Daily   Or  . thiamine  100 mg Intravenous Daily  . thiamine  100 mg Oral Daily   Or  . thiamine  100 mg Intravenous Daily   Continuous Infusions: . lactated ringers 100 mL/hr at 05/11/20 0101  . sodium phosphate  Dextrose 5% IVPB 15 mmol (05/11/20 0857)   PRN Meds:.acetaminophen **OR** acetaminophen, albuterol, HYDROmorphone (DILAUDID) injection, LORazepam **OR** LORazepam, ondansetron **OR** ondansetron (ZOFRAN) IV  Diet Orders (From admission, onward)    Start     Ordered   05/11/20 0007  Diet NPO time specified Except for: Sips with Meds  Diet effective now       Question:  Except for  Answer:  Sips with Meds   05/11/20 0006          DVT prophylaxis: enoxaparin (LOVENOX) injection 40 mg Start: 05/11/20 0015     Code Status: Full Code  Family Communication: No family present at bedside  Status is: Inpatient  Remains inpatient appropriate because:IV treatments appropriate due to intensity of illness or inability to take PO   Dispo: The patient is from: Home              Anticipated d/c is to: Home  Anticipated d/c date is: 3 days              Patient currently is not medically stable to d/c.  Consultants:  None  Procedures:  none  Microbiology  none  Antimicrobials: none    Objective: Vitals:   05/11/20 0831 05/11/20 0845 05/11/20 0900 05/11/20 1032  BP: (!) 178/132 (!) 182/132 (!) 188/132 (!) 182/134  Pulse: (!) 108 (!) 104 (!) 106   Resp:      Temp:      TempSrc:      SpO2:  99% 100%   Weight:      Height:        Intake/Output Summary (Last 24 hours) at 05/11/2020 1035 Last data filed at 05/11/2020 0802 Gross per 24 hour  Intake 2050 ml  Output --  Net 2050 ml   Filed Weights   05/10/20 1131   Weight: 58.1 kg    Examination:  Constitutional: NAD, visibly tremulous Eyes: no scleral icterus ENMT: Mucous membranes are moist.  Neck: normal, supple Respiratory: clear to auscultation bilaterally, no wheezing, no crackles. Cardiovascular: Regular rate and rhythm, no murmurs / rubs / gallops. Tachycardic  Abdomen: Tenderness to palpation in the epigastric area, no guarding or rebound Musculoskeletal: no clubbing / cyanosis.  Skin: no rashes Neurologic: CN 2-12 grossly intact. Strength 5/5 in all 4.   Data Reviewed: I have independently reviewed following labs and imaging studies   CBC: Recent Labs  Lab 05/10/20 1203  WBC 13.1*  HGB 13.9  HCT 44.4  MCV 112.1*  PLT 200   Basic Metabolic Panel: Recent Labs  Lab 05/10/20 1203 05/11/20 0410  NA 138 135  K 5.6* 4.5  CL 96* 100  CO2 9* 19*  GLUCOSE 175* 95  BUN 25* 19  CREATININE 1.39* 1.19*  1.22*  CALCIUM 9.6 8.3*  MG  --  1.6*  PHOS  --  1.8*   Liver Function Tests: Recent Labs  Lab 05/10/20 1203 05/11/20 0410  AST 126* 69*  ALT 61* 42  ALKPHOS 57 45  BILITOT 0.9 1.0  PROT 9.3* 7.7  ALBUMIN 4.8 3.9   Coagulation Profile: No results for input(s): INR, PROTIME in the last 168 hours. HbA1C: No results for input(s): HGBA1C in the last 72 hours. CBG: No results for input(s): GLUCAP in the last 168 hours.  Recent Results (from the past 240 hour(s))  SARS Coronavirus 2 by RT PCR (hospital order, performed in [facility] hospital lab) Nasopharyngeal Nasopharyngeal Swab     Status: None   Collection Time: 05/10/20 11:54 AM   Specimen: Nasopharyngeal Swab  Result Value Ref Range Status   SARS Coronavirus 2 NEGATIVE NEGATIVE Final    Comment: (NOTE) SARS-CoV-2 target nucleic acids are NOT DETECTED.  The SARS-CoV-2 RNA is generally detectable in upper and lower respiratory specimens during the acute phase of infection. The lowest concentration of SARS-CoV-2 viral copies this assay can detect is  250 copies / mL. A negative result does not preclude SARS-CoV-2 infection and should not be used as the sole basis for treatment or other patient management decisions.  A negative result may occur with improper specimen collection / handling, submission of specimen other than nasopharyngeal swab, presence of viral mutation(s) within the areas targeted by this assay, and inadequate number of viral copies (<250 copies / mL). A negative result must be combined with clinical observations, patient history, and epidemiological information.  Fact Sheet for Patients:   BoilerBrush.com.cy  Fact Sheet for Healthcare Providers:  https://pope.com/  This test is not yet approved or  cleared by the [place] and has been authorized for detection and/or diagnosis of SARS-CoV-2 by FDA under an Emergency Use Authorization (EUA).  This EUA will remain in effect (meaning this test can be used) for the duration of the COVID-19 declaration under Section 564(b)(1) of the Act, 21 U.S.C. section 360bbb-3(b)(1), unless the authorization is terminated or revoked sooner.  Performed at [facility], [address], [place], [place] [address]      Radiology Studies: DG Chest 2 View  Result Date: 05/10/2020 CLINICAL DATA:  Cough EXAM: CHEST - 2 VIEW COMPARISON:  January 27, 2018 FINDINGS: The cardiomediastinal silhouette is unchanged in contour. No pleural effusion. No pneumothorax. No acute pleuroparenchymal abnormality. Visualized abdomen is unremarkable. No acute osseous abnormality. Dextrocurvature of the thoracic spine. IMPRESSION: No acute cardiopulmonary abnormality. Electronically Signed   By: Meda Klinefelter MD   On: 05/10/2020 12:20   CT Abdomen Pelvis W Contrast  Result Date: 05/10/2020 CLINICAL DATA:  Abdominal pain, nausea, vomiting EXAM: CT ABDOMEN AND PELVIS WITH CONTRAST TECHNIQUE: Multidetector CT imaging of the abdomen and pelvis was  performed using the standard protocol following bolus administration of intravenous contrast. CONTRAST:  OMNIPAQUE IOHEXOL 300 MG/ML  SOLN COMPARISON:  08/27/2019 FINDINGS: Lower chest: No acute abnormality. Hepatobiliary: Mild to moderate hepatic steatosis. Mild hepatomegaly. These findings are stable since prior examination. No focal intrahepatic mass identified. No intra or extrahepatic biliary ductal dilation. Gallbladder unremarkable. Pancreas: There is mild peripancreatic infiltration predominantly surrounding the head and uncinate process of the pancreas heterogeneous enhancement within the uncinate process may reflect areas of interstitial edema or tiny areas of parenchymal necrosis. Punctate foci of calcification within the tail of the pancreas are noted. Together, the findings are in keeping with acute on chronic pancreatitis predominantly involving the proximal pancreas. The pancreatic duct is not dilated. No peripancreatic fluid collections are identified. Spleen: Unremarkable Adrenals/Urinary Tract: Adrenal glands are unremarkable. Kidneys are normal, without renal calculi, focal lesion, or hydronephrosis. Bladder is unremarkable. Stomach/Bowel: Stomach, small bowel, and large bowel are unremarkable. Appendix normal. No free intraperitoneal gas or fluid. Vascular/Lymphatic: No pathologic adenopathy within the abdomen and pelvis. The abdominal vasculature is unremarkable. Specifically, the splenic vein, superior mesenteric vein, and portal vein are patent. Reproductive: Uterus and bilateral adnexa are unremarkable. Other: Rectum unremarkable Musculoskeletal: No acute or significant osseous findings. IMPRESSION: 1. Findings in keeping with acute on chronic pancreatitis predominantly involving the proximal pancreas. Heterogeneous enhancement within the uncinate process may reflect areas of interstitial edema or tiny areas of parenchymal necrosis. No peripancreatic fluid collections are identified. 2.  Mild to moderate hepatic steatosis and hepatomegaly, stable since prior examination. Electronically Signed   By: Helyn Numbers MD   On: 05/10/2020 23:41    Pamella Pert, MD, PhD [facility]  Between 7 am - 7 pm I am available, please contact me via Amion or Securechat  Between 7 pm - 7 am I am not available, please contact night coverage MD/APP via Amion

## 2020-05-12 LAB — COMPREHENSIVE METABOLIC PANEL
ALT: 40 U/L (ref 0–44)
AST: 58 U/L — ABNORMAL HIGH (ref 15–41)
Albumin: 3.9 g/dL (ref 3.5–5.0)
Alkaline Phosphatase: 44 U/L (ref 38–126)
Anion gap: 16 — ABNORMAL HIGH (ref 5–15)
BUN: 11 mg/dL (ref 6–20)
CO2: 20 mmol/L — ABNORMAL LOW (ref 22–32)
Calcium: 9.3 mg/dL (ref 8.9–10.3)
Chloride: 95 mmol/L — ABNORMAL LOW (ref 98–111)
Creatinine, Ser: 0.92 mg/dL (ref 0.44–1.00)
GFR calc Af Amer: >60 mL/min (ref >60)
GFR calc non Af Amer: >60 mL/min (ref >60)
Glucose, Bld: 149 mg/dL — ABNORMAL HIGH (ref 70–99)
Potassium: 3.9 mmol/L (ref 3.5–5.1)
Sodium: 131 mmol/L — ABNORMAL LOW (ref 135–145)
Total Bilirubin: 1 mg/dL (ref 0.3–1.2)
Total Protein: 7.9 g/dL (ref 6.5–8.1)

## 2020-05-12 LAB — CBC
HCT: 42.7 % (ref 36.0–46.0)
Hemoglobin: 14.9 g/dL (ref 12.0–15.0)
MCH: 34.2 pg — ABNORMAL HIGH (ref 26.0–34.0)
MCHC: 34.9 g/dL (ref 30.0–36.0)
MCV: 97.9 fL (ref 80.0–100.0)
Platelets: 113 10*3/uL — ABNORMAL LOW (ref 150–400)
RBC: 4.36 MIL/uL (ref 3.87–5.11)
RDW: 10.7 % — ABNORMAL LOW (ref 11.5–15.5)
WBC: 10 10*3/uL (ref 4.0–10.5)
nRBC: 0 % (ref 0.0–0.2)

## 2020-05-12 MED ORDER — HYDRALAZINE HCL 10 MG PO TABS
10.0000 mg | ORAL_TABLET | Freq: Three times a day (TID) | ORAL | Status: DC
  Administered 2020-05-12 (×2): 10 mg via ORAL
  Filled 2020-05-12 (×2): qty 1

## 2020-05-12 NOTE — Progress Notes (Signed)
Patient had a witnessed seizures back to back this morning. First seizures lasted for  .Shaking was mostly in the left hand and faced turned to the left. A minute after the first one lasted, another episode was witnessed in the right side of the body. No drooling or incontinence episode. Ativan 2mg  given total. A/Ox 4 after seizures. Will continue to monitor.

## 2020-05-12 NOTE — Progress Notes (Signed)
PROGRESS NOTE  Donna Short XNA:[id number] DOB: 1991-05-07 DOA: 05/10/2020 PCP: Orland Mustard, MD   LOS: 1 day   Brief Narrative / Interim history: 29 year old female with history of HTN, mild intermittent asthma, seizure disorder, alcohol abuse who came into the hospital with abdominal pain, nausea and vomiting.  She started feeling bad 1 to 2 days prior to admission.  She has a history of chronic pancreatitis due to EtOH, and continues to drink daily.  She also complained of nausea and vomiting however without bleeding.  Subjective / 24h Interval events: Apparently had 2 seizures this morning and received Ativan  On my evaluation she is a bit drowsy but alert. States that she is feeling poorly in general without specifics. She tells me that even at home she has seizures once in a while.  Assessment & Plan: Principal Problem Acute on chronic pancreatitis  -Continue supportive treatment, IV fluids. She was advanced to a clear liquid diet last night  Active Problems EtOH with alcohol withdrawal and history of seizures -Still triggering CIWA but improved. Had a breakthrough seizure this morning, continue Keppra and closely monitor. -If she has recurrent seizures consult neurology  Essential hypertension-she is persistently hypertensive, and hydralazine admission to metoprolol, continue IV PRNs  Acute kidney injury-continue fluids, monitor renal function, baseline creatinine 0.7-0.9, on admission was 1.4. Her creatinine is currently at baseline  Sinus tachycardia-likely reactive due to pancreatitis.  Continue fluids  Hypomagnesemia, hypophosphatemia - replete and monitor, due to ETOH. Repeat in the morning  Hyperkalemia - due to AKI, improved with fluids  Scheduled Meds: . enoxaparin (LOVENOX) injection  40 mg Subcutaneous QHS  . folic acid  1 mg Oral Daily  . hydrALAZINE  10 mg Oral Q8H  . levETIRAcetam  500 mg Oral BID  . lisinopril  20 mg Oral Daily  . LORazepam  0-4 mg  Intravenous Q6H   Followed by  . [START ON 05/13/2020] LORazepam  0-4 mg Intravenous Q12H  . LORazepam  2 mg Intravenous Once  . metoprolol tartrate  25 mg Oral BID  . multivitamin with minerals  1 tablet Oral Daily  . thiamine  100 mg Oral Daily   Or  . thiamine  100 mg Intravenous Daily   Continuous Infusions: . lactated ringers 100 mL/hr at 05/12/20 0225   PRN Meds:.acetaminophen **OR** acetaminophen, albuterol, HYDROmorphone (DILAUDID) injection, labetalol, LORazepam **OR** LORazepam, ondansetron **OR** ondansetron (ZOFRAN) IV  Diet Orders (From admission, onward)    Start     Ordered   05/11/20 1751  Diet clear liquid Room service appropriate? Yes; Fluid consistency: Thin  Diet effective now       Question Answer Comment  Room service appropriate? Yes   Fluid consistency: Thin      05/11/20 1750          DVT prophylaxis: enoxaparin (LOVENOX) injection 40 mg Start: 05/11/20 0015     Code Status: Full Code  Family Communication: No family present at bedside  Status is: Inpatient  Remains inpatient appropriate because:IV treatments appropriate due to intensity of illness or inability to take PO   Dispo: The patient is from: Home              Anticipated d/c is to: Home              Anticipated d/c date is: 3 days              Patient currently is not medically stable to d/c.  Consultants:  None  Procedures:  none  Microbiology  none  Antimicrobials: none    Objective: Vitals:   05/12/20 1045 05/12/20 1153 05/12/20 1200 05/12/20 1242  BP: (!) 164/121 (!) 181/139    Pulse:  (!) 104 (!) 105 98  Resp:  16    Temp:  98.8 F (37.1 C)    TempSrc:  Oral    SpO2: 100% 100%    Weight:      Height:        Intake/Output Summary (Last 24 hours) at 05/12/2020 1339 Last data filed at 05/12/2020 0400 Gross per 24 hour  Intake 826.43 ml  Output --  Net 826.43 ml   Filed Weights   05/10/20 1131  Weight: 58.1 kg    Examination:  Constitutional: No  distress Eyes: No scleral icterus ENMT: Moist mucous membranes Neck: normal, supple Respiratory: Clear bilaterally, no wheezing or crackles heard Cardiovascular: Regular rate and rhythm, no murmurs appreciated. Tachycardic Abdomen: Mild tenderness to palpation epigastric area without guarding or rebound Musculoskeletal: no clubbing / cyanosis.  Skin: No rashes seen Neurologic: No focal deficits, equal strength  Data Reviewed: I have independently reviewed following labs and imaging studies   CBC: Recent Labs  Lab 05/10/20 1203 05/12/20 0331  WBC 13.1* 10.0  HGB 13.9 14.9  HCT 44.4 42.7  MCV 112.1* 97.9  PLT 200 113*   Basic Metabolic Panel: Recent Labs  Lab 05/10/20 1203 05/11/20 0410 05/12/20 0331  NA 138 135 131*  K 5.6* 4.5 3.9  CL 96* 100 95*  CO2 9* 19* 20*  GLUCOSE 175* 95 149*  BUN 25* 19 11  CREATININE 1.39* 1.19*  1.22* 0.92  CALCIUM 9.6 8.3* 9.3  MG  --  1.6*  --   PHOS  --  1.8*  --    Liver Function Tests: Recent Labs  Lab 05/10/20 1203 05/11/20 0410 05/12/20 0331  AST 126* 69* 58*  ALT 61* 42 40  ALKPHOS 57 45 44  BILITOT 0.9 1.0 1.0  PROT 9.3* 7.7 7.9  ALBUMIN 4.8 3.9 3.9   Coagulation Profile: No results for input(s): INR, PROTIME in the last 168 hours. HbA1C: No results for input(s): HGBA1C in the last 72 hours. CBG: No results for input(s): GLUCAP in the last 168 hours.  Recent Results (from the past 240 hour(s))  SARS Coronavirus 2 by RT PCR (hospital order, performed in [facility] hospital lab) Nasopharyngeal Nasopharyngeal Swab     Status: None   Collection Time: 05/10/20 11:54 AM   Specimen: Nasopharyngeal Swab  Result Value Ref Range Status   SARS Coronavirus 2 NEGATIVE NEGATIVE Final    Comment: (NOTE) SARS-CoV-2 target nucleic acids are NOT DETECTED.  The SARS-CoV-2 RNA is generally detectable in upper and lower respiratory specimens during the acute phase of infection. The lowest concentration of SARS-CoV-2 viral  copies this assay can detect is 250 copies / mL. A negative result does not preclude SARS-CoV-2 infection and should not be used as the sole basis for treatment or other patient management decisions.  A negative result may occur with improper specimen collection / handling, submission of specimen other than nasopharyngeal swab, presence of viral mutation(s) within the areas targeted by this assay, and inadequate number of viral copies (<250 copies / mL). A negative result must be combined with clinical observations, patient history, and epidemiological information.  Fact Sheet for Patients:   BoilerBrush.com.cy  Fact Sheet for Healthcare Providers: https://pope.com/  This test is not yet approved or  cleared by the [place] FDA and has been authorized for detection and/or diagnosis of SARS-CoV-2 by FDA under an Emergency Use Authorization (EUA).  This EUA will remain in effect (meaning this test can be used) for the duration of the COVID-19 declaration under Section 564(b)(1) of the Act, 21 U.S.C. section 360bbb-3(b)(1), unless the authorization is terminated or revoked sooner.  Performed at [facility], [address] [place], [place] [address]      Radiology Studies: No results found.  Pamella Pert, MD, PhD [facility]  Between 7 am - 7 pm I am available, please contact me via Amion or Securechat  Between 7 pm - 7 am I am not available, please contact night coverage MD/APP via Amion

## 2020-05-13 ENCOUNTER — Inpatient Hospital Stay (HOSPITAL_COMMUNITY): Payer: Managed Care, Other (non HMO)

## 2020-05-13 MED ORDER — LEVETIRACETAM 500 MG PO TABS
1000.0000 mg | ORAL_TABLET | Freq: Two times a day (BID) | ORAL | Status: DC
  Administered 2020-05-13 – 2020-05-15 (×5): 1000 mg via ORAL
  Filled 2020-05-13 (×6): qty 2

## 2020-05-13 MED ORDER — HYDRALAZINE HCL 10 MG PO TABS
10.0000 mg | ORAL_TABLET | Freq: Three times a day (TID) | ORAL | Status: DC
  Administered 2020-05-13 (×2): 10 mg via ORAL
  Filled 2020-05-13 (×2): qty 1

## 2020-05-13 MED ORDER — HYDRALAZINE HCL 10 MG PO TABS
10.0000 mg | ORAL_TABLET | Freq: Once | ORAL | Status: AC
  Administered 2020-05-13: 10 mg via ORAL
  Filled 2020-05-13: qty 1

## 2020-05-13 MED ORDER — HYDRALAZINE HCL 50 MG PO TABS
25.0000 mg | ORAL_TABLET | Freq: Three times a day (TID) | ORAL | Status: DC
  Administered 2020-05-13 – 2020-05-15 (×6): 25 mg via ORAL
  Filled 2020-05-13 (×6): qty 1

## 2020-05-13 NOTE — Progress Notes (Addendum)
Unable to proceed with MRI. Pt attempted to come out of MRI machine and said she could not breath. Per RN meds could potentially be given, she will reach out to ordering about meds. Would like to attempt MRI without any meds first. RN will accompany pt to monitor and see if meds will be necessary.

## 2020-05-13 NOTE — Progress Notes (Signed)
Patient had another seizure this morning-lasted 2 minutes. Noticed gaze shifting to right side and then right arm contracted and tremors began. MD Elvera Lennox made aware and writer was told to administer 1mg  of ativan IV. BP of 151/122, HR 109, O2 100% 2L, and RR 16. Will continue to monitor closely. Waiting to transfer patient to 3W.

## 2020-05-13 NOTE — Progress Notes (Signed)
PROGRESS NOTE  DISA RIEDLINGER YFV:[id number] DOB: [birth year] DOA: 05/10/2020 PCP: Orland Mustard, MD   LOS: 2 days   Brief Narrative / Interim history: 29 year old female with history of HTN, mild intermittent asthma, seizure disorder, alcohol abuse who came into the hospital with abdominal pain, nausea and vomiting.  She started feeling bad 1 to 2 days prior to admission.  She has a history of chronic pancreatitis due to EtOH, and continues to drink daily.  She also complained of nausea and vomiting however without bleeding.  Subjective / 24h Interval events: No specific complaints for me.  She had another seizure this morning, witnessed by the nurse, was generalized.  Assessment & Plan: Principal Problem Acute on chronic pancreatitis  -continue supportive treatment, IV fluids, clear liquid diet -Advance as tolerated  Active Problems EtOH with alcohol withdrawal and history of seizures -Her withdrawal appears to be improved.  She had a breakthrough seizure on 9/18 in the morning for which she received Ativan.  She had a repeat breakthrough seizure on 9/19, case was discussed with Dr. Amada Jupiter with neurology who recommended increase Keppra to 1 g twice daily.  Given recurrent seizures will transfer patient to [address]  Essential hypertension-continue metoprolol, hydralazine was added on 9/18, still elevated but trend overall improving  Acute kidney injury-continue fluids, monitor renal function, baseline creatinine 0.7-0.9, on admission was 1.4. Her creatinine is currently at baseline  Sinus tachycardia-likely reactive due to pancreatitis.  Continue fluids  Hypomagnesemia, hypophosphatemia - replete and monitor, due to ETOH. Repeat in the morning  Hyperkalemia - due to AKI, improved with fluids  Scheduled Meds: . enoxaparin (LOVENOX) injection  40 mg Subcutaneous QHS  . folic acid  1 mg Oral Daily  . hydrALAZINE  10 mg Oral Q8H  . levETIRAcetam  1,000 mg Oral BID  .  lisinopril  20 mg Oral Daily  . LORazepam  0-4 mg Intravenous Q12H  . LORazepam  2 mg Intravenous Once  . metoprolol tartrate  25 mg Oral BID  . multivitamin with minerals  1 tablet Oral Daily  . thiamine  100 mg Oral Daily   Or  . thiamine  100 mg Intravenous Daily   Continuous Infusions: . lactated ringers 100 mL/hr at 05/13/20 0241   PRN Meds:.acetaminophen **OR** acetaminophen, albuterol, HYDROmorphone (DILAUDID) injection, labetalol, LORazepam **OR** LORazepam, ondansetron **OR** ondansetron (ZOFRAN) IV  Diet Orders (From admission, onward)    Start     Ordered   05/11/20 1751  Diet clear liquid Room service appropriate? Yes; Fluid consistency: Thin  Diet effective now       Question Answer Comment  Room service appropriate? Yes   Fluid consistency: Thin      05/11/20 1750          DVT prophylaxis: enoxaparin (LOVENOX) injection 40 mg Start: 05/11/20 0015     Code Status: Full Code  Family Communication: No family present at bedside  Status is: Inpatient  Remains inpatient appropriate because:IV treatments appropriate due to intensity of illness or inability to take PO   Dispo: The patient is from: Home              Anticipated d/c is to: Home              Anticipated d/c date is: 3 days              Patient currently is not medically stable to d/c.  Consultants:  None  Procedures:  none  Microbiology  none  Antimicrobials: none    Objective: Vitals:   05/13/20 0509 05/13/20 0651 05/13/20 0700 05/13/20 1050  BP: (!) 168/121 (!) 164/113  (!) 153/112  Pulse: 94 (!) 112 96 (!) 104  Resp: 17     Temp: 98.4 F (36.9 C) 97.8 F (36.6 C)    TempSrc: Oral Oral    SpO2: 100% 100%    Weight:      Height:        Intake/Output Summary (Last 24 hours) at 05/13/2020 1105 Last data filed at 05/13/2020 1058 Gross per 24 hour  Intake 1099.76 ml  Output --  Net 1099.76 ml   Filed Weights   05/10/20 1131  Weight: 58.1 kg     Examination:  Constitutional: No distress, sleepy Eyes: No scleral icterus ENMT: Moist mucous membranes Neck: normal, supple Respiratory: Lungs are clear bilaterally, no wheezing Cardiovascular: Regular rate and rhythm, no murmurs. Abdomen: Mildly tender in the epigastric area, no guarding or rebound Musculoskeletal: no clubbing / cyanosis.  Skin: No rashes seen Neurologic: Nonfocal, equal strength  Data Reviewed: I have independently reviewed following labs and imaging studies   CBC: Recent Labs  Lab 05/10/20 1203 05/12/20 0331  WBC 13.1* 10.0  HGB 13.9 14.9  HCT 44.4 42.7  MCV 112.1* 97.9  PLT 200 113*   Basic Metabolic Panel: Recent Labs  Lab 05/10/20 1203 05/11/20 0410 05/12/20 0331  NA 138 135 131*  K 5.6* 4.5 3.9  CL 96* 100 95*  CO2 9* 19* 20*  GLUCOSE 175* 95 149*  BUN 25* 19 11  CREATININE 1.39* 1.19*  1.22* 0.92  CALCIUM 9.6 8.3* 9.3  MG  --  1.6*  --   PHOS  --  1.8*  --    Liver Function Tests: Recent Labs  Lab 05/10/20 1203 05/11/20 0410 05/12/20 0331  AST 126* 69* 58*  ALT 61* 42 40  ALKPHOS 57 45 44  BILITOT 0.9 1.0 1.0  PROT 9.3* 7.7 7.9  ALBUMIN 4.8 3.9 3.9   Coagulation Profile: No results for input(s): INR, PROTIME in the last 168 hours. HbA1C: No results for input(s): HGBA1C in the last 72 hours. CBG: No results for input(s): GLUCAP in the last 168 hours.  Recent Results (from the past 240 hour(s))  SARS Coronavirus 2 by RT PCR (hospital order, performed in [facility] hospital lab) Nasopharyngeal Nasopharyngeal Swab     Status: None   Collection Time: 05/10/20 11:54 AM   Specimen: Nasopharyngeal Swab  Result Value Ref Range Status   SARS Coronavirus 2 NEGATIVE NEGATIVE Final    Comment: (NOTE) SARS-CoV-2 target nucleic acids are NOT DETECTED.  The SARS-CoV-2 RNA is generally detectable in upper and lower respiratory specimens during the acute phase of infection. The lowest concentration of SARS-CoV-2 viral copies  this assay can detect is 250 copies / mL. A negative result does not preclude SARS-CoV-2 infection and should not be used as the sole basis for treatment or other patient management decisions.  A negative result may occur with improper specimen collection / handling, submission of specimen other than nasopharyngeal swab, presence of viral mutation(s) within the areas targeted by this assay, and inadequate number of viral copies (<250 copies / mL). A negative result must be combined with clinical observations, patient history, and epidemiological information.  Fact Sheet for Patients:   BoilerBrush.com.cy  Fact Sheet for Healthcare Providers: https://pope.com/  This test is not yet approved or  cleared by the [place] FDA and has been authorized  for detection and/or diagnosis of SARS-CoV-2 by FDA under an Emergency Use Authorization (EUA).  This EUA will remain in effect (meaning this test can be used) for the duration of the COVID-19 declaration under Section 564(b)(1) of the Act, 21 U.S.C. section 360bbb-3(b)(1), unless the authorization is terminated or revoked sooner.  Performed at [facility], [address] [place], [place] [address]      Radiology Studies: No results found.  Pamella Pert, MD, PhD [facility]  Between 7 am - 7 pm I am available, please contact me via Amion or Securechat  Between 7 pm - 7 am I am not available, please contact night coverage MD/APP via Amion

## 2020-05-13 NOTE — Progress Notes (Signed)
Patient disoriented x4. Lethargic. Incomprehensible speech and unable to follow commands. Bp 171/125. PRN Labetalol and scheduled BP medication given. Dr. Elvera Lennox at bedside assessing patient.

## 2020-05-13 NOTE — Progress Notes (Signed)
Patient had a generalized seizures this morning. Started with focal and then to generalized grand mal which last for about 2 minutes. Patient was A/Ox4 in her post-ictal state and no incontinence episode was observed.generalized weakness and slightly bit her tongue. Blood pressure (!) 164/113, pulse (!) 112, temperature 97.8 F (36.6 C), temperature source Oral, resp. rate 17, height 5\' 2"  (1.575 m), weight 58.1 kg, SpO2 100 %. Md aware of episode. Will continue to monitor.

## 2020-05-14 ENCOUNTER — Inpatient Hospital Stay (HOSPITAL_COMMUNITY): Payer: Managed Care, Other (non HMO)

## 2020-05-14 DIAGNOSIS — I6783 Posterior reversible encephalopathy syndrome: Principal | ICD-10-CM

## 2020-05-14 DIAGNOSIS — I674 Hypertensive encephalopathy: Secondary | ICD-10-CM

## 2020-05-14 DIAGNOSIS — R569 Unspecified convulsions: Secondary | ICD-10-CM

## 2020-05-14 LAB — COMPREHENSIVE METABOLIC PANEL
ALT: 28 U/L (ref 0–44)
AST: 44 U/L — ABNORMAL HIGH (ref 15–41)
Albumin: 3.1 g/dL — ABNORMAL LOW (ref 3.5–5.0)
Alkaline Phosphatase: 31 U/L — ABNORMAL LOW (ref 38–126)
Anion gap: 16 — ABNORMAL HIGH (ref 5–15)
BUN: 13 mg/dL (ref 6–20)
CO2: 22 mmol/L (ref 22–32)
Calcium: 9.1 mg/dL (ref 8.9–10.3)
Chloride: 96 mmol/L — ABNORMAL LOW (ref 98–111)
Creatinine, Ser: 0.76 mg/dL (ref 0.44–1.00)
GFR calc Af Amer: >60 mL/min (ref >60)
GFR calc non Af Amer: >60 mL/min (ref >60)
Glucose, Bld: 99 mg/dL (ref 70–99)
Potassium: 2.9 mmol/L — ABNORMAL LOW (ref 3.5–5.1)
Sodium: 134 mmol/L — ABNORMAL LOW (ref 135–145)
Total Bilirubin: 1.3 mg/dL — ABNORMAL HIGH (ref 0.3–1.2)
Total Protein: 6.7 g/dL (ref 6.5–8.1)

## 2020-05-14 LAB — BASIC METABOLIC PANEL
Anion gap: 16 — ABNORMAL HIGH (ref 5–15)
BUN: 13 mg/dL (ref 6–20)
CO2: 22 mmol/L (ref 22–32)
Calcium: 9.7 mg/dL (ref 8.9–10.3)
Chloride: 95 mmol/L — ABNORMAL LOW (ref 98–111)
Creatinine, Ser: 0.72 mg/dL (ref 0.44–1.00)
GFR calc Af Amer: >60 mL/min (ref >60)
GFR calc non Af Amer: >60 mL/min (ref >60)
Glucose, Bld: 84 mg/dL (ref 70–99)
Potassium: 4.5 mmol/L (ref 3.5–5.1)
Sodium: 133 mmol/L — ABNORMAL LOW (ref 135–145)

## 2020-05-14 LAB — CBC
HCT: 35.7 % — ABNORMAL LOW (ref 36.0–46.0)
Hemoglobin: 12.1 g/dL (ref 12.0–15.0)
MCH: 33.7 pg (ref 26.0–34.0)
MCHC: 33.9 g/dL (ref 30.0–36.0)
MCV: 99.4 fL (ref 80.0–100.0)
Platelets: 60 10*3/uL — ABNORMAL LOW (ref 150–400)
RBC: 3.59 MIL/uL — ABNORMAL LOW (ref 3.87–5.11)
RDW: 10.8 % — ABNORMAL LOW (ref 11.5–15.5)
WBC: 7.8 10*3/uL (ref 4.0–10.5)
nRBC: 0 % (ref 0.0–0.2)

## 2020-05-14 LAB — GLUCOSE, CAPILLARY
Glucose-Capillary: 103 mg/dL — ABNORMAL HIGH (ref 70–99)
Glucose-Capillary: 43 mg/dL — CL (ref 70–99)
Glucose-Capillary: 59 mg/dL — ABNORMAL LOW (ref 70–99)
Glucose-Capillary: 68 mg/dL — ABNORMAL LOW (ref 70–99)
Glucose-Capillary: 70 mg/dL (ref 70–99)
Glucose-Capillary: 73 mg/dL (ref 70–99)
Glucose-Capillary: 77 mg/dL (ref 70–99)
Glucose-Capillary: 90 mg/dL (ref 70–99)
Glucose-Capillary: 91 mg/dL (ref 70–99)

## 2020-05-14 LAB — MAGNESIUM: Magnesium: 1.5 mg/dL — ABNORMAL LOW (ref 1.7–2.4)

## 2020-05-14 LAB — MRSA PCR SCREENING: MRSA by PCR: NEGATIVE

## 2020-05-14 LAB — PHOSPHORUS: Phosphorus: 3.2 mg/dL (ref 2.5–4.6)

## 2020-05-14 MED ORDER — SODIUM CHLORIDE 0.9 % IV SOLN
INTRAVENOUS | Status: DC | PRN
  Administered 2020-05-15 – 2020-05-16 (×2): 250 mL via INTRAVENOUS

## 2020-05-14 MED ORDER — CLOBAZAM 2.5 MG/ML PO SUSP
10.0000 mg | Freq: Once | ORAL | Status: AC
  Administered 2020-05-14: 10 mg via ORAL
  Filled 2020-05-14: qty 4

## 2020-05-14 MED ORDER — DEXTROSE 50 % IV SOLN
INTRAVENOUS | Status: AC
  Administered 2020-05-14: 25 mL
  Filled 2020-05-14: qty 50

## 2020-05-14 MED ORDER — MAGNESIUM SULFATE 2 GM/50ML IV SOLN
2.0000 g | Freq: Once | INTRAVENOUS | Status: AC
  Administered 2020-05-14: 2 g via INTRAVENOUS
  Filled 2020-05-14: qty 50

## 2020-05-14 MED ORDER — ORAL CARE MOUTH RINSE
15.0000 mL | Freq: Two times a day (BID) | OROMUCOSAL | Status: DC
  Administered 2020-05-14 – 2020-05-16 (×6): 15 mL via OROMUCOSAL

## 2020-05-14 MED ORDER — CHLORHEXIDINE GLUCONATE 0.12 % MT SOLN
15.0000 mL | Freq: Two times a day (BID) | OROMUCOSAL | Status: DC
  Administered 2020-05-14 – 2020-05-16 (×5): 15 mL via OROMUCOSAL
  Filled 2020-05-14 (×3): qty 15

## 2020-05-14 MED ORDER — CHLORHEXIDINE GLUCONATE CLOTH 2 % EX PADS
6.0000 | MEDICATED_PAD | Freq: Every day | CUTANEOUS | Status: DC
  Administered 2020-05-14 – 2020-05-30 (×18): 6 via TOPICAL

## 2020-05-14 MED ORDER — DEXTROSE-NACL 5-0.9 % IV SOLN: INTRAVENOUS | Status: DC

## 2020-05-14 MED ORDER — CLEVIDIPINE BUTYRATE 0.5 MG/ML IV EMUL
0.0000 mg/h | INTRAVENOUS | Status: DC
  Administered 2020-05-14: 5 mg/h via INTRAVENOUS
  Administered 2020-05-14: 7 mg/h via INTRAVENOUS
  Administered 2020-05-14: 8 mg/h via INTRAVENOUS
  Administered 2020-05-14: 7 mg/h via INTRAVENOUS
  Administered 2020-05-14: 1 mg/h via INTRAVENOUS
  Administered 2020-05-15 (×3): 7 mg/h via INTRAVENOUS
  Filled 2020-05-14 (×11): qty 50

## 2020-05-14 MED ORDER — CLOBAZAM 10 MG PO TABS
5.0000 mg | ORAL_TABLET | Freq: Two times a day (BID) | ORAL | Status: DC
  Administered 2020-05-15: 5 mg via ORAL
  Filled 2020-05-14: qty 1

## 2020-05-14 MED ORDER — LABETALOL HCL 200 MG PO TABS
200.0000 mg | ORAL_TABLET | Freq: Three times a day (TID) | ORAL | Status: DC
  Administered 2020-05-14 – 2020-05-15 (×4): 200 mg via ORAL
  Filled 2020-05-14 (×7): qty 1

## 2020-05-14 MED ORDER — POTASSIUM CHLORIDE 10 MEQ/100ML IV SOLN
10.0000 meq | INTRAVENOUS | Status: AC
  Administered 2020-05-14 (×4): 10 meq via INTRAVENOUS
  Filled 2020-05-14 (×4): qty 100

## 2020-05-14 NOTE — Progress Notes (Signed)
Subjective: Patient remains confused  Objective: General: Resting comfortably, EEG in place  Neuro: Patient is sleeping but arouses to light touch.  She tracks to the examiner's voice but does not clearly see and cannot accurately report any visual stimulus; EOMI and face symmetric.  She does not follow simple commands such as "stick out your tongue."  She is moving all 4 extremities grossly equally.  Given continued frequent periodic lateralized epileptiform discharges on EEG and continued poor mental status, as well as can concern for potential withdrawal contributing to her symptoms, a second AED was added.  #PRES -Continue Keppra 1 g twice daily -Loading dose of 10 mg Onfi liquid dose for faster onset -5 mg Onfi twice daily starting 9/21 AM -We will continue to monitor EEG -Continue blood pressure control with a map goal of 80-90 -Please consider placement of arterial line for closer blood pressure monitoring -Continue to avoid hypotonic fluids -Continue every 4 hours neuro checks  #Hypoglycemia -25 cc/hr D5 normal saline to avoid additional hypoglycemic insult -Every 2 hour glucose checks to confirm blood glucose stable -This may be titrated as needed to maintain a normoglycemia goal per CCM   Brooke Dare MD-PhD [facility] [phone]   No charge

## 2020-05-14 NOTE — Progress Notes (Signed)
Hypoglycemic Event  CBG: 48  Treatment: 4 oz juice/soda  Symptoms: None  Follow-up CBG: Time:1555 CBG Result:70  Possible Reasons for Event: Inadequate meal intake  Comments/MD notified: Additional juice given do to borderline CBG.    Larena Sox

## 2020-05-14 NOTE — Consult Note (Signed)
NAME:  Donna Short, MRN:  [id number], DOB:  1991-07-15, LOS: 3 ADMISSION DATE:  05/10/2020, CONSULTATION DATE:  05/14/20 REFERRING MD:  Donna Short, CHIEF COMPLAINT:     Brief History   29 y.o. F admitted with ETOH withdrawal and pancreatitis.  Had seizure event and then  uncontrolled HTN on the floor and MRI showed PRES.  PCCM consulted for cleviprex gtt and ICU transfer  History of present illness   Donna Short is a 29 y.o. F with PMH HTN, asthma, seizure disorder and ETOH abuse who was admitted 9/17 for ETOH withdrawal and pancreatitis.   She was treated with CIWA protocol and developed breakthrough seizure.  She was given Keppra, but then had worsening uncontrolled HTN.  MRI was obtained which showed patchy, extensive edema consistent with PRES.  She was given multiple prn medications without improvement.  PCCM consulted for ICU transfer and cleviprex gt    Past Medical History   has a past medical history of Asthma, Depression, History of chicken pox, Hypertension, Migraines, Pancreatitis (01/2018), and Seizures (HCC).   Significant Hospital Events   9/17 admit to hospitalists 9/19 breakthrough seizure  9/20 PCCM consult, PRES     Objective   Blood pressure (!) 169/120, pulse 75, temperature 98.7 F (37.1 C), temperature source Oral, resp. rate 19, height 5\' 2"  (1.575 m), weight 58.1 kg, SpO2 99 %.        Intake/Output Summary (Last 24 hours) at 05/14/2020 05/16/2020 Last data filed at 05/14/2020 0600 Gross per 24 hour  Intake 470 ml  Output --  Net 470 ml   Filed Weights   05/10/20 1131  Weight: 58.1 kg    Examination: General: Donna Short, mittens on, no distress, snoring respirations after Ativan given HENT: Pupils 3 mm bilaterally equally reactive to light, mild pallor, no icterus Lungs: Bilateral clear breath sounds Cardiovascular: S1-S2 regular, no murmur Abdomen: Soft, nontender abdomen Extremities: No deformity, good pulses Neuro: Opens eyes to name and to  stimuli, does not follow commands, no meningeal signs   Resolved Hospital Problem list    AKI  Assessment & Plan:  Admitted for acute pancreatitis and EtOH withdrawal, course complicated by seizure and altered mental status, 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  Hypertensive emergency -blood pressure remains uncontrolled on hydralazine, lisinopril, metoprolol Needs aggressive blood pressure management, will start Cleviprex and will need transfer to ICU for this reason , goal MAP 80-90 range  Acute encephalopathy/PRE S syndrome -Per neurology On Keppra for seizures Currently lethargic after 2 mg Ativan given for MRI  EtOH withdrawal with history of seizures-on Ativan per CIWA protocol Can transition to Precedex if needed Defer to neurology if LTM EEG required, had breakthrough seizure on 9/18 and 9/19  Acute on chronic pancreatitis -clear liquid diet  Hypokalemia and hypomagnesemia will be repleted  Best practice:  Diet: NPO Pain/Anxiety/Delirium protocol (if indicated): Ativan/change to Precedex if needed VAP protocol (if indicated): N/A DVT prophylaxis: Lovenox GI prophylaxis: N/A Glucose control: N/A Mobility: Bedrest Code Status: Full code Family Communication: Per primary team Disposition: Transfer to ICU  Labs   CBC: Recent Labs  Lab 05/10/20 1203 05/12/20 0331 05/14/20 0134  WBC 13.1* 10.0 7.8  HGB 13.9 14.9 12.1  HCT 44.4 42.7 35.7*  MCV 112.1* 97.9 99.4  PLT 200 113* 60*    Basic Metabolic Panel: Recent Labs  Lab 05/10/20 1203 05/11/20 0410 05/12/20 0331 05/14/20 0134  NA 138 135 131* 134*  K 5.6* 4.5 3.9 2.9*  CL 96* 100 95* 96*  CO2 9* 19* 20* 22  GLUCOSE 175* 95 149* 99  BUN 25* 19 11 13   CREATININE 1.39* 1.19*  1.22* 0.92 0.76  CALCIUM 9.6 8.3* 9.3 9.1  MG  --  1.6*  --  1.5*  PHOS  --  1.8*  --  3.2   GFR: Estimated Creatinine Clearance: 80.6 mL/min (by C-G formula based on SCr of 0.76 mg/dL). Recent Labs  Lab 05/10/20 1203 05/10/20 1637  05/11/20 0410 05/12/20 0331 05/14/20 0134  WBC 13.1*  --   --  10.0 7.8  LATICACIDVEN  --  2.6* 1.0  --   --     Liver Function Tests: Recent Labs  Lab 05/10/20 1203 05/11/20 0410 05/12/20 0331 05/14/20 0134  AST 126* 69* 58* 44*  ALT 61* 42 40 28  ALKPHOS 57 45 44 31*  BILITOT 0.9 1.0 1.0 1.3*  PROT 9.3* 7.7 7.9 6.7  ALBUMIN 4.8 3.9 3.9 3.1*   Recent Labs  Lab 05/10/20 1203  LIPASE 101*   No results for input(s): AMMONIA in the last 168 hours.  ABG    Component Value Date/Time   PHART 7.325 (L) 01/21/2018 0406   PCO2ART 43.0 01/21/2018 0406   PO2ART 126.0 (H) 01/21/2018 0406   HCO3 9.9 (L) 11/17/2019 2006   TCO2 11 (L) 11/17/2019 2006   ACIDBASEDEF 16.0 (H) 11/17/2019 2006   O2SAT 79.0 11/17/2019 2006     Coagulation Profile: No results for input(s): INR, PROTIME in the last 168 hours.  Cardiac Enzymes: No results for input(s): CKTOTAL, CKMB, CKMBINDEX, TROPONINI in the last 168 hours.  HbA1C: Hgb A1c MFr Bld  Date/Time Value Ref Range Status  11/18/2019 06:33 AM 5.2 4.8 - 5.6 % Final    Comment:    (NOTE) Pre diabetes:          5.7%-6.4% Diabetes:              >6.4% Glycemic control for   <7.0% adults with diabetes   08/27/2019 07:10 PM 5.1 4.8 - 5.6 % Final    Comment:    (NOTE) Pre diabetes:          5.7%-6.4% Diabetes:              >6.4% Glycemic control for   <7.0% adults with diabetes     CBG: No results for input(s): GLUCAP in the last 168 hours.  Review of Systems:   Unable to obtain since patient unresponsive  Past Medical History  She,  has a past medical history of Asthma, Depression, History of chicken pox, Hypertension, Migraines, Pancreatitis (01/2018), and Seizures (HCC).   Surgical History    Past Surgical History:  Procedure Laterality Date  . WISDOM TOOTH EXTRACTION       Social History   reports that she quit smoking about 2 years ago. Her smoking use included cigarettes. She has never used smokeless tobacco. She  reports current alcohol use of about 9.0 standard drinks of alcohol per week. She reports that she does not use drugs.   Family History   Her family history includes Alcohol abuse in her father; Arthritis in her father; Asthma in her mother; Breast cancer in her mother; Diabetes in her father; Drug abuse in her mother; Hypertension in her mother.   Allergies Allergies  Allergen Reactions  . Claritin [Loratadine] Itching  . Peanut-Containing Drug Products Other (See Comments)  . Benadryl [Diphenhydramine Hcl (Sleep)] Rash  . Latex Rash  . Penicillins Itching  and Rash    Has patient had a PCN reaction causing immediate rash, facial/tongue/throat swelling, SOB or lightheadedness with hypotension: Yes Has patient had a PCN reaction causing severe rash involving mucus membranes or skin necrosis: No Has patient had a PCN reaction that required hospitalization: Yes Has patient had a PCN reaction occurring within the last 10 years: No If all of the above answers are "NO", then may proceed with Cephalosporin use.;  . Shrimp [Shellfish Allergy] Hives and Rash     Home Medications  Prior to Admission medications   Medication Sig Start Date End Date Taking? Authorizing Provider  acetaminophen (TYLENOL) 500 MG tablet Take 1,000 mg by mouth every 6 (six) hours as needed for mild pain.   Yes [provider]  albuterol (VENTOLIN HFA) 108 (90 Base) MCG/ACT inhaler Inhale 1-2 puffs into the lungs every 6 (six) hours as needed for wheezing or shortness of breath. 02/08/19  Yes Wieters, Hallie C, PA-C  FLUoxetine (PROZAC) 20 MG capsule Take 1 capsule (20 mg total) by mouth daily. 12/26/19  Yes Orland Mustard, MD  folic acid (FOLVITE) 1 MG tablet Take 1 tablet (1 mg total) by mouth daily. 12/21/19  Yes Orland Mustard, MD  levETIRAcetam (KEPPRA) 500 MG tablet Take 1 tablet (500 mg total) by mouth 2 (two) times daily. 03/14/20  Yes Orland Mustard, MD  lisinopril (ZESTRIL) 20 MG tablet Take 1 tablet (20 mg  total) by mouth daily. 12/26/19  Yes Orland Mustard, MD  metoprolol tartrate (LOPRESSOR) 25 MG tablet Take 1 tablet (25 mg total) by mouth 2 (two) times daily. 12/26/19  Yes Orland Mustard, MD  ibuprofen (ADVIL) 600 MG tablet Take 1 tablet (600 mg total) by mouth every 8 (eight) hours as needed. Patient not taking: Reported on 05/11/2020 08/25/19   Orland Mustard, MD      Cyril Mourning MD. FCCP. [facility] See Amion for pager  If no response to pager , please call [phone]  After 7:00 pm call Elink  [phone]   05/14/2020

## 2020-05-14 NOTE — Progress Notes (Signed)
Hypoglycemic Event  CBG: 68  Treatment: D50 25 mL (12.5 gm)  Symptoms: None  Follow-up CBG: Time:1019 CBG Result:91  Possible Reasons for Event: Inadequate meal intake  Comments/MD notified:    Larena Sox

## 2020-05-14 NOTE — Progress Notes (Signed)
Floor coverage  Patient has continued to have uncontrolled hypertension despite receiving hydralazine, lisinopril, metoprolol, and PRN labetalol.  She had a seizure 9/19 a.m. and brain MRI was ordered.  Received a call from radiologist informing me that the MRI revealed patchy, extensive brain edema in a pattern consistent with PRES.   -MRI also reviewed by Dr. Laurence Slate, neurology will consult.  Recommending aggressive blood pressure control with goal MAP in the 80s to 90s. -Patient is already on Keppra 1000 mg twice daily, continue -Discussed with PCCM and plan is to transfer the patient to the ICU for further management.

## 2020-05-14 NOTE — Progress Notes (Signed)
[facility] PCCM Attending  Admitted this morning, seen by Dr. Vassie Loll. Currently stable, weaning off of cleviprex per guidelines from neurology.  Will take on PCCM service  Heber Timblin, MD [place] [facility] Pager: [phone] Cell: [phone] If no response, call [phone]

## 2020-05-14 NOTE — Consult Note (Addendum)
Requesting Physician: Dr. Loney Loh    Chief Complaint: Seizure, altered mental status  History obtained from: Patient and Chart    HPI:                                                                                                                                       Donna Short is a 29 y.o. female with past medical history of hypertension, seizure disorder asthma, alcohol abuse, chronic pancreatitis admitted for abdominal pain, nausea vomiting due to acute pancreatitis.  Patient's admission complicated with withdrawal symptoms and seizure on the morning of 9/19.  Patient's Keppra dose was increased to 1 g twice daily.    Her blood pressure also remained significantly elevated, and continued to remain high despite metoprolol and hydralazine.  However patient subsequently became more confused and altered and MRI brain was ordered.  Performed today morning with changes concerning for posterior reversible encephalopathy and neurology was consulted.  Patient unable to provide any history due to aphasia and significant agitation/delirium.   Past Medical History:  Diagnosis Date  . Asthma   . Depression   . History of chicken pox   . Hypertension   . Migraines   . Pancreatitis 01/2018  . Seizures (HCC)     Past Surgical History:  Procedure Laterality Date  . WISDOM TOOTH EXTRACTION      Family History  Problem Relation Age of Onset  . Diabetes Father   . Arthritis Father   . Alcohol abuse Father   . Breast cancer Mother   . Asthma Mother   . Drug abuse Mother   . Hypertension Mother    Social History:  reports that she quit smoking about 2 years ago. Her smoking use included cigarettes. She has never used smokeless tobacco. She reports current alcohol use of about 9.0 standard drinks of alcohol per week. She reports that she does not use drugs.  Allergies:  Allergies  Allergen Reactions  . Claritin [Loratadine] Itching  . Peanut-Containing Drug Products Other (See  Comments)  . Benadryl [Diphenhydramine Hcl (Sleep)] Rash  . Latex Rash  . Penicillins Itching and Rash    Has patient had a PCN reaction causing immediate rash, facial/tongue/throat swelling, SOB or lightheadedness with hypotension: Yes Has patient had a PCN reaction causing severe rash involving mucus membranes or skin necrosis: No Has patient had a PCN reaction that required hospitalization: Yes Has patient had a PCN reaction occurring within the last 10 years: No If all of the above answers are "NO", then may proceed with Cephalosporin use.;  . Shrimp [Shellfish Allergy] Hives and Rash    Medications:  I reviewed home medications   ROS:                                                                                                                                     14 systems reviewed and negative except above   Examination:                                                                                                      General: Appears well-developed and well-nourished.  Extremely agitated. Psych: Delusional Eyes: No scleral injection HENT: No OP obstrucion Head: Normocephalic.  Cardiovascular: Normal rate and regular rhythm.  Respiratory: Effort normal and breath sounds normal to anterior ascultation GI: Soft.  No distension. There is no tenderness.  Skin: WDI    Neurological Examination Mental Status: Alert, but not oriented.  Speaking incoherently with significant paraphasic errors.  Unable to answer any questions and not following any commands Cranial Nerves: II: Visual fields : Impaired bilaterally, does not blink to threat III,IV, VI: ptosis not present, extra-ocular motions intact bilaterally, pupils equal, round, reactive to light and accommodation V,VII: smile symmetric XII: midline tongue extension Motor: Moves all 4 extremities with 5/5  strength Tone and bulk:normal tone throughout; no atrophy noted Sensory: Withdraws to noxious stimulus bilaterally Deep Tendon Reflexes: 2+ and symmetric throughout Plantars: Right: downgoing   Left: downgoing Cerebellar: No gross ataxia noted, limited ability to assess due to mental status      Lab Results: Basic Metabolic Panel: Recent Labs  Lab 05/10/20 1203 05/10/20 1203 05/11/20 0410 05/12/20 0331 05/14/20 0134  NA 138  --  135 131* 134*  K 5.6*  --  4.5 3.9 2.9*  CL 96*  --  100 95* 96*  CO2 9*  --  19* 20* 22  GLUCOSE 175*  --  95 149* 99  BUN 25*  --  19 11 13   CREATININE 1.39*  --  1.19*  1.22* 0.92 0.76  CALCIUM 9.6   < > 8.3* 9.3 9.1  MG  --   --  1.6*  --  1.5*  PHOS  --   --  1.8*  --  3.2   < > = values in this interval not displayed.    CBC: Recent Labs  Lab 05/10/20 1203 05/12/20 0331 05/14/20 0134  WBC 13.1* 10.0 7.8  HGB 13.9 14.9 12.1  HCT 44.4 42.7 35.7*  MCV 112.1* 97.9 99.4  PLT 200 113* 60*    Coagulation Studies: No results for input(s):  LABPROT, INR in the last 72 hours.  Imaging: MR BRAIN WO CONTRAST  Result Date: 05/14/2020 CLINICAL DATA:  Delirium. Seizures. Alcohol abuse with abdominal pain, nausea, and vomiting. Hypertension EXAM: MRI HEAD WITHOUT CONTRAST TECHNIQUE: Multiplanar, multiecho pulse sequences of the brain and surrounding structures were obtained without intravenous contrast. COMPARISON:  09/01/2019 FINDINGS: Brain: Patchy cortical and subcortical T2 hyperintensity in the bilateral frontal, parietal, and occipital lobes-with swelling. There is involvement the right more than left cerebellum. There is fairly symmetric involvement in the posterior circulation and watershed distributions, most consistent with posterior reversible encephalopathy syndrome. The same pattern findings were seen on comparison brain MRI, although more extensive today. There has been progression to patchy restricted diffusion along the bilateral  frontal, left parietal, and left parietooccipital cortex. Restricted diffusion also seen in the medial left thalamus, the latter likely seizure related. Gradient imaging was not acquired. No hematoma is seen. No hydrocephalus or collection. Vermian atrophy for age, likely related to history of alcohol abuse Vascular: Preserved flow voids, including superior sagittal sinus. Skull and upper cervical spine: Is normal marrow signal Sinuses/Orbits: Negative Other: Truncated motion degraded study. Coronal T2, axial T1, and gradient imaging was not acquired. A call has been placed to the ordering provider. IMPRESSION: 1. Patchy, extensive brain edema in a pattern consistent with posterior reversible encephalopathy syndrome. 2. Medial left thalamic signal abnormality correlating with seizure history. Areas of altered diffusion along the bilateral cerebral cortex could be from seizure or ischemia. 3. Truncated study. Electronically Signed   By: Marnee SpringJonathon  Watts M.D.   On: 05/14/2020 04:49     I have reviewed the above imaging: CT head and MRI brain, MRI brain show significant bilateral white matter edema consistent with PRES. also there is some restriction diffusion concerning for seizure focus.   ASSESSMENT AND PLAN   29 year old female with history of alcohol abuse admitted with acute pancreatitis with hospital admission complicated with significantly elevated blood pressure, withdrawal, seizure and worsening mental status.  MRI brain was performed early this morning consistent with PR ES.  Blood pressure overnight has continued to remain high despite as needed labetalol and hydralazine-likely need transfer to ICU for continuous IV infusion.   Posterior reversible encephalopathy syndrome (PRES) Hypertensive emergency Alcohol withdrawal Breakthrough seizures History of seizure disorder  Recommendations  #PRES Transfer to ICU for closer neurological monitoring and aggressive blood pressure control Blood  pressure control with goal blood pressure 145/90 mmHg or MAP of 80-90 Consider clevidipine gtt, discussed with hospitalist Dr. Loney Lohathore regarding goals will defer blood pressure management to medicine/CCM team Avoid hypotonic fluids such as D5 normal saline Frequent neurological exams every 4 hours  #Seizure  Will obtain stat EEG to rule out subclinical status, if routine EEG shows significant sharps/spikes then will plan for continuous EEG monitoring Continue Keppra 1 g twice daily for now Seizure precautions   #Hypertensive emergency -As stated above   #Alcohol withdrawal -Consider scheduled benzos, may help with blood pressure control -Empiric thiamine  CRITICAL CARE Performed by: Dara LordsSushanth R Ursula Dermody   Total critical care time: 45 minutes  Critical care time was exclusive of separately billable procedures and treating other patients.  Critical care was necessary to treat or prevent imminent or life-threatening deterioration.  Critical care was time spent personally by me on the following activities: development of treatment plan with patient and/or surrogate as well as nursing, discussions with consultants, evaluation of patient's response to treatment, examination of patient, obtaining history from patient or surrogate, ordering and performing  treatments and interventions, ordering and review of laboratory studies, ordering and review of radiographic studies, pulse oximetry and re-evaluation of patient's condition.  Olivia Pavelko [facility] Pager Number [phone]

## 2020-05-14 NOTE — Progress Notes (Signed)
PROGRESS NOTE  Donna Short EQA:[id number] DOB: 01-11-1991 DOA: 05/10/2020 PCP: Orland Mustard, MD   LOS: 3 days   Brief Narrative / Interim history: 29 year old female with history of HTN, mild intermittent asthma, seizure disorder, alcohol abuse who came into the hospital with abdominal pain, nausea and vomiting.  She started feeling bad 1 to 2 days prior to admission.  She has a history of chronic pancreatitis due to EtOH, and continues to drink daily.  She also complained of nausea and vomiting however without blood in her emesis.  Hospital course complicated by 2 breakthrough seizures on 9/18 and 9/19 in the setting of alcohol withdrawal as well as poorly controlled hypertension requiring consistent up titration of her regimen.  Given persistent encephalopathy an MRI of the brain was ordered on 9/19, there were difficulties in obtaining due to agitation, eventually was done on 9/20 in the morning which showed changes concerning for pres.  She was placed on clevidipine infusion and transferred to the ICU  Subjective / 24h Interval events: Remains sleepy this morning, but wakes up and can talk but is confused.  Denies specific complaints  Assessment & Plan: Principal Problem Acute on chronic pancreatitis  -Patient was admitted to the hospital with acute on chronic pancreatitis with mildly elevated lipase and CT scan of the abdomen and pelvis consistent with acute on chronic pancreatitis -She was made initially n.p.o., was placed on IV fluids and supportive treatment  Active Problems Malignant hypertension with pres-she is known hypertensive with home medication of lisinopril and metoprolol, unclear whether she is taking them at home.  She has been persistently hypertensive, and in addition to her lisinopril and metoprolol hydralazine has been added along with IV as needed's, but due to persistent encephalopathy underwent an MRI which eventually showed changes consistent with press. Pres  can also be induced by her seizures she will be placed on clevidipine and transferred to the ICU this morning  EtOH with alcohol withdrawal and history of seizures -In the setting of alcohol withdrawal patient had a breakthrough seizure 9/18 in the morning and a recurrent one on 9/19. Case was discussed with Dr. Amada Jupiter with neurology who recommended increase Keppra to 1 g twice daily.  An MRI was obtained which showed changes as above of press  Acute kidney injury-continue fluids, monitor renal function, baseline creatinine 0.7-0.9, on admission was 1.4.  Creatinine currently has normalized  Sinus tachycardia-likely reactive due to pancreatitis. Continue fluids  Hypomagnesemia, hypophosphatemia - replete and monitor, due to ETOH.   Hyperkalemia - due to AKI, improved with fluids  Scheduled Meds:  enoxaparin (LOVENOX) injection  40 mg Subcutaneous QHS   folic acid  1 mg Oral Daily   hydrALAZINE  25 mg Oral Q8H   labetalol  200 mg Oral TID   levETIRAcetam  1,000 mg Oral BID   lisinopril  20 mg Oral Daily   LORazepam  0-4 mg Intravenous Q12H   LORazepam  2 mg Intravenous Once   multivitamin with minerals  1 tablet Oral Daily   thiamine  100 mg Oral Daily   Or   thiamine  100 mg Intravenous Daily   Continuous Infusions:  clevidipine     lactated ringers 100 mL/hr at 05/13/20 1405   PRN Meds:.acetaminophen **OR** acetaminophen, albuterol, HYDROmorphone (DILAUDID) injection, labetalol, ondansetron **OR** ondansetron (ZOFRAN) IV  Diet Orders (From admission, onward)    Start     Ordered   05/11/20 1751  Diet clear liquid Room service appropriate? Yes; Fluid consistency:  Thin  Diet effective now       Question Answer Comment  Room service appropriate? Yes   Fluid consistency: Thin      05/11/20 1750          DVT prophylaxis: enoxaparin (LOVENOX) injection 40 mg Start: 05/11/20 0015     Code Status: Full Code  Family Communication: No family present at  bedside  Status is: Inpatient  Remains inpatient appropriate because:IV treatments appropriate due to intensity of illness or inability to take PO   Dispo: The patient is from: Home              Anticipated d/c is to: Home              Anticipated d/c date is: 3 days              Patient currently is not medically stable to d/c.  Consultants:  Critical care Neurology  Procedures:  none  Microbiology  none  Antimicrobials: none    Objective: Vitals:   05/13/20 1807 05/13/20 2200 05/14/20 0000 05/14/20 0500  BP: (!) 155/119 (!) 170/119 (!) 158/124 (!) 169/120  Pulse:  100 100 75  Resp: 20 20 19 19   Temp:  98.5 F (36.9 C) 99.8 F (37.7 C) 98.7 F (37.1 C)  TempSrc:  Oral Oral Oral  SpO2: 100%   99%  Weight:      Height:        Intake/Output Summary (Last 24 hours) at 05/14/2020 05/16/2020 Last data filed at 05/14/2020 0600 Gross per 24 hour  Intake 470 ml  Output --  Net 470 ml   Filed Weights   05/10/20 1131  Weight: 58.1 kg    Examination:  Constitutional: Sleepy, no apparent distress Eyes: No scleral icterus ENMT: Moist mucous membranes Neck: normal, supple Respiratory: Lungs are clear to auscultation, no wheezing heard Cardiovascular: Regular rate and rhythm, no murmurs appreciated Abdomen: No tenderness, no guarding or rebound, bowel sounds positive Musculoskeletal: no clubbing / cyanosis.  Skin: No rashes seen Neurologic: Nonfocal, does not fully participate with neuro exam  Data Reviewed: I have independently reviewed following labs and imaging studies   CBC: Recent Labs  Lab 05/10/20 1203 05/12/20 0331 05/14/20 0134  WBC 13.1* 10.0 7.8  HGB 13.9 14.9 12.1  HCT 44.4 42.7 35.7*  MCV 112.1* 97.9 99.4  PLT 200 113* 60*   Basic Metabolic Panel: Recent Labs  Lab 05/10/20 1203 05/11/20 0410 05/12/20 0331 05/14/20 0134  NA 138 135 131* 134*  K 5.6* 4.5 3.9 2.9*  CL 96* 100 95* 96*  CO2 9* 19* 20* 22  GLUCOSE 175* 95 149* 99  BUN 25*  19 11 13   CREATININE 1.39* 1.19*   1.22* 0.92 0.76  CALCIUM 9.6 8.3* 9.3 9.1  MG  --  1.6*  --  1.5*  PHOS  --  1.8*  --  3.2   Liver Function Tests: Recent Labs  Lab 05/10/20 1203 05/11/20 0410 05/12/20 0331 05/14/20 0134  AST 126* 69* 58* 44*  ALT 61* 42 40 28  ALKPHOS 57 45 44 31*  BILITOT 0.9 1.0 1.0 1.3*  PROT 9.3* 7.7 7.9 6.7  ALBUMIN 4.8 3.9 3.9 3.1*   Coagulation Profile: No results for input(s): INR, PROTIME in the last 168 hours. HbA1C: No results for input(s): HGBA1C in the last 72 hours. CBG: No results for input(s): GLUCAP in the last 168 hours.  Recent Results (from the past 240 hour(s))  SARS Coronavirus 2 by  RT PCR (hospital order, performed in [facility] hospital lab) Nasopharyngeal Nasopharyngeal Swab     Status: None   Collection Time: 05/10/20 11:54 AM   Specimen: Nasopharyngeal Swab  Result Value Ref Range Status   SARS Coronavirus 2 NEGATIVE NEGATIVE Final    Comment: (NOTE) SARS-CoV-2 target nucleic acids are NOT DETECTED.  The SARS-CoV-2 RNA is generally detectable in upper and lower respiratory specimens during the acute phase of infection. The lowest concentration of SARS-CoV-2 viral copies this assay can detect is 250 copies / mL. A negative result does not preclude SARS-CoV-2 infection and should not be used as the sole basis for treatment or other patient management decisions.  A negative result may occur with improper specimen collection / handling, submission of specimen other than nasopharyngeal swab, presence of viral mutation(s) within the areas targeted by this assay, and inadequate number of viral copies (<250 copies / mL). A negative result must be combined with clinical observations, patient history, and epidemiological information.  Fact Sheet for Patients:   BoilerBrush.com.cy  Fact Sheet for Healthcare Providers: https://pope.com/  This test is not yet approved or  cleared by  the [place] FDA and has been authorized for detection and/or diagnosis of SARS-CoV-2 by FDA under an Emergency Use Authorization (EUA).  This EUA will remain in effect (meaning this test can be used) for the duration of the COVID-19 declaration under Section 564(b)(1) of the Act, 21 U.S.C. section 360bbb-3(b)(1), unless the authorization is terminated or revoked sooner.  Performed at [facility], [address], [place], [place] [address]      Radiology Studies: MR BRAIN WO CONTRAST  Result Date: 05/14/2020 CLINICAL DATA:  Delirium. Seizures. Alcohol abuse with abdominal pain, nausea, and vomiting. Hypertension EXAM: MRI HEAD WITHOUT CONTRAST TECHNIQUE: Multiplanar, multiecho pulse sequences of the brain and surrounding structures were obtained without intravenous contrast. COMPARISON:  09/01/2019 FINDINGS: Brain: Patchy cortical and subcortical T2 hyperintensity in the bilateral frontal, parietal, and occipital lobes-with swelling. There is involvement the right more than left cerebellum. There is fairly symmetric involvement in the posterior circulation and watershed distributions, most consistent with posterior reversible encephalopathy syndrome. The same pattern findings were seen on comparison brain MRI, although more extensive today. There has been progression to patchy restricted diffusion along the bilateral frontal, left parietal, and left parietooccipital cortex. Restricted diffusion also seen in the medial left thalamus, the latter likely seizure related. Gradient imaging was not acquired. No hematoma is seen. No hydrocephalus or collection. Vermian atrophy for age, likely related to history of alcohol abuse Vascular: Preserved flow voids, including superior sagittal sinus. Skull and upper cervical spine: Is normal marrow signal Sinuses/Orbits: Negative Other: Truncated motion degraded study. Coronal T2, axial T1, and gradient imaging was not acquired. A call has been placed  to the ordering provider. IMPRESSION: 1. Patchy, extensive brain edema in a pattern consistent with posterior reversible encephalopathy syndrome. 2. Medial left thalamic signal abnormality correlating with seizure history. Areas of altered diffusion along the bilateral cerebral cortex could be from seizure or ischemia. 3. Truncated study. Electronically Signed   By: Marnee Spring M.D.   On: 05/14/2020 04:49    Pamella Pert, MD, PhD [facility]  Between 7 am - 7 pm I am available, please contact me via Amion or Securechat  Between 7 pm - 7 am I am not available, please contact night coverage MD/APP via Amion

## 2020-05-14 NOTE — Procedures (Signed)
Patient Name: Donna Short  MRN: [id number]  Epilepsy Attending: [name]  Referring Physician/Provider: Dr Georgiana Spinner Aroor Date: 05/14/2020  Duration: 24.25 mins  Patient history: 29 year old female with history of alcohol abuse admitted with acute pancreatitis with hospital admission complicated with significantly elevated blood pressure, withdrawal, seizure and worsening mental status. EEG to evaluate for seizure.  Level of alertness: Awake/ lethargic  AEDs during EEG study: LEV  Technical aspects: This EEG study was done with scalp electrodes positioned according to the 10-20 International system of electrode placement. Electrical activity was acquired at a sampling rate of 500Hz  and reviewed with a high frequency filter of 70Hz  and a low frequency filter of 1Hz . EEG data were recorded continuously and digitally stored.   Description: No posterior dominant rhythm was seen. EEG showed spikes and polyspikes in left posterior temporal region, maximal P7 at times periodic at 0.5-1Hz  and rhythmic. There were also brief 1 seconds periods of eeg attenuation after a run of polyspikes. EEG also showed continuous  generalized ad lateralized polymorphic 3 to 6 Hz theta-delta slowing. Hyperventilation and photic stimulation were not performed.     ABNORMALITY - Periodic epileptiform discharges plus, left posterior temporal region -Continued slow, generalized and lateralized left  IMPRESSION: This study showed evidence of epileptogenicity arising from left posterior temporal region as well as cortical dysfunction in left hemisphere due to underlying structural abnormality, PRES. The periodic epileptiform discharges are on the ictal-interictal continuum. The morphology of the periodic discharges as well as rhythmic nature is suggestive of high potential for seizure ( ictal). Additionally, there is evidence of moderate diffuse encephalopathy, nonspecific etiology but likely related to  seizure.  Rashawnda Gaba 

## 2020-05-14 NOTE — Progress Notes (Signed)
LTM EEG hooked up and running - no initial skin breakdown - push button tested - neuro notified.  Same leads used.  

## 2020-05-14 NOTE — Progress Notes (Signed)
STAT EEG complete - results pending. ? ?

## 2020-05-15 ENCOUNTER — Inpatient Hospital Stay (HOSPITAL_COMMUNITY): Payer: Managed Care, Other (non HMO)

## 2020-05-15 DIAGNOSIS — G40901 Epilepsy, unspecified, not intractable, with status epilepticus: Secondary | ICD-10-CM

## 2020-05-15 DIAGNOSIS — G9340 Encephalopathy, unspecified: Secondary | ICD-10-CM

## 2020-05-15 DIAGNOSIS — K8521 Alcohol induced acute pancreatitis with uninfected necrosis: Secondary | ICD-10-CM

## 2020-05-15 LAB — COMPREHENSIVE METABOLIC PANEL
ALT: 31 U/L (ref 0–44)
AST: 49 U/L — ABNORMAL HIGH (ref 15–41)
Albumin: 3.5 g/dL (ref 3.5–5.0)
Alkaline Phosphatase: 33 U/L — ABNORMAL LOW (ref 38–126)
Anion gap: 15 (ref 5–15)
BUN: 11 mg/dL (ref 6–20)
CO2: 22 mmol/L (ref 22–32)
Calcium: 9.6 mg/dL (ref 8.9–10.3)
Chloride: 97 mmol/L — ABNORMAL LOW (ref 98–111)
Creatinine, Ser: 0.66 mg/dL (ref 0.44–1.00)
GFR calc Af Amer: >60 mL/min (ref >60)
GFR calc non Af Amer: >60 mL/min (ref >60)
Glucose, Bld: 107 mg/dL — ABNORMAL HIGH (ref 70–99)
Potassium: 3.6 mmol/L (ref 3.5–5.1)
Sodium: 134 mmol/L — ABNORMAL LOW (ref 135–145)
Total Bilirubin: 0.5 mg/dL (ref 0.3–1.2)
Total Protein: 7 g/dL (ref 6.5–8.1)

## 2020-05-15 LAB — POCT I-STAT 7, (LYTES, BLD GAS, ICA,H+H)
Acid-Base Excess: 6 mmol/L — ABNORMAL HIGH (ref 0.0–2.0)
Bicarbonate: 29.7 mmol/L — ABNORMAL HIGH (ref 20.0–28.0)
Calcium, Ion: 1.22 mmol/L (ref 1.15–1.40)
HCT: 33 % — ABNORMAL LOW (ref 36.0–46.0)
Hemoglobin: 11.2 g/dL — ABNORMAL LOW (ref 12.0–15.0)
O2 Saturation: 100 %
Patient temperature: 94
Potassium: 3.4 mmol/L — ABNORMAL LOW (ref 3.5–5.1)
Sodium: 138 mmol/L (ref 135–145)
TCO2: 31 mmol/L (ref 22–32)
pCO2 arterial: 34.6 mmHg (ref 32.0–48.0)
pH, Arterial: 7.533 — ABNORMAL HIGH (ref 7.350–7.450)
pO2, Arterial: 589 mmHg — ABNORMAL HIGH (ref 83.0–108.0)

## 2020-05-15 LAB — CBC
HCT: 35.6 % — ABNORMAL LOW (ref 36.0–46.0)
Hemoglobin: 12.5 g/dL (ref 12.0–15.0)
MCH: 34.3 pg — ABNORMAL HIGH (ref 26.0–34.0)
MCHC: 35.1 g/dL (ref 30.0–36.0)
MCV: 97.8 fL (ref 80.0–100.0)
Platelets: 80 10*3/uL — ABNORMAL LOW (ref 150–400)
RBC: 3.64 MIL/uL — ABNORMAL LOW (ref 3.87–5.11)
RDW: 10.7 % — ABNORMAL LOW (ref 11.5–15.5)
WBC: 8.9 10*3/uL (ref 4.0–10.5)
nRBC: 0 % (ref 0.0–0.2)

## 2020-05-15 LAB — GLUCOSE, CAPILLARY
Glucose-Capillary: 104 mg/dL — ABNORMAL HIGH (ref 70–99)
Glucose-Capillary: 106 mg/dL — ABNORMAL HIGH (ref 70–99)
Glucose-Capillary: 124 mg/dL — ABNORMAL HIGH (ref 70–99)
Glucose-Capillary: 184 mg/dL — ABNORMAL HIGH (ref 70–99)
Glucose-Capillary: 197 mg/dL — ABNORMAL HIGH (ref 70–99)
Glucose-Capillary: 40 mg/dL — CL (ref 70–99)
Glucose-Capillary: 61 mg/dL — ABNORMAL LOW (ref 70–99)
Glucose-Capillary: 72 mg/dL (ref 70–99)
Glucose-Capillary: 79 mg/dL (ref 70–99)
Glucose-Capillary: 93 mg/dL (ref 70–99)
Glucose-Capillary: 95 mg/dL (ref 70–99)

## 2020-05-15 LAB — PHENYTOIN LEVEL, TOTAL: Phenytoin Lvl: 22.7 ug/mL — ABNORMAL HIGH (ref 10.0–20.0)

## 2020-05-15 LAB — TRIGLYCERIDES
Triglycerides: 189 mg/dL — ABNORMAL HIGH (ref <150)
Triglycerides: 120 mg/dL (ref <150)

## 2020-05-15 LAB — LACTIC ACID, PLASMA: Lactic Acid, Venous: 3.6 mmol/L (ref 0.5–1.9)

## 2020-05-15 MED ORDER — FENTANYL CITRATE (PF) 100 MCG/2ML IJ SOLN
50.0000 ug | INTRAMUSCULAR | Status: AC | PRN
  Administered 2020-05-21 – 2020-05-22 (×3): 50 ug via INTRAVENOUS
  Filled 2020-05-15 (×3): qty 2

## 2020-05-15 MED ORDER — CLOBAZAM 10 MG PO TABS
5.0000 mg | ORAL_TABLET | Freq: Two times a day (BID) | ORAL | Status: DC
  Administered 2020-05-15 – 2020-05-19 (×8): 5 mg
  Filled 2020-05-15 (×8): qty 1

## 2020-05-15 MED ORDER — FENTANYL CITRATE (PF) 100 MCG/2ML IJ SOLN: 100.0000 ug | Freq: Once | INTRAMUSCULAR | Status: AC

## 2020-05-15 MED ORDER — PHENYTOIN SODIUM 50 MG/ML IJ SOLN
100.0000 mg | Freq: Three times a day (TID) | INTRAMUSCULAR | Status: DC
  Administered 2020-05-15 – 2020-05-18 (×8): 100 mg via INTRAVENOUS
  Filled 2020-05-15 (×10): qty 2

## 2020-05-15 MED ORDER — PROPOFOL 1000 MG/100ML IV EMUL
INTRAVENOUS | Status: AC
  Administered 2020-05-15: 20 ug/kg/min via INTRAVENOUS
  Filled 2020-05-15: qty 100

## 2020-05-15 MED ORDER — MIDAZOLAM HCL 2 MG/2ML IJ SOLN
2.0000 mg | INTRAMUSCULAR | Status: DC | PRN
  Administered 2020-05-15: 2 mg via INTRAVENOUS
  Filled 2020-05-15: qty 2

## 2020-05-15 MED ORDER — POLYETHYLENE GLYCOL 3350 17 G PO PACK
17.0000 g | PACK | Freq: Every day | ORAL | Status: DC
  Administered 2020-05-16: 17 g via ORAL
  Filled 2020-05-15: qty 1

## 2020-05-15 MED ORDER — DEXMEDETOMIDINE HCL IN NACL 400 MCG/100ML IV SOLN
0.4000 ug/kg/h | INTRAVENOUS | Status: DC
  Administered 2020-05-15: 0.6 ug/kg/h via INTRAVENOUS
  Filled 2020-05-15: qty 100

## 2020-05-15 MED ORDER — DEXTROSE 50 % IV SOLN
INTRAVENOUS | Status: AC
  Filled 2020-05-15: qty 50

## 2020-05-15 MED ORDER — BOOST / RESOURCE BREEZE PO LIQD CUSTOM
1.0000 | Freq: Three times a day (TID) | ORAL | Status: DC
  Administered 2020-05-15 (×2): 1 via ORAL

## 2020-05-15 MED ORDER — MIDAZOLAM HCL 2 MG/2ML IJ SOLN
2.0000 mg | INTRAMUSCULAR | Status: DC | PRN
  Administered 2020-05-22 (×2): 2 mg via INTRAVENOUS

## 2020-05-15 MED ORDER — LEVETIRACETAM IN NACL 1000 MG/100ML IV SOLN
1000.0000 mg | Freq: Two times a day (BID) | INTRAVENOUS | Status: DC
  Administered 2020-05-15 – 2020-05-19 (×8): 1000 mg via INTRAVENOUS
  Filled 2020-05-15 (×8): qty 100

## 2020-05-15 MED ORDER — FENTANYL CITRATE (PF) 100 MCG/2ML IJ SOLN: 50.0000 ug | INTRAMUSCULAR | Status: DC | PRN

## 2020-05-15 MED ORDER — PROPOFOL 10 MG/ML IV BOLUS: 1.0000 mg/kg | Freq: Once | INTRAVENOUS | Status: DC

## 2020-05-15 MED ORDER — DEXTROSE-NACL 5-0.9 % IV SOLN: INTRAVENOUS | Status: DC

## 2020-05-15 MED ORDER — LABETALOL HCL 200 MG PO TABS
200.0000 mg | ORAL_TABLET | Freq: Three times a day (TID) | ORAL | Status: DC
  Administered 2020-05-15: 200 mg
  Filled 2020-05-15 (×2): qty 1

## 2020-05-15 MED ORDER — ETOMIDATE 2 MG/ML IV SOLN
INTRAVENOUS | Status: AC
  Administered 2020-05-15: 20 mg via INTRAVENOUS
  Filled 2020-05-15: qty 20

## 2020-05-15 MED ORDER — POTASSIUM CHLORIDE 10 MEQ/50ML IV SOLN: 10.0000 meq | INTRAVENOUS | Status: DC

## 2020-05-15 MED ORDER — DEXTROSE-NACL 5-0.45 % IV SOLN: INTRAVENOUS | Status: DC

## 2020-05-15 MED ORDER — SODIUM CHLORIDE 0.9 % IV SOLN
20.0000 mg/kg | Freq: Once | INTRAVENOUS | Status: AC
  Administered 2020-05-15: 1162 mg via INTRAVENOUS
  Filled 2020-05-15: qty 23.24

## 2020-05-15 MED ORDER — ROCURONIUM BROMIDE 10 MG/ML (PF) SYRINGE
PREFILLED_SYRINGE | INTRAVENOUS | Status: AC
  Administered 2020-05-15: 60 mg
  Filled 2020-05-15: qty 10

## 2020-05-15 MED ORDER — ROCURONIUM BROMIDE 50 MG/5ML IV SOLN
60.0000 mg | Freq: Once | INTRAVENOUS | Status: AC
  Filled 2020-05-15: qty 6

## 2020-05-15 MED ORDER — MIDAZOLAM HCL 2 MG/2ML IJ SOLN
2.0000 mg | Freq: Once | INTRAMUSCULAR | Status: AC
  Administered 2020-05-15: 2 mg via INTRAVENOUS

## 2020-05-15 MED ORDER — ETOMIDATE 2 MG/ML IV SOLN: 20.0000 mg | Freq: Once | INTRAVENOUS | Status: AC

## 2020-05-15 MED ORDER — MIDAZOLAM HCL 2 MG/2ML IJ SOLN
1.0000 mg | INTRAMUSCULAR | Status: DC | PRN
  Administered 2020-05-15: 4 mg via INTRAVENOUS
  Administered 2020-05-15 (×2): 2 mg via INTRAVENOUS
  Administered 2020-05-16 – 2020-05-21 (×5): 4 mg via INTRAVENOUS
  Administered 2020-05-22 – 2020-05-24 (×5): 2 mg via INTRAVENOUS
  Administered 2020-05-24 – 2020-05-25 (×4): 4 mg via INTRAVENOUS
  Filled 2020-05-15: qty 4
  Filled 2020-05-15 (×2): qty 2
  Filled 2020-05-15: qty 4
  Filled 2020-05-15: qty 2
  Filled 2020-05-15 (×3): qty 4
  Filled 2020-05-15: qty 2
  Filled 2020-05-15 (×2): qty 4
  Filled 2020-05-15: qty 2
  Filled 2020-05-15 (×2): qty 4
  Filled 2020-05-15 (×2): qty 2

## 2020-05-15 MED ORDER — PROPOFOL 1000 MG/100ML IV EMUL
5.0000 ug/kg/min | INTRAVENOUS | Status: DC
  Administered 2020-05-15 – 2020-05-18 (×15): 80 ug/kg/min via INTRAVENOUS
  Administered 2020-05-18 (×3): 90 ug/kg/min via INTRAVENOUS
  Administered 2020-05-18: 80 ug/kg/min via INTRAVENOUS
  Filled 2020-05-15 (×6): qty 100
  Filled 2020-05-15: qty 200
  Filled 2020-05-15 (×12): qty 100

## 2020-05-15 MED ORDER — HYDRALAZINE HCL 50 MG PO TABS
25.0000 mg | ORAL_TABLET | Freq: Three times a day (TID) | ORAL | Status: DC
  Administered 2020-05-15: 25 mg
  Filled 2020-05-15 (×2): qty 1

## 2020-05-15 MED ORDER — MIDAZOLAM HCL 2 MG/2ML IJ SOLN
INTRAMUSCULAR | Status: AC
  Administered 2020-05-15: 2 mg via INTRAVENOUS
  Filled 2020-05-15: qty 2

## 2020-05-15 MED ORDER — SODIUM CHLORIDE 0.9 % IV SOLN: INTRAVENOUS | Status: DC

## 2020-05-15 MED ORDER — AMLODIPINE BESYLATE 10 MG PO TABS
10.0000 mg | ORAL_TABLET | Freq: Every day | ORAL | Status: DC
  Administered 2020-05-15: 10 mg via ORAL
  Filled 2020-05-15: qty 1

## 2020-05-15 MED ORDER — AMLODIPINE BESYLATE 10 MG PO TABS: 10.0000 mg | ORAL_TABLET | Freq: Every day | ORAL | Status: DC

## 2020-05-15 MED ORDER — POTASSIUM CHLORIDE 10 MEQ/100ML IV SOLN
10.0000 meq | INTRAVENOUS | Status: AC
  Administered 2020-05-15 (×2): 10 meq via INTRAVENOUS
  Filled 2020-05-15 (×2): qty 100

## 2020-05-15 MED ORDER — FENTANYL CITRATE (PF) 100 MCG/2ML IJ SOLN
INTRAMUSCULAR | Status: AC
  Administered 2020-05-15: 100 ug via INTRAVENOUS
  Filled 2020-05-15: qty 2

## 2020-05-15 MED ORDER — DOCUSATE SODIUM 50 MG/5ML PO LIQD
100.0000 mg | Freq: Two times a day (BID) | ORAL | Status: DC
  Administered 2020-05-15 – 2020-05-16 (×2): 100 mg via ORAL
  Filled 2020-05-15 (×2): qty 10

## 2020-05-15 MED ORDER — ADULT MULTIVITAMIN W/MINERALS CH
1.0000 | ORAL_TABLET | Freq: Every day | ORAL | Status: DC
  Administered 2020-05-16 – 2020-05-31 (×15): 1
  Filled 2020-05-15 (×15): qty 1

## 2020-05-15 NOTE — Progress Notes (Signed)
Initial Nutrition Assessment  DOCUMENTATION CODES:   Not applicable  INTERVENTION:   Boost Breeze po TID, each supplement provides 250 kcal and 9 grams of protein  Continue MVI with minerals daily  NUTRITION DIAGNOSIS:   Inadequate oral intake related to lethargy/confusion as evidenced by meal completion < 25%.  GOAL:   Patient will meet greater than or equal to 90% of their needs  MONITOR:   PO intake, Supplement acceptance, Diet advancement, Labs  REASON FOR ASSESSMENT:   Rounds    ASSESSMENT:   29 yo female admitted with acute on chronic pancreatitis and alcohol withdrawal. PMH includes alcohol abuse, HTN.   Discussed patient in ICU rounds and with RN today. Since admission, patient has developed uncontrolled HTN and seizures. MRI c/w PRES.  Patient is eating poorly. She has been either NPO or on clear liquids since admission. Intake of meals is minimal due to agitation and AMS. CBGs have been running low. RD to add Boost Breeze PO supplement to help improve intake.  Patient is receiving Cleviprex to manage blood pressure.   Labs reviewed. Trig 189, Sodium 134  CBG: [phone]  Medications reviewed and include folic acid, Keppra, MVI with minerals, thiamine, precedex, KCl.  Cleviprex: 274.1 ml over the past 24 hr = 548 kcal from lipid.   Usual weights reviewed. Patient has had no significant weight changes over the past year.  NUTRITION - FOCUSED PHYSICAL EXAM:    Most Recent Value  Orbital Region No depletion  Upper Arm Region No depletion  Thoracic and Lumbar Region No depletion  Buccal Region No depletion  Temple Region No depletion  Clavicle Bone Region No depletion  Clavicle and Acromion Bone Region No depletion  Scapular Bone Region No depletion  Dorsal Hand No depletion  Patellar Region No depletion  Anterior Thigh Region No depletion  Posterior Calf Region Mild depletion  Edema (RD Assessment) None  Hair Reviewed  Eyes Unable to assess   Mouth Unable to assess  Skin Reviewed  Nails Reviewed       Diet Order:   Diet Order            Diet clear liquid Room service appropriate? Yes; Fluid consistency: Thin  Diet effective now                 EDUCATION NEEDS:   Not appropriate for education at this time  Skin:  Skin Assessment: Reviewed RN Assessment  Last BM:  9/18 smear  Height:   Ht Readings from Last 1 Encounters:  05/10/20 5\' 2"  (1.575 m)    Weight:   Wt Readings from Last 1 Encounters:  05/10/20 58.1 kg    Ideal Body Weight:  50 kg  BMI:  Body mass index is 23.41 kg/m.  Estimated Nutritional Needs:   Kcal:  1700-1900  Protein:  85-100 gm  Fluid:  >/= 1.8 L    05/12/20, RD, LDN, CNSC Please refer to Amion for contact information.

## 2020-05-15 NOTE — Progress Notes (Signed)
I returned from lunch with patient very agitated and patient had removed PIV. Received order from Dr. Marchelle Gearing for 1-4 mg Versed Q2 PRN.   2 mg Versed given with somewhat effectiveness. Patient is still very awake and fidgety. Will continue to monitor.   Darrold Span, RN

## 2020-05-15 NOTE — Progress Notes (Signed)
eLink Physician-Brief Progress Note Patient Name: DELORIS MOGER DOB: 28-Dec-1990 MRN: [id number]   Date of Service  05/15/2020  HPI/Events of Note  Notified of hypothermia and lactic acid 3.6 CXR post intubation without intrathoracic process  eICU Interventions  Blood cultures and urinalysis ordered     Intervention Category Major Interventions: Other:  Darl Pikes 05/15/2020, 11:13 PM

## 2020-05-15 NOTE — Progress Notes (Addendum)
Hypoglycemic Event  CBG: 40  Treatment: 25mg  of Dextrose  Symptoms: Cold extremities, Temp: 94 F  Follow-up CBG: Time:2330 CBG Result:184  Comments/MD notified: 

## 2020-05-15 NOTE — Progress Notes (Signed)
eLink Physician-Brief Progress Note Patient Name: Donna Short DOB: 05-10-1991 MRN: [id number]   Date of Service  05/15/2020  HPI/Events of Note  Patient now intubated Request to shift PO meds to per tube  eICU Interventions  Shift PO meds to per tube Shifted Keppra to IV     Intervention Category Minor Interventions: Other:  Darl Pikes 05/15/2020, 8:05 PM

## 2020-05-15 NOTE — Progress Notes (Signed)
Intubated under requiest by neurology  Plan PRVC Diprivan gtt Cleviprex gtt fent prn Versed prn  DC lisinopril    RASS goal -3 MAP goal 80-90      SIGNATURE    Dr. Kalman Shan, M.D., F.C.C.P,  Pulmonary and Critical Care Medicine Staff Physician, [facility] Center Director - Interstitial Lung Disease  Program  Pulmonary Fibrosis [facility] Network at [facility] [place], [place], [address]  Pager: [phone], If no answer  OR between  19:00-7:00h: page [phone] Telephone (clinical office): [phone] Telephone (research): [phone]  6:17 PM 05/15/2020

## 2020-05-15 NOTE — Procedures (Signed)
Intubation Procedure Note  Donna Short  [id number]  1991/01/27  Date:05/15/20  Time:6:15 PM   Provider Performing:Daylen Hack    Procedure: Intubation (31500)  Indication(s) Acute encephalopathy - called by Dr Rollene Fare of neuro - patient with worsening PRES. REquesting intubation for burst suppression  Consent Risks of the procedure as well as the alternatives and risks of each were explained to the patient and/or caregiver.  Consent for the procedure was obtained but was  A verbal informed consent from the mom.   Anesthesia Etomidate, Versed, Fentanyl and Rocuronium   Time Out Verified patient identification, verified procedure, site/side was marked, verified correct patient position, special equipment/implants available, medications/allergies/relevant history reviewed, required imaging and test results available.   Sterile Technique Usual hand hygeine, masks, and gloves were used   Procedure Description Patient positioned in bed supine.  Sedation given as noted above.  Patient was intubated with endotracheal tube using Glidescope.  View was Grade 1 full glottis .  Number of attempts was 1.  Colorimetric CO2 detector was consistent with tracheal placement.   Complications/Tolerance None; patient tolerated the procedure well. Chest X-ray is ordered to verify placement.   EBL none   Specimen(s) None   COMMENTS OG tube also placed under glidescope     SIGNATURE    Dr. Kalman Shan, M.D., F.C.C.P,  Pulmonary and Critical Care Medicine Staff Physician, [facility] Center Director - Interstitial Lung Disease  Program  Pulmonary Fibrosis [facility] Network at [facility] [place], [place], [address]  Pager: [phone], If no answer  OR between  19:00-7:00h: page [phone] Telephone (clinical office): [phone] Telephone (research): [phone]  6:16 PM 05/15/2020

## 2020-05-15 NOTE — Progress Notes (Signed)
LTM maint complete - no skin breakdown under: CZ,P3,P4

## 2020-05-15 NOTE — Progress Notes (Signed)
Hypoglycemic Event  CBG: 61   Treatment: 8 oz juice/soda  Symptoms: Shaky  Follow-up CBG: Time: 0217 CBG Result: 72  Possible Reasons for Event: Inadequate meal intake  Comments/MD notified: encouraged juice intake    Tersea Aulds, Pearla Dubonnet

## 2020-05-15 NOTE — Progress Notes (Addendum)
Neurology Progress Note  ID: 29 y.o. female with past medical history of hypertension, seizure disorder asthma, alcohol abuse, chronic pancreatitis admitted for abdominal pain, nausea vomiting due to acute pancreatitis.  Patient's admission complicated with withdrawal symptoms and seizure on the morning of 9/19.  Patient's Keppra dose was increased to 1 g twice daily, and she was transferred to the ICU for BP control with clevidipine and started on EEG monitoring. LTM demonstrated continued LPEDs, for which Onfi was added on 9/20 evening   Subjective: Patient remains confused Told mom she wants to go home, otherwise non-sensical speech   Objective:  General: Resting comfortably, EEG in place Psych: Flat affect, occasional tears   Cardiac: Regular rate and rhythm, meeting MAP 80-90 goal on monitor  Pulmonary: Breathing comfortably Abdomen: Non-distended, non-tender  Extremities: No significant swelling or deformities  Neuro:   MS and CN: Patient is sleeping but arouses to light touch.  She tracks to the examiner's voice but does not clearly see and cannot accurately report any visual stimulus; EOMI and face symmetric.  She does is able to stick out her tongue, but then also shows me her teeth and smiles, when I ask her to show fingers she shows me her whole hand and repeats "fingers." Word salad speech in response to questions. Nods yes to "is that your mother here" but not reliable on yes/no questions overall Sensory/Motor: She is moving all 4 extremities antigravity equally and equally responsive to touch in all four extremities   EEG with continued significant PLEDs   Assessment: Non-convulsive status epilepticus. Will add dilantin today. If no improvement, discussed with mother and Dr. Carmin Muskrat that burst-suppression / intubation will be appropriate for seizure control.   #PRES #Seizure disorder  -Continue Keppra 1 g twice daily -s/p Loading dose of 10 mg Onfi 9/20 PM; continue 5 mg Onfi  twice daily starting 9/21 AM -fosphenytoin load 20 mg/kg, then phenytoin 100 mg q8hr,   - will check level (post-load, just prior to 8 PM dose of phenytoin); goal 15-20 -We will continue to monitor EEG -Continue blood pressure control with a MAP goal of 80-90 -Please consider placement of arterial line for closer blood pressure monitoring -Continue to avoid hypotonic fluids; 100 cc/hr LR replaced with 100 cc/hr Normal saline -Continue every 4 hours neuro checks  #Hypoglycemia -25 cc/hr D5 normal saline to avoid additional hypoglycemic insult -Every 2 hour glucose checks to confirm blood glucose stable -Drip may be titrated as needed to maintain a normoglycemia goal per CCM  Brooke Dare MD-PhD [facility] [phone]   35 min spent in critical care of this patient today

## 2020-05-15 NOTE — Progress Notes (Signed)
NAME:  [name], MRN:  [id number], DOB:  [birth year], LOS: 4 ADMISSION DATE:  05/10/2020, CONSULTATION DATE:  05/14/20 REFERRING MD:  Loney Loh, CHIEF COMPLAINT:  Encephalopathy   Brief History   Donna Short is a 29 y.o. female with a PMHx of EtOH abuse and HTN admitted to [facility] 9/17 for acute on chronic pancreatitis and EtOH withdrawal. During her hospital stay she developed uncontrolled HTN and breakthrough seizure activity with MRI findings consistent with PRES.   Past Medical History   Past Medical History:  Diagnosis Date  . Asthma   . Depression   . History of chicken pox   . Hypertension   . Migraines   . Pancreatitis 01/2018  . Seizures (HCC)      Significant Hospital Events   9/18, 9/19 breakthrough seizures 9/20- MRI showing PRES  Consults:  Neurology- consulted 9/20, recommended maintaining strict BP control with MAP goals 80-90, Keppra 1g 2x daily for seizure control  Procedures:  none  Significant Diagnostic Tests:  Abdominal CT- findings consistent with acute on chronic pancreatitis predominantly involving the proximal pancreas. No peripancreatic fluid collections are identified. EEG- 9/20, evidence of epileptogenicity arising from left posterior temporal region as well as cortical dysfunction in left hemisphere due to underlying structural abnormality, PRES MRI- 9/20, patchy/extensive brain edema in a pattern consistent with PRES  Micro Data:  None  Antimicrobials:  None   Interim history/subjective:  Patient had a hypoglycemic episode with BG 61 and shaking overnight consistent with previous episodes past 2 days. Symptoms responded well to 8 oz of juice and BG improved to 72 within 30 minutes. Likely due to poor oral intake  Objective   Blood pressure 115/75, pulse 96, temperature 98.5 F (36.9 C), temperature source Oral, resp. rate 18, height 5\' 2"  (1.575 m), weight 58.1 kg, SpO2 100 %.        Intake/Output Summary (Last 24 hours) at  05/15/2020 0920 Last data filed at 05/15/2020 0600 Gross per 24 hour  Intake 2203.84 ml  Output 1155 ml  Net 1048.84 ml   Filed Weights   05/10/20 1131  Weight: 58.1 kg    Examination: General: No acute distress,  HENT: EOMI, no discharge, no scleral icterus Lungs: CTA bilaterally although patient does not take deep inspiration Cardiovascular: S1/S2, no m/r/g Abdomen: soft hypoactive bowel sounds,  Neuro: A&Ox0, patient disoriented to self and situation, unable to follow simple commands, patient eyes track bilaterally, she is responsive but illogical   Resolved Hospital Problem list   AKI- initially presented with Cr 1.4, baseline .7-.9, normalized following administration of IV fluids    Assessment & Plan:  PRES, Acute Encephalopathy -- MAP goals 80-90 -- Cleviprex drip -- labetalol 200 mg TID, Hydralazine 25 mg q8h, Lisinopril 20 mg qd,  -- Keppra 1g x2 daily -- q4h neuro checks   EtOH Withdrawal -- continue Ativan within CIWA  Acute on Chronic Pancreatitis -- clear thin liquid diet although patient not eating -- D5NS   Best practice:  Diet: Thin Liquids Pain/Anxiety/Delirium protocol (if indicated): Ativan VAP protocol (if indicated): N/a DVT prophylaxis: Lovenox GI prophylaxis: N/a Glucose control: N/a Mobility: bedrest Code Status: Full code Family Communication: Mother plans to come today Disposition: Continued ICU care  Labs   CBC: Recent Labs  Lab 05/10/20 1203 05/12/20 0331 05/14/20 0134 05/15/20 0123  WBC 13.1* 10.0 7.8 8.9  HGB 13.9 14.9 12.1 12.5  HCT 44.4 42.7 35.7* 35.6*  MCV 112.1* 97.9 99.4 97.8  PLT 200 113*  60* 80*    Basic Metabolic Panel: Recent Labs  Lab 05/11/20 0410 05/12/20 0331 05/14/20 0134 05/14/20 1444 05/15/20 0123  NA 135 131* 134* 133* 134*  K 4.5 3.9 2.9* 4.5 3.6  CL 100 95* 96* 95* 97*  CO2 19* 20* 22 22 22   GLUCOSE 95 149* 99 84 107*  BUN 19 11 13 13 11   CREATININE 1.19*  1.22* 0.92 0.76 0.72 0.66    CALCIUM 8.3* 9.3 9.1 9.7 9.6  MG 1.6*  --  1.5*  --   --   PHOS 1.8*  --  3.2  --   --    GFR: Estimated Creatinine Clearance: 80.6 mL/min (by C-G formula based on SCr of 0.66 mg/dL). Recent Labs  Lab 05/10/20 1203 05/10/20 1637 05/11/20 0410 05/12/20 0331 05/14/20 0134 05/15/20 0123  WBC 13.1*  --   --  10.0 7.8 8.9  LATICACIDVEN  --  2.6* 1.0  --   --   --     Liver Function Tests: Recent Labs  Lab 05/10/20 1203 05/11/20 0410 05/12/20 0331 05/14/20 0134 05/15/20 0123  AST 126* 69* 58* 44* 49*  ALT 61* 42 40 28 31  ALKPHOS 57 45 44 31* 33*  BILITOT 0.9 1.0 1.0 1.3* 0.5  PROT 9.3* 7.7 7.9 6.7 7.0  ALBUMIN 4.8 3.9 3.9 3.1* 3.5   CBG: Recent Labs  Lab 05/15/20 0146 05/15/20 0217 05/15/20 0329 05/15/20 0558 05/15/20 0754  GLUCAP 61* 72 106* 104* 79    Review of Systems:   Unable to perform given patient's altered mental status.    Social History   reports that she quit smoking about 2 years ago. Her smoking use included cigarettes. She has never used smokeless tobacco. She reports current alcohol use of about 9.0 standard drinks of alcohol per week. She reports that she does not use drugs.   Family History   Her family history includes Alcohol abuse in her father; Arthritis in her father; Asthma in her mother; Breast cancer in her mother; Diabetes in her father; Drug abuse in her mother; Hypertension in her mother.   Allergies Allergies  Allergen Reactions  . Claritin [Loratadine] Itching  . Peanut-Containing Drug Products Other (See Comments)  . Benadryl [Diphenhydramine Hcl (Sleep)] Rash  . Latex Rash  . Penicillins Itching and Rash    Has patient had a PCN reaction causing immediate rash, facial/tongue/throat swelling, SOB or lightheadedness with hypotension: Yes Has patient had a PCN reaction causing severe rash involving mucus membranes or skin necrosis: No Has patient had a PCN reaction that required hospitalization: Yes Has patient had a PCN  reaction occurring within the last 10 years: No If all of the above answers are "NO", then may proceed with Cephalosporin use.;  . Shrimp [Shellfish Allergy] Hives and Rash     Home Medications  Prior to Admission medications   Medication Sig Start Date End Date Taking? Authorizing Provider  acetaminophen (TYLENOL) 500 MG tablet Take 1,000 mg by mouth every 6 (six) hours as needed for mild pain.   Yes [provider]  albuterol (VENTOLIN HFA) 108 (90 Base) MCG/ACT inhaler Inhale 1-2 puffs into the lungs every 6 (six) hours as needed for wheezing or shortness of breath. 02/08/19  Yes Wieters, Hallie C, PA-C  FLUoxetine (PROZAC) 20 MG capsule Take 1 capsule (20 mg total) by mouth daily. 12/26/19  Yes 02/10/19, MD  folic acid (FOLVITE) 1 MG tablet Take 1 tablet (1 mg total) by mouth daily. 12/21/19  Yes Orland Mustard, MD  levETIRAcetam (KEPPRA) 500 MG tablet Take 1 tablet (500 mg total) by mouth 2 (two) times daily. 03/14/20  Yes Orland Mustard, MD  lisinopril (ZESTRIL) 20 MG tablet Take 1 tablet (20 mg total) by mouth daily. 12/26/19  Yes Orland Mustard, MD  metoprolol tartrate (LOPRESSOR) 25 MG tablet Take 1 tablet (25 mg total) by mouth 2 (two) times daily. 12/26/19  Yes Orland Mustard, MD  ibuprofen (ADVIL) 600 MG tablet Take 1 tablet (600 mg total) by mouth every 8 (eight) hours as needed. Patient not taking: Reported on 05/11/2020 08/25/19   Orland Mustard, MD     Hilario Quarry, MS4

## 2020-05-15 NOTE — Progress Notes (Signed)
    AGitated despite versed prn  Plan  - precedex gtt (d/w RN)     SIGNATURE    Dr. Kalman Shan, M.D., F.C.C.P,  Pulmonary and Critical Care Medicine Staff Physician, [facility] Center Director - Interstitial Lung Disease  Program  Pulmonary Fibrosis [facility] Network at [facility] [place], [place], [address]  Pager: [phone], If no answer  OR between  19:00-7:00h: page [phone] Telephone (clinical office): [phone] Telephone (research): [phone]  2:17 PM 05/15/2020

## 2020-05-15 NOTE — Procedures (Addendum)
Patient Name: Donna Short  MRN: [id number]  Epilepsy Attending: [name]  Referring Physician/Provider: Dr Brooke Dare Duration: 05/14/2020 1275 to 05/15/2020 0921  Patient history: 29 year old female with history of alcohol abuse admitted with acute pancreatitis with hospital admission complicated with significantly elevated blood pressure, withdrawal, seizure and worsening mental status. EEG to evaluate for seizure.  Level of alertness: Awake/ lethargic  AEDs during EEG study: LEV  Technical aspects: This EEG study was done with scalp electrodes positioned according to the 10-20 International system of electrode placement. Electrical activity was acquired at a sampling rate of 500Hz  and reviewed with a high frequency filter of 70Hz  and a low frequency filter of 1Hz . EEG data were recorded continuously and digitally stored.   Description: No posterior dominant rhythm was seen. EEG showed spikes and polyspikes in left posterior temporal region, maximal P7 at times periodic at 0.5-1Hz  and rhythmic. There were also brief 1 seconds periods of eeg attenuation after a run of polyspikes. EEG also showed continuous  generalized ad lateralized polymorphic 3 to 6 Hz theta-delta slowing. Hyperventilation and photic stimulation were not performed.     ABNORMALITY - Periodic epileptiform discharges plus, left posterior temporal region - Continued slow, generalized and lateralized left  IMPRESSION: This study showed evidence of epileptogenicity arising from left posterior temporal region as well as cortical dysfunction in left hemisphere due to underlying structural abnormality, PRES. The periodic epileptiform discharges are on the ictal-interictal continuum. The morphology of the periodic discharges as well as rhythmic nature is suggestive of high potential for seizure ( ictal). Additionally, there is evidence of moderate diffuse encephalopathy, nonspecific etiology but likely related  to seizure.  [name] 

## 2020-05-15 NOTE — Progress Notes (Signed)
ATTESTATION & SIGNATURE   STAFF NOTE: I, Dr Lavinia Sharps have personally reviewed patient's available data, including medical history, events of note, physical examination and test results as part of my evaluation. I have discussed with resident/NP and other care providers such as pharmacist, RN and RRT.  In addition,  I personally evaluated patient and elicited key findings of   S: Date of admit 05/10/2020 with LOS 4 for today 05/15/2020 : Donna Short is   - not on vent.  Remains confused. On cleviprex for MAP goal 80-90. Currently MAP 94. HYpoglycemic on D5 Saline at 25cc/h. Not able to eat well due to confusion.  Mom requesting FMLA paper work for [employer]  EEG with PRES  O:  Blood pressure 118/82, pulse (!) 109, temperature 98.5 F (36.9 C), temperature source Oral, resp. rate 13, height 5\' 2"  (1.575 m), weight 58.1 kg, SpO2 100 %.   Alert, awake but confused No distress Mutters responses CTA bilaterally Normal heart sounds ABd soft    LABS    PULMONARY No results for input(s): PHART, PCO2ART, PO2ART, HCO3, TCO2, O2SAT in the last 168 hours.  Invalid input(s): PCO2, PO2  CBC Recent Labs  Lab 05/12/20 0331 05/14/20 0134 05/15/20 0123  HGB 14.9 12.1 12.5  HCT 42.7 35.7* 35.6*  WBC 10.0 7.8 8.9  PLT 113* 60* 80*    COAGULATION No results for input(s): INR in the last 168 hours.  CARDIAC  No results for input(s): TROPONINI in the last 168 hours. No results for input(s): PROBNP in the last 168 hours.   CHEMISTRY Recent Labs  Lab 05/11/20 0410 05/11/20 0410 05/12/20 0331 05/12/20 0331 05/14/20 0134 05/14/20 0134 05/14/20 1444 05/15/20 0123  NA 135  --  131*  --  134*  --  133* 134*  K 4.5   < > 3.9   < > 2.9*   < > 4.5 3.6  CL 100  --  95*  --  96*  --  95* 97*  CO2 19*  --  20*  --  22  --  22 22  GLUCOSE 95  --  149*  --  99  --  84 107*  BUN 19  --  11  --  13  --  13 11  CREATININE 1.19*  1.22*  --  0.92  --  0.76  --  0.72 0.66   CALCIUM 8.3*  --  9.3  --  9.1  --  9.7 9.6  MG 1.6*  --   --   --  1.5*  --   --   --   PHOS 1.8*  --   --   --  3.2  --   --   --    < > = values in this interval not displayed.   Estimated Creatinine Clearance: 80.6 mL/min (by C-G formula based on SCr of 0.66 mg/dL).   LIVER Recent Labs  Lab 05/10/20 1203 05/11/20 0410 05/12/20 0331 05/14/20 0134 05/15/20 0123  AST 126* 69* 58* 44* 49*  ALT 61* 42 40 28 31  ALKPHOS 57 45 44 31* 33*  BILITOT 0.9 1.0 1.0 1.3* 0.5  PROT 9.3* 7.7 7.9 6.7 7.0  ALBUMIN 4.8 3.9 3.9 3.1* 3.5     INFECTIOUS Recent Labs  Lab 05/10/20 1637 05/11/20 0410  LATICACIDVEN 2.6* 1.0     ENDOCRINE CBG (last 3)  Recent Labs    05/15/20 0329 05/15/20 0558 05/15/20 0754  GLUCAP 106* 104* 79  IMAGING x24h  - image(s) personally visualized  -   highlighted in bold EEG  Result Date: 05/14/2020 Donna Quest, MD     05/14/2020 10:52 AM Patient Name: Donna Short MRN: [id number] Epilepsy Attending: Charlsie Short Referring Physician/Provider: Dr Georgiana Spinner Aroor Date: 05/14/2020 Duration: 24.25 mins Patient history: 29 year old female with history of alcohol abuse admitted with acute pancreatitis with hospital admission complicated with significantly elevated blood pressure, withdrawal, seizure and worsening mental status. EEG to evaluate for seizure. Level of alertness: Awake/ lethargic AEDs during EEG study: LEV Technical aspects: This EEG study was done with scalp electrodes positioned according to the 10-20 International system of electrode placement. Electrical activity was acquired at a sampling rate of 500Hz  and reviewed with a high frequency filter of 70Hz  and a low frequency filter of 1Hz . EEG data were recorded continuously and digitally stored. Description: No posterior dominant rhythm was seen. EEG showed spikes and polyspikes in left posterior temporal region, maximal P7 at times periodic at 0.5-1Hz  and rhythmic. There were  also brief 1 seconds periods of eeg attenuation after a run of polyspikes. EEG also showed continuous  generalized ad lateralized polymorphic 3 to 6 Hz theta-delta slowing. Hyperventilation and photic stimulation were not performed.   ABNORMALITY - Periodic epileptiform discharges plus, left posterior temporal region -Continued slow, generalized and lateralized left IMPRESSION: This study showed evidence of epileptogenicity arising from left posterior temporal region as well as cortical dysfunction in left hemisphere due to underlying structural abnormality, PRES. The periodic epileptiform discharges are on the ictal-interictal continuum. The morphology of the periodic discharges as well as rhythmic nature is suggestive of high potential for seizure ( ictal). Additionally, there is evidence of moderate diffuse encephalopathy, nonspecific etiology but likely related to seizure. Priyanka   Overnight EEG with video  Result Date: 05/15/2020 , MD     05/15/2020  8:22 AM Patient Name: Donna Short MRN: Donna Short Epilepsy Attending: 05/17/2020 Referring Physician/Provider: Dr Donna Short Duration: 05/14/2020 0921 to 05/15/2020 0800  Patient history: 29 year old female with history of alcohol abuse admitted with acute pancreatitis with hospital admission complicated with significantly elevated blood pressure, withdrawal, seizure and worsening mental status. EEG to evaluate for seizure.  Level of alertness: Awake/ lethargic  AEDs during EEG study: LEV  Technical aspects: This EEG study was done with scalp electrodes positioned according to the 10-20 International system of electrode placement. Electrical activity was acquired at a sampling rate of 500Hz  and reviewed with a high frequency filter of 70Hz  and a low frequency filter of 1Hz . EEG data were recorded continuously and digitally stored.  Description: No posterior dominant rhythm was seen. EEG showed spikes and polyspikes in  left posterior temporal region, maximal P7 at times periodic at 0.5-1Hz  and rhythmic. There were also brief 1 seconds periods of eeg attenuation after a run of polyspikes. EEG also showed continuous  generalized ad lateralized polymorphic 3 to 6 Hz theta-delta slowing. Hyperventilation and photic stimulation were not performed.    ABNORMALITY - Periodic epileptiform discharges plus, left posterior temporal region - Continued slow, generalized and lateralized left  IMPRESSION: This study showed evidence of epileptogenicity arising from left posterior temporal region as well as cortical dysfunction in left hemisphere due to underlying structural abnormality, PRES. The periodic epileptiform discharges are on the ictal-interictal continuum. The morphology of the periodic discharges as well as rhythmic nature is suggestive of high potential for seizure ( ictal). Additionally, there is evidence of moderate diffuse  encephalopathy, nonspecific etiology but likely related to seizure.  Priyanka [name]      A: PRES with acute encephalopathy Hypertension Mild low K Hypoglycemia FMLA paper work need  P: continue cleviprex for MAP goal 80-90 Replete K Change D5 NS from 25cc /h to d5 half NS at 50cc/h FMLA paper work given to mom    Anti-infectives (From admission, onward)   None       Rest per NP/medical resident whose note is outlined above and that I agree with  The patient is critically ill with multiple organ systems failure and requires high complexity decision making for assessment and support, frequent evaluation and titration of therapies, application of advanced monitoring technologies and extensive interpretation of multiple databases.   Critical Care Time devoted to patient care services described in this note is  31  Minutes. This time reflects time of care of this signee Dr Kalman Shan. This critical care time does not reflect procedure time, or teaching time or supervisory time  of PA/NP/Med student/Med Resident etc but could involve care discussion time     Dr. Kalman Shan, M.D., [facility].C.P Pulmonary and Critical Care Medicine Staff Physician [facility] Pager: [phone], If no answer or between  15:00h - 7:00h: call [phone]  05/15/2020 10:09 AM

## 2020-05-15 NOTE — Progress Notes (Signed)
Diffuse asymmetric slowing seen on EEG overnight. No definite electrographic seizures. Official EEG report pending.   Electronically signed: Dr. Caryl Pina

## 2020-05-15 NOTE — Progress Notes (Addendum)
S/O: EEG reviewed. Continues to exhibits left sided PLEDs.   Exam:  BP 104/84   Pulse 79   Temp (!) 94 F (34.4 C) (Axillary)   Resp (!) 22   Ht 5\' 2"  (1.575 m)   Wt 58.1 kg   SpO2 97%   BMI 23.41 kg/m   Ment: Sedated and intubated CN: Face symmetric Motor: No adventitious movements noted  A/R: 29 y.o.femalewith past medical history of hypertension, seizure disorder asthma, alcohol abuse, chronic pancreatitis admitted for abdominal pain, nausea vomiting due to acute pancreatitis. Patient'sadmission complicated with withdrawal symptoms and seizure on the morning of 9/19. Patient's Keppra dose was increased to 1 g twice daily, and she was transferred to the ICU for BP control with clevidipine and started on EEG monitoring. LTM demonstrated continued LPEDs, for which Onfi was added on 9/20 evening. Dilantin was started today. She was subsequently intubated for burst suppression protocol.  1. Continue Keppra, Dilantin and Onfi. 2. Increasing propofol gtt to 80 mcg/kg/min.  Total critical care time today, combined between Dr. 10/20 assessment and my follow up this evening: 35 minutes.  .   Electronically signed: Dr. Rollene Fare

## 2020-05-16 ENCOUNTER — Inpatient Hospital Stay: Payer: Self-pay

## 2020-05-16 ENCOUNTER — Inpatient Hospital Stay (HOSPITAL_COMMUNITY): Payer: Managed Care, Other (non HMO)

## 2020-05-16 DIAGNOSIS — J9600 Acute respiratory failure, unspecified whether with hypoxia or hypercapnia: Secondary | ICD-10-CM

## 2020-05-16 DIAGNOSIS — J9601 Acute respiratory failure with hypoxia: Secondary | ICD-10-CM

## 2020-05-16 DIAGNOSIS — R609 Edema, unspecified: Secondary | ICD-10-CM

## 2020-05-16 LAB — ECHOCARDIOGRAM COMPLETE
Area-P 1/2: 6.32 cm2
Calc EF: 57.5 %
Height: 62 in
S' Lateral: 2.3 cm
Single Plane A2C EF: 65 %
Single Plane A4C EF: 46.3 %
Weight: 2048 oz

## 2020-05-16 LAB — CBC
HCT: 37.7 % (ref 36.0–46.0)
Hemoglobin: 13.1 g/dL (ref 12.0–15.0)
MCH: 34.4 pg — ABNORMAL HIGH (ref 26.0–34.0)
MCHC: 34.7 g/dL (ref 30.0–36.0)
MCV: 99 fL (ref 80.0–100.0)
Platelets: 93 10*3/uL — ABNORMAL LOW (ref 150–400)
RBC: 3.81 MIL/uL — ABNORMAL LOW (ref 3.87–5.11)
RDW: 10.7 % — ABNORMAL LOW (ref 11.5–15.5)
WBC: 8.1 10*3/uL (ref 4.0–10.5)
nRBC: 0 % (ref 0.0–0.2)

## 2020-05-16 LAB — URINALYSIS, ROUTINE W REFLEX MICROSCOPIC
Bacteria, UA: NONE SEEN
Bilirubin Urine: NEGATIVE
Bilirubin Urine: NEGATIVE
Glucose, UA: 50 mg/dL — AB
Glucose, UA: NEGATIVE mg/dL
Hgb urine dipstick: NEGATIVE
Ketones, ur: NEGATIVE mg/dL
Ketones, ur: NEGATIVE mg/dL
Leukocytes,Ua: NEGATIVE
Leukocytes,Ua: NEGATIVE
Nitrite: NEGATIVE
Nitrite: NEGATIVE
Protein, ur: NEGATIVE mg/dL
Protein, ur: NEGATIVE mg/dL
Specific Gravity, Urine: 1.004 — ABNORMAL LOW (ref 1.005–1.030)
Specific Gravity, Urine: 1.01 (ref 1.005–1.030)
pH: 6 (ref 5.0–8.0)
pH: 7 (ref 5.0–8.0)

## 2020-05-16 LAB — COMPREHENSIVE METABOLIC PANEL
ALT: 32 U/L (ref 0–44)
AST: 49 U/L — ABNORMAL HIGH (ref 15–41)
Albumin: 3.2 g/dL — ABNORMAL LOW (ref 3.5–5.0)
Alkaline Phosphatase: 30 U/L — ABNORMAL LOW (ref 38–126)
Anion gap: 12 (ref 5–15)
BUN: 9 mg/dL (ref 6–20)
CO2: 20 mmol/L — ABNORMAL LOW (ref 22–32)
Calcium: 9.2 mg/dL (ref 8.9–10.3)
Chloride: 101 mmol/L (ref 98–111)
Creatinine, Ser: 0.8 mg/dL (ref 0.44–1.00)
GFR calc Af Amer: >60 mL/min (ref >60)
GFR calc non Af Amer: >60 mL/min (ref >60)
Glucose, Bld: 182 mg/dL — ABNORMAL HIGH (ref 70–99)
Potassium: 3.2 mmol/L — ABNORMAL LOW (ref 3.5–5.1)
Sodium: 133 mmol/L — ABNORMAL LOW (ref 135–145)
Total Bilirubin: 0.9 mg/dL (ref 0.3–1.2)
Total Protein: 6.5 g/dL (ref 6.5–8.1)

## 2020-05-16 LAB — GLUCOSE, CAPILLARY
Glucose-Capillary: 100 mg/dL — ABNORMAL HIGH (ref 70–99)
Glucose-Capillary: 102 mg/dL — ABNORMAL HIGH (ref 70–99)
Glucose-Capillary: 106 mg/dL — ABNORMAL HIGH (ref 70–99)
Glucose-Capillary: 111 mg/dL — ABNORMAL HIGH (ref 70–99)
Glucose-Capillary: 150 mg/dL — ABNORMAL HIGH (ref 70–99)
Glucose-Capillary: 166 mg/dL — ABNORMAL HIGH (ref 70–99)
Glucose-Capillary: 176 mg/dL — ABNORMAL HIGH (ref 70–99)
Glucose-Capillary: 181 mg/dL — ABNORMAL HIGH (ref 70–99)
Glucose-Capillary: 28 mg/dL — CL (ref 70–99)
Glucose-Capillary: 86 mg/dL (ref 70–99)
Glucose-Capillary: 86 mg/dL (ref 70–99)
Glucose-Capillary: 91 mg/dL (ref 70–99)

## 2020-05-16 LAB — PHOSPHORUS: Phosphorus: 4.1 mg/dL (ref 2.5–4.6)

## 2020-05-16 LAB — STREP PNEUMONIAE URINARY ANTIGEN: Strep Pneumo Urinary Antigen: NEGATIVE

## 2020-05-16 LAB — ANGIOTENSIN CONVERTING ENZYME: Angiotensin-Converting Enzyme: 6 U/L — ABNORMAL LOW (ref 14–82)

## 2020-05-16 LAB — LACTIC ACID, PLASMA: Lactic Acid, Venous: 3.6 mmol/L (ref 0.5–1.9)

## 2020-05-16 LAB — MAGNESIUM: Magnesium: 1.5 mg/dL — ABNORMAL LOW (ref 1.7–2.4)

## 2020-05-16 LAB — PROCALCITONIN: Procalcitonin: <0.1 ng/mL

## 2020-05-16 LAB — SEDIMENTATION RATE: Sed Rate: 22 mm/hr (ref 0–22)

## 2020-05-16 LAB — CYCLIC CITRUL PEPTIDE ANTIBODY, IGG/IGA: CCP Antibodies IgG/IgA: 5 units (ref 0–19)

## 2020-05-16 MED ORDER — SODIUM CHLORIDE 0.9% FLUSH: 10.0000 mL | INTRAVENOUS | Status: DC | PRN

## 2020-05-16 MED ORDER — ACETAMINOPHEN 325 MG PO TABS
650.0000 mg | ORAL_TABLET | Freq: Four times a day (QID) | ORAL | Status: DC | PRN
  Administered 2020-05-23 – 2020-05-31 (×15): 650 mg
  Filled 2020-05-16 (×15): qty 2

## 2020-05-16 MED ORDER — CHLORHEXIDINE GLUCONATE 0.12% ORAL RINSE (MEDLINE KIT)
15.0000 mL | Freq: Two times a day (BID) | OROMUCOSAL | Status: DC
  Administered 2020-05-16 – 2020-05-31 (×28): 15 mL via OROMUCOSAL

## 2020-05-16 MED ORDER — PIPERACILLIN-TAZOBACTAM 3.375 G IVPB
3.3750 g | Freq: Three times a day (TID) | INTRAVENOUS | Status: DC
  Administered 2020-05-16 – 2020-05-19 (×8): 3.375 g via INTRAVENOUS
  Filled 2020-05-16 (×9): qty 50

## 2020-05-16 MED ORDER — POLYETHYLENE GLYCOL 3350 17 G PO PACK
17.0000 g | PACK | Freq: Every day | ORAL | Status: DC
  Administered 2020-05-17 – 2020-05-27 (×8): 17 g
  Filled 2020-05-16 (×8): qty 1

## 2020-05-16 MED ORDER — POTASSIUM CHLORIDE 20 MEQ PO PACK
40.0000 meq | PACK | Freq: Three times a day (TID) | ORAL | Status: AC
  Administered 2020-05-16 (×2): 40 meq
  Filled 2020-05-16 (×2): qty 2

## 2020-05-16 MED ORDER — LACTATED RINGERS IV BOLUS
1000.0000 mL | Freq: Once | INTRAVENOUS | Status: AC
  Administered 2020-05-16: 1000 mL via INTRAVENOUS

## 2020-05-16 MED ORDER — AZTREONAM 2 G IJ SOLR
2.0000 g | Freq: Three times a day (TID) | INTRAMUSCULAR | Status: DC
  Filled 2020-05-16: qty 2

## 2020-05-16 MED ORDER — THIAMINE HCL 100 MG/ML IJ SOLN
100.0000 mg | Freq: Every day | INTRAMUSCULAR | Status: DC
  Administered 2020-05-23: 100 mg via INTRAVENOUS
  Filled 2020-05-16 (×4): qty 2

## 2020-05-16 MED ORDER — ORAL CARE MOUTH RINSE
15.0000 mL | OROMUCOSAL | Status: DC
  Administered 2020-05-16 – 2020-05-31 (×146): 15 mL via OROMUCOSAL

## 2020-05-16 MED ORDER — PANTOPRAZOLE SODIUM 40 MG IV SOLR
40.0000 mg | Freq: Every day | INTRAVENOUS | Status: DC
  Administered 2020-05-16 – 2020-05-19 (×4): 40 mg via INTRAVENOUS
  Filled 2020-05-16 (×5): qty 40

## 2020-05-16 MED ORDER — DEXTROSE 50 % IV SOLN
INTRAVENOUS | Status: AC
  Filled 2020-05-16: qty 50

## 2020-05-16 MED ORDER — SODIUM CHLORIDE 0.9% FLUSH
10.0000 mL | Freq: Two times a day (BID) | INTRAVENOUS | Status: DC
  Administered 2020-05-17 – 2020-05-31 (×27): 10 mL

## 2020-05-16 MED ORDER — DOCUSATE SODIUM 50 MG/5ML PO LIQD
100.0000 mg | Freq: Two times a day (BID) | ORAL | Status: DC
  Administered 2020-05-16 – 2020-05-27 (×15): 100 mg
  Filled 2020-05-16 (×18): qty 10

## 2020-05-16 MED ORDER — FOLIC ACID 1 MG PO TABS
1.0000 mg | ORAL_TABLET | Freq: Every day | ORAL | Status: DC
  Administered 2020-05-17 – 2020-05-31 (×14): 1 mg
  Filled 2020-05-16 (×14): qty 1

## 2020-05-16 MED ORDER — ACETAMINOPHEN 650 MG RE SUPP: 650.0000 mg | Freq: Four times a day (QID) | RECTAL | Status: DC | PRN

## 2020-05-16 MED ORDER — PHENYLEPHRINE HCL-NACL 10-0.9 MG/250ML-% IV SOLN
25.0000 ug/min | INTRAVENOUS | Status: DC
  Administered 2020-05-16: 25 ug/min via INTRAVENOUS
  Administered 2020-05-17: 15 ug/min via INTRAVENOUS
  Administered 2020-05-17: 7.5 ug/min via INTRAVENOUS
  Administered 2020-05-17 – 2020-05-18 (×2): 25 ug/min via INTRAVENOUS
  Administered 2020-05-18: 15 ug/min via INTRAVENOUS
  Administered 2020-05-18: 25 ug/min via INTRAVENOUS
  Filled 2020-05-16 (×8): qty 250

## 2020-05-16 MED ORDER — ONDANSETRON HCL 4 MG PO TABS: 4.0000 mg | ORAL_TABLET | Freq: Four times a day (QID) | ORAL | Status: DC | PRN

## 2020-05-16 MED ORDER — SODIUM CHLORIDE 0.9 % IV SOLN
2.0000 g | Freq: Three times a day (TID) | INTRAVENOUS | Status: DC
  Administered 2020-05-16: 2 g via INTRAVENOUS
  Filled 2020-05-16 (×2): qty 2

## 2020-05-16 MED ORDER — MAGNESIUM SULFATE 4 GM/100ML IV SOLN
4.0000 g | Freq: Once | INTRAVENOUS | Status: AC
  Administered 2020-05-16: 4 g via INTRAVENOUS
  Filled 2020-05-16: qty 100

## 2020-05-16 MED ORDER — MIDAZOLAM BOLUS VIA INFUSION
0.2000 mg/kg | INTRAVENOUS | Status: AC | PRN
  Administered 2020-05-16 (×3): 11.6 mg via INTRAVENOUS
  Filled 2020-05-16: qty 12

## 2020-05-16 MED ORDER — SODIUM CHLORIDE 0.9 % IV SOLN
250.0000 mL | INTRAVENOUS | Status: DC
  Administered 2020-05-16 – 2020-05-31 (×7): 250 mL via INTRAVENOUS

## 2020-05-16 MED ORDER — MIDAZOLAM 50MG/50ML (1MG/ML) PREMIX INFUSION
40.0000 mg/h | INTRAVENOUS | Status: DC
  Administered 2020-05-16 (×2): 30 mg/h via INTRAVENOUS
  Administered 2020-05-16 (×3): 10 mg/h via INTRAVENOUS
  Administered 2020-05-16 – 2020-05-17 (×5): 30 mg/h via INTRAVENOUS
  Administered 2020-05-17: 40 mg/h via INTRAVENOUS
  Filled 2020-05-16 (×11): qty 50

## 2020-05-16 MED ORDER — MIDAZOLAM BOLUS VIA INFUSION
0.2000 mg/kg | INTRAVENOUS | Status: AC | PRN
  Administered 2020-05-16 (×6): 11.6 mg via INTRAVENOUS
  Filled 2020-05-16: qty 12

## 2020-05-16 MED ORDER — ONDANSETRON HCL 4 MG/2ML IJ SOLN: 4.0000 mg | Freq: Four times a day (QID) | INTRAMUSCULAR | Status: DC | PRN

## 2020-05-16 MED ORDER — CLEVIDIPINE BUTYRATE 0.5 MG/ML IV EMUL
0.0000 mg/h | INTRAVENOUS | Status: DC
  Filled 2020-05-16: qty 50

## 2020-05-16 MED ORDER — VITAL HIGH PROTEIN PO LIQD
1000.0000 mL | ORAL | Status: DC
  Administered 2020-05-16 – 2020-05-17 (×2): 1000 mL

## 2020-05-16 MED ORDER — THIAMINE HCL 100 MG PO TABS
100.0000 mg | ORAL_TABLET | Freq: Every day | ORAL | Status: DC
  Administered 2020-05-17 – 2020-05-31 (×14): 100 mg
  Filled 2020-05-16 (×14): qty 1

## 2020-05-16 NOTE — Progress Notes (Signed)
2D echocardiogram has been performed. 

## 2020-05-16 NOTE — Procedures (Signed)
Patient Name: ERILYN PEARMAN GYK:[id number] Epilepsy Attending:Emaan Gary [name] Referring Physician/Provider:Dr Brooke Dare Duration:05/15/2020 [phone] to 05/16/2020 0300  Patient history:29 year old female with history of alcohol abuse admitted with acute pancreatitis with hospital admission complicated with significantly elevated blood pressure, withdrawal, seizure and worsening mental status. EEG to evaluate for seizure.  Level of alertness:Awake/lethargic  AEDs during EEG study:LEV  Technical aspects: This EEG study was done with scalp electrodes positioned according to the 10-20 International system of electrode placement. Electrical activity was acquired at a sampling rate of 500Hz  and reviewed with a high frequency filter of 70Hz  and a low frequency filter of 1Hz . EEG data were recorded continuously and digitally stored.   Description:No posterior dominant rhythm was seen. EEG showed spikes and polyspikes in left posterior temporal region, maximal P7 at times periodic at 1-1.5 and rhythmic. There were also brief 1 seconds periods of eeg attenuation after a run of polyspikes.EEGalsoshowed continuousgeneralizedand lateralized polymorphic3 to 6 Hz theta-delta slowing. Hyperventilation and photic stimulation were not performed.   ABNORMALITY -Periodic epileptiform discharges plus, left posterior temporal region - Continued slow, generalized and lateralized left  IMPRESSION: This studyshowed evidence of epileptogenicity arising fromleft posterior temporal region as well as cortical dysfunction in left hemisphere due to underlying structural abnormality, PRES. The periodic epileptiform discharges are on the ictal-interictal continuum. The morphology of the periodic discharges as well as rhythmic nature is suggestive of high potential for seizure ( ictal). Additionally, there is evidence ofmoderate diffuse encephalopathy, nonspecific etiology but likely related  toseizure.  Abhiraj Dozal 

## 2020-05-16 NOTE — Progress Notes (Signed)
Bilateral lower extremity venous duplex completed. Refer to "CV Proc" under chart review to view preliminary results.  05/16/2020 11:44 AM Eula Fried., MHA, RVT, RDCS, RDMS

## 2020-05-16 NOTE — Progress Notes (Signed)
Peripherally Inserted Central Catheter Placement  The IV Nurse has discussed with the patient and/or persons authorized to consent for the patient, the purpose of this procedure and the potential benefits and risks involved with this procedure.  The benefits include less needle sticks, lab draws from the catheter, and the patient may be discharged home with the catheter. Risks include, but not limited to, infection, bleeding, blood clot (thrombus formation), and puncture of an artery; nerve damage and irregular heartbeat and possibility to perform a PICC exchange if needed/ordered by physician.  Alternatives to this procedure were also discussed.  Bard Power PICC patient education guide, fact sheet on infection prevention and patient information card has been provided to patient /or left at bedside.    PICC Placement Documentation  PICC Triple Lumen 05/16/20 PICC Right Basilic 39 cm 3 cm (Active)  Indication for Insertion or Continuance of Line Vasoactive infusions 05/16/20 1555  Exposed Catheter (cm) 3 cm 05/16/20 1555  Site Assessment Clean;Dry;Intact 05/16/20 1555  Lumen #1 Status Flushed;Blood return noted;Saline locked 05/16/20 1555  Lumen #2 Status Flushed;Blood return noted;Saline locked 05/16/20 1555  Lumen #3 Status Blood return noted;Flushed;Saline locked 05/16/20 1555  Dressing Type Securing device;Transparent 05/16/20 1555  Dressing Status Clean;Dry;Intact 05/16/20 1555  Antimicrobial disc in place? Yes 05/16/20 1555  Dressing Change Due 05/23/20 05/16/20 1555       Romie Jumper 05/16/2020, 3:58 PM

## 2020-05-16 NOTE — Progress Notes (Signed)
Nutrition Follow-up / Consult  DOCUMENTATION CODES:   Not applicable  INTERVENTION:   Initiate tube feeding via OG tube: Vital High Protein at 45 ml/h (1080 ml per day)  Provides 1080 kcal (1809 kcal total with propofol), 95 gm protein, 903 ml free water daily  NUTRITION DIAGNOSIS:   Inadequate oral intake related to inability to eat as evidenced by NPO status.  GOAL:   Patient will meet greater than or equal to 90% of their needs  MONITOR:   Vent status, TF tolerance, Labs  REASON FOR ASSESSMENT:   Ventilator, Consult Enteral/tube feeding initiation and management  ASSESSMENT:    29 yo female admitted with acute on chronic pancreatitis and alcohol withdrawal. PMH includes alcohol abuse, HTN.   Discussed patient in ICU rounds and with RN today. MRI 9/20 results c/w PRES. Intubated overnight for burst suppression. On continuous EEG. Cleviprex has been stopped, but may need to be resumed for BP control. Plans for autoimmune testing.  PICC ordered for today.   Patient is currently intubated on ventilator support MV: 7.7 L/min Temp (24hrs), Avg:98.8 F (37.1 C), Min:94 F (34.4 C), Max:102.2 F (39 C)  Propofol: 27.6 ml/hr providing 729 kcal from lipid  Labs reviewed. Sodium 133, K 3.2, mag 1.5 CBG: 86-150-176-100  Medications reviewed and include colace, folic acid, Keppra, MVI with minerals, dilantin, miralax, thiamine, KCl, mag sulfate, neosynephrine, propofol.   Diet Order:   Diet Order            Diet NPO time specified  Diet effective now                 EDUCATION NEEDS:   Not appropriate for education at this time  Skin:  Skin Assessment: Reviewed RN Assessment  Last BM:  9/18 smear  Height:   Ht Readings from Last 1 Encounters:  05/10/20 5\' 2"  (1.575 m)    Weight:   Wt Readings from Last 1 Encounters:  05/10/20 58.1 kg    Ideal Body Weight:  50 kg  BMI:  Body mass index is 23.41 kg/m.  Estimated Nutritional Needs:    Kcal:  1740  Protein:  85-100 gm  Fluid:  >/= 1.8 L    05/12/20, RD, LDN, CNSC Please refer to Amion for contact information.

## 2020-05-16 NOTE — Progress Notes (Signed)
Hypoglycemic Event  CBG: 28  Treatment: 25 mg Dextrose  Symptoms: Cold extremities, Tachycardia  Follow-up CBG: Time: 0419 CBG Result: 150  Comments/MD notified: Notified Elink, awaiting orders    Donna Short

## 2020-05-16 NOTE — Progress Notes (Signed)
eLink Physician-Brief Progress Note Patient Name: Donna Short DOB: [birth year] MRN: [id number]   Date of Service  05/16/2020  HPI/Events of Note  Notified of borderline BP with SBP 80s On fixed dose propofol drip 80 mcg/kg/min as well as prn benzos  eICU Interventions  Ordered to start neosynephrine     Intervention Category Major Interventions: Hypotension - evaluation and management  Darl Pikes 05/16/2020, 6:07 AM

## 2020-05-16 NOTE — Progress Notes (Signed)
NAME:  Donna Short, MRN:  [id number], DOB:  20-Mar-1991, LOS: 5 ADMISSION DATE:  05/10/2020, CONSULTATION DATE:  05/14/20 REFERRING MD:  Loney Loh, CHIEF COMPLAINT:  Encephalopathy   Brief History   Donna Short is a 29 y.o. female with a PMHx of EtOH abuse and HTN admitted to [facility] 9/17 for acute on chronic pancreatitis and EtOH withdrawal. During her hospital stay she developed uncontrolled HTN and breakthrough seizure activity with MRI findings consistent with PRES.   Past Medical History   has a past medical history of Asthma, Depression, History of chicken pox, Hypertension, Migraines, Pancreatitis (01/2018), and Seizures (HCC). Allergies  Allergen Reactions  . Claritin [Loratadine] Itching  . Peanut-Containing Drug Products Other (See Comments)  . Benadryl [Diphenhydramine Hcl (Sleep)] Rash  . Latex Rash  . Penicillins Itching and Rash    Has patient had a PCN reaction causing immediate rash, facial/tongue/throat swelling, SOB or lightheadedness with hypotension: Yes Has patient had a PCN reaction causing severe rash involving mucus membranes or skin necrosis: No Has patient had a PCN reaction that required hospitalization: Yes Has patient had a PCN reaction occurring within the last 10 years: No If all of the above answers are "NO", then may proceed with Cephalosporin use.;  . Shrimp [Shellfish Allergy] Hives and Rash     Significant Hospital Events   9/16 - admit 9/18, 9/19 breakthrough seizures 9/20- MRI showing PRES 9/21 -Patient had a hypoglycemic episode with BG 61 and shaking overnight consistent with previous episodes past 2 days. Symptoms responded well to 8 oz of juice and BG improved to 72 within 30 minutes. Likely due to poor oral intake intubation for burst suppression  Consults:  Neurology- consulted 9/20, recommended maintaining strict BP control with MAP goals 80-90, Keppra 1g 2x daily for seizure control  Procedures:  none  Significant Diagnostic  Tests:  9!6 = Abdominal CT- findings consistent with acute on chronic pancreatitis predominantly involving the proximal pancreas. No peripancreatic fluid collections are identified. EEG- 9/20, evidence of epileptogenicity arising from left posterior temporal region as well as cortical dysfunction in left hemisphere due to underlying structural abnormality, PRES MRI- 9/20, patchy/extensive brain edema in a pattern consistent with PRES  Micro Data:  None  Antimicrobials:  None   Interim history/subjective:   9/22 - intubated yesterday for burst suppressio needs -> post intubation hypothermic/then febrile 102F with hypotension/pressor need. Off cleviprex -> this morning off pressors.  Continued hypoglycemia -> needing more D5. Not yet on TF. Making urine. Neuro Dr Wilford Corner concerned about recurrent PRES (milder in Jan 2021). Wants to consider LP if fever persists and culture negative. Currently on diprivan gtt and MAP at goal - 80-90  Objective   Blood pressure 112/80, pulse (!) 128, temperature (!) 102.2 F (39 C), temperature source Oral, resp. rate 18, height  (1.575 m), weight 58.1 kg, SpO2 100 %.    Vent Mode: PRVC FiO2 (%):  [50 %-100 %] 50 % Set Rate:  [18 bmp] 18 bmp Vt Set:  [420 mL] 420 mL PEEP:  [5 cmH20] 5 cmH20 Plateau Pressure:  [13 cmH20-16 cmH20] 16 cmH20   Intake/Output Summary (Last 24 hours) at 05/16/2020 0853 Last data filed at 05/16/2020 0800 Gross per 24 hour  Intake 3022.59 ml  Output 1000 ml  Net 2022.59 ml   Filed Weights   05/10/20 1131  Weight: 58.1 kg    Examination: General Appearance:  Looks criticall ill OBESE - no Head:  Normocephalic, without obvious abnormality,  atraumatic Eyes:  PERRL - yes, conjunctiva/corneas - mddy     Ears:  Normal external ear canals, both ears Nose:  G tube - no Throat:  ETT TUBE - yes , OG tube - yes Neck:  Supple,  No enlargement/tenderness/nodules Lungs: Clear to auscultation bilaterally, Ventilator   Synchrony -  yes Heart:  S1 and S2 normal, no murmur, CVP - no.  Pressors - no Abdomen:  Soft, no masses, no organomegaly Genitalia / Rectal:  Not done Extremities:  Extremities- intact Skin:  ntact in exposed areas . Sacral area - not examined Neurologic:  Sedation - diprivan gtt -> RASS - -3 . Moves all 4s - yes. CAM-ICU - cannot test . Orientation - cannot test      Resolved Hospital Problem list   AKI- initially presented with Cr 1.4, baseline .7-.9, normalized following administration of IV fluids    Assessment & Plan:  PRES, Acute Encephalopathy -- MAP goals 80-90 -- Cleviprex drip -- labetalol 200 mg TID, Hydralazine 25 mg q8h, Lisinopril 20 mg qd,  -- Keppra 1g x2 daily -- q4h neuro checks   EtOH Withdrawal -- continue Ativan within CIWA  Acute on Chronic Pancreatitis -- clear thin liquid diet although patient not eating -- D5NS    ASSESSMENT / PLAN:  PULMONARY  A:  Acute Resp Failure - s/p intubation for burst suppression 05/14/20   -> 05/16/2020 - > does not meet criteria for SBT/Extubation in setting of Acute Respiratory Failure due to ongoing acute encephaloapthy    P:   Full vent support  VAP protocool   NEUROLOGIC A:   PRES recurrence with seizures 9/19., Confirmed on MRI 9/20 . Worse compared to Jan 2021.  05/16/2020 -well sedated with diprivan. EEG ongoing  P:   Per neuro Diprivan for sedation  - RASS goal 0 to -2 Get Autoimmune and vasculitis profile   VASCULAR A:   Hypertensive all admission needing cleviprex Hypotensive post intubatioin 9/21 and ?  Sepsis - needing transient pressors  - MAP 88 with diprivan gtt. Off cleviprex  P:  MAP goal 80-90 (currently there with diprivan gtt) DC neo off MAR DC oral BP drugs off MAR Keep cleviprex on MAR in case of future need Get ECHO  CARDIAC STRUCTURAL A: Normal heart on echo 2019  P: Get echo  CARDIAC ELECTRICAL  A: In Sinus   P: Monitor QTc - get q2 lead  INFECTIOUS A:   Hypothermic  and then febrile 9/21-9/22 with lactic acidosis 3 MRSA PCR negative Lactic acodiss +  P:   Fluid bolus -> track lactate Pan culture Check PCT Fluid bolus Empritic Carbapenem Urine strep Urine leg LP based on course (per neuro)  RENAL A:  AKI resolved 9/17  P:  monitor  ELECTROLYTES A:  Hypokalemia Hypomagnesemia   P: Replete both   GASTROINTESTINAL A:   Pancreatitis (acute on chronic ) - etoh 05/10/2020 - admit based on CT with lipase 101 at admit  P:   Start TF and monitor  HEMATOLOGIC   - HEME A:  AT risk anemia critical illness   P:  - PRBC for hgb </= 6.9gm%    - exceptions are   -  if ACS susepcted/confirmed then transfuse for hgb </= 8.0gm%,  or    -  active bleeding with hemodynamic instability, then transfuse regardless of hemoglobin value   At at all times try to transfuse 1 unit prbc as possible with exception of active hemorrhage   HEMATOLOGIC - Platelets  A Baseline platelets 150-200. S/PLAN: thrombocytopenia 80s in [facility] 2021  Current - similar thrombocytopenia got worse with nadior 05/14/20 to 60 and then better to 93. On Lovenox  P Continue lovenox Get HITT panel Get Duplex LE  ENDOCRINE A:   Hypoglycemia - needing D5NS    P:   Start TF If needed got to d10 SSI  MSK/DERM AT risk for Sacral deub  Plan  - nursing care   Best practice:  Diet: start TF Pain/Anxiety/Delirium protocol (if indicated): diprivan with prn  VAP protocol (if indicated): N/a DVT prophylaxis: Lovenox GI prophylaxis: N/a Glucose control: ssi Mobility: bedrest Code Status: Full code Family Communication:  FMLA form done 9/21. Mom Meryl Dare [phone]) updated 9/21 at bedside and over phone 05/16/20  Disposition: Continued ICU care     ATTESTATION & SIGNATURE   The patient Donna Short is critically ill with multiple organ systems failure and requires high complexity decision making for assessment and support, frequent evaluation and  titration of therapies, application of advanced monitoring technologies and extensive interpretation of multiple databases.   Critical Care Time devoted to patient care services described in this note is  45  Minutes. This time reflects time of care of this signee Dr Kalman Shan. This critical care time does not reflect procedure time, or teaching time or supervisory time of PA/NP/Med student/Med Resident etc but could involve care discussion time     Dr. Kalman Shan, M.D., [facility].C.P Pulmonary and Critical Care Medicine Staff Physician [facility] Pager: [phone], If no answer or between  15:00h - 7:00h: call [phone]  05/16/2020 8:53 AM    LABS    PULMONARY Recent Labs  Lab 05/15/20 2205  PHART 7.533*  PCO2ART 34.6  PO2ART 589*  HCO3 29.7*  TCO2 31  O2SAT 100.0    CBC Recent Labs  Lab 05/14/20 0134 05/14/20 0134 05/15/20 0123 05/15/20 2205 05/16/20 0021  HGB 12.1   < > 12.5 11.2* 13.1  HCT 35.7*   < > 35.6* 33.0* 37.7  WBC 7.8  --  8.9  --  8.1  PLT 60*  --  80*  --  93*   < > = values in this interval not displayed.    COAGULATION No results for input(s): INR in the last 168 hours.  CARDIAC  No results for input(s): TROPONINI in the last 168 hours. No results for input(s): PROBNP in the last 168 hours.   CHEMISTRY Recent Labs  Lab 05/11/20 0410 05/11/20 0410 05/12/20 0331 05/12/20 0331 05/14/20 0134 05/14/20 0134 05/14/20 1444 05/14/20 1444 05/15/20 0123 05/15/20 0123 05/15/20 2205 05/16/20 0021  NA 135   < > 131*   < > 134*  --  133*  --  134*  --  138 133*  K 4.5   < > 3.9   < > 2.9*   < > 4.5   < > 3.6   < > 3.4* 3.2*  CL 100   < > 95*  --  96*  --  95*  --  97*  --   --  101  CO2 19*   < > 20*  --  22  --  22  --  22  --   --  20*  GLUCOSE 95   < > 149*  --  99  --  84  --  107*  --   --  182*  BUN 19   < >  11  --  13  --  13  --  11  --   --  9  CREATININE 1.19*  1.22*   <  > 0.92  --  0.76  --  0.72  --  0.66  --   --  0.80  CALCIUM 8.3*   < > 9.3  --  9.1  --  9.7  --  9.6  --   --  9.2  MG 1.6*  --   --   --  1.5*  --   --   --   --   --   --  1.5*  PHOS 1.8*  --   --   --  3.2  --   --   --   --   --   --  4.1   < > = values in this interval not displayed.   Estimated Creatinine Clearance: 80.6 mL/min (by C-G formula based on SCr of 0.8 mg/dL).   LIVER Recent Labs  Lab 05/11/20 0410 05/12/20 0331 05/14/20 0134 05/15/20 0123 05/16/20 0021  AST 69* 58* 44* 49* 49*  ALT 42 40 28 31 32  ALKPHOS 45 44 31* 33* 30*  BILITOT 1.0 1.0 1.3* 0.5 0.9  PROT 7.7 7.9 6.7 7.0 6.5  ALBUMIN 3.9 3.9 3.1* 3.5 3.2*     INFECTIOUS Recent Labs  Lab 05/10/20 1637 05/11/20 0410 05/15/20 1108  LATICACIDVEN 2.6* 1.0 3.6*     ENDOCRINE CBG (last 3)  Recent Labs    05/16/20 0418 05/16/20 0631 05/16/20 0834  GLUCAP 150* 176* 100*         IMAGING x48h  - image(s) personally visualized  -   highlighted in bold EEG  Result Date: 05/14/2020 [name], MD     05/14/2020 10:52 AM Patient Name: BHAKTI LABELLA MRN: [id number] Epilepsy Attending: [name] Referring Physician/Provider: Dr Georgiana Spinner Aroor Date: 05/14/2020 Duration: 24.25 mins Patient history: 29 year old female with history of alcohol abuse admitted with acute pancreatitis with hospital admission complicated with significantly elevated blood pressure, withdrawal, seizure and worsening mental status. EEG to evaluate for seizure. Level of alertness: Awake/ lethargic AEDs during EEG study: LEV Technical aspects: This EEG study was done with scalp electrodes positioned according to the 10-20 International system of electrode placement. Electrical activity was acquired at a sampling rate of 500Hz  and reviewed with a high frequency filter of 70Hz  and a low frequency filter of 1Hz . EEG data were recorded continuously and digitally stored. Description: No posterior dominant rhythm was seen. EEG  showed spikes and polyspikes in left posterior temporal region, maximal P7 at times periodic at 0.5-1Hz  and rhythmic. There were also brief 1 seconds periods of eeg attenuation after a run of polyspikes. EEG also showed continuous  generalized ad lateralized polymorphic 3 to 6 Hz theta-delta slowing. Hyperventilation and photic stimulation were not performed.   ABNORMALITY - Periodic epileptiform discharges plus, left posterior temporal region -Continued slow, generalized and lateralized left IMPRESSION: This study showed evidence of epileptogenicity arising from left posterior temporal region as well as cortical dysfunction in left hemisphere due to underlying structural abnormality, PRES. The periodic epileptiform discharges are on the ictal-interictal continuum. The morphology of the periodic discharges as well as rhythmic nature is suggestive of high potential for seizure ( ictal). Additionally, there is evidence of moderate diffuse encephalopathy, nonspecific etiology but likely related to seizure.   DG CHEST PORT 1 VIEW  Result Date: 05/15/2020 CLINICAL DATA:  Intubated,  seizures EXAM: PORTABLE CHEST 1 VIEW COMPARISON:  05/10/2020 FINDINGS: Single frontal view of the chest demonstrates endotracheal tube overlying tracheal air column, approximately 1 cm above carina. Recommend retracting 1-1.5 cm. Enteric catheter passes below diaphragm tip excluded by collimation. Cardiac silhouette is unremarkable. No airspace disease, effusion, or pneumothorax. No acute bony abnormalities. IMPRESSION: 1. Endotracheal tube approximately 1 cm above carina, recommend retracting 1-1.5 cm. 2. No acute intrathoracic process. Electronically Signed   By: Sharlet SalinaMichael  Brown M.D.   On: 05/15/2020 18:40   Overnight EEG with video  Result Date: 05/15/2020 Charlsie QuestYadav, Priyanka O, MD     05/15/2020  8:22 AM Patient Name: Romona CurlsShamika M Swinson MRN: [id number] Epilepsy Attending: Charlsie QuestPriyanka O Yadav Referring Physician/Provider: Dr  Brooke DareSrishti Bhagat Duration: 05/14/2020 0921 to 05/15/2020 0800  Patient history: 29 year old female with history of alcohol abuse admitted with acute pancreatitis with hospital admission complicated with significantly elevated blood pressure, withdrawal, seizure and worsening mental status. EEG to evaluate for seizure.  Level of alertness: Awake/ lethargic  AEDs during EEG study: LEV  Technical aspects: This EEG study was done with scalp electrodes positioned according to the 10-20 International system of electrode placement. Electrical activity was acquired at a sampling rate of 500Hz  and reviewed with a high frequency filter of 70Hz  and a low frequency filter of 1Hz . EEG data were recorded continuously and digitally stored.  Description: No posterior dominant rhythm was seen. EEG showed spikes and polyspikes in left posterior temporal region, maximal P7 at times periodic at 0.5-1Hz  and rhythmic. There were also brief 1 seconds periods of eeg attenuation after a run of polyspikes. EEG also showed continuous  generalized ad lateralized polymorphic 3 to 6 Hz theta-delta slowing. Hyperventilation and photic stimulation were not performed.    ABNORMALITY - Periodic epileptiform discharges plus, left posterior temporal region - Continued slow, generalized and lateralized left  IMPRESSION: This study showed evidence of epileptogenicity arising from left posterior temporal region as well as cortical dysfunction in left hemisphere due to underlying structural abnormality, PRES. The periodic epileptiform discharges are on the ictal-interictal continuum. The morphology of the periodic discharges as well as rhythmic nature is suggestive of high potential for seizure ( ictal). Additionally, there is evidence of moderate diffuse encephalopathy, nonspecific etiology but likely related to seizure.  Priyanka [name] Yadav

## 2020-05-16 NOTE — Progress Notes (Addendum)
Pharmacy Antibiotic Note  Donna Short is a 29 y.o. female admitted on 05/10/2020 with seizures and AMS, no concerned for sepsis.  Pharmacy has been consulted for aztreonam dosing.   Patient has complex neurological history with concern for recurrent PRES on this admission. She was hypotensive early AM requiring brief pressor use now with slight fever (Tm 102.2). LA yesterday elevated at 3.6, WBC wnl today at 8.1. Given reported penicillin allergy and no documented use of penicillins/cefalosporins since allergy documented, plan to proceed with aztreonam at this time.   Plan: Aztreonam 2g IV q8h Monitor clinical progress, WBC, renal function F/u cultures, LOT, narrow antibiotics as able   ADDENDUM 9/22 12:00 -Was informed by ID that patient had previously received Zosyn on past admit and tolerated it without allergic reaction. Spoke with Dr. Marchelle Gearing and have transitioned patient to Zosyn for empiric coverage at this time.    Height: 5\' 2"  (157.5 cm) Weight: 58.1 kg (128 lb) IBW/kg (Calculated) : 50.1  Temp (24hrs), Avg:98.8 F (37.1 C), Min:94 F (34.4 C), Max:102.2 F (39 C)  Recent Labs  Lab 05/10/20 1203 05/10/20 1203 05/10/20 1637 05/11/20 0410 05/11/20 0410 05/12/20 0331 05/14/20 0134 05/14/20 1444 05/15/20 0123 05/15/20 1108 05/16/20 0021  WBC 13.1*  --   --   --   --  10.0 7.8  --  8.9  --  8.1  CREATININE 1.39*   < >  --  1.19*  1.22*   < > 0.92 0.76 0.72 0.66  --  0.80  LATICACIDVEN  --   --  2.6* 1.0  --   --   --   --   --  3.6*  --    < > = values in this interval not displayed.    Estimated Creatinine Clearance: 80.6 mL/min (by C-G formula based on SCr of 0.8 mg/dL).    Allergies  Allergen Reactions  . Claritin [Loratadine] Itching  . Peanut-Containing Drug Products Other (See Comments)  . Benadryl [Diphenhydramine Hcl (Sleep)] Rash  . Latex Rash  . Penicillins Itching and Rash    Has patient had a PCN reaction causing immediate rash,  facial/tongue/throat swelling, SOB or lightheadedness with hypotension: Yes Has patient had a PCN reaction causing severe rash involving mucus membranes or skin necrosis: No Has patient had a PCN reaction that required hospitalization: Yes Has patient had a PCN reaction occurring within the last 10 years: No If all of the above answers are "NO", then may proceed with Cephalosporin use.;  . Shrimp [Shellfish Allergy] Hives and Rash    Antimicrobials this admission: Aztreonam 9/22 >>   Microbiology results: 9/20 MRSA PCR: negative 9/22 Blood cx: sent 9/22 TA: sent   Thank you for allowing pharmacy to be a part of this patient's care.  10/22, PharmD PGY1 Acute Care Pharmacy Resident  05/16/2020 9:42 AM  Please check [email or web address] for unit-specific pharmacy phone numbers.

## 2020-05-16 NOTE — Progress Notes (Signed)
eLink Physician-Brief Progress Note Patient Name: Donna Short DOB: Jun 17, 1991 MRN: [id number]   Date of Service  05/16/2020  HPI/Events of Note  Notified of hypoglycemic episodes Currently on D5NS at 50 and NS at 50  eICU Interventions  Increased D5 to 125 and discontinued normal saline     Intervention Category Intermediate Interventions: Other:  Darl Pikes 05/16/2020, 4:23 AM

## 2020-05-16 NOTE — Progress Notes (Signed)
Neurology Progress Note   S:// Seen and examined. EEG continues to show PLEDs plus pattern.  O:// Current vital signs: BP 108/79   Pulse (!) 105   Temp (!) 100.6 F (38.1 C) (Oral)   Resp 18   Ht 5\' 2"  (1.575 m)   Wt 58.1 kg   SpO2 99%   BMI 23.41 kg/m  Vital signs in last 24 hours: Temp:  [94 F (34.4 C)-102.2 F (39 C)] 100.6 F (38.1 C) (09/22 0915) Pulse Rate:  [65-134] 105 (09/22 0915) Resp:  [14-26] 18 (09/22 0915) BP: (83-129)/(53-103) 108/79 (09/22 0915) SpO2:  [68 %-100 %] 99 % (09/22 0915) FiO2 (%):  [50 %-100 %] 50 % (09/22 0800) Neurological exam Sedated intubated Spontaneously moves all fours Pupils equal round reactive to light Corneals present Breathing over the vent Does not follow commands but moves all 4 extremities noxious stimulation.   Medications  Current Facility-Administered Medications:  .  0.9 %  sodium chloride infusion, , Intravenous, PRN, 09-20-1996, MD, Stopped at 05/15/20 1336 .  0.9 %  sodium chloride infusion, 250 mL, Intravenous, Continuous, Aventura, Emily T, MD, Last Rate: 10 mL/hr at 05/16/20 0639, 250 mL at 05/16/20 0639 .  acetaminophen (TYLENOL) tablet 650 mg, 650 mg, Oral, Q6H PRN **OR** acetaminophen (TYLENOL) suppository 650 mg, 650 mg, Rectal, Q6H PRN, Chotiner, 05/18/20, MD .  albuterol (VENTOLIN HFA) 108 (90 Base) MCG/ACT inhaler 1-2 puff, 1-2 puff, Inhalation, Q6H PRN, Chotiner, Claudean Severance, MD .  aztreonam (AZACTAM) injection 2 g, 2 g, Intramuscular, Q8H, Ramaswamy, Murali, MD .  chlorhexidine (PERIDEX) 0.12 % solution 15 mL, 15 mL, Mouth Rinse, BID, Claudean Severance, MD, 15 mL at 05/15/20 2215 .  Chlorhexidine Gluconate Cloth 2 % PADS 6 each, 6 each, Topical, Daily, 2216, MD, 6 each at 05/15/20 1542 .  clevidipine (CLEVIPREX) infusion 0.5 mg/mL, 0-21 mg/hr, Intravenous, Continuous, Ramaswamy, Murali, MD .  cloBAZam (ONFI) tablet 5 mg, 5 mg, Per Tube, BID, 05/17/20 T, MD, 5 mg at 05/15/20  2234 .  dextrose 5 %-0.9 % sodium chloride infusion, , Intravenous, Continuous, Aventura, Emily T, MD, Last Rate: 125 mL/hr at 05/16/20 0800, Rate Verify at 05/16/20 0800 .  docusate (COLACE) 50 MG/5ML liquid 100 mg, 100 mg, Oral, BID, Ramaswamy, Murali, MD, 100 mg at 05/15/20 2248 .  enoxaparin (LOVENOX) injection 40 mg, 40 mg, Subcutaneous, QHS, Chotiner, 2249, MD, 40 mg at 05/15/20 2247 .  fentaNYL (SUBLIMAZE) injection 50 mcg, 50 mcg, Intravenous, Q15 min PRN, 05/17/20, MD .  folic acid (FOLVITE) tablet 1 mg, 1 mg, Oral, Daily, Chotiner, Kalman Shan, MD, 1 mg at 05/13/20 1056 .  HYDROmorphone (DILAUDID) injection 1 mg, 1 mg, Intravenous, Q3H PRN, Chotiner, 05/15/20, MD, 1 mg at 05/14/20 0050 .  levETIRAcetam (KEPPRA) IVPB 1000 mg/100 mL premix, 1,000 mg, Intravenous, Q12H, 05/16/20 T, MD, Last Rate: 400 mL/hr at 05/16/20 0808, 1,000 mg at 05/16/20 0808 .  LORazepam (ATIVAN) injection 2 mg, 2 mg, Intravenous, Once, 05/18/20, MD .  MEDLINE mouth rinse, 15 mL, Mouth Rinse, q12n4p, Gerhard Munch, MD, 15 mL at 05/15/20 1534 .  midazolam (VERSED) injection 1-4 mg, 1-4 mg, Intravenous, Q2H PRN, 05/17/20, MD, 4 mg at 05/16/20 0805 .  midazolam (VERSED) injection 2 mg, 2 mg, Intravenous, Q15 min PRN, 05/18/20, MD, 2 mg at 05/15/20 2003 .  midazolam (VERSED) injection 2 mg, 2 mg, Intravenous, Q2H PRN, 2004, MD .  multivitamin with  minerals tablet 1 tablet, 1 tablet, Per Tube, Daily, Aventura, Irving Burton T, MD .  ondansetron (ZOFRAN) tablet 4 mg, 4 mg, Oral, Q6H PRN **OR** ondansetron (ZOFRAN) injection 4 mg, 4 mg, Intravenous, Q6H PRN, Chotiner, Claudean Severance, MD, 4 mg at 05/11/20 1229 .  phenylephrine (NEOSYNEPHRINE) 10-0.9 MG/250ML-% infusion, 25-200 mcg/min, Intravenous, Titrated, Aventura, Emily T, MD, Last Rate: 37.5 mL/hr at 05/16/20 0800, 25 mcg/min at 05/16/20 0800 .  phenytoin (DILANTIN) injection 100 mg, 100 mg, Intravenous, Q8H, Bhagat,  Srishti L, MD, 100 mg at 05/16/20 0500 .  polyethylene glycol (MIRALAX / GLYCOLAX) packet 17 g, 17 g, Oral, Daily, Ramaswamy, Murali, MD .  propofol (DIPRIVAN) 1000 MG/100ML infusion, 5-80 mcg/kg/min, Intravenous, Titrated, Ramaswamy, Murali, MD, Last Rate: 27.9 mL/hr at 05/16/20 0800, 80 mcg/kg/min at 05/16/20 0800 .  thiamine tablet 100 mg, 100 mg, Oral, Daily, 100 mg at 05/13/20 1056 **OR** thiamine (B-1) injection 100 mg, 100 mg, Intravenous, Daily, Chotiner, Claudean Severance, MD, 100 mg at 05/15/20 0921 Labs CBC    Component Value Date/Time   WBC 8.1 05/16/2020 0021   RBC 3.81 (L) 05/16/2020 0021   HGB 13.1 05/16/2020 0021   HCT 37.7 05/16/2020 0021   PLT 93 (L) 05/16/2020 0021   MCV 99.0 05/16/2020 0021   MCH 34.4 (H) 05/16/2020 0021   MCHC 34.7 05/16/2020 0021   RDW 10.7 (L) 05/16/2020 0021   LYMPHSABS 1.5 11/24/2019 1151   MONOABS 0.8 11/24/2019 1151   EOSABS 0.1 11/24/2019 1151   BASOSABS 0.0 11/24/2019 1151    CMP     Component Value Date/Time   NA 133 (L) 05/16/2020 0021   K 3.2 (L) 05/16/2020 0021   CL 101 05/16/2020 0021   CO2 20 (L) 05/16/2020 0021   GLUCOSE 182 (H) 05/16/2020 0021   BUN 9 05/16/2020 0021   CREATININE 0.80 05/16/2020 0021   CALCIUM 9.2 05/16/2020 0021   PROT 6.5 05/16/2020 0021   ALBUMIN 3.2 (L) 05/16/2020 0021   AST 49 (H) 05/16/2020 0021   ALT 32 05/16/2020 0021   ALKPHOS 30 (L) 05/16/2020 0021   BILITOT 0.9 05/16/2020 0021   GFRNONAA >60 05/16/2020 0021   GFRAA >60 05/16/2020 0021    Imaging I have reviewed images in epic and the results pertinent to this consultation are: 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 EEG consistent with PLEDs plus pattern suggesting high epileptogenicity.  Currently on levetiracetam, phenytoin, Onfi as well as propofol drip running at 80 mics per  KG per minute  Blood pressures remain in fairly decent control now.  Concern for spiking fever-but that was in the setting of warming blankets in place.  Exam is still consistent with encephalopathy which could simply be because of the PR ES but an underlying electrographic abnormality contributing to it cannot be completely ruled out.  Impression: Status epilepticus Posterior reversible encephalopathy syndrome Toxic metabolic encephalopathy  Recommendations: Continue current antiepileptics I would go up on sedation to achieve a burst suppression pattern on the EEG.  Will not go up on propofol because blood pressures are soft.  We will add Versed drip. Continued supportive care per primary team If there is another fever spike and there is no clear source for the fever, consider LP.  Plan discussed with Dr. Marchelle Gearing  -- Milon Dikes, MD [facility] Pager: [phone] If 7pm to 7am, please call on call as listed on AMION.  CRITICAL CARE  ATTESTATION Performed by: Milon Dikes, MD Total critical care time:  Critical care time was exclusive of separately billable procedures and treating other patients and/or supervising APPs/Residents/Students Critical care was necessary to treat or prevent imminent or life-threatening deterioration due to status epilepticus, toxic metabolic encephalopathy, posterior reversible encephalopathy syndrome This patient is critically ill and at significant risk for neurological worsening and/or death and care requires constant monitoring. Critical care was time spent personally by me on the following activities: development of treatment plan with patient and/or surrogate as well as nursing, discussions with consultants, evaluation of patient's response to treatment, examination of patient, obtaining history from patient or surrogate, ordering and performing treatments and interventions, ordering and review of laboratory studies, ordering  and review of radiographic studies, pulse oximetry, re-evaluation of patient's condition, participation in multidisciplinary rounds and medical decision making of high complexity in the care of this patient.

## 2020-05-17 DIAGNOSIS — J9600 Acute respiratory failure, unspecified whether with hypoxia or hypercapnia: Secondary | ICD-10-CM

## 2020-05-17 LAB — GLUCOSE, CAPILLARY
Glucose-Capillary: 145 mg/dL — ABNORMAL HIGH (ref 70–99)
Glucose-Capillary: 172 mg/dL — ABNORMAL HIGH (ref 70–99)
Glucose-Capillary: 106 mg/dL — ABNORMAL HIGH (ref 70–99)
Glucose-Capillary: 135 mg/dL — ABNORMAL HIGH (ref 70–99)
Glucose-Capillary: 149 mg/dL — ABNORMAL HIGH (ref 70–99)
Glucose-Capillary: 110 mg/dL — ABNORMAL HIGH (ref 70–99)
Glucose-Capillary: 98 mg/dL (ref 70–99)
Glucose-Capillary: 63 mg/dL — ABNORMAL LOW (ref 70–99)
Glucose-Capillary: 126 mg/dL — ABNORMAL HIGH (ref 70–99)

## 2020-05-17 LAB — RAPID URINE DRUG SCREEN, HOSP PERFORMED
Amphetamines: NOT DETECTED
Barbiturates: NOT DETECTED
Benzodiazepines: POSITIVE — AB
Cocaine: NOT DETECTED
Opiates: NOT DETECTED
Tetrahydrocannabinol: NOT DETECTED

## 2020-05-17 LAB — CBC
HCT: 28 % — ABNORMAL LOW (ref 36.0–46.0)
Hemoglobin: 9.3 g/dL — ABNORMAL LOW (ref 12.0–15.0)
MCH: 35.1 pg — ABNORMAL HIGH (ref 26.0–34.0)
MCHC: 33.2 g/dL (ref 30.0–36.0)
MCV: 105.7 fL — ABNORMAL HIGH (ref 80.0–100.0)
Platelets: 130 10*3/uL — ABNORMAL LOW (ref 150–400)
RBC: 2.65 MIL/uL — ABNORMAL LOW (ref 3.87–5.11)
RDW: 11.6 % (ref 11.5–15.5)
WBC: 9.2 10*3/uL (ref 4.0–10.5)
nRBC: 0 % (ref 0.0–0.2)

## 2020-05-17 LAB — COMPREHENSIVE METABOLIC PANEL
ALT: 24 U/L (ref 0–44)
AST: 26 U/L (ref 15–41)
Albumin: 2.1 g/dL — ABNORMAL LOW (ref 3.5–5.0)
Alkaline Phosphatase: 22 U/L — ABNORMAL LOW (ref 38–126)
Anion gap: 7 (ref 5–15)
BUN: 9 mg/dL (ref 6–20)
CO2: 18 mmol/L — ABNORMAL LOW (ref 22–32)
Calcium: 7.5 mg/dL — ABNORMAL LOW (ref 8.9–10.3)
Chloride: 112 mmol/L — ABNORMAL HIGH (ref 98–111)
Creatinine, Ser: 0.66 mg/dL (ref 0.44–1.00)
GFR calc Af Amer: >60 mL/min (ref >60)
GFR calc non Af Amer: >60 mL/min (ref >60)
Glucose, Bld: 167 mg/dL — ABNORMAL HIGH (ref 70–99)
Potassium: 4.3 mmol/L (ref 3.5–5.1)
Sodium: 137 mmol/L (ref 135–145)
Total Bilirubin: 0.5 mg/dL (ref 0.3–1.2)
Total Protein: 5.1 g/dL — ABNORMAL LOW (ref 6.5–8.1)

## 2020-05-17 LAB — FANA STAINING PATTERNS
Homogeneous Pattern: 1
Speckled Pattern: 24529

## 2020-05-17 LAB — ANTI-DNA ANTIBODY, DOUBLE-STRANDED: ds DNA Ab: <1 IU/mL (ref 0–9)

## 2020-05-17 LAB — URINE CULTURE: Culture: NO GROWTH

## 2020-05-17 LAB — LIPASE, BLOOD: Lipase: 24 U/L (ref 11–51)

## 2020-05-17 LAB — LACTIC ACID, PLASMA: Lactic Acid, Venous: 1.5 mmol/L (ref 0.5–1.9)

## 2020-05-17 LAB — ANTINUCLEAR ANTIBODIES, IFA: ANA Ab, IFA: POSITIVE — AB

## 2020-05-17 LAB — PHOSPHORUS: Phosphorus: 3.5 mg/dL (ref 2.5–4.6)

## 2020-05-17 LAB — MAGNESIUM: Magnesium: 1.8 mg/dL (ref 1.7–2.4)

## 2020-05-17 LAB — RHEUMATOID FACTOR: Rheumatoid fact SerPl-aCnc: 13.1 IU/mL (ref 0.0–13.9)

## 2020-05-17 LAB — ANTI-SCLERODERMA ANTIBODY: Scleroderma (Scl-70) (ENA) Antibody, IgG: <0.2 AI (ref 0.0–0.9)

## 2020-05-17 LAB — GLOMERULAR BASEMENT MEMBRANE ANTIBODIES: GBM Ab: 3 units (ref 0–20)

## 2020-05-17 LAB — HEPARIN INDUCED PLATELET AB (HIT ANTIBODY): Heparin Induced Plt Ab: 0.105 OD (ref 0.000–0.400)

## 2020-05-17 MED ORDER — MIDAZOLAM 50MG/50ML (1MG/ML) PREMIX INFUSION: 40.0000 mg/h | INTRAVENOUS | Status: DC

## 2020-05-17 MED ORDER — MIDAZOLAM BOLUS VIA INFUSION
10.0000 mg | Freq: Once | INTRAVENOUS | Status: AC
  Administered 2020-05-17: 10 mg via INTRAVENOUS
  Filled 2020-05-17: qty 10

## 2020-05-17 MED ORDER — MIDAZOLAM BOLUS VIA INFUSION
10.0000 mg | Freq: Once | INTRAVENOUS | Status: AC
  Administered 2020-05-17: 10 mg via INTRAVENOUS

## 2020-05-17 MED ORDER — ENOXAPARIN SODIUM 40 MG/0.4ML ~~LOC~~ SOLN
40.0000 mg | Freq: Every day | SUBCUTANEOUS | Status: DC
  Administered 2020-05-17 – 2020-05-30 (×14): 40 mg via SUBCUTANEOUS
  Filled 2020-05-17 (×14): qty 0.4

## 2020-05-17 MED ORDER — SODIUM CHLORIDE 0.9 % IV SOLN
90.0000 mg/h | INTRAVENOUS | Status: DC
  Administered 2020-05-17: 70 mg/h via INTRAVENOUS
  Administered 2020-05-17: 40 mg/h via INTRAVENOUS
  Administered 2020-05-18 – 2020-05-21 (×11): 90 mg/h via INTRAVENOUS
  Administered 2020-05-21: 50 mg/h via INTRAVENOUS
  Administered 2020-05-21: 90 mg/h via INTRAVENOUS
  Filled 2020-05-17 (×25): qty 100

## 2020-05-17 NOTE — Progress Notes (Signed)
Unable to get to patient for maint due to room full. Will check back in AM.

## 2020-05-17 NOTE — Progress Notes (Addendum)
EEG was reviewed earlier this evening with burst suppression not optimal at that time. Versed bolus 10 mg was given and rate was increased to 70.   EEG again reviewed at 11:50 PM. Trending towards an improved pattern, but still not optimal. Versed bolus of 10 mg is being given and rate is being increased to 80.   Addendum: EEG reviewed at 0230. Improved burst suppression pattern but still not optimal. Versed bolus of 10 mg is being given and rate is being increased to 90.   Electronically signed: Dr. Caryl Pina

## 2020-05-17 NOTE — Procedures (Signed)
Patient Name:Donna Short GMW:[id number] Epilepsy Attending:Myleka Moncure [name] Referring Physician/Provider:Dr Brooke Dare Duration:05/16/2020 [phone] to [id number]/[id number]  Patient history:29 year old female with history of alcohol abuse admitted with acute pancreatitis with hospital admission complicated with significantly elevated blood pressure, withdrawal, seizure and worsening mental status. EEG to evaluate for seizure.  Level of alertness:Awake/lethargic  AEDs during EEG study:LEV  Technical aspects: This EEG study was done with scalp electrodes positioned according to the 10-20 International system of electrode placement. Electrical activity was acquired at a sampling rate of 500Hz  and reviewed with a high frequency filter of 70Hz  and a low frequency filter of 1Hz . EEG data were recorded continuously and digitally stored.   Description:No posterior dominant rhythm was seen. EEG showed spikes and polyspikes in left posterior temporal region, maximal P7 at times periodic at 1-1.5 and rhythmic. There were also brief 1 seconds periods of eeg attenuation after a run of polyspikes.EEGalsoshowedgeneralizedand lateralized polymorphic3 to 6 Hz theta-delta slowing. After around 1430 on 05/16/2020, eeg showed bursts suppression with generalized 6-7seconds of eeg suppression and bursts of 2-5 second PLEDs. Gradually, periods of suppression became smaller and lasted 2-3 seconds.  Hyperventilation and photic stimulation were not performed.   ABNORMALITY -Periodic epileptiform discharges plus, left posterior temporal region - Bursts suppression with highly epileptiform discharges  IMPRESSION: This study initiallyshowed evidence of epileptogenicity arising fromleft posterior temporal region as well as cortical dysfunction in left hemisphere due to underlying structural abnormality, PRES. After 1430 on 05/16/2020, eeg showed bursts suppression with highly epileptiform discharges  consistent with severe diffuse encephalopathy. Gradually periods of suppression became shorter lasting 2-3seconds. Overall eeg appears to be improving compared to previous day but still has persistent epileptiform discharges which are favored to be ictal nature.   [name] 

## 2020-05-17 NOTE — Progress Notes (Addendum)
NAME:  ANILA BOJARSKI, MRN:  [id number], DOB:  November 01, 1990, LOS: 6 ADMISSION DATE:  05/10/2020, CONSULTATION DATE:  05/14/20 REFERRING MD:  Loney Loh, CHIEF COMPLAINT:  Encephalopathy   Brief History   Donna Short is a 29 y.o. female with a PMHx of EtOH abuse and HTN admitted to [facility] 9/17 for acute on chronic pancreatitis and EtOH withdrawal. During her hospital stay she developed uncontrolled HTN and breakthrough seizure activity with MRI findings consistent with PRES.   Past Medical History   has a past medical history of Asthma, Depression, History of chicken pox, Hypertension, Migraines, Pancreatitis (01/2018), and Seizures (HCC). Allergies  Allergen Reactions  . Claritin [Loratadine] Itching  . Peanut-Containing Drug Products Other (See Comments)  . Benadryl [Diphenhydramine Hcl (Sleep)] Rash  . Latex Rash  . Penicillins Itching and Rash    Has patient had a PCN reaction causing immediate rash, facial/tongue/throat swelling, SOB or lightheadedness with hypotension: Yes Has patient had a PCN reaction causing severe rash involving mucus membranes or skin necrosis: No Has patient had a PCN reaction that required hospitalization: Yes Has patient had a PCN reaction occurring within the last 10 years: No If all of the above answers are "NO", then may proceed with Cephalosporin use.;  . Shrimp [Shellfish Allergy] Hives and Rash     Significant Hospital Events   9/16 - admit 9/18, 9/19 breakthrough seizures 9/20- MRI showing PRES 9/21 -Patient had a hypoglycemic episode with BG 61 and shaking overnight consistent with previous episodes past 2 days. Symptoms responded well to 8 oz of juice and BG improved to 72 within 30 minutes. Likely due to poor oral intake intubation for burst suppression  9/22  - 9/22 - intubated yesterday for burst suppressio needs -> post intubation hypothermic/then febrile 102F with hypotension/pressor need. Off cleviprex -> this morning off pressors.   Continued hypoglycemia -> needing more D5. Not yet on TF. Making urine. Neuro Dr Wilford Corner concerned about recurrent PRES (milder in Jan 2021). Wants to consider LP if fever persists and culture negative. Currently on diprivan gtt and MAP at goal - 80-90  Consults:  Neurology- consulted 9/20, recommended maintaining strict BP control with MAP goals 80-90, Keppra 1g 2x daily for seizure control  Procedures:  ETT 9/21 PICC 9/22   Significant Diagnostic Tests:  9!6 = Abdominal CT- findings consistent with acute on chronic pancreatitis predominantly involving the proximal pancreas. No peripancreatic fluid collections are identified. EEG- 9/20, evidence of epileptogenicity arising from left posterior temporal region as well as cortical dysfunction in left hemisphere due to underlying structural abnormality, PRES MRI- 9/20, patchy/extensive brain edema in a pattern consistent with PRES 9/22 - echo and duplex - normal  Micro Data:  SARS 9/16 - neg Trach 9/23 -  Urine 9/22 - neg Blood 9/22 - neg so far MRSa PCR 9/20 -neg Autoimmune and vasculitis panel 9/22 >>  Antimicrobials:  Aztreoniam 9/22 (empiric sepsis)>> 9/22, Zosyn 9/22 >>  Interim history/subjective:   9/23  EEG with continued PLED per neuro. Versed increaed from /h to /h. MAx diprivan. On keppra On neop. On d5 normal saline. On TF. 40% fio2 on vent. ECHO normal. No DVT on duplex. Afebrile. MAP at 90 at goal  No labs in results today. AUtoimmune sent yestserday. UDS Sent yesterday  Off cleviprex  Objective   Blood pressure 104/81, pulse 85, temperature 97.6 F (36.4 C), temperature source Axillary, resp. rate 18, height  (1.575 m), weight 58.1 kg, SpO2 100 %.  Vent Mode: PRVC FiO2 (%):  [40 %] 40 % Set Rate:  [18 bmp] 18 bmp Vt Set:  [420 mL] 420 mL PEEP:  [5 cmH20] 5 cmH20 Plateau Pressure:  [12 cmH20-21 cmH20] 17 cmH20   Intake/Output Summary (Last 24 hours) at 05/17/2020 0943 Last data filed at 05/17/2020  0800 Gross per 24 hour  Intake 6032.79 ml  Output 200 ml  Net 5832.79 ml   Filed Weights   05/10/20 1131  Weight: 58.1 kg   General Appearance:  Looks criticall ill. EEEG ongoin Head:  Normocephalic, without obvious abnormality, atraumatic Eyes:  PERRL - x, conjunctiva/corneas - muddy     Ears:  Normal external ear canals, both ears Nose:  G tube - x Throat:  ETT TUBE - yes , OG tube - yes Neck:  Supple,  No enlargement/tenderness/nodules Lungs: Clear to auscultation bilaterally, Ventilator   Synchrony - yes Heart:  S1 and S2 normal, no murmur, CVP - x.  Pressors - neo Abdomen:  Soft, no masses, no organomegaly Genitalia / Rectal:  Not done Extremities:  Extremities- intact Skin:  ntact in exposed areas . 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 Worse compared to Jan 2021.  05/17/2020 - RASS -5. On Diprivan gtt and High dose versed gtt. EEG still with PLEDs => versed increased   P:   Per neuro EEG  Versed gtt and Diprivan gtt  for sedation and PLED conrol Awwaint Autoimmune and vasculitis profile   VASCULAR A:   Hypertensive all admission needing cleviprex Hypotensive post intubatioin 9/21 and ?  Sepsis - needing transient pressors  - MAP 90 with diprivan gtt and versed gtt high dose. Off cleviprex. Needing neo  P:  MAP goal 80-90 (currently there with diprivan gtt) Remove cleviprex off  MAR DC oral BP drugs off MAR Continue Neo   CARDIAC STRUCTURAL A: Normal heart on echo 2019  - repoeat echo 9/22 normal  P: Monitor  CARDIAC ELECTRICAL  A: In Sinus  QTC 472 msec  P: Monitor QTc prn -   INFECTIOUS A:   Hypothermic and then febrile 9/21-9/22 with lactic acidosis 3 MRSA PCR negative Lactic acodiss +  05/17/2020 - afebrle. PCT Normal  P:   Fluid bolus -> track lactate (results pending) Pan culture track PCT Empritic Zosyn Urine strep pending Urine leg pending LP based on course (per neuro)  RENAL A:  AKI resolved 9/17  P:  monitor  ELECTROLYTES A:  Lab pending 9/23   P: Replete both   GASTROINTESTINAL A:   Pancreatitis (acute on chronic ) - etoh 05/10/2020 - admit based on CT with lipase 101 at admit  05/17/2020 =- tolerating TF  P:   TF  HEMATOLOGIC   - HEME A:  AT risk anemia critical illness   P:  - PRBC for hgb </=  6.9gm%    - exceptions are   -  if ACS susepcted/confirmed then transfuse for hgb </= 8.0gm%,  or    -  active bleeding with hemodynamic instability, then transfuse regardless of hemoglobin value   At at all times try to transfuse 1 unit prbc as possible with exception of active hemorrhage   HEMATOLOGIC - Platelets A Baseline platelets 150-200. S/PLAN: thrombocytopenia 80s in [facility] 2021  Current - similar thrombocytopenia got worse with nadior 05/14/20 to 60 and then better to 93. On Lovenox. DUplex negative  P Continue lovenox Await HITT panel 9/22   ENDOCRINE A:   Hypoglycemia - needing D5NS  - resolved after TF    P:   Start TF rduce D5 NS from 125 to50cc/h SSI  MSK/DERM AT risk for Sacral deub  Plan  - nursing care   Best practice:  Diet: start TF Pain/Anxiety/Delirium protocol (if indicated): diprivan gtt + versed gtt VAP protocol (if indicated): N/a DVT prophylaxis: Lovenox GI prophylaxis: N/a Glucose control: ssi Mobility: bedrest Code Status: Full code Family  Communication:  FMLA form done 9/21. Mom Meryl Dare [phone]) updated 9/21 at bedside and over phone 05/16/20 and again 9/23 over phone  Disposition: Continued ICU care     ATTESTATION & SIGNATURE   The patient Donna Short is critically ill with multiple organ systems failure and requires high complexity decision making for assessment and support, frequent evaluation and titration of therapies, application of advanced monitoring technologies and extensive interpretation of multiple databases.   Critical Care Time devoted to patient care services described in this note is  45  Minutes. This time reflects time of care of this signee Dr Kalman Shan. This critical care time does not reflect procedure time, or teaching time or supervisory time of PA/NP/Med student/Med Resident etc but could involve care discussion time     Dr. Kalman Shan, M.D., [facility].C.P Pulmonary and Critical Care Medicine Staff Physician [facility] [place] [facility] Pager: [phone], If no answer or between  15:00h - 7:00h: call [phone]  05/17/2020 10:15 AM     LABS    PULMONARY Recent Labs  Lab 05/15/20 2205  PHART 7.533*  PCO2ART 34.6  PO2ART 589*  HCO3 29.7*  TCO2 31  O2SAT 100.0    CBC Recent Labs  Lab 05/14/20 0134 05/14/20 0134 05/15/20 0123 05/15/20 2205 05/16/20 0021  HGB 12.1   < > 12.5 11.2* 13.1  HCT 35.7*   < > 35.6* 33.0* 37.7  WBC 7.8  --  8.9  --  8.1  PLT 60*  --  80*  --  93*   < > = values in this interval not displayed.    COAGULATION No results for input(s): INR in the last 168 hours.  CARDIAC  No results for input(s): TROPONINI in the last 168 hours. No results for input(s): PROBNP in the last 168 hours.   CHEMISTRY Recent Labs  Lab 05/11/20 0410 05/11/20 0410 05/12/20 0331 05/12/20 0331 05/14/20 0134 05/14/20 0134 05/14/20 1444 05/14/20 1444 05/15/20 0123 05/15/20 0123 05/15/20 2205 05/16/20 0021    NA 135   < > 131*   < > 134*  --  133*  --  134*  --  138 133*  K 4.5   < > 3.9   < > 2.9*   < > 4.5   < > 3.6   < > 3.4* 3.2*  CL 100   < >  95*  --  96*  --  95*  --  97*  --   --  101  CO2 19*   < > 20*  --  22  --  22  --  22  --   --  20*  GLUCOSE 95   < > 149*  --  99  --  84  --  107*  --   --  182*  BUN 19   < > 11  --  13  --  13  --  11  --   --  9  CREATININE 1.19*  1.22*   < > 0.92  --  0.76  --  0.72  --  0.66  --   --  0.80  CALCIUM 8.3*   < > 9.3  --  9.1  --  9.7  --  9.6  --   --  9.2  MG 1.6*  --   --   --  1.5*  --   --   --   --   --   --  1.5*  PHOS 1.8*  --   --   --  3.2  --   --   --   --   --   --  4.1   < > = values in this interval not displayed.   Estimated Creatinine Clearance: 80.6 mL/min (by C-G formula based on SCr of 0.8 mg/dL).   LIVER Recent Labs  Lab 05/11/20 0410 05/12/20 0331 05/14/20 0134 05/15/20 0123 05/16/20 0021  AST 69* 58* 44* 49* 49*  ALT 42 40 28 31 32  ALKPHOS 45 44 31* 33* 30*  BILITOT 1.0 1.0 1.3* 0.5 0.9  PROT 7.7 7.9 6.7 7.0 6.5  ALBUMIN 3.9 3.9 3.1* 3.5 3.2*     INFECTIOUS Recent Labs  Lab 05/11/20 0410 05/15/20 1108 05/16/20 0946  LATICACIDVEN 1.0 3.6* 3.6*  PROCALCITON  --   --  <0.10     ENDOCRINE CBG (last 3)  Recent Labs    05/17/20 0238 05/17/20 0310 05/17/20 0727  GLUCAP 145* 172* 106*         IMAGING x48h  - image(s) personally visualized  -   highlighted in bold DG CHEST PORT 1 VIEW  Result Date: 05/15/2020 CLINICAL DATA:  Intubated, seizures EXAM: PORTABLE CHEST 1 VIEW COMPARISON:  05/10/2020 FINDINGS: Single frontal view of the chest demonstrates endotracheal tube overlying tracheal air column, approximately 1 cm above carina. Recommend retracting 1-1.5 cm. Enteric catheter passes below diaphragm tip excluded by collimation. Cardiac silhouette is unremarkable. No airspace disease, effusion, or pneumothorax. No acute bony abnormalities. IMPRESSION: 1. Endotracheal tube approximately 1 cm  above carina, recommend retracting 1-1.5 cm. 2. No acute intrathoracic process. Electronically Signed   By: Sharlet SalinaMichael  Brown M.D.   On: 05/15/2020 18:40   ECHOCARDIOGRAM COMPLETE  Result Date: 05/16/2020    ECHOCARDIOGRAM REPORT   Patient Name:   Donna CurlsSHAMIKA M Rung Date of Exam: 05/16/2020 Medical Rec #:  [id number]         Height:       62.0 in Accession #:    [id number]        Weight:       128.0 lb Date of Birth:  Oct 31, 1990         BSA:          1.581 m Patient Age:    29 years          BP:  110/81 mmHg Patient Gender: F                 HR:           95 bpm. Exam Location:  Inpatient Procedure: 2D Echo, Cardiac Doppler and Color Doppler Indications:    Acute respiratory failure.  History:        Patient has prior history of Echocardiogram examinations, most                 recent 01/17/2018. Abnormal ECG and Tachycardia,                 Signs/Symptoms:Dyspnea; Risk Factors:Hypertension. ETOH.                 Seizures.  Sonographer:    Sheralyn Boatman RDCS Referring Phys: 37 Naevia Unterreiner IMPRESSIONS  1. Left ventricular ejection fraction, by estimation, is 60 to 65%. The left ventricle has normal function. The left ventricle has no regional wall motion abnormalities. Indeterminate diastolic filling due to E-A fusion.  2. Right ventricular systolic function is normal. The right ventricular size is normal.  3. The mitral valve is normal in structure. Trivial mitral valve regurgitation.  4. The aortic valve is tricuspid. Aortic valve regurgitation is not visualized.  5. The inferior vena cava is normal in size with <50% respiratory variability, suggesting right atrial pressure of 8 mmHg. Comparison(s): No significant change from prior study. FINDINGS  Left Ventricle: Left ventricular ejection fraction, by estimation, is 60 to 65%. The left ventricle has normal function. The left ventricle has no regional wall motion abnormalities. The left ventricular internal cavity size was normal in size. There is   borderline left ventricular hypertrophy. Indeterminate diastolic filling due to E-A fusion. Right Ventricle: The right ventricular size is normal. No increase in right ventricular wall thickness. Right ventricular systolic function is normal. Left Atrium: Left atrial size was normal in size. Right Atrium: Right atrial size was normal in size. Pericardium: There is no evidence of pericardial effusion. Mitral Valve: The mitral valve is normal in structure. Trivial mitral valve regurgitation. Tricuspid Valve: The tricuspid valve is normal in structure. Tricuspid valve regurgitation is trivial. Aortic Valve: The aortic valve is tricuspid. Aortic valve regurgitation is not visualized. Pulmonic Valve: The pulmonic valve was normal in structure. Pulmonic valve regurgitation is trivial. Aorta: The aortic root and ascending aorta are structurally normal, with no evidence of dilitation. Venous: The inferior vena cava is normal in size with less than 50% respiratory variability, suggesting right atrial pressure of 8 mmHg. IAS/Shunts: No atrial level shunt detected by color flow Doppler.  LEFT VENTRICLE PLAX 2D LVIDd:         3.80 cm     Diastology LVIDs:         2.30 cm     LV e' medial:    8.59 cm/s LV PW:         1.30 cm     LV E/e' medial:  7.7 LV IVS:        1.40 cm     LV e' lateral:   7.29 cm/s LVOT diam:     2.00 cm     LV E/e' lateral: 9.1 LV SV:         53 LV SV Index:   34 LVOT Area:     3.14 cm  LV Volumes (MOD) LV vol d, MOD A2C: 70.2 ml LV vol d, MOD A4C: 62.2 ml LV vol s, MOD A2C: 24.6 ml  LV vol s, MOD A4C: 33.4 ml LV SV MOD A2C:     45.6 ml LV SV MOD A4C:     62.2 ml LV SV MOD BP:      39.1 ml RIGHT VENTRICLE             IVC RV S prime:     11.70 cm/s  IVC diam: 1.50 cm TAPSE (M-mode): 2.0 cm LEFT ATRIUM           Index       RIGHT ATRIUM           Index LA diam:      3.20 cm 2.02 cm/m  RA Area:     10.40 cm LA Vol (A2C): 29.5 ml 18.65 ml/m RA Volume:   22.70 ml  14.35 ml/m LA Vol (A4C): 25.8 ml 16.31 ml/m   AORTIC VALVE LVOT Vmax:   79.60 cm/s LVOT Vmean:  57.200 cm/s LVOT VTI:    0.169 m  AORTA Ao Root diam: 3.00 cm Ao Asc diam:  3.00 cm MITRAL VALVE MV Area (PHT): 6.32 cm    SHUNTS MV Decel Time: 120 msec    Systemic VTI:  0.17 m MV E velocity: 66.20 cm/s  Systemic Diam: 2.00 cm MV A velocity: 63.40 cm/s MV E/A ratio:  1.04 Laurance Flatten MD Electronically signed by Laurance Flatten MD Signature Date/Time: 05/16/2020/4:32:05 PM    Final    VAS [place] LOWER EXTREMITY VENOUS (DVT)  Result Date: 05/16/2020  Lower Venous DVTStudy Indications: Concern for HIT. Thrombocytopenia on Lovenox.  Limitations: Patient intubated; unable to cooperate and poor ultrasound/tissue interface. Comparison Study: No prior study Performing Technologist: Gertie Fey MHA, RDMS, RVT, RDCS  Examination Guidelines: A complete evaluation includes B-mode imaging, spectral Doppler, color Doppler, and power Doppler as needed of all accessible portions of each vessel. Bilateral testing is considered an integral part of a complete examination. Limited examinations for reoccurring indications may be performed as noted. The reflux portion of the exam is performed with the patient in reverse Trendelenburg.  +---------+---------------+---------+-----------+----------+--------------+ RIGHT    CompressibilityPhasicitySpontaneityPropertiesThrombus Aging +---------+---------------+---------+-----------+----------+--------------+ CFV      Full           Yes      Yes                                 +---------+---------------+---------+-----------+----------+--------------+ SFJ      Full                                                        +---------+---------------+---------+-----------+----------+--------------+ FV Prox  Full                                                        +---------+---------------+---------+-----------+----------+--------------+ FV Mid   Full                                                         +---------+---------------+---------+-----------+----------+--------------+  FV DistalFull                                                        +---------+---------------+---------+-----------+----------+--------------+ PFV      Full                                                        +---------+---------------+---------+-----------+----------+--------------+ POP      Full           Yes      Yes                                 +---------+---------------+---------+-----------+----------+--------------+ PTV      Full                                                        +---------+---------------+---------+-----------+----------+--------------+ PERO     Full                                                        +---------+---------------+---------+-----------+----------+--------------+   +---------+---------------+---------+-----------+----------+--------------+ LEFT     CompressibilityPhasicitySpontaneityPropertiesThrombus Aging +---------+---------------+---------+-----------+----------+--------------+ CFV      Full           Yes      Yes                                 +---------+---------------+---------+-----------+----------+--------------+ SFJ      Full                                                        +---------+---------------+---------+-----------+----------+--------------+ FV Prox  Full                                                        +---------+---------------+---------+-----------+----------+--------------+ FV Mid   Full                                                        +---------+---------------+---------+-----------+----------+--------------+ FV DistalFull                                                        +---------+---------------+---------+-----------+----------+--------------+   PFV      Full                                                         +---------+---------------+---------+-----------+----------+--------------+ POP      Full           Yes      Yes                                 +---------+---------------+---------+-----------+----------+--------------+ PTV      Full                                                        +---------+---------------+---------+-----------+----------+--------------+ PERO     Full                                                        +---------+---------------+---------+-----------+----------+--------------+     Summary: RIGHT: - There is no evidence of deep vein thrombosis in the lower extremity.  - No cystic structure found in the popliteal fossa.  LEFT: - There is no evidence of deep vein thrombosis in the lower extremity.  - No cystic structure found in the popliteal fossa.  *See table(s) above for measurements and observations. Electronically signed by Coral Else MD on 05/16/2020 at 9:33:44 PM.    Final    [place] EKG SITE RITE  Result Date: 05/16/2020 If Site Rite image not attached, placement could not be confirmed due to current cardiac rhythm.

## 2020-05-17 NOTE — Progress Notes (Signed)
Neurology Progress Note   S:// Seen and examined. Overnight EEG with continuing PLEDs and some burst suppression although not adequately burst suppress.  Awaiting formal LTM read from overnight No fevers overnight  O:// Current vital signs: BP 104/81   Pulse 85   Temp 97.6 F (36.4 C) (Axillary)   Resp 18   Ht 5' 2"  (1.575 m)   Wt 58.1 kg   SpO2 100%   BMI 23.41 kg/m  Vital signs in last 24 hours: Temp:  [96.7 F (35.9 C)-100.6 F (38.1 C)] 97.6 F (36.4 C) (09/23 0729) Pulse Rate:  [76-114] 85 (09/23 0830) Resp:  [14-28] 18 (09/23 0830) BP: (85-126)/(63-97) 104/81 (09/23 0830) SpO2:  [79 %-100 %] 100 % (09/23 0830) FiO2 (%):  [40 %] 40 % (09/23 0733) Neurological exam-limited on sedation Sedated with Versed and propofol Breathing with the ventilator Had weak gag overnight Pupils equal round reactive to light sluggishly Present corneal reflexes bilaterally No spontaneous movement No withdrawal to noxious stimulation  General: Sedated intubated HEENT: Normocephalic atraumatic with EEG leads on CVS: Regular rhythm Respiratory: Vented Extremities warm well perfused with trace edema.  Medications  Current Facility-Administered Medications:  .  0.9 %  sodium chloride infusion, , Intravenous, PRN, Caren Griffins, MD, Last Rate: 10 mL/hr at 05/17/20 0745, Rate Verify at 05/17/20 0745 .  0.9 %  sodium chloride infusion, 250 mL, Intravenous, Continuous, Aventura, Emily T, MD, Last Rate: 10 mL/hr at 05/16/20 0639, 250 mL at 05/16/20 0639 .  acetaminophen (TYLENOL) tablet 650 mg, 650 mg, Per Tube, Q6H PRN **OR** acetaminophen (TYLENOL) suppository 650 mg, 650 mg, Rectal, Q6H PRN, Ramaswamy, Murali, MD .  albuterol (VENTOLIN HFA) 108 (90 Base) MCG/ACT inhaler 1-2 puff, 1-2 puff, Inhalation, Q6H PRN, Chotiner, Yevonne Aline, MD .  chlorhexidine gluconate (MEDLINE KIT) (PERIDEX) 0.12 % solution 15 mL, 15 mL, Mouth Rinse, BID, Ramaswamy, Murali, MD, 15 mL at 05/17/20 0805 .   Chlorhexidine Gluconate Cloth 2 % PADS 6 each, 6 each, Topical, Daily, Caren Griffins, MD, 6 each at 05/16/20 0945 .  clevidipine (CLEVIPREX) infusion 0.5 mg/mL, 0-21 mg/hr, Intravenous, Continuous, Ramaswamy, Murali, MD .  cloBAZam (ONFI) tablet 5 mg, 5 mg, Per Tube, BID, Judd Lien, MD, 5 mg at 05/16/20 2148 .  dextrose 5 %-0.9 % sodium chloride infusion, , Intravenous, Continuous, Aventura, Emily T, MD, Last Rate: 125 mL/hr at 05/17/20 0745, Rate Verify at 05/17/20 0745 .  docusate (COLACE) 50 MG/5ML liquid 100 mg, 100 mg, Per Tube, BID, Brand Males, MD, 100 mg at 05/16/20 2149 .  enoxaparin (LOVENOX) injection 40 mg, 40 mg, Subcutaneous, QHS, Chotiner, Yevonne Aline, MD, 40 mg at 05/16/20 2150 .  feeding supplement (VITAL HIGH PROTEIN) liquid 1,000 mL, 1,000 mL, Per Tube, Continuous, Ramaswamy, Murali, MD, Last Rate: 45 mL/hr at 05/16/20 1051, 1,000 mL at 05/16/20 1051 .  fentaNYL (SUBLIMAZE) injection 50 mcg, 50 mcg, Intravenous, Q15 min PRN, Brand Males, MD .  folic acid (FOLVITE) tablet 1 mg, 1 mg, Per Tube, Daily, Ramaswamy, Murali, MD .  HYDROmorphone (DILAUDID) injection 1 mg, 1 mg, Intravenous, Q3H PRN, Chotiner, Yevonne Aline, MD, 1 mg at 05/14/20 0050 .  levETIRAcetam (KEPPRA) IVPB 1000 mg/100 mL premix, 1,000 mg, Intravenous, Q12H, Aventura, Emily T, MD, Last Rate: 400 mL/hr at 05/17/20 0805, 1,000 mg at 05/17/20 0805 .  LORazepam (ATIVAN) injection 2 mg, 2 mg, Intravenous, Once, Carmin Muskrat, MD .  MEDLINE mouth rinse, 15 mL, Mouth Rinse, 10 times per day, Brand Males, MD,  15 mL at 05/17/20 0639 .  midazolam (VERSED) injection 1-4 mg, 1-4 mg, Intravenous, Q2H PRN, Brand Males, MD, 4 mg at 05/16/20 1011 .  midazolam (VERSED) injection 2 mg, 2 mg, Intravenous, Q15 min PRN, Brand Males, MD, 2 mg at 05/15/20 2003 .  midazolam (VERSED) injection 2 mg, 2 mg, Intravenous, Q2H PRN, Brand Males, MD .  multivitamin with minerals tablet 1 tablet, 1  tablet, Per Tube, Daily, Genevive Bi, Shona Needles, MD, 1 tablet at 05/16/20 1033 .  ondansetron (ZOFRAN) tablet 4 mg, 4 mg, Per Tube, Q6H PRN **OR** ondansetron (ZOFRAN) injection 4 mg, 4 mg, Intravenous, Q6H PRN, Chase Caller, Murali, MD .  pantoprazole (PROTONIX) injection 40 mg, 40 mg, Intravenous, QHS, Ramaswamy, Murali, MD, 40 mg at 05/16/20 1654 .  phenylephrine (NEOSYNEPHRINE) 10-0.9 MG/250ML-% infusion, 25-200 mcg/min, Intravenous, Titrated, Aventura, Shona Needles, MD, Stopped at 05/17/20 0703 .  phenytoin (DILANTIN) injection 100 mg, 100 mg, Intravenous, Q8H, Bhagat, Srishti L, MD, 100 mg at 05/17/20 0640 .  piperacillin-tazobactam (ZOSYN) IVPB 3.375 g, 3.375 g, Intravenous, Q8H, Thea Gist, RPH, Stopped at 05/17/20 6294 .  polyethylene glycol (MIRALAX / GLYCOLAX) packet 17 g, 17 g, Per Tube, Daily, Ramaswamy, Murali, MD .  propofol (DIPRIVAN) 1000 MG/100ML infusion, 5-80 mcg/kg/min, Intravenous, Titrated, Ramaswamy, Murali, MD, Last Rate: 27.9 mL/hr at 05/17/20 0745, 80 mcg/kg/min at 05/17/20 0745 .  sodium chloride flush (NS) 0.9 % injection 10-40 mL, 10-40 mL, Intracatheter, Q12H, Ramaswamy, Murali, MD .  sodium chloride flush (NS) 0.9 % injection 10-40 mL, 10-40 mL, Intracatheter, PRN, Chase Caller, Murali, MD .  thiamine tablet 100 mg, 100 mg, Per Tube, Daily **OR** thiamine (B-1) injection 100 mg, 100 mg, Intravenous, Daily, Brand Males, MD Labs CBC    Component Value Date/Time   WBC 8.1 05/16/2020 0021   RBC 3.81 (L) 05/16/2020 0021   HGB 13.1 05/16/2020 0021   HCT 37.7 05/16/2020 0021   PLT 93 (L) 05/16/2020 0021   MCV 99.0 05/16/2020 0021   MCH 34.4 (H) 05/16/2020 0021   MCHC 34.7 05/16/2020 0021   RDW 10.7 (L) 05/16/2020 0021   LYMPHSABS 1.5 11/24/2019 1151   MONOABS 0.8 11/24/2019 1151   EOSABS 0.1 11/24/2019 1151   BASOSABS 0.0 11/24/2019 1151    CMP     Component Value Date/Time   NA 133 (L) 05/16/2020 0021   K 3.2 (L) 05/16/2020 0021   CL 101 05/16/2020 0021   CO2  20 (L) 05/16/2020 0021   GLUCOSE 182 (H) 05/16/2020 0021   BUN 9 05/16/2020 0021   CREATININE 0.80 05/16/2020 0021   CALCIUM 9.2 05/16/2020 0021   PROT 6.5 05/16/2020 0021   ALBUMIN 3.2 (L) 05/16/2020 0021   AST 49 (H) 05/16/2020 0021   ALT 32 05/16/2020 0021   ALKPHOS 30 (L) 05/16/2020 0021   BILITOT 0.9 05/16/2020 0021   GFRNONAA >60 05/16/2020 0021   GFRAA >60 05/16/2020 0021   Imaging I have reviewed images in epic and the results pertinent to this consultation are: MRI of the brain consistent with findings of 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  EEG consistent with a PLEDs plus pattern suggesting high epileptogenicity. Currently on levetiracetam, phenytoin, Onfi-with phenobarbital added yesterday. Also on sedation with Versed and propofol. Versed being uptitrated-up to 40 mg/h this morning. Propofol at 80 mcg/kg/min  Awaiting overnight LTM EEG read-approaching burst suppression but not adequately burst  suppress this time.  Goal would be to bur suppress for 24 to 48 hours and then wean off sedation while controlling the blood pressures as you are and reevaluate.  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 Would like to bur suppress for 24 to 48 hours and then wean sedation for better neurological assessment again at that time. Work-up for pancreatitis per primary team as you are.  Discussed plan with Dr. Chase Caller on the unit.  -- Amie Portland, MD [facility] Pager: [phone] If 7pm to 7am, please  call on call as listed on [facility].  Addendum EEG overnight read-were suppression with continuing highly ictal looking PLEDs. Plan updates: -Increase Versed -Continue LTM  We will continue to follow  -- Amie Portland, MD [facility] Pager: [phone] If 7pm to 7am, please call on call as listed on [facility].   CRITICAL CARE ATTESTATION Performed by: Amie Portland, MD Total critical care time: 40 minutes Critical care time was exclusive of separately billable procedures and treating other patients and/or supervising APPs/Residents/Students Critical care was necessary to treat or prevent imminent or life-threatening deterioration due to posterior reversible encephalopathy syndrome, status epilepticus This patient is critically ill and at significant risk for neurological worsening and/or death and care requires constant monitoring. Critical care was time spent personally by me on the following activities: development of treatment plan with patient and/or surrogate as well as nursing, discussions with consultants, evaluation of patient's response to treatment, examination of patient, obtaining history from patient or surrogate, ordering and performing treatments and interventions, ordering and review of laboratory studies, ordering and review of radiographic studies, pulse oximetry, re-evaluation of patient's condition, participation in multidisciplinary rounds and medical decision making of high complexity in the care of this patient.

## 2020-05-17 NOTE — Progress Notes (Signed)
EEG with continued PLEDs. Versed currently at a rate of 30. At maximum infusion rate of propofol. Bolusing Versed 10 mg IV followed by increased rate to 40 mg/hr.  Electronically signed: Dr. Caryl Pina

## 2020-05-18 ENCOUNTER — Inpatient Hospital Stay (HOSPITAL_COMMUNITY): Payer: Managed Care, Other (non HMO)

## 2020-05-18 LAB — TRIGLYCERIDES: Triglycerides: 530 mg/dL — ABNORMAL HIGH (ref <150)

## 2020-05-18 LAB — CK TOTAL AND CKMB (NOT AT ARMC)
CK, MB: 1.2 ng/mL (ref 0.5–5.0)
Relative Index: 0.8 (ref 0.0–2.5)
Total CK: 153 U/L (ref 38–234)

## 2020-05-18 LAB — CBC
HCT: 26.4 % — ABNORMAL LOW (ref 36.0–46.0)
Hemoglobin: 8.6 g/dL — ABNORMAL LOW (ref 12.0–15.0)
MCH: 35.1 pg — ABNORMAL HIGH (ref 26.0–34.0)
MCHC: 32.6 g/dL (ref 30.0–36.0)
MCV: 107.8 fL — ABNORMAL HIGH (ref 80.0–100.0)
Platelets: 155 10*3/uL (ref 150–400)
RBC: 2.45 MIL/uL — ABNORMAL LOW (ref 3.87–5.11)
RDW: 11.9 % (ref 11.5–15.5)
WBC: 7.8 10*3/uL (ref 4.0–10.5)
nRBC: 0 % (ref 0.0–0.2)

## 2020-05-18 LAB — PHOSPHORUS: Phosphorus: 3.8 mg/dL (ref 2.5–4.6)

## 2020-05-18 LAB — COMPREHENSIVE METABOLIC PANEL
ALT: 21 U/L (ref 0–44)
AST: 21 U/L (ref 15–41)
Albumin: 1.8 g/dL — ABNORMAL LOW (ref 3.5–5.0)
Alkaline Phosphatase: 20 U/L — ABNORMAL LOW (ref 38–126)
Anion gap: 9 (ref 5–15)
BUN: 10 mg/dL (ref 6–20)
CO2: 17 mmol/L — ABNORMAL LOW (ref 22–32)
Calcium: 7.3 mg/dL — ABNORMAL LOW (ref 8.9–10.3)
Chloride: 113 mmol/L — ABNORMAL HIGH (ref 98–111)
Creatinine, Ser: 0.68 mg/dL (ref 0.44–1.00)
GFR calc Af Amer: >60 mL/min (ref >60)
GFR calc non Af Amer: >60 mL/min (ref >60)
Glucose, Bld: 97 mg/dL (ref 70–99)
Potassium: 4 mmol/L (ref 3.5–5.1)
Sodium: 139 mmol/L (ref 135–145)
Total Bilirubin: 0.6 mg/dL (ref 0.3–1.2)
Total Protein: 4.8 g/dL — ABNORMAL LOW (ref 6.5–8.1)

## 2020-05-18 LAB — GLUCOSE, CAPILLARY
Glucose-Capillary: 104 mg/dL — ABNORMAL HIGH (ref 70–99)
Glucose-Capillary: 105 mg/dL — ABNORMAL HIGH (ref 70–99)
Glucose-Capillary: 115 mg/dL — ABNORMAL HIGH (ref 70–99)
Glucose-Capillary: 74 mg/dL (ref 70–99)
Glucose-Capillary: 75 mg/dL (ref 70–99)
Glucose-Capillary: 80 mg/dL (ref 70–99)
Glucose-Capillary: 85 mg/dL (ref 70–99)
Glucose-Capillary: 87 mg/dL (ref 70–99)
Glucose-Capillary: 90 mg/dL (ref 70–99)
Glucose-Capillary: 91 mg/dL (ref 70–99)
Glucose-Capillary: 95 mg/dL (ref 70–99)
Glucose-Capillary: 96 mg/dL (ref 70–99)

## 2020-05-18 LAB — LEGIONELLA PNEUMOPHILA SEROGP 1 UR AG: L. pneumophila Serogp 1 Ur Ag: NEGATIVE

## 2020-05-18 LAB — LACTIC ACID, PLASMA: Lactic Acid, Venous: 1 mmol/L (ref 0.5–1.9)

## 2020-05-18 LAB — PHENYTOIN LEVEL, TOTAL: Phenytoin Lvl: 15.7 ug/mL (ref 10.0–20.0)

## 2020-05-18 LAB — SJOGRENS SYNDROME-A EXTRACTABLE NUCLEAR ANTIBODY: SSA (Ro) (ENA) Antibody, IgG: <0.2 AI (ref 0.0–0.9)

## 2020-05-18 LAB — CULTURE, RESPIRATORY W GRAM STAIN: Culture: NORMAL

## 2020-05-18 LAB — MPO/PR-3 (ANCA) ANTIBODIES
ANCA Proteinase 3: <3.5 U/mL (ref 0.0–3.5)
Myeloperoxidase Abs: <9 U/mL (ref 0.0–9.0)

## 2020-05-18 LAB — SJOGRENS SYNDROME-B EXTRACTABLE NUCLEAR ANTIBODY: SSB (La) (ENA) Antibody, IgG: <0.2 AI (ref 0.0–0.9)

## 2020-05-18 LAB — MAGNESIUM: Magnesium: 1.5 mg/dL — ABNORMAL LOW (ref 1.7–2.4)

## 2020-05-18 MED ORDER — MAGNESIUM SULFATE 4 GM/100ML IV SOLN
4.0000 g | Freq: Once | INTRAVENOUS | Status: AC
  Administered 2020-05-18: 4 g via INTRAVENOUS
  Filled 2020-05-18: qty 100

## 2020-05-18 MED ORDER — MIDAZOLAM BOLUS VIA INFUSION
10.0000 mg | Freq: Once | INTRAVENOUS | Status: AC
  Administered 2020-05-18: 10 mg via INTRAVENOUS
  Filled 2020-05-18: qty 10

## 2020-05-18 MED ORDER — KETAMINE BOLUS VIA INFUSION
1.5000 mg/kg | Freq: Once | INTRAVENOUS | Status: AC
  Administered 2020-05-18: 90.45 mg via INTRAVENOUS
  Filled 2020-05-18: qty 95

## 2020-05-18 MED ORDER — SODIUM CHLORIDE 0.9 % IV SOLN
2.4000 mg/kg/h | INTRAVENOUS | Status: DC
  Administered 2020-05-18 – 2020-05-19 (×2): 1.2 mg/kg/h via INTRAVENOUS
  Filled 2020-05-18 (×3): qty 5

## 2020-05-18 MED ORDER — VITAL HIGH PROTEIN PO LIQD: 1000.0000 mL | ORAL | Status: DC

## 2020-05-18 MED ORDER — PROSOURCE TF PO LIQD
45.0000 mL | Freq: Four times a day (QID) | ORAL | Status: DC
  Administered 2020-05-18 – 2020-05-21 (×13): 45 mL
  Filled 2020-05-18 (×13): qty 45

## 2020-05-18 MED ORDER — DEXTROSE 10 % IV SOLN
INTRAVENOUS | Status: DC
  Administered 2020-05-19: 1 mL via INTRAVENOUS

## 2020-05-18 MED ORDER — VITAL HIGH PROTEIN PO LIQD
1000.0000 mL | ORAL | Status: DC
  Administered 2020-05-18 – 2020-05-20 (×3): 1000 mL
  Filled 2020-05-18: qty 3000

## 2020-05-18 NOTE — Progress Notes (Signed)
eLink Physician-Brief Progress Note Patient Name: Donna Short DOB: 09-24-1990 MRN: [id number]   Date of Service  05/18/2020  HPI/Events of Note  Patient was down in MRI for a study of her brain, radiologist noticed a cervical spine abnormality while she was on the MRI table.  eICU Interventions  MRI of the C-spine ordered.        Thomasene Lot Jermie Hippe 05/18/2020, 11:55 PM

## 2020-05-18 NOTE — Progress Notes (Signed)
Phenytoin level 15.7 mcg/mL but given low albumin it corrects to 34.1 mcg/mL. Spoke with Dr. Wilford Corner, ok to hold phenytoin today and recheck level tomorrow.  Trixie Rude, PharmD PGY1 Acute Care Pharmacy Resident 05/18/2020 9:09 AM  Please check [email or web address] for unit-specific pharmacy phone numbers.

## 2020-05-18 NOTE — Progress Notes (Signed)
NAME:  Donna Short, MRN:  [id number], DOB:  29-May-1991, LOS: 7 ADMISSION DATE:  05/10/2020, CONSULTATION DATE:  05/14/20 REFERRING MD:  Loney Loh, CHIEF COMPLAINT:  Encephalopathy   Brief History   Donna Short is a 29 y.o. female with a PMHx of EtOH abuse and HTN admitted to [facility] 9/17 for acute on chronic pancreatitis and EtOH withdrawal. During her hospital stay she developed uncontrolled HTN and breakthrough seizure activity with MRI findings consistent with PRES.   Past Medical History   has a past medical history of Asthma, Depression, History of chicken pox, Hypertension, Migraines, Pancreatitis (01/2018), and Seizures (HCC). Allergies  Allergen Reactions  . Claritin [Loratadine] Itching  . Peanut-Containing Drug Products Other (See Comments)  . Benadryl [Diphenhydramine Hcl (Sleep)] Rash  . Latex Rash  . Penicillins Itching and Rash    Has patient had a PCN reaction causing immediate rash, facial/tongue/throat swelling, SOB or lightheadedness with hypotension: Yes Has patient had a PCN reaction causing severe rash involving mucus membranes or skin necrosis: No Has patient had a PCN reaction that required hospitalization: Yes Has patient had a PCN reaction occurring within the last 10 years: No If all of the above answers are "NO", then may proceed with Cephalosporin use.;  . Shrimp [Shellfish Allergy] Hives and Rash     Significant Hospital Events   9/16 - admit 9/18, 9/19 breakthrough seizures 9/20- MRI showing PRES 9/21 -Patient had a hypoglycemic episode with BG 61 and shaking overnight consistent with previous episodes past 2 days. Symptoms responded well to 8 oz of juice and BG improved to 72 within 30 minutes. Likely due to poor oral intake intubation for burst suppression  9/22  - 9/22 - intubated yesterday for burst suppressio needs -> post intubation hypothermic/then febrile 102F with hypotension/pressor need. Off cleviprex -> this morning off pressors.   Continued hypoglycemia -> needing more D5. Not yet on TF. Making urine. Neuro Dr Wilford Corner concerned about recurrent PRES (milder in Jan 2021). Wants to consider LP if fever persists and culture negative. Currently on diprivan gtt and MAP at goal - 80-90  9/23 -  9/23  EEG with continued PLED per neuro. Versed increaed from 30mg /h to 40mg /h. MAx diprivan. On keppra On neop. On d5 normal saline. On TF. 40% fio2 on vent. ECHO normal. No DVT on duplex. Afebrile. MAP at 90 at goal. No labs in results today. AUtoimmune sent yestserday. UDS Sent yesterday. Off cleviprex  Consults:  Neurology- consulted 9/20, recommended maintaining strict BP control with MAP goals 80-90, Keppra 1g 2x daily for seizure control  Procedures:  ETT 9/21 PICC 9/22   Significant Diagnostic Tests:  9!6 = Abdominal CT- findings consistent with acute on chronic pancreatitis predominantly involving the proximal pancreas. No peripancreatic fluid collections are identified. EEG- 9/20, evidence of epileptogenicity arising from left posterior temporal region as well as cortical dysfunction in left hemisphere due to underlying structural abnormality, PRES MRI- 9/20, patchy/extensive brain edema in a pattern consistent with PRES 9/22 - echo and duplex - normal  Micro Data:  SARS 9/16 - neg Trach 9/23 - nl flora Urine 9/22 - neg Blood 9/22 - neg so far MRSa PCR 9/20 -neg Autoimmune and vasculitis panel 9/22 >> ANA weak positive but  Otherwise neg U Tox 9/22 - positive benzo (Versed infusion)  Antimicrobials:  Aztreoniam 9/22 (empiric sepsis)>> 9/22, Zosyn 9/22 >>  Interim history/subjective:    05/18/2020 -on 40% FiO2.  On ventilator.  On low-dose Neo-Synephrine to keep the  MAP between 80 and 90.  Not needing Cleviprex.  Has increased Versed needs.  Currently on Versed 90 mg/h and propofol 90 mcg.  Autoimmune profile negative except for weak positive ANA.  Has hypoglycemia despite tube feeds.  Started on D10  -infusion  Objective   Blood pressure 103/73, pulse 99, temperature 98 F (36.7 C), temperature source Oral, resp. rate 18, height 5\' 2"  (1.575 m), weight 60.3 kg, SpO2 100 %.    Vent Mode: PRVC FiO2 (%):  [40 %] 40 % Set Rate:  [18 bmp] 18 bmp Vt Set:  [420 mL-435 mL] 420 mL PEEP:  [5 cmH20] 5 cmH20 Plateau Pressure:  [17 cmH20-20 cmH20] 20 cmH20   Intake/Output Summary (Last 24 hours) at 05/18/2020 1203 Last data filed at 05/18/2020 1100 Gross per 24 hour  Intake 5307.49 ml  Output 2460 ml  Net 2847.49 ml   Filed Weights   05/10/20 1131 05/18/20 0545  Weight: 58.1 kg 60.3 kg   Critically ill looking young lady.  Deeply sedated on the ventilator.  RASS sedation score -5.  Synchronous with the ventilator at 40%.  Has a 05/20/20 on clear to auscultation bilaterally normal heart sounds.  Abdomen soft.  Tolerating tube feeds.        Resolved Hospital Problem list   AKI- initially presented with Cr 1.4, baseline .7-.9, normalized following administration of IV fluids    Assessment & Plan:     ASSESSMENT / PLAN:  PULMONARY  A:  Acute Resp Failure - s/p intubation for burst suppression 05/14/20  05/18/2020 - > does not meet criteria for spontaneous breathing trial extubation in the setting of acute encephalopathy  P:   Full mechanical ventilator support VAP protocool   NEUROLOGIC A:   PRES recurrence with seizures 9/19., Confirmed on MRI 9/20 . Worse compared to Jan 2021.  05/18/2020 - RASS -5. On Diprivan gtt and High dose versed gtt. EEG still with PLEDs => versed increased again to 90 mg/h.Dilantin level hight   P:   Per neuro EEG  Versed gtt and Diprivan gtt  for sedation and PLED conrol Neuro holding dilantin   VASCULAR A:   Hypertensive all admission needing cleviprex Hypotensive post intubatioin 9/21 and ?  Sepsis - needing transient pressors  -Mean arterial pressure at goal between 80 and 90 with the help of Neo-Synephrine.  Not needing  Cleviprex  P:  MAP goal 80-90 (currently there with diprivan gtt and neo-) Continue Neo   CARDIAC STRUCTURAL A: Normal heart on echo 2019.  Repeat echocardiogram May 16, 2020 is normal    P: Monitor  CARDIAC ELECTRICAL  A: In Sinus  QTC 472 msec on May 16, 2020  P: Monitor QTc prn -   INFECTIOUS A:   Hypothermic and then febrile 9/21-9/22 with lactic acidosis 3 MRSA PCR negative.  Urine strep negative Lactic acodiss + -resolved 05/17/2020  05/18/2020 - afebrle. PCT Normal.  Lactic acid normal  P:   Pan culture track PCT Empritic Zosyn to continue Urine leg pending MR or LP based on course (per neuro)  RENAL A:  AKI resolved 9/17  P:  monitor  ELECTROLYTES A:  Hypomagnesemia   P: Replete magnesium   GASTROINTESTINAL A:   Pancreatitis (acute on chronic ) - etoh 05/10/2020 - admit based on CT with lipase 101 at admit  05/18/2020 =- tolerating TF  P:   TF  HEMATOLOGIC   - HEME A:  AT risk anemia critical illness   P:  -  PRBC for hgb </= 6.9gm%    - exceptions are   -  if ACS susepcted/confirmed then transfuse for hgb </= 8.0gm%,  or    -  active bleeding with hemodynamic instability, then transfuse regardless of hemoglobin value   At at all times try to transfuse 1 unit prbc as possible with exception of active hemorrhage   HEMATOLOGIC - Platelets A Thrombocytopenia [Baseline platelets 150-200; thrombocytopenia 80s in [facility] 2021)  -Transient May 14, 2020-May 18, 2020.  Duplex negative.  HIT antibody panel - 05/16/2020  P Continue lovenox    ENDOCRINE A:   Hypoglycemia - needing D5NS  -Ongoing despite tube feeds on 05/18/2020 P:   Continue TF Change D5->D10 SSI  MSK/DERM AT risk for Sacral deub At risk for rhabdo due to dipriva  05/18/2020  CK nl, lacate nla  Plan  - nursing care - monitor CK and lactate as needed  Best practice:  Diet: start TF Pain/Anxiety/Delirium protocol (if indicated):  diprivan gtt + versed gtt VAP protocol (if indicated): N/a DVT prophylaxis: Lovenox GI prophylaxis: N/a Glucose control: ssi Mobility: bedrest Code Status: Full code Family Communication:  FMLA form done 9/21. Mom Meryl Dare [phone]) updated 9/21 at bedside and over phone 05/16/20 and again 9/23 over phone  Disposition: Continued ICU care     ATTESTATION & SIGNATURE   The patient Donna Short is critically ill with multiple organ systems failure and requires high complexity decision making for assessment and support, frequent evaluation and titration of therapies, application of advanced monitoring technologies and extensive interpretation of multiple databases.   Critical Care Time devoted to patient care services described in this note is  45  Minutes. This time reflects time of care of this signee Dr Kalman Shan. This critical care time does not reflect procedure time, or teaching time or supervisory time of PA/NP/Med student/Med Resident etc but could involve care discussion time     Dr. Kalman Shan, M.D., [facility].C.P Pulmonary and Critical Care Medicine Staff Physician [facility] Pager: [phone], If no answer or between  15:00h - 7:00h: call [phone]  05/18/2020 12:03 PM     LABS    PULMONARY Recent Labs  Lab 05/15/20 2205  PHART 7.533*  PCO2ART 34.6  PO2ART 589*  HCO3 29.7*  TCO2 31  O2SAT 100.0    CBC Recent Labs  Lab 05/16/20 0021 05/17/20 1004 05/18/20 0540  HGB 13.1 9.3* 8.6*  HCT 37.7 28.0* 26.4*  WBC 8.1 9.2 7.8  PLT 93* 130* 155    COAGULATION No results for input(s): INR in the last 168 hours.  CARDIAC  No results for input(s): TROPONINI in the last 168 hours. No results for input(s): PROBNP in the last 168 hours.   CHEMISTRY Recent Labs  Lab 05/14/20 0134 05/14/20 0134 05/14/20 1444 05/14/20 1444 05/15/20 0123 05/15/20 0123 05/15/20 2205 05/15/20 2205  05/16/20 0021 05/16/20 0021 05/17/20 1004 05/18/20 0540  NA 134*   < > 133*   < > 134*  --  138  --  133*  --  137 139  K 2.9*   < > 4.5   < > 3.6   < > 3.4*   < > 3.2*   < > 4.3 4.0  CL 96*   < > 95*  --  97*  --   --   --  101  --  112* 113*  CO2 22   < >  22  --  22  --   --   --  20*  --  18* 17*  GLUCOSE 99   < > 84  --  107*  --   --   --  182*  --  167* 97  BUN 13   < > 13  --  11  --   --   --  9  --  9 10  CREATININE 0.76   < > 0.72  --  0.66  --   --   --  0.80  --  0.66 0.68  CALCIUM 9.1   < > 9.7  --  9.6  --   --   --  9.2  --  7.5* 7.3*  MG 1.5*  --   --   --   --   --   --   --  1.5*  --  1.8 1.5*  PHOS 3.2  --   --   --   --   --   --   --  4.1  --  3.5 3.8   < > = values in this interval not displayed.   Estimated Creatinine Clearance: 87.2 mL/min (by C-G formula based on SCr of 0.68 mg/dL).   LIVER Recent Labs  Lab 05/14/20 0134 05/15/20 0123 05/16/20 0021 05/17/20 1004 05/18/20 0540  AST 44* 49* 49* 26 21  ALT 28 31 32 24 21  ALKPHOS 31* 33* 30* 22* 20*  BILITOT 1.3* 0.5 0.9 0.5 0.6  PROT 6.7 7.0 6.5 5.1* 4.8*  ALBUMIN 3.1* 3.5 3.2* 2.1* 1.8*     INFECTIOUS Recent Labs  Lab 05/16/20 0946 05/17/20 1004 05/18/20 0540  LATICACIDVEN 3.6* 1.5 1.0  PROCALCITON <0.10  --   --      ENDOCRINE CBG (last 3)  Recent Labs    05/18/20 0537 05/18/20 0742 05/18/20 0743  GLUCAP 91 75 74         IMAGING x48h  - image(s) personally visualized  -   highlighted in bold DG CHEST PORT 1 VIEW  Result Date: 05/18/2020 CLINICAL DATA:  Respiratory failure EXAM: PORTABLE CHEST 1 VIEW COMPARISON:  05/15/2020 FINDINGS: Endotracheal tube is unchanged, 16 mm above the carina. Nasogastric tube extends into the upper abdomen beyond the margin of the examination. Right upper extremity PICC line is been placed with its tip within the superior cavoatrial junction. There has developed left lower lobe collapse. Small left pleural effusion may be present. Right lung is  clear. No pneumothorax. No pleural effusion on the right. IMPRESSION: Interval development of left lower lobe collapse. Possible small left pleural effusion. Stable support tubes. Right upper extremity PICC line now identified, tip within the superior cavoatrial junction. Electronically Signed   By: Helyn NumbersAshesh  Parikh MD   On: 05/18/2020 06:44   ECHOCARDIOGRAM COMPLETE  Result Date: 05/16/2020    ECHOCARDIOGRAM REPORT   Patient Name:   Romona CurlsSHAMIKA M Shadduck Date of Exam: 05/16/2020 Medical Rec #:  [id number]         Height:       62.0 in Accession #:    [id number]        Weight:       128.0 lb Date of Birth:  [birth year]         BSA:          1.581 m Patient Age:    29 years          BP:  110/81 mmHg Patient Gender: F                 HR:           95 bpm. Exam Location:  Inpatient Procedure: 2D Echo, Cardiac Doppler and Color Doppler Indications:    Acute respiratory failure.  History:        Patient has prior history of Echocardiogram examinations, most                 recent 01/17/2018. Abnormal ECG and Tachycardia,                 Signs/Symptoms:Dyspnea; Risk Factors:Hypertension. ETOH.                 Seizures.  Sonographer:    Sheralyn Boatman RDCS Referring Phys: [id number] Shakir Petrosino IMPRESSIONS  1. Left ventricular ejection fraction, by estimation, is 60 to 65%. The left ventricle has normal function. The left ventricle has no regional wall motion abnormalities. Indeterminate diastolic filling due to E-A fusion.  2. Right ventricular systolic function is normal. The right ventricular size is normal.  3. The mitral valve is normal in structure. Trivial mitral valve regurgitation.  4. The aortic valve is tricuspid. Aortic valve regurgitation is not visualized.  5. The inferior vena cava is normal in size with <50% respiratory variability, suggesting right atrial pressure of 8 mmHg. Comparison(s): No significant change from prior study. FINDINGS  Left Ventricle: Left ventricular ejection fraction, by estimation, is  60 to 65%. The left ventricle has normal function. The left ventricle has no regional wall motion abnormalities. The left ventricular internal cavity size was normal in size. There is  borderline left ventricular hypertrophy. Indeterminate diastolic filling due to E-A fusion. Right Ventricle: The right ventricular size is normal. No increase in right ventricular wall thickness. Right ventricular systolic function is normal. Left Atrium: Left atrial size was normal in size. Right Atrium: Right atrial size was normal in size. Pericardium: There is no evidence of pericardial effusion. Mitral Valve: The mitral valve is normal in structure. Trivial mitral valve regurgitation. Tricuspid Valve: The tricuspid valve is normal in structure. Tricuspid valve regurgitation is trivial. Aortic Valve: The aortic valve is tricuspid. Aortic valve regurgitation is not visualized. Pulmonic Valve: The pulmonic valve was normal in structure. Pulmonic valve regurgitation is trivial. Aorta: The aortic root and ascending aorta are structurally normal, with no evidence of dilitation. Venous: The inferior vena cava is normal in size with less than 50% respiratory variability, suggesting right atrial pressure of 8 mmHg. IAS/Shunts: No atrial level shunt detected by color flow Doppler.  LEFT VENTRICLE PLAX 2D LVIDd:         3.80 cm     Diastology LVIDs:         2.30 cm     LV e' medial:    8.59 cm/s LV PW:         1.30 cm     LV E/e' medial:  7.7 LV IVS:        1.40 cm     LV e' lateral:   7.29 cm/s LVOT diam:     2.00 cm     LV E/e' lateral: 9.1 LV SV:         53 LV SV Index:   34 LVOT Area:     3.14 cm  LV Volumes (MOD) LV vol d, MOD A2C: 70.2 ml LV vol d, MOD A4C: 62.2 ml LV vol s, MOD A2C: 24.6 ml  LV vol s, MOD A4C: 33.4 ml LV SV MOD A2C:     45.6 ml LV SV MOD A4C:     62.2 ml LV SV MOD BP:      39.1 ml RIGHT VENTRICLE             IVC RV S prime:     11.70 cm/s  IVC diam: 1.50 cm TAPSE (M-mode): 2.0 cm LEFT ATRIUM           Index        RIGHT ATRIUM           Index LA diam:      3.20 cm 2.02 cm/m  RA Area:     10.40 cm LA Vol (A2C): 29.5 ml 18.65 ml/m RA Volume:   22.70 ml  14.35 ml/m LA Vol (A4C): 25.8 ml 16.31 ml/m  AORTIC VALVE LVOT Vmax:   79.60 cm/s LVOT Vmean:  57.200 cm/s LVOT VTI:    0.169 m  AORTA Ao Root diam: 3.00 cm Ao Asc diam:  3.00 cm MITRAL VALVE MV Area (PHT): 6.32 cm    SHUNTS MV Decel Time: 120 msec    Systemic VTI:  0.17 m MV E velocity: 66.20 cm/s  Systemic Diam: 2.00 cm MV A velocity: 63.40 cm/s MV E/A ratio:  1.04 Laurance Flatten MD Electronically signed by Laurance Flatten MD Signature Date/Time: 05/16/2020/4:32:05 PM    Final

## 2020-05-18 NOTE — Progress Notes (Signed)
eLink Physician-Brief Progress Note Patient Name: Donna Short DOB: September 28, 1990 MRN: [id number]   Date of Service  05/18/2020  HPI/Events of Note  Triglycerides 530, intractable seizures.  eICU Interventions  Propofol discontinued and Ketamine started by Neurology.        Thomasene Lot Joyce Leckey 05/18/2020, 10:19 PM

## 2020-05-18 NOTE — Progress Notes (Signed)
Neurology Progress Note   S:// Seen and examined. Overnight EEG with continuing PLEDs and some burst suppression although not adequately burst suppress.  Awaiting formal LTM read from overnight Maintaining SBP in normal ranges. Hypoglycemic events overnight Up to Versed at 90/h and propofol at 80/h - after multiple versed boluses to attain BS pattern on EEG  O:// Current vital signs: BP 104/77   Pulse 92   Temp 98.5 F (36.9 C) (Oral)   Resp 18   Ht _0  (1.575 m)   Wt 60.3 kg   SpO2 100%   BMI 24.31 kg/m  Vital signs in last 24 hours: Temp:  [97.5 F (36.4 C)-98.8 F (37.1 C)] 98.5 F (36.9 C) (09/24 0716) Pulse Rate:  [80-104] 92 (09/24 0800) Resp:  [17-19] 18 (09/24 0800) BP: (88-128)/(62-99) 104/77 (09/24 0645) SpO2:  [99 %-100 %] 100 % (09/24 0645) FiO2 (%):  [40 %] 40 % (09/24 0800) Weight:  [60.3 kg] 60.3 kg (09/24 0545) Neurological exam-limited on sedation Sedated with Versed and propofol Breathing with the ventilator Had weak gag overnight Pupils equal round reactive to light  Difficult to elicit corneal reflexes bilaterally No spontaneous movement No withdrawal to noxious stimulation  General: Sedated intubated HEENT: Normocephalic atraumatic with EEG leads on CVS: Regular rhythm Respiratory: Vented Extremities warm well perfused with trace edema.  Medications  Current Facility-Administered Medications:  .  0.9 %  sodium chloride infusion, , Intravenous, PRN, Caren Griffins, MD, Stopped at 05/17/20 2145 .  0.9 %  sodium chloride infusion, 250 mL, Intravenous, Continuous, Aventura, Emily T, MD, Last Rate: 10 mL/hr at 05/16/20 0639, 250 mL at 05/16/20 0639 .  acetaminophen (TYLENOL) tablet 650 mg, 650 mg, Per Tube, Q6H PRN **OR** acetaminophen (TYLENOL) suppository 650 mg, 650 mg, Rectal, Q6H PRN, Ramaswamy, Murali, MD .  albuterol (VENTOLIN HFA) 108 (90 Base) MCG/ACT inhaler 1-2 puff, 1-2 puff, Inhalation, Q6H PRN, Chotiner, Yevonne Aline, MD .   chlorhexidine gluconate (MEDLINE KIT) (PERIDEX) 0.12 % solution 15 mL, 15 mL, Mouth Rinse, BID, Ramaswamy, Murali, MD, 15 mL at 05/18/20 0815 .  Chlorhexidine Gluconate Cloth 2 % PADS 6 each, 6 each, Topical, Daily, Caren Griffins, MD, 6 each at 05/17/20 1748 .  cloBAZam (ONFI) tablet 5 mg, 5 mg, Per Tube, BID, Judd Lien, MD, 5 mg at 05/17/20 2126 .  dextrose 5 %-0.9 % sodium chloride infusion, , Intravenous, Continuous, Ramaswamy, Murali, MD, Last Rate: 50 mL/hr at 05/18/20 0600, Rate Verify at 05/18/20 0600 .  docusate (COLACE) 50 MG/5ML liquid 100 mg, 100 mg, Per Tube, BID, Brand Males, MD, 100 mg at 05/17/20 1035 .  enoxaparin (LOVENOX) injection 40 mg, 40 mg, Subcutaneous, QHS, Ramaswamy, Murali, MD, 40 mg at 05/17/20 2125 .  feeding supplement (VITAL HIGH PROTEIN) liquid 1,000 mL, 1,000 mL, Per Tube, Continuous, Ramaswamy, Murali, MD, Last Rate: 45 mL/hr at 05/17/20 1157, 1,000 mL at 05/17/20 1157 .  fentaNYL (SUBLIMAZE) injection 50 mcg, 50 mcg, Intravenous, Q15 min PRN, Brand Males, MD .  folic acid (FOLVITE) tablet 1 mg, 1 mg, Per Tube, Daily, Brand Males, MD, 1 mg at 05/17/20 1035 .  HYDROmorphone (DILAUDID) injection 1 mg, 1 mg, Intravenous, Q3H PRN, Chotiner, Yevonne Aline, MD, 1 mg at 05/14/20 0050 .  levETIRAcetam (KEPPRA) IVPB 1000 mg/100 mL premix, 1,000 mg, Intravenous, Q12H, Aventura, Emily T, MD, Last Rate: 400 mL/hr at 05/18/20 0815, 1,000 mg at 05/18/20 0815 .  LORazepam (ATIVAN) injection 2 mg, 2 mg, Intravenous, Once, Carmin Muskrat, MD .  MEDLINE mouth rinse, 15 mL, Mouth Rinse, 10 times per day, Brand Males, MD, 15 mL at 05/18/20 0657 .  midazolam (VERSED) 500 mg in sodium chloride 0.9 % 500 mL (1 mg/mL) infusion, 90 mg/hr, Intravenous, Continuous, Kerney Elbe, MD, Last Rate: 90 mL/hr at 05/18/20 0600, 90 mg/hr at 05/18/20 0600 .  midazolam (VERSED) injection 1-4 mg, 1-4 mg, Intravenous, Q2H PRN, Brand Males, MD, 4 mg at 05/16/20 1011 .   midazolam (VERSED) injection 2 mg, 2 mg, Intravenous, Q15 min PRN, Brand Males, MD, 2 mg at 05/15/20 2003 .  midazolam (VERSED) injection 2 mg, 2 mg, Intravenous, Q2H PRN, Brand Males, MD .  multivitamin with minerals tablet 1 tablet, 1 tablet, Per Tube, Daily, Genevive Bi, Shona Needles, MD, 1 tablet at 05/17/20 1035 .  ondansetron (ZOFRAN) tablet 4 mg, 4 mg, Per Tube, Q6H PRN **OR** ondansetron (ZOFRAN) injection 4 mg, 4 mg, Intravenous, Q6H PRN, Chase Caller, Murali, MD .  pantoprazole (PROTONIX) injection 40 mg, 40 mg, Intravenous, QHS, Ramaswamy, Murali, MD, 40 mg at 05/17/20 2125 .  phenylephrine (NEOSYNEPHRINE) 10-0.9 MG/250ML-% infusion, 25-200 mcg/min, Intravenous, Titrated, Aventura, Emily T, MD, Last Rate: 37.5 mL/hr at 05/18/20 0600, 25 mcg/min at 05/18/20 0600 .  phenytoin (DILANTIN) injection 100 mg, 100 mg, Intravenous, Q8H, Bhagat, Srishti L, MD, 100 mg at 05/18/20 0529 .  piperacillin-tazobactam (ZOSYN) IVPB 3.375 g, 3.375 g, Intravenous, Q8H, Thea Gist, RPH, Last Rate: 12.5 mL/hr at 05/18/20 0600, Rate Verify at 05/18/20 0600 .  polyethylene glycol (MIRALAX / GLYCOLAX) packet 17 g, 17 g, Per Tube, Daily, Brand Males, MD, 17 g at 05/17/20 1035 .  propofol (DIPRIVAN) 1000 MG/100ML infusion, 5-90 mcg/kg/min, Intravenous, Titrated, Amie Portland, MD, Last Rate: 27.9 mL/hr at 05/18/20 0736, 80 mcg/kg/min at 05/18/20 0736 .  sodium chloride flush (NS) 0.9 % injection 10-40 mL, 10-40 mL, Intracatheter, Q12H, Brand Males, MD, 10 mL at 05/17/20 2127 .  sodium chloride flush (NS) 0.9 % injection 10-40 mL, 10-40 mL, Intracatheter, PRN, Brand Males, MD .  thiamine tablet 100 mg, 100 mg, Per Tube, Daily, 100 mg at 05/17/20 1035 **OR** thiamine (B-1) injection 100 mg, 100 mg, Intravenous, Daily, Brand Males, MD Labs CBC    Component Value Date/Time   WBC 7.8 05/18/2020 0540   RBC 2.45 (L) 05/18/2020 0540   HGB 8.6 (L) 05/18/2020 0540   HCT 26.4 (L) 05/18/2020  0540   PLT 155 05/18/2020 0540   MCV 107.8 (H) 05/18/2020 0540   MCH 35.1 (H) 05/18/2020 0540   MCHC 32.6 05/18/2020 0540   RDW 11.9 05/18/2020 0540   LYMPHSABS 1.5 11/24/2019 1151   MONOABS 0.8 11/24/2019 1151   EOSABS 0.1 11/24/2019 1151   BASOSABS 0.0 11/24/2019 1151    CMP     Component Value Date/Time   NA 139 05/18/2020 0540   K 4.0 05/18/2020 0540   CL 113 (H) 05/18/2020 0540   CO2 17 (L) 05/18/2020 0540   GLUCOSE 97 05/18/2020 0540   BUN 10 05/18/2020 0540   CREATININE 0.68 05/18/2020 0540   CALCIUM 7.3 (L) 05/18/2020 0540   PROT 4.8 (L) 05/18/2020 0540   ALBUMIN 1.8 (L) 05/18/2020 0540   AST 21 05/18/2020 0540   ALT 21 05/18/2020 0540   ALKPHOS 20 (L) 05/18/2020 0540   BILITOT 0.6 05/18/2020 0540   GFRNONAA >60 05/18/2020 0540   GFRAA >60 05/18/2020 0540   ANA + (unclear significance)  Imaging I have reviewed images in epic and the results pertinent to this consultation are: MRI  of the brain consistent with findings of 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  EEG consistent with a PLEDs plus pattern suggesting high epileptogenicity. Currently on levetiracetam, phenytoin, Onfi-with phenobarbital added 2d ago. Also on sedation with Versed and propofol. Versed being uptitrated-up to 90 mg/h this morning. Propofol at 80 mcg/kg/min  Awaiting overnight LTM EEG read-approaching burst suppression but not adequately burst suppress this time.  Goal would be to bur suppress for 24 to 48 hours and then wean off sedation while controlling the blood pressures as you are and reevaluate.  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 Would like to burst suppress for 24 to 48 hours and then wean sedation for better neurological assessment again at that time. Work-up for pancreatitis per primary team as you are. Further testing for SLE  Will consider repeat MRI to assess for radiological change - might guide treatment Discussed plan with Dr. RChase Calleron the unit.  -- AAmie Portland MD [facility] Pager: [phone] 7pm to 7am, please call on call as listed on AMION.     CRITICAL CARE ATTESTATION Performed by: AAmie Portland MD Total critical care time: 30 minutes Critical care time was exclusive of separately billable procedures and treating other patients and/or supervising APPs/Residents/Students Critical care was necessary to treat or prevent imminent or life-threatening deterioration due to posterior reversible encephalopathy syndrome, status epilepticus This patient is critically ill and at significant risk for neurological worsening and/or death and care requires constant monitoring. Critical care was time spent personally by me on the following activities: development of treatment plan with patient and/or surrogate as well as nursing, discussions with consultants, evaluation of patient's response to treatment, examination of patient, obtaining history from patient or surrogate, ordering and performing treatments and interventions, ordering and review of laboratory studies, ordering and review of radiographic studies, pulse oximetry, re-evaluation of patient's condition, participation in multidisciplinary rounds and medical decision making of high complexity in the care of this patient.

## 2020-05-18 NOTE — Progress Notes (Signed)
CRITICAL VALUE ALERT  Critical Value: Triglycerides 530  Date & Time Notied:  05/18/20- 2013  Provider Notified: Pola Corn  Orders Received/Actions taken: awaiting orders

## 2020-05-18 NOTE — Progress Notes (Signed)
Nutrition Follow-up   DOCUMENTATION CODES:   Not applicable  INTERVENTION:   Continue tube feeding via OG tube: Vital High Protein decrease rate to 25 ml/h (600 ml per day) Add Prosource TF 45 ml QID  Provides 760 kcal (1589 kcal total with propofol), 97 gm protein, 502 ml free water daily to better meet re-estimated nutrition needs.   NUTRITION DIAGNOSIS:   Inadequate oral intake related to inability to eat as evidenced by NPO status.  Ongoing   GOAL:   Patient will meet greater than or equal to 90% of their needs   Met with TF  MONITOR:   Vent status, TF tolerance, Labs  REASON FOR ASSESSMENT:   Ventilator, Consult Enteral/tube feeding initiation and management  ASSESSMENT:    29 yo female admitted with acute on chronic pancreatitis and alcohol withdrawal. PMH includes alcohol abuse, HTN.   Remains intubated for burst suppression.  Propofol being increased to 90 mcg/kg/min today. ANA was positive, further work-up for SLE needed.    Currently receiving Vital High Protein via OGT at 45 ml/h (1080 ml/day) to provide 1080 kcals, 95 gm protein, 921m free water daily. With new rate of propofol, total kcal intake will be 1909 kcal.  Patient is currently intubated on ventilator support MV: 7.4 L/min Temp (24hrs), Avg:98.2 F (36.8 C), Min:97.5 F (36.4 C), Max:98.8 F (37.1 C)  Propofol: 27.9 ml/hr providing 737 kcal from lipid; rate increase to 31.4 ml/h would provide 829 kcal.  Labs reviewed. Mag 1.5 CBG: 96-87-95-91-75-74  Medications reviewed and include colace, folic acid, MVI with minerals, miralax, thiamine, Keppra, Versed, neosynephrine, propofol.   Diet Order:   Diet Order            Diet NPO time specified  Diet effective now                 EDUCATION NEEDS:   Not appropriate for education at this time  Skin:  Skin Assessment: Reviewed RN Assessment  Last BM:  9/24 type 7  Height:   Ht Readings from Last 1 Encounters:  05/10/20 5'  2" (1.575 m)    Weight:   Wt Readings from Last 1 Encounters:  05/18/20 60.3 kg    Ideal Body Weight:  50 kg  BMI:  Body mass index is 24.31 kg/m.  Estimated Nutritional Needs:   Kcal:  1435  Protein:  85-100 gm  Fluid:  >/= 1.8 L    KLucas Mallow RD, LDN, CNSC Please refer to Amion for contact information.

## 2020-05-18 NOTE — Progress Notes (Signed)
S/O: Called by Pharmacy regarding increasing triglycerides in the setting of propofol ggt. Trigycerides were 120 on 9/21 and have increased to 530 as of blood draw this evening.   BP 103/71   Pulse 91   Temp 97.6 F (36.4 C) (Oral)   Resp (!) 36   Ht 5\' 2"  (1.575 m)   Wt 60.3 kg   SpO2 100%   BMI 24.31 kg/m   HEENT: [place]/AT. No scleral injection. Lungs: Intubated Ext: Warm and well perfused  Mental Status: Intubated and sedated on propofol and Versed. Eyes closed with no responses to any external stimuli. Cranial Nerves: II:  Pupils 5 mm and minimally reactive. No blink to threat. III,IV, VI: No doll's eye reflex elicitable. Eyes are conjugate.  V,VII: Face flaccidly symmetric. Trace corneal reflexes bilaterally.  Motor/Sensory: Flaccid tone x 4 with no withdrawal or other movement to stimulation.  Plantars: Mute bilaterally  EEG reviewed. There are about 2 bursts per 8 second period, with flat low voltage tracings between. The bursts are longer than optimal and also have epileptiform morphologies at times.   A/R: 29 year old female with a history of HTN, migraine, seizures and significant EtOH abuse, admitted on 9/17 with N/V and abdominal pain. She was diagnosed with acute pancreatitis in the ED. Her admission was complicated with withdrawal symptoms and seizure on the morning of 9/19. Her Keppra dose was increased to 1 g BID. She was noted to have significantly high BPs that were refractory to antihypertensives  Subsequently became more confused and an MRI was obtained, revealing widespread cortical and subcortical signal abnormalities involving both of her cerebral hemispheres, as well as cerebellar hyperintensities in a roughly symmetric distribution that was most consistent with fulminant imaging manifestations of PRES. EEG demonstrated PLEDs, which could not be suppressed despite addition of Onfi and phenytoin to her regimen. She was then electively intubated and started on propofol  for burst suppression protocol.   1. She is now on propofol at a rate of 90 mcg/kg/min as well as Versed at a rate of 90 mg/hr. There has been significant improvement in the EEG burst suppression pattern, but it is still not optimal.  2. 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 This is also of concern given her diagnosis of pancreatitis. Will need to switch propofol to ketamine. The [facility] status protocol will be used for loading dose and maintenance infusion rate of ketamine. Loading dose ketamine is 1.5 mg/kg IV (90 mg) for up to 3 total doses 5 minutes apart. Maintenance infusion is 1.2 - 7.5 mg/kg/hr.  3. Given the refractory nature of her PLEDs, goal of a flat line EEG may be necessary rather than burst suppression for the next 24 hours. I have discussed with Dr. 10/19 and we are in agreement.  4. Repeat MRI brain is pending.   25 minutes spent in the above documented emergent neurological evaluation and management of this critically ill patient. Time spent included coordination of care.   Note: An additional 25 minutes were spent by Dr. Melynda Ripple in coordination of care for his 2 follow up notes documented today after his initial progress note. Discussed in sign out with Dr. Wilford Corner.   Total ICU time today: 80 minutes.   Electronically signed: Dr. Wilford Corner

## 2020-05-18 NOTE — TOC Initial Note (Addendum)
Transition of Care [facility]) - Initial/Assessment Note    Patient Details  Name: Donna Short MRN: [id number] Date of Birth: 07/26/1991  [facility] CM/SW Contact:    Lockie Pares, RN Phone Number: 05/18/2020, 2:18 PM  Clinical Narrative:                 Patient admitted with seizures, continue to have burst seizures despite heavy benzo use , versed up to 50mg  a hour drip.  Repeat MRI recommended. Patient intubated. Will need to look at [facility], MRI shows that  This is a reversible encephalopathy..  Still stratifying differential diagnosis.  Will continue to follow for needs.   Expected Discharge Plan: Skilled Nursing Facility Barriers to Discharge: Continued Medical Work up   Patient Goals and CMS Choice        Expected Discharge Plan and Services Expected Discharge Plan: Skilled Nursing Facility In-house Referral: Clinical Social Work Discharge Planning Services: CM Consult   Living arrangements for the past 2 months: Apartment                                      Prior Living Arrangements/Services Living arrangements for the past 2 months: Apartment Lives with:: Self Patient language and need for interpreter reviewed:: Yes        Need for Family Participation in Patient Care: Yes (Comment) Care giver support system in place?: Yes (comment)   Criminal Activity/Legal Involvement Pertinent to Current Situation/Hospitalization: No - Comment as needed  Activities of Daily Living Home Assistive Devices/Equipment: None ADL Screening (condition at time of admission) Patient's cognitive ability adequate to safely complete daily activities?: Yes Is the patient deaf or have difficulty hearing?: No Does the patient have difficulty seeing, even when wearing glasses/contacts?: No Does the patient have difficulty concentrating, remembering, or making decisions?: No Patient able to express need for assistance with ADLs?: Yes Does the patient have  difficulty dressing or bathing?: No Independently performs ADLs?: Yes (appropriate for developmental age) Does the patient have difficulty walking or climbing stairs?: No Weakness of Legs: None Weakness of Arms/Hands: None  Permission Sought/Granted                  Emotional Assessment   Attitude/Demeanor/Rapport: Intubated (Following Commands or Not Following Commands) Affect (typically observed): Unable to Assess   Alcohol / Substance Use: Not Applicable Psych Involvement: No (comment)  Admission diagnosis:  Hyperkalemia [E87.5] Pancreatitis, acute [K85.90] SIRS (systemic inflammatory response syndrome) (HCC) [R65.10] Alcohol-induced acute pancreatitis, unspecified complication status [K85.20] Patient Active Problem List   Diagnosis Date Noted  . 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 Pancreatitis, acute 05/11/2020  . AKI (acute kidney injury) (HCC) 05/10/2020  . Depression, recurrent (HCC) 12/26/2019  . Acute pancreatitis 11/17/2019  . Acute on chronic pancreatitis (HCC) 02/24/2019  . Uncomplicated asthma   . Benign essential HTN   . Tachycardia   . ETOH abuse   . Seizures (HCC) 12/19/2015   PCP:  12/21/2015, MD Pharmacy:   [facility] [address] - [address] [place] Phone: [phone] Fax: [phone]     Social Determinants of Health (SDOH) Interventions    Readmission Risk Interventions No flowsheet data found.

## 2020-05-18 NOTE — Progress Notes (Addendum)
Overnight EEG continues to show epileptogenicity arising from left posterior temporal region as well as cortical dysfunction in the left hemisphere additionally there is diffuse encephalopathy which is severe-likely due to sedation. Overall the EEG appears to be similar compared to the previous day and still has persistent epileptiform discharges which are favored to be ictal in nature.  After reviewing this EEG with the epileptologist, and the possible weakly positive ANA titer, I would recommend starting her on IV steroids for 5 days.  We will discuss with this with the PCCM attending. Also will consider repeating MRI brain to look for any progression of abnormalities that were seen on the initial MRI scan.  With the control of blood pressure, my hope would be to see some improvement that but clinically and electrographically, she does not appear to have made much improvement.  Another thought is doing an LP and sample CSF but given acute PRES like findings on MRI, CSF findings might be non specific and not high yield. Will keep this in mind going forward though.   -- Milon Dikes, MD [facility] Pager: [phone] If 7pm to 7am, please call on call as listed on AMION.

## 2020-05-18 NOTE — Progress Notes (Signed)
D/w Dr Marchelle Gearing CCM ANA not concerning from a PCCM standpoint. At this time, before considering steroid initiation, I would get a MRI brain w+w/o. Further recs based on clinical course and imaging.  -- Milon Dikes, MD [facility] Pager: [phone] If 7pm to 7am, please call on call as listed on AMION.

## 2020-05-18 NOTE — Progress Notes (Signed)
NAME:  Donna Short, MRN:  [id number], DOB:  [birth year], LOS: 7 ADMISSION DATE:  05/10/2020, CONSULTATION DATE:  05/14/20 REFERRING MD:  Loney Loh, CHIEF COMPLAINT:  Encephalopathy   Brief History   Donna Short is a 29 y.o. female with a PMHx of EtOH abuse and HTN admitted to [facility] 9/17 for acute on chronic pancreatitis and EtOH withdrawal. During her hospital stay she developed uncontrolled HTN and breakthrough seizure activity with MRI findings consistent with PRES.   Past Medical History  has a past medical history of Asthma, Depression, History of chicken pox, Hypertension, Migraines, Pancreatitis (01/2018), and Seizures (HCC).  Significant Hospital Events   9/16 - admit 9/18, 9/19 breakthrough seizures 9/20- MRI showing PRES 9/21 -Patient had a hypoglycemic episode with BG 61 and shaking overnight consistent with previous episodes past 2 days. Symptoms responded well to 8 oz of juice and BG improved to 72 within 30 minutes. Likely due to poor oral intake intubation for burst suppression  9/22  - 9/22 - intubated yesterday for burst suppressio needs -> post intubation hypothermic/then febrile 102F with hypotension/pressor need. Off cleviprex -> this morning off pressors.  Continued hypoglycemia -> needing more D5. Not yet on TF. Making urine. Neuro Dr Wilford Corner concerned about recurrent PRES (milder in Jan 2021). Wants to consider LP if fever persists and culture negative. Currently on diprivan gtt and MAP at goal - 80-90  Consults:  Neurology- consulted 9/20, recommended maintaining strict BP control with MAP goals 80-90, Keppra 1g 2x daily for seizure control  Procedures:  ETT 9/21 PICC 9/22   Significant Diagnostic Tests:  9!6 = Abdominal CT- findings consistent with acute on chronic pancreatitis predominantly involving the proximal pancreas. No peripancreatic fluid collections are identified. EEG- 9/20, evidence of epileptogenicity arising fromleft posterior temporal region as  well as cortical dysfunction in left hemisphere due to underlying structural abnormality, PRES MRI- 9/20, patchy/extensive brain edema in a pattern consistent with PRES 9/22 - echo and duplex - normal  Micro Data:  9!6 = Abdominal CT- findings consistent with acute on chronic pancreatitis predominantly involving the proximal pancreas. No peripancreatic fluid collections are identified. EEG- 9/20, evidence of epileptogenicity arising fromleft posterior temporal region as well as cortical dysfunction in left hemisphere due to underlying structural abnormality, PRES MRI- 9/20, patchy/extensive brain edema in a pattern consistent with PRES 9/22 - echo and duplex - normal  Antimicrobials:  Aztreoniam 9/22 (empiric sepsis)>> 9/22, Zosyn 9/22 >>   Interim history/subjective:  9/23  EEG with continued PLED per neuro. Versed increaed from 30mg /h to 40mg /h. MAx diprivan. On keppra On neop. On d5 normal saline. On TF. 40% fio2 on vent. ECHO normal. No DVT on duplex. Afebrile. MAP at 90 at goal  No labs in results today. AUtoimmune sent yestserday. UDS Sent yesterday  Off cleviprex  Objective   Blood pressure 102/71, pulse 99, temperature 98.5 F (36.9 C), temperature source Oral, resp. rate 18, height 5\' 2"  (1.575 m), weight 60.3 kg, SpO2 100 %.    Vent Mode: PRVC FiO2 (%):  [40 %] 40 % Set Rate:  [18 bmp] 18 bmp Vt Set:  [420 mL-435 mL] 420 mL PEEP:  [5 cmH20] 5 cmH20 Plateau Pressure:  [17 cmH20-19 cmH20] 18 cmH20   Intake/Output Summary (Last 24 hours) at 05/18/2020 1129 Last data filed at 05/18/2020 1100 Gross per 24 hour  Intake 5493.91 ml  Output 2460 ml  Net 3033.91 ml   Filed Weights   05/10/20 1131 05/18/20 0545  Weight: 58.1  kg 60.3 kg    Examination: General: Intubated/Sedate patient,  HENT: EEG w/ scalp electrodes in place, normocephalic, atraumatic, pinpoint pupils reactive bilaterally Lungs: clear to ausculation bilaterally, ventilated breathing Cardiovascular:  normal S1/S2, RRR Abdomen: soft, hypoactive bowel sounds Extremities: extremities intact, warm and well perfused Neuro: sedated patient,    Resolved Hospital Problem list   AKI-- initially presented with Cr 1.4, baseline .7-.9, normalized followed administration of IV fluids  Assessment & Plan:  Encephalopathy/PRES- 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 Jan 2021, seizures on 9/19 with continue epileptiform activity on EEG, 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, possible autoimmune etiology given refractory nature to anti-epileptics and weakly positive ANA Per Neurology -- cEEG -- MAP goals 80-90 -- anti-convulsant therapy (Versed, Propofol, Keppra, Phenytoin)  - phenytoin levels low 9/24 with albumin correction, holding phenytoin today and will recheck tomorrow -- consider MRI -- consider 5 day course of High dose IV steroids -- CTM autoimmune and vasculitis profile -- consider LP and sample CSF   Acute Respiratory Failure- S/p Intubation for burst Suppression 9/20 -- full vent support -- VAP protocol  Pancreatitis (acute on chronic)- initial CC on admission with CT findins and lipase 101 -- tolerated TF well  Hypothermia/Sepsis- patient with Tmin of 97.6, Hypothermic and then febrile 9/21-9/22 with lactic acidosis 3, considering infection, MRSA PCR negative Lactic acidosis + -- normothermic under warming blanket -- continue empiric Zosyn -- continue phenylephrine (rate 25 mcg/min) -- urine cultures negative x24 hours -- sputum cultures with normal flora  Hypoglycemia- patient with recurrent episode of hypoglycemia with sugars down to 60s on 9/21 and 9/22, resolved with juice, patient now of Tube Feeds and hasn't been a problem -- D5NS -- CTM  Thrombocytopenia- baseline platelets 150-200, similar thrombocytopenia in March 2021, got worse with nadir 05/14/20 to 60 and then better to 93. On Lovenox. DUplex negative -- continue Lovenox -- HIT panel negative (9/22)   Best practice:  Diet:  TF Pain/Anxiety/Delirium protocol (if indicated): diprivan gtt + versed gtt VAP protocol (if indicated): in place DVT prophylaxis: Lovenox GI prophylaxis: N/a Glucose control: ssi Mobility: bedrest Code Status: full code Family Communication: FMLA form done 9/21. Mom Meryl Dareammie Murray [phone] updated 9/21 at bedside and over phone 05/16/20 and again 9/23 over phone Disposition: continued ICU care  Labs   CBC: Recent Labs  Lab 05/14/20 0134 05/14/20 0134 05/15/20 0123 05/15/20 2205 05/16/20 0021 05/17/20 1004 05/18/20 0540  WBC 7.8  --  8.9  --  8.1 9.2 7.8  HGB 12.1   < > 12.5 11.2* 13.1 9.3* 8.6*  HCT 35.7*   < > 35.6* 33.0* 37.7 28.0* 26.4*  MCV 99.4  --  97.8  --  99.0 105.7* 107.8*  PLT 60*  --  80*  --  93* 130* 155   < > = values in this interval not displayed.    Basic Metabolic Panel: Recent Labs  Lab 05/14/20 0134 05/14/20 0134 05/14/20 1444 05/14/20 1444 05/15/20 0123 05/15/20 2205 05/16/20 0021 05/17/20 1004 05/18/20 0540  NA 134*   < > 133*   < > 134* 138 133* 137 139  K 2.9*   < > 4.5   < > 3.6 3.4* 3.2* 4.3 4.0  CL 96*   < > 95*  --  97*  --  101 112* 113*  CO2 22   < > 22  --  22  --  20* 18* 17*  GLUCOSE 99   < > 84  --  107*  --  182* 167*  97  BUN 13   < > 13  --  11  --  9 9 10   CREATININE 0.76   < > 0.72  --  0.66  --  0.80 0.66 0.68  CALCIUM 9.1   < > 9.7  --  9.6  --  9.2 7.5* 7.3*  MG 1.5*  --   --   --   --   --  1.5* 1.8 1.5*  PHOS 3.2  --   --   --   --   --  4.1 3.5 3.8   < > = values in this interval not displayed.   GFR: Estimated Creatinine Clearance: 87.2 mL/min (by C-G formula based on SCr of 0.68 mg/dL). Recent Labs  Lab 05/15/20 0123 05/15/20 1108 05/16/20 0021 05/16/20 0946 05/17/20 1004 05/18/20 0540  PROCALCITON  --   --   --  <0.10  --   --   WBC 8.9  --  8.1  --  9.2 7.8  LATICACIDVEN  --  3.6*  --  3.6* 1.5 1.0    Liver Function Tests: Recent Labs  Lab 05/14/20 0134 05/15/20 0123 05/16/20 0021  05/17/20 1004 05/18/20 0540  AST 44* 49* 49* 26 21  ALT 28 31 32 24 21  ALKPHOS 31* 33* 30* 22* 20*  BILITOT 1.3* 0.5 0.9 0.5 0.6  PROT 6.7 7.0 6.5 5.1* 4.8*  ALBUMIN 3.1* 3.5 3.2* 2.1* 1.8*   Recent Labs  Lab 05/17/20 1004  LIPASE 24   No results for input(s): AMMONIA in the last 168 hours.  ABG    Component Value Date/Time   PHART 7.533 (H) 05/15/2020 2205   PCO2ART 34.6 05/15/2020 2205   PO2ART 589 (H) 05/15/2020 2205   HCO3 29.7 (H) 05/15/2020 2205   TCO2 31 05/15/2020 2205   ACIDBASEDEF 16.0 (H) 11/17/2019 2006   O2SAT 100.0 05/15/2020 2205     Coagulation Profile: No results for input(s): INR, PROTIME in the last 168 hours.  Cardiac Enzymes: Recent Labs  Lab 05/18/20 0540  CKTOTAL 153  CKMB 1.2    HbA1C: Hgb A1c MFr Bld  Date/Time Value Ref Range Status  11/18/2019 06:33 AM 5.2 4.8 - 5.6 % Final    Comment:    (NOTE) Pre diabetes:          5.7%-6.4% Diabetes:              >6.4% Glycemic control for   <7.0% adults with diabetes   08/27/2019 07:10 PM 5.1 4.8 - 5.6 % Final    Comment:    (NOTE) Pre diabetes:          5.7%-6.4% Diabetes:              >6.4% Glycemic control for   <7.0% adults with diabetes     CBG: Recent Labs  Lab 05/18/20 0214 05/18/20 0347 05/18/20 0537 05/18/20 0742 05/18/20 0743  GLUCAP 87 95 91 75 74    Review of Systems:   ROS unable to be performed 2/2 intubation/sedation  Past Medical History  She,  has a past medical history of Asthma, Depression, History of chicken pox, Hypertension, Migraines, Pancreatitis (01/2018), and Seizures (HCC).   Surgical History    Past Surgical History:  Procedure Laterality Date  . WISDOM TOOTH EXTRACTION       Social History   reports that she quit smoking about 2 years ago. Her smoking use included cigarettes. She has never used smokeless tobacco. She reports current alcohol use of  about 9.0 standard drinks of alcohol per week. She reports that she does not use drugs.    Family History   Her family history includes Alcohol abuse in her father; Arthritis in her father; Asthma in her mother; Breast cancer in her mother; Diabetes in her father; Drug abuse in her mother; Hypertension in her mother.   Allergies Allergies  Allergen Reactions  . Claritin [Loratadine] Itching  . Peanut-Containing Drug Products Other (See Comments)  . Benadryl [Diphenhydramine Hcl (Sleep)] Rash  . Latex Rash  . Penicillins Itching and Rash    Has patient had a PCN reaction causing immediate rash, facial/tongue/throat swelling, SOB or lightheadedness with hypotension: Yes Has patient had a PCN reaction causing severe rash involving mucus membranes or skin necrosis: No Has patient had a PCN reaction that required hospitalization: Yes Has patient had a PCN reaction occurring within the last 10 years: No If all of the above answers are "NO", then may proceed with Cephalosporin use.;  . Shrimp [Shellfish Allergy] Hives and Rash     Home Medications  Prior to Admission medications   Medication Sig Start Date End Date Taking? Authorizing Provider  acetaminophen (TYLENOL) 500 MG tablet Take 1,000 mg by mouth every 6 (six) hours as needed for mild pain.   Yes [provider]  albuterol (VENTOLIN HFA) 108 (90 Base) MCG/ACT inhaler Inhale 1-2 puffs into the lungs every 6 (six) hours as needed for wheezing or shortness of breath. 02/08/19  Yes Wieters, Hallie C, PA-C  FLUoxetine (PROZAC) 20 MG capsule Take 1 capsule (20 mg total) by mouth daily. 12/26/19  Yes Orland Mustard, MD  folic acid (FOLVITE) 1 MG tablet Take 1 tablet (1 mg total) by mouth daily. 12/21/19  Yes Orland Mustard, MD  levETIRAcetam (KEPPRA) 500 MG tablet Take 1 tablet (500 mg total) by mouth 2 (two) times daily. 03/14/20  Yes Orland Mustard, MD  lisinopril (ZESTRIL) 20 MG tablet Take 1 tablet (20 mg total) by mouth daily. 12/26/19  Yes Orland Mustard, MD  metoprolol tartrate (LOPRESSOR) 25 MG tablet Take 1 tablet (25  mg total) by mouth 2 (two) times daily. 12/26/19  Yes Orland Mustard, MD  ibuprofen (ADVIL) 600 MG tablet Take 1 tablet (600 mg total) by mouth every 8 (eight) hours as needed. Patient not taking: Reported on 05/11/2020 08/25/19   Orland Mustard, MD    Hilario Quarry, MS4

## 2020-05-18 NOTE — Progress Notes (Signed)
LTM maint complete - no skin breakdown under: A2 F8 F4 Fp2 

## 2020-05-18 NOTE — Procedures (Signed)
Patient Name:Donna Short PNP:[id number] Epilepsy Attending:Hadlei Stitt [name] Referring Physician/Provider:Dr Brooke Dare Duration:05/17/2020 [phone] to 9/24/[id number]  Patient history:29 year old female with history of alcohol abuse admitted with acute pancreatitis with hospital admission complicated with significantly elevated blood pressure, withdrawal, seizure and worsening mental status. EEG to evaluate for seizure.  Level of alertness:Awake/lethargic  AEDs during EEG study:LEV  Technical aspects: This EEG study was done with scalp electrodes positioned according to the 10-20 International system of electrode placement. Electrical activity was acquired at a sampling rate of 500Hz  and reviewed with a high frequency filter of 70Hz  and a low frequency filter of 1Hz . EEG data were recorded continuously and digitally stored.   Description:No posterior dominant rhythm was seen. EEG showed bursts attenuation with generalized 3-4seconds of eeg suppression and bursts of 2-3 seconds of PLEDs plus in left posterior temporal region. Gradually, periods of suppression became smaller and lasted 2-3 seconds.    ABNORMALITY - Bursts attenuation with highly epileptiform discharges  IMPRESSION: This study showed evidence of epileptogenicity arising fromleft posterior temporal region as well as cortical dysfunction in left hemisphere due to underlying structural abnormality. Additionally, there is severe diffuse encephalopathy likely due to sedation. Overall eeg appears to be similar compared to previous day and still has persistent epileptiform discharges which are favored to be ictal nature.   Hartley Urton 

## 2020-05-19 LAB — CBC
HCT: 28.8 % — ABNORMAL LOW (ref 36.0–46.0)
Hemoglobin: 9.2 g/dL — ABNORMAL LOW (ref 12.0–15.0)
MCH: 34.2 pg — ABNORMAL HIGH (ref 26.0–34.0)
MCHC: 31.9 g/dL (ref 30.0–36.0)
MCV: 107.1 fL — ABNORMAL HIGH (ref 80.0–100.0)
Platelets: 201 10*3/uL (ref 150–400)
RBC: 2.69 MIL/uL — ABNORMAL LOW (ref 3.87–5.11)
RDW: 11.7 % (ref 11.5–15.5)
WBC: 5.8 10*3/uL (ref 4.0–10.5)
nRBC: 0 % (ref 0.0–0.2)

## 2020-05-19 LAB — GLUCOSE, CAPILLARY
Glucose-Capillary: 71 mg/dL (ref 70–99)
Glucose-Capillary: 78 mg/dL (ref 70–99)
Glucose-Capillary: 125 mg/dL — ABNORMAL HIGH (ref 70–99)
Glucose-Capillary: 127 mg/dL — ABNORMAL HIGH (ref 70–99)
Glucose-Capillary: 76 mg/dL (ref 70–99)
Glucose-Capillary: 108 mg/dL — ABNORMAL HIGH (ref 70–99)
Glucose-Capillary: 80 mg/dL (ref 70–99)
Glucose-Capillary: 141 mg/dL — ABNORMAL HIGH (ref 70–99)
Glucose-Capillary: 149 mg/dL — ABNORMAL HIGH (ref 70–99)
Glucose-Capillary: 142 mg/dL — ABNORMAL HIGH (ref 70–99)
Glucose-Capillary: 52 mg/dL — ABNORMAL LOW (ref 70–99)
Glucose-Capillary: 80 mg/dL (ref 70–99)
Glucose-Capillary: 136 mg/dL — ABNORMAL HIGH (ref 70–99)
Glucose-Capillary: 128 mg/dL — ABNORMAL HIGH (ref 70–99)

## 2020-05-19 LAB — PHOSPHORUS: Phosphorus: 5.2 mg/dL — ABNORMAL HIGH (ref 2.5–4.6)

## 2020-05-19 LAB — PHENYTOIN LEVEL, TOTAL: Phenytoin Lvl: 15.2 ug/mL (ref 10.0–20.0)

## 2020-05-19 MED ORDER — GADOBUTROL 1 MMOL/ML IV SOLN
6.0000 mL | Freq: Once | INTRAVENOUS | Status: AC | PRN
  Administered 2020-05-19: 6 mL via INTRAVENOUS

## 2020-05-19 MED ORDER — LEVETIRACETAM IN NACL 1500 MG/100ML IV SOLN
1500.0000 mg | Freq: Two times a day (BID) | INTRAVENOUS | Status: DC
  Administered 2020-05-19 – 2020-05-29 (×20): 1500 mg via INTRAVENOUS
  Filled 2020-05-19 (×20): qty 100

## 2020-05-19 MED ORDER — PHENOBARBITAL SODIUM 65 MG/ML IJ SOLN
65.0000 mg | Freq: Two times a day (BID) | INTRAMUSCULAR | Status: DC
  Administered 2020-05-19 – 2020-05-29 (×20): 65 mg via INTRAVENOUS
  Filled 2020-05-19 (×20): qty 1

## 2020-05-19 MED ORDER — PHENOBARBITAL SODIUM 130 MG/ML IJ SOLN
20.0000 mg/kg | Freq: Once | INTRAMUSCULAR | Status: AC
  Administered 2020-05-19: 1288.3 mg via INTRAVENOUS
  Filled 2020-05-19: qty 9.91

## 2020-05-19 MED ORDER — SODIUM CHLORIDE 0.9 % IV SOLN
1.8000 mg/kg/h | INTRAVENOUS | Status: DC
  Administered 2020-05-19 – 2020-05-21 (×6): 2.4 mg/kg/h via INTRAVENOUS
  Filled 2020-05-19 (×10): qty 12.5

## 2020-05-19 NOTE — Progress Notes (Addendum)
Pharmacy Antibiotic Note  Donna Short is a 29 y.o. female admitted on 05/10/2020 with seizures and AMS, no concerned for sepsis.  Pharmacy has been consulted for aztreonam dosing.   Pt is now on D4 of Zosyn for empiric sepsis/fever. She has remained AF and wbc wnl. All cultures have remained neg so far. Ok to stop zosyn per Dr. Marchelle Gearing.   Plan: Dc zosyn    Height: 5\' 2"  (157.5 cm) Weight: 64.4 kg (141 lb 15.6 oz) IBW/kg (Calculated) : 50.1  Temp (24hrs), Avg:98.3 F (36.8 C), Min:97.6 F (36.4 C), Max:98.9 F (37.2 C)  Recent Labs  Lab 05/14/20 0134 05/14/20 0134 05/14/20 1444 05/15/20 0123 05/15/20 1108 05/16/20 0021 05/16/20 0946 05/17/20 1004 05/18/20 0540  WBC 7.8  --   --  8.9  --  8.1  --  9.2 7.8  CREATININE 0.76   < > 0.72 0.66  --  0.80  --  0.66 0.68  LATICACIDVEN  --   --   --   --  3.6*  --  3.6* 1.5 1.0   < > = values in this interval not displayed.    Estimated Creatinine Clearance: 89.8 mL/min (by C-G formula based on SCr of 0.68 mg/dL).    Allergies  Allergen Reactions  . Claritin [Loratadine] Itching  . Peanut-Containing Drug Products Other (See Comments)  . Benadryl [Diphenhydramine Hcl (Sleep)] Rash  . Latex Rash  . Shrimp [Shellfish Allergy] Hives and Rash    Antimicrobials this admission: Aztreonam 9/22 x1 Zosyn 9/22>>9/25   Microbiology results: 9/20 MRSA PCR - negative  9/22 Urine cx - negative  9/22 TA - few GNR, moderate GPC - consistent with respiratory flora  9/22 Blood cx - ngtd  9/22 urinary strep/legionella neg  10/22, PharmD, BCIDP, AAHIVP, CPP Infectious Disease Pharmacist 05/19/2020 7:59 AM

## 2020-05-19 NOTE — Progress Notes (Signed)
eLink Physician-Brief Progress Note Patient Name: Donna Short DOB: [birth year] MRN: [id number]   Date of Service  05/19/2020  HPI/Events of Note  Blood sugar 78 mg / dl  eICU Interventions  D 10 % infusion increased to 75 ml / hour.        Thomasene Lot Lakin Romer 05/19/2020, 2:56 AM

## 2020-05-19 NOTE — Progress Notes (Signed)
Pt transported to MRI and back with complication.

## 2020-05-19 NOTE — Progress Notes (Signed)
NAME:  Donna Short, MRN:  [id number], DOB:  Feb 28, 1991, LOS: 8 ADMISSION DATE:  05/10/2020, CONSULTATION DATE:  05/14/20 REFERRING MD:  Loney Loh, CHIEF COMPLAINT:  Encephalopathy   Brief History   Donna SCALESE is a 29 y.o. female with a PMHx of EtOH abuse and HTN admitted to [facility] 9/17 for acute on chronic pancreatitis and EtOH withdrawal. During her hospital stay she developed uncontrolled HTN and breakthrough seizure activity with MRI findings consistent with PRES.   Past Medical History   has a past medical history of Asthma, Depression, History of chicken pox, Hypertension, Migraines, Pancreatitis (01/2018), and Seizures (HCC). Allergies  Allergen Reactions  . Claritin [Loratadine] Itching  . Peanut-Containing Drug Products Other (See Comments)  . Benadryl [Diphenhydramine Hcl (Sleep)] Rash  . Latex Rash  . Shrimp [Shellfish Allergy] Hives and Rash     Significant Hospital Events   9/16 - admit 9/18, 9/19 breakthrough seizures 9/20- MRI showing PRES 9/21 -Patient had a hypoglycemic episode with BG 61 and shaking overnight consistent with previous episodes past 2 days. Symptoms responded well to 8 oz of juice and BG improved to 72 within 30 minutes. Likely due to poor oral intake intubation for burst suppression  9/22  - 9/22 - intubated yesterday for burst suppressio needs -> post intubation hypothermic/then febrile 102F with hypotension/pressor need. Off cleviprex -> this morning off pressors.  Continued hypoglycemia -> needing more D5. Not yet on TF. Making urine. Neuro Dr Wilford Corner concerned about recurrent PRES (milder in Jan 2021). Wants to consider LP if fever persists and culture negative. Currently on diprivan gtt and MAP at goal - 80-90  9/23 -  9/23  EEG with continued PLED per neuro. Versed increaed from 30mg /h to 40mg /h. MAx diprivan. On keppra On neop. On d5 normal saline. On TF. 40% fio2 on vent. ECHO normal. No DVT on duplex. Afebrile. MAP at 90 at goal. No labs in  results today. AUtoimmune sent yestserday. UDS Sent yesterday. Off cleviprex  9/24 - on 40% FiO2.  On ventilator.  On low-dose Neo-Synephrine to keep the MAP between 80 and 90.  Not needing Cleviprex.  Has increased Versed needs.  Currently on Versed 90 mg/h and propofol 90 mcg.  Autoimmune profile negative except for weak positive ANA.  Has hypoglycemia despite tube feeds.  Started on D10 -infusion  Consults:  Neurology- consulted 9/20, recommended maintaining strict BP control with MAP goals 80-90, Keppra 1g 2x daily for seizure control  Procedures:  ETT 9/21 PICC 9/22   Significant Diagnostic Tests:  9!6 = Abdominal CT- findings consistent with acute on chronic pancreatitis predominantly involving the proximal pancreas. No peripancreatic fluid collections are identified. EEG- 9/20, evidence of epileptogenicity arising from left posterior temporal region as well as cortical dysfunction in left hemisphere due to underlying structural abnormality, PRES MRI- 9/20, patchy/extensive brain edema in a pattern consistent with PRES 9/22 - echo and duplex - normal  Micro Data:  SARS 9/16 - neg Trach 9/23 - nl flora Urine 9/22 - neg Blood 9/22 - neg so far MRSa PCR 9/20 -neg Autoimmune and vasculitis panel 9/22 >> ANA weak positive but  Otherwise neg U Tox 9/22 - positive benzo (Versed infusion)  Antimicrobials:  Aztreoniam 9/22 (empiric sepsis)>> 9/22, Zosyn 9/22 >> 05/19/2020  Interim history/subjective:    05/19/2020 -remains on 40% oxygen and PEEP of 5.  On the ventilator.  RASS sedation score -5.  Propofol has been stopped because of increased triglycerides.  Continues on Versed infusion  ketamine has been started.  Neuro electrophysiologist -EEG showed improved PLEDs and now replaced by intermittent spikes.  Ongoing encephalopathy present.  Overall EEG significantly improved.  Also on phenobarb and Keppra.  Dilantin still on hold  Currently off neo because mean arterial pressure is  automatically between 80 and 90  Objective   Blood pressure 101/75, pulse 85, temperature 98.3 F (36.8 C), temperature source Oral, resp. rate 16, height 5\' 2"  (1.575 m), weight 64.4 kg, SpO2 100 %.    Vent Mode: PRVC FiO2 (%):  [40 %] 40 % Set Rate:  [18 bmp] 18 bmp Vt Set:  [420 mL] 420 mL PEEP:  [5 cmH20] 5 cmH20 Plateau Pressure:  [17 cmH20-21 cmH20] 17 cmH20   Intake/Output Summary (Last 24 hours) at 05/19/2020 1505 Last data filed at 05/19/2020 1500 Gross per 24 hour  Intake 4898.72 ml  Output 3900 ml  Net 998.72 ml   Filed Weights   05/10/20 1131 05/18/20 0545 05/19/20 0424  Weight: 58.1 kg 60.3 kg 64.4 kg   Critically ill looking young lady.  Deeply sedated on the ventilator.  RASS sedation score -5.  Synchronous with the ventilator at 40%.  Has a 05/21/20 on clear to auscultation bilaterally normal heart sounds.  Abdomen soft.  Tolerating tube feeds.        Resolved Hospital Problem list   AKI- initially presented with Cr 1.4, baseline .7-.9, normalized following administration of IV fluids    Assessment & Plan:     ASSESSMENT / PLAN:  PULMONARY  A:  Acute Resp Failure - s/p intubation for burst suppression 05/14/20  05/19/2020 - > does not meet criteria for spontaneous breathing trial extubation in the setting of acute encephalopathy  P:   Full mechanical ventilator support VAP protocool   NEUROLOGIC A:   PRES recurrence with seizures 9/19., Confirmed on MRI 9/20 . Worse compared to Jan 2021.  Dipper Feb 2021 stopped on May 18, 2020 secondary to high triglycerides  05/19/2020 - RASS -5.  On high-dose Versed infusion and also ketamine infusion.  On Keppra and phenobarb EEG with improvement.  P:   Per neuro EEG  Versed gtt and ketamine gtt  for sedation and PLED conrol Keppra and phenobarb per neuro Neuro holding dilantin   VASCULAR A:   Hypertensive all admission needing cleviprex Hypotensive post intubatioin 9/21 and ?  Sepsis - needing  transient pressors  -Mean arterial pressure at goal between 80 and 90.  Not needing Neo-Synephrine.  Not needing Cleviprex  P:  MAP goal 80-90 (currently there with diprivan gtt and neo-) Remove neo from the [facility]   CARDIAC STRUCTURAL A: Normal heart on echo 2019.  Repeat echocardiogram May 16, 2020 is normal    P: Monitor  CARDIAC ELECTRICAL  A: In Sinus  QTC 472 msec on May 16, 2020  P: Monitor QTc prn -   INFECTIOUS A:   Hypothermic and then febrile 9/21-9/22 with lactic acidosis 3 MRSA PCR negative.  Urine strep negative Lactic acodiss + -resolved 05/17/2020  05/19/2020 - afebrle. PCT Normal.  Lactic acid normal.  So far culture negative  P:   Stop Zosyn MR or LP based on course (per neuro)  RENAL A:  AKI resolved 9/17  P:  monitor  ELECTROLYTES A:  Hypomagnesemia   P: Replete magnesium   GASTROINTESTINAL A:   Pancreatitis (acute on chronic ) - etoh 05/10/2020 - admit based on CT with lipase 101 at admit  05/19/2020 =- tolerating TF  P:  TF  HEMATOLOGIC   - HEME A:  AT risk anemia critical illness   P:  - PRBC for hgb </= 6.9gm%    - exceptions are   -  if ACS susepcted/confirmed then transfuse for hgb </= 8.0gm%,  or    -  active bleeding with hemodynamic instability, then transfuse regardless of hemoglobin value   At at all times try to transfuse 1 unit prbc as possible with exception of active hemorrhage   HEMATOLOGIC - Platelets A Thrombocytopenia [Baseline platelets 150-200; thrombocytopenia 80s in [facility] 2021)  -Transient May 14, 2020-May 18, 2020.  Duplex negative.  HIT antibody panel - 05/16/2020  P Continue lovenox    ENDOCRINE A:   Hypoglycemia - needing D5NS  -Ongoing despite tube feeds on 05/18/2020.  Improved 05/19/2020 after adding D10 P:   Continue TF D10 to continue SSI  MSK/DERM AT risk for Sacral deub-909/25/21 per nursing At risk for rhabdo due to diprivan -high triglycerides  and to prevent stopped 05/18/2020.   05/19/2020  -  Plan  - nursing care - monitor CK and lactate as neededm -recheck 05/20/2020  Best practice:  Diet: start TF Pain/Anxiety/Delirium protocol (if indicated): diprivan gtt + versed gtt VAP protocol (if indicated): N/a DVT prophylaxis: Lovenox GI prophylaxis: N/a Glucose control: ssi Mobility: bedrest Code Status: Full code Family Communication:  FMLA form done 9/21. Mom Meryl Dare [phone]) updated 9/21 at bedside and over phone 05/16/20 and again 9/23 over phone and 05/19/20  Disposition: Continued ICU care     ATTESTATION & SIGNATURE   The patient SAMIYYAH MOFFA is critically ill with multiple organ systems failure and requires high complexity decision making for assessment and support, frequent evaluation and titration of therapies, application of advanced monitoring technologies and extensive interpretation of multiple databases.   Critical Care Time devoted to patient care services described in this note is  45  Minutes. This time reflects time of care of this signee Dr Kalman Shan. This critical care time does not reflect procedure time, or teaching time or supervisory time of PA/NP/Med student/Med Resident etc but could involve care discussion time     Dr. Kalman Shan, M.D., [facility].C.P Pulmonary and Critical Care Medicine Staff Physician [facility] [place] [facility] Pager: [phone], If no answer or between  15:00h - 7:00h: call [phone]  05/19/2020 3:05 PM     LABS    PULMONARY Recent Labs  Lab 05/15/20 2205  PHART 7.533*  PCO2ART 34.6  PO2ART 589*  HCO3 29.7*  TCO2 31  O2SAT 100.0    CBC Recent Labs  Lab 05/16/20 0021 05/17/20 1004 05/18/20 0540  HGB 13.1 9.3* 8.6*  HCT 37.7 28.0* 26.4*  WBC 8.1 9.2 7.8  PLT 93* 130* 155    COAGULATION No results for input(s): INR in the last 168 hours.  CARDIAC  No results for input(s): TROPONINI in the last  168 hours. No results for input(s): PROBNP in the last 168 hours.   CHEMISTRY Recent Labs  Lab 05/14/20 0134 05/14/20 0134 05/14/20 1444 05/14/20 1444 05/15/20 0123 05/15/20 0123 05/15/20 2205 05/15/20 2205 05/16/20 0021 05/16/20 0021 05/17/20 1004 05/18/20 0540 05/19/20 0421  NA 134*   < > 133*   < > 134*  --  138  --  133*  --  137 139  --   K 2.9*   < > 4.5   < > 3.6   < > 3.4*   < >  3.2*   < > 4.3 4.0  --   CL 96*   < > 95*  --  97*  --   --   --  101  --  112* 113*  --   CO2 22   < > 22  --  22  --   --   --  20*  --  18* 17*  --   GLUCOSE 99   < > 84  --  107*  --   --   --  182*  --  167* 97  --   BUN 13   < > 13  --  11  --   --   --  9  --  9 10  --   CREATININE 0.76   < > 0.72  --  0.66  --   --   --  0.80  --  0.66 0.68  --   CALCIUM 9.1   < > 9.7  --  9.6  --   --   --  9.2  --  7.5* 7.3*  --   MG 1.5*  --   --   --   --   --   --   --  1.5*  --  1.8 1.5*  --   PHOS 3.2  --   --   --   --   --   --   --  4.1  --  3.5 3.8 5.2*   < > = values in this interval not displayed.   Estimated Creatinine Clearance: 89.8 mL/min (by C-G formula based on SCr of 0.68 mg/dL).   LIVER Recent Labs  Lab 05/14/20 0134 05/15/20 0123 05/16/20 0021 05/17/20 1004 05/18/20 0540  AST 44* 49* 49* 26 21  ALT 28 31 32 24 21  ALKPHOS 31* 33* 30* 22* 20*  BILITOT 1.3* 0.5 0.9 0.5 0.6  PROT 6.7 7.0 6.5 5.1* 4.8*  ALBUMIN 3.1* 3.5 3.2* 2.1* 1.8*     INFECTIOUS Recent Labs  Lab 05/16/20 0946 05/17/20 1004 05/18/20 0540  LATICACIDVEN 3.6* 1.5 1.0  PROCALCITON <0.10  --   --      ENDOCRINE CBG (last 3)  Recent Labs    05/19/20 1003 05/19/20 1127 05/19/20 1419  GLUCAP 108* 80 141*         IMAGING x48h  - image(s) personally visualized  -   highlighted in bold MR BRAIN W WO CONTRAST  Result Date: 05/19/2020 CLINICAL DATA:  Follow-up examination for seizure. History of alcohol abuse. Known PRES. EXAM: MRI HEAD WITHOUT AND WITH CONTRAST TECHNIQUE: Multiplanar,  multiecho pulse sequences of the brain and surrounding structures were obtained without and with intravenous contrast. CONTRAST:  6mL GADAVIST GADOBUTROL 1 MMOL/ML IV SOLN COMPARISON:  Recent MRI from 05/14/2020. FINDINGS: Brain: Again seen are patchy cortical subcortical T2/FLAIR hyperintensity involving the bilateral frontal, parietal, temporal, and occipital regions bilaterally. Changes most pronounced at the right greater than left frontal regions and left temporal occipital region. Mild patchy involvement of the cerebellum again noted. Minimal patchy involvement of the left thalamus. Overall, appearance is somewhat improved as compared to recent brain MRI from 05/14/2020. Previously seen associated diffusion abnormality has nearly resolved. No real significant associated enhancement to speak of. No evidence for associated hemorrhage on SWI sequence. No significant regional mass effect or midline shift. No evidence for acute or interval vascular infarct. No mass lesion, hydrocephalus, or extra-axial fluid collection. No other pathologic enhancement. Vermian atrophy again noted, likely  related to underlying alcohol abuse. Vascular: Normal flow voids are preserved within the major arterial intracranial vasculature. Normal flow voids and enhancement seen throughout the major dural sinuses. Skull and upper cervical spine: Craniocervical junction within normal limits. Bone marrow signal intensity normal. No scalp soft tissue abnormality. Sinuses/Orbits: Globes and orbital contents remain within normal limits. Markedly increased mucosal thickening with scattered air-fluid levels throughout the paranasal sinuses. Fluid fills the nasopharynx and right nasal cavity. Increased bilateral mastoid effusions. Findings likely related to interval intubation. Other: None. IMPRESSION: 1. Persistent findings consistent with PRES, overall mildly improved as compared to recent brain MRI from 05/14/2020. Previously seen associated  diffusion signal abnormality has largely resolved. 2. No other new acute intracranial abnormality. Electronically Signed   By: Rise MuBenjamin  McClintock M.D.   On: 05/19/2020 02:51   MR CERVICAL SPINE W WO CONTRAST  Result Date: 05/19/2020 CLINICAL DATA:  Initial evaluation for possible ventral epidural collection at the upper cervical spine. Patient with history of seizure and known PRES. EXAM: MRI CERVICAL SPINE WITHOUT AND WITH CONTRAST TECHNIQUE: Multiplanar and multiecho pulse sequences of the cervical spine, to include the craniocervical junction and cervicothoracic junction, were obtained without and with intravenous contrast. CONTRAST:  6mL GADAVIST GADOBUTROL 1 MMOL/ML IV SOLN COMPARISON:  Comparison made with concomitant brain MRI performed at the same time as well as previous MRIs from 05/14/2020 and 09/01/2019. Comparison also made with prior CT from 05/03/2015. FINDINGS: Alignment: Straightening of the normal cervical lordosis. No listhesis or malalignment. Vertebrae: Vertebral body height maintained without acute or chronic fracture. Bone marrow signal intensity within normal limits. No discrete or worrisome osseous lesions. No abnormal marrow edema or enhancement. No findings to suggest osteomyelitis discitis or septic arthritis. Cord: Normal signal and morphology. Questioned smoothly collection within the ventral epidural space posterior to the C2 through C4 vertebral bodies appears smoothly marginated without mass effect. Normal flow void seen coursing through this region, which appears contiguous with the adjacent perineural fat. Upon review of prior exams, appearance of this finding is relatively stable dating back to prior studies from 2016, and is felt to be most consistent with benign epidural fat. No other epidural collection identified. No abnormal enhancement. Posterior Fossa, vertebral arteries, paraspinal tissues: Paraspinous soft tissues within normal limits. Endotracheal and enteric tubes  partially visualized with associated fluid within the nasopharynx. Normal flow voids seen within the vertebral arteries bilaterally. Disc levels: No significant disc pathology seen within the cervical spine. No disc bulge or focal disc herniation. No canal or neural foraminal stenosis. No impingement. IMPRESSION: 1. Normal MRI appearance of the cervical spine. Previously question collection within the ventral epidural space at the upper cervical spine felt to be most consistent with epidural fat, relatively stable in appearance as compared to prior studies dating back to 2016. 2. No significant disc pathology or stenosis. No evidence for osteomyelitis discitis or septic arthritis. Electronically Signed   By: Rise MuBenjamin  McClintock M.D.   On: 05/19/2020 03:07   DG CHEST PORT 1 VIEW  Result Date: 05/18/2020 CLINICAL DATA:  Respiratory failure EXAM: PORTABLE CHEST 1 VIEW COMPARISON:  05/15/2020 FINDINGS: Endotracheal tube is unchanged, 16 mm above the carina. Nasogastric tube extends into the upper abdomen beyond the margin of the examination. Right upper extremity PICC line is been placed with its tip within the superior cavoatrial junction. There has developed left lower lobe collapse. Small left pleural effusion may be present. Right lung is clear. No pneumothorax. No pleural effusion on the right. IMPRESSION: Interval development  of left lower lobe collapse. Possible small left pleural effusion. Stable support tubes. Right upper extremity PICC line now identified, tip within the superior cavoatrial junction. Electronically Signed   By: Helyn Numbers MD   On: 05/18/2020 06:44

## 2020-05-19 NOTE — Progress Notes (Signed)
LTM EEG reviewed. Fewer sharply contoured discharges after switching propofol to Ketamine. No longer with periods of flat, low voltage. Increasing ketamine gtt to 2.4 mg/kg/hr.   Electronically signed: Dr. Caryl Pina

## 2020-05-19 NOTE — Progress Notes (Signed)
eLink Physician-Brief Progress Note Patient Name: Donna Short DOB: 06-08-1991 MRN: [id number]   Date of Service  05/19/2020  HPI/Events of Note  Blood pressure trending up likely due to Ketamine infusion.  eICU Interventions  Will monitor closely and intervene if it continues to trend upwards.        Thomasene Lot Vladislav Axelson 05/19/2020, 3:29 AM

## 2020-05-19 NOTE — Procedures (Signed)
Patient Name:Donna Short LPF:[id number] Epilepsy Attending:Demosthenes Virnig [name] Referring Physician/Provider:Dr Brooke Dare Duration:05/18/2020 [phone] to 9/25/[id number]  Patient history:29 year old female with history of alcohol abuse admitted with acute pancreatitis with hospital admission complicated with significantly elevated blood pressure, withdrawal, seizure and worsening mental status. EEG to evaluate for seizure.   Level of alertness:comatose  AEDs during EEG study:LEV  Technical aspects: This EEG study was done with scalp electrodes positioned according to the 10-20 International system of electrode placement. Electrical activity was acquired at a sampling rate of 500Hz  and reviewed with a high frequency filter of 70Hz  and a low frequency filter of 1Hz . EEG data were recorded continuously and digitally stored.   Description:No posterior dominant rhythm was seen. EEG initially showed bursts attenuation with generalized 3-4seconds of eeg suppression and bursts of 2-3 seconds of PLEDs plus in left posterior temporal region. After ketamine was started at around 1930 on 05/18/2020, eeg showed continuous generalized polymorphic 6-9hz  theta-alpha activity. Intermittent spikes were also noted in left posterior temporal region.  ABNORMALITY -Bursts attenuation with highly epileptiform discharges - Continuous slow, generalized - Spikes, left posterior temporal region.  IMPRESSION: This study initialyshowed PLEDs plus in left posterior temporal region. After ketamine was added on 9/24/201 after 1930, eeg improved and PLEds were replaced by intermittent spikes in the same region.  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 Overall,  eeg appears to be significantly improved.   [name] 

## 2020-05-19 NOTE — Progress Notes (Addendum)
Neurology Progress Note   S:// Seen and examined. Overnight EEG reviewed by overnight on-call neurologist and discussed over the phone with the epileptologist. Continued to have active looking EEG with frequent PLEDs without complete burst suppression. Triglycerides also came high-warranting propofol to be discontinued. Started on ketamine. Currently running at 2.5 mg/kg/h. Await overnight LTM EEG read  O:// Current vital signs: BP (!) 127/97   Pulse (!) 103   Temp 98.6 F (37 C) (Oral)   Resp (!) 0   Ht 5' 2"  (1.575 m)   Wt 64.4 kg   SpO2 100%   BMI 25.97 kg/m  Vital signs in last 24 hours: Temp:  [97.6 F (36.4 C)-98.9 F (37.2 C)] 98.6 F (37 C) (09/25 0351) Pulse Rate:  [91-106] 103 (09/25 0730) Resp:  [0-36] 0 (09/25 0730) BP: (98-135)/(64-110) 127/97 (09/25 0730) SpO2:  [99 %-100 %] 100 % (09/25 0730) FiO2 (%):  [40 %] 40 % (09/25 0100) Weight:  [64.4 kg] 64.4 kg (09/25 0424) Neurological exam-limited on sedation and unchanged from yesterday. Sedated with Versed and propofol Breathing with the ventilator Had weak gag overnight Pupils equal round reactive to light  Difficult to elicit corneal reflexes bilaterally No spontaneous movement No withdrawal to noxious stimulation  General: Sedated intubated HEENT: Normocephalic atraumatic with EEG leads on CVS: Regular rhythm Respiratory: Vented Extremities warm well perfused with trace edema.  Medications  Current Facility-Administered Medications:  .  0.9 %  sodium chloride infusion, , Intravenous, PRN, Caren Griffins, MD, Stopped at 05/17/20 2145 .  0.9 %  sodium chloride infusion, 250 mL, Intravenous, Continuous, Aventura, Emily T, MD, Last Rate: 10 mL/hr at 05/16/20 0639, 250 mL at 05/16/20 0639 .  acetaminophen (TYLENOL) tablet 650 mg, 650 mg, Per Tube, Q6H PRN **OR** acetaminophen (TYLENOL) suppository 650 mg, 650 mg, Rectal, Q6H PRN, Ramaswamy, Murali, MD .  albuterol (VENTOLIN HFA) 108 (90 Base) MCG/ACT  inhaler 1-2 puff, 1-2 puff, Inhalation, Q6H PRN, Chotiner, Yevonne Aline, MD .  chlorhexidine gluconate (MEDLINE KIT) (PERIDEX) 0.12 % solution 15 mL, 15 mL, Mouth Rinse, BID, Ramaswamy, Murali, MD, 15 mL at 05/18/20 2000 .  Chlorhexidine Gluconate Cloth 2 % PADS 6 each, 6 each, Topical, Daily, Caren Griffins, MD, 6 each at 05/17/20 1748 .  cloBAZam (ONFI) tablet 5 mg, 5 mg, Per Tube, BID, Eliezer Bottom T, MD, 5 mg at 05/18/20 2109 .  dextrose 10 % infusion, , Intravenous, Continuous, Ogan, Okoronkwo U, MD, Last Rate: 75 mL/hr at 05/19/20 0500, Rate Verify at 05/19/20 0500 .  docusate (COLACE) 50 MG/5ML liquid 100 mg, 100 mg, Per Tube, BID, Brand Males, MD, 100 mg at 05/18/20 2108 .  enoxaparin (LOVENOX) injection 40 mg, 40 mg, Subcutaneous, QHS, Ramaswamy, Murali, MD, 40 mg at 05/18/20 2109 .  feeding supplement (PROSource TF) liquid 45 mL, 45 mL, Per Tube, QID, Brand Males, MD, 45 mL at 05/18/20 2108 .  feeding supplement (VITAL HIGH PROTEIN) liquid 1,000 mL, 1,000 mL, Per Tube, Q24H, Ramaswamy, Murali, MD, 1,000 mL at 05/18/20 1208 .  fentaNYL (SUBLIMAZE) injection 50 mcg, 50 mcg, Intravenous, Q15 min PRN, Brand Males, MD .  folic acid (FOLVITE) tablet 1 mg, 1 mg, Per Tube, Daily, Brand Males, MD, 1 mg at 05/18/20 0903 .  HYDROmorphone (DILAUDID) injection 1 mg, 1 mg, Intravenous, Q3H PRN, Chotiner, Yevonne Aline, MD, 1 mg at 05/14/20 0050 .  ketamine (KETALAR) 1,250 mg in sodium chloride 0.9 % 250 mL (5 mg/mL) infusion, 2.4 mg/kg/hr, Intravenous, Continuous, Brand Males, MD .  levETIRAcetam (KEPPRA) IVPB 1000 mg/100 mL premix, 1,000 mg, Intravenous, Q12H, Judd Lien, MD, Stopped at 05/18/20 2107 .  LORazepam (ATIVAN) injection 2 mg, 2 mg, Intravenous, Once, Carmin Muskrat, MD .  MEDLINE mouth rinse, 15 mL, Mouth Rinse, 10 times per day, Brand Males, MD, 15 mL at 05/19/20 0539 .  midazolam (VERSED) 500 mg in sodium chloride 0.9 % 500 mL (1 mg/mL) infusion,  90 mg/hr, Intravenous, Continuous, Kerney Elbe, MD, Last Rate: 90 mL/hr at 05/19/20 0525, 90 mg/hr at 05/19/20 0525 .  midazolam (VERSED) injection 1-4 mg, 1-4 mg, Intravenous, Q2H PRN, Brand Males, MD, 4 mg at 05/16/20 1011 .  midazolam (VERSED) injection 2 mg, 2 mg, Intravenous, Q15 min PRN, Brand Males, MD, 2 mg at 05/15/20 2003 .  midazolam (VERSED) injection 2 mg, 2 mg, Intravenous, Q2H PRN, Brand Males, MD .  multivitamin with minerals tablet 1 tablet, 1 tablet, Per Tube, Daily, Genevive Bi, Shona Needles, MD, 1 tablet at 05/18/20 0903 .  ondansetron (ZOFRAN) tablet 4 mg, 4 mg, Per Tube, Q6H PRN **OR** ondansetron (ZOFRAN) injection 4 mg, 4 mg, Intravenous, Q6H PRN, Chase Caller, Murali, MD .  pantoprazole (PROTONIX) injection 40 mg, 40 mg, Intravenous, QHS, Ramaswamy, Murali, MD, 40 mg at 05/18/20 2108 .  phenylephrine (NEOSYNEPHRINE) 10-0.9 MG/250ML-% infusion, 25-200 mcg/min, Intravenous, Titrated, Aventura, Emily T, MD, Last Rate: 22.5 mL/hr at 05/18/20 2029, 15 mcg/min at 05/18/20 2029 .  piperacillin-tazobactam (ZOSYN) IVPB 3.375 g, 3.375 g, Intravenous, Q8H, Thea Gist, RPH, Last Rate: 12.5 mL/hr at 05/19/20 0500, Rate Verify at 05/19/20 0500 .  polyethylene glycol (MIRALAX / GLYCOLAX) packet 17 g, 17 g, Per Tube, Daily, Brand Males, MD, 17 g at 05/18/20 0903 .  sodium chloride flush (NS) 0.9 % injection 10-40 mL, 10-40 mL, Intracatheter, Q12H, Ramaswamy, Murali, MD, 10 mL at 05/18/20 2100 .  sodium chloride flush (NS) 0.9 % injection 10-40 mL, 10-40 mL, Intracatheter, PRN, Brand Males, MD .  thiamine tablet 100 mg, 100 mg, Per Tube, Daily, 100 mg at 05/18/20 0903 **OR** thiamine (B-1) injection 100 mg, 100 mg, Intravenous, Daily, Brand Males, MD Labs CBC    Component Value Date/Time   WBC 7.8 05/18/2020 0540   RBC 2.45 (L) 05/18/2020 0540   HGB 8.6 (L) 05/18/2020 0540   HCT 26.4 (L) 05/18/2020 0540   PLT 155 05/18/2020 0540   MCV 107.8 (H) 05/18/2020  0540   MCH 35.1 (H) 05/18/2020 0540   MCHC 32.6 05/18/2020 0540   RDW 11.9 05/18/2020 0540   LYMPHSABS 1.5 11/24/2019 1151   MONOABS 0.8 11/24/2019 1151   EOSABS 0.1 11/24/2019 1151   BASOSABS 0.0 11/24/2019 1151    CMP     Component Value Date/Time   NA 139 05/18/2020 0540   K 4.0 05/18/2020 0540   CL 113 (H) 05/18/2020 0540   CO2 17 (L) 05/18/2020 0540   GLUCOSE 97 05/18/2020 0540   BUN 10 05/18/2020 0540   CREATININE 0.68 05/18/2020 0540   CALCIUM 7.3 (L) 05/18/2020 0540   PROT 4.8 (L) 05/18/2020 0540   ALBUMIN 1.8 (L) 05/18/2020 0540   AST 21 05/18/2020 0540   ALT 21 05/18/2020 0540   ALKPHOS 20 (L) 05/18/2020 0540   BILITOT 0.6 05/18/2020 0540   GFRNONAA >60 05/18/2020 0540   GFRAA >60 05/18/2020 0540   ANA + (unclear significance)  Imaging I have reviewed images in epic and the results pertinent to this consultation are: MRI of the brain consistent with findings of posterior reversible encephalopathy syndrome  involving both hemispheres pretty symmetrically anteriorly and posteriorly. Repeat MRI yesterday with clearing of the diffusion abnormality but persistent flair changes consistent with posterior reversible encephalopathy syndrome.  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  EEG consistent with a PLEDs plus pattern suggesting high epileptogenicity. Currently on levetiracetam, phenytoin, Onfi Also on sedation with Versed and propofol. Propofol needed to be discontinued and switched to ketamine.  Once burst suppression is obtained consistently, goal would be to burst suppress for 24 to 48 hours and then wean off sedation while controlling the blood pressures as you are and reevaluate.  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 Appreciate pharmacy assistance. Hold when the levels are in therapeutic range and resume likely at a lower dose-we will continue to follow. -Onfi -Versed drip -Propofol drip ketamine drip Not been Phenobarbital (mistakenly written in my note) Will adjust sedation drip rates based on EEG over the course of the day. Work-up for pancreatitis per primary team as you are.  Discussed plan with Dr. Chase Caller on the unit.  -- Amie Portland, MD [facility] Pager: [phone] If 7pm to 7am, please call on call as listed on [facility].  Addendum -After the initiation of ketamine around 7:30 PM yesterday, EEG improved and PLEDs were replaced by intermittent spikes in the same region.  Additionally severe diffuse encephalopathy.  Overall EEG appears to be significantly improved. -Replace Onfi with Phenobarb-load with 71m/kg and then start 615mBID tonight.   CRITICAL CARE ATTESTATION Performed by: AsAmie PortlandMD Total critical care time: 33 minutes Critical care time was exclusive of separately billable procedures and treating other patients and/or supervising APPs/Residents/Students Critical care was necessary to treat or prevent imminent or life-threatening deterioration due to posterior reversible encephalopathy syndrome, status epilepticus This patient is critically ill and at significant risk for neurological worsening and/or death and care requires constant monitoring. Critical care was time spent personally by me on the following activities: development of treatment plan with patient and/or surrogate as well as nursing, discussions with consultants, evaluation of patient's response to treatment, examination of patient, obtaining history from patient or surrogate, ordering and performing treatments and interventions, ordering and review of laboratory studies, ordering and review of radiographic studies, pulse oximetry, re-evaluation of patient's condition, participation  in multidisciplinary rounds and medical decision making of high complexity in the care of this patient.

## 2020-05-20 ENCOUNTER — Inpatient Hospital Stay (HOSPITAL_COMMUNITY): Payer: Managed Care, Other (non HMO)

## 2020-05-20 LAB — COMPREHENSIVE METABOLIC PANEL
ALT: 16 U/L (ref 0–44)
AST: 18 U/L (ref 15–41)
Albumin: 1.8 g/dL — ABNORMAL LOW (ref 3.5–5.0)
Alkaline Phosphatase: 22 U/L — ABNORMAL LOW (ref 38–126)
Anion gap: 8 (ref 5–15)
BUN: 15 mg/dL (ref 6–20)
CO2: 16 mmol/L — ABNORMAL LOW (ref 22–32)
Calcium: 8.4 mg/dL — ABNORMAL LOW (ref 8.9–10.3)
Chloride: 114 mmol/L — ABNORMAL HIGH (ref 98–111)
Creatinine, Ser: 0.7 mg/dL (ref 0.44–1.00)
GFR calc Af Amer: >60 mL/min (ref >60)
GFR calc non Af Amer: >60 mL/min (ref >60)
Glucose, Bld: 145 mg/dL — ABNORMAL HIGH (ref 70–99)
Potassium: 4 mmol/L (ref 3.5–5.1)
Sodium: 138 mmol/L (ref 135–145)
Total Bilirubin: 0.3 mg/dL (ref 0.3–1.2)
Total Protein: 5.1 g/dL — ABNORMAL LOW (ref 6.5–8.1)

## 2020-05-20 LAB — PHENYTOIN LEVEL, TOTAL: Phenytoin Lvl: 11.9 ug/mL (ref 10.0–20.0)

## 2020-05-20 LAB — CK TOTAL AND CKMB (NOT AT ARMC)
CK, MB: 1 ng/mL (ref 0.5–5.0)
Relative Index: 0.7 (ref 0.0–2.5)
Total CK: 140 U/L (ref 38–234)

## 2020-05-20 LAB — GLUCOSE, CAPILLARY
Glucose-Capillary: 137 mg/dL — ABNORMAL HIGH (ref 70–99)
Glucose-Capillary: 133 mg/dL — ABNORMAL HIGH (ref 70–99)
Glucose-Capillary: 110 mg/dL — ABNORMAL HIGH (ref 70–99)
Glucose-Capillary: 115 mg/dL — ABNORMAL HIGH (ref 70–99)
Glucose-Capillary: 63 mg/dL — ABNORMAL LOW (ref 70–99)
Glucose-Capillary: 114 mg/dL — ABNORMAL HIGH (ref 70–99)
Glucose-Capillary: 98 mg/dL (ref 70–99)
Glucose-Capillary: 89 mg/dL (ref 70–99)
Glucose-Capillary: 66 mg/dL — ABNORMAL LOW (ref 70–99)
Glucose-Capillary: 80 mg/dL (ref 70–99)
Glucose-Capillary: 74 mg/dL (ref 70–99)
Glucose-Capillary: 76 mg/dL (ref 70–99)

## 2020-05-20 LAB — MAGNESIUM: Magnesium: 1.6 mg/dL — ABNORMAL LOW (ref 1.7–2.4)

## 2020-05-20 LAB — LACTIC ACID, PLASMA: Lactic Acid, Venous: 0.8 mmol/L (ref 0.5–1.9)

## 2020-05-20 LAB — PHOSPHORUS: Phosphorus: 5.3 mg/dL — ABNORMAL HIGH (ref 2.5–4.6)

## 2020-05-20 MED ORDER — ALBUMIN HUMAN 5 % IV SOLN
25.0000 g | Freq: Once | INTRAVENOUS | Status: AC
  Administered 2020-05-20: 25 g via INTRAVENOUS
  Filled 2020-05-20: qty 500

## 2020-05-20 MED ORDER — SODIUM CHLORIDE 0.9 % IV BOLUS
500.0000 mL | Freq: Once | INTRAVENOUS | Status: AC
  Administered 2020-05-20: 500 mL via INTRAVENOUS

## 2020-05-20 MED ORDER — MAGNESIUM SULFATE 50 % IJ SOLN
6.0000 g | Freq: Once | INTRAVENOUS | Status: AC
  Administered 2020-05-20: 6 g via INTRAVENOUS
  Filled 2020-05-20: qty 12

## 2020-05-20 MED ORDER — DEXTROSE 50 % IV SOLN
INTRAVENOUS | Status: AC
  Administered 2020-05-20: 50 mL
  Filled 2020-05-20: qty 50

## 2020-05-20 MED ORDER — PANTOPRAZOLE SODIUM 40 MG PO PACK
40.0000 mg | PACK | Freq: Every day | ORAL | Status: DC
  Administered 2020-05-20 – 2020-05-30 (×11): 40 mg
  Filled 2020-05-20 (×12): qty 20

## 2020-05-20 NOTE — Progress Notes (Signed)
NAME:  Donna Short, MRN:  [id number], DOB:  1991-08-04, LOS: 9 ADMISSION DATE:  05/10/2020, CONSULTATION DATE:  05/14/20 REFERRING MD:  Loney Loh, CHIEF COMPLAINT:  Encephalopathy   Brief History   Donna Short is a 29 y.o. female with a PMHx of EtOH abuse and HTN admitted to [facility] 9/17 for acute on chronic pancreatitis and EtOH withdrawal. During her hospital stay she developed uncontrolled HTN and breakthrough seizure activity with MRI findings consistent with PRES.   Past Medical History   has a past medical history of Asthma, Depression, History of chicken pox, Hypertension, Migraines, Pancreatitis (01/2018), and Seizures (HCC). Allergies  Allergen Reactions  . Claritin [Loratadine] Itching  . Peanut-Containing Drug Products Other (See Comments)  . Benadryl [Diphenhydramine Hcl (Sleep)] Rash  . Latex Rash  . Shrimp [Shellfish Allergy] Hives and Rash     Significant Hospital Events   9/16 - admit 9/18, 9/19 breakthrough seizures 9/20- MRI showing PRES 9/21 -Patient had a hypoglycemic episode with BG 61 and shaking overnight consistent with previous episodes past 2 days. Symptoms responded well to 8 oz of juice and BG improved to 72 within 30 minutes. Likely due to poor oral intake intubation for burst suppression  9/22  - 9/22 - intubated yesterday for burst suppressio needs -> post intubation hypothermic/then febrile 102F with hypotension/pressor need. Off cleviprex -> this morning off pressors.  Continued hypoglycemia -> needing more D5. Not yet on TF. Making urine. Neuro Dr Wilford Corner concerned about recurrent PRES (milder in Jan 2021). Wants to consider LP if fever persists and culture negative. Currently on diprivan gtt and MAP at goal - 80-90  9/23 -  9/23  EEG with continued PLED per neuro. Versed increaed from 30mg /h to 40mg /h. MAx diprivan. On keppra On neop. On d5 normal saline. On TF. 40% fio2 on vent. ECHO normal. No DVT on duplex. Afebrile. MAP at 90 at goal. No labs in  results today. AUtoimmune sent yestserday. UDS Sent yesterday. Off cleviprex  9/24 - on 40% FiO2.  On ventilator.  On low-dose Neo-Synephrine to keep the MAP between 80 and 90.  Not needing Cleviprex.  Has increased Versed needs.  Currently on Versed 90 mg/h and propofol 90 mcg.  Autoimmune profile negative except for weak positive ANA.  Has hypoglycemia despite tube feeds.  Started on D10 -infusion  9/25 - remains on 40% oxygen and PEEP of 5.  On the ventilator.  RASS sedation score -5.  Propofol has been stopped because of increased triglycerides.  Continues on Versed infusion ketamine has been started.  Neuro electrophysiologist -EEG showed improved PLEDs and now replaced by intermittent spikes.  Ongoing encephalopathy present.  Overall EEG significantly improved.  Also on phenobarb and Keppra.  Dilantin still on hold. Currently off neo because mean arterial pressure is automatically between 80 and 90  Consults:  Neurology- consulted 9/20, recommended maintaining strict BP control with MAP goals 80-90, Keppra 1g 2x daily for seizure control  Procedures:  ETT 9/21 PICC 9/22   Significant Diagnostic Tests:  9!6 = Abdominal CT- findings consistent with acute on chronic pancreatitis predominantly involving the proximal pancreas. No peripancreatic fluid collections are identified. EEG- 9/20, evidence of epileptogenicity arising from left posterior temporal region as well as cortical dysfunction in left hemisphere due to underlying structural abnormality, PRES MRI- 9/20, patchy/extensive brain edema in a pattern consistent with PRES 9/22 - echo and duplex - normal  Micro Data:  SARS 9/16 - neg Trach 9/23 - nl flora Urine  9/22 - neg Blood 9/22 - neg so far MRSa PCR 9/20 -neg Autoimmune and vasculitis panel 9/22 >> ANA weak positive but  Otherwise neg U Tox 9/22 - positive benzo (Versed infusion)  Antimicrobials:  Aztreoniam 9/22 (empiric sepsis)>> 9/22, Zosyn 9/22 >> 05/19/2020  Interim  history/subjective:    05/20/2020 -eeg even more improved intermittent hypoglycemia continues.  On ventilator 40% FiO2.  On tube feeds.  Sedated with Versed and ketamine infusions and antiepileptics.  Neurology continuing plan.  Objective   Blood pressure 106/76, pulse (!) 108, temperature (!) 97.1 F (36.2 C), temperature source Axillary, resp. rate 19, height 5\' 2"  (1.575 m), weight 63.1 kg, SpO2 100 %.    Vent Mode: PRVC FiO2 (%):  [40 %] 40 % Set Rate:  [18 bmp] 18 bmp Vt Set:  [420 mL] 420 mL PEEP:  [5 cmH20] 5 cmH20 Plateau Pressure:  [17 cmH20-22 cmH20] 22 cmH20   Intake/Output Summary (Last 24 hours) at 05/20/2020 1515 Last data filed at 05/20/2020 1500 Gross per 24 hour  Intake 5651.07 ml  Output 3100 ml  Net 2551.07 ml   Filed Weights   05/18/20 0545 05/19/20 0424 05/20/20 0500  Weight: 60.3 kg 64.4 kg 63.1 kg   Critically ill looking young lady.  Deeply sedated on the ventilator.  RASS sedation score -5.  Synchronous with the ventilator at 40%.  Has a 05/22/20 on clear to auscultation bilaterally normal heart sounds.  Abdomen soft.  Tolerating tube feeds.        Resolved Hospital Problem list   AKI- initially presented with Cr 1.4, baseline .7-.9, normalized following administration of IV fluids    Assessment & Plan:     ASSESSMENT / PLAN:   A:  Acute Resp Failure - s/p intubation for burst suppression 05/14/20   05/20/2020 - > does not meet criteria for SBT/Extubation in setting of Acute Respiratory Failure due to inducd coma  P:   Full mechanical ventilator support VAP protocool    A:   PRES recurrence with seizures 9/19., Confirmed on MRI 9/20 . Worse compared to Jan 2021.  Diprivan stopped on May 18, 2020 secondary to high triglycerides  05/20/2020 - RASS -5.  On high-dose Versed infusion and also ketamine infusion.  On Keppra and phenobarb EEG with improvement.  P:   Per neuro EEG  Versed gtt and ketamine gtt  for sedation and PLED  conrol Keppra and phenobarb per neuro Neuro holding dilantin    A:   Hypertensive all admission needing cleviprex Hypotensive post intubatioin 9/21 and ?  Sepsis - needing transient pressors  -Mean arterial pressure at goal between 80 and 90.  Not needing Neo-Synephrine.  Not needing Cleviprex  P:  MAP goal 80-90 (currently there with diprivan gtt and neo-) Remove neo from the [facility]    A: Normal heart on echo 2019.  Repeat echocardiogram May 16, 2020 is normal    P: Monitor    A: In Sinus  QTC 472 msec on May 16, 2020  P: Monitor QTc prn -    A:   Hypothermic and then febrile 9/21-9/22 with lactic acidosis 3 MRSA PCR negative.  Urine strep negative Lactic acodiss + -resolved 05/17/2020 Off zosyn 9/25  05/20/2020 - afebrle. PCT Normal.  Lactic acid normal.  So far culture negative,.  P:   Stop Zosyn MR or LP based on course (per neuro)   A:  AKI resolved 9/17  P:  monitor   A:  Hypomagnesemia  P: Replete  magnesium    A:   Pancreatitis (acute on chronic ) - etoh 05/10/2020 - admit based on CT with lipase 101 at admit  05/20/2020 - tolerating TF  P:   TF   A:  anemia critical illness   05/20/2020 - no active bleeding  P:  - PRBC for hgb </= 6.9gm%    - exceptions are   -  if ACS susepcted/confirmed then transfuse for hgb </= 8.0gm%,  or    -  active bleeding with hemodynamic instability, then transfuse regardless of hemoglobin value   At at all times try to transfuse 1 unit prbc as possible with exception of active hemorrhage   HEMATOLOGIC - Platelets A Transient Thrombocytopenia [Baseline platelets 150-200; thrombocytopenia 80s in [facility]Arch 2021) - 9/20 - 9/24 and resolved.  Duplex negative.  HIT antibody panel - 05/16/2020  P Continue lovenox    ENDOCRINE A:   Hypoglycemia - needing D5NS  -Ongoing despite tube feeds on 05/18/2020.  Improved 05/19/2020 after adding D10   P:   Continue TF D10 to  continue SSI  MSK/DERM AT risk for Sacral deub-909/25/21 per nursing At risk for rhabdo due to diprivan -high triglycerides and to prevent stopped 05/18/2020.   05/20/2020  - CK and lacate normal Plan  - nursing care - monitor CK and lactate as prn  Best practice:  Diet: start TF Pain/Anxiety/Delirium protocol (if indicated): diprivan gtt + versed gtt VAP protocol (if indicated): N/a DVT prophylaxis: Lovenox GI prophylaxis: N/a Glucose control: ssi Mobility: bedrest Code Status: Full code Family Communication:  FMLA form done 9/21. Mom Meryl Dare [phone]) updated 9/21 at bedside and over phone 05/16/20 and again 9/23 over phone and 05/19/20. Mom was updated by nursing 05/20/20  Disposition: Continued ICU care     ATTESTATION & SIGNATURE   The patient Donna Short is critically ill with multiple organ systems failure and requires high complexity decision making for assessment and support, frequent evaluation and titration of therapies, application of advanced monitoring technologies and extensive interpretation of multiple databases.   Critical Care Time devoted to patient care services described in this note is  35  Minutes. This time reflects time of care of this signee Dr Kalman Shan. This critical care time does not reflect procedure time, or teaching time or supervisory time of PA/NP/Med student/Med Resident etc but could involve care discussion time     Dr. Kalman Shan, M.D., [facility].C.P Pulmonary and Critical Care Medicine Staff Physician [facility] [place] [facility] Pager: [phone], If no answer or between  15:00h - 7:00h: call [phone]  05/20/2020 3:34 PM    LABS    PULMONARY Recent Labs  Lab 05/15/20 2205  PHART 7.533*  PCO2ART 34.6  PO2ART 589*  HCO3 29.7*  TCO2 31  O2SAT 100.0    CBC Recent Labs  Lab 05/17/20 1004 05/18/20 0540 05/19/20 2324  HGB 9.3* 8.6* 9.2*  HCT 28.0* 26.4* 28.8*  WBC  9.2 7.8 5.8  PLT 130* 155 201    COAGULATION No results for input(s): INR in the last 168 hours.  CARDIAC  No results for input(s): TROPONINI in the last 168 hours. No results for input(s): PROBNP in the last 168 hours.   CHEMISTRY Recent Labs  Lab 05/14/20 0134 05/14/20 0134 05/14/20 1444 05/15/20 0123 05/15/20 0123 05/15/20 2205 05/16/20 0021 05/16/20 0021 05/17/20 1004 05/17/20 1004 05/18/20 0540 05/19/20 0421 05/20/20 0527  NA 134*  --    < >  134*   < > 138 133*  --  137  --  139  --  138  K 2.9*   < >   < > 3.6   < > 3.4* 3.2*   < > 4.3   < > 4.0  --  4.0  CL 96*  --    < > 97*  --   --  101  --  112*  --  113*  --  114*  CO2 22  --    < > 22  --   --  20*  --  18*  --  17*  --  16*  GLUCOSE 99  --    < > 107*  --   --  182*  --  167*  --  97  --  145*  BUN 13  --    < > 11  --   --  9  --  9  --  10  --  15  CREATININE 0.76  --    < > 0.66  --   --  0.80  --  0.66  --  0.68  --  0.70  CALCIUM 9.1  --    < > 9.6  --   --  9.2  --  7.5*  --  7.3*  --  8.4*  MG 1.5*  --   --   --   --   --  1.5*  --  1.8  --  1.5*  --  1.6*  PHOS 3.2   < >  --   --   --   --  4.1  --  3.5  --  3.8 5.2* 5.3*   < > = values in this interval not displayed.   Estimated Creatinine Clearance: 89 mL/min (by C-G formula based on SCr of 0.7 mg/dL).   LIVER Recent Labs  Lab 05/15/20 0123 05/16/20 0021 05/17/20 1004 05/18/20 0540 05/20/20 0527  AST 49* 49* 26 21 18   ALT 31 32 24 21 16   ALKPHOS 33* 30* 22* 20* 22*  BILITOT 0.5 0.9 0.5 0.6 0.3  PROT 7.0 6.5 5.1* 4.8* 5.1*  ALBUMIN 3.5 3.2* 2.1* 1.8* 1.8*     INFECTIOUS Recent Labs  Lab 05/16/20 0946 05/16/20 0946 05/17/20 1004 05/18/20 0540 05/20/20 0527  LATICACIDVEN 3.6*   < > 1.5 1.0 0.8  PROCALCITON <0.10  --   --   --   --    < > = values in this interval not displayed.     ENDOCRINE CBG (last 3)  Recent Labs    05/20/20 1110 05/20/20 1156 05/20/20 1511  GLUCAP 63* 114* 98         IMAGING x48h  -  image(s) personally visualized  -   highlighted in bold MR BRAIN W WO CONTRAST  Result Date: 05/19/2020 CLINICAL DATA:  Follow-up examination for seizure. History of alcohol abuse. Known PRES. EXAM: MRI HEAD WITHOUT AND WITH CONTRAST TECHNIQUE: Multiplanar, multiecho pulse sequences of the brain and surrounding structures were obtained without and with intravenous contrast. CONTRAST:  6mL GADAVIST GADOBUTROL 1 MMOL/ML IV SOLN COMPARISON:  Recent MRI from 05/14/2020. FINDINGS: Brain: Again seen are patchy cortical subcortical T2/FLAIR hyperintensity involving the bilateral frontal, parietal, temporal, and occipital regions bilaterally. Changes most pronounced at the right greater than left frontal regions and left temporal occipital region. Mild patchy involvement of the cerebellum again noted. Minimal patchy involvement of the left thalamus. Overall, appearance is somewhat improved as  compared to recent brain MRI from 05/14/2020. Previously seen associated diffusion abnormality has nearly resolved. No real significant associated enhancement to speak of. No evidence for associated hemorrhage on SWI sequence. No significant regional mass effect or midline shift. No evidence for acute or interval vascular infarct. No mass lesion, hydrocephalus, or extra-axial fluid collection. No other pathologic enhancement. Vermian atrophy again noted, likely related to underlying alcohol abuse. Vascular: Normal flow voids are preserved within the major arterial intracranial vasculature. Normal flow voids and enhancement seen throughout the major dural sinuses. Skull and upper cervical spine: Craniocervical junction within normal limits. Bone marrow signal intensity normal. No scalp soft tissue abnormality. Sinuses/Orbits: Globes and orbital contents remain within normal limits. Markedly increased mucosal thickening with scattered air-fluid levels throughout the paranasal sinuses. Fluid fills the nasopharynx and right nasal  cavity. Increased bilateral mastoid effusions. Findings likely related to interval intubation. Other: None. IMPRESSION: 1. Persistent findings consistent with PRES, overall mildly improved as compared to recent brain MRI from 05/14/2020. Previously seen associated diffusion signal abnormality has largely resolved. 2. No other new acute intracranial abnormality. Electronically Signed   By: Rise Mu M.D.   On: 05/19/2020 02:51   MR CERVICAL SPINE W WO CONTRAST  Result Date: 05/19/2020 CLINICAL DATA:  Initial evaluation for possible ventral epidural collection at the upper cervical spine. Patient with history of seizure and known PRES. EXAM: MRI CERVICAL SPINE WITHOUT AND WITH CONTRAST TECHNIQUE: Multiplanar and multiecho pulse sequences of the cervical spine, to include the craniocervical junction and cervicothoracic junction, were obtained without and with intravenous contrast. CONTRAST:  35mL GADAVIST GADOBUTROL 1 MMOL/ML IV SOLN COMPARISON:  Comparison made with concomitant brain MRI performed at the same time as well as previous MRIs from 05/14/2020 and 09/01/2019. Comparison also made with prior CT from 05/03/2015. FINDINGS: Alignment: Straightening of the normal cervical lordosis. No listhesis or malalignment. Vertebrae: Vertebral body height maintained without acute or chronic fracture. Bone marrow signal intensity within normal limits. No discrete or worrisome osseous lesions. No abnormal marrow edema or enhancement. No findings to suggest osteomyelitis discitis or septic arthritis. Cord: Normal signal and morphology. Questioned smoothly collection within the ventral epidural space posterior to the C2 through C4 vertebral bodies appears smoothly marginated without mass effect. Normal flow void seen coursing through this region, which appears contiguous with the adjacent perineural fat. Upon review of prior exams, appearance of this finding is relatively stable dating back to prior studies from  2016, and is felt to be most consistent with benign epidural fat. No other epidural collection identified. No abnormal enhancement. Posterior Fossa, vertebral arteries, paraspinal tissues: Paraspinous soft tissues within normal limits. Endotracheal and enteric tubes partially visualized with associated fluid within the nasopharynx. Normal flow voids seen within the vertebral arteries bilaterally. Disc levels: No significant disc pathology seen within the cervical spine. No disc bulge or focal disc herniation. No canal or neural foraminal stenosis. No impingement. IMPRESSION: 1. Normal MRI appearance of the cervical spine. Previously question collection within the ventral epidural space at the upper cervical spine felt to be most consistent with epidural fat, relatively stable in appearance as compared to prior studies dating back to 2016. 2. No significant disc pathology or stenosis. No evidence for osteomyelitis discitis or septic arthritis. Electronically Signed   By: Rise Mu M.D.   On: 05/19/2020 03:07   DG CHEST PORT 1 VIEW  Result Date: 05/20/2020 CLINICAL DATA:  Respiratory failure. EXAM: PORTABLE CHEST 1 VIEW COMPARISON:  05/18/2020 FINDINGS: An endotracheal tube  with tip 1.3 cm above the carina, NG tube entering the stomach and RIGHT PICC line with tip overlying the LOWER SVC again noted. LEFT LOWER lung consolidation/atelectasis again noted. There is no evidence of pneumothorax. No significant change noted. IMPRESSION: Unchanged appearance of the chest with LEFT LOWER lung consolidation/atelectasis. Electronically Signed   By: Harmon Pier M.D.   On: 05/20/2020 07:24

## 2020-05-20 NOTE — Progress Notes (Signed)
Neurology Progress Note   S:// Seen and examined. LTM Read pending Bedside review with attenuated with no spikes.  O:// Current vital signs: BP 107/86   Pulse 88   Temp (!) 97.5 F (36.4 C) (Oral)   Resp 18   Ht 5' 2" (1.575 m)   Wt 63.1 kg   SpO2 100%   BMI 25.44 kg/m  Vital signs in last 24 hours: Temp:  [96 F (35.6 C)-97.5 F (36.4 C)] 97.5 F (36.4 C) (09/26 0700) Pulse Rate:  [82-106] 88 (09/26 0730) Resp:  [0-21] 18 (09/26 0730) BP: (86-134)/(59-106) 107/86 (09/26 0730) SpO2:  [99 %-100 %] 100 % (09/26 0730) FiO2 (%):  [40 %] 40 % (09/26 0136) Weight:  [63.1 kg] 63.1 kg (09/26 0500) Neurological exam-limited on sedation and unchanged from yesterday. Sedated with Versed and propofol Breathing with the ventilator Had weak gag overnight Pupils equal round reactive to light  No corneals No spontaneous movement No withdrawal to noxious stimulation  General: Sedated intubated HEENT: Normocephalic atraumatic with EEG leads on CVS: Regular rhythm Respiratory: Vented Extremities warm well perfused with trace edema.  Medications  Current Facility-Administered Medications:  .  0.9 %  sodium chloride infusion, , Intravenous, PRN, Caren Griffins, MD, Stopped at 05/17/20 2145 .  0.9 %  sodium chloride infusion, 250 mL, Intravenous, Continuous, A[place]ura, Emily T, MD, Last Rate: 10 mL/hr at 05/16/20 0639, 250 mL at 05/16/20 0639 .  acetaminophen (TYLENOL) tablet 650 mg, 650 mg, Per Tube, Q6H PRN **OR** acetaminophen (TYLENOL) suppository 650 mg, 650 mg, Rectal, Q6H PRN, Ramaswamy, Murali, MD .  albuterol (VENTOLIN HFA) 108 (90 Base) MCG/ACT inhaler 1-2 puff, 1-2 puff, Inhalation, Q6H PRN, Chotiner, Yevonne Aline, MD .  chlorhexidine gluconate (MEDLINE KIT) (PERIDEX) 0.12 % solution 15 mL, 15 mL, Mouth Rinse, BID, Ramaswamy, Murali, MD, 15 mL at 05/19/20 1958 .  Chlorhexidine Gluconate Cloth 2 % PADS 6 each, 6 each, Topical, Daily, Caren Griffins, MD, 6 each at 05/19/20  1238 .  dextrose 10 % infusion, , Intravenous, Continuous, Ogan, Okoronkwo U, MD, Last Rate: 75 mL/hr at 05/20/20 0700, Rate Verify at 05/20/20 0700 .  docusate (COLACE) 50 MG/5ML liquid 100 mg, 100 mg, Per Tube, BID, Brand Males, MD, 100 mg at 05/19/20 0947 .  enoxaparin (LOVENOX) injection 40 mg, 40 mg, Subcutaneous, QHS, Ramaswamy, Murali, MD, 40 mg at 05/19/20 2151 .  feeding supplement (PROSource TF) liquid 45 mL, 45 mL, Per Tube, QID, Brand Males, MD, 45 mL at 05/19/20 2151 .  feeding supplement (VITAL HIGH PROTEIN) liquid 1,000 mL, 1,000 mL, Per Tube, Q24H, Ramaswamy, Murali, MD, 1,000 mL at 05/19/20 1132 .  fentaNYL (SUBLIMAZE) injection 50 mcg, 50 mcg, Intravenous, Q15 min PRN, Brand Males, MD .  folic acid (FOLVITE) tablet 1 mg, 1 mg, Per Tube, Daily, Brand Males, MD, 1 mg at 05/19/20 0946 .  HYDROmorphone (DILAUDID) injection 1 mg, 1 mg, Intravenous, Q3H PRN, Chotiner, Yevonne Aline, MD, 1 mg at 05/14/20 0050 .  ketamine (KETALAR) 1,250 mg in sodium chloride 0.9 % 250 mL (5 mg/mL) infusion, 2.4 mg/kg/hr, Intravenous, Continuous, Ramaswamy, Murali, MD, Last Rate: 30.9 mL/hr at 05/20/20 0700, 2.4 mg/kg/hr at 05/20/20 0700 .  levETIRAcetam (KEPPRA) IVPB 1500 mg/ 100 mL premix, 1,500 mg, Intravenous, Q12H, Amie Portland, MD, Stopped at 05/19/20 2112 .  magnesium sulfate 6 g in dextrose 5 % 100 mL IVPB, 6 g, Intravenous, Once, Ramaswamy, Murali, MD .  MEDLINE mouth rinse, 15 mL, Mouth Rinse, 10 times per day,  Brand Males, MD, 15 mL at 05/20/20 9562 .  midazolam (VERSED) 500 mg in sodium chloride 0.9 % 500 mL (1 mg/mL) infusion, 90 mg/hr, Intravenous, Continuous, Kerney Elbe, MD, Last Rate: 90 mL/hr at 05/20/20 0700, 90 mg/hr at 05/20/20 0700 .  midazolam (VERSED) injection 1-4 mg, 1-4 mg, Intravenous, Q2H PRN, Brand Males, MD, 4 mg at 05/16/20 1011 .  midazolam (VERSED) injection 2 mg, 2 mg, Intravenous, Q15 min PRN, Brand Males, MD, 2 mg at 05/15/20  2003 .  midazolam (VERSED) injection 2 mg, 2 mg, Intravenous, Q2H PRN, Brand Males, MD .  multivitamin with minerals tablet 1 tablet, 1 tablet, Per Tube, Daily, Genevive Bi, Shona Needles, MD, 1 tablet at 05/19/20 0946 .  ondansetron (ZOFRAN) tablet 4 mg, 4 mg, Per Tube, Q6H PRN **OR** ondansetron (ZOFRAN) injection 4 mg, 4 mg, Intravenous, Q6H PRN, Chase Caller, Murali, MD .  pantoprazole sodium (PROTONIX) 40 mg/20 mL oral suspension 40 mg, 40 mg, Per Tube, QHS, Ramaswamy, Murali, MD .  PHENObarbital (LUMINAL) injection 65 mg, 65 mg, Intravenous, BID, Amie Portland, MD, 65 mg at 05/19/20 2204 .  polyethylene glycol (MIRALAX / GLYCOLAX) packet 17 g, 17 g, Per Tube, Daily, Brand Males, MD, 17 g at 05/19/20 0946 .  sodium chloride flush (NS) 0.9 % injection 10-40 mL, 10-40 mL, Intracatheter, Q12H, Brand Males, MD, 10 mL at 05/19/20 2159 .  thiamine tablet 100 mg, 100 mg, Per Tube, Daily, 100 mg at 05/19/20 0946 **OR** thiamine (B-1) injection 100 mg, 100 mg, Intravenous, Daily, Brand Males, MD Labs CBC    Component Value Date/Time   WBC 5.8 05/19/2020 2324   RBC 2.69 (L) 05/19/2020 2324   HGB 9.2 (L) 05/19/2020 2324   HCT 28.8 (L) 05/19/2020 2324   PLT 201 05/19/2020 2324   MCV 107.1 (H) 05/19/2020 2324   MCH 34.2 (H) 05/19/2020 2324   MCHC 31.9 05/19/2020 2324   RDW 11.7 05/19/2020 2324   LYMPHSABS 1.5 11/24/2019 1151   MONOABS 0.8 11/24/2019 1151   EOSABS 0.1 11/24/2019 1151   BASOSABS 0.0 11/24/2019 1151    CMP     Component Value Date/Time   NA 138 05/20/2020 0527   K 4.0 05/20/2020 0527   CL 114 (H) 05/20/2020 0527   CO2 16 (L) 05/20/2020 0527   GLUCOSE 145 (H) 05/20/2020 0527   BUN 15 05/20/2020 0527   CREATININE 0.70 05/20/2020 0527   CALCIUM 8.4 (L) 05/20/2020 0527   PROT 5.1 (L) 05/20/2020 0527   ALBUMIN 1.8 (L) 05/20/2020 0527   AST 18 05/20/2020 0527   ALT 16 05/20/2020 0527   ALKPHOS 22 (L) 05/20/2020 0527   BILITOT 0.3 05/20/2020 0527   GFRNONAA >60  05/20/2020 0527   GFRAA >60 05/20/2020 0527   ANA + (unclear significance)  Imaging I have reviewed images in epic and the results pertinent to this consultation are: MRI of the brain consistent with findings of 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 Repeat MRI 2d ago with clearing of the diffusion abnormality but persistent flair changes consistent with posterior reversible encephalopathy syndrome.  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  EEG consistent with a PLEDs plus pattern suggesting high epileptogenicity. Currently on levetiracetam, phenytoin, Onfi, Phenobarb Also on sedation with Versed and propofol. Propofol needed to be discontinued and switched to ketamine.  Once burst suppression is obtained consistently, goal would be to burst suppress for 24 to 48 hours and then  wean off sedation while controlling the blood pressures as you are and reevaluate.  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 Appreciate pharmacy assistance. Hold when the levels are in therapeutic range and resume likely at a lower dose-we will continue to follow. -Onfi -Phenobarb -Versed drip -Propofol drip ketamine drip Appreciate pharmacy assistance in management and monitoring of AEDs Goal to keep in suppression for a good 48h before weaning sedation - that would mean around 1940hr tonight - will discuss with epileptologist.  Dr. Hortense Ramal will follow tomorrow.  -- Amie Portland, MD [facility] Pager: [phone] If 7pm to 7am, please call on call as listed on AMION.    CRITICAL CARE ATTESTATION Performed by: Amie Portland, MD Total critical care time: 35  minutes Critical care time was exclusive of separately billable procedures and treating other patients and/or supervising APPs/Residents/Students Critical care was necessary to treat or prevent imminent or life-threatening deterioration due to posterior reversible encephalopathy syndrome, status epilepticus This patient is critically ill and at significant risk for neurological worsening and/or death and care requires constant monitoring. Critical care was time spent personally by me on the following activities: development of treatment plan with patient and/or surrogate as well as nursing, discussions with consultants, evaluation of patient's response to treatment, examination of patient, obtaining history from patient or surrogate, ordering and performing treatments and interventions, ordering and review of laboratory studies, ordering and review of radiographic studies, pulse oximetry, re-evaluation of patient's condition, participation in multidisciplinary rounds and medical decision making of high complexity in the care of this patient.

## 2020-05-20 NOTE — Progress Notes (Signed)
eLink Physician-Brief Progress Note Patient Name: Donna Short DOB: Dec 26, 1990 MRN: [id number]   Date of Service  05/20/2020  HPI/Events of Note  Notified of CBGs 66-114 on D10 at 75 and TF at 25  eICU Interventions  Increased CBG to q 2 and adjust fluids as needed        Darl Pikes 05/20/2020, 7:57 PM

## 2020-05-20 NOTE — Procedures (Signed)
Patient Name:Donna Short PPI:[id number] Epilepsy Attending:Cresencia Asmus [name] Referring Physician/Provider:Dr Brooke Dare Duration:05/19/2020 [phone] to 9/26/[id number]  Patient history:29 year old female with history of alcohol abuse admitted with acute pancreatitis with hospital admission complicated with significantly elevated blood pressure, withdrawal, seizure and worsening mental status. EEG to evaluate for seizure.   Level of alertness:comatose  AEDs during EEG study:LEV, PB, Ketamine, Versed  Technical aspects: This EEG study was done with scalp electrodes positioned according to the 10-20 International system of electrode placement. Electrical activity was acquired at a sampling rate of 500Hz  and reviewed with a high frequency filter of 70Hz  and a low frequency filter of 1Hz . EEG data were recorded continuously and digitally stored.   Description:No posterior dominant rhythm was seen.EEGshowed continuous generalized polymorphic 6-9hz  theta-alpha activity. Phenobarb load was given at around 1130 on 05/19/2020 after which briefly eeg showed generalized suppression which gradually improved and showed continuous generalized 6-9hz , frontally predominant theta-alpha activity.  ABNORMALITY - Continuous slow, generalized  IMPRESSION: This study issuggestive of severediffuse encephalopathylikely due to sedation. o seizure and epileptiform discharges are seen during this study.   EEG continues to be improving compared to previous day.  Jp Eastham 

## 2020-05-20 NOTE — Progress Notes (Signed)
Hypoglycemic Event  CBG: 63  Treatment: D50 50 mL (25 gm)  Symptoms: None  Follow-up CBG: Time:1156 CBG Result:114  Possible Reasons for Event: meal intake   Donna Short, [name] P

## 2020-05-20 NOTE — Progress Notes (Signed)
Mg 1.6 after 4g replacement on 9/24. Replace with 6g x1 per Dr. Marchelle Gearing.   Ulyses Southward, PharmD, BCIDP, AAHIVP, CPP Infectious Disease Pharmacist 05/20/2020 8:08 AM

## 2020-05-21 ENCOUNTER — Inpatient Hospital Stay (HOSPITAL_COMMUNITY): Payer: Managed Care, Other (non HMO)

## 2020-05-21 DIAGNOSIS — G40901 Epilepsy, unspecified, not intractable, with status epilepticus: Secondary | ICD-10-CM

## 2020-05-21 LAB — GLUCOSE, CAPILLARY
Glucose-Capillary: 103 mg/dL — ABNORMAL HIGH (ref 70–99)
Glucose-Capillary: 110 mg/dL — ABNORMAL HIGH (ref 70–99)
Glucose-Capillary: 114 mg/dL — ABNORMAL HIGH (ref 70–99)
Glucose-Capillary: 118 mg/dL — ABNORMAL HIGH (ref 70–99)
Glucose-Capillary: 120 mg/dL — ABNORMAL HIGH (ref 70–99)
Glucose-Capillary: 147 mg/dL — ABNORMAL HIGH (ref 70–99)
Glucose-Capillary: 155 mg/dL — ABNORMAL HIGH (ref 70–99)
Glucose-Capillary: 62 mg/dL — ABNORMAL LOW (ref 70–99)
Glucose-Capillary: 76 mg/dL (ref 70–99)
Glucose-Capillary: 88 mg/dL (ref 70–99)
Glucose-Capillary: 93 mg/dL (ref 70–99)

## 2020-05-21 LAB — PHOSPHORUS: Phosphorus: 4 mg/dL (ref 2.5–4.6)

## 2020-05-21 LAB — PHENOBARBITAL LEVEL: Phenobarbital: 26.6 ug/mL (ref 15.0–30.0)

## 2020-05-21 LAB — MAGNESIUM: Magnesium: 1.8 mg/dL (ref 1.7–2.4)

## 2020-05-21 LAB — COMPREHENSIVE METABOLIC PANEL
ALT: 17 U/L (ref 0–44)
AST: 30 U/L (ref 15–41)
Albumin: 2.4 g/dL — ABNORMAL LOW (ref 3.5–5.0)
Alkaline Phosphatase: 31 U/L — ABNORMAL LOW (ref 38–126)
Anion gap: 8 (ref 5–15)
BUN: 14 mg/dL (ref 6–20)
CO2: 17 mmol/L — ABNORMAL LOW (ref 22–32)
Calcium: 8.7 mg/dL — ABNORMAL LOW (ref 8.9–10.3)
Chloride: 112 mmol/L — ABNORMAL HIGH (ref 98–111)
Creatinine, Ser: 0.81 mg/dL (ref 0.44–1.00)
GFR calc Af Amer: >60 mL/min (ref >60)
GFR calc non Af Amer: >60 mL/min (ref >60)
Glucose, Bld: 126 mg/dL — ABNORMAL HIGH (ref 70–99)
Potassium: 3.8 mmol/L (ref 3.5–5.1)
Sodium: 137 mmol/L (ref 135–145)
Total Bilirubin: 0.3 mg/dL (ref 0.3–1.2)
Total Protein: 6 g/dL — ABNORMAL LOW (ref 6.5–8.1)

## 2020-05-21 LAB — CULTURE, BLOOD (ROUTINE X 2): Culture: NO GROWTH

## 2020-05-21 LAB — CBC
HCT: 28.9 % — ABNORMAL LOW (ref 36.0–46.0)
Hemoglobin: 9.3 g/dL — ABNORMAL LOW (ref 12.0–15.0)
MCH: 34.7 pg — ABNORMAL HIGH (ref 26.0–34.0)
MCHC: 32.2 g/dL (ref 30.0–36.0)
MCV: 107.8 fL — ABNORMAL HIGH (ref 80.0–100.0)
Platelets: 270 10*3/uL (ref 150–400)
RBC: 2.68 MIL/uL — ABNORMAL LOW (ref 3.87–5.11)
RDW: 11.5 % (ref 11.5–15.5)
WBC: 11.7 10*3/uL — ABNORMAL HIGH (ref 4.0–10.5)
nRBC: 0 % (ref 0.0–0.2)

## 2020-05-21 LAB — PHENYTOIN LEVEL, TOTAL: Phenytoin Lvl: 11.7 ug/mL (ref 10.0–20.0)

## 2020-05-21 MED ORDER — LISINOPRIL 10 MG PO TABS
20.0000 mg | ORAL_TABLET | Freq: Every day | ORAL | Status: DC
  Administered 2020-05-21: 20 mg via ORAL
  Filled 2020-05-21: qty 2

## 2020-05-21 MED ORDER — PHENYTOIN SODIUM 50 MG/ML IJ SOLN
75.0000 mg | Freq: Three times a day (TID) | INTRAMUSCULAR | Status: DC
  Administered 2020-05-21 – 2020-05-29 (×24): 75 mg via INTRAVENOUS
  Filled 2020-05-21 (×25): qty 1.5

## 2020-05-21 MED ORDER — LISINOPRIL 10 MG PO TABS
20.0000 mg | ORAL_TABLET | Freq: Every day | ORAL | Status: DC
  Administered 2020-05-22 – 2020-05-28 (×7): 20 mg
  Filled 2020-05-21 (×7): qty 2

## 2020-05-21 MED ORDER — MAGNESIUM SULFATE 2 GM/50ML IV SOLN
2.0000 g | Freq: Once | INTRAVENOUS | Status: AC
  Administered 2020-05-21: 2 g via INTRAVENOUS
  Filled 2020-05-21: qty 50

## 2020-05-21 MED ORDER — LABETALOL HCL 5 MG/ML IV SOLN
10.0000 mg | INTRAVENOUS | Status: DC | PRN
  Administered 2020-05-21 – 2020-05-28 (×19): 10 mg via INTRAVENOUS
  Filled 2020-05-21 (×20): qty 4

## 2020-05-21 MED ORDER — METOPROLOL TARTRATE 12.5 MG HALF TABLET
25.0000 mg | ORAL_TABLET | Freq: Two times a day (BID) | ORAL | Status: DC
  Administered 2020-05-21 (×2): 25 mg
  Filled 2020-05-21 (×2): qty 2

## 2020-05-21 MED ORDER — METOPROLOL TARTRATE 12.5 MG HALF TABLET
12.5000 mg | ORAL_TABLET | Freq: Two times a day (BID) | ORAL | Status: DC
  Administered 2020-05-21: 12.5 mg
  Filled 2020-05-21: qty 1

## 2020-05-21 MED ORDER — VITAL AF 1.2 CAL PO LIQD
1000.0000 mL | ORAL | Status: DC
  Administered 2020-05-21 – 2020-05-22 (×2): 1000 mL
  Filled 2020-05-21 (×2): qty 1000

## 2020-05-21 MED ORDER — METOPROLOL TARTRATE 12.5 MG HALF TABLET
12.5000 mg | ORAL_TABLET | Freq: Once | ORAL | Status: AC
  Administered 2020-05-21: 12.5 mg
  Filled 2020-05-21: qty 1

## 2020-05-21 MED ORDER — METOPROLOL TARTRATE 12.5 MG HALF TABLET: 12.5000 mg | ORAL_TABLET | Freq: Once | ORAL | Status: DC

## 2020-05-21 NOTE — Procedures (Signed)
Patient Name:Donna Short YCX:[id number] Epilepsy Attending:Kamaal Cast [name] Referring Physician/Provider:Dr Brooke Dare Duration:05/20/2020 [phone] to 9/27/[id number]  Patient history:29 year old female with history of alcohol abuse admitted with acute pancreatitis with hospital admission complicated with significantly elevated blood pressure, withdrawal, seizure and worsening mental status. EEG to evaluate for seizure.  Level of alertness:comatose  AEDs during EEG study:LEV, PB, Ketamine, Versed  Technical aspects: This EEG study was done with scalp electrodes positioned according to the 10-20 International system of electrode placement. Electrical activity was acquired at a sampling rate of 500Hz  and reviewed with a high frequency filter of 70Hz  and a low frequency filter of 1Hz . EEG data were recorded continuously and digitally stored.   Description:No posterior dominant rhythm was seen.EEGshowed continuous generalized 6-9hz , frontally predominant theta-alpha activity.   ABNORMALITY - Continuous slow, generalized  IMPRESSION: This study issuggestive of severediffuse encephalopathylikely due to sedation. o seizure and epileptiform discharges are seen during this study.  EEG appears similar to previous day.  Christ Fullenwider 

## 2020-05-21 NOTE — Progress Notes (Signed)
Continued increasing heart rate and blood pressure, new order to administer Metoprolol 12.5mg  per tube.

## 2020-05-21 NOTE — Progress Notes (Signed)
New order to administer Albumin 25 g x 1 dose per Elink.

## 2020-05-21 NOTE — Progress Notes (Signed)
Discussed with CCM MD regarding patient's increasing heart rate and sustained elevated BP. Decision was made to keep the Versed drip on during the night at a rate of 5. Will continue to give PRNs and await further orders.

## 2020-05-21 NOTE — Progress Notes (Signed)
NAME:  Donna Short, MRN:  [id number], DOB:  1991/04/27, LOS: 10 ADMISSION DATE:  05/10/2020, CONSULTATION DATE:  05/14/20 REFERRING MD:  Loney Loh, CHIEF COMPLAINT:  Encephalopathy   Brief History   Donna Short is a 29 y.o. female with a PMHx of EtOH abuse and HTN admitted to [facility] 9/17 for acute on chronic pancreatitis and EtOH withdrawal. During her hospital stay she developed uncontrolled HTN and breakthrough seizure activity with MRI findings consistent with PRES.   Past Medical History   has a past medical history of Asthma, Depression, History of chicken pox, Hypertension, Migraines, Pancreatitis (01/2018), and Seizures (HCC). Allergies  Allergen Reactions  . Claritin [Loratadine] Itching  . Peanut-Containing Drug Products Other (See Comments)  . Benadryl [Diphenhydramine Hcl (Sleep)] Rash  . Latex Rash  . Shrimp [Shellfish Allergy] Hives and Rash     Significant Hospital Events   9/16 - admit 9/18, 9/19 breakthrough seizures 9/20- MRI showing PRES 9/21 -Patient had a hypoglycemic episode with BG 61 and shaking overnight consistent with previous episodes past 2 days. Symptoms responded well to 8 oz of juice and BG improved to 72 within 30 minutes. Likely due to poor oral intake intubation for burst suppression  9/22  - 9/22 - intubated yesterday for burst suppressio needs -> post intubation hypothermic/then febrile 102F with hypotension/pressor need. Off cleviprex -> this morning off pressors.  Continued hypoglycemia -> needing more D5. Not yet on TF. Making urine. Neuro Dr Wilford Corner concerned about recurrent PRES (milder in Jan 2021). Wants to consider LP if fever persists and culture negative. Currently on diprivan gtt and MAP at goal - 80-90  9/23 -  9/23  EEG with continued PLED per neuro. Versed increaed from 30mg /h to 40mg /h. MAx diprivan. On keppra On neop. On d5 normal saline. On TF. 40% fio2 on vent. ECHO normal. No DVT on duplex. Afebrile. MAP at 90 at goal. No labs in  results today. AUtoimmune sent yestserday. UDS Sent yesterday. Off cleviprex  9/24 - on 40% FiO2.  On ventilator.  On low-dose Neo-Synephrine to keep the MAP between 80 and 90.  Not needing Cleviprex.  Has increased Versed needs.  Currently on Versed 90 mg/h and propofol 90 mcg.  Autoimmune profile negative except for weak positive ANA.  Has hypoglycemia despite tube feeds.  Started on D10 -infusion  9/25 - remains on 40% oxygen and PEEP of 5.  On the ventilator.  RASS sedation score -5.  Propofol has been stopped because of increased triglycerides.  Continues on Versed infusion ketamine has been started.  Neuro electrophysiologist -EEG showed improved PLEDs and now replaced by intermittent spikes.  Ongoing encephalopathy present.  Overall EEG significantly improved.  Also on phenobarb and Keppra.  Dilantin still on hold. Currently off neo because mean arterial pressure is automatically between 80 and 90  Consults:  Neurology- consulted 9/20, recommended maintaining strict BP control with MAP goals 80-90, Keppra 1g 2x daily for seizure control  Procedures:  ETT 9/21 PICC 9/22   Significant Diagnostic Tests:  9/16 = Abdominal CT- findings consistent with acute on chronic pancreatitis predominantly involving the proximal pancreas. No peripancreatic fluid collections are identified. EEG- 9/20, evidence of epileptogenicity arising from left posterior temporal region as well as cortical dysfunction in left hemisphere due to underlying structural abnormality, PRES MRI- 9/20, patchy/extensive brain edema in a pattern consistent with PRES 9/22 - echo and duplex - normal 9/26 - CXR stable with lower lung infiltrates L>R  Micro Data:  SARS  9/16 - neg Trach 9/23 - nl flora Urine 9/22 - neg Blood 9/22 - neg so far MRSa PCR 9/20 -neg Autoimmune and vasculitis panel 9/22 >> ANA weak positive but  Otherwise neg U Tox 9/22 - positive benzo (Versed infusion)  Antimicrobials:  Aztreoniam 9/22 (empiric  sepsis)>> 9/22, Zosyn 9/22 >> 05/19/2020  Interim history/subjective:   05/21/2020 -Tachycardic and hypertensive overnight, started on home dose Metoprolol 25mg  BID. Eeg stable from previous day.  On ventilator 40% FiO2, PEEP 5.  On tube feeds.  Sedated with Versed and ketamine infusions and antiepileptics.  Neurology recs weaning off sedation.  Objective   Blood pressure (!) 132/100, pulse (!) 114, temperature 98.5 F (36.9 C), temperature source Oral, resp. rate 20, height 5\' 2"  (1.575 m), weight 64.4 kg, SpO2 100 %.    Vent Mode: PRVC FiO2 (%):  [40 %] 40 % Set Rate:  [18 bmp] 18 bmp Vt Set:  [420 mL] 420 mL PEEP:  [5 cmH20] 5 cmH20 Plateau Pressure:  [18 cmH20-22 cmH20] 18 cmH20   Intake/Output Summary (Last 24 hours) at 05/21/2020 1029 Last data filed at 05/21/2020 1000 Gross per 24 hour  Intake 6556.23 ml  Output 7325 ml  Net -768.77 ml   Filed Weights   05/19/20 0424 05/20/20 0500 05/21/20 0500  Weight: 64.4 kg 63.1 kg 64.4 kg   Gen: Critically ill looking young lady.  Unarousable on the ventilator.  RASS sedation score -5.  Synchronous with the ventilator at 40%.    HEENT: Pupils equal, sluggishly reactive to light CV: Normal rhythm, S1, S2, Tachycardic, 2+ pulses Radial and DP, Cap refill <2 secs, BLE edema minimal, BUE 1+ edema in hands Pulm: Mechanical breath sounds, lungs clear at bases  Abdomen: soft, slightly distended. Tolerating tube feeds. 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 Worse compared to Jan 2021.  Diprivan stopped on May 18, 2020 secondary to high triglycerides  05/21/2020 - RASS -5.  On high-dose Versed infusion and also ketamine infusion.  On Keppra and phenobarb EEG with improvement.  P:   Per  neuro EEG  Versed gtt and ketamine gtt  for sedation and PLED conrol - Decrease Ketamine from 2.4 to 1.8 mg/kg/hr today for weaning sedation Keppra and phenobarb per neuro Neuro holding dilantin  A:  Acute Resp Failure - s/p intubation for burst suppression 05/14/20   05/21/2020 - > does not meet criteria for SBT/Extubation in setting of Acute Respiratory Failure due to induced coma  P:   Full mechanical ventilator support VAP protocool   A:   Hypertensive all admission needing cleviprex Hypotensive post intubatioin 9/21 and ?  Sepsis - needing transient pressors  -Mean arterial pressure at goal between 80 and 90.  Not needing Neo-Synephrine.  Not needing Cleviprex  P:  MAP goal 80-90 - Restart home Metoprolol 25 mg BID and Lisinopril 20 mg daily  A: Tachycardia - Elevated starting 9/26, not responsive to Albumin, likely due to Ketamine infusion, patient is afebrile without signs of sepsis and negative cultures to date  P: Restart home Metoprolol and Lisinopril as above, decreasing Ketamine for sedation wean today   A:   Hypothermic and then febrile 9/21-9/22 with lactic acidosis 3 MRSA PCR negative.  Urine  strep negative Lactic acodiss + -resolved 05/17/2020 Off zosyn 9/25  05/21/2020 - afebrile. PCT Normal.  Lactic acid normal.  So far culture negative,.  P:   MR or LP based on course (per neuro)   A:  AKI resolved 9/17  P:  monitor   A:  Hypomagnesemia  P: Replete magnesium   A:   Pancreatitis (acute on chronic ) - etoh 05/10/2020 - admit based on CT with lipase 101 at admit  05/21/2020 - tolerating TF  P:   TF   A:  anemia critical illness  05/21/2020 - no active bleeding  P:  - PRBC for hgb </= 6.9gm%   - exceptions are  -  if ACS susepcted/confirmed then transfuse for hgb </= 8.0gm%,  or   -  active bleeding with hemodynamic instability, then transfuse regardless of  hemoglobin value  -  At at all times try to transfuse 1 unit prbc as  possible with exception of  active hemorrhage   HEMATOLOGIC - Platelets A Transient Thrombocytopenia [Baseline platelets 150-200; thrombocytopenia 80s in [facility]Arch 2021) - 9/20 - 9/24 and resolved.  Duplex negative.  HIT antibody panel - 05/16/2020  P Continue lovenox  ENDOCRINE A:   Hypoglycemia  -Ongoing despite tube feeds on 05/18/2020.  Improved 05/19/2020 after adding D10 - 05/21/20 AM BG WNL  P:   Continue TF - consult nutrition for goal feed, increase from 25 ml per hour? Decrease D10 to 75 ml/hr - Goal to d/c and increase TF SSI  MSK/DERM AT risk for Sacral deub-909/25/21 per nursing At risk for rhabdo due to diprivan -high triglycerides and to prevent stopped 05/18/2020.   05/21/2020  - CK and lacate normal Plan  - nursing care - monitor CK and lactate as prn  Best practice:  Diet: start TF Pain/Anxiety/Delirium protocol (if indicated): diprivan gtt + versed gtt VAP protocol (if indicated): N/a DVT prophylaxis: Lovenox GI prophylaxis: N/a Glucose control: ssi Mobility: bedrest Code Status: Full code Family Communication:  FMLA form done 9/21. Mom Meryl Dare [phone]) updated 9/21 at bedside and over phone 05/16/20 and again 9/23 over phone and 05/19/20. Mom was updated by nursing 05/20/20  Disposition: Continued ICU care     ATTESTATION & SIGNATURE  Olene Craven, MS4   LABS    PULMONARY Recent Labs  Lab 05/15/20 2205  PHART 7.533*  PCO2ART 34.6  PO2ART 589*  HCO3 29.7*  TCO2 31  O2SAT 100.0    CBC Recent Labs  Lab 05/18/20 0540 05/19/20 2324 05/21/20 0035  HGB 8.6* 9.2* 9.3*  HCT 26.4* 28.8* 28.9*  WBC 7.8 5.8 11.7*  PLT 155 201 270    COAGULATION No results for input(s): INR in the last 168 hours.  CARDIAC  No results for input(s): TROPONINI in the last 168 hours. No results for input(s): PROBNP in the last 168 hours.   CHEMISTRY Recent Labs  Lab 05/16/20 0021 05/16/20 0021 05/17/20 1004 05/17/20 1004 05/18/20 0540  05/18/20 0540 05/19/20 0421 05/20/20 0527 05/21/20 0522  NA 133*  --  137  --  139  --   --  138 137  K 3.2*   < > 4.3   < > 4.0   < >  --  4.0 3.8  CL 101  --  112*  --  113*  --   --  114* 112*  CO2 20*  --  18*  --  17*  --   --  16* 17*  GLUCOSE 182*  --  167*  --  97  --   --  145* 126*  BUN 9  --  9  --  10  --   --  15 14  CREATININE 0.80  --  0.66  --  0.68  --   --  0.70 0.81  CALCIUM 9.2  --  7.5*  --  7.3*  --   --  8.4* 8.7*  MG 1.5*  --  1.8  --  1.5*  --   --  1.6* 1.8  PHOS 4.1   < > 3.5  --  3.8  --  5.2* 5.3* 4.0   < > = values in this interval not displayed.   Estimated Creatinine Clearance: 88.6 mL/min (by C-G formula based on SCr of 0.81 mg/dL).   LIVER Recent Labs  Lab 05/16/20 0021 05/17/20 1004 05/18/20 0540 05/20/20 0527 05/21/20 0522  AST 49* ALT 32 ALKPHOS 30* 22* 20* 22* 31*  BILITOT 0.9 0.5 0.6 0.3 0.3  PROT 6.5 5.1* 4.8* 5.1* 6.0*  ALBUMIN 3.2* 2.1* 1.8* 1.8* 2.4*     INFECTIOUS Recent Labs  Lab 05/16/20 0946 05/16/20 0946 05/17/20 1004 05/18/20 0540 05/20/20 0527  LATICACIDVEN 3.6*   < > 1.5 1.0 0.8  PROCALCITON <0.10  --   --   --   --    < > = values in this interval not displayed.     ENDOCRINE CBG (last 3)  Recent Labs    05/21/20 0528 05/21/20 0529 05/21/20 0719  GLUCAP 88 118* 103*         IMAGING x48h  - image(s) personally visualized  -   highlighted in bold DG CHEST PORT 1 VIEW  Result Date: 05/21/2020 CLINICAL DATA:  Endotracheal tube follow-up EXAM: PORTABLE CHEST 1 VIEW COMPARISON:  05/20/2020 FINDINGS: Mild cardiac enlargement. Endotracheal tube, enteric tube, and right PICC line are unchanged in position. There is evidence of infiltration in the lung bases, greater on the left. Similar appearance to previous study. No pleural effusions. No pneumothorax. Mediastinal contours appear intact. IMPRESSION: Persistent infiltration in the lung bases, greater on the left. Appliances are  unchanged in position. Electronically Signed   By: Burman Nieves M.D.   On: 05/21/2020 05:06   DG CHEST PORT 1 VIEW  Result Date: 05/20/2020 CLINICAL DATA:  Respiratory failure. EXAM: PORTABLE CHEST 1 VIEW COMPARISON:  05/18/2020 FINDINGS: An endotracheal tube with tip 1.3 cm above the carina, NG tube entering the stomach and RIGHT PICC line with tip overlying the LOWER SVC again noted. LEFT LOWER lung consolidation/atelectasis again noted. There is no evidence of pneumothorax. No significant change noted. IMPRESSION: Unchanged appearance of the chest with LEFT LOWER lung consolidation/atelectasis. Electronically Signed   By: Harmon Pier M.D.   On: 05/20/2020 07:24   Critical care attending attestation note:  Patient seen and examined and relevant ancillary tests reviewed.  I agree with the assessment and plan of care as outlined by Olene Craven, MS 4.   Synopsis of assessment and plan:  29 year old woman with a history of alcohol abuse and chronic pancreatitis.  She remains critically ill due to status epilepticus requiring titration of sedative infusions and acute hypoxic hypercapnic respiratory failure secondary to inability to protect airway requiring mechanical ventilation.  Seizures have developed in the context of PRES.  On examination the patient is sedated intubated and unresponsive to painful stimuli.  Pupils are reactive.  Chest is clear to auscultation bilaterally.  There is no ventilator dyssynchrony.  Heart sounds are unremarkable.  Extremities are warm and well-perfused.  Abdomen is soft and nontender.  She is tolerating tube feeds.  There is diffuse generalized edema.  Continuous EEG personally reviewed and shows a burst suppression pattern.  Assessment:  Progressive wean of sedative infusions starting with ketamine.  Follow continuous EEG for recurrence of seizure. Continue current anticonvulsant regimen. Treat occasional ventilator dyssynchrony with fentanyl as  needed.  Avoid additional sedative infusions if possible Limit fluid intake and potentially diuresis. Resume home antihypertensive regimen. Increase tube feed to prevent hypoglycemia.  CRITICAL CARE Performed by: Lynnell Catalanavi Benedicto Capozzi   Total critical care time: 45 minutes  Critical care time was exclusive of separately billable procedures and treating other patients.  Critical care was necessary to treat or prevent imminent or life-threatening deterioration.  Critical care was time spent personally by me on the following activities: development of treatment plan with patient and/or surrogate as well as nursing, discussions with consultants, evaluation of patient's response to treatment, examination of patient, obtaining history from patient or surrogate, ordering and performing treatments and interventions, ordering and review of laboratory studies, ordering and review of radiographic studies, pulse oximetry, re-evaluation of patient's condition and participation in multidisciplinary rounds.  Lynnell Catalanavi Maxxwell Edgett, MD [facility] ICU Physician [facility]  Pager: [phone] Mobile: [phone] After hours: [phone].  05/21/2020, 3:07 PM

## 2020-05-21 NOTE — Progress Notes (Signed)
eLink Physician-Brief Progress Note Patient Name: Donna Short DOB: 1991-02-21 MRN: [id number]   Date of Service  05/21/2020  HPI/Events of Note  HR remains elevated despite albumin BP 137/102  HR 129 Patient takes metoprolol 25 BID twice a day at home  eICU Interventions  Will start patient with metoprolol 12.5 BID     Intervention Category Major Interventions: Arrhythmia - evaluation and management  Darl Pikes 05/21/2020, 1:33 AM

## 2020-05-21 NOTE — Progress Notes (Signed)
Writer notified Elink regarding increasing heart rate and blood pressure.

## 2020-05-21 NOTE — Progress Notes (Signed)
LTM maint complete - no skin breakdown under:   Cz, p4, fp2, f8, f7   Pt had fp1 skin breakdown that was bleeding

## 2020-05-21 NOTE — Progress Notes (Signed)
Nutrition Follow-up   DOCUMENTATION CODES:   Not applicable  INTERVENTION:   Continue tube feeding via OG tube: Change to Vital AF 1.2 at 50 ml/h (1200 ml per day) D/C Prosource TF   Provides 1440 kcal, 90 gm protein, 973 ml free water daily to better meet re-estimated nutrition needs.   NUTRITION DIAGNOSIS:   Inadequate oral intake related to inability to eat as evidenced by NPO status.  Ongoing   GOAL:   Patient will meet greater than or equal to 90% of their needs   Met with TF  MONITOR:   Vent status, TF tolerance, Labs  REASON FOR ASSESSMENT:   Ventilator, Consult Enteral/tube feeding initiation and management  ASSESSMENT:    29 yo female admitted with acute on chronic pancreatitis and alcohol withdrawal. PMH includes alcohol abuse, HTN.   Propofol has been discontinued due to elevated triglycerides. Asked by RN to adjust TF orders now that patient is not receiving kcal from propofol.  Currently receiving Vital High Protein via OGT at 25 ml/h (1080 ml/day) with Prosource TF 45 ml QID providing 760 kcal, 97 gm protein, 502 ml free water daily.  Patient is currently intubated on ventilator support MV: 8.1 L/min Temp (24hrs), Avg:98.8 F (37.1 C), Min:98.1 F (36.7 C), Max:99.6 F (37.6 C)  Labs reviewed.  CBG: [phone]  Medications reviewed and include colace, folic acid, MVI with minerals, phenobarbital, miralax, thiamine, ketamine, Keppra, Versed.  Weight trending up, 64.4 kg today, 58.1 kg on admission I/O +24 L since admission  Diet Order:   Diet Order            Diet NPO time specified  Diet effective now                 EDUCATION NEEDS:   Not appropriate for education at this time  Skin:  Skin Assessment: Reviewed RN Assessment  Last BM:  9/26  Height:   Ht Readings from Last 1 Encounters:  05/10/20 _0  (1.575 m)    Weight:   Wt Readings from Last 1 Encounters:  05/21/20 64.4 kg    Ideal Body Weight:  50 kg  BMI:   Body mass index is 25.97 kg/m.  Estimated Nutritional Needs:   Kcal:  1520  Protein:  85-100 gm  Fluid:  >/= 1.8 L    Lucas Mallow, RD, LDN, CNSC Please refer to Amion for contact information.

## 2020-05-21 NOTE — Progress Notes (Signed)
eLink Physician-Brief Progress Note Patient Name: Donna Short DOB: 06/01/1991 MRN: [id number]   Date of Service  05/21/2020  HPI/Events of Note  Notified of BP now at 167/125  HR 120s. Metoprolol 12.5 given  eICU Interventions  Will give additional 12.5 of metoprolol and increase to 25 BID     Intervention Category Major Interventions: Hypertension - evaluation and management  Rosalie Gums Chandler Stofer 05/21/2020, 3:52 AM

## 2020-05-21 NOTE — Progress Notes (Signed)
Writer notified on call Dr.Lindzen regarding heart rate 130's. No new orders at this time.

## 2020-05-21 NOTE — Progress Notes (Signed)
Patient was rehooked with MRI compatible electrodes. Noted to have some skin breakdown at f7 so electrode shifted .3cm toward anterior.

## 2020-05-21 NOTE — Progress Notes (Signed)
Elink made aware of heart rate and blood pressure, no new orders.

## 2020-05-21 NOTE — Progress Notes (Signed)
Subjective: No clinical seizures overnight.  No family at bedside.  ROS: Unable to obtain due to poor mental status  Examination  Vital signs in last 24 hours: Temp:  [98.1 F (36.7 C)-99.6 F (37.6 C)] 98.4 F (36.9 C) (09/27 1124) Pulse Rate:  [101-138] 112 (09/27 1105) Resp:  [13-26] 26 (09/27 1105) BP: (100-182)/(70-135) 119/90 (09/27 1105) SpO2:  [91 %-100 %] 95 % (09/27 1105) FiO2 (%):  [40 %] 40 % (09/27 1105) Weight:  [64.4 kg] 64.4 kg (09/27 0500)  General: lying in bed, not in apparent distress CVS: pulse-normal rate and rhythm RS: breathing comfortably, intubated Extremities: normal, warm  Neuro: On Versed at 70, ketamine at 1.3, comatose, does not open eyes to noxious stimuli, pupils equally round and reactive, corneal reflex present, gag reflex present, withdraws to noxious stimuli with antigravity strength in right upper extremity, 2/5 in left upper extremity and 1/5 in bilateral lower extremities.  Basic Metabolic Panel: Recent Labs  Lab 05/16/20 0021 05/16/20 0021 05/17/20 1004 05/17/20 1004 05/18/20 0540 05/19/20 0421 05/20/20 0527 05/21/20 0522  NA 133*  --  137  --  139  --  138 137  K 3.2*  --  4.3  --  4.0  --  4.0 3.8  CL 101  --  112*  --  113*  --  114* 112*  CO2 20*  --  18*  --  17*  --  16* 17*  GLUCOSE 182*  --  167*  --  97  --  145* 126*  BUN 9  --  9  --  10  --  15 14  CREATININE 0.80  --  0.66  --  0.68  --  0.70 0.81  CALCIUM 9.2   < > 7.5*   < > 7.3*  --  8.4* 8.7*  MG 1.5*  --  1.8  --  1.5*  --  1.6* 1.8  PHOS 4.1   < > 3.5  --  3.8 5.2* 5.3* 4.0   < > = values in this interval not displayed.    CBC: Recent Labs  Lab 05/16/20 0021 05/17/20 1004 05/18/20 0540 05/19/20 2324 05/21/20 0035  WBC 8.1 9.2 7.8 5.8 11.7*  HGB 13.1 9.3* 8.6* 9.2* 9.3*  HCT 37.7 28.0* 26.4* 28.8* 28.9*  MCV 99.0 105.7* 107.8* 107.1* 107.8*  PLT 93* 130* 155 201 270     Coagulation Studies: No results for input(s): LABPROT, INR in the last 72  hours.  Imaging  MRI brain with and without contrast 05/18/2020: Persistent findings consistent with press, overall mildly improved.  No new acute intracranial abnormality.   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  Recommendations -Start weaning ketamine at 0.5 mg/kg/hr and Versed at 10 mL/h to stop -Corrected Dilantin level today 15.4, will resume Dilantin at 75 mg every 8 hours -Continue Keppra 1500 mg twice daily, phenobarb 65 mg twice daily.  -We will order phenobarb level -If patient has any breakthrough seizures or EEG starts worsening and if phenobarb is subtherapeutic, can increase phenobarb. -If phenobarb is therapeutic, will add another antiepileptic like topiramate. -Continue seizure precautions For management of rest of comorbidities per primary team  CRITICAL CARE  Performed by: [name]   Total critical care time:  Critical care time was exclusive of separately billable procedures and treating other patients.  Critical care was necessary to treat or prevent imminent or life-threatening deterioration.  Critical care was time spent personally by me on the following activities: development of treatment plan with patient and/or surrogate as well as nursing, discussions with consultants, evaluation of patient's response to treatment, examination of patient, obtaining history from patient or surrogate, ordering and performing treatments and interventions, ordering and review of laboratory studies, ordering and review of  radiographic studies, pulse oximetry and re-evaluation of patient's condition.   Lindie Spruce Epilepsy [facility] For questions after 5pm please refer to AMION to reach the Neurologist on call

## 2020-05-22 LAB — GLUCOSE, CAPILLARY
Glucose-Capillary: 33 mg/dL — CL (ref 70–99)
Glucose-Capillary: 137 mg/dL — ABNORMAL HIGH (ref 70–99)
Glucose-Capillary: 164 mg/dL — ABNORMAL HIGH (ref 70–99)
Glucose-Capillary: 143 mg/dL — ABNORMAL HIGH (ref 70–99)
Glucose-Capillary: 119 mg/dL — ABNORMAL HIGH (ref 70–99)
Glucose-Capillary: 129 mg/dL — ABNORMAL HIGH (ref 70–99)
Glucose-Capillary: 107 mg/dL — ABNORMAL HIGH (ref 70–99)

## 2020-05-22 LAB — PHOSPHORUS: Phosphorus: 3.3 mg/dL (ref 2.5–4.6)

## 2020-05-22 MED ORDER — AMLODIPINE BESYLATE 10 MG PO TABS
10.0000 mg | ORAL_TABLET | Freq: Every day | ORAL | Status: DC
  Administered 2020-05-22: 10 mg
  Filled 2020-05-22: qty 1

## 2020-05-22 MED ORDER — FENTANYL CITRATE (PF) 100 MCG/2ML IJ SOLN
50.0000 ug | INTRAMUSCULAR | Status: AC | PRN
  Administered 2020-05-22 (×3): 50 ug via INTRAVENOUS
  Filled 2020-05-22 (×3): qty 2

## 2020-05-22 MED ORDER — HYDROMORPHONE HCL 1 MG/ML IJ SOLN
1.0000 mg | INTRAMUSCULAR | Status: DC | PRN
  Administered 2020-05-23 – 2020-05-28 (×17): 1 mg via INTRAVENOUS
  Filled 2020-05-22 (×18): qty 1

## 2020-05-22 MED ORDER — FENTANYL CITRATE (PF) 100 MCG/2ML IJ SOLN
INTRAMUSCULAR | Status: AC
  Filled 2020-05-22: qty 2

## 2020-05-22 MED ORDER — METOPROLOL TARTRATE 100 MG PO TABS
100.0000 mg | ORAL_TABLET | Freq: Two times a day (BID) | ORAL | Status: DC
  Administered 2020-05-22 – 2020-05-31 (×18): 100 mg
  Filled 2020-05-22 (×10): qty 1
  Filled 2020-05-22: qty 8
  Filled 2020-05-22 (×10): qty 1

## 2020-05-22 NOTE — Progress Notes (Signed)
LTM maintenance completed; reprepped and moved Fp1 and Fp2, reprepped P4, C4, Cz, C3, and ref. No new skin breakdown was seen.

## 2020-05-22 NOTE — Progress Notes (Signed)
NAME:  Donna Short, MRN:  [id number], DOB:  05-06-1991, LOS: 11 ADMISSION DATE:  05/10/2020, CONSULTATION DATE:  05/14/20 REFERRING MD:  Loney Lohathore, CHIEF COMPLAINT:  Encephalopathy   Brief History   Donna Short is a 29 y.o. female with a PMHx of EtOH abuse and HTN admitted [facility] 9/17 for acute on chronic pancreatitis and EtOH withdrawal. During her hospital stay she developed uncontrolled HTN and breakthrough seizure activity with MRI findings consistent with PRES.   Past Medical History   has a past medical history of Asthma, Depression, History of chicken pox, Hypertension, Migraines, Pancreatitis (01/2018), and Seizures (HCC). Allergies  Allergen Reactions  . Claritin [Loratadine] Itching  . Peanut-Containing Drug Products Other (See Comments)  . Benadryl [Diphenhydramine Hcl (Sleep)] Rash  . Latex Rash  . Shrimp [Shellfish Allergy] Hives and Rash     Significant Hospital Events   9/16 - admit 9/18, 9/19 breakthrough seizures 9/20- MRI showing PRES 9/21 -Patient had a hypoglycemic episode with BG 61 and shaking overnight consistent with previous episodes past 2 days. Symptoms responded well to 8 oz of juice and BG improved to 72 within 30 minutes. Likely due to poor oral intake intubation for burst suppression  9/22  - 9/22 - intubated yesterday for burst suppressio needs -> post intubation hypothermic/then febrile 102F with hypotension/pressor need. Off cleviprex -> this morning off pressors.  Continued hypoglycemia -> needing more D5. Not yet on TF. Making urine. Neuro Dr Wilford CornerArora concerned about recurrent PRES (milder in Jan 2021). Wants to consider LP if fever persists and culture negative. Currently on diprivan gtt and MAP at goal - 80-90  9/23 -  9/23  EEG with continued PLED per neuro. Versed increaed from 30mg /h to 40mg /h. MAx diprivan. On keppra On neop. On d5 normal saline. On TF. 40% fio2 on vent. ECHO normal. No DVT on duplex. Afebrile. MAP at 90 at goal. No labs in  results today. AUtoimmune sent yestserday. UDS Sent yesterday. Off cleviprex  9/24 - on 40% FiO2.  On ventilator.  On low-dose Neo-Synephrine to keep the MAP between 80 and 90.  Not needing Cleviprex.  Has increased Versed needs.  Currently on Versed 90 mg/h and propofol 90 mcg.  Autoimmune profile negative except for weak positive ANA.  Has hypoglycemia despite tube feeds.  Started on D10 -infusion  9/25 - remains on 40% oxygen and PEEP of 5.  On the ventilator.  RASS sedation score -5.  Propofol has been stopped because of increased triglycerides.  Continues on Versed infusion ketamine has been started.  Neuro electrophysiologist -EEG showed improved PLEDs and now replaced by intermittent spikes.  Ongoing encephalopathy present.  Overall EEG significantly improved.  Also on phenobarb and Keppra.  Dilantin still on hold. Currently off neo because mean arterial pressure is automatically between 80 and 90  9/27- Ketamine discontinued and Versed down to 5mg /hr  Consults:  Neurology- consulted 9/20, recommended maintaining strict BP control with MAP goals 80-90, Continue Keppra 1500 mg twice daily, Dilantin 75 mg every 8 hours, phenobarb 65 mg twice daily (phenobarb level 26.6 on 05/21/2020)  Procedures:  ETT 9/21 PICC 9/22   Significant Diagnostic Tests:  9/16 = Abdominal CT- findings consistent with acute on chronic pancreatitis predominantly involving the proximal pancreas. No peripancreatic fluid collections are identified. EEG- 9/20, evidence of epileptogenicity arising from left posterior temporal region as well as cortical dysfunction in left hemisphere due to underlying structural abnormality, PRES MRI- 9/20, patchy/extensive brain edema in a pattern  consistent with PRES 9/22 - echo and duplex - normal 9/26 - CXR stable with lower lung infiltrates L>R  Micro Data:  SARS 9/16 - neg Trach 9/23 - nl flora Urine 9/22 - neg Blood 9/22 - neg so far MRSa PCR 9/20 -neg Autoimmune and  vasculitis panel 9/22 >> ANA weak positive but  Otherwise neg U Tox 9/22 - positive benzo (Versed infusion)  Antimicrobials:  Aztreoniam 9/22 (empiric sepsis)>> 9/22, Zosyn 9/22 >> 05/19/2020  Interim history/subjective:   05/22/2020 - ContinuallyTachycardic and hypertensive overnight, Eeg stable from previous day.  On ventilator SBT.  On tube feeds increased to 32ml/hr.  Sedated with Versed 5mg /hr. She is moving and opening eyes intermittently but not following commands.  Objective   Blood pressure (!) 163/121, pulse (!) 119, temperature 100.2 F (37.9 C), temperature source Oral, resp. rate (!) 29, height 5\' 2"  (1.575 m), weight 64.2 kg, SpO2 100 %.    Vent Mode: PSV;CPAP FiO2 (%):  [40 %] 40 % Set Rate:  [18 bmp] 18 bmp Vt Set:  [400 mL-420 mL] 400 mL PEEP:  [5 cmH20] 5 cmH20 Pressure Support:  [10 cmH20] 10 cmH20 Plateau Pressure:  [17 cmH20-19 cmH20] 18 cmH20   Intake/Output Summary (Last 24 hours) at 05/22/2020 1107 Last data filed at 05/22/2020 1031 Gross per 24 hour  Intake 3300.78 ml  Output 4275 ml  Net -974.22 ml   Filed Weights   05/20/20 0500 05/21/20 0500 05/22/20 0439  Weight: 63.1 kg 64.4 kg 64.2 kg   Gen: Critically ill looking young lady.  Mildly arousable on the ventilator. HEENT: Pupils equal, sluggishly reactive to light CV: Normal rhythm, S1, S2, Tachycardic, 2+ pulses Radial and DP, Cap refill <2 secs, BLE edema minimal, BUE 1+ edema in hands Pulm: Normal breath sounds, lungs clear at apex/bases  Abdomen: soft, slightly distended. Tolerating tube feeds. 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 Worse compared to Jan 2021.  Diprivan stopped on May 18, 2020 secondary to high triglycerides  P:   Per  neuro EEG  D/c Versed today Continue Keppra 1500 mg twice daily, Dilantin 75 mg every 8 hours, phenobarb 65 mg twice daily (phenobarb level 26.6 on 05/21/2020) HTN control  A:  Acute Resp Failure - s/p intubation for burst suppression 05/14/20   05/22/2020 - > Tolerating SBT  P:   SBT with sedation wean to prepare for extubation VAP protocool   A:   Hypertensive all admission needing cleviprex Hypotensive post intubatioin 9/21 and ?  Sepsis - needing transient pressors  -Mean arterial pressure above goal of 80 and 90,  MAP around 120-130 overnight  P:  MAP goal 80-90 - Continue home Lisinopril 20 mg daily - Increased metoprolol to 100 mg BID - Start Amlodipine 10 mg daily  A: Tachycardia - Elevated starting 9/26, not responsive to Albumin, likely due to Ketamine infusion, patient is afebrile without signs of sepsis and negative cultures to date  P: Continue Metoprolol and Lisinopril as above  A:   Hypothermic and then febrile 9/21-9/22 with lactic acidosis 3 MRSA PCR negative.  Urine strep negative Lactic acodiss + -resolved 05/17/2020 Off zosyn 9/25  05/22/2020 - afebrile. PCT Normal.  Lactic acid normal.  So far culture negative,.  P:   MR or LP based on course (per neuro)  A:   Pancreatitis (acute on chronic ) - etoh 05/10/2020 - admit based on CT with lipase 101 at admit  05/21/2020 - tolerating TF  P:   TF   A:  anemia critical illness  05/22/2020 - no active bleeding  P:  - PRBC for hgb </= 6.9gm%   - exceptions are  -  if ACS susepcted/confirmed then transfuse for hgb </= 8.0gm%,  or   -  active bleeding with hemodynamic instability, then transfuse regardless of  hemoglobin value  -  At at all times try to transfuse 1 unit prbc as possible with exception of  active hemorrhage   HEMATOLOGIC - Platelets A Transient Thrombocytopenia [Baseline platelets 150-200; thrombocytopenia 80s in [facility] 2021) - 9/20 - 9/24 and resolved.  Duplex negative.  HIT  antibody panel normal - 05/16/2020  P Continue lovenox  ENDOCRINE A:   Hypoglycemia  -Ongoing despite tube feeds on 05/18/2020.  Improved 05/19/2020 after adding D10 - 05/22/20 AM BG WNL  P:   Continue TF  D/c D10 SSI  MSK/DERM AT risk for Sacral deub-909/25/21 per nursing At risk for rhabdo due to diprivan -high triglycerides and to prevent stopped 05/18/2020.   Plan  - nursing care - monitor CK and lactate as prn  Best practice:  Diet: start TF Pain/Anxiety/Delirium protocol (if indicated): diprivan gtt + versed gtt VAP protocol (if indicated): N/a DVT prophylaxis: Lovenox GI prophylaxis: N/a Glucose control: ssi Mobility: bedrest Code Status: Full code Family Communication:  FMLA form done 9/21. Mom Meryl Dare [phone]) updated 9/21 at bedside and over phone 05/16/20 and again 9/23 over phone and 05/19/20. Mom was updated by nursing 05/20/20  Disposition: Continued ICU care     ATTESTATION & SIGNATURE  Olene Craven, MS4  LABS   PULMONARY Recent Labs  Lab 05/15/20 2205  PHART 7.533*  PCO2ART 34.6  PO2ART 589*  HCO3 29.7*  TCO2 31  O2SAT 100.0   CBC Recent Labs  Lab 05/18/20 0540 05/19/20 2324 05/21/20 0035  HGB 8.6* 9.2* 9.3*  HCT 26.4* 28.8* 28.9*  WBC 7.8 5.8 11.7*  PLT 155 201 270   COAGULATION No results for input(s): INR in the last 168 hours.  CARDIAC  No results for input(s): TROPONINI in the last 168 hours. No results for input(s): PROBNP in the last 168 hours.  CHEMISTRY Recent Labs  Lab 05/16/20 0021 05/16/20 0021 05/17/20 1004 05/17/20 1004 05/18/20 0540 05/18/20 0540 05/19/20 0421 05/20/20 0527 05/21/20 0522 05/22/20 0422  NA 133*  --  137  --  139  --   --  138 137  --   K 3.2*   < > 4.3   < > 4.0   < >  --  4.0 3.8  --   CL 101  --  112*  --  113*  --   --  114* 112*  --   CO2 20*  --  18*  --  17*  --   --  16* 17*  --   GLUCOSE 182*  --  167*  --  97  --   --  145* 126*  --   BUN 9  --  9  --  10  --   --  15  14  --   CREATININE 0.80  --  0.66  --  0.68  --   --  0.70 0.81  --   CALCIUM 9.2  --  7.5*  --  7.3*  --   --  8.4* 8.7*  --   MG 1.5*  --  1.8  --  1.5*  --   --  1.6* 1.8  --   PHOS 4.1   < > 3.5   < > 3.8  --  5.2* 5.3* 4.0 3.3   < > = values in this interval not displayed.   Estimated Creatinine Clearance: 88.5 mL/min (by C-G formula based on SCr of 0.81 mg/dL).  LIVER Recent Labs  Lab 05/16/20 0021 05/17/20 1004 05/18/20 0540 05/20/20 0527 05/21/20 0522  AST 49* ALT 32 ALKPHOS 30* 22* 20* 22* 31*  BILITOT 0.9 0.5 0.6 0.3 0.3  PROT 6.5 5.1* 4.8* 5.1* 6.0*  ALBUMIN 3.2* 2.1* 1.8* 1.8* 2.4*    INFECTIOUS Recent Labs  Lab 05/16/20 0946 05/16/20 0946 05/17/20 1004 05/18/20 0540 05/20/20 0527  LATICACIDVEN 3.6*   < > 1.5 1.0 0.8  PROCALCITON <0.10  --   --   --   --    < > = values in this interval not displayed.    ENDOCRINE CBG (last 3)  Recent Labs    05/22/20 0301 05/22/20 0523 05/22/20 0728  GLUCAP 137* 164* 143*    IMAGING x48h  - image(s) personally visualized  -   highlighted in bold DG CHEST PORT 1 VIEW  Result Date: 05/21/2020 CLINICAL DATA:  Endotracheal tube follow-up EXAM: PORTABLE CHEST 1 VIEW COMPARISON:  05/20/2020 FINDINGS: Mild cardiac enlargement. Endotracheal tube, enteric tube, and right PICC line are unchanged in position. There is evidence of infiltration in the lung bases, greater on the left. Similar appearance to previous study. No pleural effusions. No pneumothorax. Mediastinal contours appear intact. IMPRESSION: Persistent infiltration in the lung bases, greater on the left. Appliances are unchanged in position. Electronically Signed   By: Burman Nieves M.D.   On: 05/21/2020 05:06   05/22/2020, 11:07 AM   Critical care attending attestation note:  Patient seen and examined and relevant ancillary tests reviewed.  I agree with the assessment and plan of care as outlined by Belinda Fisher, MS IV.  Synopsis of  assessment and plan:  29 year old woman who is critically ill due to status epilepticus from PRES requiring titration of sedation to prevent seizures and mechanical ventilation for airway protection.  She has been titrated off all sedatives with no recurrence of status. She is opening her eyes and moving semi-purposefully in all extremities. Edema is improving but still significant facial edema. Chest clear, HS normal, abdomen soft and not tender. No skin breakdown.   She has passed an SBT but mental status still precludes extubation.   Assessment:  Continue daily SBT - extubate once awake.  Continue current AED's Allow autodiuresis. Antihypertensives increased to treat tachycardia and hypertension.    CRITICAL CARE Performed by: Lynnell Catalan   Total critical care time: 40 minutes  Critical care time was exclusive of separately billable procedures and treating other patients.  Critical care was necessary to treat or prevent imminent or life-threatening deterioration.  Critical care was time spent personally by me on the following activities: development of treatment plan with patient and/or surrogate as well as nursing, discussions with consultants, evaluation of patient's response to treatment, examination of patient, obtaining history from patient or surrogate, ordering and performing treatments and interventions, ordering and review of laboratory studies, ordering and review of radiographic studies, pulse oximetry, re-evaluation of patient's condition  and participation in multidisciplinary rounds.  Lynnell Catalan, MD [facility] ICU Physician [facility]  Pager: [phone] Mobile: [phone] After hours: [phone].  05/22/2020, 5:09 PM

## 2020-05-22 NOTE — Progress Notes (Signed)
eLink Physician-Brief Progress Note Patient Name: Donna Short DOB: [birth year] MRN: [id number]   Date of Service  05/22/2020  HPI/Events of Note  Agitation - Patient trying to pull out ETT. Request for bilateral soft wrist restraints.   eICU Interventions  Plan: 1. Bilateral soft wrist restraints X 12 hours.      Intervention Category Major Interventions: Delirium, psychosis, severe agitation - evaluation and management  Shane Melby Eugene 05/22/2020, 8:03 PM

## 2020-05-22 NOTE — Procedures (Signed)
Patient Name:Donna Short WHQ:[id number] Epilepsy Attending:Kaira Short [name] Referring Physician/Provider:Dr Brooke Dare Duration:05/21/2020 [id number] to 9/28/[id number]  Patient history:29 year old female with history of alcohol abuse admitted with acute pancreatitis with hospital admission complicated with significantly elevated blood pressure, withdrawal, seizure and worsening mental status. EEG to evaluate for seizure.  Level of alertness:comatose  AEDs during EEG study:LEV, PB, Ketamine, Versed  Technical aspects: This EEG study was done with scalp electrodes positioned according to the 10-20 International system of electrode placement. Electrical activity was acquired at a sampling rate of 500Hz  and reviewed with a high frequency filter of 70Hz  and a low frequency filter of 1Hz . EEG data were recorded continuously and digitally stored.   Description:No posterior dominant rhythm was seen.EEGshowed continuous generalized and lateralized left hemisphere 6-9hz , frontally predominant theta-alpha activity. Sharp waves were seen in left frototemporal region. EEG was reactive to noxious stimulation.  ABNORMALITY - Continuous slow, generalized and lateralized left hemisphere - Sharp wave, left frontotemporal region  IMPRESSION: This study issuggestive ofepileptogenicity in left frontotemporal region as well as cortical dysfunction in left hemisphere likely secondary to underlying to underlying structural abnormality. Additionally there is severediffuse encephalopathylikely due to sedation.No seizure were seen during this study.  Dhiren Azimi 

## 2020-05-22 NOTE — Progress Notes (Addendum)
Subjective: No seizures overnight.  ROS: Unable to obtain due to poor mental status  Examination  Vital signs in last 24 hours: Temp:  [98.4 F (36.9 C)-100.7 F (38.2 C)] 100.2 F (37.9 C) (09/28 0729) Pulse Rate:  [109-137] 128 (09/28 0920) Resp:  [16-39] 27 (09/28 0800) BP: (119-175)/(90-133) 165/121 (09/28 0920) SpO2:  [95 %-100 %] 100 % (09/28 0800) FiO2 (%):  [40 %] 40 % (09/28 0730) Weight:  [64.2 kg] 64.2 kg (09/28 0439)  General: lying in bed, not in apparent distress CVS: pulse-normal rate and rhythm RS: breathing comfortably, intubated Extremities: normal, warm  Neuro: On Versed at 5,  opens eyes with repeated noxious stimuli, pupils equally round and reactive, corneal reflex present, gag reflex present, withdraws to noxious stimuli with antigravity strength in bilateral upper extremity and 2/5 in bilateral lower extremities.  Basic Metabolic Panel: Recent Labs  Lab 05/16/20 0021 05/16/20 0021 05/17/20 1004 05/17/20 1004 05/18/20 0540 05/19/20 0421 05/20/20 0527 05/21/20 0522 05/22/20 0422  NA 133*  --  137  --  139  --  138 137  --   K 3.2*  --  4.3  --  4.0  --  4.0 3.8  --   CL 101  --  112*  --  113*  --  114* 112*  --   CO2 20*  --  18*  --  17*  --  16* 17*  --   GLUCOSE 182*  --  167*  --  97  --  145* 126*  --   BUN 9  --  9  --  10  --  15 14  --   CREATININE 0.80  --  0.66  --  0.68  --  0.70 0.81  --   CALCIUM 9.2   < > 7.5*   < > 7.3*  --  8.4* 8.7*  --   MG 1.5*  --  1.8  --  1.5*  --  1.6* 1.8  --   PHOS 4.1   < > 3.5   < > 3.8 5.2* 5.3* 4.0 3.3   < > = values in this interval not displayed.    CBC: Recent Labs  Lab 05/16/20 0021 05/17/20 1004 05/18/20 0540 05/19/20 2324 05/21/20 0035  WBC 8.1 9.2 7.8 5.8 11.7*  HGB 13.1 9.3* 8.6* 9.2* 9.3*  HCT 37.7 28.0* 26.4* 28.8* 28.9*  MCV 99.0 105.7* 107.8* 107.1* 107.8*  PLT 93* 130* 155 201 270     Coagulation Studies: No results for input(s): LABPROT, INR in the last 72  hours.  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 -Continue Keppra 1500 mg twice daily, Dilantin 75 mg every 8 hours, phenobarb 65 mg twice daily (phenobarb level 26.6 on 05/21/2020) -If patient has any breakthrough seizures or EEG starts worsening, will add another antiepileptic like topiramate. -Continue strict management of blood pressure with goal SBP less than 140 -Continue seizure precautions - Management of rest of comorbidities per primary team  CRITICAL CARE Performed by: [name]   Total critical care time: 35 minutes  Critical care time  was exclusive of separately billable procedures and treating other patients.  Critical care was necessary to treat or prevent imminent or life-threatening deterioration.  Critical care was time spent personally by me on the following activities: development of treatment plan with patient and/or surrogate as well as nursing, discussions with consultants, evaluation of patient's response to treatment, examination of patient, obtaining history from patient or surrogate, ordering and performing treatments and interventions, ordering and review of laboratory studies, ordering and review of radiographic studies, pulse oximetry and re-evaluation of patient's condition.   Lindie Spruce Epilepsy [facility] For questions after 5pm please refer to AMION to reach the Neurologist on call

## 2020-05-22 NOTE — Progress Notes (Signed)
Wasted 340 ml of Versed gtt with Meredith Staggers, RN

## 2020-05-23 LAB — COMPREHENSIVE METABOLIC PANEL
ALT: 22 U/L (ref 0–44)
AST: 29 U/L (ref 15–41)
Albumin: 2.1 g/dL — ABNORMAL LOW (ref 3.5–5.0)
Alkaline Phosphatase: 40 U/L (ref 38–126)
Anion gap: 10 (ref 5–15)
BUN: 15 mg/dL (ref 6–20)
CO2: 26 mmol/L (ref 22–32)
Calcium: 9.1 mg/dL (ref 8.9–10.3)
Chloride: 98 mmol/L (ref 98–111)
Creatinine, Ser: 0.59 mg/dL (ref 0.44–1.00)
GFR calc Af Amer: >60 mL/min (ref >60)
GFR calc non Af Amer: >60 mL/min (ref >60)
Glucose, Bld: 111 mg/dL — ABNORMAL HIGH (ref 70–99)
Potassium: 3.8 mmol/L (ref 3.5–5.1)
Sodium: 134 mmol/L — ABNORMAL LOW (ref 135–145)
Total Bilirubin: 0.4 mg/dL (ref 0.3–1.2)
Total Protein: 6.1 g/dL — ABNORMAL LOW (ref 6.5–8.1)

## 2020-05-23 LAB — GLUCOSE, CAPILLARY
Glucose-Capillary: 100 mg/dL — ABNORMAL HIGH (ref 70–99)
Glucose-Capillary: 108 mg/dL — ABNORMAL HIGH (ref 70–99)
Glucose-Capillary: 117 mg/dL — ABNORMAL HIGH (ref 70–99)
Glucose-Capillary: 125 mg/dL — ABNORMAL HIGH (ref 70–99)
Glucose-Capillary: 58 mg/dL — ABNORMAL LOW (ref 70–99)
Glucose-Capillary: 62 mg/dL — ABNORMAL LOW (ref 70–99)
Glucose-Capillary: 88 mg/dL (ref 70–99)
Glucose-Capillary: 92 mg/dL (ref 70–99)

## 2020-05-23 LAB — CBC WITH DIFFERENTIAL/PLATELET
Abs Immature Granulocytes: 0.2 10*3/uL — ABNORMAL HIGH (ref 0.00–0.07)
Basophils Absolute: 0.1 10*3/uL (ref 0.0–0.1)
Basophils Relative: 1 %
Eosinophils Absolute: 0.2 10*3/uL (ref 0.0–0.5)
Eosinophils Relative: 2 %
HCT: 26.6 % — ABNORMAL LOW (ref 36.0–46.0)
Hemoglobin: 9.2 g/dL — ABNORMAL LOW (ref 12.0–15.0)
Immature Granulocytes: 2 %
Lymphocytes Relative: 18 %
Lymphs Abs: 2.4 10*3/uL (ref 0.7–4.0)
MCH: 35.1 pg — ABNORMAL HIGH (ref 26.0–34.0)
MCHC: 34.6 g/dL (ref 30.0–36.0)
MCV: 101.5 fL — ABNORMAL HIGH (ref 80.0–100.0)
Monocytes Absolute: 1.8 10*3/uL — ABNORMAL HIGH (ref 0.1–1.0)
Monocytes Relative: 13 %
Neutro Abs: 8.6 10*3/uL — ABNORMAL HIGH (ref 1.7–7.7)
Neutrophils Relative %: 64 %
Platelets: 279 10*3/uL (ref 150–400)
RBC: 2.62 MIL/uL — ABNORMAL LOW (ref 3.87–5.11)
RDW: 11.2 % — ABNORMAL LOW (ref 11.5–15.5)
WBC: 13.2 10*3/uL — ABNORMAL HIGH (ref 4.0–10.5)
nRBC: 0 % (ref 0.0–0.2)

## 2020-05-23 LAB — PHOSPHORUS: Phosphorus: 3.7 mg/dL (ref 2.5–4.6)

## 2020-05-23 MED ORDER — PROPOFOL 10 MG/ML IV BOLUS
50.0000 mg | Freq: Once | INTRAVENOUS | Status: AC
  Administered 2020-05-23: 50 mg via INTRAVENOUS
  Filled 2020-05-23: qty 20

## 2020-05-23 MED ORDER — DEXMEDETOMIDINE HCL IN NACL 400 MCG/100ML IV SOLN
0.4000 ug/kg/h | INTRAVENOUS | Status: DC
  Administered 2020-05-23: 0.4 ug/kg/h via INTRAVENOUS
  Administered 2020-05-23: 0.8 ug/kg/h via INTRAVENOUS
  Administered 2020-05-24 (×2): 1 ug/kg/h via INTRAVENOUS
  Administered 2020-05-24 – 2020-05-25 (×3): 1.2 ug/kg/h via INTRAVENOUS
  Administered 2020-05-25: 1 ug/kg/h via INTRAVENOUS
  Administered 2020-05-25 – 2020-05-29 (×14): 1.2 ug/kg/h via INTRAVENOUS
  Administered 2020-05-29: 0.4 ug/kg/h via INTRAVENOUS
  Administered 2020-05-29: 1.2 ug/kg/h via INTRAVENOUS
  Administered 2020-05-30: 0.6 ug/kg/h via INTRAVENOUS
  Administered 2020-05-30: 0.4 ug/kg/h via INTRAVENOUS
  Administered 2020-05-31: 0.6 ug/kg/h via INTRAVENOUS
  Filled 2020-05-23 (×19): qty 100
  Filled 2020-05-23: qty 200
  Filled 2020-05-23 (×8): qty 100

## 2020-05-23 MED ORDER — PROSOURCE TF PO LIQD
45.0000 mL | Freq: Three times a day (TID) | ORAL | Status: DC
  Administered 2020-05-23 – 2020-05-31 (×24): 45 mL
  Filled 2020-05-23 (×24): qty 45

## 2020-05-23 MED ORDER — CLEVIDIPINE BUTYRATE 0.5 MG/ML IV EMUL
0.0000 mg/h | INTRAVENOUS | Status: DC
  Administered 2020-05-23 – 2020-05-24 (×2): 1 mg/h via INTRAVENOUS
  Administered 2020-05-25: 2 mg/h via INTRAVENOUS
  Administered 2020-05-26: 1 mg/h via INTRAVENOUS
  Administered 2020-05-26: 5 mg/h via INTRAVENOUS
  Administered 2020-05-27 (×2): 2 mg/h via INTRAVENOUS
  Administered 2020-05-27: 1 mg/h via INTRAVENOUS
  Administered 2020-05-28: 2 mg/h via INTRAVENOUS
  Filled 2020-05-23 (×4): qty 50
  Filled 2020-05-23 (×2): qty 100
  Filled 2020-05-23 (×4): qty 50

## 2020-05-23 MED ORDER — DEXTROSE 50 % IV SOLN
INTRAVENOUS | Status: AC
  Filled 2020-05-23: qty 50

## 2020-05-23 MED ORDER — VITAL 1.5 CAL PO LIQD
1000.0000 mL | ORAL | Status: DC
  Administered 2020-05-23 – 2020-05-27 (×5): 1000 mL
  Filled 2020-05-23 (×3): qty 1000

## 2020-05-23 MED ORDER — SUCCINYLCHOLINE CHLORIDE 20 MG/ML IJ SOLN
100.0000 mg | Freq: Once | INTRAMUSCULAR | Status: AC
  Administered 2020-05-23: 100 mg via INTRAVENOUS
  Filled 2020-05-23: qty 5

## 2020-05-23 MED ORDER — FENTANYL CITRATE (PF) 100 MCG/2ML IJ SOLN
50.0000 ug | Freq: Once | INTRAMUSCULAR | Status: AC
  Administered 2020-05-23: 50 ug via INTRAVENOUS
  Filled 2020-05-23: qty 2

## 2020-05-23 NOTE — Procedures (Signed)
Intubation Procedure Note  Donna Short [id number] Dec 07, 1990  Date: 05/23/20 Time: 4:00 PM  Provider Performing: Merrilyn Puma, Daleen Bo Agarwala  Procedure: Intubation (31500)  Indication(s): Inability to clear secretions and sustain airway  Consent: Risks of the procedure as well as the alternatives and risks of each were explained to the patient and/or caregiver. Consent for the procedure was obtained as a verbal informed consent from the mother.  Anesthesia Fentanyl, Propofol, Succinylcholine  Time Out Verified patient identification, verified procedure, site/side was marked, verified correct patient position, special equipment/implants available, medications/allergies/relevant history reviewed, required imaging and test results available.  Sterile Technique Usual hand hygiene, masks, and gloves were used  Procedure Description: Patient positioned in bed supine. Sedation given as noted above. BVM breaths administered for pre-oxygenation. Patient intubated with endotracheal tube using Glidescope. View was Grade 1 full glottis. Number of attempts was 1. Colorimetric CO2 detector was consistent with tracheal placement.  Complications/Tolerance: None; patient tolerated the procedure well. CXR ordered to verify placement.  EBL None  Specimen(s) None    Merrilyn Puma, MD 05/23/2020, 4:12 PM

## 2020-05-23 NOTE — Progress Notes (Signed)
NAME:  Donna Short, MRN:  [id number], DOB:  07/10/1991, LOS: 12 ADMISSION DATE:  05/10/2020, CONSULTATION DATE:  05/14/20 REFERRING MD:  Loney Loh, CHIEF COMPLAINT:  Encephalopathy   Brief History   Donna Short is a 29 y.o. female with a PMHx of EtOH abuse and HTN admitted to [facility] 9/17 for acute on chronic pancreatitis and EtOH withdrawal. During her hospital stay she developed uncontrolled HTN and breakthrough seizure activity with MRI findings consistent with PRES.   Past Medical History   has a past medical history of Asthma, Depression, History of chicken pox, Hypertension, Migraines, Pancreatitis (01/2018), and Seizures (HCC). Allergies  Allergen Reactions  . Claritin [Loratadine] Itching  . Peanut-Containing Drug Products Other (See Comments)  . Benadryl [Diphenhydramine Hcl (Sleep)] Rash  . Latex Rash  . Shrimp [Shellfish Allergy] Hives and Rash     Significant Hospital Events   9/16 - admit 9/18, 9/19 breakthrough seizures 9/20- MRI showing PRES 9/21 -Patient had a hypoglycemic episode with BG 61 and shaking overnight consistent with previous episodes past 2 days. Symptoms responded well to 8 oz of juice and BG improved to 72 within 30 minutes. Likely due to poor oral intake intubation for burst suppression  9/22  - 9/22 - intubated yesterday for burst suppressio needs -> post intubation hypothermic/then febrile 102F with hypotension/pressor need. Off cleviprex -> this morning off pressors.  Continued hypoglycemia -> needing more D5. Not yet on TF. Making urine. Neuro Dr Wilford Corner concerned about recurrent PRES (milder in Jan 2021). Wants to consider LP if fever persists and culture negative. Currently on diprivan gtt and MAP at goal - 80-90  9/23 -  9/23  EEG with continued PLED per neuro. Versed increaed from 30mg /h to 40mg /h. MAx diprivan. On keppra On neop. On d5 normal saline. On TF. 40% fio2 on vent. ECHO normal. No DVT on duplex. Afebrile. MAP at 90 at goal. No labs in  results today. AUtoimmune sent yestserday. UDS Sent yesterday. Off cleviprex  9/24 - on 40% FiO2.  On ventilator.  On low-dose Neo-Synephrine to keep the MAP between 80 and 90.  Not needing Cleviprex.  Has increased Versed needs.  Currently on Versed 90 mg/h and propofol 90 mcg.  Autoimmune profile negative except for weak positive ANA.  Has hypoglycemia despite tube feeds.  Started on D10 -infusion  9/25 - remains on 40% oxygen and PEEP of 5.  On the ventilator.  RASS sedation score -5.  Propofol has been stopped because of increased triglycerides.  Continues on Versed infusion ketamine has been started.  Neuro electrophysiologist -EEG showed improved PLEDs and now replaced by intermittent spikes.  Ongoing encephalopathy present.  Overall EEG significantly improved.  Also on phenobarb and Keppra.  Dilantin still on hold. Currently off neo because mean arterial pressure is automatically between 80 and 90  9/27- Ketamine discontinued and Versed down to 5mg /hr  Consults:  Neurology- consulted 9/20, recommended maintaining strict BP control with MAP goals 80-90, Continue Keppra 1500 mg twice daily, Dilantin 75 mg every 8 hours, phenobarb 65 mg twice daily (phenobarb level 26.6 on 05/21/2020)  Procedures:  ETT 9/21 PICC 9/22   Significant Diagnostic Tests:  9/16 = Abdominal CT- findings consistent with acute on chronic pancreatitis predominantly involving the proximal pancreas. No peripancreatic fluid collections are identified. EEG- 9/20, evidence of epileptogenicity arising from left posterior temporal region as well as cortical dysfunction in left hemisphere due to underlying structural abnormality, PRES MRI- 9/20, patchy/extensive brain edema in a pattern  consistent with PRES 9/22 - echo and duplex - normal 9/26 - CXR stable with lower lung infiltrates L>R  Micro Data:  SARS 9/16 - neg Trach 9/23 - nl flora Urine 9/22 - neg Blood 9/22 - neg so far MRSa PCR 9/20 -neg Autoimmune and  vasculitis panel 9/22 >> ANA weak positive but  Otherwise neg U Tox 9/22 - positive benzo (Versed infusion)  Antimicrobials:  Aztreoniam 9/22 (empiric sepsis)>> 9/22, Zosyn 9/22 >> 05/19/2020  Interim history/subjective:   05/23/2020 - ContinuallyTachycardic and hypertensive overnight, Had periods of agitation last night requiring fentanyl and versed, eventually soft restraints. Eeg stable from previous day and discontinued by Neuro.  On ventilator SBT.  On tube feeds.  She is moving and opening eyes intermittently but not following commands.  Objective   Blood pressure (!) 138/108, pulse (!) 110, temperature 98.5 F (36.9 C), temperature source Oral, resp. rate (!) 23, height 5\' 2"  (1.575 m), weight 60.5 kg, SpO2 100 %.    Vent Mode: PSV;CPAP FiO2 (%):  [40 %] 40 % Set Rate:  [18 bmp] 18 bmp Vt Set:  [400 mL] 400 mL PEEP:  [5 cmH20] 5 cmH20 Pressure Support:  [10 cmH20-14 cmH20] 10 cmH20 Plateau Pressure:  [8 cmH20-18 cmH20] 18 cmH20   Intake/Output Summary (Last 24 hours) at 05/23/2020 1136 Last data filed at 05/23/2020 0600 Gross per 24 hour  Intake 1500.49 ml  Output 3050 ml  Net -1549.51 ml   Filed Weights   05/21/20 0500 05/22/20 0439 05/23/20 0500  Weight: 64.4 kg 64.2 kg 60.5 kg   Gen: Critically ill looking young lady.  Mildly arousable on the ventilator. HEENT: Pupils equal, sluggishly reactive to light, moderate amount of secretion from ETT CV: Normal rhythm, S1, S2, Tachycardic, 2+ pulses Radial and DP, Cap refill <2 secs, BLE edema minimal, BUE 1+ edema in hands Pulm: Normal breath sounds, lungs clear at apex/bases  Abdomen: soft, slightly distended. Tolerating tube feeds. 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  Worse compared to Jan 2021.   P:   Per neuro EEG d/c today Continue Keppra 1500 mg twice daily, Dilantin 75 mg every 8 hours, phenobarb 65 mg twice daily (phenobarb level 26.6 on 05/21/2020) HTN control, SBP<140  A:  Acute Resp Failure - s/p intubation for burst suppression 05/14/20  P:   Extubate today Monitor O2, RR, Secretions and airway protection   A:   Hypertensive all admission, in setting of known PRES  -Mean arterial pressure above goal of 80 and 90,  MAP around 100-120 overnight  P:  MAP goal 80-90 - Continue home Lisinopril 20 mg daily -Metoprolol to 100 mg BID -Amlodipine 10 mg daily -Consider adding HCTZ for BP control  A: Tachycardia - Elevated starting 9/26, not responsive to Albumin, patient is afebrile without signs of sepsis and negative cultures to date  P: Continue Metoprolol and Lisinopril as above  A:   Hypothermic and then febrile 9/21-9/22 with lactic acidosis 3 MRSA PCR negative.  Urine strep negative Lactic acodiss + -resolved 05/17/2020 Off zosyn 9/25  05/23/2020 - afebrile. So far culture negative,.  P:   MR or LP based on course (per neuro)  A:   Pancreatitis (acute on chronic ) -  etoh 05/10/2020 - admit based on CT with lipase 101 at admit  P:   TF out after extubation Speech eval or bedside swallow Follow CBG, restart D10 if hypoglycemic   A:  anemia critical illness  05/23/2020 - no active bleeding  P:  - PRBC for hgb </= 6.9gm%   - exceptions are  -  if ACS susepcted/confirmed then transfuse for hgb </= 8.0gm%,  or   -  active bleeding with hemodynamic instability, then transfuse regardless of  hemoglobin value  -  At at all times try to transfuse 1 unit prbc as possible with exception of  active hemorrhage   HEMATOLOGIC - Platelets A Transient Thrombocytopenia [Baseline platelets 150-200; thrombocytopenia 80s in [facility] 2021) - 9/20 - 9/24 and resolved.  Duplex negative.  HIT antibody panel normal - 05/16/2020  P Continue  lovenox  ENDOCRINE A:   Hypoglycemia  -Ongoing despite tube feeds on 05/18/2020.  Improved 05/19/2020 after adding D10 - 05/23/20 AM BG WNL, one episode of 58 BG  P:   Speech eval Restart D10 if hyopglycemic SSI  MSK/DERM AT risk for Sacral deub-909/25/21 per nursing At risk for rhabdo due to diprivan -high triglycerides and to prevent stopped 05/18/2020.   Plan  - nursing care - monitor CK and lactate as prn  Best practice:  Diet: NPO until speech eval Pain/Anxiety/Delirium protocol (if indicated): fentanyl/dilaudid/versed prn VAP protocol (if indicated): N/a DVT prophylaxis: Lovenox GI prophylaxis: N/a Glucose control: ssi Mobility: bedrest Code Status: Full code Family Communication:  FMLA form done 9/21. Mom Meryl Dareammie Murray [phone] updated 9/21 at bedside and over phone 05/16/20 and again 9/23 over phone and 05/19/20. Mom was updated by nursing 05/20/20  Disposition: Continued ICU care   ATTESTATION & SIGNATURE  Olene CravenPatrick Taylor, MS4  LABS   PULMONARY No results for input(s): PHART, PCO2ART, PO2ART, HCO3, TCO2, O2SAT in the last 168 hours.  Invalid input(s): PCO2, PO2 CBC Recent Labs  Lab 05/19/20 2324 05/21/20 0035 05/23/20 0508  HGB 9.2* 9.3* 9.2*  HCT 28.8* 28.9* 26.6*  WBC 5.8 11.7* 13.2*  PLT 201 270 279   COAGULATION No results for input(s): INR in the last 168 hours.  CARDIAC  No results for input(s): TROPONINI in the last 168 hours. No results for input(s): PROBNP in the last 168 hours.  CHEMISTRY Recent Labs  Lab 05/17/20 1004 05/17/20 1004 05/18/20 0540 05/18/20 0540 05/19/20 0421 05/20/20 0527 05/20/20 0527 05/21/20 0522 05/22/20 0422 05/23/20 0508  NA 137  --  139  --   --  138  --  137  --  134*  K 4.3   < > 4.0   < >  --  4.0   < > 3.8  --  3.8  CL 112*  --  113*  --   --  114*  --  112*  --  98  CO2 18*  --  17*  --   --  16*  --  17*  --  26  GLUCOSE 167*  --  97  --   --  145*  --  126*  --  111*  BUN 9  --  10  --   --  15   --  14  --  15  CREATININE 0.66  --  0.68  --   --  0.70  --  0.81  --  0.59  CALCIUM 7.5*  --  7.3*  --   --  8.4*  --  8.7*  --  9.1  MG 1.8  --  1.5*  --   --  1.6*  --  1.8  --   --   PHOS 3.5   < > 3.8   < > 5.2* 5.3*  --  4.0 3.3 3.7   < > = values in this interval not displayed.   Estimated Creatinine Clearance: 87.3 mL/min (by C-G formula based on SCr of 0.59 mg/dL).  LIVER Recent Labs  Lab 05/17/20 1004 05/18/20 0540 05/20/20 0527 05/21/20 0522 05/23/20 0508  AST 26 21 18 30 29   ALT 24 21 16 17 22   ALKPHOS 22* 20* 22* 31* 40  BILITOT 0.5 0.6 0.3 0.3 0.4  PROT 5.1* 4.8* 5.1* 6.0* 6.1*  ALBUMIN 2.1* 1.8* 1.8* 2.4* 2.1*    INFECTIOUS Recent Labs  Lab 05/17/20 1004 05/18/20 0540 05/20/20 0527  LATICACIDVEN 1.5 1.0 0.8    ENDOCRINE CBG (last 3)  Recent Labs    05/23/20 0335 05/23/20 0802 05/23/20 0807  GLUCAP 100* 58* 92    IMAGING x48h  - image(s) personally visualized  -   highlighted in bold No results found. 05/23/2020, 11:36 AM   Critical care attending attestation note:  Patient seen and examined and relevant ancillary tests reviewed.  I agree with the assessment and plan of care as outlined by 05/25/20, MS IV.   Synopsis of assessment and plan:  29 year old woman who is critically ill due to status epilepticus from PRES requiring titration of sedation to prevent seizures and mechanical ventilation for airway protection.  She is off all sedation will cough and move spontaneously but not follow commands.  She has good trunk control.  She has needed doses of sedative to tolerate the endotracheal tube.  On examination: Somnolent moves all extremities vigorous cough not following commands.  Chest clear.  No desaturation with cough or dyssynchrony.  Heart sounds unremarkable.  Abdomen soft and nontender.  Trace edema.  No focal neurological deficits.  Assessment:  Resolving encephalopathy due to press.  Should continue to improve if sedation can  be avoided.  She appears to have sufficient airway protective reflexes to attempt a trial of extubation. May need Cleviprex to control blood pressure while enteral access is reestablished. Small bore feeding tube ordered.  CRITICAL CARE Performed by: Belinda Fisher   Total critical care time: 35 minutes  Critical care time was exclusive of separately billable procedures and treating other patients.  Critical care was necessary to treat or prevent imminent or life-threatening deterioration.  Critical care was time spent personally by me on the following activities: development of treatment plan with patient and/or surrogate as well as nursing, discussions with consultants, evaluation of patient's response to treatment, examination of patient, obtaining history from patient or surrogate, ordering and performing treatments and interventions, ordering and review of laboratory studies, ordering and review of radiographic studies, pulse oximetry, re-evaluation of patient's condition and participation in multidisciplinary rounds.  38, MD [facility] ICU Physician [facility] [place] [facility]  Pager: [phone] Mobile: [phone] After hours: [phone].  05/23/2020, 1:19 PM

## 2020-05-23 NOTE — Progress Notes (Signed)
Nutrition Follow-up   DOCUMENTATION CODES:   Not applicable  INTERVENTION:   Tube feeding:  -Vital 1.5 @ 50 ml/hr via Cortrak (1200 ml) -45 ml ProSource TID  Provides: 1920 kcals, 114 grams protein, 917 ml free water.   NUTRITION DIAGNOSIS:   Inadequate oral intake related to inability to eat as evidenced by NPO status.  Ongoing   GOAL:   Patient will meet greater than or equal to 90% of their needs   Addressed via TF  MONITOR:   Vent status, TF tolerance, Labs  REASON FOR ASSESSMENT:   Ventilator, Consult Enteral/tube feeding initiation and management  ASSESSMENT:    29 yo female admitted with acute on chronic pancreatitis and alcohol withdrawal. PMH includes alcohol abuse, HTN.   9/29- extubated   Pt discussed during ICU rounds and with RN.   Pt unable to speak or follow commands. Gastric Cortrak placed until SLP can evaluate. Adjust tube feeding to better meet needs now that pt is extubated. Monitor CBGs as pt has struggled with persistent hypoglycemia. D10 currently off.   Admission weight: 60.3 kg  Current weight: 60.5 kg   UOP: 3050 ml x 24 hrs   Medications: colace, folic acid, MVI with minerals, miralax, thiamine Labs: Na 134 (L) CBG 58-143  Diet Order:   Diet Order            Diet NPO time specified  Diet effective now                 EDUCATION NEEDS:   Not appropriate for education at this time  Skin:  Skin Assessment: Reviewed RN Assessment  Last BM:  9/28  Height:   Ht Readings from Last 1 Encounters:  05/10/20 5\' 2"  (1.575 m)    Weight:   Wt Readings from Last 1 Encounters:  05/23/20 60.5 kg    Ideal Body Weight:  50 kg  BMI:  Body mass index is 24.4 kg/m.  Estimated Nutritional Needs:   Kcal:  1800-2000 kcal  Protein:  100-115 grams  Fluid:  >/= 1.8 L/day  05/25/20 RD, LDN Clinical Nutrition Pager listed in AMION

## 2020-05-23 NOTE — Progress Notes (Signed)
vLTM EEG Complete. New Skin breakdown noted on electrode site F3. Scabbing on old breakdown on forehead. Let nursing know.

## 2020-05-23 NOTE — Procedures (Addendum)
Patient Name:Donna Short WYS:[id number] Epilepsy Attending:Rainey Rodger [name] Referring Physician/Provider:Dr Brooke Dare Duration:05/22/2020 [phone] to 9/29/[id number]  Patient history:29 year old female with history of alcohol abuse admitted with acute pancreatitis with hospital admission complicated with significantly elevated blood pressure, withdrawal, seizure and worsening mental status. EEG to evaluate for seizure.  Level of alertness:comatose  AEDs during EEG study:LEV, PB  Technical aspects: This EEG study was done with scalp electrodes positioned according to the 10-20 International system of electrode placement. Electrical activity was acquired at a sampling rate of 500Hz  and reviewed with a high frequency filter of 70Hz  and a low frequency filter of 1Hz . EEG data were recorded continuously and digitally stored.   Description:No posterior dominant rhythm was seen.EEGshowed continuous generalized and lateralized left hemisphere 6-9hz , frontally predominant theta-alpha activity. Sharp waves were seen in left frototemporal region, at times periodic at 1Hz .   ABNORMALITY - Continuous slow, generalized and lateralized left hemisphere - Sharp wave, left frontotemporal region  IMPRESSION: This study issuggestive ofepileptogenicity in left frontotemporal region as well as cortical dysfunction in left hemisphere likely secondary to underlying to underlying structural abnormality. Additionally there is severediffuse encephalopathylikely due to sedation.No seizure were seen during this study.  [name] 

## 2020-05-23 NOTE — Procedures (Signed)
Cortrak  Person Inserting Tube:  King, Drea Jurewicz E, RD Tube Type:  Cortrak - 43 inches Tube Location:  Right nare Initial Placement:  Stomach Secured by: Bridle Technique Used to Measure Tube Placement:  Documented cm marking at nare/ corner of mouth Cortrak Secured At:  69 cm    Cortrak Tube Team Note:  Consult received to place a Cortrak feeding tube.   No x-ray is required. RN may begin using tube.   If the tube becomes dislodged please keep the tube and contact the Cortrak team at [email or web address] ([email or web address]) for replacement.  If after hours and replacement cannot be delayed, place a NG tube and confirm placement with an abdominal x-ray.    Leigha Olberding King, MS, RD, LDN Pager number available on Amion 

## 2020-05-23 NOTE — Progress Notes (Addendum)
Subjective: No clinical seizures overnight.  ROS: Unable to obtain due to poor mental status  Examination  Vital signs in last 24 hours: Temp:  [98.4 F (36.9 C)-99.8 F (37.7 C)] 98.5 F (36.9 C) (09/29 0337) Pulse Rate:  [104-131] 106 (09/29 0705) Resp:  [13-30] 19 (09/29 0705) BP: (107-184)/(87-129) 139/115 (09/29 0630) SpO2:  [99 %-100 %] 100 % (09/29 0759) FiO2 (%):  [40 %] 40 % (09/29 0705) Weight:  [60.5 kg] 60.5 kg (09/29 0500)  General: lying in bed,not in apparent distress CVS: pulse-normal rate and rhythm RS: breathing comfortably,intubated Extremities: normal,warm  Neuro:Opens eyes spontaneously,  not following commands, pupils equally round and reactive, forcefully closes eyes when I attempted to open them, coughing, spontaneously moving all 4 extremities  Basic Metabolic Panel: Recent Labs  Lab 05/17/20 1004 05/17/20 1004 05/18/20 0540 05/18/20 0540 05/19/20 0421 05/20/20 0527 05/21/20 0522 05/22/20 0422 05/23/20 0508  NA 137  --  139  --   --  138 137  --  134*  K 4.3  --  4.0  --   --  4.0 3.8  --  3.8  CL 112*  --  113*  --   --  114* 112*  --  98  CO2 18*  --  17*  --   --  16* 17*  --  26  GLUCOSE 167*  --  97  --   --  145* 126*  --  111*  BUN 9  --  10  --   --  15 14  --  15  CREATININE 0.66  --  0.68  --   --  0.70 0.81  --  0.59  CALCIUM 7.5*   < > 7.3*   < >  --  8.4* 8.7*  --  9.1  MG 1.8  --  1.5*  --   --  1.6* 1.8  --   --   PHOS 3.5   < > 3.8   < > 5.2* 5.3* 4.0 3.3 3.7   < > = values in this interval not displayed.    CBC: Recent Labs  Lab 05/17/20 1004 05/18/20 0540 05/19/20 2324 05/21/20 0035 05/23/20 0508  WBC 9.2 7.8 5.8 11.7* 13.2*  NEUTROABS  --   --   --   --  8.6*  HGB 9.3* 8.6* 9.2* 9.3* 9.2*  HCT 28.0* 26.4* 28.8* 28.9* 26.6*  MCV 105.7* 107.8* 107.1* 107.8* 101.5*  PLT 130* 155 201 270 279     Coagulation Studies: No results for input(s): LABPROT, INR in the last 72 hours.  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  Recommendations - Discontinue LTM as no further seizures overnight -Continue Keppra 1500 mg twice daily, Dilantin 75 mg every 8 hours, phenobarb 65 mg twice daily (phenobarb level 26.6 on 05/21/2020) -If patient has any breakthrough seizures or EEG starts worsening, will add another antiepileptic like topiramate. -Continue strict management of blood pressure with goal SBP less than 140 -Continue seizure precautions - Management of rest of comorbidities per primary team   I have spent a total of 25 minutes with the patient reviewing hospital notes,  test results, labs and examining the patient as well as establishing an assessment and plan.  >  50% of time was spent in direct patient care.     Lindie Spruce Epilepsy [facility] For questions after 5pm please refer to AMION to reach the Neurologist on call

## 2020-05-23 NOTE — Progress Notes (Signed)
Patient extubated to 6 L [place].  Patient unable to speak or follow commands. Weak, productive cough.  Patient drooling and many oral secretions. Will continue to monitor patient.

## 2020-05-23 NOTE — Progress Notes (Signed)
Hypoglycemic Event  CBG: 62  Treatment: 4 oz juice/soda  Symptoms: None  Follow-up CBG: Time:9:16 PM  CBG Result:125  Possible Reasons for Event: Inadequate meal intake  Comments/MD notified:Tube feed had been off since extubation, restarted per MD order.    Della Goo

## 2020-05-24 ENCOUNTER — Inpatient Hospital Stay (HOSPITAL_COMMUNITY): Payer: Managed Care, Other (non HMO)

## 2020-05-24 LAB — COMPREHENSIVE METABOLIC PANEL
ALT: 24 U/L (ref 0–44)
AST: 33 U/L (ref 15–41)
Albumin: 2.3 g/dL — ABNORMAL LOW (ref 3.5–5.0)
Alkaline Phosphatase: 45 U/L (ref 38–126)
Anion gap: 10 (ref 5–15)
BUN: 20 mg/dL (ref 6–20)
CO2: 29 mmol/L (ref 22–32)
Calcium: 9.4 mg/dL (ref 8.9–10.3)
Chloride: 96 mmol/L — ABNORMAL LOW (ref 98–111)
Creatinine, Ser: 0.62 mg/dL (ref 0.44–1.00)
GFR calc Af Amer: >60 mL/min (ref >60)
GFR calc non Af Amer: >60 mL/min (ref >60)
Glucose, Bld: 144 mg/dL — ABNORMAL HIGH (ref 70–99)
Potassium: 3.8 mmol/L (ref 3.5–5.1)
Sodium: 135 mmol/L (ref 135–145)
Total Bilirubin: 0.4 mg/dL (ref 0.3–1.2)
Total Protein: 6.2 g/dL — ABNORMAL LOW (ref 6.5–8.1)

## 2020-05-24 LAB — GLUCOSE, CAPILLARY
Glucose-Capillary: 132 mg/dL — ABNORMAL HIGH (ref 70–99)
Glucose-Capillary: 154 mg/dL — ABNORMAL HIGH (ref 70–99)
Glucose-Capillary: 140 mg/dL — ABNORMAL HIGH (ref 70–99)
Glucose-Capillary: 185 mg/dL — ABNORMAL HIGH (ref 70–99)
Glucose-Capillary: 136 mg/dL — ABNORMAL HIGH (ref 70–99)
Glucose-Capillary: 150 mg/dL — ABNORMAL HIGH (ref 70–99)

## 2020-05-24 LAB — CBC WITH DIFFERENTIAL/PLATELET
Abs Immature Granulocytes: 0.15 10*3/uL — ABNORMAL HIGH (ref 0.00–0.07)
Basophils Absolute: 0.1 10*3/uL (ref 0.0–0.1)
Basophils Relative: 1 %
Eosinophils Absolute: 0.2 10*3/uL (ref 0.0–0.5)
Eosinophils Relative: 1 %
HCT: 25.5 % — ABNORMAL LOW (ref 36.0–46.0)
Hemoglobin: 8.6 g/dL — ABNORMAL LOW (ref 12.0–15.0)
Immature Granulocytes: 1 %
Lymphocytes Relative: 11 %
Lymphs Abs: 1.8 10*3/uL (ref 0.7–4.0)
MCH: 34.3 pg — ABNORMAL HIGH (ref 26.0–34.0)
MCHC: 33.7 g/dL (ref 30.0–36.0)
MCV: 101.6 fL — ABNORMAL HIGH (ref 80.0–100.0)
Monocytes Absolute: 1.5 10*3/uL — ABNORMAL HIGH (ref 0.1–1.0)
Monocytes Relative: 9 %
Neutro Abs: 13.2 10*3/uL — ABNORMAL HIGH (ref 1.7–7.7)
Neutrophils Relative %: 77 %
Platelets: 297 10*3/uL (ref 150–400)
RBC: 2.51 MIL/uL — ABNORMAL LOW (ref 3.87–5.11)
RDW: 11.4 % — ABNORMAL LOW (ref 11.5–15.5)
WBC: 17 10*3/uL — ABNORMAL HIGH (ref 4.0–10.5)
nRBC: 0 % (ref 0.0–0.2)

## 2020-05-24 LAB — PHOSPHORUS: Phosphorus: 3.9 mg/dL (ref 2.5–4.6)

## 2020-05-24 MED ORDER — DEXAMETHASONE SODIUM PHOSPHATE 4 MG/ML IJ SOLN
4.0000 mg | Freq: Four times a day (QID) | INTRAMUSCULAR | Status: AC
  Administered 2020-05-24 – 2020-05-25 (×4): 4 mg via INTRAVENOUS
  Filled 2020-05-24 (×4): qty 1

## 2020-05-24 MED ORDER — HYDROCHLOROTHIAZIDE 25 MG PO TABS
25.0000 mg | ORAL_TABLET | Freq: Every day | ORAL | Status: DC
  Administered 2020-05-24: 25 mg
  Filled 2020-05-24 (×2): qty 1

## 2020-05-24 MED ORDER — DEXAMETHASONE SODIUM PHOSPHATE 10 MG/ML IJ SOLN
10.0000 mg | Freq: Once | INTRAMUSCULAR | Status: AC
  Administered 2020-05-24: 10 mg via INTRAVENOUS
  Filled 2020-05-24: qty 1

## 2020-05-24 NOTE — Progress Notes (Signed)
This chaplain responded to the RN-Veronica page.  The chaplain informed the RN only supports the completion of HCPOA. Durable POA is completed outside the hospital. The Pt. mother-Donna Short expressed an  interested in [name] education.  The chaplain learned from Donna Short  the Pt. does not have decisional capacity at this time.  The chaplain educated Donna Short on the topic of HCPOA.  Donna Short requested F/U in a few days.  This chaplain will make a referral to the unit chaplain for F/U spiritual care.

## 2020-05-24 NOTE — Plan of Care (Signed)

## 2020-05-24 NOTE — Progress Notes (Signed)
eLink Physician-Brief Progress Note Patient Name: Donna Short DOB: [birth year] MRN: [id number]   Date of Service  05/24/2020  HPI/Events of Note  Patient needs restraints order renewed.  eICU Interventions  Restraints order entered.        Thomasene Lot Shaneeka Scarboro 05/24/2020, 2:16 AM

## 2020-05-24 NOTE — Progress Notes (Addendum)
Subjective: Patient was extubated yesterday but then had to be reintubated because of her inability to control her secretions.  ROS: Unable to obtain due to poor mental status  Examination  Vital signs in last 24 hours: Temp:  [98.4 F (36.9 C)-100.5 F (38.1 C)] 98.4 F (36.9 C) (09/30 0729) Pulse Rate:  [95-140] 105 (09/30 0944) Resp:  [18-33] 27 (09/30 0900) BP: (103-170)/(75-122) 127/100 (09/30 0944) SpO2:  [95 %-100 %] 100 % (09/30 0900) FiO2 (%):  [40 %-100 %] 40 % (09/30 0800) Weight:  [55.9 kg] 55.9 kg (09/30 0500)  General: lying in bed,not in apparent distress CVS: pulse-normal rate and rhythm RS: breathing comfortably,intubated Extremities: normal,warm Neuro:Opens eyes spontaneously,not following commands, pupils equally round and reactive,  looking around, spontaneously moving all 4 extremities  Basic Metabolic Panel: Recent Labs  Lab 05/18/20 0540 05/19/20 0421 05/20/20 0527 05/20/20 0527 05/21/20 0522 05/22/20 0422 05/23/20 0508 05/24/20 0551  NA 139  --  138  --  137  --  134* 135  K 4.0  --  4.0  --  3.8  --  3.8 3.8  CL 113*  --  114*  --  112*  --  98 96*  CO2 17*  --  16*  --  17*  --  26 29  GLUCOSE 97  --  145*  --  126*  --  111* 144*  BUN 10  --  15  --  14  --  15 20  CREATININE 0.68  --  0.70  --  0.81  --  0.59 0.62  CALCIUM 7.3*  --  8.4*   < > 8.7*  --  9.1 9.4  MG 1.5*  --  1.6*  --  1.8  --   --   --   PHOS 3.8   < > 5.3*  --  4.0 3.3 3.7 3.9   < > = values in this interval not displayed.    CBC: Recent Labs  Lab 05/18/20 0540 05/19/20 2324 05/21/20 0035 05/23/20 0508 05/24/20 0551  WBC 7.8 5.8 11.7* 13.2* 17.0*  NEUTROABS  --   --   --  8.6* 13.2*  HGB 8.6* 9.2* 9.3* 9.2* 8.6*  HCT 26.4* 28.8* 28.9* 26.6* 25.5*  MCV 107.8* 107.1* 107.8* 101.5* 101.6*  PLT 155 201 270 279 297     Coagulation Studies: No results for input(s): LABPROT, INR in the last 72 hours.  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  Recommendations -Continue Keppra 1500 mg twice daily,Dilantin 75 mg every 8 hours,phenobarb 65 mg twice daily(phenobarb level 26.6 on 05/21/2020).  Phenytoin level ordered and pending -If patient has any breakthrough seizures,will add another antiepileptic like topiramate. -Patient had left-sided seizures and therefore may have aphasia as a residual effect which might improve over time -We will consider repeat EEG tomorrow if patient continues to be aphasic -Continue strict management of blood pressure with goal SBP less than 140 -Updated patient's mother at bedside regarding management. -Continue seizure precautions - Management of rest of comorbidities per primary team  I have spent a total of  25  minutes with the patient reviewing hospital notes,  test results, labs and examining the patient as well as establishing an assessment and  plan that was discussed personally with the patient's team.  > 50% of time was spent in direct patient care.    Lindie Spruce Epilepsy [facility] For questions after 5pm please refer to AMION to reach the Neurologist on call

## 2020-05-24 NOTE — Progress Notes (Signed)
NAME:  Donna Short, MRN:  [id number], DOB:  [birth year], LOS: 13 ADMISSION DATE:  05/10/2020, CONSULTATION DATE:  05/14/20 REFERRING MD:  Loney Loh, CHIEF COMPLAINT:  Encephalopathy   Brief History   Donna Short is a 29 y.o. female with a PMHx of EtOH abuse and HTN admitted to [facility] 9/17 for acute on chronic pancreatitis and EtOH withdrawal. During her hospital stay she developed uncontrolled HTN and breakthrough seizure activity with MRI findings consistent with PRES.   Past Medical History   has a past medical history of Asthma, Depression, History of chicken pox, Hypertension, Migraines, Pancreatitis (01/2018), and Seizures (HCC). Allergies  Allergen Reactions   Claritin [Loratadine] Itching   Peanut-Containing Drug Products Other (See Comments)   Benadryl [Diphenhydramine Hcl (Sleep)] Rash   Latex Rash   Shrimp [Shellfish Allergy] Hives and Rash     Significant Hospital Events   9/16 - admit 9/18, 9/19 breakthrough seizures 9/20- MRI showing PRES 9/21 -Patient had a hypoglycemic episode with BG 61 and shaking overnight consistent with previous episodes past 2 days. Symptoms responded well to 8 oz of juice and BG improved to 72 within 30 minutes. Likely due to poor oral intake intubation for burst suppression  9/22  - 9/22 - intubated yesterday for burst suppressio needs -> post intubation hypothermic/then febrile 102F with hypotension/pressor need. Off cleviprex -> this morning off pressors.  Continued hypoglycemia -> needing more D5. Not yet on TF. Making urine. Neuro Dr Wilford Corner concerned about recurrent PRES (milder in Jan 2021). Wants to consider LP if fever persists and culture negative. Currently on diprivan gtt and MAP at goal - 80-90  9/23 -  9/23  EEG with continued PLED per neuro. Versed increaed from 30mg /h to 40mg /h. MAx diprivan. On keppra On neop. On d5 normal saline. On TF. 40% fio2 on vent. ECHO normal. No DVT on duplex. Afebrile. MAP at 90 at goal. No labs in  results today. AUtoimmune sent yestserday. UDS Sent yesterday. Off cleviprex  9/24 - on 40% FiO2.  On ventilator.  On low-dose Neo-Synephrine to keep the MAP between 80 and 90.  Not needing Cleviprex.  Has increased Versed needs.  Currently on Versed 90 mg/h and propofol 90 mcg.  Autoimmune profile negative except for weak positive ANA.  Has hypoglycemia despite tube feeds.  Started on D10 -infusion  9/25 - remains on 40% oxygen and PEEP of 5.  On the ventilator.  RASS sedation score -5.  Propofol has been stopped because of increased triglycerides.  Continues on Versed infusion ketamine has been started.  Neuro electrophysiologist -EEG showed improved PLEDs and now replaced by intermittent spikes.  Ongoing encephalopathy present.  Overall EEG significantly improved.  Also on phenobarb and Keppra.  Dilantin still on hold. Currently off neo because mean arterial pressure is automatically between 80 and 90  9/27- Ketamine discontinued and Versed down to 5mg /hr  9/29 - Off sedation for 24 hours, extubation trial attempted, mental status not improved, re-intubated  Consults:  Neurology- consulted 9/20, recommended maintaining strict BP control with MAP goals 80-90, Continue Keppra 1500 mg twice daily, Dilantin 75 mg every 8 hours, phenobarb 65 mg twice daily (phenobarb level 26.6 on 05/21/2020)  Procedures:  ETT 9/21>> PICC 9/22   Significant Diagnostic Tests:  9/16 = Abdominal CT- findings consistent with acute on chronic pancreatitis predominantly involving the proximal pancreas. No peripancreatic fluid collections are identified. EEG- 9/20, evidence of epileptogenicity arising from left posterior temporal region as well as cortical dysfunction in  left hemisphere due to underlying structural abnormality, PRES MRI- 9/20, patchy/extensive brain edema in a pattern consistent with PRES 9/22 - echo and duplex - normal 9/26 - CXR stable with lower lung infiltrates L>R  Micro Data:  SARS 9/16 -  neg Trach 9/23 - nl flora Urine 9/22 - neg Blood 9/22 - neg so far MRSa PCR 9/20 -neg Autoimmune and vasculitis panel 9/22 >> ANA weak positive but  Otherwise neg U Tox 9/22 - positive benzo (Versed infusion)  Antimicrobials:  Aztreoniam 9/22 (empiric sepsis)>> 9/22, Zosyn 9/22 >> 05/19/2020  Interim history/subjective:   05/24/2020 - Hypertension and tachycardia much improved,Soft restraints in place.  On NG tube feeds.  She is moving and opening eyes intermittently but not following commands.  Objective   Blood pressure (!) 127/100, pulse (!) 105, temperature 98.4 F (36.9 C), temperature source Axillary, resp. rate (!) 27, height 5\' 2"  (1.575 m), weight 55.9 kg, SpO2 100 %.    Vent Mode: PRVC FiO2 (%):  [40 %-100 %] 40 % Set Rate:  [18 bmp] 18 bmp Vt Set:  [400 mL] 400 mL PEEP:  [5 cmH20] 5 cmH20 Plateau Pressure:  [16 cmH20-20 cmH20] 16 cmH20   Intake/Output Summary (Last 24 hours) at 05/24/2020 1136 Last data filed at 05/24/2020 0900 Gross per 24 hour  Intake 1407.59 ml  Output 250 ml  Net 1157.59 ml   Filed Weights   05/22/20 0439 05/23/20 0500 05/24/20 0500  Weight: 64.2 kg 60.5 kg 55.9 kg   Gen: Critically ill looking young lady.  Mildly arousable on the ventilator. HEENT: Pupils equal, sluggishly reactive to light, moderate amount of secretion from ETT CV: Normal rhythm, S1, S2, Tachycardic, 2+ pulses Radial and DP, Cap refill <2 secs, BLE edema minimal, BUE 1+ edema in hands Pulm: Normal breath sounds, lungs clear at apex/bases  Abdomen: soft, non distended. Tolerating tube feeds. 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 Worse compared to Jan 2021.   P:   Per neuro Continue Keppra 1500 mg twice daily, Dilantin  75 mg every 8 hours, phenobarb 65 mg twice daily (phenobarb level 26.6 on 05/21/2020) HTN control, SBP<140  A:  Acute Resp Failure - s/p intubation for burst suppression 05/14/20  P:   ETT tube in place, continue until mental status improves CXR today   A:   Hypertensive all admission, in setting of known PRES  -Mean arterial pressure above goal of 80 and 90,  MAP around 100-120 overnight  P:  MAP goal 80-90  -Lisinopril 20 mg daily -Metoprolol 100 mg BID -Amlodipine 10 mg daily -HCTZ 25 mg daily  A: Tachycardia - Elevated starting 9/26, not responsive to Albumin, patient is afebrile without signs of sepsis and negative cultures to date  P: Continue Metoprolol and Lisinopril as above  A:   Hypothermic and then febrile 9/21-9/22 with lactic acidosis 3 MRSA PCR negative.  Urine strep negative Lactic acodiss + -resolved 05/17/2020 Off zosyn 9/25  05/24/2020 - afebrile. So far culture negative,.  P:   MR or LP based on course (per neuro)  A:   Pancreatitis (acute on chronic ) - etoh 05/10/2020 - admit based on CT with lipase 101 at admit  P:   TF  A:  anemia critical illness  05/24/2020 - no active bleeding  P:  - PRBC for hgb </= 6.9gm%   - exceptions are  -  if ACS susepcted/confirmed then transfuse for hgb </= 8.0gm%,  or   -  active bleeding with hemodynamic instability, then transfuse regardless of  hemoglobin value  -  At at all times try to transfuse 1 unit prbc as possible with exception of  active hemorrhage   HEMATOLOGIC - Platelets A Transient Thrombocytopenia [Baseline platelets 150-200; thrombocytopenia 80s in [facility] 2021) - 9/20 - 9/24 and resolved.  Duplex negative.  HIT antibody panel normal - 05/16/2020  P Continue lovenox  ENDOCRINE A:   Hypoglycemia  -Ongoing despite tube feeds on 05/18/2020.  Improved 05/19/2020 after adding D10 - 05/24/20 AM BG WNL, one episode of 64 BG yesterday after TF d/c but restarted  P:   TF Restart D10 if  hyopglycemic SSI  MSK/DERM AT risk for Sacral deub-909/25/21 per nursing At risk for rhabdo due to diprivan -high triglycerides and to prevent stopped 05/18/2020.   Plan  - nursing care - monitor CK and lactate as prn  Best practice:  Diet: TF Pain/Anxiety/Delirium protocol (if indicated): fentanyl/dilaudid/versed prn VAP protocol (if indicated): N/a DVT prophylaxis: Lovenox GI prophylaxis: N/a Glucose control: ssi Mobility: bedrest Code Status: Full code Family Communication:  FMLA form done 9/21. Mom Meryl Dare [phone]) updated 9/21 at bedside and over phone 05/16/20 and again 9/23 over phone and 05/19/20. Mom was updated by nursing 05/20/20  Disposition: Continued ICU care   ATTESTATION & SIGNATURE  Olene Craven, MS4  LABS   PULMONARY No results for input(s): PHART, PCO2ART, PO2ART, HCO3, TCO2, O2SAT in the last 168 hours.  Invalid input(s): PCO2, PO2 CBC Recent Labs  Lab 05/21/20 0035 05/23/20 0508 05/24/20 0551  HGB 9.3* 9.2* 8.6*  HCT 28.9* 26.6* 25.5*  WBC 11.7* 13.2* 17.0*  PLT 270 279 297   COAGULATION No results for input(s): INR in the last 168 hours.  CARDIAC  No results for input(s): TROPONINI in the last 168 hours. No results for input(s): PROBNP in the last 168 hours.  CHEMISTRY Recent Labs  Lab 05/18/20 0540 05/19/20 0421 05/20/20 0527 05/20/20 0527 05/21/20 0522 05/21/20 0522 05/22/20 0422 05/23/20 0508 05/24/20 0551  NA 139  --  138  --  137  --   --  134* 135  K 4.0  --  4.0   < > 3.8   < >  --  3.8 3.8  CL 113*  --  114*  --  112*  --   --  98 96*  CO2 17*  --  16*  --  17*  --   --  26 29  GLUCOSE 97  --  145*  --  126*  --   --  111* 144*  BUN 10  --  15  --  14  --   --  15 20  CREATININE 0.68  --  0.70  --  0.81  --   --  0.59 0.62  CALCIUM 7.3*  --  8.4*  --  8.7*  --   --  9.1 9.4  MG 1.5*  --  1.6*  --  1.8  --   --   --   --   PHOS 3.8   < > 5.3*  --  4.0  --  3.3 3.7 3.9   < > = values in this interval not  displayed.   Estimated  Creatinine Clearance: 80.6 mL/min (by C-G formula based on SCr of 0.62 mg/dL).  LIVER Recent Labs  Lab 05/18/20 0540 05/20/20 0527 05/21/20 0522 05/23/20 0508 05/24/20 0551  AST 21 18 30 29  33  ALT 21 16 17 22 24   ALKPHOS 20* 22* 31* 40 45  BILITOT 0.6 0.3 0.3 0.4 0.4  PROT 4.8* 5.1* 6.0* 6.1* 6.2*  ALBUMIN 1.8* 1.8* 2.4* 2.1* 2.3*    INFECTIOUS Recent Labs  Lab 05/18/20 0540 05/20/20 0527  LATICACIDVEN 1.0 0.8    ENDOCRINE CBG (last 3)  Recent Labs    05/23/20 2310 05/24/20 0318 05/24/20 0727  GLUCAP 108* 132* 154*    IMAGING x48h  - image(s) personally visualized  -   highlighted in bold DG CHEST PORT 1 VIEW  Result Date: 05/24/2020 CLINICAL DATA:  Respiratory failure. EXAM: PORTABLE CHEST 1 VIEW COMPARISON:  05/21/2020 FINDINGS: Endotracheal tube is approximately 8 mm above the carina. Feeding tube extends into the abdomen but the tip is beyond the image. Persistent opacification in the retrocardiac space. Upper lungs remain clear. Few hazy densities in right lower lung may represent atelectasis. Heart size is within normal limits and stable. Negative for pneumothorax. Right arm PICC line tip appears to be in the SVC region. IMPRESSION: 1. Persistent densities at the left lung base with obscuration of the left hemidiaphragm. Findings are suggestive for consolidation or collapse in the left lower lobe. 2. Slightly improved aeration at the right lung base with few residual right basilar lung densities. 3. Support apparatuses as described. Endotracheal tube is positioned just above the carina. Electronically Signed   By: Richarda OverlieAdam  Henn M.D.   On: 05/24/2020 11:06   05/24/2020, 11:36 AM   Critical care attending attestation note:  Patient seen and examined and relevant ancillary tests reviewed.  I agree with the assessment and plan of care as outlined by Belinda FisherP Taylor, MS IV.   Synopsis of assessment and plan:  29 year old woman who is critically ill  due to status epilepticus from PRES requiring titration of sedation to prevent seizures and mechanical ventilation for airway protection.  She failed a trial of extubation yesterday due to somnolence and poor secretion management.   On reintubation, there was significant cord edema.   Today on examination she is more active in bed but still not following commands. Facial edema is much improved. Still copious oral secretions but chest clear. HS normal and extremities warm. No focal neurological deficits.   Remains critically ill due to respiratory failure requiring mechanical ventilation.   Will plan to try extubation again tomorrow after 48h of steroids and period on dexmedetomidine to allow washout of other sedatives.   CRITICAL CARE Performed by: Lynnell Catalanavi Tramell Piechota   Total critical care time: 35 minutes  Critical care time was exclusive of separately billable procedures and treating other patients.  Critical care was necessary to treat or prevent imminent or life-threatening deterioration.  Critical care was time spent personally by me on the following activities: development of treatment plan with patient and/or surrogate as well as nursing, discussions with consultants, evaluation of patient's response to treatment, examination of patient, obtaining history from patient or surrogate, ordering and performing treatments and interventions, ordering and review of laboratory studies, ordering and review of radiographic studies, pulse oximetry, re-evaluation of patient's condition and participation in multidisciplinary rounds.  Lynnell Catalanavi Lehi Phifer, MD [facility] ICU Physician [facility]  Pager: [phone] Mobile: [phone] After hours: [phone].  05/24/2020, 6:46 PM

## 2020-05-25 ENCOUNTER — Inpatient Hospital Stay (HOSPITAL_COMMUNITY): Payer: Managed Care, Other (non HMO)

## 2020-05-25 DIAGNOSIS — J384 Edema of larynx: Secondary | ICD-10-CM

## 2020-05-25 LAB — GLUCOSE, CAPILLARY
Glucose-Capillary: 137 mg/dL — ABNORMAL HIGH (ref 70–99)
Glucose-Capillary: 158 mg/dL — ABNORMAL HIGH (ref 70–99)
Glucose-Capillary: 129 mg/dL — ABNORMAL HIGH (ref 70–99)
Glucose-Capillary: 80 mg/dL (ref 70–99)
Glucose-Capillary: 83 mg/dL (ref 70–99)
Glucose-Capillary: 121 mg/dL — ABNORMAL HIGH (ref 70–99)

## 2020-05-25 LAB — CBC WITH DIFFERENTIAL/PLATELET
Abs Immature Granulocytes: 0.2 10*3/uL — ABNORMAL HIGH (ref 0.00–0.07)
Basophils Absolute: 0.1 10*3/uL (ref 0.0–0.1)
Basophils Relative: 1 %
Eosinophils Absolute: 0.1 10*3/uL (ref 0.0–0.5)
Eosinophils Relative: 1 %
HCT: 26.8 % — ABNORMAL LOW (ref 36.0–46.0)
Hemoglobin: 9.1 g/dL — ABNORMAL LOW (ref 12.0–15.0)
Immature Granulocytes: 2 %
Lymphocytes Relative: 10 %
Lymphs Abs: 1.3 10*3/uL (ref 0.7–4.0)
MCH: 34 pg (ref 26.0–34.0)
MCHC: 34 g/dL (ref 30.0–36.0)
MCV: 100 fL (ref 80.0–100.0)
Monocytes Absolute: 1.2 10*3/uL — ABNORMAL HIGH (ref 0.1–1.0)
Monocytes Relative: 10 %
Neutro Abs: 9.6 10*3/uL — ABNORMAL HIGH (ref 1.7–7.7)
Neutrophils Relative %: 76 %
Platelets: 346 10*3/uL (ref 150–400)
RBC: 2.68 MIL/uL — ABNORMAL LOW (ref 3.87–5.11)
RDW: 11 % — ABNORMAL LOW (ref 11.5–15.5)
WBC: 12.4 10*3/uL — ABNORMAL HIGH (ref 4.0–10.5)
nRBC: 0 % (ref 0.0–0.2)

## 2020-05-25 LAB — TRIGLYCERIDES: Triglycerides: 205 mg/dL — ABNORMAL HIGH (ref <150)

## 2020-05-25 LAB — COMPREHENSIVE METABOLIC PANEL
ALT: 27 U/L (ref 0–44)
AST: 35 U/L (ref 15–41)
Albumin: 2.6 g/dL — ABNORMAL LOW (ref 3.5–5.0)
Alkaline Phosphatase: 47 U/L (ref 38–126)
Anion gap: 13 (ref 5–15)
BUN: 15 mg/dL (ref 6–20)
CO2: 30 mmol/L (ref 22–32)
Calcium: 9.6 mg/dL (ref 8.9–10.3)
Chloride: 91 mmol/L — ABNORMAL LOW (ref 98–111)
Creatinine, Ser: 0.54 mg/dL (ref 0.44–1.00)
GFR calc Af Amer: >60 mL/min (ref >60)
GFR calc non Af Amer: >60 mL/min (ref >60)
Glucose, Bld: 149 mg/dL — ABNORMAL HIGH (ref 70–99)
Potassium: 4 mmol/L (ref 3.5–5.1)
Sodium: 134 mmol/L — ABNORMAL LOW (ref 135–145)
Total Bilirubin: 0.4 mg/dL (ref 0.3–1.2)
Total Protein: 7.2 g/dL (ref 6.5–8.1)

## 2020-05-25 LAB — PHENYTOIN LEVEL, TOTAL: Phenytoin Lvl: 8.7 ug/mL — ABNORMAL LOW (ref 10.0–20.0)

## 2020-05-25 MED ORDER — MIDAZOLAM HCL 2 MG/2ML IJ SOLN
INTRAMUSCULAR | Status: AC
  Administered 2020-05-25: 2 mg
  Filled 2020-05-25: qty 2

## 2020-05-25 MED ORDER — ETOMIDATE 2 MG/ML IV SOLN: 40.0000 mg | Freq: Once | INTRAVENOUS | Status: AC

## 2020-05-25 MED ORDER — DEXAMETHASONE SODIUM PHOSPHATE 4 MG/ML IJ SOLN
4.0000 mg | INTRAMUSCULAR | Status: AC
  Administered 2020-05-26 – 2020-05-28 (×3): 4 mg via INTRAVENOUS
  Filled 2020-05-25 (×3): qty 1

## 2020-05-25 MED ORDER — SUCCINYLCHOLINE CHLORIDE 20 MG/ML IJ SOLN
100.0000 mg | Freq: Once | INTRAMUSCULAR | Status: AC
  Administered 2020-05-25: 100 mg via INTRAVENOUS
  Filled 2020-05-25: qty 5

## 2020-05-25 MED ORDER — ETOMIDATE 2 MG/ML IV SOLN
INTRAVENOUS | Status: AC
  Administered 2020-05-25: 40 mg
  Filled 2020-05-25: qty 10

## 2020-05-25 MED ORDER — FENTANYL CITRATE (PF) 100 MCG/2ML IJ SOLN
INTRAMUSCULAR | Status: AC
  Filled 2020-05-25: qty 2

## 2020-05-25 MED ORDER — ALBUTEROL SULFATE (2.5 MG/3ML) 0.083% IN NEBU: 2.5000 mg | INHALATION_SOLUTION | Freq: Four times a day (QID) | RESPIRATORY_TRACT | Status: DC | PRN

## 2020-05-25 MED ORDER — MIDAZOLAM HCL 2 MG/2ML IJ SOLN: 2.0000 mg | Freq: Once | INTRAMUSCULAR | Status: AC

## 2020-05-25 MED ORDER — MIDAZOLAM HCL 2 MG/2ML IJ SOLN
INTRAMUSCULAR | Status: AC
  Filled 2020-05-25: qty 2

## 2020-05-25 MED ORDER — AMLODIPINE BESYLATE 10 MG PO TABS
10.0000 mg | ORAL_TABLET | Freq: Every day | ORAL | Status: DC
  Administered 2020-05-25 – 2020-05-28 (×4): 10 mg
  Filled 2020-05-25 (×4): qty 1

## 2020-05-25 NOTE — Procedures (Signed)
Intubation Procedure Note  Donna Short  [id number]  [birth year]  Date:05/25/20  Time:4:47 PM   Provider Performing:Morley Gaumer B Talaysha Freeberg    Procedure: Intubation (31500)  Indication(s) Respiratory Failure  Consent Unable to obtain consent due to emergent nature of procedure.   Anesthesia Etomidate, Versed and Succinylcholine   Time Out Verified patient identification, verified procedure, site/side was marked, verified correct patient position, special equipment/implants available, medications/allergies/relevant history reviewed, required imaging and test results available.   Sterile Technique Usual hand hygeine, masks, and gloves were used   Procedure Description Patient positioned in bed supine.  Sedation given as noted above.  Patient was intubated with endotracheal tube using Glidescope.  View was Grade 1 full glottis .  Number of attempts was 1.  Colorimetric CO2 detector was consistent with tracheal placement.   Complications/Tolerance None; patient tolerated the procedure well. Chest X-ray is ordered to verify placement.   EBL Minimal   Specimen(s) None

## 2020-05-25 NOTE — Progress Notes (Signed)
Subjective: No new acute events overnight.  Patient able to follow commands this morning.  ROS: Unable to obtain due to poor mental status  Examination  Vital signs in last 24 hours: Temp:  [98 F (36.7 C)-99.7 F (37.6 C)] 99.3 F (37.4 C) (10/01 1113) Pulse Rate:  [82-122] 93 (10/01 1040) Resp:  [14-29] 18 (10/01 0838) BP: (106-191)/(93-123) 128/98 (10/01 1040) SpO2:  [90 %-100 %] 90 % (10/01 0838) FiO2 (%):  [40 %] 40 % (10/01 0838) Weight:  [61 kg] 61 kg (10/01 0500)  General: lying in bed,not in apparent distress CVS: pulse-normal rate and rhythm RS: breathing comfortably,intubated Extremities: normal,warm Neuro:Opens eyes spontaneously,following simple one-step commands like closing her eyes, moving around, wiggling her toes, pupils equally round and reactive, looking around, spontaneously moving all 4 extremities  Basic Metabolic Panel: Recent Labs  Lab 05/20/20 0527 05/20/20 0527 05/21/20 0522 05/21/20 0522 05/22/20 0422 05/23/20 0508 05/24/20 0551 05/25/20 0500  NA 138  --  137  --   --  134* 135 134*  K 4.0  --  3.8  --   --  3.8 3.8 4.0  CL 114*  --  112*  --   --  98 96* 91*  CO2 16*  --  17*  --   --  26 29 30   GLUCOSE 145*  --  126*  --   --  111* 144* 149*  BUN 15  --  14  --   --  15 20 15   CREATININE 0.70  --  0.81  --   --  0.59 0.62 0.54  CALCIUM 8.4*   < > 8.7*   < >  --  9.1 9.4 9.6  MG 1.6*  --  1.8  --   --   --   --   --   PHOS 5.3*  --  4.0  --  3.3 3.7 3.9  --    < > = values in this interval not displayed.    CBC: Recent Labs  Lab 05/19/20 2324 05/21/20 0035 05/23/20 0508 05/24/20 0551 05/25/20 0500  WBC 5.8 11.7* 13.2* 17.0* 12.4*  NEUTROABS  --   --  8.6* 13.2* 9.6*  HGB 9.2* 9.3* 9.2* 8.6* 9.1*  HCT 28.8* 28.9* 26.6* 25.5* 26.8*  MCV 107.1* 107.8* 101.5* 101.6* 100.0  PLT 201 270 279 297 346     Coagulation Studies: No results for input(s): LABPROT, INR in the last 72 hours.  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  Now also starting to follow commands this morning.   Recommendations -Continue Keppra 1500 mg twice daily,Dilantin 75 mg every 8 hours,phenobarb 65 mg twice daily(phenobarb level 26.6 on 05/21/2020,  corrected phenytoin level  10.7 on 05/25/2020) -If patient has any breakthrough seizures,will can increase Dilantin -Continue strict management of blood pressure with goal SBP less than 140 -Called and updated patient's mother on phone -Continue seizure precautions - Management of rest of comorbidities per primary team  I have spent a total of 25  minuteswith the patient reviewing hospitalnotes,  test results, labs and examining the patient as well as establishing an assessment and plan that was discussed personally with the patient's team and patient's mother.>50% of time was spent in  direct patient care.   Lindie Spruce Epilepsy [facility] For questions after 5pm please refer to AMION to reach the Neurologist on call

## 2020-05-25 NOTE — Progress Notes (Signed)
eLink Physician-Brief Progress Note Patient Name: ALAIAH LUNDY DOB: 1990/11/05 MRN: [id number]   Date of Service  05/25/2020  HPI/Events of Note  BP 184/124, patient did not respond to PRN BP control medications.  eICU Interventions  Bedside RN given go ahead to start Cleviprex which was already on the [facility].        Sirinity Outland U Madalyne Husk 05/25/2020, 12:08 AM

## 2020-05-25 NOTE — Progress Notes (Signed)
eLink Physician-Brief Progress Note Patient Name: Donna Short DOB: 1991/04/15 MRN: [id number]   Date of Service  05/25/2020  HPI/Events of Note  Patient needs restraints orders renewed.  eICU Interventions  Restraints orders renewed.        Donna Short Donna Short 05/25/2020, 1:14 AM

## 2020-05-25 NOTE — Progress Notes (Signed)
Notified Dr. Francine Graven and RT patient's respiratory status has declined following earlier extubation and patient increasingly lethargic. Provider and RT to come to bedside to assess patient.

## 2020-05-25 NOTE — Plan of Care (Signed)

## 2020-05-25 NOTE — Progress Notes (Signed)
NAME:  CYLIE DOR, MRN:  [id number], DOB:  11-04-1990, LOS: 14 ADMISSION DATE:  05/10/2020, CONSULTATION DATE:  05/14/20 REFERRING MD:  Loney Loh, CHIEF COMPLAINT:  Encephalopathy   Brief History   MERRIL ISAKSON is a 29 y.o. female with a PMHx of EtOH abuse and HTN admitted to [facility] 9/17 for acute on chronic pancreatitis and EtOH withdrawal. During her hospital stay she developed uncontrolled HTN and breakthrough seizure activity with MRI findings consistent with PRES.   Past Medical History   has a past medical history of Asthma, Depression, History of chicken pox, Hypertension, Migraines, Pancreatitis (01/2018), and Seizures (HCC). Allergies  Allergen Reactions  . Claritin [Loratadine] Itching  . Peanut-Containing Drug Products Other (See Comments)  . Benadryl [Diphenhydramine Hcl (Sleep)] Rash  . Latex Rash  . Shrimp [Shellfish Allergy] Hives and Rash     Significant Hospital Events   9/16 - admit 9/18, 9/19 breakthrough seizures 9/20- MRI showing PRES 9/21 -Patient had a hypoglycemic episode with BG 61 and shaking overnight consistent with previous episodes past 2 days. Symptoms responded well to 8 oz of juice and BG improved to 72 within 30 minutes. Likely due to poor oral intake intubation for burst suppression  9/22  - 9/22 - intubated yesterday for burst suppressio needs -> post intubation hypothermic/then febrile 102F with hypotension/pressor need. Off cleviprex -> this morning off pressors.  Continued hypoglycemia -> needing more D5. Not yet on TF. Making urine. Neuro Dr Wilford Corner concerned about recurrent PRES (milder in Jan 2021). Wants to consider LP if fever persists and culture negative. Currently on diprivan gtt and MAP at goal - 80-90  9/23 -  9/23  EEG with continued PLED per neuro. Versed increaed from 30mg /h to 40mg /h. MAx diprivan. On keppra On neop. On d5 normal saline. On TF. 40% fio2 on vent. ECHO normal. No DVT on duplex. Afebrile. MAP at 90 at goal. No labs in  results today. AUtoimmune sent yestserday. UDS Sent yesterday. Off cleviprex  9/24 - on 40% FiO2.  On ventilator.  On low-dose Neo-Synephrine to keep the MAP between 80 and 90.  Not needing Cleviprex.  Has increased Versed needs.  Currently on Versed 90 mg/h and propofol 90 mcg.  Autoimmune profile negative except for weak positive ANA.  Has hypoglycemia despite tube feeds.  Started on D10 -infusion  9/25 - remains on 40% oxygen and PEEP of 5.  On the ventilator.  RASS sedation score -5.  Propofol has been stopped because of increased triglycerides.  Continues on Versed infusion ketamine has been started.  Neuro electrophysiologist -EEG showed improved PLEDs and now replaced by intermittent spikes.  Ongoing encephalopathy present.  Overall EEG significantly improved.  Also on phenobarb and Keppra.  Dilantin still on hold. Currently off neo because mean arterial pressure is automatically between 80 and 90  9/27- Ketamine discontinued and Versed down to 5mg /hr  9/29 - Off sedation for 24 hours, extubation trial attempted, mental status not improved, re-intubated  Consults:  Neurology- consulted 9/20, recommended maintaining strict BP control with MAP goals 80-90, Continue Keppra 1500 mg twice daily, Dilantin 75 mg every 8 hours, phenobarb 65 mg twice daily (phenobarb level 26.6 on 05/21/2020)  Procedures:  ETT 9/21>> PICC 9/22   Significant Diagnostic Tests:  9/16 = Abdominal CT- findings consistent with acute on chronic pancreatitis predominantly involving the proximal pancreas. No peripancreatic fluid collections are identified. EEG- 9/20, evidence of epileptogenicity arising from left posterior temporal region as well as cortical dysfunction in  left hemisphere due to underlying structural abnormality, PRES MRI- 9/20, patchy/extensive brain edema in a pattern consistent with PRES 9/22 - echo and duplex - normal 9/26 - CXR stable with lower lung infiltrates L>R  Micro Data:  SARS 9/16 -  neg Trach 9/23 - nl flora Urine 9/22 - neg Blood 9/22 - neg so far MRSa PCR 9/20 -neg Autoimmune and vasculitis panel 9/22 >> ANA weak positive but  Otherwise neg U Tox 9/22 - positive benzo (Versed infusion)  Antimicrobials:  Aztreoniam 9/22 (empiric sepsis)>> 9/22, Zosyn 9/22 >> 05/19/2020  Interim history/subjective:   05/25/2020 - Hypertension and tachycardia much improved,Soft restraints in place.  On NG tube feeds.  She is moving and opening eyes intermittently but not following commands.  Objective   Blood pressure (!) 121/99, pulse 88, temperature 98 F (36.7 C), temperature source Axillary, resp. rate 18, height 5\' 2"  (1.575 m), weight 61 kg, SpO2 90 %.    Vent Mode: CPAP;PSV FiO2 (%):  [40 %] 40 % Set Rate:  [18 bmp] 18 bmp Vt Set:  [400 mL] 400 mL PEEP:  [5 cmH20] 5 cmH20 Pressure Support:  [8 cmH20] 8 cmH20 Plateau Pressure:  [18 cmH20-19 cmH20] 19 cmH20   Intake/Output Summary (Last 24 hours) at 05/25/2020 1000 Last data filed at 05/25/2020 0600 Gross per 24 hour  Intake 1600.64 ml  Output 1700 ml  Net -99.36 ml   Filed Weights   05/23/20 0500 05/24/20 0500 05/25/20 0500  Weight: 60.5 kg 55.9 kg 61 kg   Gen: Critically ill looking young lady.  Mildly arousable on the ventilator. HEENT: Pupils equal, sluggishly reactive to light, moderate amount of secretion from ETT CV: Normal rhythm, S1, S2, Tachycardic, 2+ pulses Radial and DP, Cap refill <2 secs, BLE edema minimal, BUE 1+ edema in hands Pulm: Normal breath sounds, lungs clear at apex/bases  Abdomen: soft, non distended. Tolerating tube feeds. MSK/Derm: No rashes on extremities   Resolved Hospital Problem list   AKI- initially presented with Cr 1.4, baseline .7-.9, normalized following administration of IV fluids    Assessment & Plan:   A:   PRES recurrence with seizures 9/19., Confirmed on MRI 9/20 . Worse compared to Jan 2021.   P:   Per neuro Continue Keppra 1500 mg twice daily, Dilantin 75 mg  every 8 hours, phenobarb 65 mg twice daily (phenobarb level 26.6 on 05/21/2020) HTN control, SBP<140  A:  Acute Resp Failure - s/p intubation for burst suppression 05/14/20 Swollen cords noted on reintubation 9/30, started on Dexamethasone 10mg  once then 4mg  q6h x4.Mental status much improved, patient moving feet, grasping hands and nodding head to commands/questions, oral secretions mildly decreased   P:   -Extubate today -Finish decadron -D/c precedex   A:   Hypertensive all admission, in setting of known PRES  -Mean arterial pressure above goal of 80 and 90,  MAP around 100-120 overnight  P:  MAP goal 80-90  -Lisinopril 20 mg daily -Metoprolol 100 mg BID -Amlodipine 10 mg daily (lost on MAR til today, restarted) -D/c hctz  A: Tachycardia - Elevated starting 9/26, not responsive to Albumin, patient is afebrile without signs of sepsis and negative cultures to date  P: Continue Metoprolol and Lisinopril as above  A:   Hypothermic and then febrile 9/21-9/22 with lactic acidosis 3 MRSA PCR negative.  Urine strep negative Lactic acodiss + -resolved 05/17/2020 Off zosyn 9/25  05/25/2020 - afebrile. So far culture negative,.  P:   MR or LP based on course (  per neuro)  A:   Pancreatitis (acute on chronic ) - etoh 05/10/2020 - admit based on CT with lipase 101 at admit Last BM 9/28  P:   TF  Bowel regimen - miralax   A:  anemia critical illness  05/25/2020 - no active bleeding  P:  - PRBC for hgb </= 6.9gm%   - exceptions are  -  if ACS susepcted/confirmed then transfuse for hgb </= 8.0gm%,  or   -  active bleeding with hemodynamic instability, then transfuse regardless of  hemoglobin value  -  At at all times try to transfuse 1 unit prbc as possible with exception of  active hemorrhage   HEMATOLOGIC - Platelets A Transient Thrombocytopenia [Baseline platelets 150-200; thrombocytopenia 80s in [facility] 2021) - 9/20 - 9/24 and resolved.  Duplex negative.  HIT antibody  panel normal - 05/16/2020  P Continue lovenox  ENDOCRINE A:   Hypoglycemia  -Ongoing despite tube feeds on 05/18/2020.  Improved 05/19/2020 after adding D10 - 05/24/20 AM BG WNL  P:   TF SSI  MSK/DERM AT risk for Sacral deub-909/25/21 per nursing At risk for rhabdo due to diprivan -high triglycerides and to prevent stopped 05/18/2020.   Plan  - nursing care - monitor CK and lactate as prn  Best practice:  Diet: TF Pain/Anxiety/Delirium protocol (if indicated): fentanyl/dilaudid/versed prn VAP protocol (if indicated): N/a DVT prophylaxis: Lovenox GI prophylaxis: N/a Glucose control: ssi Mobility: bedrest Code Status: Full code Family Communication:  FMLA form done 9/21. Mom Meryl Dareammie Murray [phone]) updated 9/21 at bedside and over phone 05/16/20 and again 9/23 over phone and 05/19/20. Mom was updated by nursing 05/20/20  Disposition: Continued ICU care  LABS   PULMONARY No results for input(s): PHART, PCO2ART, PO2ART, HCO3, TCO2, O2SAT in the last 168 hours.  Invalid input(s): PCO2, PO2 CBC Recent Labs  Lab 05/23/20 0508 05/24/20 0551 05/25/20 0500  HGB 9.2* 8.6* 9.1*  HCT 26.6* 25.5* 26.8*  WBC 13.2* 17.0* 12.4*  PLT 279 297 346   COAGULATION No results for input(s): INR in the last 168 hours.  CARDIAC  No results for input(s): TROPONINI in the last 168 hours. No results for input(s): PROBNP in the last 168 hours.  CHEMISTRY Recent Labs  Lab 05/20/20 0527 05/20/20 0527 05/21/20 0522 05/21/20 0522 05/22/20 0422 05/23/20 0508 05/23/20 0508 05/24/20 0551 05/25/20 0500  NA 138  --  137  --   --  134*  --  135 134*  K 4.0   < > 3.8   < >  --  3.8   < > 3.8 4.0  CL 114*  --  112*  --   --  98  --  96* 91*  CO2 16*  --  17*  --   --  26  --  29 30  GLUCOSE 145*  --  126*  --   --  111*  --  144* 149*  BUN 15  --  14  --   --  15  --  20 15  CREATININE 0.70  --  0.81  --   --  0.59  --  0.62 0.54  CALCIUM 8.4*  --  8.7*  --   --  9.1  --  9.4 9.6  MG  1.6*  --  1.8  --   --   --   --   --   --   PHOS 5.3*  --  4.0  --  3.3 3.7  --  3.9  --    < > = values in this interval not displayed.   Estimated Creatinine Clearance: 87.7 mL/min (by C-G formula based on SCr of 0.54 mg/dL).  LIVER Recent Labs  Lab 05/20/20 0527 05/21/20 0522 05/23/20 0508 05/24/20 0551 05/25/20 0500  AST 18 30 29  33 35  ALT 16 17 22 24 27   ALKPHOS 22* 31* 40 45 47  BILITOT 0.3 0.3 0.4 0.4 0.4  PROT 5.1* 6.0* 6.1* 6.2* 7.2  ALBUMIN 1.8* 2.4* 2.1* 2.3* 2.6*    INFECTIOUS Recent Labs  Lab 05/20/20 0527  LATICACIDVEN 0.8    ENDOCRINE CBG (last 3)  Recent Labs    05/24/20 2310 05/25/20 0316 05/25/20 0725  GLUCAP 150* 137* 158*    IMAGING x48h  - image(s) personally visualized  -   highlighted in bold DG CHEST PORT 1 VIEW  Result Date: 05/24/2020 CLINICAL DATA:  Respiratory failure. EXAM: PORTABLE CHEST 1 VIEW COMPARISON:  05/21/2020 FINDINGS: Endotracheal tube is approximately 8 mm above the carina. Feeding tube extends into the abdomen but the tip is beyond the image. Persistent opacification in the retrocardiac space. Upper lungs remain clear. Few hazy densities in right lower lung may represent atelectasis. Heart size is within normal limits and stable. Negative for pneumothorax. Right arm PICC line tip appears to be in the SVC region. IMPRESSION: 1. Persistent densities at the left lung base with obscuration of the left hemidiaphragm. Findings are suggestive for consolidation or collapse in the left lower lobe. 2. Slightly improved aeration at the right lung base with few residual right basilar lung densities. 3. Support apparatuses as described. Endotracheal tube is positioned just above the carina. Electronically Signed   By: 05/26/2020 M.D.   On: 05/24/2020 11:06   05/25/2020, 10:00 AM  05/26/2020, MS4 ___________________________________________________________________  Attending Attestation:    Subjective: No acute events overnight.  Patient was extubated this morning as she was more awake and alert following commands. She developed respiratory distress a couple hours after extubation with upper airway stridor. Patient was re-intubated with 7.5 ET tube with notable vocal cord edema.   Objective: Vitals:   05/25/20 0838 05/25/20 1040 05/25/20 1113 05/25/20 1315  BP:  (!) 128/98  (!) 156/107  Pulse: 88 93  (!) 112  Resp: 18   (!) 22  Temp:   99.3 F (37.4 C)   TempSrc:   Oral   SpO2: 90%   100%  Weight:      Height:       Vent Mode: PRVC FiO2 (%):  [40 %-100 %] 100 % Set Rate:  [18 bmp] 18 bmp Vt Set:  [400 mL] 400 mL PEEP:  [5 cmH20] 5 cmH20 Pressure Support:  [8 cmH20] 8 cmH20 Plateau Pressure:  [18 cmH20-25 cmH20] 25 cmH20  Intake/Output Summary (Last 24 hours) at 05/25/2020 1332 Last data filed at 05/25/2020 0600 Gross per 24 hour  Intake 1375.16 ml  Output 1450 ml  Net -74.84 ml    General: respiratory distress, audible wheeze HEENT: Sclera anicteric, moist mucous membranes, stridor Neuro: Somnolent CV: tachycardic, s1s2, no murmur PULM: course breath sounds GI: soft, non-tender, non-distended, bowel sounds present Extremities: warm, dry. No edema Skin: No rashes   CBC    Component Value Date/Time   WBC 12.4 (H) 05/25/2020 0500   RBC 2.68 (L) 05/25/2020 0500   HGB 9.1 (L) 05/25/2020 0500   HCT 26.8 (L) 05/25/2020 0500   PLT 346 05/25/2020 0500   MCV 100.0 05/25/2020 0500  MCH 34.0 05/25/2020 0500   MCHC 34.0 05/25/2020 0500   RDW 11.0 (L) 05/25/2020 0500   LYMPHSABS 1.3 05/25/2020 0500   MONOABS 1.2 (H) 05/25/2020 0500   EOSABS 0.1 05/25/2020 0500   BASOSABS 0.1 05/25/2020 0500    BMET    Component Value Date/Time   NA 134 (L) 05/25/2020 0500   K 4.0 05/25/2020 0500   CL 91 (L) 05/25/2020 0500   CO2 30 05/25/2020 0500   GLUCOSE 149 (H) 05/25/2020 0500   BUN 15 05/25/2020 0500   CREATININE 0.54 05/25/2020 0500   CALCIUM 9.6 05/25/2020 0500   GFRNONAA >60 05/25/2020 0500    GFRAA >60 05/25/2020 0500    Impression/Plan: I agree with the impression and plan as outlined by Olene Craven, MS4 with emphasis on the following:  Naila Elizondo is a 29 year old woman with ETOH abuse and hypertension who remains critically ill secondary to chronic pancreatitis, ETOH withdrawal, hypertension and PRES. Her mental status has improved significantly but she developed respiratory failure secondary to vocal cord edema. She was previously given decadron for this when extubated on 9/30. We will give more decadron and wait to extubate over the weekend potentially.   My cc time 40 minutes  Melody Comas, MD [place] [facility] Office: [phone]   See Amion for Pager Details

## 2020-05-25 NOTE — Procedures (Signed)
Extubation Procedure Note  Patient Details:   Name: Donna Short DOB: 1991/03/05 MRN: [id number]   Airway Documentation:    Vent end date: 05/25/20 Vent end time: 1205   Evaluation  O2 sats: stable throughout Complications: No apparent complications Patient did tolerate procedure well. Bilateral Breath Sounds: Diminished, Rhonchi   Yes.  Pt extubated per physician order. Pt with positive cuff leak and suctioned via ETT/orally prior to extubation. Upon extubation pt placed on 3L nasal cannula. Pt able to speak name, give a good cough and no stridor heard at this time. RT will continue to monitor.   Trilby Leaver Karianna Gusman 05/25/2020, 12:09 PM

## 2020-05-26 LAB — GLUCOSE, CAPILLARY
Glucose-Capillary: 100 mg/dL — ABNORMAL HIGH (ref 70–99)
Glucose-Capillary: 105 mg/dL — ABNORMAL HIGH (ref 70–99)
Glucose-Capillary: 123 mg/dL — ABNORMAL HIGH (ref 70–99)
Glucose-Capillary: 126 mg/dL — ABNORMAL HIGH (ref 70–99)
Glucose-Capillary: 97 mg/dL (ref 70–99)
Glucose-Capillary: 99 mg/dL (ref 70–99)

## 2020-05-26 LAB — BASIC METABOLIC PANEL
Anion gap: 12 (ref 5–15)
BUN: 20 mg/dL (ref 6–20)
CO2: 30 mmol/L (ref 22–32)
Calcium: 9.6 mg/dL (ref 8.9–10.3)
Chloride: 92 mmol/L — ABNORMAL LOW (ref 98–111)
Creatinine, Ser: 0.61 mg/dL (ref 0.44–1.00)
GFR calc Af Amer: >60 mL/min (ref >60)
GFR calc non Af Amer: >60 mL/min (ref >60)
Glucose, Bld: 118 mg/dL — ABNORMAL HIGH (ref 70–99)
Potassium: 3.9 mmol/L (ref 3.5–5.1)
Sodium: 134 mmol/L — ABNORMAL LOW (ref 135–145)

## 2020-05-26 LAB — CBC WITH DIFFERENTIAL/PLATELET
Abs Immature Granulocytes: 0.12 10*3/uL — ABNORMAL HIGH (ref 0.00–0.07)
Basophils Absolute: 0.1 10*3/uL (ref 0.0–0.1)
Basophils Relative: 1 %
Eosinophils Absolute: 0.2 10*3/uL (ref 0.0–0.5)
Eosinophils Relative: 2 %
HCT: 26.6 % — ABNORMAL LOW (ref 36.0–46.0)
Hemoglobin: 8.7 g/dL — ABNORMAL LOW (ref 12.0–15.0)
Immature Granulocytes: 1 %
Lymphocytes Relative: 12 %
Lymphs Abs: 1.3 10*3/uL (ref 0.7–4.0)
MCH: 33.6 pg (ref 26.0–34.0)
MCHC: 32.7 g/dL (ref 30.0–36.0)
MCV: 102.7 fL — ABNORMAL HIGH (ref 80.0–100.0)
Monocytes Absolute: 1.2 10*3/uL — ABNORMAL HIGH (ref 0.1–1.0)
Monocytes Relative: 11 %
Neutro Abs: 7.7 10*3/uL (ref 1.7–7.7)
Neutrophils Relative %: 73 %
Platelets: 347 10*3/uL (ref 150–400)
RBC: 2.59 MIL/uL — ABNORMAL LOW (ref 3.87–5.11)
RDW: 11.3 % — ABNORMAL LOW (ref 11.5–15.5)
WBC: 10.5 10*3/uL (ref 4.0–10.5)
nRBC: 0 % (ref 0.0–0.2)

## 2020-05-26 MED ORDER — LACTATED RINGERS IV BOLUS
1000.0000 mL | Freq: Once | INTRAVENOUS | Status: AC
  Administered 2020-05-26: 1000 mL via INTRAVENOUS

## 2020-05-26 MED ORDER — CHLORHEXIDINE GLUCONATE 0.12 % MT SOLN
OROMUCOSAL | Status: AC
  Administered 2020-05-26: 15 mL via OROMUCOSAL
  Filled 2020-05-26: qty 15

## 2020-05-26 MED ORDER — FENTANYL CITRATE (PF) 100 MCG/2ML IJ SOLN
50.0000 ug | INTRAMUSCULAR | Status: DC | PRN
  Administered 2020-05-26 – 2020-05-27 (×4): 50 ug via INTRAVENOUS
  Filled 2020-05-26 (×4): qty 2

## 2020-05-26 NOTE — Progress Notes (Signed)
NAME:  Donna Short, MRN:  [id number], DOB:  07-Dec-1990, LOS: 15 ADMISSION DATE:  05/10/2020, CONSULTATION DATE:  05/14/20 REFERRING MD:  Loney Loh, CHIEF COMPLAINT:  Encephalopathy   Brief History   Donna Short is a 29 y.o. female with a PMHx of EtOH abuse and HTN admitted to [facility] 9/17 for acute on chronic pancreatitis and EtOH withdrawal. During her hospital stay she developed uncontrolled HTN and breakthrough seizure activity with MRI findings consistent with PRES.   Past Medical History   has a past medical history of Asthma, Depression, History of chicken pox, Hypertension, Migraines, Pancreatitis (01/2018), and Seizures (HCC). Allergies  Allergen Reactions  . Claritin [Loratadine] Itching  . Peanut-Containing Drug Products Other (See Comments)  . Benadryl [Diphenhydramine Hcl (Sleep)] Rash  . Latex Rash  . Shrimp [Shellfish Allergy] Hives and Rash     Significant Hospital Events   9/16 - admit 9/18, 9/19 breakthrough seizures 9/20- MRI showing PRES 9/21 -Patient had a hypoglycemic episode with BG 61 and shaking overnight consistent with previous episodes past 2 days. Symptoms responded well to 8 oz of juice and BG improved to 72 within 30 minutes. Likely due to poor oral intake intubation for burst suppression  9/22  - 9/22 - intubated yesterday for burst suppressio needs -> post intubation hypothermic/then febrile 102F with hypotension/pressor need. Off cleviprex -> this morning off pressors.  Continued hypoglycemia -> needing more D5. Not yet on TF. Making urine. Neuro Dr Wilford Corner concerned about recurrent PRES (milder in Jan 2021). Wants to consider LP if fever persists and culture negative. Currently on diprivan gtt and MAP at goal - 80-90  9/23 -  9/23  EEG with continued PLED per neuro. Versed increaed from 30mg /h to 40mg /h. MAx diprivan. On keppra On neop. On d5 normal saline. On TF. 40% fio2 on vent. ECHO normal. No DVT on duplex. Afebrile. MAP at 90 at goal. No labs in  results today. AUtoimmune sent yestserday. UDS Sent yesterday. Off cleviprex  9/24 - on 40% FiO2.  On ventilator.  On low-dose Neo-Synephrine to keep the MAP between 80 and 90.  Not needing Cleviprex.  Has increased Versed needs.  Currently on Versed 90 mg/h and propofol 90 mcg.  Autoimmune profile negative except for weak positive ANA.  Has hypoglycemia despite tube feeds.  Started on D10 -infusion  9/25 - remains on 40% oxygen and PEEP of 5.  On the ventilator.  RASS sedation score -5.  Propofol has been stopped because of increased triglycerides.  Continues on Versed infusion ketamine has been started.  Neuro electrophysiologist -EEG showed improved PLEDs and now replaced by intermittent spikes.  Ongoing encephalopathy present.  Overall EEG significantly improved.  Also on phenobarb and Keppra.  Dilantin still on hold. Currently off neo because mean arterial pressure is automatically between 80 and 90  9/27- Ketamine discontinued and Versed down to 5mg /hr  9/29 - Off sedation for 24 hours, extubation trial attempted, mental status not improved, re-intubated  Consults:  Neurology- consulted 9/20, recommended maintaining strict BP control with MAP goals 80-90, Continue Keppra 1500 mg twice daily, Dilantin 75 mg every 8 hours, phenobarb 65 mg twice daily (phenobarb level 26.6 on 05/21/2020)  Procedures:  ETT 9/21>> PICC 9/22   Significant Diagnostic Tests:  9/16 = Abdominal CT- findings consistent with acute on chronic pancreatitis predominantly involving the proximal pancreas. No peripancreatic fluid collections are identified. EEG- 9/20, evidence of epileptogenicity arising from left posterior temporal region as well as cortical dysfunction in  left hemisphere due to underlying structural abnormality, PRES MRI- 9/20, patchy/extensive brain edema in a pattern consistent with PRES 9/22 - echo and duplex - normal 9/26 - CXR stable with lower lung infiltrates L>R  Micro Data:  SARS 9/16 -  neg Trach 9/23 - nl flora Urine 9/22 - neg Blood 9/22 - neg so far MRSa PCR 9/20 -neg Autoimmune and vasculitis panel 9/22 >> ANA weak positive but  Otherwise neg U Tox 9/22 - positive benzo (Versed infusion)  Antimicrobials:  Aztreoniam 9/22 (empiric sepsis)>> 9/22, Zosyn 9/22 >> 05/19/2020  Interim history/subjective:   05/26/2020 - Hypertension and tachycardia much improved,Soft restraints in place.  On NG tube feeds.  She is moving and opening eyes intermittently but not following commands.  Objective   Blood pressure (!) 153/106, pulse 88, temperature 97.9 F (36.6 C), temperature source Axillary, resp. rate (!) 34, height 5\' 2"  (1.575 m), weight 56.1 kg, SpO2 97 %.    Vent Mode: PRVC FiO2 (%):  [40 %-100 %] 40 % Set Rate:  [18 bmp] 18 bmp Vt Set:  [400 mL] 400 mL PEEP:  [5 cmH20] 5 cmH20 Plateau Pressure:  [17 cmH20-25 cmH20] 17 cmH20   Intake/Output Summary (Last 24 hours) at 05/26/2020 1145 Last data filed at 05/26/2020 1100 Gross per 24 hour  Intake 1663.15 ml  Output 250 ml  Net 1413.15 ml   Filed Weights   05/24/20 0500 05/25/20 0500 05/26/20 0500  Weight: 55.9 kg 61 kg 56.1 kg   Gen: sedated intubated HEENT: PERRL, ET Tube in place CV: Normal rhythm, S1, S2, Tachycardic, 2+ pulses Radial and DP, Cap refill <2 secs, BLE edema minimal, BUE 1+ edema in hands Pulm: Normal breath sounds, lungs clear at apex/bases  Abdomen: soft, non distended. Tolerating tube feeds. 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 Worse compared to Jan 2021.   P:   Per neuro Continue Keppra 1500 mg twice daily, Dilantin 75 mg every 8 hours, phenobarb 65 mg twice daily (phenobarb level 26.6 on 05/21/2020) HTN control, SBP<140  Acute Resp Failure - s/p intubation for burst  suppression 05/14/20 Swollen cords noted on reintubation 9/30, started on Dexamethasone 10mg  once then 4mg  q6h x4. Extuabted 10/1 with similar issue with stridor from swollen vocal cords requiring reintubation. P:   -Continue ventilatory support -decadron 4mg  daily -precedex for sedation and PRN fentanyl 10/30 as needed   Hypertension   P:  MAP goal 80-90  -Lisinopril 20 mg daily -Metoprolol 100 mg BID -Amlodipine 10 mg daily (lost on MAR til today, restarted) -Cleviprex as needed  Pancreatitis (acute on chronic ) - etoh 05/10/2020 - admit based on CT with lipase 101 at admit Last BM 9/28  P:   TF  Bowel regimen - miralax   anemia critical illness 05/26/2020 - no active bleeding - monitor H/H   Best practice:  Diet: TF Pain/Anxiety/Delirium protocol (if indicated): fentanyl/dilaudid/versed prn VAP protocol (if indicated): N/a DVT prophylaxis: Lovenox GI prophylaxis: N/a Glucose control: ssi Mobility: bedrest Code Status: Full code Family Communication:  FMLA form done 9/21. Mom Tammie Murray [phone]) updated today.  Disposition: Continued ICU care   LABS   PULMONARY No results for input(s): PHART, PCO2ART, PO2ART, HCO3, TCO2, O2SAT in the last 168 hours.  Invalid input(s): PCO2, PO2 CBC  Recent Labs  Lab 05/24/20 0551 05/25/20 0500 05/26/20 0533  HGB 8.6* 9.1* 8.7*  HCT 25.5* 26.8* 26.6*  WBC 17.0* 12.4* 10.5  PLT 297 346 347   COAGULATION No results for input(s): INR in the last 168 hours.  CARDIAC  No results for input(s): TROPONINI in the last 168 hours. No results for input(s): PROBNP in the last 168 hours.  CHEMISTRY Recent Labs  Lab 05/20/20 0527 05/20/20 0527 05/21/20 0522 05/21/20 0522 05/22/20 0422 05/23/20 6712 05/23/20 0508 05/24/20 0551 05/24/20 0551 05/25/20 0500 05/26/20 0533  NA 138   < > 137  --   --  134*  --  135  --  134* 134*  K 4.0   < > 3.8   < >  --  3.8   < > 3.8   < > 4.0 3.9  CL 114*   < > 112*  --   --  98  --   96*  --  91* 92*  CO2 16*   < > 17*  --   --  26  --  29  --  30 30  GLUCOSE 145*   < > 126*  --   --  111*  --  144*  --  149* 118*  BUN 15   < > 14  --   --  15  --  20  --  15 20  CREATININE 0.70   < > 0.81  --   --  0.59  --  0.62  --  0.54 0.61  CALCIUM 8.4*   < > 8.7*  --   --  9.1  --  9.4  --  9.6 9.6  MG 1.6*  --  1.8  --   --   --   --   --   --   --   --   PHOS 5.3*  --  4.0  --  3.3 3.7  --  3.9  --   --   --    < > = values in this interval not displayed.   Estimated Creatinine Clearance: 80.6 mL/min (by C-G formula based on SCr of 0.61 mg/dL).  LIVER Recent Labs  Lab 05/20/20 0527 05/21/20 0522 05/23/20 0508 05/24/20 0551 05/25/20 0500  AST 18 30 29  33 35  ALT 16 17 22 24 27   ALKPHOS 22* 31* 40 45 47  BILITOT 0.3 0.3 0.4 0.4 0.4  PROT 5.1* 6.0* 6.1* 6.2* 7.2  ALBUMIN 1.8* 2.4* 2.1* 2.3* 2.6*    INFECTIOUS Recent Labs  Lab 05/20/20 0527  LATICACIDVEN 0.8    ENDOCRINE CBG (last 3)  Recent Labs    05/26/20 0315 05/26/20 0721 05/26/20 1116  GLUCAP 99 126* 123*   CC time: 35 minutes   07/26/20, MD [place] [facility] Office: [phone]   See Amion for Pager Details

## 2020-05-27 ENCOUNTER — Inpatient Hospital Stay (HOSPITAL_COMMUNITY): Payer: Managed Care, Other (non HMO)

## 2020-05-27 LAB — BASIC METABOLIC PANEL
Anion gap: 13 (ref 5–15)
BUN: 18 mg/dL (ref 6–20)
CO2: 27 mmol/L (ref 22–32)
Calcium: 9.4 mg/dL (ref 8.9–10.3)
Chloride: 93 mmol/L — ABNORMAL LOW (ref 98–111)
Creatinine, Ser: 0.56 mg/dL (ref 0.44–1.00)
GFR calc Af Amer: >60 mL/min (ref >60)
GFR calc non Af Amer: >60 mL/min (ref >60)
Glucose, Bld: 100 mg/dL — ABNORMAL HIGH (ref 70–99)
Potassium: 4 mmol/L (ref 3.5–5.1)
Sodium: 133 mmol/L — ABNORMAL LOW (ref 135–145)

## 2020-05-27 LAB — GLUCOSE, CAPILLARY
Glucose-Capillary: 97 mg/dL (ref 70–99)
Glucose-Capillary: 111 mg/dL — ABNORMAL HIGH (ref 70–99)
Glucose-Capillary: 108 mg/dL — ABNORMAL HIGH (ref 70–99)
Glucose-Capillary: 81 mg/dL (ref 70–99)
Glucose-Capillary: 100 mg/dL — ABNORMAL HIGH (ref 70–99)

## 2020-05-27 LAB — LIPASE, BLOOD: Lipase: 18 U/L (ref 11–51)

## 2020-05-27 LAB — CBC WITH DIFFERENTIAL/PLATELET
Abs Immature Granulocytes: 0.12 10*3/uL — ABNORMAL HIGH (ref 0.00–0.07)
Basophils Absolute: 0.1 10*3/uL (ref 0.0–0.1)
Basophils Relative: 1 %
Eosinophils Absolute: 0.2 10*3/uL (ref 0.0–0.5)
Eosinophils Relative: 2 %
HCT: 25.7 % — ABNORMAL LOW (ref 36.0–46.0)
Hemoglobin: 8.7 g/dL — ABNORMAL LOW (ref 12.0–15.0)
Immature Granulocytes: 1 %
Lymphocytes Relative: 14 %
Lymphs Abs: 1.5 10*3/uL (ref 0.7–4.0)
MCH: 34.1 pg — ABNORMAL HIGH (ref 26.0–34.0)
MCHC: 33.9 g/dL (ref 30.0–36.0)
MCV: 100.8 fL — ABNORMAL HIGH (ref 80.0–100.0)
Monocytes Absolute: 1.1 10*3/uL — ABNORMAL HIGH (ref 0.1–1.0)
Monocytes Relative: 11 %
Neutro Abs: 7.5 10*3/uL (ref 1.7–7.7)
Neutrophils Relative %: 71 %
Platelets: 368 10*3/uL (ref 150–400)
RBC: 2.55 MIL/uL — ABNORMAL LOW (ref 3.87–5.11)
RDW: 11.6 % (ref 11.5–15.5)
WBC: 10.4 10*3/uL (ref 4.0–10.5)
nRBC: 0 % (ref 0.0–0.2)

## 2020-05-27 MED ORDER — LACTATED RINGERS IV BOLUS
1000.0000 mL | Freq: Once | INTRAVENOUS | Status: AC
  Administered 2020-05-27: 1000 mL via INTRAVENOUS

## 2020-05-27 MED ORDER — MIDAZOLAM HCL 2 MG/2ML IJ SOLN
INTRAMUSCULAR | Status: AC
  Filled 2020-05-27: qty 2

## 2020-05-27 MED ORDER — MIDAZOLAM HCL 2 MG/2ML IJ SOLN
2.0000 mg | INTRAMUSCULAR | Status: AC | PRN
  Administered 2020-05-27 (×2): 2 mg via INTRAVENOUS
  Filled 2020-05-27: qty 2

## 2020-05-27 NOTE — Progress Notes (Signed)
NAME:  Donna Short, MRN:  [id number], DOB:  [birth year], LOS: 16 ADMISSION DATE:  05/10/2020, CONSULTATION DATE:  05/14/20 REFERRING MD:  Loney Loh, CHIEF COMPLAINT:  Encephalopathy   Brief History   Donna Short is a 29 y.o. female with a PMHx of EtOH abuse and HTN admitted to [facility] 9/17 for acute on chronic pancreatitis and EtOH withdrawal. During her hospital stay she developed uncontrolled HTN and breakthrough seizure activity with MRI findings consistent with PRES.   Past Medical History   has a past medical history of Asthma, Depression, History of chicken pox, Hypertension, Migraines, Pancreatitis (01/2018), and Seizures (HCC). Allergies  Allergen Reactions  . Claritin [Loratadine] Itching  . Peanut-Containing Drug Products Other (See Comments)  . Benadryl [Diphenhydramine Hcl (Sleep)] Rash  . Latex Rash  . Shrimp [Shellfish Allergy] Hives and Rash     Significant Hospital Events   9/16 - admit 9/18, 9/19 breakthrough seizures 9/20- MRI showing PRES 9/21 -Patient had a hypoglycemic episode with BG 61 and shaking overnight consistent with previous episodes past 2 days. Symptoms responded well to 8 oz of juice and BG improved to 72 within 30 minutes. Likely due to poor oral intake intubation for burst suppression  9/22  - 9/22 - intubated yesterday for burst suppressio needs -> post intubation hypothermic/then febrile 102F with hypotension/pressor need. Off cleviprex -> this morning off pressors.  Continued hypoglycemia -> needing more D5. Not yet on TF. Making urine. Neuro Dr Wilford Corner concerned about recurrent PRES (milder in Jan 2021). Wants to consider LP if fever persists and culture negative. Currently on diprivan gtt and MAP at goal - 80-90  9/23 -  9/23  EEG with continued PLED per neuro. Versed increaed from 30mg /h to 40mg /h. MAx diprivan. On keppra On neop. On d5 normal saline. On TF. 40% fio2 on vent. ECHO normal. No DVT on duplex. Afebrile. MAP at 90 at goal. No labs in  results today. AUtoimmune sent yestserday. UDS Sent yesterday. Off cleviprex  9/24 - on 40% FiO2.  On ventilator.  On low-dose Neo-Synephrine to keep the MAP between 80 and 90.  Not needing Cleviprex.  Has increased Versed needs.  Currently on Versed 90 mg/h and propofol 90 mcg.  Autoimmune profile negative except for weak positive ANA.  Has hypoglycemia despite tube feeds.  Started on D10 -infusion  9/25 - remains on 40% oxygen and PEEP of 5.  On the ventilator.  RASS sedation score -5.  Propofol has been stopped because of increased triglycerides.  Continues on Versed infusion ketamine has been started.  Neuro electrophysiologist -EEG showed improved PLEDs and now replaced by intermittent spikes.  Ongoing encephalopathy present.  Overall EEG significantly improved.  Also on phenobarb and Keppra.  Dilantin still on hold. Currently off neo because mean arterial pressure is automatically between 80 and 90  9/27- Ketamine discontinued and Versed down to 5mg /hr  9/29 - Off sedation for 24 hours, extubation trial attempted, mental status not improved, re-intubated and vocal cord edema noted - started on decadron  10/1 - extubated on 10/1 with improved mental status, developed respiratory distress with stridor requiring reintubation again with notable edema of vocal cords.  Consults:  Neurology- consulted 9/20, recommended maintaining strict BP control with MAP goals 80-90, Continue Keppra 1500 mg twice daily, Dilantin 75 mg every 8 hours, phenobarb 65 mg twice daily (phenobarb level 26.6 on 05/21/2020)  Procedures:  ETT 9/21>> PICC 9/22   Significant Diagnostic Tests:  9/16 = Abdominal CT- findings consistent  with acute on chronic pancreatitis predominantly involving the proximal pancreas. No peripancreatic fluid collections are identified. EEG- 9/20, evidence of epileptogenicity arising from left posterior temporal region as well as cortical dysfunction in left hemisphere due to underlying  structural abnormality, PRES MRI- 9/20, patchy/extensive brain edema in a pattern consistent with PRES 9/22 - echo and duplex - normal 9/26 - CXR stable with lower lung infiltrates L>R  Micro Data:  SARS 9/16 - neg Trach 9/23 - nl flora Urine 9/22 - neg Blood 9/22 - neg so far MRSa PCR 9/20 -neg Autoimmune and vasculitis panel 9/22 >> ANA weak positive but  Otherwise neg U Tox 9/22 - positive benzo (Versed infusion)  Antimicrobials:  Aztreoniam 9/22 (empiric sepsis)>> 9/22, Zosyn 9/22 >> 05/19/2020  Interim history/subjective:   No acute events. Patient febrile overnight. Complaining of abdominal discomfort, had large BM.   Objective   Blood pressure 99/70, pulse 90, temperature (!) 100.9 F (38.3 C), resp. rate 15, height 5\' 2"  (1.575 m), weight 59.2 kg, SpO2 100 %.    Vent Mode: PSV;CPAP FiO2 (%):  [40 %] 40 % Set Rate:  [18 bmp] 18 bmp Vt Set:  [400 mL] 400 mL PEEP:  [5 cmH20] 5 cmH20 Pressure Support:  [14 cmH20] 14 cmH20 Plateau Pressure:  [17 cmH20-19 cmH20] 17 cmH20   Intake/Output Summary (Last 24 hours) at 05/27/2020 0935 Last data filed at 05/27/2020 0900 Gross per 24 hour  Intake 2161.88 ml  Output 1351 ml  Net 810.88 ml   Filed Weights   05/25/20 0500 05/26/20 0500 05/27/20 0500  Weight: 61 kg 56.1 kg 59.2 kg   Gen: sedated intubated HEENT: PERRL, ET Tube in place CV: Normal rhythm, S1, S2, Tachycardic, 2+ pulses Radial and DP, Cap refill <2 secs, BLE edema minimal, BUE 1+ edema in hands Pulm: Normal breath sounds, lungs clear at apex/bases  Abdomen: soft, non distended. Tolerating tube feeds. 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 Worse compared to Jan 2021.  P:   Per neuro Continue Keppra 1500 mg twice daily, Dilantin 75 mg every 8  hours, phenobarb 65 mg twice daily (phenobarb level 26.6 on 05/21/2020) HTN control, SBP<140  Acute Resp Failure - s/p intubation for burst suppression 05/14/20 Swollen cords noted on reintubation 9/30, started on Dexamethasone 10mg  once then 4mg  q6h x4. Extuabted 10/1 with similar issue with stridor from swollen vocal cords requiring reintubation. P:   -Continue ventilatory support -decadron 4mg  daily for vocal cord edema -Plan to extubate on 10/4 or 10/5 prior to considering tracheostomy. -precedex for sedation and PRN dilaudid 1mg  as needed for pain   Hypertension  P:  MAP goal 80-90  -Lisinopril 20 mg daily -Metoprolol 100 mg BID -Amlodipine 10 mg daily (lost on MAR til today, restarted) -Cleviprex as needed  Pancreatitis (acute on chronic ) - etoh 05/10/2020 - admit based on CT with lipase 101 at admit Last BM 9/28  P:   TF  Bowel regimen - miralax   anemia critical illness 05/27/2020 - no active bleeding - monitor H/H   Best practice:  Diet: TF Pain/Anxiety/Delirium protocol (if indicated): fentanyl/dilaudid/versed prn VAP protocol (if indicated): N/a DVT prophylaxis: Lovenox GI prophylaxis: N/a Glucose control: ssi Mobility: bedrest Code Status: Full code Family Communication:  FMLA form done 9/21. Mom Tammie  Murray ([phone]) will call today  Disposition: Continued ICU care   LABS   PULMONARY No results for input(s): PHART, PCO2ART, PO2ART, HCO3, TCO2, O2SAT in the last 168 hours.  Invalid input(s): PCO2, PO2 CBC Recent Labs  Lab 05/25/20 0500 05/26/20 0533 05/27/20 0322  HGB 9.1* 8.7* 8.7*  HCT 26.8* 26.6* 25.7*  WBC 12.4* 10.5 10.4  PLT 346 347 368   COAGULATION No results for input(s): INR in the last 168 hours.  CARDIAC  No results for input(s): TROPONINI in the last 168 hours. No results for input(s): PROBNP in the last 168 hours.  CHEMISTRY Recent Labs  Lab 05/21/20 0522 05/21/20 0522 05/22/20 0422 05/23/20 2426 05/23/20 8341  05/24/20 0551 05/24/20 0551 05/25/20 0500 05/25/20 0500 05/26/20 0533 05/27/20 0322  NA 137   < >  --  134*  --  135  --  134*  --  134* 133*  K 3.8   < >  --  3.8   < > 3.8   < > 4.0   < > 3.9 4.0  CL 112*   < >  --  98  --  96*  --  91*  --  92* 93*  CO2 17*   < >  --  26  --  29  --  30  --  30 27  GLUCOSE 126*   < >  --  111*  --  144*  --  149*  --  118* 100*  BUN 14   < >  --  15  --  20  --  15  --  20 18  CREATININE 0.81   < >  --  0.59  --  0.62  --  0.54  --  0.61 0.56  CALCIUM 8.7*   < >  --  9.1  --  9.4  --  9.6  --  9.6 9.4  MG 1.8  --   --   --   --   --   --   --   --   --   --   PHOS 4.0  --  3.3 3.7  --  3.9  --   --   --   --   --    < > = values in this interval not displayed.   Estimated Creatinine Clearance: 80.6 mL/min (by C-G formula based on SCr of 0.56 mg/dL).  LIVER Recent Labs  Lab 05/21/20 0522 05/23/20 0508 05/24/20 0551 05/25/20 0500  AST 30 29 33 35  ALT 17 22 24 27   ALKPHOS 31* 40 45 47  BILITOT 0.3 0.4 0.4 0.4  PROT 6.0* 6.1* 6.2* 7.2  ALBUMIN 2.4* 2.1* 2.3* 2.6*    INFECTIOUS No results for input(s): LATICACIDVEN, PROCALCITON in the last 168 hours.  ENDOCRINE CBG (last 3)  Recent Labs    05/26/20 2352 05/27/20 0332 05/27/20 0722  GLUCAP 100* 97 111*   CC time: 32 minutes   07/27/20, MD [facility] Office: [phone]   See Amion for Pager Details

## 2020-05-27 NOTE — Progress Notes (Signed)
Patient reports abdominal pain with palpation, bowel sounds active, given Tylenol for temp 101.1, large bowel movement reported at change of shift. Less than 5cc residual noted from cortrak, although diameter small. No reports of nausea, no vomiting noted. Elink made aware, CCM rounding NP also made aware and assessed patient.  TF held, lipase ordered.

## 2020-05-28 ENCOUNTER — Inpatient Hospital Stay (HOSPITAL_COMMUNITY): Payer: Managed Care, Other (non HMO)

## 2020-05-28 DIAGNOSIS — T17500A Unspecified foreign body in bronchus causing asphyxiation, initial encounter: Secondary | ICD-10-CM

## 2020-05-28 LAB — CBC WITH DIFFERENTIAL/PLATELET
Abs Immature Granulocytes: 0.06 10*3/uL (ref 0.00–0.07)
Basophils Absolute: 0.1 10*3/uL (ref 0.0–0.1)
Basophils Relative: 1 %
Eosinophils Absolute: 0.2 10*3/uL (ref 0.0–0.5)
Eosinophils Relative: 2 %
HCT: 25.6 % — ABNORMAL LOW (ref 36.0–46.0)
Hemoglobin: 8.8 g/dL — ABNORMAL LOW (ref 12.0–15.0)
Immature Granulocytes: 1 %
Lymphocytes Relative: 14 %
Lymphs Abs: 1.2 10*3/uL (ref 0.7–4.0)
MCH: 34.8 pg — ABNORMAL HIGH (ref 26.0–34.0)
MCHC: 34.4 g/dL (ref 30.0–36.0)
MCV: 101.2 fL — ABNORMAL HIGH (ref 80.0–100.0)
Monocytes Absolute: 1.1 10*3/uL — ABNORMAL HIGH (ref 0.1–1.0)
Monocytes Relative: 12 %
Neutro Abs: 6.1 10*3/uL (ref 1.7–7.7)
Neutrophils Relative %: 70 %
Platelets: 342 10*3/uL (ref 150–400)
RBC: 2.53 MIL/uL — ABNORMAL LOW (ref 3.87–5.11)
RDW: 11.3 % — ABNORMAL LOW (ref 11.5–15.5)
WBC: 8.7 10*3/uL (ref 4.0–10.5)
nRBC: 0 % (ref 0.0–0.2)

## 2020-05-28 LAB — BASIC METABOLIC PANEL
Anion gap: 9 (ref 5–15)
BUN: 17 mg/dL (ref 6–20)
CO2: 30 mmol/L (ref 22–32)
Calcium: 9.5 mg/dL (ref 8.9–10.3)
Chloride: 94 mmol/L — ABNORMAL LOW (ref 98–111)
Creatinine, Ser: 0.62 mg/dL (ref 0.44–1.00)
GFR calc Af Amer: >60 mL/min (ref >60)
GFR calc non Af Amer: >60 mL/min (ref >60)
Glucose, Bld: 125 mg/dL — ABNORMAL HIGH (ref 70–99)
Potassium: 4.1 mmol/L (ref 3.5–5.1)
Sodium: 133 mmol/L — ABNORMAL LOW (ref 135–145)

## 2020-05-28 LAB — GLUCOSE, CAPILLARY
Glucose-Capillary: 107 mg/dL — ABNORMAL HIGH (ref 70–99)
Glucose-Capillary: 113 mg/dL — ABNORMAL HIGH (ref 70–99)
Glucose-Capillary: 127 mg/dL — ABNORMAL HIGH (ref 70–99)
Glucose-Capillary: 144 mg/dL — ABNORMAL HIGH (ref 70–99)
Glucose-Capillary: 57 mg/dL — ABNORMAL LOW (ref 70–99)
Glucose-Capillary: 85 mg/dL (ref 70–99)
Glucose-Capillary: 99 mg/dL (ref 70–99)
Glucose-Capillary: 99 mg/dL (ref 70–99)

## 2020-05-28 MED ORDER — DEXTROSE 50 % IV SOLN
INTRAVENOUS | Status: AC
  Administered 2020-05-28: 25 mL
  Filled 2020-05-28: qty 50

## 2020-05-28 MED ORDER — POLYETHYLENE GLYCOL 3350 17 G PO PACK: 17.0000 g | PACK | Freq: Every day | ORAL | Status: DC

## 2020-05-28 MED ORDER — FENTANYL CITRATE (PF) 100 MCG/2ML IJ SOLN: 50.0000 ug | Freq: Once | INTRAMUSCULAR | Status: AC

## 2020-05-28 MED ORDER — LISINOPRIL 10 MG PO TABS
20.0000 mg | ORAL_TABLET | Freq: Once | ORAL | Status: AC
  Administered 2020-05-28: 20 mg
  Filled 2020-05-28: qty 2

## 2020-05-28 MED ORDER — VITAL 1.5 CAL PO LIQD
1000.0000 mL | ORAL | Status: DC
  Administered 2020-05-28 – 2020-05-29 (×2): 1000 mL
  Filled 2020-05-28 (×3): qty 1000

## 2020-05-28 MED ORDER — FENTANYL BOLUS VIA INFUSION
50.0000 ug | INTRAVENOUS | Status: DC | PRN
  Administered 2020-05-28 (×2): 50 ug via INTRAVENOUS
  Filled 2020-05-28: qty 50

## 2020-05-28 MED ORDER — VECURONIUM BROMIDE 10 MG IV SOLR
10.0000 mg | Freq: Once | INTRAVENOUS | Status: AC
  Administered 2020-05-28: 10 mg via INTRAVENOUS
  Filled 2020-05-28: qty 10

## 2020-05-28 MED ORDER — LISINOPRIL 10 MG PO TABS
40.0000 mg | ORAL_TABLET | Freq: Every day | ORAL | Status: DC
  Administered 2020-05-29 – 2020-05-31 (×3): 40 mg
  Filled 2020-05-28 (×3): qty 4

## 2020-05-28 MED ORDER — MIDAZOLAM HCL 2 MG/2ML IJ SOLN
1.0000 mg | Freq: Once | INTRAMUSCULAR | Status: AC
  Administered 2020-05-28: 1 mg via INTRAVENOUS

## 2020-05-28 MED ORDER — FENTANYL 2500MCG IN NS 250ML (10MCG/ML) PREMIX INFUSION
0.0000 ug/h | INTRAVENOUS | Status: DC
  Administered 2020-05-28: 50 ug/h via INTRAVENOUS
  Administered 2020-05-28: 200 ug/h via INTRAVENOUS
  Administered 2020-05-29: 350 ug/h via INTRAVENOUS
  Administered 2020-05-30: 100 ug/h via INTRAVENOUS
  Administered 2020-05-31: 25 ug/h via INTRAVENOUS
  Filled 2020-05-28 (×6): qty 250

## 2020-05-28 MED ORDER — ETOMIDATE 2 MG/ML IV SOLN
40.0000 mg | Freq: Once | INTRAVENOUS | Status: AC
  Administered 2020-05-28: 20 mg via INTRAVENOUS
  Filled 2020-05-28: qty 20

## 2020-05-28 MED ORDER — DOCUSATE SODIUM 50 MG/5ML PO LIQD
100.0000 mg | Freq: Two times a day (BID) | ORAL | Status: DC
  Filled 2020-05-28 (×3): qty 10

## 2020-05-28 MED ORDER — MIDAZOLAM HCL 2 MG/2ML IJ SOLN
5.0000 mg | Freq: Once | INTRAMUSCULAR | Status: AC
  Administered 2020-05-28: 4 mg via INTRAVENOUS
  Administered 2020-05-28: 2 mg via INTRAVENOUS
  Filled 2020-05-28: qty 6

## 2020-05-28 MED ORDER — FENTANYL CITRATE (PF) 100 MCG/2ML IJ SOLN
200.0000 ug | Freq: Once | INTRAMUSCULAR | Status: AC
  Administered 2020-05-28 (×2): 100 ug via INTRAVENOUS

## 2020-05-28 MED ORDER — NICARDIPINE HCL IN NACL 20-0.86 MG/200ML-% IV SOLN
3.0000 mg/h | INTRAVENOUS | Status: DC
  Administered 2020-05-28: 5 mg/h via INTRAVENOUS
  Administered 2020-05-29: 3 mg/h via INTRAVENOUS
  Filled 2020-05-28 (×2): qty 200

## 2020-05-28 MED ORDER — PROPOFOL 10 MG/ML IV BOLUS
500.0000 mg | Freq: Once | INTRAVENOUS | Status: DC
  Filled 2020-05-28: qty 60

## 2020-05-28 NOTE — Plan of Care (Signed)
?  Problem: Role Relationship: ?Goal: Method of communication will improve ?Outcome: Progressing ?  ?

## 2020-05-28 NOTE — Progress Notes (Signed)
Tracheostomy placement verified by Dr. Katrinka Blazing with bronchoscope. Chest x-ray pending.

## 2020-05-28 NOTE — Procedures (Signed)
Percutaneous Tracheostomy Procedure Note   ABIGAYL HOR  [id number]  September 06, 1990  Date:05/28/20  Time:4:45 PM   Provider Performing:Brent Josslin Sanjuan  Procedure: Percutaneous Tracheostomy with Bronchoscopic Guidance (17408)  Indication(s) Respiratory failure, laryngeal swelling, upper airway obstruction  Consent Risks of the procedure as well as the alternatives and risks of each were explained to the patient and/or caregiver.  Consent for the procedure was obtained.  Anesthesia Etomidate, Versed, Fentanyl, Vecuronium   Time Out Verified patient identification, verified procedure, site/side was marked, verified correct patient position, special equipment/implants available, medications/allergies/relevant history reviewed, required imaging and test results available.   Sterile Technique Maximal sterile technique including sterile barrier drape, hand hygiene, sterile gown, sterile gloves, mask, hair covering.    Procedure Description Appropriate anatomy identified by palpation.  Patient's neck prepped and draped in sterile fashion.  1% lidocaine with epinephrine was used to anesthetize skin overlying neck.  1.5cm incision made and blunt dissection performed until tracheal rings could be easily palpated.   Then a size 6 Shiley tracheostomy was placed under bronchoscopic visualization using usual Seldinger technique and serial dilation.   Bronchoscope confirmed placement above the carina.  Tracheostomy was sutured in place with adhesive pad to protect skin under pressure.    Patient connected to ventilator.   Complications/Tolerance None; patient tolerated the procedure well. Chest X-ray is ordered to confirm no post-procedural complication.   EBL Minimal   Specimen(s) None   Heber Douds, MD [facility] Pager: [phone] Cell: [phone] If no response, call [phone]

## 2020-05-28 NOTE — Procedures (Addendum)
Diagnostic Bronchoscopy  MAEBRY OBRIEN  [id number]  [birth year]  Date:05/28/20  Time:4:40 PM   Provider Performing:Colston Pyle Erby Pian   Procedure: Therapeutic bronchoscopy, initial  Indication(s) Assist with direct visualization of tracheostomy placement Mucus plugging of bronchi  Consent Risks of the procedure as well as the alternatives and risks of each were explained to the patient and/or caregiver.  Consent for the procedure was obtained.   Anesthesia See separate tracheostomy note   Time Out Verified patient identification, verified procedure, site/side was marked, verified correct patient position, special equipment/implants available, medications/allergies/relevant history reviewed, required imaging and test results available.   Sterile Technique Usual hand hygiene, masks, gowns, and gloves were used   Procedure Description Bronchoscope advanced through endotracheal tube and into airway.  After suctioning out tracheal secretions, bronchoscope used to provide direct visualization of tracheostomy placement.  Following tracheostomy placement, therapeutic suctioning of thick mucus plugs performed in RLL and LLL.   Complications/Tolerance None; patient tolerated the procedure well.   EBL None  Specimen(s) None

## 2020-05-28 NOTE — Progress Notes (Signed)
NAME:  Donna Short, MRN:  [id number], DOB:  11/25/1990, LOS: 17 ADMISSION DATE:  05/10/2020, CONSULTATION DATE:  05/14/20 REFERRING MD:  Loney Loh, CHIEF COMPLAINT:  Encephalopathy   Brief History   Donna Short is a 29 y.o. female with a PMHx of EtOH abuse and HTN admitted to [facility] 9/17 for acute on chronic pancreatitis and EtOH withdrawal. During her hospital stay she developed uncontrolled HTN and breakthrough seizure activity with MRI findings consistent with PRES.   Past Medical History   has a past medical history of Asthma, Depression, History of chicken pox, Hypertension, Migraines, Pancreatitis (01/2018), and Seizures (HCC).   Significant Hospital Events   9/16 - admit 9/18, 9/19 breakthrough seizures 9/20- MRI showing PRES 9/21 -Patient had a hypoglycemic episode with BG 61 and shaking overnight consistent with previous episodes past 2 days. Symptoms responded well to 8 oz of juice and BG improved to 72 within 30 minutes. Likely due to poor oral intake intubation for burst suppression  9/22  - 9/22 - intubated yesterday for burst suppressio needs -> post intubation hypothermic/then febrile 102F with hypotension/pressor need. Off cleviprex -> this morning off pressors.  Continued hypoglycemia -> needing more D5. Not yet on TF. Making urine. Neuro Dr Wilford Corner concerned about recurrent PRES (milder in Jan 2021). Wants to consider LP if fever persists and culture negative. Currently on diprivan gtt and MAP at goal - 80-90  9/23 -  9/23  EEG with continued PLED per neuro. Versed increaed from 30mg /h to 40mg /h. MAx diprivan. On keppra On neop. On d5 normal saline. On TF. 40% fio2 on vent. ECHO normal. No DVT on duplex. Afebrile. MAP at 90 at goal. No labs in results today. AUtoimmune sent yestserday. UDS Sent yesterday. Off cleviprex  9/24 - on 40% FiO2.  On ventilator.  On low-dose Neo-Synephrine to keep the MAP between 80 and 90.  Not needing Cleviprex.  Has increased Versed needs.   Currently on Versed 90 mg/h and propofol 90 mcg.  Autoimmune profile negative except for weak positive ANA.  Has hypoglycemia despite tube feeds.  Started on D10 -infusion  9/25 - remains on 40% oxygen and PEEP of 5.  On the ventilator.  RASS sedation score -5.  Propofol has been stopped because of increased triglycerides.  Continues on Versed infusion ketamine has been started.  Neuro electrophysiologist -EEG showed improved PLEDs and now replaced by intermittent spikes.  Ongoing encephalopathy present.  Overall EEG significantly improved.  Also on phenobarb and Keppra.  Dilantin still on hold. Currently off neo because mean arterial pressure is automatically between 80 and 90  9/27- Ketamine discontinued and Versed down to 5mg /hr  9/29 - Off sedation for 24 hours, extubation trial attempted, mental status not improved, re-intubated and vocal cord edema noted - started on decadron  10/1 - extubated on 10/1 with improved mental status, developed respiratory distress with stridor requiring reintubation again with notable edema of vocal cords.  Consults:  Neurology- consulted 9/20, recommended maintaining strict BP control with MAP goals 80-90, Continue Keppra 1500 mg twice daily, Dilantin 75 mg every 8 hours, phenobarb 65 mg twice daily (phenobarb level 26.6 on 05/21/2020)  Procedures:  ETT 9/21>9/29, 9/29 > 10/1, 10/1 >>> PICC 9/22   Significant Diagnostic Tests:  9/16 = Abdominal CT- findings consistent with acute on chronic pancreatitis predominantly involving the proximal pancreas. No peripancreatic fluid collections are identified. EEG- 9/20, evidence of epileptogenicity arising from left posterior temporal region as well as cortical dysfunction in  left hemisphere due to underlying structural abnormality, PRES MRI- 9/20, patchy/extensive brain edema in a pattern consistent with PRES 9/22 - echo and duplex - normal 9/26 - CXR stable with lower lung infiltrates L>R  Micro Data:  SARS 9/16 -  neg Trach 9/23 - nl flora Urine 9/22 - neg Blood 9/22 - neg so far MRSa PCR 9/20 -neg Autoimmune and vasculitis panel 9/22 >> ANA weak positive but  Otherwise neg U Tox 9/22 - positive benzo (Versed infusion)  Antimicrobials:  Aztreoniam 9/22 (empiric sepsis)>> 9/22, Zosyn 9/22 >> 05/19/2020  Interim history/subjective:   No acute events overnight. Some sedation issues and meds adjusted by [facility].   Objective   Blood pressure (!) 138/100, pulse 87, temperature 99.5 F (37.5 C), temperature source Axillary, resp. rate (!) 22, height 5\' 2"  (1.575 m), weight 58.5 kg, SpO2 94 %.    Vent Mode: CPAP;PSV FiO2 (%):  [40 %] 40 % Set Rate:  [18 bmp] 18 bmp Vt Set:  [400 mL] 400 mL PEEP:  [5 cmH20] 5 cmH20 Pressure Support:  [10 cmH20] 10 cmH20 Plateau Pressure:  [13 cmH20-18 cmH20] 18 cmH20   Intake/Output Summary (Last 24 hours) at 05/28/2020 1014 Last data filed at 05/28/2020 0900 Gross per 24 hour  Intake 1923.73 ml  Output 1150 ml  Net 773.73 ml   Filed Weights   05/26/20 0500 05/27/20 0500 05/28/20 0456  Weight: 56.1 kg 59.2 kg 58.5 kg   Gen: young adult female, intubated.  HEENT: [place]/AT, PERRL, no JVD CV: RRR, no MRG Trace peripheral edema.  Pulm: no distress on 10/5 PSV. Moderate secretions.  Abdomen: Soft, non-tender, non-distended MSK/Derm: Grossly intact, no rash Neuro: Alert, interactive, follows commands. Agitated at times, when trying to speak and not being understood.   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 Worse compared to Jan 2021. S/p burst suppression/cEEG.  - appreciate neuro assistance.  - Continue Keppra 1500 mg twice daily, Dilantin 75 mg every 8 hours, phenobarb 65 mg twice daily (phenobarb level 26.6 on 05/21/2020) - Keep SBP < 140 mm Hg  Acute Resp Failure - s/p intubation  for burst suppression 05/14/20 Swollen cords noted on reintubation 9/30, started on Dexamethasone 10mg  once then 4mg  q6h x4. Extuabted 10/1 with similar issue with stridor from swollen vocal cords requiring reintubation. - Continue ventilatory support - decadron 4mg  daily for vocal cord edema - Plan to extubate on 10/4 or 10/5 prior vs consider tracheostomy. - Precedex for sedation and PRN dilaudid 1mg  as needed for pain  Hypertension: Neuro recommends SBP < 140 mmHg -Need to wean off cleviprex.  -Lisinopril 20 mg daily, increase to 40 mg daily prior to starting third agent.  -Metoprolol 100 mg BID -Amlodipine 10 mg daily  Pancreatitis (acute on chronic ) - etoh 05/10/2020 - admit based on CT with lipase 101 at admit P:   TF  Bowel regimen - miralax   anemia critical illness- no active bleeding - monitor H/H   Best practice:  Diet: TF Pain/Anxiety/Delirium protocol (if indicated): fentanyl/dilaudid/versed prn VAP protocol (if indicated): N/a DVT prophylaxis: Lovenox GI prophylaxis: N/a Glucose control: ssi Mobility: bedrest Code Status: Full code Family Communication:  FMLA form done 9/21. Will plan to call mother after determining trach vs extubate. ([phone])  Disposition: Continued ICU care   LABS   PULMONARY  No results for input(s): PHART, PCO2ART, PO2ART, HCO3, TCO2, O2SAT in the last 168 hours.  Invalid input(s): PCO2, PO2 CBC Recent Labs  Lab 05/26/20 0533 05/27/20 0322 05/28/20 0414  HGB 8.7* 8.7* 8.8*  HCT 26.6* 25.7* 25.6*  WBC 10.5 10.4 8.7  PLT 347 368 342   COAGULATION No results for input(s): INR in the last 168 hours.  CARDIAC  No results for input(s): TROPONINI in the last 168 hours. No results for input(s): PROBNP in the last 168 hours.  CHEMISTRY Recent Labs  Lab 05/22/20 0422 05/23/20 2542 05/23/20 0508 05/24/20 0551 05/24/20 0551 05/25/20 0500 05/25/20 0500 05/26/20 0533 05/26/20 0533 05/27/20 0322 05/28/20 0414  NA  --  134*    < > 135  --  134*  --  134*  --  133* 133*  K  --  3.8   < > 3.8   < > 4.0   < > 3.9   < > 4.0 4.1  CL  --  98   < > 96*  --  91*  --  92*  --  93* 94*  CO2  --  26   < > 29  --  30  --  30  --  27 30  GLUCOSE  --  111*   < > 144*  --  149*  --  118*  --  100* 125*  BUN  --  15   < > 20  --  15  --  20  --  18 17  CREATININE  --  0.59   < > 0.62  --  0.54  --  0.61  --  0.56 0.62  CALCIUM  --  9.1   < > 9.4  --  9.6  --  9.6  --  9.4 9.5  PHOS 3.3 3.7  --  3.9  --   --   --   --   --   --   --    < > = values in this interval not displayed.   Estimated Creatinine Clearance: 80.6 mL/min (by C-G formula based on SCr of 0.62 mg/dL).  LIVER Recent Labs  Lab 05/23/20 0508 05/24/20 0551 05/25/20 0500  AST 29 33 35  ALT 22 24 27   ALKPHOS 40 45 47  BILITOT 0.4 0.4 0.4  PROT 6.1* 6.2* 7.2  ALBUMIN 2.1* 2.3* 2.6*    INFECTIOUS No results for input(s): LATICACIDVEN, PROCALCITON in the last 168 hours.  ENDOCRINE CBG (last 3)  Recent Labs    05/28/20 0014 05/28/20 0350 05/28/20 0725  GLUCAP 99 85 144*   CC time: 41 minutes   07/28/20, AGACNP-BC  Pulmonary/Critical Care  See Amion for personal pager PCCM on call pager [phone]  05/28/2020 10:26 AM

## 2020-05-28 NOTE — Progress Notes (Signed)
Nutrition Follow-up  DOCUMENTATION CODES:   Not applicable  INTERVENTION:   Tube feeding:  - Vital 1.5 @ 45 ml/hr via Cortrak (1080 ml) - ProSource TF 45 ml TID  Tube feeding regimen provides 1740 kcals, 106 grams protein, 825 ml free water (meets 100% of needs).  NUTRITION DIAGNOSIS:   Inadequate oral intake related to inability to eat as evidenced by NPO status.  Ongoing  GOAL:   Patient will meet greater than or equal to 90% of their needs  Met via TF  MONITOR:   Vent status, TF tolerance, Labs  REASON FOR ASSESSMENT:   Ventilator, Consult Enteral/tube feeding initiation and management  ASSESSMENT:   29 yo female admitted with acute on chronic pancreatitis and alcohol withdrawal. PMH includes alcohol abuse, HTN.  09/29 - extubated, gastric Cortrak tube placed, reintubated 10/01 - extubated, reintubated  Per CCM note, plan is to wean off cleviprex. Will not include these kcal when calculating tube feeding regimen.  Current weight: 58.5 kg Admit weight: 58.1 kg  Patient is currently intubated on ventilator support MV: 8.4 L/min Temp (24hrs), Avg:100.8 F (38.2 C), Min:99.1 F (37.3 C), Max:102.1 F (38.9 C)  Drips: Cleviprex: 4 ml/hr (weaning per CCM) Precedex Fentanyl NS: 10 ml/hr  Medications reviewed and include: colace, folic acid, MVI with minerals, protonix, dilantin, miralax, thiamine  Labs reviewed: sodium 133, hemoglobin 8.8 CBG's: 81-144 x 24 hours  UOP: 1150 ml x 24 hours  Diet Order:   Diet Order            Diet NPO time specified  Diet effective now                 EDUCATION NEEDS:   Not appropriate for education at this time  Skin:  Skin Assessment: Reviewed RN Assessment  Last BM:  05/27/20 medium type 6  Height:   Ht Readings from Last 1 Encounters:  05/10/20 _0  (1.575 m)    Weight:   Wt Readings from Last 1 Encounters:  05/28/20 58.5 kg    Ideal Body Weight:  50 kg  BMI:  Body mass index is 23.59  kg/m.  Estimated Nutritional Needs:   Kcal:  1750  Protein:  100-115 grams  Fluid:  >/= 1.8 L/day    Gaynell Face, MS, RD, LDN Inpatient Clinical Dietitian Please see AMiON for contact information.

## 2020-05-28 NOTE — Progress Notes (Signed)
   05/28/20 1130  Clinical Encounter Type  Visited With Patient  Visit Type Initial  Referral From Other (Comment) (Chaplain rounding unit)  Consult/Referral To Chaplain  Patient did not communicate to the Chaplain. Chaplain prayed at bedside.This note was prepared by Deneen Harts, M.Div..  For questions please contact by phone [phone].

## 2020-05-28 NOTE — Progress Notes (Signed)
eLink Physician-Brief Progress Note Patient Name: Donna Short DOB: 1990/12/26 MRN: [id number]   Date of Service  05/28/2020  HPI/Events of Note  Very agitated and uncomfortable on precedex infusion. Fentanyl and versed are off and she already got PRN dilaudid. Awake and very agitated on camera  eICU Interventions  Discontinue dilaudid Start fentanyl drip     Intervention Category Major Interventions: Change in mental status - evaluation and management  [name] G [name] 05/28/2020, 3:00 AM

## 2020-05-28 NOTE — Progress Notes (Signed)
eLink Physician-Brief Progress Note Patient Name: Donna Short DOB: 10-08-1990 MRN: [id number]   Date of Service  05/28/2020  HPI/Events of Note  Multiple issues: 1. Agitation and 2. Hypertension - BP = 171/104. Neurology recommends SBP < 140.  eICU Interventions  Will order: 1. Increase ceiling on Fentanyl IV infusion to 400 mcg/hour.  2. Nicardipine IV infusion. Titrate to SBP < 140.      Intervention Category Major Interventions: Hypertension - evaluation and management  Lasha Echeverria Eugene 05/28/2020, 9:02 PM

## 2020-05-28 NOTE — Progress Notes (Signed)
RT assisted Dr. Kendrick Fries and Dr. Katrinka Blazing at bedside for percutaneous tracheostomy placement. A #6 Cuffed Shiley was placed. Pt tolerated well. RT will continue to monitor.

## 2020-05-29 ENCOUNTER — Inpatient Hospital Stay (HOSPITAL_COMMUNITY): Payer: Managed Care, Other (non HMO)

## 2020-05-29 DIAGNOSIS — R609 Edema, unspecified: Secondary | ICD-10-CM

## 2020-05-29 DIAGNOSIS — Z930 Tracheostomy status: Secondary | ICD-10-CM

## 2020-05-29 LAB — URINALYSIS, ROUTINE W REFLEX MICROSCOPIC
Bacteria, UA: NONE SEEN
Bilirubin Urine: NEGATIVE
Glucose, UA: NEGATIVE mg/dL
Hgb urine dipstick: NEGATIVE
Ketones, ur: NEGATIVE mg/dL
Leukocytes,Ua: NEGATIVE
Nitrite: NEGATIVE
Protein, ur: 30 mg/dL — AB
Specific Gravity, Urine: 1.01 (ref 1.005–1.030)
pH: 7 (ref 5.0–8.0)

## 2020-05-29 LAB — CBC WITH DIFFERENTIAL/PLATELET
Abs Immature Granulocytes: 0.05 10*3/uL (ref 0.00–0.07)
Basophils Absolute: 0 10*3/uL (ref 0.0–0.1)
Basophils Relative: 0 %
Eosinophils Absolute: 0.1 10*3/uL (ref 0.0–0.5)
Eosinophils Relative: 1 %
HCT: 28.3 % — ABNORMAL LOW (ref 36.0–46.0)
Hemoglobin: 9.6 g/dL — ABNORMAL LOW (ref 12.0–15.0)
Immature Granulocytes: 1 %
Lymphocytes Relative: 14 %
Lymphs Abs: 1.5 10*3/uL (ref 0.7–4.0)
MCH: 34.2 pg — ABNORMAL HIGH (ref 26.0–34.0)
MCHC: 33.9 g/dL (ref 30.0–36.0)
MCV: 100.7 fL — ABNORMAL HIGH (ref 80.0–100.0)
Monocytes Absolute: 0.5 10*3/uL (ref 0.1–1.0)
Monocytes Relative: 5 %
Neutro Abs: 8.6 10*3/uL — ABNORMAL HIGH (ref 1.7–7.7)
Neutrophils Relative %: 79 %
Platelets: 343 10*3/uL (ref 150–400)
RBC: 2.81 MIL/uL — ABNORMAL LOW (ref 3.87–5.11)
RDW: 11.1 % — ABNORMAL LOW (ref 11.5–15.5)
WBC: 10.8 10*3/uL — ABNORMAL HIGH (ref 4.0–10.5)
nRBC: 0 % (ref 0.0–0.2)

## 2020-05-29 LAB — BASIC METABOLIC PANEL
Anion gap: 13 (ref 5–15)
BUN: 19 mg/dL (ref 6–20)
CO2: 24 mmol/L (ref 22–32)
Calcium: 9.3 mg/dL (ref 8.9–10.3)
Chloride: 92 mmol/L — ABNORMAL LOW (ref 98–111)
Creatinine, Ser: 0.73 mg/dL (ref 0.44–1.00)
GFR calc Af Amer: >60 mL/min (ref >60)
GFR calc non Af Amer: >60 mL/min (ref >60)
Glucose, Bld: 144 mg/dL — ABNORMAL HIGH (ref 70–99)
Potassium: 4.8 mmol/L (ref 3.5–5.1)
Sodium: 129 mmol/L — ABNORMAL LOW (ref 135–145)

## 2020-05-29 LAB — GLUCOSE, CAPILLARY
Glucose-Capillary: 112 mg/dL — ABNORMAL HIGH (ref 70–99)
Glucose-Capillary: 116 mg/dL — ABNORMAL HIGH (ref 70–99)
Glucose-Capillary: 130 mg/dL — ABNORMAL HIGH (ref 70–99)
Glucose-Capillary: 134 mg/dL — ABNORMAL HIGH (ref 70–99)
Glucose-Capillary: 37 mg/dL — CL (ref 70–99)
Glucose-Capillary: 90 mg/dL (ref 70–99)
Glucose-Capillary: 92 mg/dL (ref 70–99)

## 2020-05-29 LAB — PROCALCITONIN: Procalcitonin: 0.38 ng/mL

## 2020-05-29 LAB — MRSA PCR SCREENING: MRSA by PCR: NEGATIVE

## 2020-05-29 MED ORDER — LEVETIRACETAM 100 MG/ML PO SOLN
1500.0000 mg | Freq: Two times a day (BID) | ORAL | Status: DC
  Administered 2020-05-29 – 2020-05-31 (×4): 1500 mg
  Filled 2020-05-29 (×4): qty 15

## 2020-05-29 MED ORDER — VANCOMYCIN HCL IN DEXTROSE 1-5 GM/200ML-% IV SOLN
1000.0000 mg | Freq: Once | INTRAVENOUS | Status: AC
  Administered 2020-05-29: 1000 mg via INTRAVENOUS
  Filled 2020-05-29: qty 200

## 2020-05-29 MED ORDER — CLONIDINE HCL 0.3 MG PO TABS
0.3000 mg | ORAL_TABLET | Freq: Four times a day (QID) | ORAL | Status: DC
  Administered 2020-05-29 – 2020-05-31 (×10): 0.3 mg
  Filled 2020-05-29 (×10): qty 1

## 2020-05-29 MED ORDER — VANCOMYCIN HCL 750 MG/150ML IV SOLN
750.0000 mg | Freq: Two times a day (BID) | INTRAVENOUS | Status: DC
  Administered 2020-05-29 – 2020-05-31 (×5): 750 mg via INTRAVENOUS
  Filled 2020-05-29 (×6): qty 150

## 2020-05-29 MED ORDER — OXYCODONE HCL 5 MG PO TABS
5.0000 mg | ORAL_TABLET | Freq: Four times a day (QID) | ORAL | Status: DC
  Administered 2020-05-29 – 2020-05-31 (×10): 5 mg
  Filled 2020-05-29 (×10): qty 1

## 2020-05-29 MED ORDER — FUROSEMIDE 10 MG/ML IJ SOLN
40.0000 mg | Freq: Once | INTRAMUSCULAR | Status: AC
  Administered 2020-05-29: 40 mg via INTRAVENOUS
  Filled 2020-05-29: qty 4

## 2020-05-29 MED ORDER — PHENOBARBITAL 20 MG/5ML PO ELIX
65.0000 mg | ORAL_SOLUTION | Freq: Two times a day (BID) | ORAL | Status: DC
  Administered 2020-05-29: 65 mg
  Filled 2020-05-29: qty 20

## 2020-05-29 MED ORDER — PHENYTOIN 125 MG/5ML PO SUSP
75.0000 mg | Freq: Three times a day (TID) | ORAL | Status: DC
  Administered 2020-05-29 – 2020-05-31 (×7): 75 mg
  Filled 2020-05-29 (×9): qty 4

## 2020-05-29 MED ORDER — QUETIAPINE FUMARATE 50 MG PO TABS
25.0000 mg | ORAL_TABLET | Freq: Every day | ORAL | Status: DC
  Administered 2020-05-29 – 2020-05-30 (×2): 25 mg
  Filled 2020-05-29 (×3): qty 1

## 2020-05-29 MED ORDER — SODIUM CHLORIDE 0.9 % IV SOLN
2.0000 g | Freq: Three times a day (TID) | INTRAVENOUS | Status: DC
  Administered 2020-05-29 – 2020-05-30 (×4): 2 g via INTRAVENOUS
  Filled 2020-05-29 (×5): qty 2

## 2020-05-29 MED ORDER — SODIUM CHLORIDE 0.9 % IV SOLN
400.0000 mg | Freq: Once | INTRAVENOUS | Status: AC
  Administered 2020-05-29: 400 mg via INTRAVENOUS
  Filled 2020-05-29: qty 4

## 2020-05-29 NOTE — Progress Notes (Addendum)
NAME:  Donna Short, MRN:  [id number], DOB:  1990-11-02, LOS: 18 ADMISSION DATE:  05/10/2020, CONSULTATION DATE:  05/14/20 REFERRING MD:  Donna Short, CHIEF COMPLAINT:  Encephalopathy   Brief History   Donna Short is a 29 y.o. female with a PMHx of EtOH abuse and HTN admitted to [facility] 9/17 for acute on chronic pancreatitis and EtOH withdrawal. During her hospital stay she developed uncontrolled HTN and breakthrough seizure activity with MRI findings consistent with PRES.   Past Medical History   has a past medical history of Asthma, Depression, History of chicken pox, Hypertension, Migraines, Pancreatitis (01/2018), and Seizures (HCC).   Significant Hospital Events   9/16 - admit 9/18, 9/19 breakthrough seizures 9/20- MRI showing PRES 9/21 -Patient had a hypoglycemic episode with BG 61 and shaking overnight consistent with previous episodes past 2 days. Symptoms responded well to 8 oz of juice and BG improved to 72 within 30 minutes. Likely due to poor oral intake intubation for burst suppression  9/22  - 9/22 - intubated yesterday for burst suppressio needs -> post intubation hypothermic/then febrile 102F with hypotension/pressor need. Off cleviprex -> this morning off pressors.  Continued hypoglycemia -> needing more D5. Not yet on TF. Making urine. Neuro Dr Donna Short concerned about recurrent PRES (milder in Jan 2021). Wants to consider LP if fever persists and culture negative. Currently on diprivan gtt and MAP at goal - 80-90  9/23 -  9/23  EEG with continued PLED per neuro. Versed increaed from 30mg /h to 40mg /h. MAx diprivan. On keppra On neop. On d5 normal saline. On TF. 40% fio2 on vent. ECHO normal. No DVT on duplex. Afebrile. MAP at 90 at goal. No labs in results today. AUtoimmune sent yestserday. UDS Sent yesterday. Off cleviprex  9/24 - on 40% FiO2.  On ventilator.  On low-dose Neo-Synephrine to keep the MAP between 80 and 90.  Not needing Cleviprex.  Has increased Versed needs.   Currently on Versed 90 mg/h and propofol 90 mcg.  Autoimmune profile negative except for weak positive ANA.  Has hypoglycemia despite tube feeds.  Started on D10 -infusion  9/25 - remains on 40% oxygen and PEEP of 5.  On the ventilator.  RASS sedation score -5.  Propofol has been stopped because of increased triglycerides.  Continues on Versed infusion ketamine has been started.  Neuro electrophysiologist -EEG showed improved PLEDs and now replaced by intermittent spikes.  Ongoing encephalopathy present.  Overall EEG significantly improved.  Also on phenobarb and Keppra.  Dilantin still on hold. Currently off neo because mean arterial pressure is automatically between 80 and 90  9/27- Ketamine discontinued and Versed down to 5mg /hr  9/29 - Off sedation for 24 hours, extubation trial attempted, mental status not improved, re-intubated and vocal cord edema noted - started on decadron  10/1 - extubated on 10/1 with improved mental status, developed respiratory distress with stridor requiring reintubation again with notable edema of vocal cords.  10/4 trach  Consults:  Neurology- consulted 9/20, recommended maintaining strict BP control with MAP goals 80-90, Continue Keppra 1500 mg twice daily, Dilantin 75 mg every 8 hours, phenobarb 65 mg twice daily (phenobarb level 26.6 on 05/21/2020)  Procedures:  ETT 9/21>9/29, 9/29 > 10/1, 10/1 >>> PICC 9/22  Trach 10/4: 6 Donna (BQ)  Significant Diagnostic Tests:  9/16 = Abdominal CT- findings consistent with acute on chronic pancreatitis predominantly involving the proximal pancreas. No peripancreatic fluid collections are identified. EEG- 9/20, evidence of epileptogenicity arising from left posterior  temporal region as well as cortical dysfunction in left hemisphere due to underlying structural abnormality, PRES MRI- 9/20, patchy/extensive brain edema in a pattern consistent with PRES 9/22 - echo and duplex - normal 9/26 - CXR stable with lower lung  infiltrates L>R  Micro Data:  SARS 9/16 - neg Trach 9/23 - nl flora Urine 9/22 - neg Blood 9/22 - neg so far MRSa PCR 9/20 -neg Autoimmune and vasculitis panel 9/22 >> ANA weak positive but  Otherwise neg U Tox 9/22 - positive benzo (Versed infusion)  Antimicrobials:  Aztreoniam 9/22 (empiric sepsis)>> 9/22, Zosyn 9/22 >> 05/19/2020 Cefepime 10/5 > Vancomycin 10/5 >  Interim history/subjective:   Trach yesterday without complication. Fevers overnight and started on empiric ABX.   Objective   Blood pressure 118/79, pulse (!) 132, temperature (!) 103.3 F (39.6 C), temperature source Oral, resp. rate (!) 24, height 5\' 2"  (1.575 m), weight 58.5 kg, SpO2 99 %.    Vent Mode: PRVC FiO2 (%):  [40 %-80 %] 80 % Set Rate:  [18 bmp] 18 bmp Vt Set:  [400 mL] 400 mL PEEP:  [5 cmH20] 5 cmH20 Plateau Pressure:  [19 cmH20-24 cmH20] 24 cmH20   Intake/Output Summary (Last 24 hours) at 05/29/2020 0828 Last data filed at 05/29/2020 0600 Gross per 24 hour  Intake 2582.21 ml  Output 2200 ml  Net 382.21 ml   Filed Weights   05/26/20 0500 05/27/20 0500 05/28/20 0456  Weight: 56.1 kg 59.2 kg 58.5 kg   Gen: young adult female on vent via trach HEENT: Donna Short/AT, PERRL, no JVD. Some oozing from trach site, seems to be mostly resolved.  CV: RRR, no MRG Pulm: Clear, tol 5/5 PSV.  Abdomen: Soft, non-tender, non-distended MSK/Derm: Grossly intact, no rash Neuro: Awake alert interactive, periods of agitation .  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 Worse compared to Jan 2021. S/p burst suppression/cEEG.  - Continue Keppra 1500 mg twice daily, Dilantin 75 mg every 8 hours, phenobarb 65 mg twice daily (phenobarb level 26.6 on 05/21/2020) - Appreciate neuro/pharmacy assistance in transitioning AEDs to enteral therapies.  -  Keep SBP < 140 mm Hg  Acute Resp Failure - s/p intubation for burst suppression 05/14/20 Swollen cords noted on reintubation 9/30, started on Dexamethasone 10mg  once then 4mg  q6h x4. Extuabted 10/1 with similar issue with stridor from swollen vocal cords requiring reintubation. - Continue ventilatory support - decadron 4mg  daily for vocal cord edema - Trach 10/4, need some improvement with agitation in order to move to trach collar, but lung mechanics have been strong and it is likely she will do well.   Fevers 10/5 up to 103. Some concern for L retrocardiac opacification, may be due to oozing from trach placement.  - Reasonable to continue ABX - Check PCT - MRSA swab - Cultures - Hope to deescalate quickly - Follow CXR  Acute encephalopathy: At this point I suspect this is largely ICU delirium. - Precedex and fentanyl infusions ongoing - Will add clonidine and oxycodone in order to minimize sedative infusions.  - Encourage appropriate sleep wake cycles.  - PT/OT  Hypertension: Neuro recommends SBP < 140 mmHg -Lisinopril 40 mg daily -Metoprolol 100 mg BID -Amlodipine 10 mg daily -Nicardipine started overnight due to ongoing hypertension -Add clonidine in efforts to wean off nicardipine and hopefully improve agitation.  Pancreatitis (acute on chronic ) - etoh 05/10/2020 - admit based on CT with lipase 101 at admit P:   TF  Bowel regimen - miralax   anemia critical illness- no active bleeding - monitor H/H   Best practice:  Diet: TF Pain/Anxiety/Delirium protocol (if indicated): fentanyl/precedex/oxy/clonidine VAP protocol (if indicated): per protocol DVT prophylaxis: Lovenox GI prophylaxis: N/a Glucose control: ssi Mobility: bedrest Code Status: Full code Family Communication:  FMLA form done 9/21. Mother (Tammie) updated Disposition: Continued ICU care   LABS   PULMONARY No results for input(s): PHART, PCO2ART, PO2ART, HCO3, TCO2, O2SAT in the last 168  hours.  Invalid input(s): PCO2, PO2 CBC Recent Labs  Lab 05/27/20 0322 05/28/20 0414 05/29/20 0549  HGB 8.7* 8.8* 9.6*  HCT 25.7* 25.6* 28.3*  WBC 10.4 8.7 10.8*  PLT 368 342 343   COAGULATION No results for input(s): INR in the last 168 hours.  CARDIAC  No results for input(s): TROPONINI in the last 168 hours. No results for input(s): PROBNP in the last 168 hours.  CHEMISTRY Recent Labs  Lab 05/23/20 0508 05/23/20 0508 05/24/20 0551 05/24/20 0551 05/25/20 0500 05/25/20 0500 05/26/20 0533 05/26/20 0533 05/27/20 0322 05/27/20 0322 05/28/20 0414 05/29/20 0549  NA 134*   < > 135   < > 134*  --  134*  --  133*  --  133* 129*  K 3.8   < > 3.8   < > 4.0   < > 3.9   < > 4.0   < > 4.1 4.8  CL 98   < > 96*   < > 91*  --  92*  --  93*  --  94* 92*  CO2 26   < > 29   < > 30  --  30  --  27  --  30 24  GLUCOSE 111*   < > 144*   < > 149*  --  118*  --  100*  --  125* 144*  BUN 15   < > 20   < > 15  --  20  --  18  --  17 19  CREATININE 0.59   < > 0.62   < > 0.54  --  0.61  --  0.56  --  0.62 0.73  CALCIUM 9.1   < > 9.4   < > 9.6  --  9.6  --  9.4  --  9.5 9.3  PHOS 3.7  --  3.9  --   --   --   --   --   --   --   --   --    < > = values in this interval not displayed.   Estimated Creatinine Clearance: 80.6 mL/min (by C-G formula based on SCr of 0.73 mg/dL).  LIVER Recent Labs  Lab 05/23/20 0508 05/24/20 0551 05/25/20 0500  AST 29 33 35  ALT 22 24 27   ALKPHOS 40 45 47  BILITOT 0.4 0.4 0.4  PROT 6.1* 6.2* 7.2  ALBUMIN 2.1* 2.3* 2.6*    INFECTIOUS No results for input(s): LATICACIDVEN, PROCALCITON in the last 168 hours.  ENDOCRINE CBG (last 3)  Recent Labs    05/28/20 2319 05/29/20 0313 05/29/20 0725  GLUCAP 113* 130* 92   CC time: 40 minutes   07/29/20, AGACNP-BC [facility]  See Amion for personal pager PCCM on call pager [phone]  05/29/2020 8:28 AM

## 2020-05-29 NOTE — Progress Notes (Signed)
Per nurse secretary, called mother to update her on pt.... password given... Update given and mother verb understanding.... answered any questions she had.

## 2020-05-29 NOTE — Progress Notes (Signed)
eLink Physician-Brief Progress Note Patient Name: Donna Short DOB: [birth year] MRN: [id number]   Date of Service  05/29/2020  HPI/Events of Note  Fever to 103.2 F. CXR with L retrocardiac opacity.   eICU Interventions  Plan: 1. Blood cultures X 2 now.  2. Tracheal aspirate culture now. 3. UA with reflex microscopic.  4. Vancomycin and Cefepime per Pharmacy.     Intervention Category Major Interventions: Other:  Lenell Antu 05/29/2020, 12:41 AM

## 2020-05-29 NOTE — Progress Notes (Signed)
Pharmacy Antibiotic Note  Donna Short is a 29 y.o. female admitted on 05/10/2020 with fever.  Pharmacy has been consulted for vancomycin and cefepime dosing.  Plan: Vancomycin 1gm IV x 1 then 750 mg q12 Cefepime 2gm IV q8 hours F/u renal function, cultures and clinical course  Height: 5\' 2"  (157.5 cm) Weight: 58.5 kg (128 lb 15.5 oz) IBW/kg (Calculated) : 50.1  Temp (24hrs), Avg:100.1 F (37.8 C), Min:97.2 F (36.2 C), Max:103.2 F (39.6 C)  Recent Labs  Lab 05/24/20 0551 05/25/20 0500 05/26/20 0533 05/27/20 0322 05/28/20 0414  WBC 17.0* 12.4* 10.5 10.4 8.7  CREATININE 0.62 0.54 0.61 0.56 0.62    Estimated Creatinine Clearance: 80.6 mL/min (by C-G formula based on SCr of 0.62 mg/dL).    Allergies  Allergen Reactions  . Claritin [Loratadine] Itching  . Peanut-Containing Drug Products Other (See Comments)  . Benadryl [Diphenhydramine Hcl (Sleep)] Rash  . Latex Rash  . Shrimp [Shellfish Allergy] Hives and Rash    Thank you for allowing pharmacy to be a part of this patient's care.  07/28/20 [place] 05/29/2020 1:01 AM

## 2020-05-29 NOTE — Evaluation (Signed)
Physical Therapy Evaluation Patient Details Name: Donna Short MRN: [id number] DOB: 10-30-1990 Today's Date: 05/29/2020   History of Present Illness  29 y.o. F admitted 05/10/20 with ETOH withdrawal and pancreatitis.  Had seizure event and then  uncontrolled HTN on the floor and MRI showed PRES. Intubated 05/15/20; failed extubation 9/29 and 10/1; tracheostomy 05/28/20.  PMHx-HTN, asthma, seizure disorder, ETOH abuse  Clinical Impression   Pt admitted with above diagnosis 19 days ago. She is currently ventilated with tracheostomy and cannot provide information re: prior functional status or home set-up (per 01/2018 admission, she lived alone and was independent and working). She currently is deconditioned with inability to tolerate basic mobility without triggering prolonged coughing spells which elevate her HR and RR.  Pt currently with functional limitations due to the deficits listed below (see PT Problem List). Pt will benefit from skilled PT to increase their independence and safety with mobility to allow discharge to the venue listed below.       Follow Up Recommendations Other (comment) (to further assess as able to participate; ?CIR)    Equipment Recommendations  Other (comment) (TBA as progresses)    Recommendations for Other Services OT consult     Precautions / Restrictions Precautions Precautions: Other (comment) Precaution Comments: multiple lines Restrictions Weight Bearing Restrictions: No      Mobility  Bed Mobility               General bed mobility comments: unable to assess due to incr coughing, RR, and HR with AAROM all 4 extremities.   Transfers                    Ambulation/Gait                Stairs            Wheelchair Mobility    Modified Rankin (Stroke Patients Only)       Balance                                             Pertinent Vitals/Pain Pain Assessment: No/denies pain    Home  Living Family/patient expects to be discharged to:: Private residence Living Arrangements: Alone   Type of Home: Apartment (2nd floor) Home Access: Stairs to enter   Entrance Stairs-Number of Steps: 14 Home Layout: One level   Additional Comments: Patient unable to communicate; information pulled from 06/19 encounter    Prior Function Level of Independence: Independent (assumed per records)               Hand Dominance   Dominant Hand: Right    Extremity/Trunk Assessment   Upper Extremity Assessment Upper Extremity Assessment: Generalized weakness (RUE weaker than LUE; ?effort, lethargy or true weakness)    Lower Extremity Assessment Lower Extremity Assessment: Generalized weakness (RLE seems slightly weaker than LLE; ?effort/lethargy )       Communication   Communication: Tracheostomy  Cognition Arousal/Alertness: Lethargic;Suspect due to medications Behavior During Therapy: [facility] for tasks assessed/performed Overall Cognitive Status: Difficult to assess                                 General Comments: easily arouses and falls back to sleep when not stimulated; followed commands with all 4 extremities      General  Comments General comments (skin integrity, edema, etc.): Patient ventilated via trach on PRVC with sats 100% throughout, RR20-38, HR 108-130 bpm (with coughing); RR max 38 after coughing spell. Began moving towards chair position with worsening coughing and further mobility deferred.     Exercises Other Exercises Other Exercises: AAROM bil UEs--shoulder flexion, external/internal rotation, abduction; elbow flex/extension; hand pumps Other Exercises: AAROM bil LEs-heel slides, hip adduction/abduction, internal/external rotation, ankle pumps followed by 30 sec heel cord stretch   Assessment/Plan    PT Assessment Patient needs continued PT services  PT Problem List Decreased strength;Decreased activity tolerance;Decreased balance;Decreased  mobility;Decreased knowledge of use of DME;Cardiopulmonary status limiting activity       PT Treatment Interventions DME instruction;Gait training;Functional mobility training;Therapeutic activities;Therapeutic exercise;Balance training;Neuromuscular re-education;Cognitive remediation;Patient/family education    PT Goals (Current goals can be found in the Care Plan section)  Acute Rehab PT Goals Patient Stated Goal: unable to state; agrees/nods when asked if she wants to get moving again PT Goal Formulation: Patient unable to participate in goal setting Time For Goal Achievement: 06/12/20 Potential to Achieve Goals: Good    Frequency Min 3X/week   Barriers to discharge        Co-evaluation               AM-PAC PT "6 Clicks" Mobility  Outcome Measure Help needed turning from your back to your side while in a flat bed without using bedrails?: Total Help needed moving from lying on your back to sitting on the side of a flat bed without using bedrails?: Total Help needed moving to and from a bed to a chair (including a wheelchair)?: Total Help needed standing up from a chair using your arms (e.g., wheelchair or bedside chair)?: Total Help needed to walk in hospital room?: Total Help needed climbing 3-5 steps with a railing? : Total 6 Click Score: 6    End of Session Equipment Utilized During Treatment: Oxygen Activity Tolerance: Treatment limited secondary to medical complications (Comment) (increased coughing with mobility; did not easily subside) Patient left: in bed;with call bell/phone within reach;with restraints reapplied (bil mitts with wrist restraints) Nurse Communication: Other (comment) (following commands, incr HR, RR with coughing ) PT Visit Diagnosis: Muscle weakness (generalized) (M62.81);Difficulty in walking, not elsewhere classified (R26.2)    Time: 7673-4193 PT Time Calculation (min) (ACUTE ONLY): 28 min   Charges:   PT Evaluation $PT Eval Moderate  Complexity: 1 Mod PT Treatments $Therapeutic Exercise: 8-22 mins         Jerolyn Center, PT Pager [phone]   Zena Amos 05/29/2020, 4:55 PM

## 2020-05-29 NOTE — Progress Notes (Signed)
  Speech Language Pathology Treatment:    Patient Details Name: Donna Short MRN: [id number] DOB: Feb 01, 1991 Today's Date: 05/29/2020 Time:  -         Pt's trach just placed yesterday; on the ventilator now. Will follow for readiness for inline PMV    Royce Macadamia 05/29/2020, 8:23 AM  Breck Coons Lonell Face.Ed Nurse, children's [phone] Office [phone]

## 2020-05-29 NOTE — Progress Notes (Signed)
Upper extremity venous has been completed.   Preliminary results in CV Proc.   Blanch Media 05/29/2020 2:32 PM

## 2020-05-29 NOTE — Progress Notes (Signed)
eLink Physician-Brief Progress Note Patient Name: EMILLIA WEATHERLY DOB: 10-18-1990 MRN: [id number]   Date of Service  05/29/2020  HPI/Events of Note  Temp back up to 103 F in spite of ice bath, ice packs and Tylenol.   eICU Interventions  Plan: 1. Cooling blanket PRN.      Intervention Category Major Interventions: Other:  Sennie Borden Dennard Nip 05/29/2020, 3:35 AM

## 2020-05-30 DIAGNOSIS — K8521 Alcohol induced acute pancreatitis with uninfected necrosis: Secondary | ICD-10-CM | POA: Diagnosis not present

## 2020-05-30 DIAGNOSIS — N179 Acute kidney failure, unspecified: Secondary | ICD-10-CM | POA: Diagnosis not present

## 2020-05-30 DIAGNOSIS — I10 Essential (primary) hypertension: Secondary | ICD-10-CM | POA: Diagnosis not present

## 2020-05-30 DIAGNOSIS — G9340 Encephalopathy, unspecified: Secondary | ICD-10-CM | POA: Diagnosis not present

## 2020-05-30 LAB — POCT I-STAT 7, (LYTES, BLD GAS, ICA,H+H)
Acid-Base Excess: 2 mmol/L (ref 0.0–2.0)
Bicarbonate: 25.1 mmol/L (ref 20.0–28.0)
Calcium, Ion: 1.17 mmol/L (ref 1.15–1.40)
HCT: 26 % — ABNORMAL LOW (ref 36.0–46.0)
Hemoglobin: 8.8 g/dL — ABNORMAL LOW (ref 12.0–15.0)
O2 Saturation: 99 %
Patient temperature: 100.7
Potassium: 4.8 mmol/L (ref 3.5–5.1)
Sodium: 131 mmol/L — ABNORMAL LOW (ref 135–145)
TCO2: 26 mmol/L (ref 22–32)
pCO2 arterial: 35.3 mmHg (ref 32.0–48.0)
pH, Arterial: 7.465 — ABNORMAL HIGH (ref 7.350–7.450)
pO2, Arterial: 158 mmHg — ABNORMAL HIGH (ref 83.0–108.0)

## 2020-05-30 LAB — CBC WITH DIFFERENTIAL/PLATELET
Abs Immature Granulocytes: 0.04 10*3/uL (ref 0.00–0.07)
Basophils Absolute: 0 10*3/uL (ref 0.0–0.1)
Basophils Relative: 0 %
Eosinophils Absolute: 0.2 10*3/uL (ref 0.0–0.5)
Eosinophils Relative: 3 %
HCT: 26.6 % — ABNORMAL LOW (ref 36.0–46.0)
Hemoglobin: 8.8 g/dL — ABNORMAL LOW (ref 12.0–15.0)
Immature Granulocytes: 0 %
Lymphocytes Relative: 15 %
Lymphs Abs: 1.4 10*3/uL (ref 0.7–4.0)
MCH: 33.3 pg (ref 26.0–34.0)
MCHC: 33.1 g/dL (ref 30.0–36.0)
MCV: 100.8 fL — ABNORMAL HIGH (ref 80.0–100.0)
Monocytes Absolute: 0.7 10*3/uL (ref 0.1–1.0)
Monocytes Relative: 7 %
Neutro Abs: 6.9 10*3/uL (ref 1.7–7.7)
Neutrophils Relative %: 75 %
Platelets: 279 10*3/uL (ref 150–400)
RBC: 2.64 MIL/uL — ABNORMAL LOW (ref 3.87–5.11)
RDW: 11.4 % — ABNORMAL LOW (ref 11.5–15.5)
WBC: 9.3 10*3/uL (ref 4.0–10.5)
nRBC: 0 % (ref 0.0–0.2)

## 2020-05-30 LAB — PHOSPHORUS
Phosphorus: 1.8 mg/dL — ABNORMAL LOW (ref 2.5–4.6)
Phosphorus: 1.4 mg/dL — ABNORMAL LOW (ref 2.5–4.6)
Phosphorus: 3.6 mg/dL (ref 2.5–4.6)

## 2020-05-30 LAB — BASIC METABOLIC PANEL
Anion gap: 4 — ABNORMAL LOW (ref 5–15)
Anion gap: 14 (ref 5–15)
BUN: 13 mg/dL (ref 6–20)
BUN: 11 mg/dL (ref 6–20)
BUN: 20 mg/dL (ref 6–20)
CO2: 12 mmol/L — ABNORMAL LOW (ref 22–32)
CO2: 11 mmol/L — ABNORMAL LOW (ref 22–32)
CO2: 23 mmol/L (ref 22–32)
Calcium: 4.8 mg/dL — CL (ref 8.9–10.3)
Calcium: <4 mg/dL — CL (ref 8.9–10.3)
Calcium: 9.2 mg/dL (ref 8.9–10.3)
Chloride: 124 mmol/L — ABNORMAL HIGH (ref 98–111)
Chloride: >130 mmol/L (ref 98–111)
Chloride: 95 mmol/L — ABNORMAL LOW (ref 98–111)
Creatinine, Ser: 0.4 mg/dL — ABNORMAL LOW (ref 0.44–1.00)
Creatinine, Ser: 0.34 mg/dL — ABNORMAL LOW (ref 0.44–1.00)
Creatinine, Ser: 0.69 mg/dL (ref 0.44–1.00)
GFR calc non Af Amer: >60 mL/min (ref >60)
GFR calc non Af Amer: >60 mL/min (ref >60)
GFR calc non Af Amer: >60 mL/min (ref >60)
Glucose, Bld: 73 mg/dL (ref 70–99)
Glucose, Bld: 63 mg/dL — ABNORMAL LOW (ref 70–99)
Glucose, Bld: 126 mg/dL — ABNORMAL HIGH (ref 70–99)
Potassium: 2.3 mmol/L — CL (ref 3.5–5.1)
Potassium: <2 mmol/L — CL (ref 3.5–5.1)
Potassium: 4.8 mmol/L (ref 3.5–5.1)
Sodium: 140 mmol/L (ref 135–145)
Sodium: 142 mmol/L (ref 135–145)
Sodium: 132 mmol/L — ABNORMAL LOW (ref 135–145)

## 2020-05-30 LAB — GLUCOSE, CAPILLARY
Glucose-Capillary: 73 mg/dL (ref 70–99)
Glucose-Capillary: 106 mg/dL — ABNORMAL HIGH (ref 70–99)
Glucose-Capillary: 126 mg/dL — ABNORMAL HIGH (ref 70–99)
Glucose-Capillary: 129 mg/dL — ABNORMAL HIGH (ref 70–99)
Glucose-Capillary: 157 mg/dL — ABNORMAL HIGH (ref 70–99)

## 2020-05-30 LAB — MAGNESIUM
Magnesium: 0.8 mg/dL — CL (ref 1.7–2.4)
Magnesium: 0.7 mg/dL — CL (ref 1.7–2.4)
Magnesium: 1.7 mg/dL (ref 1.7–2.4)

## 2020-05-30 LAB — PROCALCITONIN: Procalcitonin: 1.7 ng/mL

## 2020-05-30 MED ORDER — MAGNESIUM SULFATE 4 GM/100ML IV SOLN: 4.0000 g | Freq: Once | INTRAVENOUS | Status: DC

## 2020-05-30 MED ORDER — POTASSIUM PHOSPHATES 15 MMOLE/5ML IV SOLN: 30.0000 mmol | Freq: Once | INTRAVENOUS | Status: DC

## 2020-05-30 MED ORDER — MAGNESIUM SULFATE 4 GM/100ML IV SOLN
4.0000 g | Freq: Once | INTRAVENOUS | Status: DC
  Filled 2020-05-30: qty 100

## 2020-05-30 MED ORDER — PHENOBARBITAL 20 MG/5ML PO ELIX
60.0000 mg | ORAL_SOLUTION | Freq: Two times a day (BID) | ORAL | Status: DC
  Administered 2020-05-30: 60 mg
  Filled 2020-05-30: qty 15

## 2020-05-30 MED ORDER — CYANOCOBALAMIN 500 MCG PO TABS
500.0000 ug | ORAL_TABLET | Freq: Every day | ORAL | Status: DC
  Administered 2020-05-30 – 2020-05-31 (×2): 500 ug
  Filled 2020-05-30 (×2): qty 1

## 2020-05-30 MED ORDER — POTASSIUM CHLORIDE 20 MEQ/15ML (10%) PO SOLN
40.0000 meq | Freq: Two times a day (BID) | ORAL | Status: DC
  Filled 2020-05-30: qty 30

## 2020-05-30 MED ORDER — CALCIUM GLUCONATE-NACL 2-0.675 GM/100ML-% IV SOLN
2.0000 g | Freq: Once | INTRAVENOUS | Status: DC
  Filled 2020-05-30: qty 100

## 2020-05-30 MED ORDER — POTASSIUM PHOSPHATES 15 MMOLE/5ML IV SOLN
45.0000 mmol | Freq: Once | INTRAVENOUS | Status: DC
  Filled 2020-05-30: qty 15

## 2020-05-30 MED ORDER — SODIUM CHLORIDE 0.9 % IV SOLN
2.0000 g | INTRAVENOUS | Status: DC
  Administered 2020-05-30 – 2020-05-31 (×2): 2 g via INTRAVENOUS
  Filled 2020-05-30 (×2): qty 20

## 2020-05-30 MED ORDER — PHENOBARBITAL 32.4 MG PO TABS
64.8000 mg | ORAL_TABLET | Freq: Two times a day (BID) | ORAL | Status: DC
  Administered 2020-05-31: 64.8 mg via ORAL
  Filled 2020-05-30: qty 2

## 2020-05-30 MED ORDER — PHENOBARBITAL 20 MG/5ML PO ELIX
65.0000 mg | ORAL_SOLUTION | Freq: Two times a day (BID) | ORAL | Status: AC
  Administered 2020-05-30: 65 mg
  Filled 2020-05-30: qty 22.5

## 2020-05-30 MED ORDER — CALCIUM GLUCONATE-NACL 2-0.675 GM/100ML-% IV SOLN: 2.0000 g | Freq: Once | INTRAVENOUS | Status: DC

## 2020-05-30 MED ORDER — POTASSIUM CHLORIDE 20 MEQ/15ML (10%) PO SOLN: 40.0000 meq | Freq: Once | ORAL | Status: DC

## 2020-05-30 NOTE — Progress Notes (Signed)
Verbal order received from Dr. Kendrick Fries to continue to keep pt on trach collar as tolerated. RT will continue to monitor.

## 2020-05-30 NOTE — Evaluation (Signed)
Passy-Muir Speaking Valve - Evaluation Patient Details  Name: Donna Short MRN: [id number] Date of Birth: 10/18/1990  Today's Date: 05/30/2020 Time: 6295-2841 SLP Time Calculation (min) (ACUTE ONLY): 15 min  Past Medical History:  Past Medical History:  Diagnosis Date  . Asthma   . Depression   . History of chicken pox   . Hypertension   . Migraines   . Pancreatitis 01/2018  . Seizures (HCC)    Past Surgical History:  Past Surgical History:  Procedure Laterality Date  . WISDOM TOOTH EXTRACTION     HPI:  29 y.o. admitted 05/10/20 with ETOH withdrawal and pancreatitis. Per char dad seizure event and then uncontrolled HTN and MRI showed PRES. Intubated 05/15/20; failed extubation 9/29 and 10/1; tracheostomy 05/28/20.  PMHx-HTN, asthma, seizure disorder, ETOH abuse   Assessment / Plan / Recommendation Clinical Impression  PMV eval completed while pt on trach collar. Pt suspected to have significant upper airway edema with placement of valve not exceeding 2-3 respiratroy cycles. Frequent coughing after minimal volume removed from cuff. Cough strong enough to bring mucous into hub and assisted with end of suction tubing. Therapist facilitated efficiency of cough via valve placement after inhalatory cycle to mobilize mucous to oral cavity unsuccessfully. Minimal vocalization audible during cough but completely aphonic during attempts to vocalize suspect for edema. Air is retained behind valve and removed or forced off hub. RT reported immediately after trach placed pt was able to sustain pressures without yet setting ventilator. Heart rate 120's-upper 140's briefly, RR upper 20's to upper 40's and SpO2 86%-91%. At one time she RT thought she was on corticosteriod- would this be beneficial?       SLP Visit Diagnosis: Aphonia (R49.1)    SLP Assessment  Patient needs continued Speech Lanaguage Pathology Services    Follow Up Recommendations  Inpatient Rehab    Frequency and Duration  min 2x/week  2 weeks    PMSV Trial PMSV was placed for:  (2-3 breath cycles) Able to redirect subglottic air through upper airway: No Able to Attain Phonation: No Voice Quality: Aphonic Able to Expectorate Secretions: Yes Level of Secretion Expectoration with PMSV: Tracheal Intelligibility: Unable to assess (comment) Respirations During Trial:  (upper 20's to lowe 40's briefly) SpO2 During Trial:  (86-92%) Pulse During Trial:  (upper 140's) Behavior: Controlled;Cooperative   Tracheostomy Tube       Vent Dependency  FiO2 (%): (S) 28 %    Cuff Deflation Trial  GO Tolerated Cuff Deflation: No Length of Time for Cuff Deflation Trial: 10 Behavior:  (drowsy= sedating medications)        Royce Macadamia 05/30/2020, 3:11 PM  Breck Coons Breland Elders M.Ed Nurse, children's [phone] Office [phone]

## 2020-05-30 NOTE — Progress Notes (Signed)
Labs returned.  Still with high suspicion that the labs are incorrect as it is such a vast difference from prior trends.    Plan for arterial stick to assess ABG and repeat labs   Canary Brim, MSN, NP-C, AGACNP-BC [facility] 05/30/2020, 11:41 AM   Please see [email or web address] for pager details.

## 2020-05-30 NOTE — Progress Notes (Addendum)
Arterial blood gas reviewed, I-STAT labs within normal limits.  Prior replacement orders discontinued.  Will wait for arterial draw labs to return before any replacement.  Patient asymptomatic.  On ATC.     Canary Brim, MSN, NP-C, AGACNP-BC [facility] 05/30/2020, 12:13 PM   Please see [email or web address] for pager details.

## 2020-05-30 NOTE — Progress Notes (Addendum)
NAME:  Donna Short, MRN:  [id number], DOB:  [birth year], LOS: 19 ADMISSION DATE:  05/10/2020, CONSULTATION DATE:  05/14/20 REFERRING MD:  Loney Loh, CHIEF COMPLAINT:  Encephalopathy   Brief History   Donna Short is a 29 y.o. female with a PMHx of EtOH abuse and HTN admitted to [facility] 9/17 for acute on chronic pancreatitis and EtOH withdrawal. During her hospital stay she developed uncontrolled HTN and breakthrough seizure activity with MRI findings consistent with PRES.   Past Medical History  Asthma Depression  HTN Pancreatitis  Seizures  Chicken Pox   Significant Hospital Events   9/16 Admit 9/18 Breakthrough seizure 9/19 Breakthrough seizure  9/20 MRI showing PRES 9/21 Hypoglycemic episode with BG 61 and shaking overnight consistent with previous episodes past 2 days. Symptoms responded well to 8 oz of juice and BG improved to 72 within 30 minutes. Likely due to poor oral intake intubation for burst suppression 9/22 Intubated yesterday for burst suppressio needs -> post intubation hypothermic/then febrile 102F with hypotension/pressor need. Off cleviprex -> this morning off pressors.  Continued hypoglycemia -> needing more D5. Not yet on TF. Making urine. Neuro Dr Wilford Corner concerned about recurrent PRES (milder in Jan 2021). Wants to consider LP if fever persists and culture negative. Currently on diprivan gtt and MAP at goal - 80-90 9/23 EEG with continued PLED per neuro. Versed increaed from 30mg /h to 40mg /h. MAx diprivan. On keppra On neop. On d5 normal saline. On TF. 40% fio2 on vent. ECHO normal. No DVT on duplex. Afebrile. MAP at 90 at goal. No labs in results today. Autoimmune sent yestserday. UDS Sent yesterday. Off cleviprex 9/24 On 40% FiO2.  On low-dose Neo-Synephrine to keep the MAP between 80 and 90.  Not needing Cleviprex.  Has increased Versed needs.  Currently on Versed 90 mg/h and propofol 90 mcg.  Autoimmune profile negative except for weak positive ANA.  Has  hypoglycemia despite tube feeds.  Started on D10 -infusion 9/25 Remains on 40% oxygen and PEEP of 5.  On the ventilator.  RASS sedation score -5.  Propofol has been stopped because of increased triglycerides.  Continues on Versed infusion ketamine has been started.  Neuro electrophysiologist -EEG showed improved PLEDs and now replaced by intermittent spikes.  Ongoing encephalopathy present.  Overall EEG significantly improved.  Also on phenobarb and Keppra.  Dilantin still on hold. Currently off neo because mean arterial pressure is automatically between 80 and 90 9/27 Ketamine discontinued and Versed down to 5mg /hr 9/29 Off sedation for 24 hours, extubation trial attempted, mental status not improved, re-intubated and vocal cord edema noted - started on decadron 10/01 Extubated on 10/1 with improved mental status, developed respiratory distress with stridor requiring reintubation again with notable edema of vocal cords. 10/04 Trach  Consults:  Neurology- consulted 9/20, recommended maintaining strict BP control with MAP goals 80-90, Continue Keppra 1500 mg twice daily, Dilantin 75 mg every 8 hours, phenobarb 65 mg twice daily (phenobarb level 26.6 on 05/21/2020)  Procedures:  ETT 9/21> 9/29, 9/29 > 10/1, 10/1 >> 10/4 PICC 9/22 >> Trach #6 shiley (BQ) >>  Significant Diagnostic Tests:  9/16 Abdominal CT >> findings consistent with acute on chronic pancreatitis predominantly involving the proximal pancreas. No peripancreatic fluid collections are identified. 9/20 EEG >> evidence of epileptogenicity arising from left posterior temporal region as well as cortical dysfunction in left hemisphere due to underlying structural abnormality, PRES 9/20 MRI >> patchy/extensive brain edema in a pattern consistent with PRES 9/22 ECHO and  duplex >> normal 9/26 CXR stable with lower lung infiltrates L>R  Micro Data:  SARS 9/16 >> neg Trach 9/23 >> nl flora Urine 9/22 >> neg Blood 9/22 >> MRSA PCR 9/20 >>  neg Autoimmune and vasculitis panel 9/22 >> ANA weak positive but otherwise neg U Tox 9/22 >> positive benzo (Versed infusion) MRSA PCR 10/5 >> negative Trach aspirate 10/5 >> staph >>   Antimicrobials:  Aztreoniam 9/22 (empiric sepsis) >> 9/22, Zosyn 9/22 >> 05/19/2020 Cefepime 10/5 >> 10/6 Vancomycin 10/5 >>  Ceftriaxone 10/6 >>   Interim history/subjective:  Tmax 100.7 / WBC 10.8 On PSV wean  Glucose range 73 -144 I/O 1.5L UOP, +1.4L in last 24 hours   Objective   Blood pressure 115/66, pulse 92, temperature (!) 100.7 F (38.2 C), temperature source Axillary, resp. rate (!) 27, height 5\' 2"  (1.575 m), weight (P) 57.8 kg, SpO2 93 %.    Vent Mode: PSV;CPAP FiO2 (%):  [40 %] 40 % Set Rate:  [18 bmp] 18 bmp Vt Set:  [400 mL] 400 mL PEEP:  [5 cmH20] 5 cmH20 Pressure Support:  [5 cmH20-10 cmH20] 5 cmH20 Plateau Pressure:  [16 cmH20] 16 cmH20   Intake/Output Summary (Last 24 hours) at 05/30/2020 16100924 Last data filed at 05/30/2020 0700 Gross per 24 hour  Intake 2198.35 ml  Output 1500 ml  Net 698.35 ml   Filed Weights   05/27/20 0500 05/28/20 0456 05/30/20 0500  Weight: 59.2 kg 58.5 kg (P) 57.8 kg   General: young adult female lying in bed on vent via trach in NAD   HEENT: MM pink/moist, trach midline c/d/i, creamy pink secretions from trach, sutures intact, anicteric  Neuro: awake, alert, appropriate, indicates her hands are dry CV: s1s2 rrr, ST on monitor, no m/r/g PULM: non-labored on PSV, lungs bilaterally clear GI: soft, bsx4 active  Extremities: warm/dry, trace dependent edema  Skin: no rashes or lesions  Resolved Hospital Problem list   AKI - initially presented with Cr 1.4, baseline .7-.9, normalized following administration of IV fluids   Assessment & Plan:   PRES  Recurrence with seizures 9/19. Confirmed on MRI 9/20. Worse compared to Jan 2021. S/p burst suppression/cEEG.  -appreciate Neurology input in patient care -SBP goal <140  -continue Keppra,  Dilantin, Phenobarbital -consider repeat phenobarbital level next week with phenytoin level -frequent neuro exams -PT efforts   Acute Resp Failure secondary to PRES S/p intubation for burst suppression 9/20. Swollen cords noted on reintubation 9/30, started on Dexamethasone 10mg  once then 4mg  q6h x4. Extuabted 10/1 with similar issue with stridor from swollen vocal cords requiring reintubation. Trach on 10/4.  -PRVC 8cc/kg as rest mode  -wean PEEP / fiO2 for sats >90% -follow up CXR in am  -trach care per protocol, clip trach sutures ~ 10/12 -goal trach collar as tolerated -SLP evaluation for PMV  Fevers  Temp to 103 on 10/5. Some concern for L retrocardiac opacification, may be due to oozing from trach placement.  -note staph in tracheal aspirate -follow blood cultures  -continue vancomycin for now, change to cefepime to ceftriaxone -follow PCT trend  Multiple Electrolyte Disturbances 10/6 Suspect possible lab error in draw -repeat BMP, Mg, Phos now   Acute Metabolic Encephalopathy Suspect at this point this is largely ICU delirium. -continue precedex, fentanyl with goal to wean off  -continue clonidine, oxycodone to wean IV sedation  -change ceiling of fentanyl to 200 mcg  -PT / OT efforts  -delirium prevention techniques   Hypertension -SBP goal <140  -  continue lisinopril, metoprolol, amlodipine, clonidine  -discontinue nicardipine from profile  -follow hemodynamic trend in ICU   Acute on Chronic Pancreatitis  ETOH 9/16- admit based on CT with lipase 101 at admit -TF per Nutrition  -bowel regimen with miralax -follow clinically   Macrocytic Normochromic Anemia   Most likely contribution of prior ETOH abuse, B12 deficiency + critical illness.  PPI may complicate if on long term therapy. No active bleeding.  -follow H/H  -transfuse for Hgb <7%   Best practice:  Diet: TF Pain/Anxiety/Delirium protocol (if indicated): fentanyl/precedex/oxy/clonidine VAP protocol  (if indicated): per protocol DVT prophylaxis: Lovenox GI prophylaxis: PPI Glucose control: monitoring trend Mobility: bedrest Code Status: Full code Family Communication:  FMLA form done 9/21. Mother (Tammie) updated via phone 10/6.  Mom plans to visit 10/7.  Disposition: ICU   LABS   PULMONARY No results for input(s): PHART, PCO2ART, PO2ART, HCO3, TCO2, O2SAT in the last 168 hours.  Invalid input(s): PCO2, PO2 CBC Recent Labs  Lab 05/28/20 0414 05/29/20 0549 05/30/20 0748  HGB 8.8* 9.6* 8.8*  HCT 25.6* 28.3* 26.6*  WBC 8.7 10.8* 9.3  PLT 342 343 279   COAGULATION No results for input(s): INR in the last 168 hours.  CARDIAC  No results for input(s): TROPONINI in the last 168 hours. No results for input(s): PROBNP in the last 168 hours.  CHEMISTRY Recent Labs  Lab 05/24/20 0551 05/25/20 0500 05/26/20 0533 05/26/20 0533 05/27/20 0322 05/27/20 0322 05/28/20 0414 05/28/20 0414 05/29/20 0549 05/30/20 0748  NA 135   < > 134*  --  133*  --  133*  --  129* 140  K 3.8   < > 3.9   < > 4.0   < > 4.1   < > 4.8 2.3*  CL 96*   < > 92*  --  93*  --  94*  --  92* 124*  CO2 29   < > 30  --  27  --  30  --  24 12*  GLUCOSE 144*   < > 118*  --  100*  --  125*  --  144* 73  BUN 20   < > 20  --  18  --  17  --  19 13  CREATININE 0.62   < > 0.61  --  0.56  --  0.62  --  0.73 0.40*  CALCIUM 9.4   < > 9.6  --  9.4  --  9.5  --  9.3 4.8*  MG  --   --   --   --   --   --   --   --   --  0.8*  PHOS 3.9  --   --   --   --   --   --   --   --  1.8*   < > = values in this interval not displayed.   Estimated Creatinine Clearance: 80.6 mL/min (A) (by C-G formula based on SCr of 0.4 mg/dL (L)).  LIVER Recent Labs  Lab 05/24/20 0551 05/25/20 0500  AST 33 35  ALT 24 27  ALKPHOS 45 47  BILITOT 0.4 0.4  PROT 6.2* 7.2  ALBUMIN 2.3* 2.6*    INFECTIOUS Recent Labs  Lab 05/29/20 0547  PROCALCITON 0.38    ENDOCRINE CBG (last 3)  Recent Labs    05/29/20 1946 05/29/20 2341  05/30/20 0336  GLUCAP 134* 116* 73   CC time: 36 minutes    Canary Brim,  MSN, NP-C, AGACNP-BC [facility] 05/30/2020, 10:06 AM   Please see [email or web address] for pager details.

## 2020-05-30 NOTE — Progress Notes (Signed)
Pt placed on CP/PS wean at this time tolerating well.  RN made aware.  RT will monitor.

## 2020-05-30 NOTE — Progress Notes (Signed)
RT in to stick pt for ABG and repeat labs. Brandi NP CCM assisted. ABG results given to [facility] NP CCM, no new orders received at this time. Pt also placed on aerosol trach collar at this time 8L 40%.  RT will continue to monitor.

## 2020-05-31 ENCOUNTER — Inpatient Hospital Stay (HOSPITAL_COMMUNITY): Payer: Managed Care, Other (non HMO)

## 2020-05-31 ENCOUNTER — Other Ambulatory Visit (HOSPITAL_COMMUNITY): Payer: Self-pay

## 2020-05-31 ENCOUNTER — Inpatient Hospital Stay
Admission: AD | Admit: 2020-05-31 | Discharge: 2020-06-16 | Disposition: A | Discharge status: Home / Self Care | Payer: Managed Care, Other (non HMO) | Source: Other Acute Inpatient Hospital | Attending: Internal Medicine | Admitting: Internal Medicine

## 2020-05-31 DIAGNOSIS — J9690 Respiratory failure, unspecified, unspecified whether with hypoxia or hypercapnia: Secondary | ICD-10-CM

## 2020-05-31 DIAGNOSIS — G9341 Metabolic encephalopathy: Secondary | ICD-10-CM

## 2020-05-31 DIAGNOSIS — G40901 Epilepsy, unspecified, not intractable, with status epilepticus: Secondary | ICD-10-CM | POA: Diagnosis present

## 2020-05-31 DIAGNOSIS — Z931 Gastrostomy status: Secondary | ICD-10-CM

## 2020-05-31 DIAGNOSIS — F1010 Alcohol abuse, uncomplicated: Secondary | ICD-10-CM | POA: Diagnosis present

## 2020-05-31 DIAGNOSIS — J45909 Unspecified asthma, uncomplicated: Secondary | ICD-10-CM | POA: Diagnosis present

## 2020-05-31 DIAGNOSIS — J189 Pneumonia, unspecified organism: Secondary | ICD-10-CM

## 2020-05-31 DIAGNOSIS — J9621 Acute and chronic respiratory failure with hypoxia: Secondary | ICD-10-CM

## 2020-05-31 LAB — COMPREHENSIVE METABOLIC PANEL
ALT: 34 U/L (ref 0–44)
AST: 35 U/L (ref 15–41)
Albumin: 2.6 g/dL — ABNORMAL LOW (ref 3.5–5.0)
Alkaline Phosphatase: 45 U/L (ref 38–126)
Anion gap: 13 (ref 5–15)
BUN: 21 mg/dL — ABNORMAL HIGH (ref 6–20)
CO2: 21 mmol/L — ABNORMAL LOW (ref 22–32)
Calcium: 9.1 mg/dL (ref 8.9–10.3)
Chloride: 99 mmol/L (ref 98–111)
Creatinine, Ser: 0.73 mg/dL (ref 0.44–1.00)
GFR calc non Af Amer: >60 mL/min (ref >60)
Glucose, Bld: 136 mg/dL — ABNORMAL HIGH (ref 70–99)
Potassium: 4.1 mmol/L (ref 3.5–5.1)
Sodium: 133 mmol/L — ABNORMAL LOW (ref 135–145)
Total Bilirubin: 0.5 mg/dL (ref 0.3–1.2)
Total Protein: 7.3 g/dL (ref 6.5–8.1)

## 2020-05-31 LAB — CBC WITH DIFFERENTIAL/PLATELET
Abs Immature Granulocytes: 0.05 10*3/uL (ref 0.00–0.07)
Basophils Absolute: 0 10*3/uL (ref 0.0–0.1)
Basophils Relative: 0 %
Eosinophils Absolute: 0.3 10*3/uL (ref 0.0–0.5)
Eosinophils Relative: 4 %
HCT: 25.3 % — ABNORMAL LOW (ref 36.0–46.0)
Hemoglobin: 8.5 g/dL — ABNORMAL LOW (ref 12.0–15.0)
Immature Granulocytes: 1 %
Lymphocytes Relative: 21 %
Lymphs Abs: 1.6 10*3/uL (ref 0.7–4.0)
MCH: 33.7 pg (ref 26.0–34.0)
MCHC: 33.6 g/dL (ref 30.0–36.0)
MCV: 100.4 fL — ABNORMAL HIGH (ref 80.0–100.0)
Monocytes Absolute: 0.7 10*3/uL (ref 0.1–1.0)
Monocytes Relative: 9 %
Neutro Abs: 4.8 10*3/uL (ref 1.7–7.7)
Neutrophils Relative %: 65 %
Platelets: 239 10*3/uL (ref 150–400)
RBC: 2.52 MIL/uL — ABNORMAL LOW (ref 3.87–5.11)
RDW: 11.4 % — ABNORMAL LOW (ref 11.5–15.5)
WBC: 7.3 10*3/uL (ref 4.0–10.5)
nRBC: 0 % (ref 0.0–0.2)

## 2020-05-31 LAB — GLUCOSE, CAPILLARY
Glucose-Capillary: 107 mg/dL — ABNORMAL HIGH (ref 70–99)
Glucose-Capillary: 95 mg/dL (ref 70–99)
Glucose-Capillary: 108 mg/dL — ABNORMAL HIGH (ref 70–99)

## 2020-05-31 LAB — PHOSPHORUS: Phosphorus: 3.5 mg/dL (ref 2.5–4.6)

## 2020-05-31 LAB — PROCALCITONIN: Procalcitonin: 3.67 ng/mL

## 2020-05-31 LAB — MAGNESIUM: Magnesium: 1.7 mg/dL (ref 1.7–2.4)

## 2020-05-31 MED ORDER — ACETAMINOPHEN 325 MG PO TABS: 650.0000 mg | ORAL_TABLET | Freq: Four times a day (QID) | ORAL | Status: DC | PRN

## 2020-05-31 MED ORDER — PROSOURCE TF PO LIQD: 45.0000 mL | Freq: Two times a day (BID) | ORAL | Status: DC

## 2020-05-31 MED ORDER — LABETALOL HCL 5 MG/ML IV SOLN: 10.0000 mg | INTRAVENOUS | Status: DC | PRN

## 2020-05-31 MED ORDER — LISINOPRIL 40 MG PO TABS: 40.0000 mg | ORAL_TABLET | Freq: Every day | ORAL | Status: DC

## 2020-05-31 MED ORDER — HALOPERIDOL LACTATE 5 MG/ML IJ SOLN: 1.0000 mg | INTRAMUSCULAR | Status: DC | PRN

## 2020-05-31 MED ORDER — POLYETHYLENE GLYCOL 3350 17 G PO PACK: 17.0000 g | PACK | Freq: Every day | ORAL | 0 refills | Status: DC

## 2020-05-31 MED ORDER — CHLORHEXIDINE GLUCONATE CLOTH 2 % EX PADS: 6.0000 | MEDICATED_PAD | Freq: Every day | CUTANEOUS | Status: DC

## 2020-05-31 MED ORDER — OXYCODONE HCL 5 MG PO TABS
5.0000 mg | ORAL_TABLET | Freq: Four times a day (QID) | ORAL | 0 refills | Status: DC
Start: 2020-05-31 — End: 2020-06-27

## 2020-05-31 MED ORDER — LEVETIRACETAM 100 MG/ML PO SOLN: 1500.0000 mg | Freq: Two times a day (BID) | ORAL | 12 refills | Status: DC

## 2020-05-31 MED ORDER — METOPROLOL TARTRATE 100 MG PO TABS: 100.0000 mg | ORAL_TABLET | Freq: Two times a day (BID) | ORAL | Status: DC

## 2020-05-31 MED ORDER — SODIUM CHLORIDE 0.9% FLUSH: 10.0000 mL | Freq: Two times a day (BID) | INTRAVENOUS | Status: DC

## 2020-05-31 MED ORDER — ADULT MULTIVITAMIN W/MINERALS CH: 1.0000 | ORAL_TABLET | Freq: Every day | ORAL | Status: DC

## 2020-05-31 MED ORDER — CYANOCOBALAMIN 500 MCG PO TABS: 500.0000 ug | ORAL_TABLET | Freq: Every day | ORAL | Status: DC

## 2020-05-31 MED ORDER — CLONIDINE HCL 0.3 MG PO TABS: 0.3000 mg | ORAL_TABLET | Freq: Four times a day (QID) | ORAL | Status: DC

## 2020-05-31 MED ORDER — SODIUM CHLORIDE 0.9 % IV SOLN: 2.0000 g | INTRAVENOUS | Status: DC

## 2020-05-31 MED ORDER — THIAMINE HCL 100 MG PO TABS: 100.0000 mg | ORAL_TABLET | Freq: Every day | ORAL | Status: DC

## 2020-05-31 MED ORDER — ENOXAPARIN SODIUM 40 MG/0.4ML ~~LOC~~ SOLN: 40.0000 mg | Freq: Every day | SUBCUTANEOUS | Status: DC

## 2020-05-31 MED ORDER — VITAL 1.5 CAL PO LIQD
1000.0000 mL | ORAL | Status: DC
  Administered 2020-05-31: 1000 mL

## 2020-05-31 MED ORDER — SODIUM CHLORIDE 0.9 % IV SOLN: 250.0000 mL | INTRAVENOUS | 0 refills | Status: DC

## 2020-05-31 MED ORDER — PHENOBARBITAL 64.8 MG PO TABS: 64.8000 mg | ORAL_TABLET | Freq: Two times a day (BID) | ORAL | Status: DC

## 2020-05-31 MED ORDER — AMLODIPINE BESYLATE 10 MG PO TABS: 10.0000 mg | ORAL_TABLET | Freq: Every day | ORAL | Status: DC

## 2020-05-31 MED ORDER — AMLODIPINE BESYLATE 10 MG PO TABS
10.0000 mg | ORAL_TABLET | Freq: Every day | ORAL | Status: DC
  Administered 2020-05-31: 10 mg
  Filled 2020-05-31: qty 1

## 2020-05-31 MED ORDER — PHENYTOIN 125 MG/5ML PO SUSP: 75.0000 mg | Freq: Three times a day (TID) | ORAL | 12 refills | Status: DC

## 2020-05-31 MED ORDER — ORAL CARE MOUTH RINSE: 15.0000 mL | Freq: Two times a day (BID) | OROMUCOSAL | 0 refills | Status: DC

## 2020-05-31 MED ORDER — DOCUSATE SODIUM 50 MG/5ML PO LIQD: 100.0000 mg | Freq: Two times a day (BID) | ORAL | 0 refills | Status: DC

## 2020-05-31 MED ORDER — CHLORHEXIDINE GLUCONATE 0.12% ORAL RINSE (MEDLINE KIT): 15.0000 mL | Freq: Two times a day (BID) | OROMUCOSAL | 0 refills | Status: DC

## 2020-05-31 MED ORDER — ONDANSETRON HCL 4 MG PO TABS: 4.0000 mg | ORAL_TABLET | Freq: Four times a day (QID) | ORAL | 0 refills | Status: DC | PRN

## 2020-05-31 MED ORDER — PANTOPRAZOLE SODIUM 40 MG PO PACK: 40.0000 mg | PACK | Freq: Every day | ORAL | Status: DC

## 2020-05-31 MED ORDER — VANCOMYCIN HCL 750 MG/150ML IV SOLN: 750.0000 mg | Freq: Two times a day (BID) | INTRAVENOUS | Status: DC

## 2020-05-31 MED ORDER — FOLIC ACID 1 MG PO TABS: 1.0000 mg | ORAL_TABLET | Freq: Every day | ORAL | Status: DC

## 2020-05-31 MED ORDER — VITAL 1.5 CAL PO LIQD: 1000.0000 mL | ORAL | Status: DC

## 2020-05-31 MED ORDER — ALBUTEROL SULFATE (2.5 MG/3ML) 0.083% IN NEBU: 2.5000 mg | INHALATION_SOLUTION | Freq: Four times a day (QID) | RESPIRATORY_TRACT | 12 refills | Status: DC | PRN

## 2020-05-31 MED ORDER — QUETIAPINE FUMARATE 25 MG PO TABS: 25.0000 mg | ORAL_TABLET | Freq: Every day | ORAL | Status: DC

## 2020-05-31 NOTE — Progress Notes (Signed)
Nutrition Follow-up  DOCUMENTATION CODES:   Not applicable  INTERVENTION:   Tube feeding:  - Vital 1.5 @ 50 ml/hr via Cortrak (1200 ml) - ProSource TF 45 ml BID  Tube feeding regimen provides 1880 kcals, 103 grams protein, 917 ml free water (meets 100% of needs).  Would not d/c Cortrak or EN until pt demonstrates consistent PO intake.   NUTRITION DIAGNOSIS:   Inadequate oral intake related to inability to eat as evidenced by NPO status.  Ongoing  GOAL:   Patient will meet greater than or equal to 90% of their needs  Met via TF  MONITOR:   Vent status, TF tolerance, Labs  REASON FOR ASSESSMENT:   Ventilator, Consult Enteral/tube feeding initiation and management  ASSESSMENT:   29 yo female admitted with acute on chronic pancreatitis and alcohol withdrawal. PMH includes alcohol abuse, HTN.  09/29- extubated, gastric Cortrak tube placed, reintubated 10/1- extubated, reintubated 10/4- trach   Tolerated ATC for 24 hours. SLP to see for PMV and swallow evaluation. Tolerating tube feeding at goal rate via Cortrak. Adjust rate to better meet needs. Would not d/c Cortrak or EN until pt demonstrates consistent PO intake.   Current weight: 58.5 kg Admit weight: 49.2 kg   UOP: 1750 ml x 24 hrs   Medications: colace, folic acid, MVI, miralax, thiamine, Vit B12 Labs: Na 133 (L) CBG 95-129  Diet Order:   Diet Order    None      EDUCATION NEEDS:   Not appropriate for education at this time  Skin:  Skin Assessment: Reviewed RN Assessment  Last BM:  10/7  Height:   Ht Readings from Last 1 Encounters:  05/10/20 _0  (1.575 m)    Weight:   Wt Readings from Last 1 Encounters:  05/31/20 49.2 kg    Ideal Body Weight:  50 kg  BMI:  Body mass index is 19.84 kg/m.  Estimated Nutritional Needs:   Kcal:  1700-1900 kcal  Protein:  100-115 g  Fluid:  >/= 1.6 L/day  Mariana Single RD, LDN Clinical Nutrition Pager listed in [place]

## 2020-05-31 NOTE — Progress Notes (Signed)
PT Cancellation Note  Patient Details Name: Donna Short MRN: [id number] DOB: [birth year]   Cancelled Treatment:    Reason Eval/Treat Not Completed: Patient not medically ready. Hold per RN, pt agitated and restrained. 05/31/2020  Jacinto Halim., PT Acute Rehabilitation Services [phone]  (pager) [phone]  (office)   [name] 05/31/2020, 12:10 PM

## 2020-05-31 NOTE — TOC Transition Note (Addendum)
Transition of Care [facility]) - CM/SW Discharge Note   Patient Details  Name: Donna Short MRN: [id number] Date of Birth: Mar 14, 1991  [facility] CM/SW Contact:  Nance Pear, RN Phone Number: 05/31/2020, 3:54 PM   Clinical Narrative:    Patient to be admitted to [facility] post hospital discharge.  Select, Corrina, called to let CM know about need to contact [employer] about admission.  Called Loni Beckwith., for a transfer authorization waiver due to COVID for inpatient demands.  Confirmation ID #: [id number].  Patient will be going to 5E05, receiving doctor is Hijazi. Nurse, Bonita Quin, notified of room number.   Final next level of care: Long Term Acute Care (LTAC) Barriers to Discharge: No Barriers Identified   Patient Goals and CMS Choice     Choice offered to / list presented to : Parent  Discharge Placement                       Discharge Plan and Services In-house Referral: Clinical Social Work Discharge Planning Services: CM Consult Post Acute Care Choice: Long Term Acute Care (LTAC)                               Social Determinants of Health (SDOH) Interventions     Readmission Risk Interventions No flowsheet data found.

## 2020-05-31 NOTE — Progress Notes (Signed)
NAME:  Donna Short, MRN:  [id number], DOB:  1990-11-06, LOS: 20 ADMISSION DATE:  05/10/2020, CONSULTATION DATE:  05/14/20 REFERRING MD:  Loney Lohathore, CHIEF COMPLAINT:  Encephalopathy   Brief History   Donna CurlsShamika M Fishel is a 29 y.o. female with a PMHx of EtOH abuse and HTN [facility] for acute on chronic pancreatitis and EtOH withdrawal. During her hospital stay she developed uncontrolled HTN and breakthrough seizure activity with MRI findings consistent with PRES.   Past Medical History  Asthma Depression  HTN Pancreatitis  Seizures  Chicken Pox   Significant Hospital Events   9/16 Admit 9/18 Breakthrough seizure 9/19 Breakthrough seizure  9/20 MRI showing PRES 9/21 Hypoglycemic episode with BG 61 and shaking overnight consistent with previous episodes past 2 days. Symptoms responded well to 8 oz of juice and BG improved to 72 within 30 minutes. Likely due to poor oral intake intubation for burst suppression 9/22 Intubated yesterday for burst suppressio needs -> post intubation hypothermic/then febrile 102F with hypotension/pressor need. Off cleviprex -> this morning off pressors.  Continued hypoglycemia -> needing more D5. Not yet on TF. Making urine. Neuro Dr Wilford CornerArora concerned about recurrent PRES (milder in Jan 2021). Wants to consider LP if fever persists and culture negative. Currently on diprivan gtt and MAP at goal - 80-90 9/23 EEG with continued PLED per neuro. Versed increaed from 30mg /h to 40mg /h. MAx diprivan. On keppra On neop. On d5 normal saline. On TF. 40% fio2 on vent. ECHO normal. No DVT on duplex. Afebrile. MAP at 90 at goal. No labs in results today. Autoimmune sent yestserday. UDS Sent yesterday. Off cleviprex 9/24 On 40% FiO2.  On low-dose Neo-Synephrine to keep the MAP between 80 and 90.  Not needing Cleviprex.  Has increased Versed needs.  Currently on Versed 90 mg/h and propofol 90 mcg.  Autoimmune profile negative except for weak positive ANA.  Has  hypoglycemia despite tube feeds.  Started on D10 -infusion 9/25 Remains on 40% oxygen and PEEP of 5.  On the ventilator.  RASS sedation score -5.  Propofol has been stopped because of increased triglycerides.  Continues on Versed infusion ketamine has been started.  Neuro electrophysiologist -EEG showed improved PLEDs and now replaced by intermittent spikes.  Ongoing encephalopathy present.  Overall EEG significantly improved.  Also on phenobarb and Keppra.  Dilantin still on hold. Currently off neo because mean arterial pressure is automatically between 80 and 90 9/27 Ketamine discontinued and Versed down to 5mg /hr 9/29 Off sedation for 24 hours, extubation trial attempted, mental status not improved, re-intubated and vocal cord edema noted - started on decadron 10/01 Extubated on 10/1 with improved mental status, developed respiratory distress with stridor requiring reintubation again with notable edema of vocal cords. 10/04 Trach 10/06 ATC  10/07 24 hours ATC  Consults:  Neurology- consulted 9/20, recommended maintaining strict BP control with MAP goals 80-90, Continue Keppra 1500 mg twice daily, Dilantin 75 mg every 8 hours, phenobarb 65 mg twice daily (phenobarb level 26.6 on 05/21/2020)  Procedures:  ETT 9/21> 9/29, 9/29 > 10/1, 10/1 >> 10/4 PICC 9/22 >> Trach #6 shiley (BQ) >>  Significant Diagnostic Tests:  9/16 Abdominal CT >> findings consistent with acute on chronic pancreatitis predominantly involving the proximal pancreas. No peripancreatic fluid collections are identified. 9/20 EEG >> evidence of epileptogenicity arising from left posterior temporal region as well as cortical dysfunction in left hemisphere due to underlying structural abnormality, PRES 9/20 MRI >> patchy/extensive brain edema in a  pattern consistent with PRES 9/22 ECHO and duplex >> normal 9/26 CXR stable with lower lung infiltrates L>R  Micro Data:  SARS 9/16 >> neg Trach 9/23 >> nl flora Urine 9/22 >>  neg Blood 9/22 >> not done MRSA PCR 9/20 >> neg Autoimmune and vasculitis panel 9/22 >> ANA weak positive but otherwise neg U Tox 9/22 >> positive benzo (Versed infusion) MRSA PCR 10/5 >> negative Trach aspirate 10/5 >> staph >>  BCx2 10/5 >>   Antimicrobials:  Aztreoniam 9/22 (empiric sepsis) >> 9/22, Zosyn 9/22 >> 05/19/2020 Cefepime 10/5 >> 10/6 Vancomycin 10/5 >>  Ceftriaxone 10/6 >>   Interim history/subjective:  Remains on 28% ATC x 24 hours  Glucose range 73 - 144  Tmax 102.2 / WBC 7.3  I/O UOP 1.7L, +49ml in last 24 hours  RN reports no acute events overnight  Objective   Blood pressure 121/88, pulse 82, temperature 98.3 F (36.8 C), temperature source Oral, resp. rate (!) 28, height 5\' 2"  (1.575 m), weight 49.2 kg, SpO2 97 %.    FiO2 (%):  [28 %-40 %] 28 %   Intake/Output Summary (Last 24 hours) at 05/31/2020 0934 Last data filed at 05/31/2020 0800 Gross per 24 hour  Intake 2200.67 ml  Output 2000 ml  Net 200.67 ml   Filed Weights   05/28/20 0456 05/30/20 0500 05/31/20 0500  Weight: 58.5 kg (P) 57.8 kg 49.2 kg   General: young adult female lying in bed, covers over her head HEENT: MM pink/moist, trach midline c/d/i, sutures intact Neuro: Awakens to voice, denies complaints, interacts by mouthing words CV: s1s2 rrr, no m/r/g PULM: non-labored on ATC 28%, lungs bilaterally clear GI: soft, bsx4 active  Extremities: warm/dry, no edema  Skin: no rashes or lesions  PCXR 10/7 >> images personally reviewed, trach/feeding tube in good position, mild bibasilar atelectasis, low lung volumes  Resolved Hospital Problem list   AKI - initially presented with Cr 1.4, baseline .7-.9, normalized following administration of IV fluids   Assessment & Plan:   PRES  Recurrence with seizures 9/19. Confirmed on MRI 9/20. Worse compared to Jan 2021. S/p burst suppression/cEEG.  -appreciate Neurology  -SBP goal <140 -continue Keppra, Dilantin, Phenobarbital  -follow neuro exam   -PT efforts / OOB -consider repeat phenobarbital level week of 10/14 with next phenytoin level   Acute Resp Failure secondary to PRES S/p intubation for burst suppression 9/20. Swollen cords noted on reintubation 9/30, started on Dexamethasone 10mg  once then 4mg  q6h x4. Extuabted 10/1 with similar issue with stridor from swollen vocal cords requiring reintubation. Trach on 10/4.  -PRVC 8cc/kg as rest mode if needed -wean FiO2 for sats >90% -follow intermittent CXR  -trach care per protocol, clip trach sutures around 10/12 -ATC as tolerated -SLP evaluation for PMV, swallowing  -if remains off vent, consider transfer out of ICU 10/8  Fevers  Staph in Tracheal Aspirate Temp to 103 on 10/5, 102.2 on 10/6. Some concern for L retrocardiac opacification. No shift on differential but elevated PCT.  -follow tracheal aspirate, blood cultures to maturity  -await staph species before discontinuing vancomycin  -follow PCT (3.67)  Multiple Electrolyte Disturbances 10/6 Error in lab draw -follow labs intermittently   Acute Metabolic Encephalopathy Suspect at this point this is largely ICU delirium. -stop precedex, fentanyl  -continue clonidine, oxycodone -PT efforts  -delirium prevention techniques  -consider weaning enteral agents starting 10/8 or 10/9  Hypertension -SBP goal <140 -continue lisinopril, metoprolol, amlodipine, clonidine  -follow hemodynamic trend in ICU  Acute on Chronic Pancreatitis  ETOH 9/16- admit based on CT with lipase 101 at admit -TF per Nutrition  -miralax -follow clinically  Macrocytic Normochromic Anemia   Most likely contribution of prior ETOH abuse, B12 deficiency + critical illness.  PPI may complicate if on long term therapy. No active bleeding.  -follow H/H  -transfuse for Hgb <7% or active bleeding    Best practice:  Diet: TF Pain/Anxiety/Delirium protocol (if indicated): fentanyl/precedex/oxy/clonidine VAP protocol (if indicated): per  protocol DVT prophylaxis: Lovenox GI prophylaxis: PPI Glucose control: monitoring trend Mobility: bedrest Code Status: Full code Family Communication:  FMLA form done 9/21. Mother (Tammie) updated via phone 10/6.  Mom plans to visit 10/7.  Called for update via phone 10/7.  Disposition: ICU.   LABS   PULMONARY Recent Labs  Lab 05/30/20 1210  PHART 7.465*  PCO2ART 35.3  PO2ART 158*  HCO3 25.1  TCO2 26  O2SAT 99.0   CBC Recent Labs  Lab 05/29/20 0549 05/29/20 0549 05/30/20 0748 05/30/20 1210 05/31/20 0422  HGB 9.6*   < > 8.8* 8.8* 8.5*  HCT 28.3*   < > 26.6* 26.0* 25.3*  WBC 10.8*  --  9.3  --  7.3  PLT 343  --  279  --  239   < > = values in this interval not displayed.   COAGULATION No results for input(s): INR in the last 168 hours.  CARDIAC  No results for input(s): TROPONINI in the last 168 hours. No results for input(s): PROBNP in the last 168 hours.  CHEMISTRY Recent Labs  Lab 05/29/20 0549 05/29/20 0549 05/30/20 0748 05/30/20 0748 05/30/20 0944 05/30/20 0944 05/30/20 1204 05/30/20 1204 05/30/20 1210 05/31/20 0422  NA 129*   < > 140  --  142  --  132*  --  131* 133*  K 4.8   < > 2.3*   < > <2.0*   < > 4.8   < > 4.8 4.1  CL 92*  --  124*  --  >130*  --  95*  --   --  99  CO2 24  --  12*  --  11*  --  23  --   --  21*  GLUCOSE 144*  --  73  --  63*  --  126*  --   --  136*  BUN 19  --  13  --  11  --  20  --   --  21*  CREATININE 0.73  --  0.40*  --  0.34*  --  0.69  --   --  0.73  CALCIUM 9.3  --  4.8*  --  <4.0*  --  9.2  --   --  9.1  MG  --   --  0.8*  --  0.7*  --  1.7  --   --  1.7  PHOS  --   --  1.8*  --  1.4*  --  3.6  --   --  3.5   < > = values in this interval not displayed.   Estimated Creatinine Clearance: 79.1 mL/min (by C-G formula based on SCr of 0.73 mg/dL).  LIVER Recent Labs  Lab 05/25/20 0500 05/31/20 0422  AST 35 35  ALT 27 34  ALKPHOS 47 45  BILITOT 0.4 0.5  PROT 7.2 7.3  ALBUMIN 2.6* 2.6*     INFECTIOUS Recent Labs  Lab 05/29/20 0547 05/30/20 0748 05/31/20 0422  PROCALCITON 0.38 1.70 3.67  ENDOCRINE CBG (last 3)  Recent Labs    05/30/20 2318 05/31/20 0310 05/31/20 0735  GLUCAP 157* 107* 95   CC time: n/a   Canary Brim, MSN, NP-C, AGACNP-BC [facility] 05/31/2020, 9:34 AM   Please see [email or web address] for pager details.

## 2020-05-31 NOTE — Progress Notes (Signed)
Mother Meryl Dare) updated on patients transfer to rehab.  Mother hopeful to meet with social work at some point to initiate the process for medicaid. Have asked them to visit with patients mother.   Canary Brim, MSN, NP-C, AGACNP-BC [facility] 05/31/2020, 4:01 PM   Please see [email or web address] for pager details.

## 2020-05-31 NOTE — Discharge Summary (Signed)
Physician Discharge Summary  Patient ID: Donna Short MRN: [id number] DOB/AGE: 24-Mar-1991 29 y.o.  Admit date: 05/10/2020 Discharge date: 05/31/2020    Discharge Diagnoses:  ETOH Withdrawal  Acute on Chronic Pancreatitis  PRES  Acute Metabolic Encephalopathy  Acute Respiratory Failure secondary to PRES  Fever  Staphylococcal Tracheal Aspirate  Hypertension  Macrocytic Normochromic Anemia  AKI                                                                     DISCHARGE PLAN BY DIAGNOSIS      ETOH Withdrawal   Discharge Plan: Resolved, no acute follow up at discharge ETOH cessation counseling when able   Acute on Chronic Pancreatitis  ETOH related, based on CT findings with lipase 101  Discharge Plan: -supportive care -TF per Nutrition  -follow clinically   PRES  Acute Metabolic Encephalopathy  Recurrence with seizures 9/19. Confirmed on MRI 9/20.  Worse compared to January 2021 imaging.  Status post burst suppression with continuous EEG.  Discharge Plan: -SBP goal <140 -continue keppra, dilantin, phenobarbital  -follow neuro exam  -PT efforts / out of bed -consider repeat phenobarbital level week of 10/14 with next phenytoin level  -PRN haldol for agitation  -delirium prevention measures  -wean enteral agents as able, monitor for oversedation -see medications as below  Acute Respiratory Failure secondary to PRES  Intubated for burst suppression 9/20.  Had swollen vocal cords on reintubation 9/30, treated with dexamethasone.  Extubated 10/1 with similar issue / stridor from swollen cords requiring reintubation and ultimately trach on 10/4. Progressed to ATC on 10/6 and has tolerated for 24 hours.   Discharge Plan: -ATC as tolerated -wean O2 for sats >90% -PRVC 8cc/kg as rest mode if needed  -clip trach sutures 10/12 -trach care per protocol  -continue SLP efforts for PMV, swallowing  -intermittent CXR  Fever  Staphylococcal Tracheal Aspirate   Temp spike 10/5, possible retrocardiac infiltrate on CXR.  PCT elevated. Tracheal aspirate with staph.   Discharge Plan: -follow cultures to maturity  -continue vancomycin, ceftriaxone for now -de-escalate abx as appropriate once cultures returned   Hypertension   Discharge Plan: -SBP goal <140 -continue lisinopril, metoprolol, amlodipine, clonidine  Macrocytic Normochromic Anemia  Suspect contribution of ETOH abuse, possible B12 deficiency, PPI may complicate if on long term therapy + critical illness   Discharge Plan: -follow Hgb trend -transfuse per guidelines, Hgb <7% or active bleeding  -continue thiamine, MVI, folate, B12                    DISCHARGE SUMMARY    Donna Short is a 29 y.o. female with a PMHx of EtOH abuse, HTN, Seizures, Depression, & Pancreatitis admitted to [facility] 9/17 for acute on chronic pancreatitis and EtOH withdrawal. During her hospital stay she developed uncontrolled HTN and breakthrough seizure activity with MRI findings consistent with PRES. Hospitalization complicated by multiple extubation attempts with stridor in the setting of airway swelling, hypoglycemia, difficult to control hypertension, and prolonged ventilator needs with high dose sedation in the setting of burst suppression.  The patient ultimately required tracheostomy placement on 10/4.  She was weaned to ATC 28% 10/6. Fever noted on 10/5 with empiric vancomycin and cefepime initiated (cefepime de-escalated to  ceftriaxone).  Tracheal aspirate notable for staph, sensitivities pending at time of discharge.  Please seen events below for detailed summary of hospitalization.    [place] EVENTS 9/16 Admit 9/18 Breakthrough seizure 9/19 Breakthrough seizure  9/20 MRI showing PRES 9/21 Hypoglycemic episode with BG 61 and shaking overnight consistent with previous episodes past 2 days. Symptoms responded well to 8 oz of juice and BG improved to 72 within 30 minutes. Likely due to  poor oral intake intubation for burst suppression 9/22 Intubated yesterday for burst suppressio needs -> post intubation hypothermic/then febrile 102F with hypotension/pressor need. Off cleviprex -> this morning off pressors.  Continued hypoglycemia -> needing more D5. Not yet on TF. Making urine. Neuro Dr Rory Percy concerned about recurrent PRES (milder in Jan 2021). Wants to consider LP if fever persists and culture negative. Currently on diprivan gtt and MAP at goal - 80-90 9/23 EEG with continued PLED per neuro. Versed increaed from 57m/h to 474mh. MAx diprivan. On keppra On neop. On d5 normal saline. On TF. 40% fio2 on vent. ECHO normal. No DVT on duplex. Afebrile. MAP at 90 at goal. No labs in results today. Autoimmune sent yestserday. UDS Sent yesterday. Off cleviprex 9/24 On 40% FiO2.  On low-dose Neo-Synephrine to keep the MAP between 80 and 90.  Not needing Cleviprex.  Has increased Versed needs.  Currently on Versed 90 mg/h and propofol 90 mcg.  Autoimmune profile negative except for weak positive ANA.  Has hypoglycemia despite tube feeds.  Started on D10 -infusion 9/25 Remains on 40% oxygen and PEEP of 5.  On the ventilator.  RASS sedation score -5.  Propofol has been stopped because of increased triglycerides.  Continues on Versed infusion ketamine has been started.  Neuro electrophysiologist -EEG showed improved PLEDs and now replaced by intermittent spikes.  Ongoing encephalopathy present.  Overall EEG significantly improved.  Also on phenobarb and Keppra.  Dilantin still on hold. Currently off neo because mean arterial pressure is automatically between 80 and 90 9/27 Ketamine discontinued and Versed down to [id number] 9/29 Off sedation for 24 hours, extubation trial attempted, mental status not improved, re-intubated and vocal cord edema noted - started on decadron 10/01 Extubated on 10/1 with improved mental status, developed respiratory distress with stridor requiring reintubation again with  notable edema of vocal cords. 10/04 Trach 10/06 ATC  10/07 24 hours ATC   DIAGNOSTIC STUDIES 9/16 Abdominal CT >> findings consistent with acute on chronic pancreatitis predominantly involving the proximal pancreas. No peripancreatic fluid collections are identified. 9/20 EEG >> evidence of epileptogenicity arising fromleft posterior temporal region as well as cortical dysfunction in left hemisphere due to underlying structural abnormality, PRES 9/20 MRI >> patchy/extensive brain edema in a pattern consistent with PRES 9/22 ECHO and duplex >> normal 9/26 CXR stable with lower lung infiltrates L>R  MICRO DATA  SARS 9/16 >> neg Trach 9/23 >> nl flora Urine 9/22 >> neg Blood 9/22 >> not done MRSA PCR 9/20 >> neg Autoimmune and vasculitis panel 9/22 >> ANA weak positive but otherwise neg U Tox 9/22 >> positive benzo (Versed infusion) MRSA PCR 10/5 >> negative Trach aspirate 10/5 >> staph >>  BCx2 10/5 >>   ANTIBIOTICS Aztreoniam 9/22 (empiric sepsis) >> 9/22, Zosyn 9/22 >> 05/19/2020 Cefepime 10/5 >> 10/6 Vancomycin 10/5 [id number], staph tracheal aspirate)>>  Ceftriaxone 10/6 >>   CONSULTS Neurology- consulted 9/20, recommended maintaining strict BP control with MAP goals 80-90, Continue Keppra 1500 mg twice daily,Dilantin 75 mg every 8 hours,phenobarb 65 mg  twice daily(phenobarb level 26.6 on 05/21/2020)  TUBES / LINES ETT 9/21> 9/29, 9/29 > 10/1, 10/1 >> 10/4 PICC 9/22 >> Trach #6 shiley (BQ) >>   Discharge Exam: General: young adult female lying in bed in NAD Neuro: awakens to voice, interacts / follows commands CV: s1s2 rrr, no m/r/g PULM: non-labored on ATC, lungs bilaterally clear  GI: soft/non-tender, TF via cortrak Extremities: warm/dry, no edema   Vitals:   05/31/20 1112 05/31/20 1202 05/31/20 1245 05/31/20 1513  BP: (!) 150/111     Pulse: (!) 112   (!) 110  Resp: (!) 38   (!) 26  Temp:  (!) 102.7 F (39.3 C) (!) 103.7 F (39.8 C) 100.3 F (37.9 C)   TempSrc:  Axillary Oral Oral  SpO2: 92%   93%  Weight:      Height:         Discharge Labs  BMET Recent Labs  Lab 05/29/20 0549 05/29/20 0549 05/30/20 0748 05/30/20 0748 05/30/20 0944 05/30/20 0944 05/30/20 1204 05/30/20 1204 05/30/20 1210 05/31/20 0422  NA 129*   < > 140  --  142  --  132*  --  131* 133*  K 4.8   < > 2.3*   < > <2.0*   < > 4.8   < > 4.8 4.1  CL 92*  --  124*  --  >130*  --  95*  --   --  99  CO2 24  --  12*  --  11*  --  23  --   --  21*  GLUCOSE 144*  --  73  --  63*  --  126*  --   --  136*  BUN 19  --  13  --  11  --  20  --   --  21*  CREATININE 0.73  --  0.40*  --  0.34*  --  0.69  --   --  0.73  CALCIUM 9.3  --  4.8*  --  <4.0*  --  9.2  --   --  9.1  MG  --   --  0.8*  --  0.7*  --  1.7  --   --  1.7  PHOS  --   --  1.8*  --  1.4*  --  3.6  --   --  3.5   < > = values in this interval not displayed.    CBC Recent Labs  Lab 05/29/20 0549 05/29/20 0549 05/30/20 0748 05/30/20 1210 05/31/20 0422  HGB 9.6*   < > 8.8* 8.8* 8.5*  HCT 28.3*   < > 26.6* 26.0* 25.3*  WBC 10.8*  --  9.3  --  7.3  PLT 343  --  279  --  239   < > = values in this interval not displayed.    Anti-Coagulation No results for input(s): INR in the last 168 hours.  Discharge Instructions    Call MD for:  difficulty breathing, headache or visual disturbances   Complete by: As directed    Call MD for:  extreme fatigue   Complete by: As directed    Call MD for:  persistant dizziness or light-headedness   Complete by: As directed    Call MD for:  persistant nausea and vomiting   Complete by: As directed    Call MD for:  severe uncontrolled pain   Complete by: As directed    Call MD for:  temperature >100.4   Complete  by: As directed    Diet - low sodium heart healthy   Complete by: As directed    Discharge instructions   Complete by: As directed    1. Patient transferring to [facility] in [place]. 2. Continue all tubes / lines for  discharge 3. Medication list at discharge is continuing current medications for transfer.  See discharge summary for details 4. Final medication list will be determined at discharge from [facility]  5. Trach care per protocol 6. Tube feeding via Cortrak 7. Follow up with primary MD post discharge from [facility]   Increase activity slowly   Complete by: As directed         Follow-up Information    Orma Flaming, MD Follow up.   Specialty: Family Medicine Contact information: [place] [address] [address] Follow up.   Contact information: [place] [phone]               Allergies as of 05/31/2020      Reactions   Claritin [loratadine] Itching   Peanut-containing Drug Products Other (See Comments)   Benadryl [diphenhydramine Hcl (sleep)] Rash   Latex Rash   Shrimp [shellfish Allergy] Hives, Rash      Medication List    STOP taking these medications   albuterol 108 (90 Base) MCG/ACT inhaler Commonly known as: VENTOLIN HFA Replaced by: albuterol (2.5 MG/3ML) 0.083% nebulizer solution   FLUoxetine 20 MG capsule Commonly known as: PROzac   ibuprofen 600 MG tablet Commonly known as: ADVIL   levETIRAcetam 500 MG tablet Commonly known as: Keppra Replaced by: levETIRAcetam 100 MG/ML solution     TAKE these medications   acetaminophen 325 MG tablet Commonly known as: TYLENOL Place 2 tablets (650 mg total) into feeding tube every 6 (six) hours as needed for mild pain (or Fever >/= 101). What changed:   medication strength  how much to take  how to take this  reasons to take this   albuterol (2.5 MG/3ML) 0.083% nebulizer solution Commonly known as: PROVENTIL Take 3 mLs (2.5 mg total) by nebulization every 6 (six) hours as needed for wheezing or shortness of breath. Replaces: albuterol 108 (90 Base) MCG/ACT inhaler   amLODipine 10 MG tablet Commonly  known as: NORVASC Place 1 tablet (10 mg total) into feeding tube daily.   cefTRIAXone 2 g in sodium chloride 0.9 % 100 mL Inject 2 g into the vein daily. Start taking on: June 01, 2020   chlorhexidine gluconate (MEDLINE KIT) 0.12 % solution Commonly known as: PERIDEX 15 mLs by Mouth Rinse route 2 (two) times daily.   Chlorhexidine Gluconate Cloth 2 % Pads Apply 6 each topically daily.   cloNIDine 0.3 MG tablet Commonly known as: CATAPRES Place 1 tablet (0.3 mg total) into feeding tube every 6 (six) hours.   docusate 50 MG/5ML liquid Commonly known as: COLACE Take 10 mLs (100 mg total) by mouth 2 (two) times daily.   enoxaparin 40 MG/0.4ML injection Commonly known as: LOVENOX Inject 0.4 mLs (40 mg total) into the skin at bedtime.   feeding supplement (PROSource TF) liquid Place 45 mLs into feeding tube 2 (two) times daily.   feeding supplement (VITAL 1.5 CAL) Liqd Place 1,000 mLs into feeding tube continuous.   folic acid 1 MG tablet Commonly known as: FOLVITE Place 1 tablet (1 mg total) into feeding tube daily. Start taking on: June 01, 2020 What changed: how to take this   haloperidol lactate 5 MG/ML injection Commonly known as: HALDOL Inject 0.2-0.8 mLs (1-4 mg total) into the vein every 3 (three) hours as needed (for agitated delirium).   labetalol 5 MG/ML injection Commonly known as: NORMODYNE Inject 2 mLs (10 mg total) into the vein every hour as needed (SBP >160).   levETIRAcetam 100 MG/ML solution Commonly known as: KEPPRA Place 15 mLs (1,500 mg total) into feeding tube every 12 (twelve) hours. Replaces: levETIRAcetam 500 MG tablet   lisinopril 40 MG tablet Commonly known as: ZESTRIL Place 1 tablet (40 mg total) into feeding tube daily. Start taking on: June 01, 2020 What changed:   medication strength  how much to take  how to take this   metoprolol tartrate 100 MG tablet Commonly known as: LOPRESSOR Place 1 tablet (100 mg total) into  feeding tube 2 (two) times daily. What changed:   medication strength  how much to take  how to take this   mouth rinse Liqd solution 15 mLs by Mouth Rinse route 2 (two) times daily.   multivitamin with minerals Tabs tablet Place 1 tablet into feeding tube daily. Start taking on: June 01, 2020   ondansetron 4 MG tablet Commonly known as: ZOFRAN Place 1 tablet (4 mg total) into feeding tube every 6 (six) hours as needed for nausea.   oxyCODONE 5 MG immediate release tablet Commonly known as: Oxy IR/ROXICODONE Place 1 tablet (5 mg total) into feeding tube every 6 (six) hours.   pantoprazole sodium 40 mg/20 mL Pack Commonly known as: PROTONIX Place 20 mLs (40 mg total) into feeding tube at bedtime.   PHENobarbital 64.8 MG tablet Commonly known as: LUMINAL Take 1 tablet (64.8 mg total) by mouth 2 (two) times daily.   phenytoin 125 MG/5ML suspension Commonly known as: DILANTIN Place 3 mLs (75 mg total) into feeding tube every 8 (eight) hours.   polyethylene glycol 17 g packet Commonly known as: MIRALAX / GLYCOLAX Take 17 g by mouth daily. Start taking on: June 01, 2020   QUEtiapine 25 MG tablet Commonly known as: SEROQUEL Place 1 tablet (25 mg total) into feeding tube at bedtime.   sodium chloride 0.9 % infusion Inject 250 mLs into the vein continuous.   sodium chloride flush 0.9 % Soln Commonly known as: NS 10-40 mLs by Intracatheter route every 12 (twelve) hours.   thiamine 100 MG tablet Place 1 tablet (100 mg total) into feeding tube daily. Start taking on: June 01, 2020   vancomycin 750 MG/150ML Soln Commonly known as: VANCOREADY Inject 150 mLs (750 mg total) into the vein every 12 (twelve) hours.   vitamin B-12 500 MCG tablet Commonly known as: CYANOCOBALAMIN Place 1 tablet (500 mcg total) into feeding tube daily. Start taking on: June 01, 2020         Disposition: [facility]   Discharged Condition: Donna Short has met maximum benefit of inpatient care and is medically stable and cleared for discharge.  Patient is pending follow up as above.      Time spent on disposition:  Greater than 35 Minutes.   Signed: Noe Gens, NP-C [facility] Pgr: [phone] Office: [phone]

## 2020-05-31 NOTE — TOC Transition Note (Addendum)
Transition of Care [facility]) - CM/SW Discharge Note   Patient Details  Name: Donna Short MRN: [id number] Date of Birth: 05/02/1991  [facility] CM/SW Contact:  Nance Pear, RN Phone Number: 05/31/2020, 2:39 PM   Clinical Narrative:    Bed opening at [facility].  Messaged Dr. Kendrick Fries to see if patient was medically ready for [facility] and he verified that patient is.  Called mother, Tyler Pita, to offer choice of LTACH facilities.  Mother is okay with transfer to [facility] after discussion of needs and plan of care.  Notified [facility], [facility], and MD is completing discharge orders.   Final next level of care: Long Term Acute Care (LTAC) Barriers to Discharge: No Barriers Identified   Patient Goals and CMS Choice     Choice offered to / list presented to : Parent  Discharge Placement                       Discharge Plan and Services In-house Referral: Clinical Social Work Discharge Planning Services: CM Consult Post Acute Care Choice: Long Term Acute Care (LTAC)                               Social Determinants of Health (SDOH) Interventions     Readmission Risk Interventions No flowsheet data found.

## 2020-05-31 NOTE — Progress Notes (Signed)
PRN haldol added for agitation.  Will follow.   Canary Brim, MSN, NP-C, AGACNP-BC [facility] 05/31/2020, 11:54 AM   Please see [email or web address] for pager details.

## 2020-06-01 ENCOUNTER — Other Ambulatory Visit (HOSPITAL_COMMUNITY): Payer: Self-pay

## 2020-06-01 LAB — CBC WITH DIFFERENTIAL/PLATELET
Abs Immature Granulocytes: 0.01 10*3/uL (ref 0.00–0.07)
Basophils Absolute: 0 10*3/uL (ref 0.0–0.1)
Basophils Relative: 0 %
Eosinophils Absolute: 0.2 10*3/uL (ref 0.0–0.5)
Eosinophils Relative: 5 %
HCT: 31.1 % — ABNORMAL LOW (ref 36.0–46.0)
Hemoglobin: 10.3 g/dL — ABNORMAL LOW (ref 12.0–15.0)
Immature Granulocytes: 0 %
Lymphocytes Relative: 22 %
Lymphs Abs: 0.9 10*3/uL (ref 0.7–4.0)
MCH: 33.4 pg (ref 26.0–34.0)
MCHC: 33.1 g/dL (ref 30.0–36.0)
MCV: 101 fL — ABNORMAL HIGH (ref 80.0–100.0)
Monocytes Absolute: 0.7 10*3/uL (ref 0.1–1.0)
Monocytes Relative: 17 %
Neutro Abs: 2.4 10*3/uL (ref 1.7–7.7)
Neutrophils Relative %: 56 %
Platelets: 257 10*3/uL (ref 150–400)
RBC: 3.08 MIL/uL — ABNORMAL LOW (ref 3.87–5.11)
RDW: 11.4 % — ABNORMAL LOW (ref 11.5–15.5)
WBC: 4.2 10*3/uL (ref 4.0–10.5)
nRBC: 0 % (ref 0.0–0.2)

## 2020-06-01 LAB — CULTURE, RESPIRATORY W GRAM STAIN

## 2020-06-01 LAB — URINALYSIS, ROUTINE W REFLEX MICROSCOPIC
Bacteria, UA: NONE SEEN
Bilirubin Urine: NEGATIVE
Glucose, UA: NEGATIVE mg/dL
Ketones, ur: NEGATIVE mg/dL
Leukocytes,Ua: NEGATIVE
Nitrite: NEGATIVE
Protein, ur: 100 mg/dL — AB
RBC / HPF: >50 RBC/hpf — ABNORMAL HIGH (ref 0–5)
Specific Gravity, Urine: 1.012 (ref 1.005–1.030)
pH: 6 (ref 5.0–8.0)

## 2020-06-01 LAB — COMPREHENSIVE METABOLIC PANEL
ALT: 41 U/L (ref 0–44)
AST: 53 U/L — ABNORMAL HIGH (ref 15–41)
Albumin: 3 g/dL — ABNORMAL LOW (ref 3.5–5.0)
Alkaline Phosphatase: 49 U/L (ref 38–126)
Anion gap: 15 (ref 5–15)
BUN: 19 mg/dL (ref 6–20)
CO2: 18 mmol/L — ABNORMAL LOW (ref 22–32)
Calcium: 9.7 mg/dL (ref 8.9–10.3)
Chloride: 101 mmol/L (ref 98–111)
Creatinine, Ser: 0.75 mg/dL (ref 0.44–1.00)
GFR calc non Af Amer: >60 mL/min (ref >60)
Glucose, Bld: 118 mg/dL — ABNORMAL HIGH (ref 70–99)
Potassium: 4.5 mmol/L (ref 3.5–5.1)
Sodium: 134 mmol/L — ABNORMAL LOW (ref 135–145)
Total Bilirubin: 0.5 mg/dL (ref 0.3–1.2)
Total Protein: 8.5 g/dL — ABNORMAL HIGH (ref 6.5–8.1)

## 2020-06-01 LAB — PHOSPHORUS: Phosphorus: 4.6 mg/dL (ref 2.5–4.6)

## 2020-06-01 LAB — PROTIME-INR
INR: 1 (ref 0.8–1.2)
Prothrombin Time: 13 seconds (ref 11.4–15.2)

## 2020-06-01 LAB — MAGNESIUM: Magnesium: 1.8 mg/dL (ref 1.7–2.4)

## 2020-06-01 LAB — VANCOMYCIN, TROUGH
Vancomycin Tr: 36 ug/mL (ref 15–20)
Vancomycin Tr: 5 ug/mL — ABNORMAL LOW (ref 15–20)

## 2020-06-02 LAB — URINE CULTURE: Culture: NO GROWTH

## 2020-06-02 LAB — RENAL FUNCTION PANEL
Albumin: 3 g/dL — ABNORMAL LOW (ref 3.5–5.0)
Anion gap: 15 (ref 5–15)
BUN: 16 mg/dL (ref 6–20)
CO2: 26 mmol/L (ref 22–32)
Calcium: 9.9 mg/dL (ref 8.9–10.3)
Chloride: 92 mmol/L — ABNORMAL LOW (ref 98–111)
Creatinine, Ser: 0.71 mg/dL (ref 0.44–1.00)
GFR, Estimated: >60 mL/min (ref >60)
Glucose, Bld: 99 mg/dL (ref 70–99)
Phosphorus: 4.7 mg/dL — ABNORMAL HIGH (ref 2.5–4.6)
Potassium: 4.2 mmol/L (ref 3.5–5.1)
Sodium: 133 mmol/L — ABNORMAL LOW (ref 135–145)

## 2020-06-02 LAB — PHENYTOIN LEVEL, TOTAL: Phenytoin Lvl: 5.1 ug/mL — ABNORMAL LOW (ref 10.0–20.0)

## 2020-06-02 LAB — PHENOBARBITAL LEVEL: Phenobarbital: 27.9 ug/mL (ref 15.0–30.0)

## 2020-06-03 LAB — CULTURE, BLOOD (ROUTINE X 2)
Culture: NO GROWTH
Culture: NO GROWTH

## 2020-06-03 LAB — CBC
HCT: 28.7 % — ABNORMAL LOW (ref 36.0–46.0)
Hemoglobin: 9.8 g/dL — ABNORMAL LOW (ref 12.0–15.0)
MCH: 33.4 pg (ref 26.0–34.0)
MCHC: 34.1 g/dL (ref 30.0–36.0)
MCV: 98 fL (ref 80.0–100.0)
Platelets: 275 10*3/uL (ref 150–400)
RBC: 2.93 MIL/uL — ABNORMAL LOW (ref 3.87–5.11)
RDW: 11.3 % — ABNORMAL LOW (ref 11.5–15.5)
WBC: 3.5 10*3/uL — ABNORMAL LOW (ref 4.0–10.5)
nRBC: 0 % (ref 0.0–0.2)

## 2020-06-03 LAB — BASIC METABOLIC PANEL
Anion gap: 13 (ref 5–15)
BUN: 15 mg/dL (ref 6–20)
CO2: 25 mmol/L (ref 22–32)
Calcium: 9.2 mg/dL (ref 8.9–10.3)
Chloride: 95 mmol/L — ABNORMAL LOW (ref 98–111)
Creatinine, Ser: 0.65 mg/dL (ref 0.44–1.00)
GFR, Estimated: >60 mL/min (ref >60)
Glucose, Bld: 96 mg/dL (ref 70–99)
Potassium: 4.7 mmol/L (ref 3.5–5.1)
Sodium: 133 mmol/L — ABNORMAL LOW (ref 135–145)

## 2020-06-03 LAB — VANCOMYCIN, TROUGH: Vancomycin Tr: 12 ug/mL — ABNORMAL LOW (ref 15–20)

## 2020-06-03 LAB — LIPASE, BLOOD: Lipase: 24 U/L (ref 11–51)

## 2020-06-04 DIAGNOSIS — G40901 Epilepsy, unspecified, not intractable, with status epilepticus: Secondary | ICD-10-CM

## 2020-06-04 DIAGNOSIS — J45909 Unspecified asthma, uncomplicated: Secondary | ICD-10-CM

## 2020-06-04 DIAGNOSIS — F1010 Alcohol abuse, uncomplicated: Secondary | ICD-10-CM

## 2020-06-04 DIAGNOSIS — J9621 Acute and chronic respiratory failure with hypoxia: Secondary | ICD-10-CM

## 2020-06-04 DIAGNOSIS — G9341 Metabolic encephalopathy: Secondary | ICD-10-CM

## 2020-06-04 LAB — CBC
HCT: 30.2 % — ABNORMAL LOW (ref 36.0–46.0)
Hemoglobin: 10.1 g/dL — ABNORMAL LOW (ref 12.0–15.0)
MCH: 32.9 pg (ref 26.0–34.0)
MCHC: 33.4 g/dL (ref 30.0–36.0)
MCV: 98.4 fL (ref 80.0–100.0)
Platelets: 302 10*3/uL (ref 150–400)
RBC: 3.07 MIL/uL — ABNORMAL LOW (ref 3.87–5.11)
RDW: 11.4 % — ABNORMAL LOW (ref 11.5–15.5)
WBC: 3.6 10*3/uL — ABNORMAL LOW (ref 4.0–10.5)
nRBC: 0 % (ref 0.0–0.2)

## 2020-06-04 LAB — BASIC METABOLIC PANEL
Anion gap: 13 (ref 5–15)
BUN: 13 mg/dL (ref 6–20)
CO2: 25 mmol/L (ref 22–32)
Calcium: 9.6 mg/dL (ref 8.9–10.3)
Chloride: 98 mmol/L (ref 98–111)
Creatinine, Ser: 0.61 mg/dL (ref 0.44–1.00)
GFR, Estimated: >60 mL/min (ref >60)
Glucose, Bld: 105 mg/dL — ABNORMAL HIGH (ref 70–99)
Potassium: 4.2 mmol/L (ref 3.5–5.1)
Sodium: 136 mmol/L (ref 135–145)

## 2020-06-04 LAB — MAGNESIUM: Magnesium: 1.5 mg/dL — ABNORMAL LOW (ref 1.7–2.4)

## 2020-06-04 NOTE — Consult Note (Signed)
Pulmonary Critical Care Medicine [facility] [place]  PULMONARY SERVICE  Date of Service: 06/04/2020  PULMONARY CRITICAL CARE CONSULT   Donna Short  AXK:[id number]  DOB: 05-25-1991   DOA: 05/31/2020  Referring Physician: Carron Curie, MD  HPI: Donna Short is a 29 y.o. female seen for follow up of Acute on Chronic Respiratory Failure.  Patient has multiple medical problems including chronic asthma depression hypertension migraines seizure disorder pancreatitis came into the hospital with acute on chronic pancreatitis.  Patient also had been drinking heavily.  The patient did undergo alcohol withdrawal also apparently suffered seizures and pres with metabolic encephalopathy.  The patient had to be intubated for burst suppression on September 20 patient did have significant swollen vocal cords after extubation and had to be reintubated on September 30.  Was given steroids extubated on October 1 with same problems with stridor so therefore was reintubated.  Patient is here for weaning now and ongoing medical management.  Review of Systems:  ROS performed and is unremarkable other than noted above.  Past Medical History:  Diagnosis Date  . Asthma   . Depression   . History of chicken pox   . Hypertension   . Migraines   . Pancreatitis 01/2018  . Seizures (HCC)     Past Surgical History:  Procedure Laterality Date  . WISDOM TOOTH EXTRACTION      Social History:    reports that she quit smoking about 2 years ago. Her smoking use included cigarettes. She has never used smokeless tobacco. She reports current alcohol use of about 9.0 standard drinks of alcohol per week. She reports that she does not use drugs.  Family History: Non-Contributory to the present illness  Allergies  Allergen Reactions  . Claritin [Loratadine] Itching  . Peanut-Containing Drug Products Other (See Comments)  . Benadryl [Diphenhydramine Hcl (Sleep)] Rash  . Latex Rash  . Shrimp  [Shellfish Allergy] Hives and Rash    Medications: Reviewed on Rounds  Physical Exam:  Vitals: Temperature 99.9 pulse 87 respiratory rate 17 blood pressure is 130/93 saturations 100%  Ventilator Settings on T collar with an FiO2 of 28% now  . General: Comfortable at this time . Eyes: Grossly normal lids, irises & conjunctiva . ENT: grossly tongue is normal . Neck: no obvious mass . Cardiovascular: S1-S2 normal no gallop or rub . Respiratory: Scattered rhonchi expansion is equal . Abdomen: Soft and nontender . Skin: no rash seen on limited exam . Musculoskeletal: not rigid . Psychiatric:unable to assess . Neurologic: no seizure no involuntary movements         Labs on Admission:  Basic Metabolic Panel: Recent Labs  Lab 05/30/20 0944 05/30/20 0944 05/30/20 1204 05/30/20 1210 05/31/20 0422 06/01/20 0134 06/02/20 0455 06/03/20 0331 06/04/20 0802  NA 142   < > 132*   < > 133* 134* 133* 133* 136  K <2.0*   < > 4.8   < > 4.1 4.5 4.2 4.7 4.2  CL >130*   < > 95*   < > 99 101 92* 95* 98  CO2 11*   < > 23   < > 21* 18* 26 25 25   GLUCOSE 63*   < > 126*   < > 136* 118* 99 96 105*  BUN 11   < > 20   < > 21* 19 16 15 13   CREATININE 0.34*   < > 0.69   < > 0.73 0.75 0.71 0.65 0.61  CALCIUM <4.0*   < >  9.2   < > 9.1 9.7 9.9 9.2 9.6  MG 0.7*  --  1.7  --  1.7 1.8  --   --  1.5*  PHOS 1.4*  --  3.6  --  3.5 4.6 4.7*  --   --    < > = values in this interval not displayed.    Recent Labs  Lab 05/30/20 1210  PHART 7.465*  PCO2ART 35.3  PO2ART 158*  HCO3 25.1  O2SAT 99.0    Liver Function Tests: Recent Labs  Lab 05/31/20 0422 06/01/20 0134 06/02/20 0455  AST 35 53*  --   ALT 34 41  --   ALKPHOS 45 49  --   BILITOT 0.5 0.5  --   PROT 7.3 8.5*  --   ALBUMIN 2.6* 3.0* 3.0*   Recent Labs  Lab 06/03/20 0331  LIPASE 24   No results for input(s): AMMONIA in the last 168 hours.  CBC: Recent Labs  Lab 05/29/20 0549 05/29/20 0549 05/30/20 0748 05/30/20 0748  05/30/20 1210 05/31/20 0422 06/01/20 0134 06/03/20 0331 06/04/20 0802  WBC 10.8*   < > 9.3  --   --  7.3 4.2 3.5* 3.6*  NEUTROABS 8.6*  --  6.9  --   --  4.8 2.4  --   --   HGB 9.6*   < > 8.8*   < > 8.8* 8.5* 10.3* 9.8* 10.1*  HCT 28.3*   < > 26.6*   < > 26.0* 25.3* 31.1* 28.7* 30.2*  MCV 100.7*   < > 100.8*  --   --  100.4* 101.0* 98.0 98.4  PLT 343   < > 279  --   --  239 257 275 302   < > = values in this interval not displayed.    Cardiac Enzymes: No results for input(s): CKTOTAL, CKMB, CKMBINDEX, TROPONINI in the last 168 hours.  BNP (last 3 results) No results for input(s): BNP in the last 8760 hours.  ProBNP (last 3 results) No results for input(s): PROBNP in the last 8760 hours.   Radiological Exams on Admission: DG CHEST PORT 1 VIEW  Result Date: 06/01/2020 CLINICAL DATA:  Respiratory failure EXAM: PORTABLE CHEST 1 VIEW COMPARISON:  Yesterday FINDINGS: Cardiomegaly. Tracheostomy tube in place. Feeding tube with tip at the distal stomach. Right PICC with tip at the upper cavoatrial junction. The lungs are clear. No visible effusion or pneumothorax IMPRESSION: Stable hardware positioning and aeration. Electronically Signed   By: Marnee Spring M.D.   On: 06/01/2020 05:52   DG Abd Portable 1V  Result Date: 05/31/2020 CLINICAL DATA:  NG tube placement EXAM: PORTABLE ABDOMEN - 1 VIEW COMPARISON:  05/27/2020 FINDINGS: The positioning of the enteric tube is essentially unchanged from prior. The tube projects over the level of the distal stomach/pylorus. The tube is not post pyloric. The bowel gas pattern is unremarkable where visualized. IMPRESSION: Enteric tube projects over the distal stomach/pylorus. Electronically Signed   By: Katherine Mantle M.D.   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 Acute on chronic respiratory failure with hypoxia patient currently is on T collar weaning plan is going to be to continue with the T collar weaning as ordered.  Patient may still have swelling.  She apparently was attempted on  PMV did not tolerate it.  We may need to do an upper airways examination. 2. Metabolic encephalopathy supportive care clearing pattern although very very slow. 3. Seizure disorder patient has been on Keppra which will be continued 4. Alcohol withdrawal has undergone withdrawal we will continue to monitor.  She does have as needed agitation and requires Haldol 5. Chronic obstructive asthma medical management nebulizers as necessary we will continue with supportive care  I have personally seen and evaluated the patient, evaluated laboratory and imaging results, formulated the assessment and plan and placed orders. The Patient requires high complexity decision making with multiple systems involvement.  Case was discussed on Rounds with the Respiratory Therapy Director and the Respiratory staff Time Spent  Yevonne Pax, MD [facility] Pulmonary Critical Care Medicine Sleep Medicine

## 2020-06-05 LAB — LEVETIRACETAM LEVEL: Levetiracetam Lvl: 17.8 ug/mL (ref 10.0–40.0)

## 2020-06-05 LAB — MAGNESIUM: Magnesium: 1.3 mg/dL — ABNORMAL LOW (ref 1.7–2.4)

## 2020-06-05 NOTE — Progress Notes (Addendum)
Pulmonary Critical Care Medicine [facility] [place]   PULMONARY CRITICAL CARE SERVICE  PROGRESS NOTE  Date of Service: 06/05/2020  RENAI Short  BHA:[id number]  DOB: 01/27/1991   DOA: 05/31/2020  Referring Physician: Carron Curie, MD  HPI: Donna Short is a 29 y.o. female seen for follow up of Acute on Chronic Respiratory Failure.  Patient is currently on 20% aerosol trach collar no fever or distress noted.  Medications: Reviewed on Rounds  Physical Exam:  Vitals: Pulse 105 respirations 22 BP 113/74 O2 sat 100% temp 97.9  Ventilator Settings 28% ATC  . General: Comfortable at this time . Eyes: Grossly normal lids, irises & conjunctiva . ENT: grossly tongue is normal . Neck: no obvious mass . Cardiovascular: S1 S2 normal no gallop . Respiratory: No rales or rhonchi noted . Abdomen: soft . Skin: no rash seen on limited exam . Musculoskeletal: not rigid . Psychiatric:unable to assess . Neurologic: no seizure no involuntary movements         Lab Data:   Basic Metabolic Panel: Recent Labs  Lab 05/30/20 0944 05/30/20 0944 05/30/20 1204 05/30/20 1210 05/31/20 0422 06/01/20 0134 06/02/20 0455 06/03/20 0331 06/04/20 0802 06/05/20 0632  NA 142   < > 132*   < > 133* 134* 133* 133* 136  --   K <2.0*   < > 4.8   < > 4.1 4.5 4.2 4.7 4.2  --   CL >130*   < > 95*   < > 99 101 92* 95* 98  --   CO2 11*   < > 23   < > 21* 18* 26 25 25   --   GLUCOSE 63*   < > 126*   < > 136* 118* 99 96 105*  --   BUN 11   < > 20   < > 21* 19 16 15 13   --   CREATININE 0.34*   < > 0.69   < > 0.73 0.75 0.71 0.65 0.61  --   CALCIUM <4.0*   < > 9.2   < > 9.1 9.7 9.9 9.2 9.6  --   MG 0.7*   < > 1.7  --  1.7 1.8  --   --  1.5* 1.3*  PHOS 1.4*  --  3.6  --  3.5 4.6 4.7*  --   --   --    < > = values in this interval not displayed.    ABG: Recent Labs  Lab 05/30/20 1210  PHART 7.465*  PCO2ART 35.3  PO2ART 158*  HCO3 25.1  O2SAT 99.0    Liver Function Tests: Recent  Labs  Lab 05/31/20 0422 06/01/20 0134 06/02/20 0455  AST 35 53*  --   ALT 34 41  --   ALKPHOS 45 49  --   BILITOT 0.5 0.5  --   PROT 7.3 8.5*  --   ALBUMIN 2.6* 3.0* 3.0*   Recent Labs  Lab 06/03/20 0331  LIPASE 24   No results for input(s): AMMONIA in the last 168 hours.  CBC: Recent Labs  Lab 05/30/20 0748 05/30/20 0748 05/30/20 1210 05/31/20 0422 06/01/20 0134 06/03/20 0331 06/04/20 0802  WBC 9.3  --   --  7.3 4.2 3.5* 3.6*  NEUTROABS 6.9  --   --  4.8 2.4  --   --   HGB 8.8*   < > 8.8* 8.5* 10.3* 9.8* 10.1*  HCT 26.6*   < > 26.0* 25.3* 31.1* 28.7* 30.2*  MCV 100.8*  --   --  100.4* 101.0* 98.0 98.4  PLT 279  --   --  239 257 275 302   < > = values in this interval not displayed.    Cardiac Enzymes: No results for input(s): CKTOTAL, CKMB, CKMBINDEX, TROPONINI in the last 168 hours.  BNP (last 3 results) No results for input(s): BNP in the last 8760 hours.  ProBNP (last 3 results) No results for input(s): PROBNP in the last 8760 hours.  Radiological Exams: No results found.  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 Acute on chronic respiratory failure with hypoxia patient continue weaning on T collar currently 28% FiO2.  We will continue aggressive pulmonary toilet supportive measures. 2. Metabolic encephalopathy supportive care clearing pattern although very very slow. 3. Seizure disorder patient has been on Keppra which will be continued 4. Alcohol withdrawal has undergone withdrawal we will continue to monitor.  She does have as needed agitation and requires Haldol 5. Chronic obstructive asthma medical management nebulizers as necessary we will continue with supportive care   I have personally seen and evaluated the patient, evaluated laboratory and imaging  results, formulated the assessment and plan and placed orders. The Patient requires high complexity decision making with multiple systems involvement.  Rounds were done with the Respiratory Therapy Director and Staff therapists and discussed with nursing staff also.  Yevonne Pax, MD [facility] Pulmonary Critical Care Medicine Sleep Medicine

## 2020-06-06 LAB — CBC
HCT: 30.2 % — ABNORMAL LOW (ref 36.0–46.0)
Hemoglobin: 9.9 g/dL — ABNORMAL LOW (ref 12.0–15.0)
MCH: 32.6 pg (ref 26.0–34.0)
MCHC: 32.8 g/dL (ref 30.0–36.0)
MCV: 99.3 fL (ref 80.0–100.0)
Platelets: 360 10*3/uL (ref 150–400)
RBC: 3.04 MIL/uL — ABNORMAL LOW (ref 3.87–5.11)
RDW: 11.6 % (ref 11.5–15.5)
WBC: 4 10*3/uL (ref 4.0–10.5)
nRBC: 0 % (ref 0.0–0.2)

## 2020-06-06 LAB — BASIC METABOLIC PANEL
Anion gap: 10 (ref 5–15)
BUN: 13 mg/dL (ref 6–20)
CO2: 23 mmol/L (ref 22–32)
Calcium: 9.3 mg/dL (ref 8.9–10.3)
Chloride: 101 mmol/L (ref 98–111)
Creatinine, Ser: 0.56 mg/dL (ref 0.44–1.00)
GFR, Estimated: >60 mL/min (ref >60)
Glucose, Bld: 106 mg/dL — ABNORMAL HIGH (ref 70–99)
Potassium: 4.2 mmol/L (ref 3.5–5.1)
Sodium: 134 mmol/L — ABNORMAL LOW (ref 135–145)

## 2020-06-06 LAB — PHOSPHORUS: Phosphorus: 4.5 mg/dL (ref 2.5–4.6)

## 2020-06-06 LAB — MAGNESIUM: Magnesium: 2.8 mg/dL — ABNORMAL HIGH (ref 1.7–2.4)

## 2020-06-06 NOTE — Progress Notes (Addendum)
Pulmonary Critical Care Medicine [facility] [place]   PULMONARY CRITICAL CARE SERVICE  PROGRESS NOTE  Date of Service: 06/06/2020  Donna Short  WGY:[id number]  DOB: Oct 01, 1990   DOA: 05/31/2020  Referring Physician: Carron Curie, MD  HPI: Donna Short is a 29 y.o. female seen for follow up of Acute on Chronic Respiratory Failure.  Patient remains on 21% aerosol trach collar satting well at this time with no fever or distress.  Medications: Reviewed on Rounds  Physical Exam:  Vitals: Pulse 84 respirations 25 BP 112/68 O2 sat 100% temp 97.2  Ventilator Settings not currently on ventilator  . General: Comfortable at this time . Eyes: Grossly normal lids, irises & conjunctiva . ENT: grossly tongue is normal . Neck: no obvious mass . Cardiovascular: S1 S2 normal no gallop . Respiratory: No rales or rhonchi noted . Abdomen: soft . Skin: no rash seen on limited exam . Musculoskeletal: not rigid . Psychiatric:unable to assess . Neurologic: no seizure no involuntary movements         Lab Data:   Basic Metabolic Panel: Recent Labs  Lab 05/30/20 1204 05/30/20 1210 05/31/20 0422 05/31/20 0422 06/01/20 0134 06/02/20 0455 06/03/20 0331 06/04/20 0802 06/05/20 7793 06/06/20 0426  NA 132*   < > 133*   < > 134* 133* 133* 136  --  134*  K 4.8   < > 4.1   < > 4.5 4.2 4.7 4.2  --  4.2  CL 95*   < > 99   < > 101 92* 95* 98  --  101  CO2 23   < > 21*   < > 18* 26 25 25   --  23  GLUCOSE 126*   < > 136*   < > 118* 99 96 105*  --  106*  BUN 20   < > 21*   < > 19 16 15 13   --  13  CREATININE 0.69   < > 0.73   < > 0.75 0.71 0.65 0.61  --  0.56  CALCIUM 9.2   < > 9.1   < > 9.7 9.9 9.2 9.6  --  9.3  MG 1.7   < > 1.7  --  1.8  --   --  1.5* 1.3* 2.8*  PHOS 3.6  --  3.5  --  4.6 4.7*  --   --   --  4.5   < > = values in this interval not displayed.    ABG: Recent Labs  Lab 05/30/20 1210  PHART 7.465*  PCO2ART 35.3  PO2ART 158*  HCO3 25.1  O2SAT 99.0     Liver Function Tests: Recent Labs  Lab 05/31/20 0422 06/01/20 0134 06/02/20 0455  AST 35 53*  --   ALT 34 41  --   ALKPHOS 45 49  --   BILITOT 0.5 0.5  --   PROT 7.3 8.5*  --   ALBUMIN 2.6* 3.0* 3.0*   Recent Labs  Lab 06/03/20 0331  LIPASE 24   No results for input(s): AMMONIA in the last 168 hours.  CBC: Recent Labs  Lab 05/31/20 0422 06/01/20 0134 06/03/20 0331 06/04/20 0802 06/06/20 0426  WBC 7.3 4.2 3.5* 3.6* 4.0  NEUTROABS 4.8 2.4  --   --   --   HGB 8.5* 10.3* 9.8* 10.1* 9.9*  HCT 25.3* 31.1* 28.7* 30.2* 30.2*  MCV 100.4* 101.0* 98.0 98.4 99.3  PLT 239 257 275 302 360    Cardiac Enzymes:  No results for input(s): CKTOTAL, CKMB, CKMBINDEX, TROPONINI in the last 168 hours.  BNP (last 3 results) No results for input(s): BNP in the last 8760 hours.  ProBNP (last 3 results) No results for input(s): PROBNP in the last 8760 hours.  Radiological Exams: No results found.  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 Acute on chronic respiratory failure with hypoxia patient continue weaning on T collar currently 21% FiO2.  We will continue aggressive pulmonary toilet supportive measures. 2. Metabolic encephalopathy supportive care clearing pattern although very very slow. 3. Seizure disorder patient has been on Keppra which will be continued 4. Alcohol withdrawal has undergone withdrawal we will continue to monitor. She does have as needed agitation and requires Haldol 5. Chronic obstructive asthma medical management nebulizers as necessary we will continue with supportive care   I have personally seen and evaluated the patient, evaluated laboratory and imaging results, formulated the assessment and plan and placed orders. The Patient requires high complexity decision  making with multiple systems involvement.  Rounds were done with the Respiratory Therapy Director and Staff therapists and discussed with nursing staff also.  Yevonne Pax, MD [facility] Pulmonary Critical Care Medicine Sleep Medicine

## 2020-06-07 LAB — BASIC METABOLIC PANEL
Anion gap: 11 (ref 5–15)
BUN: 15 mg/dL (ref 6–20)
CO2: 23 mmol/L (ref 22–32)
Calcium: 9.5 mg/dL (ref 8.9–10.3)
Chloride: 101 mmol/L (ref 98–111)
Creatinine, Ser: 0.55 mg/dL (ref 0.44–1.00)
GFR, Estimated: >60 mL/min (ref >60)
Glucose, Bld: 102 mg/dL — ABNORMAL HIGH (ref 70–99)
Potassium: 4.3 mmol/L (ref 3.5–5.1)
Sodium: 135 mmol/L (ref 135–145)

## 2020-06-07 LAB — VANCOMYCIN, TROUGH: Vancomycin Tr: 11 ug/mL — ABNORMAL LOW (ref 15–20)

## 2020-06-07 LAB — MAGNESIUM: Magnesium: 1.6 mg/dL — ABNORMAL LOW (ref 1.7–2.4)

## 2020-06-07 NOTE — Progress Notes (Addendum)
Pulmonary Critical Care Medicine [facility] [place]   PULMONARY CRITICAL CARE SERVICE  PROGRESS NOTE  Date of Service: 06/07/2020  Donna Short  VOZ:[id number]  DOB: 1991-07-19   DOA: 05/31/2020  Referring Physician: Carron Curie, MD  HPI: Donna Short is a 29 y.o. female seen for follow up of Acute on Chronic Respiratory Failure.  Patient remains on aerosol trach collar at this time using PMV with no difficulty.  Medications: Reviewed on Rounds  Physical Exam:  Vitals: Pulse 96 respirations 28 BP 135/99 O2 sat 100% temp 98.7  Ventilator Settings not currently on ventilator  . General: Comfortable at this time . Eyes: Grossly normal lids, irises & conjunctiva . ENT: grossly tongue is normal . Neck: no obvious mass . Cardiovascular: S1 S2 normal no gallop . Respiratory: No rales or rhonchi noted . Abdomen: soft . Skin: no rash seen on limited exam . Musculoskeletal: not rigid . Psychiatric:unable to assess . Neurologic: no seizure no involuntary movements         Lab Data:   Basic Metabolic Panel: Recent Labs  Lab 06/01/20 0134 06/01/20 0134 06/02/20 0455 06/03/20 0331 06/04/20 0802 06/05/20 4259 06/06/20 0426 06/07/20 0433  NA 134*   < > 133* 133* 136  --  134* 135  K 4.5   < > 4.2 4.7 4.2  --  4.2 4.3  CL 101   < > 92* 95* 98  --  101 101  CO2 18*   < > 26 25 25   --  23 23  GLUCOSE 118*   < > 99 96 105*  --  106* 102*  BUN 19   < > 16 15 13   --  13 15  CREATININE 0.75   < > 0.71 0.65 0.61  --  0.56 0.55  CALCIUM 9.7   < > 9.9 9.2 9.6  --  9.3 9.5  MG 1.8  --   --   --  1.5* 1.3* 2.8* 1.6*  PHOS 4.6  --  4.7*  --   --   --  4.5  --    < > = values in this interval not displayed.    ABG: No results for input(s): PHART, PCO2ART, PO2ART, HCO3, O2SAT in the last 168 hours.  Liver Function Tests: Recent Labs  Lab 06/01/20 0134 06/02/20 0455  AST 53*  --   ALT 41  --   ALKPHOS 49  --   BILITOT 0.5  --   PROT 8.5*  --   ALBUMIN  3.0* 3.0*   Recent Labs  Lab 06/03/20 0331  LIPASE 24   No results for input(s): AMMONIA in the last 168 hours.  CBC: Recent Labs  Lab 06/01/20 0134 06/03/20 0331 06/04/20 0802 06/06/20 0426  WBC 4.2 3.5* 3.6* 4.0  NEUTROABS 2.4  --   --   --   HGB 10.3* 9.8* 10.1* 9.9*  HCT 31.1* 28.7* 30.2* 30.2*  MCV 101.0* 98.0 98.4 99.3  PLT 257 275 302 360    Cardiac Enzymes: No results for input(s): CKTOTAL, CKMB, CKMBINDEX, TROPONINI in the last 168 hours.  BNP (last 3 results) No results for input(s): BNP in the last 8760 hours.  ProBNP (last 3 results) No results for input(s): PROBNP in the last 8760 hours.  Radiological Exams: No results found.  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 Acute on chronic respiratory failure with hypoxiapatient continue weaning on T collar currently 21% FiO2. We will continue aggressive pulmonary toilet supportive measures. 2. Metabolic encephalopathy supportive care clearing pattern although very very slow. 3. Seizure disorder patient has been on Keppra which will be continued 4. Alcohol withdrawal has undergone withdrawal we will continue to monitor. She does have as needed agitation and requires Haldol 5. Chronic obstructive asthma medical management nebulizers as necessary we will continue with supportive care   I have personally seen and evaluated the patient, evaluated laboratory and imaging results, formulated the assessment and plan and placed orders. The Patient requires high complexity decision making with multiple systems involvement.  Rounds were done with the Respiratory Therapy Director and Staff therapists and discussed with nursing staff also.  Yevonne Pax, MD [facility] Pulmonary Critical Care Medicine Sleep Medicine

## 2020-06-08 ENCOUNTER — Other Ambulatory Visit (HOSPITAL_COMMUNITY): Payer: Self-pay

## 2020-06-08 LAB — URINALYSIS, ROUTINE W REFLEX MICROSCOPIC
Bilirubin Urine: NEGATIVE
Glucose, UA: NEGATIVE mg/dL
Hgb urine dipstick: NEGATIVE
Ketones, ur: NEGATIVE mg/dL
Leukocytes,Ua: NEGATIVE
Nitrite: NEGATIVE
Protein, ur: 100 mg/dL — AB
Specific Gravity, Urine: 1.015 (ref 1.005–1.030)
pH: 7 (ref 5.0–8.0)

## 2020-06-08 LAB — MAGNESIUM: Magnesium: 1.6 mg/dL — ABNORMAL LOW (ref 1.7–2.4)

## 2020-06-08 NOTE — Progress Notes (Addendum)
Pulmonary Critical Care Medicine [facility] [place]   PULMONARY CRITICAL CARE SERVICE  PROGRESS NOTE  Date of Service: 06/08/2020  LURENA NAEVE  ZOX:[id number]  DOB: [birth year]   DOA: 05/31/2020  Referring Physician: Carron Curie, MD  HPI: Donna Short is a 29 y.o. female seen for follow up of Acute on Chronic Respiratory Failure.  Patient remains on 21% aerosol trach collar using PMV as tolerated satting well no distress.  Medications: Reviewed on Rounds  Physical Exam:  Vitals: Pulse 94 respirations 18 BP 110/80 O2 sat 97% temp 98.9  Ventilator Settings not currently on ventilator  . General: Comfortable at this time . Eyes: Grossly normal lids, irises & conjunctiva . ENT: grossly tongue is normal . Neck: no obvious mass . Cardiovascular: S1 S2 normal no gallop . Respiratory: No rales or rhonchi noted . Abdomen: soft . Skin: no rash seen on limited exam . Musculoskeletal: not rigid . Psychiatric:unable to assess . Neurologic: no seizure no involuntary movements         Lab Data:   Basic Metabolic Panel: Recent Labs  Lab 06/02/20 0455 06/03/20 0331 06/04/20 0802 06/05/20 8119 06/06/20 0426 06/07/20 0433 06/08/20 0419  NA 133* 133* 136  --  134* 135  --   K 4.2 4.7 4.2  --  4.2 4.3  --   CL 92* 95* 98  --  101 101  --   CO2 26 25 25   --  23 23  --   GLUCOSE 99 96 105*  --  106* 102*  --   BUN 16 15 13   --  13 15  --   CREATININE 0.71 0.65 0.61  --  0.56 0.55  --   CALCIUM 9.9 9.2 9.6  --  9.3 9.5  --   MG  --   --  1.5* 1.3* 2.8* 1.6* 1.6*  PHOS 4.7*  --   --   --  4.5  --   --     ABG: No results for input(s): PHART, PCO2ART, PO2ART, HCO3, O2SAT in the last 168 hours.  Liver Function Tests: Recent Labs  Lab 06/02/20 0455  ALBUMIN 3.0*   Recent Labs  Lab 06/03/20 0331  LIPASE 24   No results for input(s): AMMONIA in the last 168 hours.  CBC: Recent Labs  Lab 06/03/20 0331 06/04/20 0802 06/06/20 0426  WBC 3.5* 3.6*  4.0  HGB 9.8* 10.1* 9.9*  HCT 28.7* 30.2* 30.2*  MCV 98.0 98.4 99.3  PLT 275 302 360    Cardiac Enzymes: No results for input(s): CKTOTAL, CKMB, CKMBINDEX, TROPONINI in the last 168 hours.  BNP (last 3 results) No results for input(s): BNP in the last 8760 hours.  ProBNP (last 3 results) No results for input(s): PROBNP in the last 8760 hours.  Radiological Exams: No results found.  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 Acute on chronic respiratory failure with hypoxiapatient will continue to wean on T collar currently on room air at 21%.  Patient is using PMV will continue to encourage PMV use.  Continue aggressive pulmonary toilet supportive measures. 2. Metabolic encephalopathy supportive care clearing pattern although very very slow. 3. Seizure disorder patient has been on Keppra which will be continued 4. Alcohol withdrawal has undergone withdrawal we will continue to monitor. She does have  as needed agitation and requires Haldol 5. Chronic obstructive asthma medical management nebulizers as necessary we will continue with supportive care   I have personally seen and evaluated the patient, evaluated laboratory and imaging results, formulated the assessment and plan and placed orders. The Patient requires high complexity decision making with multiple systems involvement.  Rounds were done with the Respiratory Therapy Director and Staff therapists and discussed with nursing staff also.  Yevonne Pax, MD [facility] Pulmonary Critical Care Medicine Sleep Medicine

## 2020-06-09 LAB — BASIC METABOLIC PANEL
Anion gap: 10 (ref 5–15)
BUN: 12 mg/dL (ref 6–20)
CO2: 24 mmol/L (ref 22–32)
Calcium: 9.3 mg/dL (ref 8.9–10.3)
Chloride: 101 mmol/L (ref 98–111)
Creatinine, Ser: 0.57 mg/dL (ref 0.44–1.00)
GFR, Estimated: >60 mL/min (ref >60)
Glucose, Bld: 103 mg/dL — ABNORMAL HIGH (ref 70–99)
Potassium: 4.4 mmol/L (ref 3.5–5.1)
Sodium: 135 mmol/L (ref 135–145)

## 2020-06-09 LAB — URINE CULTURE: Culture: NO GROWTH

## 2020-06-09 LAB — VANCOMYCIN, TROUGH: Vancomycin Tr: 20 ug/mL (ref 15–20)

## 2020-06-09 LAB — CBC
HCT: 29.6 % — ABNORMAL LOW (ref 36.0–46.0)
Hemoglobin: 10 g/dL — ABNORMAL LOW (ref 12.0–15.0)
MCH: 33.6 pg (ref 26.0–34.0)
MCHC: 33.8 g/dL (ref 30.0–36.0)
MCV: 99.3 fL (ref 80.0–100.0)
Platelets: 378 10*3/uL (ref 150–400)
RBC: 2.98 MIL/uL — ABNORMAL LOW (ref 3.87–5.11)
RDW: 11.9 % (ref 11.5–15.5)
WBC: 3.8 10*3/uL — ABNORMAL LOW (ref 4.0–10.5)
nRBC: 0 % (ref 0.0–0.2)

## 2020-06-09 LAB — PHOSPHORUS: Phosphorus: 4.5 mg/dL (ref 2.5–4.6)

## 2020-06-09 LAB — MAGNESIUM: Magnesium: 1.9 mg/dL (ref 1.7–2.4)

## 2020-06-09 NOTE — Progress Notes (Addendum)
Pulmonary Critical Care Medicine [facility] [place]   PULMONARY CRITICAL CARE SERVICE  PROGRESS NOTE  Date of Service: 06/09/2020  Donna Short  GGY:[id number]  DOB: 11-03-1990   DOA: 05/31/2020  Referring Physician: Carron Curie, MD  HPI: Donna Short is a 29 y.o. female seen for follow up of Acute on Chronic Respiratory Failure.  Patient is currently on T collar 21% FiO2 has been using the PMV should be able to proceed to capping  Medications: Reviewed on Rounds  Physical Exam:  Vitals: Temperature is 99.3 pulse 83 respiratory 24 blood pressure is 128/77 saturations 100%  Ventilator Settings on T collar with an FiO2 21% PMV in place  . General: Comfortable at this time . Eyes: Grossly normal lids, irises & conjunctiva . ENT: grossly tongue is normal . Neck: no obvious mass . Cardiovascular: S1 S2 normal no gallop . Respiratory: No rhonchi very coarse breath sounds . Abdomen: soft . Skin: no rash seen on limited exam . Musculoskeletal: not rigid . Psychiatric:unable to assess . Neurologic: no seizure no involuntary movements         Lab Data:   Basic Metabolic Panel: Recent Labs  Lab 06/03/20 0331 06/04/20 0802 06/04/20 0802 06/05/20 0350 06/06/20 0426 06/07/20 0433 06/08/20 0419 06/09/20 0454  NA 133* 136  --   --  134* 135  --  135  K 4.7 4.2  --   --  4.2 4.3  --  4.4  CL 95* 98  --   --  101 101  --  101  CO2 25 25  --   --  23 23  --  24  GLUCOSE 96 105*  --   --  106* 102*  --  103*  BUN 15 13  --   --  13 15  --  12  CREATININE 0.65 0.61  --   --  0.56 0.55  --  0.57  CALCIUM 9.2 9.6  --   --  9.3 9.5  --  9.3  MG  --  1.5*   < > 1.3* 2.8* 1.6* 1.6* 1.9  PHOS  --   --   --   --  4.5  --   --  4.5   < > = values in this interval not displayed.    ABG: No results for input(s): PHART, PCO2ART, PO2ART, HCO3, O2SAT in the last 168 hours.  Liver Function Tests: No results for input(s): AST, ALT, ALKPHOS, BILITOT, PROT, ALBUMIN  in the last 168 hours. Recent Labs  Lab 06/03/20 0331  LIPASE 24   No results for input(s): AMMONIA in the last 168 hours.  CBC: Recent Labs  Lab 06/03/20 0331 06/04/20 0802 06/06/20 0426 06/09/20 0454  WBC 3.5* 3.6* 4.0 3.8*  HGB 9.8* 10.1* 9.9* 10.0*  HCT 28.7* 30.2* 30.2* 29.6*  MCV 98.0 98.4 99.3 99.3  PLT 275 302 360 378    Cardiac Enzymes: No results for input(s): CKTOTAL, CKMB, CKMBINDEX, TROPONINI in the last 168 hours.  BNP (last 3 results) No results for input(s): BNP in the last 8760 hours.  ProBNP (last 3 results) No results for input(s): PROBNP in the last 8760 hours.  Radiological Exams: DG CHEST PORT 1 VIEW  Result Date: 06/08/2020 CLINICAL DATA:  Shortness of breath EXAM: PORTABLE CHEST 1 VIEW COMPARISON:  June 01, 2020 FINDINGS: Tracheostomy catheter tip is 4.8 cm above the carina. Enteric tube tip is in the distal stomach region. No pneumothorax. No edema or airspace  opacity. Heart size and pulmonary vascular normal. No adenopathy evident. Central catheter tip is in the superior vena cava. There is thoracic dextroscoliosis IMPRESSION: Tube and catheter positions as described without pneumothorax. No edema or airspace opacity. Stable cardiac silhouette. Electronically Signed   By: Bretta Bang III M.D.   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 Acute on chronic respiratory failure hypoxia we will continue with the plan to 2. Try capping trials. 3. Alcohol abuse no change supportive care 4. Nonconvulsive seizures appears to be at baseline no active seizures 5. Metabolic encephalopathy no change supportive care   I have personally seen and evaluated the patient, evaluated laboratory and imaging results, formulated the assessment and  plan and placed orders. The Patient requires high complexity decision making with multiple systems involvement.  Rounds were done with the Respiratory Therapy Director and Staff therapists and discussed with nursing staff also.  Yevonne Pax, MD [facility] Pulmonary Critical Care Medicine Sleep Medicine

## 2020-06-10 NOTE — Progress Notes (Addendum)
Pulmonary Critical Care Medicine [facility] [place]   PULMONARY CRITICAL CARE SERVICE  PROGRESS NOTE  Date of Service: 06/10/2020  Donna Short  [id number]  DOB: [birth year]   DOA: 05/31/2020  Referring Physician: Carron Curie, MD  HPI: Donna Short is a 29 y.o. female seen for follow up of Acute on Chronic Respiratory Failure.  Patient mains on 20% aerosol trach collar satting well no fever distress.  Medications: Reviewed on Rounds  Physical Exam:  Vitals: Pulse 90 respirations 24 BP 97/68 O2 sat 99% temp 98.8  Ventilator Settings: Not currently on ventilator  . General: Comfortable at this time . Eyes: Grossly normal lids, irises & conjunctiva . ENT: grossly tongue is normal . Neck: no obvious mass . Cardiovascular: S1 S2 normal no gallop . Respiratory: No rales or rhonchi noted . Abdomen: soft . Skin: no rash seen on limited exam . Musculoskeletal: not rigid . Psychiatric:unable to assess . Neurologic: no seizure no involuntary movements         Lab Data:   Basic Metabolic Panel: Recent Labs  Lab 06/04/20 0802 06/04/20 0802 06/05/20 7353 06/06/20 0426 06/07/20 0433 06/08/20 0419 06/09/20 0454  NA 136  --   --  134* 135  --  135  K 4.2  --   --  4.2 4.3  --  4.4  CL 98  --   --  101 101  --  101  CO2 25  --   --  23 23  --  24  GLUCOSE 105*  --   --  106* 102*  --  103*  BUN 13  --   --  13 15  --  12  CREATININE 0.61  --   --  0.56 0.55  --  0.57  CALCIUM 9.6  --   --  9.3 9.5  --  9.3  MG 1.5*   < > 1.3* 2.8* 1.6* 1.6* 1.9  PHOS  --   --   --  4.5  --   --  4.5   < > = values in this interval not displayed.    ABG: No results for input(s): PHART, PCO2ART, PO2ART, HCO3, O2SAT in the last 168 hours.  Liver Function Tests: No results for input(s): AST, ALT, ALKPHOS, BILITOT, PROT, ALBUMIN in the last 168 hours. No results for input(s): LIPASE, AMYLASE in the last 168 hours. No results for input(s): AMMONIA in the last 168  hours.  CBC: Recent Labs  Lab 06/04/20 0802 06/06/20 0426 06/09/20 0454  WBC 3.6* 4.0 3.8*  HGB 10.1* 9.9* 10.0*  HCT 30.2* 30.2* 29.6*  MCV 98.4 99.3 99.3  PLT 302 360 378    Cardiac Enzymes: No results for input(s): CKTOTAL, CKMB, CKMBINDEX, TROPONINI in the last 168 hours.  BNP (last 3 results) No results for input(s): BNP in the last 8760 hours.  ProBNP (last 3 results) No results for input(s): PROBNP in the last 8760 hours.  Radiological Exams: No results found.  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 Acute on chronic respiratory failure hypoxia patient continue to wean on 20% aerosol trach collar continue supportive measures and pulmonary toilet. 2. Try capping trials. 3. Alcohol abuse no change supportive care 4. Nonconvulsive seizures appears to be at baseline no active seizures 5. Metabolic encephalopathy no change  supportive care   I have personally seen and evaluated the patient, evaluated laboratory and imaging results, formulated the assessment and plan and placed orders. The Patient requires high complexity decision making with multiple systems involvement.  Rounds were done with the Respiratory Therapy Director and Staff therapists and discussed with nursing staff also.  Allyne Gee, MD [facility] Pulmonary Critical Care Medicine Sleep Medicine

## 2020-06-11 ENCOUNTER — Other Ambulatory Visit (HOSPITAL_COMMUNITY): Payer: Self-pay

## 2020-06-11 LAB — CULTURE, RESPIRATORY W GRAM STAIN
Culture: NO GROWTH
Gram Stain: NONE SEEN

## 2020-06-11 LAB — BASIC METABOLIC PANEL
Anion gap: 12 (ref 5–15)
BUN: 22 mg/dL — ABNORMAL HIGH (ref 6–20)
CO2: 22 mmol/L (ref 22–32)
Calcium: 9.7 mg/dL (ref 8.9–10.3)
Chloride: 98 mmol/L (ref 98–111)
Creatinine, Ser: 0.7 mg/dL (ref 0.44–1.00)
GFR, Estimated: >60 mL/min (ref >60)
Glucose, Bld: 95 mg/dL (ref 70–99)
Potassium: 4.5 mmol/L (ref 3.5–5.1)
Sodium: 132 mmol/L — ABNORMAL LOW (ref 135–145)

## 2020-06-11 LAB — CBC
HCT: 30.9 % — ABNORMAL LOW (ref 36.0–46.0)
Hemoglobin: 10.3 g/dL — ABNORMAL LOW (ref 12.0–15.0)
MCH: 32.7 pg (ref 26.0–34.0)
MCHC: 33.3 g/dL (ref 30.0–36.0)
MCV: 98.1 fL (ref 80.0–100.0)
Platelets: 373 10*3/uL (ref 150–400)
RBC: 3.15 MIL/uL — ABNORMAL LOW (ref 3.87–5.11)
RDW: 11.9 % (ref 11.5–15.5)
WBC: 3.9 10*3/uL — ABNORMAL LOW (ref 4.0–10.5)
nRBC: 0 % (ref 0.0–0.2)

## 2020-06-11 LAB — MAGNESIUM: Magnesium: 1.7 mg/dL (ref 1.7–2.4)

## 2020-06-11 LAB — PHOSPHORUS: Phosphorus: 5.4 mg/dL — ABNORMAL HIGH (ref 2.5–4.6)

## 2020-06-11 NOTE — Progress Notes (Signed)
Pulmonary Critical Care Medicine [facility] [place]   PULMONARY CRITICAL CARE SERVICE  PROGRESS NOTE  Date of Service: 06/11/2020  Donna Short  [id number]:[id number]  DOB: Jan 15, 1991   DOA: 05/31/2020  Referring Physician: Carron Curie, MD  HPI: Donna Short is a 29 y.o. female seen for follow up of Acute on Chronic Respiratory Failure.  Patient is currently on room air using PMV good saturations are noted.  Medications: Reviewed on Rounds  Physical Exam:  Vitals: Temperature is 97.0 pulse 85 respiratory 23 blood pressure is 127/83 saturations 100%  Ventilator Settings on T collar PMV  . General: Comfortable at this time . Eyes: Grossly normal lids, irises & conjunctiva . ENT: grossly tongue is normal . Neck: no obvious mass . Cardiovascular: S1 S2 normal no gallop . Respiratory: Scattered rhonchi coarse breath sounds . Abdomen: soft . Skin: no rash seen on limited exam . Musculoskeletal: not rigid . Psychiatric:unable to assess . Neurologic: no seizure no involuntary movements         Lab Data:   Basic Metabolic Panel: Recent Labs  Lab 06/06/20 0426 06/07/20 0433 06/08/20 0419 06/09/20 0454 06/11/20 0548  NA 134* 135  --  135 132*  K 4.2 4.3  --  4.4 4.5  CL 101 101  --  101 98  CO2 23 23  --  24 22  GLUCOSE 106* 102*  --  103* 95  BUN 13 15  --  12 22*  CREATININE 0.56 0.55  --  0.57 0.70  CALCIUM 9.3 9.5  --  9.3 9.7  MG 2.8* 1.6* 1.6* 1.9 1.7  PHOS 4.5  --   --  4.5 5.4*    ABG: No results for input(s): PHART, PCO2ART, PO2ART, HCO3, O2SAT in the last 168 hours.  Liver Function Tests: No results for input(s): AST, ALT, ALKPHOS, BILITOT, PROT, ALBUMIN in the last 168 hours. No results for input(s): LIPASE, AMYLASE in the last 168 hours. No results for input(s): AMMONIA in the last 168 hours.  CBC: Recent Labs  Lab 06/06/20 0426 06/09/20 0454 06/11/20 0548  WBC 4.0 3.8* 3.9*  HGB 9.9* 10.0* 10.3*  HCT 30.2* 29.6* 30.9*  MCV  99.3 99.3 98.1  PLT 360 378 373    Cardiac Enzymes: No results for input(s): CKTOTAL, CKMB, CKMBINDEX, TROPONINI in the last 168 hours.  BNP (last 3 results) No results for input(s): BNP in the last 8760 hours.  ProBNP (last 3 results) No results for input(s): PROBNP in the last 8760 hours.  Radiological Exams: No results found.  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 Acute on chronic respiratory failure hypoxia plan is to continue with T collar PMV as ordered. 2. Uncomplicated asthma supportive care we will continue to follow along. 3. Alcohol abuse in recovery no active withdrawal noted. 4. Acute metabolic encephalopathy supportive care we will continue to follow. 5. Seizure disorder no active seizures noted at this time   I have personally seen and evaluated the patient, evaluated laboratory and imaging results, formulated the assessment and plan and placed orders. The Patient requires high complexity decision making with multiple systems involvement.  Rounds were done with the Respiratory Therapy Director and Staff therapists and discussed with nursing staff also.  Yevonne Pax, MD [facility] Pulmonary Critical Care Medicine Sleep Medicine

## 2020-06-12 LAB — MAGNESIUM: Magnesium: 2.4 mg/dL (ref 1.7–2.4)

## 2020-06-12 NOTE — Progress Notes (Addendum)
Pulmonary Critical Care Medicine [facility] [place]   PULMONARY CRITICAL CARE SERVICE  PROGRESS NOTE  Date of Service: 06/12/2020  [name]  ZOX:[id number]  DOB: Aug 06, 1991   DOA: 05/31/2020  Referring Physician: Carron Curie, MD  HPI: Donna Short is a 29 y.o. female seen for follow up of Acute on Chronic Respiratory Failure.  Patient mains capped on room air at this time has completed 24 hours satting well.  Medications: Reviewed on Rounds  Physical Exam:  Vitals: Pulse 112 respirations 30 BP 102/70 O2 sat 99% temp 96.2  Ventilator Settings not currently on ventilator   . General: Comfortable at this time . Eyes: Grossly normal lids, irises & conjunctiva . ENT: grossly tongue is normal . Neck: no obvious mass . Cardiovascular: S1 S2 normal no gallop . Respiratory: No rales or rhonchi noted . Abdomen: soft . Skin: no rash seen on limited exam . Musculoskeletal: not rigid . Psychiatric:unable to assess . Neurologic: no seizure no involuntary movements         Lab Data:   Basic Metabolic Panel: Recent Labs  Lab 06/06/20 0426 06/06/20 0426 06/07/20 0433 06/08/20 0419 06/09/20 0454 06/11/20 0548 06/12/20 0414  NA 134*  --  135  --  135 132*  --   K 4.2  --  4.3  --  4.4 4.5  --   CL 101  --  101  --  101 98  --   CO2 23  --  23  --  24 22  --   GLUCOSE 106*  --  102*  --  103* 95  --   BUN 13  --  15  --  12 22*  --   CREATININE 0.56  --  0.55  --  0.57 0.70  --   CALCIUM 9.3  --  9.5  --  9.3 9.7  --   MG 2.8*   < > 1.6* 1.6* 1.9 1.7 2.4  PHOS 4.5  --   --   --  4.5 5.4*  --    < > = values in this interval not displayed.    ABG: No results for input(s): PHART, PCO2ART, PO2ART, HCO3, O2SAT in the last 168 hours.  Liver Function Tests: No results for input(s): AST, ALT, ALKPHOS, BILITOT, PROT, ALBUMIN in the last 168 hours. No results for input(s): LIPASE, AMYLASE in the last 168 hours. No results for input(s): AMMONIA in the  last 168 hours.  CBC: Recent Labs  Lab 06/06/20 0426 06/09/20 0454 06/11/20 0548  WBC 4.0 3.8* 3.9*  HGB 9.9* 10.0* 10.3*  HCT 30.2* 29.6* 30.9*  MCV 99.3 99.3 98.1  PLT 360 378 373    Cardiac Enzymes: No results for input(s): CKTOTAL, CKMB, CKMBINDEX, TROPONINI in the last 168 hours.  BNP (last 3 results) No results for input(s): BNP in the last 8760 hours.  ProBNP (last 3 results) No results for input(s): PROBNP in the last 8760 hours.  Radiological Exams: No results found.  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 Acute on chronic respiratory failure hypoxia patient will continue to be capped on room air at this time has gone 24 hours.  Current goals 48 hours.  Continue supportive measures pulmonary toilet. 2. Uncomplicated asthma supportive care we will continue to follow along. 3. Alcohol  abuse in recovery no active withdrawal noted. 4. Acute metabolic encephalopathy supportive care we will continue to follow. 5. Seizure disorder no active seizures noted at this time   I have personally seen and evaluated the patient, evaluated laboratory and imaging results, formulated the assessment and plan and placed orders. The Patient requires high complexity decision making with multiple systems involvement.  Rounds were done with the Respiratory Therapy Director and Staff therapists and discussed with nursing staff also.  Yevonne Pax, MD [facility] Pulmonary Critical Care Medicine Sleep Medicine

## 2020-06-13 LAB — CULTURE, BLOOD (ROUTINE X 2)
Culture: NO GROWTH
Culture: NO GROWTH
Special Requests: ADEQUATE

## 2020-06-13 LAB — BASIC METABOLIC PANEL
Anion gap: 11 (ref 5–15)
BUN: 32 mg/dL — ABNORMAL HIGH (ref 6–20)
CO2: 22 mmol/L (ref 22–32)
Calcium: 9.7 mg/dL (ref 8.9–10.3)
Chloride: 103 mmol/L (ref 98–111)
Creatinine, Ser: 0.79 mg/dL (ref 0.44–1.00)
GFR, Estimated: >60 mL/min (ref >60)
Glucose, Bld: 96 mg/dL (ref 70–99)
Potassium: 5.3 mmol/L — ABNORMAL HIGH (ref 3.5–5.1)
Sodium: 136 mmol/L (ref 135–145)

## 2020-06-13 NOTE — Progress Notes (Addendum)
Pulmonary Critical Care Medicine [facility] [place]   PULMONARY CRITICAL CARE SERVICE  PROGRESS NOTE  Date of Service: 06/13/2020  Donna Short  [id number]:[id number]  DOB: Jan 21, 1991   DOA: 05/31/2020  Referring Physician: Carron Curie, MD  HPI: Donna Short is a 29 y.o. female seen for follow up of Acute on Chronic Respiratory Failure.  Patient has been capped for 48 hours as of this morning currently satting well on room air.  Medications: Reviewed on Rounds  Physical Exam:  Vitals: Pulse 75 respirations 18 BP 99/67 O2 sat 100% temp 97.8  Ventilator Settings not currently on ventilator  . General: Comfortable at this time . Eyes: Grossly normal lids, irises & conjunctiva . ENT: grossly tongue is normal . Neck: no obvious mass . Cardiovascular: S1 S2 normal no gallop . Respiratory: No rales or rhonchi noted . Abdomen: soft . Skin: no rash seen on limited exam . Musculoskeletal: not rigid . Psychiatric:unable to assess . Neurologic: no seizure no involuntary movements         Lab Data:   Basic Metabolic Panel: Recent Labs  Lab 06/07/20 0433 06/08/20 0419 06/09/20 0454 06/11/20 0548 06/12/20 0414 06/13/20 0548  NA 135  --  135 132*  --  136  K 4.3  --  4.4 4.5  --  5.3*  CL 101  --  101 98  --  103  CO2 23  --  24 22  --  22  GLUCOSE 102*  --  103* 95  --  96  BUN 15  --  12 22*  --  32*  CREATININE 0.55  --  0.57 0.70  --  0.79  CALCIUM 9.5  --  9.3 9.7  --  9.7  MG 1.6* 1.6* 1.9 1.7 2.4  --   PHOS  --   --  4.5 5.4*  --   --     ABG: No results for input(s): PHART, PCO2ART, PO2ART, HCO3, O2SAT in the last 168 hours.  Liver Function Tests: No results for input(s): AST, ALT, ALKPHOS, BILITOT, PROT, ALBUMIN in the last 168 hours. No results for input(s): LIPASE, AMYLASE in the last 168 hours. No results for input(s): AMMONIA in the last 168 hours.  CBC: Recent Labs  Lab 06/09/20 0454 06/11/20 0548  WBC 3.8* 3.9*  HGB 10.0* 10.3*   HCT 29.6* 30.9*  MCV 99.3 98.1  PLT 378 373    Cardiac Enzymes: No results for input(s): CKTOTAL, CKMB, CKMBINDEX, TROPONINI in the last 168 hours.  BNP (last 3 results) No results for input(s): BNP in the last 8760 hours.  ProBNP (last 3 results) No results for input(s): PROBNP in the last 8760 hours.  Radiological Exams: No results found.  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 Acute on chronic respiratory failure hypoxia patient will continue to be capped on room air at this time has gone 48 hours capped satting well continue supportive measures and pulmonary toilet. 2. Uncomplicated asthma supportive care we will continue to follow along. 3. Alcohol abuse in recovery no active withdrawal noted. 4. Acute metabolic encephalopathy supportive care we will continue to follow. 5. Seizure disorder no active seizures noted at this time   I have personally seen and evaluated the patient, evaluated laboratory and imaging results, formulated the assessment and plan  and placed orders. The Patient requires high complexity decision making with multiple systems involvement.  Rounds were done with the Respiratory Therapy Director and Staff therapists and discussed with nursing staff also.  Allyne Gee, MD [facility] Pulmonary Critical Care Medicine Sleep Medicine

## 2020-06-14 LAB — POTASSIUM: Potassium: 5.3 mmol/L — ABNORMAL HIGH (ref 3.5–5.1)

## 2020-06-14 NOTE — Progress Notes (Signed)
Pulmonary Critical Care Medicine [facility] [place]   PULMONARY CRITICAL CARE SERVICE  PROGRESS NOTE  Date of Service: 06/14/2020  DARRIEL SINQUEFIELD  [id number]:[id number]  DOB: [birth year]   DOA: 05/31/2020  Referring Physician: Carron Curie, MD  HPI: MAZELL AYLESWORTH is a 29 y.o. female seen for follow up of Acute on Chronic Respiratory Failure.  Patient currently is doing well she was decannulated yesterday and no issues have come up overnight  Medications: Reviewed on Rounds  Physical Exam:  Vitals: Temperature is 97.9 pulse 84 respiratory 18 blood pressure is 97/65 saturations 100%  Ventilator Settings off the ventilator on nasal cannula decannulated  . General: Comfortable at this time . Eyes: Grossly normal lids, irises & conjunctiva . ENT: grossly tongue is normal . Neck: no obvious mass . Cardiovascular: S1 S2 normal no gallop . Respiratory: No rhonchi coarse breath sounds . Abdomen: soft . Skin: no rash seen on limited exam . Musculoskeletal: not rigid . Psychiatric:unable to assess . Neurologic: no seizure no involuntary movements         Lab Data:   Basic Metabolic Panel: Recent Labs  Lab 06/08/20 0419 06/09/20 0454 06/11/20 0548 06/12/20 0414 06/13/20 0548  NA  --  135 132*  --  136  K  --  4.4 4.5  --  5.3*  CL  --  101 98  --  103  CO2  --  24 22  --  22  GLUCOSE  --  103* 95  --  96  BUN  --  12 22*  --  32*  CREATININE  --  0.57 0.70  --  0.79  CALCIUM  --  9.3 9.7  --  9.7  MG 1.6* 1.9 1.7 2.4  --   PHOS  --  4.5 5.4*  --   --     ABG: No results for input(s): PHART, PCO2ART, PO2ART, HCO3, O2SAT in the last 168 hours.  Liver Function Tests: No results for input(s): AST, ALT, ALKPHOS, BILITOT, PROT, ALBUMIN in the last 168 hours. No results for input(s): LIPASE, AMYLASE in the last 168 hours. No results for input(s): AMMONIA in the last 168 hours.  CBC: Recent Labs  Lab 06/09/20 0454 06/11/20 0548  WBC 3.8* 3.9*  HGB 10.0*  10.3*  HCT 29.6* 30.9*  MCV 99.3 98.1  PLT 378 373    Cardiac Enzymes: No results for input(s): CKTOTAL, CKMB, CKMBINDEX, TROPONINI in the last 168 hours.  BNP (last 3 results) No results for input(s): BNP in the last 8760 hours.  ProBNP (last 3 results) No results for input(s): PROBNP in the last 8760 hours.  Radiological Exams: No results found.  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 Acute on chronic respiratory failure hypoxia patient has been successfully liberated and weaned and decannulated 2. Uncomplicated asthma supportive care 3. Alcohol abuse no active withdrawal 4. Nonconvulsive seizures no active seizures noted. 5. Metabolic encephalopathy supportive care no change   I have personally seen and evaluated the patient, evaluated laboratory and imaging results, formulated the assessment and plan and placed orders. The Patient requires high complexity decision making with multiple systems involvement.  Rounds were done with the Respiratory Therapy Director and Staff therapists and discussed with nursing staff also.  Yevonne Pax, MD [facility] Pulmonary Critical Care Medicine  Sleep Medicine

## 2020-06-15 LAB — PHENYTOIN LEVEL, TOTAL: Phenytoin Lvl: 9.1 ug/mL — ABNORMAL LOW (ref 10.0–20.0)

## 2020-06-15 LAB — PHENOBARBITAL LEVEL: Phenobarbital: 34.9 ug/mL — ABNORMAL HIGH (ref 15.0–30.0)

## 2020-06-15 LAB — POTASSIUM: Potassium: 5.3 mmol/L — ABNORMAL HIGH (ref 3.5–5.1)

## 2020-06-16 LAB — POTASSIUM: Potassium: 4.7 mmol/L (ref 3.5–5.1)

## 2020-06-18 LAB — LEVETIRACETAM LEVEL: Levetiracetam Lvl: 48.8 ug/mL — ABNORMAL HIGH (ref 10.0–40.0)

## 2020-06-27 ENCOUNTER — Other Ambulatory Visit: Payer: Self-pay

## 2020-06-27 ENCOUNTER — Ambulatory Visit (INDEPENDENT_AMBULATORY_CARE_PROVIDER_SITE_OTHER): Payer: Managed Care, Other (non HMO) | Admitting: Family Medicine

## 2020-06-27 ENCOUNTER — Encounter: Payer: Self-pay | Admitting: Family Medicine

## 2020-06-27 VITALS — BP 102/73 | HR 88 | Temp 97.1°F | Ht 62.0 in | Wt 119.2 lb

## 2020-06-27 DIAGNOSIS — N179 Acute kidney failure, unspecified: Secondary | ICD-10-CM

## 2020-06-27 DIAGNOSIS — R569 Unspecified convulsions: Secondary | ICD-10-CM | POA: Diagnosis not present

## 2020-06-27 DIAGNOSIS — E876 Hypokalemia: Secondary | ICD-10-CM | POA: Diagnosis not present

## 2020-06-27 DIAGNOSIS — G9341 Metabolic encephalopathy: Secondary | ICD-10-CM

## 2020-06-27 DIAGNOSIS — I10 Essential (primary) hypertension: Secondary | ICD-10-CM | POA: Diagnosis not present

## 2020-06-27 DIAGNOSIS — Z930 Tracheostomy status: Secondary | ICD-10-CM

## 2020-06-27 DIAGNOSIS — F339 Major depressive disorder, recurrent, unspecified: Secondary | ICD-10-CM

## 2020-06-27 MED ORDER — SERTRALINE HCL 50 MG PO TABS
50.0000 mg | ORAL_TABLET | Freq: Every day | ORAL | 1 refills | Status: DC
Start: 2020-06-27 — End: 2020-07-12

## 2020-06-27 MED ORDER — AMLODIPINE BESYLATE 5 MG PO TABS
5.0000 mg | ORAL_TABLET | Freq: Every day | ORAL | 0 refills | Status: DC
Start: 2020-06-27 — End: 2020-10-21

## 2020-06-27 MED ORDER — FOLIC ACID 1 MG PO TABS
1.0000 mg | ORAL_TABLET | Freq: Every day | ORAL | 1 refills | Status: DC
Start: 2020-06-27 — End: 2021-01-22

## 2020-06-27 MED ORDER — PHENOBARBITAL 100 MG PO TABS: 50.0000 mg | ORAL_TABLET | Freq: Two times a day (BID) | ORAL | 1 refills | Status: DC

## 2020-06-27 MED ORDER — METOPROLOL TARTRATE 50 MG PO TABS
50.0000 mg | ORAL_TABLET | Freq: Two times a day (BID) | ORAL | 1 refills | Status: DC
Start: 2020-06-27 — End: 2020-07-12

## 2020-06-27 MED ORDER — LEVETIRACETAM 1000 MG PO TABS: 1000.0000 mg | ORAL_TABLET | Freq: Two times a day (BID) | ORAL | 1 refills | Status: DC

## 2020-06-27 MED ORDER — PHENYTOIN SODIUM EXTENDED 100 MG PO CAPS
100.0000 mg | ORAL_CAPSULE | Freq: Three times a day (TID) | ORAL | 1 refills | Status: DC
Start: 2020-06-27 — End: 2020-08-13

## 2020-06-27 NOTE — Patient Instructions (Addendum)
-  finish famotidine then stop taking it.  -stop docusate sodium. Only take as needed for constipation -stop the fish oil tabs -melatonin as needed for sleep, don't have to take this nightly.   -weaning you off clonidine. You will take 1/2 pill twice a day x 7 days then 1/2 pill daily x 4 days then stop.   -see you back in 2 weeks to check blood pressure and adjust as needed.    CALL neurologist. I will also send email about med titration.   Will fill out paperwork and fax back in for you.

## 2020-06-27 NOTE — Progress Notes (Addendum)
Patient: Donna Short MRN: [id number] DOB: [birth year] PCP: Orland Mustard, MD     Subjective:  Chief Complaint  Patient presents with  . Hospitalization Follow-up  . Alcohol Induced Pancreatitis  . Seizures  . Hypertension  . acute respiratory failure secondary to PRES  . AKI    HPI: The patient is a 29 y.o. female who presents today for hospital follow up.   Admit date: 05/10/2020 Discharge date: 05/31/2020 to rehab. Then discharged from rehab on 06/16/2020.    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 Supportive care and TF per nutrition.   2) acute metabolic encephalopathy -recurrently with seizures 9/19. Confirmed MRI 9/20. Worse compared to previous episode. S/p burst suppression with continual EEG. Continued on keppra, dilantin, phenobarbital. Controlled BP.   3) PRES -9/20 seizure with MRI showing PRES. Moved ot ICU and started on cleviprex ggt.   4) HTN -put home dose of her lisinopril, metoprolol and amlodipine and clonidine added.   5) acute respiratory failure secondary to PRES. Required intubation and tracheostomy. Subsequent staph tracheal aspiration. Had swollen vocal cords on reintubation. Treated with dex. Extubate don 10/1 with similar issue/stridor from cords requiring reintubation and ultimately trach on 10/4. Progressed to ATC on 10/6. Received speech therapy.  Went to rehab and was decannulated on 06/13/20.   6) AKI IVF, resolved.   DIAGNOSTIC STUDIES 9/16 Abdominal CT >>findings consistent with acute on chronic pancreatitis predominantly involving the proximal pancreas. No peripancreatic fluid collections are identified. 9/20 EEG >> evidence of epileptogenicity arising fromleft posterior temporal region as well as cortical dysfunction in left hemisphere due to underlying structural abnormality, PRES 9/20 MRI >>patchy/extensive brain edema in a pattern consistent with PRES 9/24: MRI brain:  persistent findings consistent with PRES, overall mildly improved. Previously seen associated diffusion signal abnormality as largely resolved.  9/24: MRI cervical spine: normal MRI  9/22 ECHO and duplex >> normal 9/26 CXR stable with lower lung infiltrates L>R Swallow study on 10/18: can not see results, but on normal diet now.   Review of Systems  Constitutional: Positive for fatigue. Negative for diaphoresis and fever.  Respiratory: Negative for cough and shortness of breath.   Cardiovascular: Negative for chest pain, palpitations and leg swelling.  Gastrointestinal: Negative for abdominal pain, nausea and vomiting.  Neurological: Negative for headaches.    Allergies Patient is allergic to claritin [loratadine], peanut-containing drug products, benadryl [diphenhydramine hcl (sleep)], latex, and shrimp [shellfish allergy].  Past Medical History Patient  has a past medical history of Asthma, Depression, History of chicken pox, Hypertension, Migraines, Pancreatitis (01/2018), and Seizures (HCC).  Surgical History Patient  has a past surgical history that includes Wisdom tooth extraction.  Family History Pateint's family history includes Alcohol abuse in her father; Arthritis in her father; Asthma in her mother; Breast cancer in her mother; Diabetes in her father; Drug abuse in her mother; Hypertension in her mother.  Social History Patient  reports that she quit smoking about 2 years ago. Her smoking use included cigarettes. She has never used smokeless tobacco. She reports current alcohol use of about 9.0 standard drinks of alcohol per week. She reports that she does not use drugs.    Objective: Vitals:   06/27/20 1407  BP: 102/73  Pulse: 88  Temp: (!) 97.1 F (36.2 C)  TempSrc: Temporal  SpO2: 97%  Weight: 119 lb 3.2 oz (54.1 kg)  Height: 5\' 2"  (1.575 m)    Body mass index is  21.8 kg/m.  Physical Exam Vitals reviewed.  Constitutional:      Appearance: Normal  appearance. She is normal weight.  HENT:     Head: Normocephalic and atraumatic.  Cardiovascular:     Rate and Rhythm: Normal rate and regular rhythm.     Heart sounds: Normal heart sounds.  Pulmonary:     Effort: Pulmonary effort is normal.     Breath sounds: Normal breath sounds.  Abdominal:     General: Bowel sounds are normal.     Palpations: Abdomen is soft.  Skin:    Capillary Refill: Capillary refill takes less than 2 seconds.     Comments: Tracheostomy well healed  Neurological:     General: No focal deficit present.     Mental Status: She is alert and oriented to person, place, and time.       Assessment/plan: 1. Hypokalemia repleted in hospital, will recheck today. Was actually hyperkalemic on discharge from rehab on 10/21.  - COMPLETE METABOLIC PANEL WITH GFR; Future - COMPLETE METABOLIC PANEL WITH GFR  2. Essential hypertension She is low and is typical s/p hospitalization. I want her off clonidine so well titrate her off this over next 2 weeks. All of this written down for her. She will continue metoprolol for tachycardia and her norvasc. Close f/u in 2 weeks.  - Vitamin B12; Future - CBC with Differential/Platelet; Future - Lipid panel; Future - Lipid panel - CBC with Differential/Platelet - Vitamin B12  3. Seizures (HCC) Will have her finish out the 1500mg  of keppra BID and then put her at 1000mg  BID. Will need input of neurology for dilantin and phenobarbital titration. She was well controlled on keppra alone before. Asked that she call neurology and I will send chart as well. Appears she has been taking double the dilantin dose as well. She has taken the liquid form and pill form. Spent 45+ minutes going through medication today.  - Dilantin (Phenytoin) level, total; Future - Phenobarbital level; Future  4. Acute metabolic encephalopathy Resolved.   5. AKI (acute kidney injury) (HCC) Labs today. Resolved in hospital.   6. Depression, recurrent  (HCC) Started on zoloft and tolerating well. Will do phq9 at her f/u in 2 weeks.   7. Status post tracheostomy (HCC) Well healed. Completely closed.    -paper work for STD is also being asked to be filled out. Do not want her back at work until we have medication correct and seizure medication titrated appropriately.   - I spent the majority of the time going through her medicaiton as she can not seem to understand everything she should be on. Spent nearly 50 minutes doing this alone. Will have her come back in 2 weeks for f/u of HTN, blood pressure and will hopefully get in contact with neurologist as she has had very poor follow up with them. Will also do her STD paperwork and fax in for her. May need to get home health to help with medication.   Total time of encounter: 80 minutes total time of encounter, including 60 minutes spent in face-to-face patient care. This time includes coordination of care and counseling regarding medication management, hospital course and some of her STD paperwork. Remainder of non-face-to-face time involved reviewing chart documents/testing relevant to the patient encounter and documentation in the medical record.  This visit occurred during the SARS-CoV-2 public health emergency.  Safety protocols were in place, including screening questions prior to the visit, additional usage of staff PPE, and extensive  cleaning of exam room while observing appropriate contact time as indicated for disinfecting solutions.     Return in about 2 weeks (around 07/11/2020) for blood pressure/med check .     Orland Mustard, MD [facility]  06/27/2020

## 2020-06-28 ENCOUNTER — Telehealth: Payer: Self-pay

## 2020-06-28 LAB — CBC WITH DIFFERENTIAL/PLATELET
Absolute Monocytes: 391 cells/uL (ref 200–950)
Basophils Absolute: 18 cells/uL (ref 0–200)
Basophils Relative: 0.4 %
Eosinophils Absolute: 101 cells/uL (ref 15–500)
Eosinophils Relative: 2.2 %
HCT: 31.3 % — ABNORMAL LOW (ref 35.0–45.0)
Hemoglobin: 10.7 g/dL — ABNORMAL LOW (ref 11.7–15.5)
Lymphs Abs: 2024 cells/uL (ref 850–3900)
MCH: 33.5 pg — ABNORMAL HIGH (ref 27.0–33.0)
MCHC: 34.2 g/dL (ref 32.0–36.0)
MCV: 98.1 fL (ref 80.0–100.0)
MPV: 10.5 fL (ref 7.5–12.5)
Monocytes Relative: 8.5 %
Neutro Abs: 2065 cells/uL (ref 1500–7800)
Neutrophils Relative %: 44.9 %
Platelets: 259 10*3/uL (ref 140–400)
RBC: 3.19 10*6/uL — ABNORMAL LOW (ref 3.80–5.10)
RDW: 12.5 % (ref 11.0–15.0)
Total Lymphocyte: 44 %
WBC: 4.6 10*3/uL (ref 3.8–10.8)

## 2020-06-28 LAB — COMPLETE METABOLIC PANEL WITH GFR
AG Ratio: 1.2 (calc) (ref 1.0–2.5)
ALT: 20 U/L (ref 6–29)
AST: 21 U/L (ref 10–30)
Albumin: 4.3 g/dL (ref 3.6–5.1)
Alkaline phosphatase (APISO): 81 U/L (ref 31–125)
BUN: 17 mg/dL (ref 7–25)
CO2: 22 mmol/L (ref 20–32)
Calcium: 9.5 mg/dL (ref 8.6–10.2)
Chloride: 102 mmol/L (ref 98–110)
Creat: 0.79 mg/dL (ref 0.50–1.10)
GFR, Est African American: 116 mL/min/{1.73_m2} (ref >=60)
GFR, Est Non African American: 100 mL/min/{1.73_m2} (ref >=60)
Globulin: 3.7 g/dL (calc) (ref 1.9–3.7)
Glucose, Bld: 63 mg/dL — ABNORMAL LOW (ref 65–99)
Potassium: 4.6 mmol/L (ref 3.5–5.3)
Sodium: 137 mmol/L (ref 135–146)
Total Bilirubin: 0.2 mg/dL (ref 0.2–1.2)
Total Protein: 8 g/dL (ref 6.1–8.1)

## 2020-06-28 LAB — LIPID PANEL
Cholesterol: 239 mg/dL — ABNORMAL HIGH (ref <200)
HDL: 54 mg/dL (ref >=50)
LDL Cholesterol (Calc): 156 mg/dL (calc) — ABNORMAL HIGH
Non-HDL Cholesterol (Calc): 185 mg/dL (calc) — ABNORMAL HIGH (ref <130)
Total CHOL/HDL Ratio: 4.4 (calc) (ref <5.0)
Triglycerides: 156 mg/dL — ABNORMAL HIGH (ref <150)

## 2020-06-28 LAB — VITAMIN B12: Vitamin B-12: >2000 pg/mL — ABNORMAL HIGH (ref 200–1100)

## 2020-06-28 NOTE — Telephone Encounter (Signed)
Talked to pt and she is going to fax [place] paperwork

## 2020-06-28 NOTE — Telephone Encounter (Signed)
Pt states she needs a form filled out for work for a medical leave of absence request and acknowledgement form. Pt says she needs it by the 10th or she will be terminated. Do we have these forms or does pt need to bring one<

## 2020-06-28 NOTE — Telephone Encounter (Signed)
Please call pt and have her bring forms we do not have them.

## 2020-07-12 ENCOUNTER — Encounter: Payer: Self-pay | Admitting: Family Medicine

## 2020-07-12 ENCOUNTER — Ambulatory Visit (INDEPENDENT_AMBULATORY_CARE_PROVIDER_SITE_OTHER): Payer: Managed Care, Other (non HMO) | Admitting: Family Medicine

## 2020-07-12 ENCOUNTER — Other Ambulatory Visit: Payer: Self-pay

## 2020-07-12 ENCOUNTER — Telehealth: Payer: Self-pay | Admitting: Neurology

## 2020-07-12 VITALS — BP 116/83 | HR 98 | Temp 97.3°F | Ht 62.0 in | Wt 121.8 lb

## 2020-07-12 DIAGNOSIS — F339 Major depressive disorder, recurrent, unspecified: Secondary | ICD-10-CM

## 2020-07-12 DIAGNOSIS — G40901 Epilepsy, unspecified, not intractable, with status epilepticus: Secondary | ICD-10-CM | POA: Diagnosis not present

## 2020-07-12 DIAGNOSIS — I10 Essential (primary) hypertension: Secondary | ICD-10-CM

## 2020-07-12 MED ORDER — METOPROLOL TARTRATE 50 MG PO TABS
25.0000 mg | ORAL_TABLET | Freq: Two times a day (BID) | ORAL | 1 refills | Status: DC
Start: 2020-07-12 — End: 2020-11-22

## 2020-07-12 MED ORDER — SERTRALINE HCL 100 MG PO TABS: 100.0000 mg | ORAL_TABLET | Freq: Every day | ORAL | 1 refills | Status: DC

## 2020-07-12 NOTE — Progress Notes (Signed)
Patient: Donna Short MRN: [id number] DOB: Nov 07, 1990 PCP: Orland Mustard, MD     Subjective:  Chief Complaint  Patient presents with  . Hypertension  . Depression  . Seizures    HPI: The patient is a 29 y.o. female who presents today for HTN.  Hypertension: Here for follow up of hypertension.  Currently on norvasc 5mg  and metoprolol 50mg  bid. Takes medication as prescribed and denies any side effects. Exercise includes none. Weight has been stable. Denies any chest pain, headaches, shortness of breath, vision changes, swelling in lower extremities.   Seizures No seizures since in hospital. Secondary to PRES. I decreased her keppra down to 1000mg  BID. Continues on dilantin and phenobarbital. Levels were not drawn on last visit due to lab error. Will check again today. She seems to be taking as prescribed. Neurologist never got back in touch with me as far as weaning down off of her medication. I do not think she needs to be on all 3 anti-epileptics. She states she has extreme fatigue and light headedness after she takes one of her seizure medication, but she is unsure which one.   Depression She is currently on zoloft 50mg /day. She states she has periods of constant crying and her family is very negative and telling her she will die. She is also possibly getting evicted from her apartment due to failure to pay. She denies any SI/HI.   Review of Systems  Constitutional: Positive for fatigue. Negative for chills and fever.  Respiratory: Positive for shortness of breath. Negative for chest tightness and wheezing.   Cardiovascular: Negative for chest pain and palpitations.  Neurological: Positive for dizziness and light-headedness. Negative for headaches.    Allergies Patient is allergic to claritin [loratadine], peanut-containing drug products, benadryl [diphenhydramine hcl (sleep)], latex, and shrimp [shellfish allergy].  Past Medical History Patient  has a past medical history  of Asthma, Depression, History of chicken pox, Hypertension, Migraines, Pancreatitis (01/2018), and Seizures (HCC).  Surgical History Patient  has a past surgical history that includes Wisdom tooth extraction.  Family History Pateint's family history includes Alcohol abuse in her father; Arthritis in her father; Asthma in her mother; Breast cancer in her mother; Diabetes in her father; Drug abuse in her mother; Hypertension in her mother.  Social History Patient  reports that she quit smoking about 2 years ago. Her smoking use included cigarettes. She has never used smokeless tobacco. She reports current alcohol use of about 9.0 standard drinks of alcohol per week. She reports that she does not use drugs.    Objective: Vitals:   07/12/20 1404  BP: 116/83  Pulse: 98  Temp: (!) 97.3 F (36.3 C)  TempSrc: Temporal  SpO2: 100%  Weight: 121 lb 12.8 oz (55.2 kg)  Height: 5\' 2"  (1.575 m)    Body mass index is 22.28 kg/m.  Physical Exam Vitals reviewed.  Constitutional:      Appearance: Normal appearance. She is normal weight.  HENT:     Head: Normocephalic and atraumatic.  Cardiovascular:     Rate and Rhythm: Normal rate and regular rhythm.     Heart sounds: Normal heart sounds.  Pulmonary:     Effort: Pulmonary effort is normal.     Breath sounds: Normal breath sounds.  Abdominal:     General: Bowel sounds are normal.     Palpations: Abdomen is soft.  Neurological:     General: No focal deficit present.     Mental Status: She is alert and oriented  to person, place, and time.          Office Visit from 07/12/2020 in [facility]  PHQ-9 Total Score 7      Assessment/plan: 1. Benign essential HTN She weaned off clonidine completly yesterday. Doing well on norvasc and metoprolol, but her metoprolol still makes her fatigued and dizzy. I do wonder if all of her anti-epileptic drugs also contribute. Her heart rate is okay so we are going to decrease  her metorpolol down to 25mg  BID to hopefully help with fatigue/dizziness. Continue with log of heart rate and blood pressure. See me back in 2-4 weeks.   2. 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 Will check again. Also have reached out 2x to her neurologist. Called her office today as I do feel like her medications needs to be titrated. Left a message with staff. Will let Fallynn know the plan. No work until we have her adjusted and she is not as drowsy.  -Dr. returned my call. Plan will be to slowly titrate her off the dilantin first and then see how she does with plans to titrate off phenobarbital as well. Will stay on keppra long term. We know PRES caused seizures so do not feel like all 3 agents needed. Precautions given for any seizures. Also did another referral back to Dr. Terrace Arabia as she has been lost in follow up and would prefer Dr. Terrace Arabia to manage anti epileptics until stable on keppra only.  - Dilantin (Phenytoin) level, total; Future - Phenobarbital level; Future  3. Depression, recurrent (HCC) phq9 score is mild, but clinically she seems much worse. She is crying all of the time and had a very unhealthy relationship with her mother who almost sounds verbally abusive to her. She is also facing eviction and worrying about her job. She has a lot going on as well as recovering from her prolonged hospital stay. We are going to increase her zoloft to 100mg . See her back in one month for follow up. I think counseling would be very beneficial for her as well and will discuss with her next time as well.     This visit occurred during the SARS-CoV-2 public health emergency.  Safety protocols were in place, including screening questions prior to the visit, additional usage of staff PPE, and extensive cleaning of exam room while observing appropriate contact time as indicated for disinfecting solutions.     Return in  about 1 month (around 08/11/2020) for bp/depression/seizures. , MD [facility]   07/12/2020

## 2020-07-12 NOTE — Patient Instructions (Addendum)
-  increase your zoloft (sertraline) to 100mg /day. I sent in a new prescription for this so you can either throw away the 50mg  pills or just take 2 of these.   -bring your blood pressure readings and your heart rate in next time.   -decrease your metoprolol down to 25mg  TWICE a day. Can just cut pill in half.   -we are going to wean you off the dilantin. Start taking this only twice a day for 2 weeks then on 12/2 go down to once/day only. After one week at once/day you can stop completely on 08/02/20.   -new referral to Dr. .    -see you back in one month.

## 2020-07-12 NOTE — Telephone Encounter (Signed)
I was able to talk with Dr. Artis Flock, patient is on polypharmacy treatment now for her seizure, Keppra 1000 mg twice a day, phenobarbital 50 mg twice a day, and Dilantin ER 100 mg 3 times a day,  Last office visit with [place] was in June 2019, per record, she supposed to take Keppra 500 mg twice daily,  Reviewed most recent hospital discharge September 16 through May 31, 2020, acute on chronic pancreatitis, acute metabolic encephalopathy, PRES, acute respiratory failure, intubated for burst suppression on September 20, vocal cord swallowing, reintubation September 30, treated with dexamethasone, eventually require tracheostomy on October 4, staphylococcal tracheal aspiration, spiked fever of 105 Alcohol abuse  I have advised Dr. Artis Flock to gradually tapering off Dilantin, keep current dose of Keppra 1000 mg twice a day, phenobarbital 100 mg daily

## 2020-07-14 LAB — PHENYTOIN LEVEL, TOTAL: Phenytoin, Total: 20.9 mg/L — ABNORMAL HIGH (ref 10.0–20.0)

## 2020-07-14 LAB — PHENOBARBITAL LEVEL: Phenobarbital, Serum: 29.7 mg/L (ref 15.0–40.0)

## 2020-07-16 ENCOUNTER — Telehealth: Payer: Self-pay

## 2020-07-16 NOTE — Telephone Encounter (Signed)
Patient is calling regarding her records from her visit 11/18 she needs them to be faxed to her insurance company for her short term disability due by the 30th [phone]  Patient is requesting a call back before sending

## 2020-07-17 NOTE — Telephone Encounter (Signed)
Patient was informed of medical records number and stated she will call

## 2020-07-17 NOTE — Telephone Encounter (Signed)
Patient will have to contact medical records. Please call patient back and provide medical records number

## 2020-07-18 ENCOUNTER — Telehealth: Payer: Self-pay | Admitting: Neurology

## 2020-07-18 ENCOUNTER — Encounter: Payer: Self-pay | Admitting: Neurology

## 2020-07-18 ENCOUNTER — Ambulatory Visit: Payer: Managed Care, Other (non HMO) | Admitting: Neurology

## 2020-07-18 VITALS — BP 109/78 | HR 98 | Ht 62.0 in | Wt 120.0 lb

## 2020-07-18 DIAGNOSIS — I6783 Posterior reversible encephalopathy syndrome: Secondary | ICD-10-CM | POA: Diagnosis not present

## 2020-07-18 DIAGNOSIS — G40901 Epilepsy, unspecified, not intractable, with status epilepticus: Secondary | ICD-10-CM | POA: Diagnosis not present

## 2020-07-18 MED ORDER — LEVETIRACETAM 1000 MG PO TABS
1000.0000 mg | ORAL_TABLET | Freq: Two times a day (BID) | ORAL | 4 refills | Status: DC
Start: 2020-07-18 — End: 2020-10-27

## 2020-07-18 MED ORDER — PHENOBARBITAL 15 MG PO TABS
ORAL_TABLET | ORAL | 3 refills | Status: DC
Start: 2020-07-18 — End: 2020-10-27

## 2020-07-18 NOTE — Telephone Encounter (Signed)
[employer] order sent to GI. They will obtain the auth and reach out to the patient to schedule.  °

## 2020-07-18 NOTE — Progress Notes (Signed)
Chief Complaint  Patient presents with  . Seizures    She is here with her father, Donna Short. States her last seizure occurred during her recent hospitalization. She is currently taking phenobarbital 100mg , 0.5 tabs BID and phenytoin sodium 100mg , 1 cap BID.      HISTORICAL  Donna Short is here with her father, following for seizure, recent hospital discharge September 16 of May 31, 2020, she was in nonconvulsive status epilepticus, was put into burst suppression, following her acute pancreatitis   Donna Short a 29 year old right-handed female, accompanied by her godfather Donna Short, seen in refer by emergency room for evaluation of her only seizure in December 19 2015.  On April 14th 2017,She had acute onset generalized tonic-clonic seizure at work in 07-07-1980, she woke up in the ambulance, 3 hours later, while at emergency room, she had her second generalized tonic-clonic seizure, she has been treated with Keppra 500 mg twice a day, tolerating medication well.  CT scan of the brain in April 2017, no intra-cranial abnormality, right parietal skull abrasion,  Laboratory evaluation showed mild anemia, with hemoglobin 11.8, normal CMP, negative pregnancy test.  She denies focal symptoms, she was developmentally normal, denier history of febrile seizure, there was no family history of epilepsy.  She works at [employer] for 09-01-1987  EEG was normal in May 27th 2017. She could not afford MRI brain.  She is now taking Keppra 500mg  bid, Noticed irritability, anxiety, also complains work-related anxiety, we switched her to lamotrigine from Keppra  She has lost follow-up since last visit in May 2017, return to clinic again on February 12, 2018 she was supposed to take lamotrigine 100 mg twice a day, but not compliant with her medications, she was admitted to the hospital in July 2019 for acute pancreatitis, related to alcohol use, hospital course was also  complicated by acute encephalopathy, possible alcohol withdrawal seizure, possible aspiration pneumonia, requiring intubation, during her hospital stay, there was reported seizure activity,  CT head without contrast on Jan 18, 2018 was normal.  Discharge laboratory on January 28, 2018 showed low potassium 3.3, normal creatinine 0.91,WBC remains elevated 12.9, anemia hemoglobin of 7.7, normal TSH, folic acid, B12 396,  CT abdomen on Jan 20, 2018 showed changes of acute pancreatitis, considerable peripancreatic fluid, and the free fluid in the abdomen, wall thickening within the colon, bilateral pleural effusion, lower lobe consolidation,  She n was complains of increased fatigue, with documented blood pressure 94/68, heart rate of 97  Blood pressure is 94/68, heart rate of 97, she was discharged with prescription of labetalol 200 mg twice a day, hemoglobin of only 9.9, she was switched back to Keppra 500 mg twice daily during her hospital stay  She again lost to follow-up since June 2019,  I reviewed her most recent hospital discharge September 16 through May 31, 2020, acute on chronic pancreatitis, she presented with intractable nausea vomiting after binge drinking, during her hospital stay, had seizure on May 13, 2020, despite higher dose of Keppra, she becomes confused, acute metabolic encephalopathy, alcohol abuse PRES, acute respiratory failure, intubated for burst suppression on September 20, vocal cord swallowing, reintubation September 30, treated with dexamethasone, eventually require tracheostomy on October 4, staphylococcal tracheal aspiration, spiked fever of 105, patient had no recollection of the event, woke up 3 weeks later at the hospital,  I personally reviewed MRI of the brain September 20 and 24, 2021, patchy cortical/subcortical T2/flair hyperintensity involving bilateral frontal, parietal, temporal, occipital  region, right worse than left frontal region, and the left  temporal occipital region, mild patchy involvement of cerebellum  She was seen by epileptologist Dr. 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 Artis FlockWolfe to gradually tapering off Dilantin, keep current dose of Keppra 1000 mg twice a day, phenobarbital 100 mg daily  She was discharged with Keppra 1500 mg 12 hours, phenobarbital 64.8 mg twice a day, Dilantin 75 mg every 8 hours  I discussed with her primary care physician Dr. Artis FlockWolfe, on July 12 2020, decided to gradually taper off Dilantin, keep current dose of Keppra 1000 mg twice a day, phenobarbital 100 mg daily  Laboratory evaluation July 12, 2020: Dilantin level was 20.9, phenobarbital 29.7, B12 more than 2000, LDL 156, CMP was normal, glucose was 63, hemoglobin 10.7,  Lipase was normal 24 in October 2021  She is now 80% back to her baseline, lives alone, was brought in by her godfather today, she complains of side effect from polypharmacy treatment, dizziness, sleepiness, is in the process of weaning off Dilantin,  REVIEW OF SYSTEMS: Full 14 system review of systems performed and notable only for as above All other review of systems were negative.  ALLERGIES: Allergies  Allergen Reactions  . Claritin [Loratadine] Itching  . Peanut-Containing Drug Products Other (See Comments)  . Benadryl [Diphenhydramine Hcl (Sleep)] Rash  . Latex Rash  . Shrimp [Shellfish Allergy] Hives and Rash    HOME MEDICATIONS: Current Outpatient Medications  Medication Sig Dispense Refill  . albuterol (PROVENTIL) (2.5 MG/3ML)  0.083% nebulizer solution Take 3 mLs (2.5 mg total) by nebulization every 6 (six) hours as needed for wheezing or shortness of breath. 75 mL 12  . amLODipine (NORVASC) 5 MG tablet Take 1 tablet (5 mg total) by mouth daily. 90 tablet 0  . folic acid (FOLVITE) 1 MG tablet Take 1 tablet (1 mg total) by mouth daily. 90 tablet 1  . levETIRAcetam (KEPPRA) 1000 MG tablet Take 1 tablet (1,000 mg total) by mouth 2 (two) times daily. 180 tablet 1  . metoprolol tartrate (LOPRESSOR) 50 MG tablet Take 0.5 tablets (25 mg total) by mouth 2 (two) times daily. 180 tablet 1  . Mouthwashes (MOUTH RINSE) LIQD solution 15 mLs by Mouth Rinse route 2 (two) times daily.  0  . Multiple Vitamin (MULTIVITAMIN WITH MINERALS) TABS tablet Place 1 tablet into feeding tube daily.    Marland Kitchen. PHENObarbital (LUMINAL) 100 MG tablet Take 0.5 tablets (50 mg total) by mouth 2 (two) times daily. 42 tablet 1  . phenytoin (DILANTIN) 100 MG ER capsule Take 1 capsule (100 mg total) by mouth 3 (three) times daily. 90 capsule 1  . sertraline (ZOLOFT) 100 MG tablet Take 1 tablet (100 mg total) by mouth daily. 90 tablet 1   No current facility-administered medications for this visit.    PAST MEDICAL HISTORY: Past Medical History:  Diagnosis Date  . Asthma   . Depression   . History of chicken pox   . Hypertension   . Migraines   . Pancreatitis 01/2018  . Seizures (HCC)     PAST SURGICAL HISTORY: Past Surgical History:  Procedure Laterality Date  . WISDOM TOOTH EXTRACTION  FAMILY HISTORY: Family History  Problem Relation Age of Onset  . Diabetes Father   . Arthritis Father   . Alcohol abuse Father   . Breast cancer Mother   . Asthma Mother   . Drug abuse Mother   . Hypertension Mother     SOCIAL HISTORY: Social History   Socioeconomic History  . Marital status: Single    Spouse name: Not on file  . Number of children: 0  . Years of education: 2 years college  . Highest education level: Not on file  Occupational  History  . Occupation: Deli at [employer]  . Smoking status: Former Smoker    Types: Cigarettes    Quit date: 01/21/2018    Years since quitting: 2.4  . Smokeless tobacco: Never Used  Vaping Use  . Vaping Use: Never used  Substance and Sexual Activity  . Alcohol use: Yes    Alcohol/week: 9.0 standard drinks    Types: 9 Shots of liquor per week    Comment: 02/2019 "  2 shots every 3 or 4 days "  . Drug use: No  . Sexual activity: Not Currently  Other Topics Concern  . Not on file  Social History Narrative   Lives at home alone.   Right-handed.   No caffeine use.   Social Determinants of Health   Financial Resource Strain:   . Difficulty of Paying Living Expenses: Not on file  Food Insecurity:   . Worried About Programme researcher, broadcasting/film/video in the Last Year: Not on file  . Ran Out of Food in the Last Year: Not on file  Transportation Needs:   . Lack of Transportation (Medical): Not on file  . Lack of Transportation (Non-Medical): Not on file  Physical Activity:   . Days of Exercise per Week: Not on file  . Minutes of Exercise per Session: Not on file  Stress:   . Feeling of Stress : Not on file  Social Connections:   . Frequency of Communication with Friends and Family: Not on file  . Frequency of Social Gatherings with Friends and Family: Not on file  . Attends Religious Services: Not on file  . Active Member of Clubs or Organizations: Not on file  . Attends Banker Meetings: Not on file  . Marital Status: Not on file  Intimate Partner Violence:   . Fear of Current or Ex-Partner: Not on file  . Emotionally Abused: Not on file  . Physically Abused: Not on file  . Sexually Abused: Not on file     PHYSICAL EXAM   Vitals:   07/18/20 1147  BP: 109/78  Pulse: 98  Weight: 120 lb (54.4 kg)  Height: 5\' 2"  (1.575 m)   Not recorded     Body mass index is 21.95 kg/m.  PHYSICAL EXAMNIATION:  Gen: NAD, conversant, well nourised, well groomed                      Cardiovascular: Regular rate rhythm, no peripheral edema, warm, nontender. Eyes: Conjunctivae clear without exudates or hemorrhage Neck: Supple, no carotid bruits. Pulmonary: Clear to auscultation bilaterally   NEUROLOGICAL EXAM:  MENTAL STATUS: Speech:    Speech is normal; fluent and spontaneous with normal comprehension.  Cognition:     Orientation to time, place and person     Normal recent and remote memory     Normal Attention span and concentration     Normal Language, naming,  repeating,spontaneous speech     Fund of knowledge   CRANIAL NERVES: CN II: Visual fields are full to confrontation. Pupils are round equal and briskly reactive to light. CN III, IV, VI: extraocular movement are normal. No ptosis. CN V: Facial sensation is intact to light touch CN VII: Face is symmetric with normal eye closure  CN VIII: Hearing is normal to causal conversation. CN IX, X: Phonation is normal. CN XI: Head turning and shoulder shrug are intact  MOTOR: There is no pronator drift of out-stretched arms. Muscle bulk and tone are normal. Muscle strength is normal.  REFLEXES: Reflexes are 2+ and symmetric at the biceps, triceps, knees, and ankles. Plantar responses are flexor.  SENSORY: Intact to light touch, pinprick and vibratory sensation are intact in fingers and toes.  COORDINATION: There is no trunk or limb dysmetria noted.  GAIT/STANCE: Posture is normal. Gait is steady with normal steps, base, arm swing, and turning. Heel and toe walking are normal.  Mild difficulty with tandem walking   DIAGNOSTIC DATA (LABS, IMAGING, TESTING) - I reviewed patient records, labs, notes, testing and imaging myself where available.   ASSESSMENT AND PLAN  Donna Short is a 29 y.o. 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)  She was put on burst suppression during her nonconvulsive status epilepticus in September 2021, required  polypharmacy treatment,  Now has much improved,  Repeat MRI of brain, EEG,  Continue taper off Dilantin (will finish on July 26, 2020)  Then began to taper off phenobarbital, new prescription 15 mg tablets was offered,   Take 6 tablets every night for 2 weeks, 5 tablets every night for 2 weeks 4 tablets every night for 2 weeks 3 tablets every Night for 2 weeks 2 tablets every night for 2 weeks 1 tablet every night (keep taking one tablet of phenobarbital 15mg  every night until next follow-up visit )  Keep Keppra 1000 mg twice a day  Return to clinic in 2 months  Total time spent reviewing the chart, obtaining history, examined patient, ordering tests, documentation, consultations and family, care coordination was 70 minutes   .   , M.D. Ph.D.  [facility] [address], [address] [place], [place] Ph: [phone] Fax: [phone]  CC:  [phone], MD [address] [place],  [place]  [address], MD

## 2020-07-18 NOTE — Patient Instructions (Signed)
Continue to taper off Dilantin , will stop by Jul 26 2020  Then start tapering off phenobarbital July 29, 2020, please use new prescription of phenobarbital 15 mg tablets, Take 6 tablets every night for 2 weeks, 5 tablets every night for 2 weeks 4 tablets every night for 2 weeks 3 tablets every Night for 2 weeks 2 tablets every night for 2 weeks 1 tablet every night (keep taking one tablet of phenobarbital 15mg  every night until next follow-up visit )  Keep Keppra 1000mg  twice a day

## 2020-07-23 ENCOUNTER — Telehealth: Payer: Self-pay | Admitting: Neurology

## 2020-07-23 NOTE — Telephone Encounter (Signed)
Pt is asking for a call from RN to have an understanding as to what Dr Terrace Arabia is looking for from the EEG & MRI and what the differences are between the 2, please call.

## 2020-07-23 NOTE — Telephone Encounter (Signed)
I spoke to the patient and answered her questions concerning her tests.

## 2020-07-26 ENCOUNTER — Other Ambulatory Visit: Payer: Managed Care, Other (non HMO)

## 2020-07-26 ENCOUNTER — Other Ambulatory Visit: Payer: Self-pay

## 2020-07-26 ENCOUNTER — Ambulatory Visit
Admission: RE | Admit: 2020-07-26 | Discharge: 2020-07-26 | Disposition: A | Discharge status: Home / Self Care | Payer: Managed Care, Other (non HMO) | Source: Ambulatory Visit | Attending: Neurology | Admitting: Neurology

## 2020-07-26 DIAGNOSIS — I6783 Posterior reversible encephalopathy syndrome: Secondary | ICD-10-CM

## 2020-07-26 DIAGNOSIS — G40901 Epilepsy, unspecified, not intractable, with status epilepticus: Secondary | ICD-10-CM

## 2020-07-26 NOTE — Telephone Encounter (Signed)
Pt called, need clarification on how to take PHENObarbital (LUMINAL) 15 MG tablet. PCP told me to take 1/2 of 100 mg. Would like a call from the nurse.

## 2020-07-26 NOTE — Telephone Encounter (Addendum)
She was taking phenobarbital 50mg , one tablet BID. Per last office note, this medication is being tapered down with the following instructions:  Phenobarbital 15mg  tablets -   Take 6 tablets every night for 2 weeks, 5 tablets every night for 2 weeks, 4 tablets every night for 2 weeks, 3 tablets every Night for 2 weeks, 2 tablets every night for 2 weeks, Then1  tablet every night (keep taking one tablet of phenobarbital 15mg  every night until next follow-up visit.  The patient repeated these instructions back to me correctly. She will call me back with any further questions.

## 2020-07-30 ENCOUNTER — Telehealth: Payer: Self-pay | Admitting: Neurology

## 2020-07-30 NOTE — Telephone Encounter (Signed)
The patient called back and she verbalized understanding of the findings.

## 2020-07-30 NOTE — Telephone Encounter (Signed)
Left message requesting a call back.

## 2020-07-30 NOTE — Telephone Encounter (Signed)
IMPRESSION: Unremarkable MRI scan of the brain without contrast.  Compared to previous MRI from 05/18/2020 the T2/flair abnormal signal intensities have completely resolved.  Please call patient, repeat MRI of the brain showed significant improvement, complete resolution of T2/FLAIR abnormal signal intensity,

## 2020-08-01 ENCOUNTER — Ambulatory Visit (INDEPENDENT_AMBULATORY_CARE_PROVIDER_SITE_OTHER): Payer: Managed Care, Other (non HMO) | Admitting: Neurology

## 2020-08-01 DIAGNOSIS — G40901 Epilepsy, unspecified, not intractable, with status epilepticus: Secondary | ICD-10-CM

## 2020-08-01 DIAGNOSIS — I6783 Posterior reversible encephalopathy syndrome: Secondary | ICD-10-CM

## 2020-08-02 ENCOUNTER — Telehealth: Payer: Self-pay

## 2020-08-02 NOTE — Procedures (Signed)
   HISTORY: 29 year old female presented with seizure  TECHNIQUE:  This is a routine 16 channel EEG recording with one channel devoted to a limited EKG recording.  It was performed during wakefulness, drowsiness and asleep.  Hyperventilation and photic stimulation were performed as activating procedures.  There are minimum muscle and movement artifact noted.  Upon maximum arousal, posterior dominant waking rhythm consistent of moderate with rhythmic slowing, in the theta range, with frequency of 5 hz. Activities are symmetric over the bilateral posterior derivations and attenuated with eye opening.  Hyperventilation produced mild/moderate buildup with higher amplitude and the slower activities noted.  Photic stimulation did not alter the tracing.  During EEG recording, patient developed drowsiness and no deeper stage of sleep was achieved During EEG recording, there was no epileptiform discharge noted.  EKG demonstrate sinus rhythm, with heart rate of 80 bpm  CONCLUSION: This is a  normal awake EEG.  There is evidence of moderate background slowing, indicating moderate bihemispheric malfunction.  There was no evidence of epileptiform discharge.  Levert Feinstein, M.D. Ph.D.  [facility] [address] [place], [place] [address] Phone: [phone] Fax:      [phone]

## 2020-08-02 NOTE — Telephone Encounter (Signed)
Pt is wanting a call about labs

## 2020-08-06 NOTE — Telephone Encounter (Signed)
Lvm for pt to call the office back. 

## 2020-08-06 NOTE — Telephone Encounter (Signed)
Gave lab results from 07/14/2020. Dr Terrace Arabia has also weaned down Phenobarbital. Pt expressed understanding.

## 2020-08-13 ENCOUNTER — Telehealth: Payer: Self-pay | Admitting: Neurology

## 2020-08-13 ENCOUNTER — Encounter: Payer: Self-pay | Admitting: Family Medicine

## 2020-08-13 ENCOUNTER — Ambulatory Visit (INDEPENDENT_AMBULATORY_CARE_PROVIDER_SITE_OTHER): Payer: Managed Care, Other (non HMO) | Admitting: Family Medicine

## 2020-08-13 ENCOUNTER — Other Ambulatory Visit: Payer: Self-pay

## 2020-08-13 VITALS — BP 112/80 | HR 84 | Temp 97.6°F | Ht 62.0 in | Wt 121.4 lb

## 2020-08-13 DIAGNOSIS — I10 Essential (primary) hypertension: Secondary | ICD-10-CM | POA: Diagnosis not present

## 2020-08-13 DIAGNOSIS — F339 Major depressive disorder, recurrent, unspecified: Secondary | ICD-10-CM | POA: Diagnosis not present

## 2020-08-13 DIAGNOSIS — G40901 Epilepsy, unspecified, not intractable, with status epilepticus: Secondary | ICD-10-CM | POA: Diagnosis not present

## 2020-08-13 NOTE — Telephone Encounter (Signed)
I spoke to the patient and provided her with the EEG results. She verbalized understanding of the plan.

## 2020-08-13 NOTE — Telephone Encounter (Signed)
Pt called have not heard from anyone about my EEG results. Would like a call from the nurse.

## 2020-08-13 NOTE — Telephone Encounter (Signed)
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

## 2020-08-13 NOTE — Progress Notes (Signed)
Patient: Donna Short MRN: [id number] DOB: 1990-08-31 PCP: Orland Mustard, MD     Subjective:  Chief Complaint  Patient presents with  . Hypertension  . Depression  . Seizures    HPI: The patient is a 29 y.o. female who presents today for HTN/Depression/Seizures. She would like to discuss medication changes and lab results. She also has concerns about throat irritation due to Trach.  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 She also followed up with her neurologist and had a normal EEG and MRI. Dr. Terrace Arabia also started her weaning off of her phenobarbital. She started the wean on 07/29/20. She will continue with the keppra 1000mg  BID. She has had no seizure activity. She is feeling much better off of the dilantin. Less tired and less sleeping.   2) HTN She is currently well controlled. I weaned her off clonidine and she is currently on norvasc 5mg /day, lopressor 25mg  twice a day. She is feeling better. She is not sleeping as much as she was with the dilantin. She has a detailed log with her heart rate and her blood pressures. Average are to goal. A few outliers when she was upset, but overall very well controlled.   3) depression I increased her zoloft to 100mg  at her last visit. She is crying less, but she still gets upset/mad. She gets upset about her family and they seem to be a big source of her anger. She is still not sure if she will get evicted.   4) sore area on skin where she had trach  She feels like it is irritated from the trach. It's on the skin and she would like to know what to do with this.    Review of Systems  Constitutional: Negative for chills, fatigue and fever.  HENT: Negative for dental problem, ear pain, hearing loss, sore throat and trouble swallowing.   Eyes: Negative for visual disturbance.  Respiratory: Negative for cough, chest tightness and shortness of breath.   Cardiovascular: Negative for chest pain,  palpitations and leg swelling.  Gastrointestinal: Negative for abdominal pain, blood in stool, diarrhea and nausea.  Endocrine: Negative for cold intolerance, polydipsia, polyphagia and polyuria.  Genitourinary: Negative for dysuria and hematuria.  Musculoskeletal: Negative for arthralgias.  Skin: Negative for rash.       Skin irritation where trach was   Neurological: Negative for dizziness and headaches.  Psychiatric/Behavioral: Negative for dysphoric mood and sleep disturbance. The patient is not nervous/anxious.     Allergies Patient is allergic to claritin [loratadine], peanut-containing drug products, benadryl [diphenhydramine hcl (sleep)], latex, and shrimp [shellfish allergy].  Past Medical History Patient  has a past medical history of Asthma, Depression, History of chicken pox, Hypertension, Migraines, Pancreatitis (01/2018), and Seizures (HCC).  Surgical History Patient  has a past surgical history that includes Wisdom tooth extraction.  Family History Pateint's family history includes Alcohol abuse in her father; Arthritis in her father; Asthma in her mother; Breast cancer in her mother; Diabetes in her father; Drug abuse in her mother; Hypertension in her mother.  Social History Patient  reports that she quit smoking about 2 years ago. Her smoking use included cigarettes. She has never used smokeless tobacco. She reports current alcohol use of about 9.0 standard drinks of alcohol per week. She reports that she does not use drugs.    Objective: Vitals:   08/13/20 1348  BP: 112/80  Pulse: 84  Temp: 97.6 F (36.4  C)  TempSrc: Temporal  SpO2: 100%  Weight: 121 lb 6.4 oz (55.1 kg)  Height: 5\' 2"  (1.575 m)    Body mass index is 22.2 kg/m.  Physical Exam Vitals reviewed.  Constitutional:      Appearance: Normal appearance. She is normal weight.  HENT:     Head: Normocephalic and atraumatic.  Cardiovascular:     Rate and Rhythm: Normal rate and regular rhythm.      Heart sounds: Normal heart sounds.  Pulmonary:     Effort: Pulmonary effort is normal.     Breath sounds: Normal breath sounds.  Abdominal:     General: Abdomen is flat. Bowel sounds are normal.     Palpations: Abdomen is soft.  Skin:    Capillary Refill: Capillary refill takes less than 2 seconds.     Comments: Skin normal with scar from trach.   Neurological:     General: No focal deficit present.     Mental Status: She is alert and oriented to person, place, and time.  Psychiatric:        Mood and Affect: Mood normal.        Behavior: Behavior normal.        Flowsheet Row Office Visit from 08/13/2020 in [facility]  PHQ-9 Total Score 1      Assessment/plan: 1. 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 Normal EEG/MRI. Weaned off dilantin and weaning off phenobarbital. Will continue keppra 1000mg  BID.   2. Benign essential HTN Blood pressure is to goal. Continue current anti-hypertensive medications-norvasc 5mg /day and lopressor 25mg  BID. Refills not given and routine lab work will be done today. Recommended routine exercise and healthy diet including DASH diet and mediterranean diet.  F/u in 12 months.   3. Depression, recurrent (HCC) phq9 score significantly improved. She is clinically doing better.   She will be released to work on 08/29/2020.   Recommended vitamin E oil to neck and cetaphil cream.    This visit occurred during the SARS-CoV-2 public health emergency.  Safety protocols were in place, including screening questions prior to the visit, additional usage of staff PPE, and extensive cleaning of exam room while observing appropriate contact time as indicated for disinfecting solutions.     Return in about 3 months (around 11/11/2020) for annual/pap .   , MD [facility]   08/13/2020

## 2020-08-13 NOTE — Telephone Encounter (Signed)
CONCLUSION:  This is a  normal awake EEG.  There is evidence of moderate background slowing, indicating moderate bihemispheric malfunction.  There was no evidence of epileptiform discharge.  Levert Feinstein, M.D. Ph.D.

## 2020-10-02 ENCOUNTER — Ambulatory Visit: Payer: Managed Care, Other (non HMO) | Admitting: Neurology

## 2020-10-18 ENCOUNTER — Ambulatory Visit: Payer: Managed Care, Other (non HMO) | Admitting: Neurology

## 2020-10-19 ENCOUNTER — Telehealth: Payer: Self-pay

## 2020-10-19 NOTE — Telephone Encounter (Signed)
Pt is requesting a call in regards to returning back to work. Pt was at work, got dizzy and fell, ems was called. Work will not let her return without note. Pt states she needs to work Sunday

## 2020-10-20 ENCOUNTER — Other Ambulatory Visit: Payer: Self-pay | Admitting: Family Medicine

## 2020-10-22 ENCOUNTER — Ambulatory Visit (INDEPENDENT_AMBULATORY_CARE_PROVIDER_SITE_OTHER): Payer: Managed Care, Other (non HMO) | Admitting: Family Medicine

## 2020-10-22 ENCOUNTER — Encounter: Payer: Self-pay | Admitting: Family Medicine

## 2020-10-22 ENCOUNTER — Emergency Department (HOSPITAL_COMMUNITY): Payer: Managed Care, Other (non HMO)

## 2020-10-22 ENCOUNTER — Other Ambulatory Visit: Payer: Self-pay

## 2020-10-22 ENCOUNTER — Inpatient Hospital Stay (HOSPITAL_COMMUNITY)
Admission: EM | Admit: 2020-10-22 | Discharge: 2020-10-27 | DRG: 100 | Disposition: A | Discharge status: Home / Self Care | Payer: Managed Care, Other (non HMO) | Attending: Internal Medicine | Admitting: Internal Medicine

## 2020-10-22 VITALS — BP 102/73 | HR 120 | Temp 97.6°F | Ht 62.0 in | Wt 111.6 lb

## 2020-10-22 DIAGNOSIS — R4182 Altered mental status, unspecified: Secondary | ICD-10-CM

## 2020-10-22 DIAGNOSIS — F32A Depression, unspecified: Secondary | ICD-10-CM | POA: Diagnosis present

## 2020-10-22 DIAGNOSIS — K859 Acute pancreatitis without necrosis or infection, unspecified: Secondary | ICD-10-CM | POA: Diagnosis present

## 2020-10-22 DIAGNOSIS — G9341 Metabolic encephalopathy: Secondary | ICD-10-CM | POA: Diagnosis present

## 2020-10-22 DIAGNOSIS — R410 Disorientation, unspecified: Secondary | ICD-10-CM

## 2020-10-22 DIAGNOSIS — Z20822 Contact with and (suspected) exposure to covid-19: Secondary | ICD-10-CM | POA: Diagnosis present

## 2020-10-22 DIAGNOSIS — Z825 Family history of asthma and other chronic lower respiratory diseases: Secondary | ICD-10-CM | POA: Diagnosis not present

## 2020-10-22 DIAGNOSIS — W19XXXA Unspecified fall, initial encounter: Secondary | ICD-10-CM | POA: Diagnosis present

## 2020-10-22 DIAGNOSIS — R519 Headache, unspecified: Secondary | ICD-10-CM | POA: Diagnosis not present

## 2020-10-22 DIAGNOSIS — Z91010 Allergy to peanuts: Secondary | ICD-10-CM

## 2020-10-22 DIAGNOSIS — R569 Unspecified convulsions: Secondary | ICD-10-CM

## 2020-10-22 DIAGNOSIS — J45909 Unspecified asthma, uncomplicated: Secondary | ICD-10-CM | POA: Diagnosis present

## 2020-10-22 DIAGNOSIS — Z87891 Personal history of nicotine dependence: Secondary | ICD-10-CM | POA: Diagnosis not present

## 2020-10-22 DIAGNOSIS — Z79899 Other long term (current) drug therapy: Secondary | ICD-10-CM

## 2020-10-22 DIAGNOSIS — G319 Degenerative disease of nervous system, unspecified: Secondary | ICD-10-CM | POA: Diagnosis present

## 2020-10-22 DIAGNOSIS — Z91040 Latex allergy status: Secondary | ICD-10-CM | POA: Diagnosis not present

## 2020-10-22 DIAGNOSIS — I6783 Posterior reversible encephalopathy syndrome: Secondary | ICD-10-CM | POA: Diagnosis present

## 2020-10-22 DIAGNOSIS — F1010 Alcohol abuse, uncomplicated: Secondary | ICD-10-CM | POA: Diagnosis present

## 2020-10-22 DIAGNOSIS — G40901 Epilepsy, unspecified, not intractable, with status epilepticus: Principal | ICD-10-CM | POA: Diagnosis present

## 2020-10-22 DIAGNOSIS — E876 Hypokalemia: Secondary | ICD-10-CM | POA: Diagnosis present

## 2020-10-22 DIAGNOSIS — Z888 Allergy status to other drugs, medicaments and biological substances status: Secondary | ICD-10-CM

## 2020-10-22 DIAGNOSIS — E871 Hypo-osmolality and hyponatremia: Secondary | ICD-10-CM | POA: Diagnosis present

## 2020-10-22 DIAGNOSIS — Z91013 Allergy to seafood: Secondary | ICD-10-CM

## 2020-10-22 DIAGNOSIS — I10 Essential (primary) hypertension: Secondary | ICD-10-CM | POA: Diagnosis present

## 2020-10-22 DIAGNOSIS — S0990XA Unspecified injury of head, initial encounter: Secondary | ICD-10-CM | POA: Diagnosis present

## 2020-10-22 DIAGNOSIS — Z8249 Family history of ischemic heart disease and other diseases of the circulatory system: Secondary | ICD-10-CM

## 2020-10-22 LAB — COMPREHENSIVE METABOLIC PANEL
ALT: 21 U/L (ref 0–44)
AST: 26 U/L (ref 15–41)
Albumin: 4.4 g/dL (ref 3.5–5.0)
Alkaline Phosphatase: 49 U/L (ref 38–126)
Anion gap: 11 (ref 5–15)
BUN: 17 mg/dL (ref 6–20)
CO2: 24 mmol/L (ref 22–32)
Calcium: 10.2 mg/dL (ref 8.9–10.3)
Chloride: 98 mmol/L (ref 98–111)
Creatinine, Ser: 0.75 mg/dL (ref 0.44–1.00)
GFR, Estimated: >60 mL/min (ref >60)
Glucose, Bld: 99 mg/dL (ref 70–99)
Potassium: 3.6 mmol/L (ref 3.5–5.1)
Sodium: 133 mmol/L — ABNORMAL LOW (ref 135–145)
Total Bilirubin: 0.5 mg/dL (ref 0.3–1.2)
Total Protein: 8.8 g/dL — ABNORMAL HIGH (ref 6.5–8.1)

## 2020-10-22 LAB — CBC WITH DIFFERENTIAL/PLATELET
Abs Immature Granulocytes: 0.02 10*3/uL (ref 0.00–0.07)
Basophils Absolute: 0 10*3/uL (ref 0.0–0.1)
Basophils Relative: 0 %
Eosinophils Absolute: 0 10*3/uL (ref 0.0–0.5)
Eosinophils Relative: 0 %
HCT: 36.1 % (ref 36.0–46.0)
Hemoglobin: 12.8 g/dL (ref 12.0–15.0)
Immature Granulocytes: 0 %
Lymphocytes Relative: 26 %
Lymphs Abs: 1.9 10*3/uL (ref 0.7–4.0)
MCH: 34.5 pg — ABNORMAL HIGH (ref 26.0–34.0)
MCHC: 35.5 g/dL (ref 30.0–36.0)
MCV: 97.3 fL (ref 80.0–100.0)
Monocytes Absolute: 1 10*3/uL (ref 0.1–1.0)
Monocytes Relative: 14 %
Neutro Abs: 4.5 10*3/uL (ref 1.7–7.7)
Neutrophils Relative %: 60 %
Platelets: 156 10*3/uL (ref 150–400)
RBC: 3.71 MIL/uL — ABNORMAL LOW (ref 3.87–5.11)
RDW: 12.7 % (ref 11.5–15.5)
WBC: 7.5 10*3/uL (ref 4.0–10.5)
nRBC: 0 % (ref 0.0–0.2)

## 2020-10-22 LAB — CBG MONITORING, ED: Glucose-Capillary: 78 mg/dL (ref 70–99)

## 2020-10-22 LAB — I-STAT BETA HCG BLOOD, ED (MC, WL, AP ONLY): I-stat hCG, quantitative: <5 m[IU]/mL (ref <5)

## 2020-10-22 LAB — AMMONIA: Ammonia: 24 umol/L (ref 9–35)

## 2020-10-22 LAB — LIPASE, BLOOD: Lipase: 75 U/L — ABNORMAL HIGH (ref 11–51)

## 2020-10-22 LAB — ETHANOL: Alcohol, Ethyl (B): <10 mg/dL (ref <10)

## 2020-10-22 MED ORDER — KETOROLAC TROMETHAMINE 30 MG/ML IJ SOLN
30.0000 mg | Freq: Once | INTRAMUSCULAR | Status: AC
  Administered 2020-10-22: 30 mg via INTRAVENOUS
  Filled 2020-10-22: qty 1

## 2020-10-22 MED ORDER — METOCLOPRAMIDE HCL 5 MG/ML IJ SOLN
10.0000 mg | Freq: Once | INTRAMUSCULAR | Status: AC
  Administered 2020-10-22: 10 mg via INTRAVENOUS
  Filled 2020-10-22: qty 2

## 2020-10-22 MED ORDER — ACETAMINOPHEN 650 MG RE SUPP: 650.0000 mg | Freq: Four times a day (QID) | RECTAL | Status: DC | PRN

## 2020-10-22 MED ORDER — SODIUM CHLORIDE 0.45 % IV SOLN: INTRAVENOUS | Status: DC

## 2020-10-22 MED ORDER — ACETAMINOPHEN 500 MG PO TABS
500.0000 mg | ORAL_TABLET | Freq: Once | ORAL | Status: AC
  Administered 2020-10-22: 500 mg via ORAL
  Filled 2020-10-22: qty 1

## 2020-10-22 MED ORDER — ENOXAPARIN SODIUM 40 MG/0.4ML ~~LOC~~ SOLN
40.0000 mg | SUBCUTANEOUS | Status: DC
  Administered 2020-10-22 – 2020-10-26 (×3): 40 mg via SUBCUTANEOUS
  Filled 2020-10-22 (×3): qty 0.4

## 2020-10-22 MED ORDER — FOLIC ACID 1 MG PO TABS
1.0000 mg | ORAL_TABLET | Freq: Every day | ORAL | Status: DC
  Administered 2020-10-23 – 2020-10-27 (×5): 1 mg via ORAL
  Filled 2020-10-22 (×5): qty 1

## 2020-10-22 MED ORDER — LEVETIRACETAM 500 MG PO TABS
1000.0000 mg | ORAL_TABLET | Freq: Two times a day (BID) | ORAL | Status: DC
  Administered 2020-10-22: 1000 mg via ORAL
  Filled 2020-10-22 (×2): qty 2

## 2020-10-22 MED ORDER — AMLODIPINE BESYLATE 5 MG PO TABS
5.0000 mg | ORAL_TABLET | Freq: Every day | ORAL | Status: DC
  Administered 2020-10-23 – 2020-10-27 (×5): 5 mg via ORAL
  Filled 2020-10-22 (×5): qty 1

## 2020-10-22 MED ORDER — ONDANSETRON HCL 4 MG/2ML IJ SOLN: 4.0000 mg | Freq: Four times a day (QID) | INTRAMUSCULAR | Status: DC | PRN

## 2020-10-22 MED ORDER — ACETAMINOPHEN 325 MG PO TABS
650.0000 mg | ORAL_TABLET | Freq: Four times a day (QID) | ORAL | Status: DC | PRN
  Administered 2020-10-24 (×2): 650 mg via ORAL
  Filled 2020-10-22 (×3): qty 2

## 2020-10-22 MED ORDER — METOPROLOL TARTRATE 25 MG PO TABS
25.0000 mg | ORAL_TABLET | Freq: Two times a day (BID) | ORAL | Status: DC
  Administered 2020-10-22 – 2020-10-27 (×9): 25 mg via ORAL
  Filled 2020-10-22 (×10): qty 1

## 2020-10-22 MED ORDER — LEVETIRACETAM IN NACL 1000 MG/100ML IV SOLN
1000.0000 mg | Freq: Once | INTRAVENOUS | Status: AC
  Administered 2020-10-22: 1000 mg via INTRAVENOUS
  Filled 2020-10-22: qty 100

## 2020-10-22 MED ORDER — SERTRALINE HCL 100 MG PO TABS
100.0000 mg | ORAL_TABLET | Freq: Every day | ORAL | Status: DC
  Administered 2020-10-23 – 2020-10-27 (×5): 100 mg via ORAL
  Filled 2020-10-22 (×5): qty 1

## 2020-10-22 MED ORDER — ONDANSETRON HCL 4 MG PO TABS: 4.0000 mg | ORAL_TABLET | Freq: Four times a day (QID) | ORAL | Status: DC | PRN

## 2020-10-22 MED ORDER — SODIUM CHLORIDE 0.9 % IV BOLUS
1000.0000 mL | Freq: Once | INTRAVENOUS | Status: AC
  Administered 2020-10-22: 1000 mL via INTRAVENOUS

## 2020-10-22 NOTE — ED Notes (Signed)
Patient has been transported to MRI. Patient will need a new IV when she returns

## 2020-10-22 NOTE — ED Triage Notes (Addendum)
Patient  did acknowledge that she fell and hit her head, but when asking questions in triage, the patient could not answer appropriately and would just stare at writer.  Unable to finish triage- patient not answering questions appropiately

## 2020-10-22 NOTE — H&P (Signed)
History and Physical   Donna CurlsShamika M Schoon WUJ:[id number] DOB: December 06, 1990 DOA: 10/22/2020  Referring MD/NP/PA: Dr Stevie Kernykstra  PCP: Orland MustardWolfe, Allison, MD   Outpatient Specialists: None   Patient coming from: Home  Chief Complaint: Headache and altered mental status  HPI: Donna CurlsShamika M Short is a 30 y.o. female with medical history significant of asthma, depression, hypertension, previous PR ES syndrome, pancreatitis and seizures currently on medication for seizure disorder who is in the ER with complaint of headaches and altered mental status.  Patient has prior history of alcohol abuse.  Patient reportedly fell to the ground and hit her head about 4 days ago.  She was doing fine until yesterday when she started having headaches the record.  She went to see her PCP today and was found to be confused and sent to the emergency department.  Her complaints mainly severe frontal headaches.  CT head showed no acute hemorrhage.  She is seem to be withdrawn and confused.  She denied any nausea or vomiting.  Case was discussed with neurologist who recommended loading patient with Keppra.  She was on antiseizure medicines as well.  That has been done and patient still remains confused.  Suspicion is also PR ES syndrome but blood pressure is not excessively elevated.  [facility] point [facility] be evaluated by [facility]y.  She will get EEG tonight and hopefully neurology will take over with recommendations for management..  ED Course: Temperature is 99.1 blood pressure 128/106 pulse 125 respirate 24 oxygen sat 90% room air.  CBC and chemistry largely within normal except sodium 133.  Head CT without contrast and CT cervical spine all without new findings.  MRI showed small regions of cortical and subcortical hyperintensity.  There is question of PRES syndrome versus chronic encephalomalacia.  Patient stable being admitted for further evaluation and treatment  Review of Systems: As per HPI  otherwise 10 point review of systems negative.    Past Medical History:  Diagnosis Date  . Asthma   . Depression   . History of chicken pox   . Hypertension   . Migraines   . Pancreatitis 01/2018  . Seizures (HCC)     Past Surgical History:  Procedure Laterality Date  . WISDOM TOOTH EXTRACTION       reports that she quit smoking about 2 years ago. Her smoking use included cigarettes. She has never used smokeless tobacco. She reports current alcohol use of about 9.0 standard drinks of alcohol per week. She reports that she does not use drugs.  Allergies  Allergen Reactions  . Claritin [Loratadine] Itching  . Peanut-Containing Drug Products Other (See Comments)  . Benadryl [Diphenhydramine Hcl (Sleep)] Rash  . Latex Rash  . Shrimp [Shellfish Allergy] Hives and Rash    Family History  Problem Relation Age of Onset  . Diabetes Father   . Arthritis Father   . Alcohol abuse Father   . Breast cancer Mother   . Asthma Mother   . Drug abuse Mother   . Hypertension Mother      Prior to Admission medications   Medication Sig Start Date End Date Taking? Authorizing Provider  amLODipine (NORVASC) 5 MG tablet TAKE ONE TABLET BY MOUTH DAILY 10/21/20   Orland MustardWolfe, Allison, MD  folic acid (FOLVITE) 1 MG tablet Take 1 tablet (1 mg total) by mouth daily. 06/27/20   Orland MustardWolfe, Allison, MD  levETIRAcetam (KEPPRA) 1000 MG tablet Take 1 tablet (1,000 mg total) by mouth 2 (  two) times daily. 07/18/20   Levert Feinstein, MD  metoprolol tartrate (LOPRESSOR) 50 MG tablet Take 0.5 tablets (25 mg total) by mouth 2 (two) times daily. 07/12/20   Orland Mustard, MD  Mouthwashes (MOUTH RINSE) LIQD solution 15 mLs by Mouth Rinse route 2 (two) times daily. 05/31/20   Jeanella Craze, NP  Multiple Vitamin (MULTIVITAMIN WITH MINERALS) TABS tablet Place 1 tablet into feeding tube daily. 06/01/20   Jeanella Craze, NP  PHENObarbital (LUMINAL) 15 MG tablet Take 6 tablets every night for 2 weeks, 5 tablets every night for 2  weeks 4 tablets every night for 2 weeks 3 tablets every  Night for 2 weeks 2 tablets every night for 2 weeks 1 tablet every night 07/18/20   Levert Feinstein, MD  sertraline (ZOLOFT) 100 MG tablet Take 1 tablet (100 mg total) by mouth daily. 07/12/20   Orland Mustard, MD    Physical Exam: Vitals:   10/22/20 2000 10/22/20 2017 10/22/20 2100 10/22/20 2120  BP: 106/80 107/85 100/69 111/83  Pulse: (!) 101 (!) 102 (!) 101 (!) 103  Resp: (!) 22 20 (!) 24 18  Temp:  98.6 F (37 C) 98.6 F (37 C)   TempSrc:  Oral Oral   SpO2: 100% 100% 100% 99%      Constitutional: Confused but no acute distress Vitals:   10/22/20 2000 10/22/20 2017 10/22/20 2100 10/22/20 2120  BP: 106/80 107/85 100/69 111/83  Pulse: (!) 101 (!) 102 (!) 101 (!) 103  Resp: (!) 22 20 (!) 24 18  Temp:  98.6 F (37 C) 98.6 F (37 C)   TempSrc:  Oral Oral   SpO2: 100% 100% 100% 99%   Eyes: PERRL, lids and conjunctivae normal ENMT: Mucous membranes are moist. Posterior pharynx clear of any exudate or lesions.Normal dentition.  Neck: normal, supple, no masses, no thyromegaly Respiratory: clear to auscultation bilaterally, no wheezing, no crackles. Normal respiratory effort. No accessory muscle use.  Cardiovascular: Sinus tachycardia, no murmurs / rubs / gallops. No extremity edema. 2+ pedal pulses. No carotid bruits.  Abdomen: no tenderness, no masses palpated. No hepatosplenomegaly. Bowel sounds positive.  Musculoskeletal: no clubbing / cyanosis. No joint deformity upper and lower extremities. Good ROM, no contractures. Normal muscle tone.  Skin: no rashes, lesions, ulcers. No induration Neurologic: CN 2-12 grossly intact. Sensation intact, DTR normal. Strength 5/5 in all 4.  Psychiatric: Confused, arousable, answering questions, not in any distress.     Labs on Admission: I have personally reviewed following labs and imaging studies  CBC: Recent Labs  Lab 10/22/20 1414  WBC 7.5  NEUTROABS 4.5  HGB 12.8  HCT 36.1   MCV 97.3  PLT 156   Basic Metabolic Panel: Recent Labs  Lab 10/22/20 1414  NA 133*  K 3.6  CL 98  CO2 24  GLUCOSE 99  BUN 17  CREATININE 0.75  CALCIUM 10.2   GFR: Estimated Creatinine Clearance: 80.6 mL/min (by C-G formula based on SCr of 0.75 mg/dL). Liver Function Tests: Recent Labs  Lab 10/22/20 1414  AST 26  ALT 21  ALKPHOS 49  BILITOT 0.5  PROT 8.8*  ALBUMIN 4.4   Recent Labs  Lab 10/22/20 1414  LIPASE 75*   Recent Labs  Lab 10/22/20 1414  AMMONIA 24   Coagulation Profile: No results for input(s): INR, PROTIME in the last 168 hours. Cardiac Enzymes: No results for input(s): CKTOTAL, CKMB, CKMBINDEX, TROPONINI in the last 168 hours. BNP (last 3 results) No results for input(s): PROBNP  in the last 8760 hours. HbA1C: No results for input(s): HGBA1C in the last 72 hours. CBG: Recent Labs  Lab 10/22/20 1400  GLUCAP 78   Lipid Profile: No results for input(s): CHOL, HDL, LDLCALC, TRIG, CHOLHDL, LDLDIRECT in the last 72 hours. Thyroid Function Tests: No results for input(s): TSH, T4TOTAL, FREET4, T3FREE, THYROIDAB in the last 72 hours. Anemia Panel: No results for input(s): VITAMINB12, FOLATE, FERRITIN, TIBC, IRON, RETICCTPCT in the last 72 hours. Urine analysis:    Component Value Date/Time   COLORURINE YELLOW 06/08/2020 1350   APPEARANCEUR HAZY (A) 06/08/2020 1350   LABSPEC 1.015 06/08/2020 1350   PHURINE 7.0 06/08/2020 1350   GLUCOSEU NEGATIVE 06/08/2020 1350   HGBUR NEGATIVE 06/08/2020 1350   BILIRUBINUR NEGATIVE 06/08/2020 1350   KETONESUR NEGATIVE 06/08/2020 1350   PROTEINUR 100 (A) 06/08/2020 1350   UROBILINOGEN 0.2 10/07/2015 1958   NITRITE NEGATIVE 06/08/2020 1350   LEUKOCYTESUR NEGATIVE 06/08/2020 1350   Sepsis Labs: @LABRCNTIP (procalcitonin:4,lacticidven:4) )No results found for this or any previous visit (from the past 240 hour(s)).   Radiological Exams on Admission: CT Head Wo Contrast  Result Date: 10/22/2020 CLINICAL  DATA:  Fall, head injury EXAM: CT HEAD WITHOUT CONTRAST CT CERVICAL SPINE WITHOUT CONTRAST TECHNIQUE: Multidetector CT imaging of the head and cervical spine was performed following the standard protocol without intravenous contrast. Multiplanar CT image reconstructions of the cervical spine were also generated. COMPARISON:  CT head 05/03/2015.  MRI head 07/26/2020 FINDINGS: CT HEAD FINDINGS Brain: No evidence of acute infarction, hemorrhage, hydrocephalus, extra-axial collection or mass lesion/mass effect. Vascular: Negative for hyperdense vessel Skull: Negative Sinuses/Orbits: Paranasal sinuses clear.  Negative orbit Other: None CT CERVICAL SPINE FINDINGS Alignment: Normal alignment Skull base and vertebrae: Negative for fracture Soft tissues and spinal canal: Negative Disc levels: Normal disc spaces. No significant degenerative change. Upper chest: Lung apices clear bilaterally. Other: None IMPRESSION: Negative CT head Negative CT cervical spine Electronically Signed   By: 14/09/2019 M.D.   On: 10/22/2020 15:22   CT Cervical Spine Wo Contrast  Result Date: 10/22/2020 CLINICAL DATA:  Fall, head injury EXAM: CT HEAD WITHOUT CONTRAST CT CERVICAL SPINE WITHOUT CONTRAST TECHNIQUE: Multidetector CT imaging of the head and cervical spine was performed following the standard protocol without intravenous contrast. Multiplanar CT image reconstructions of the cervical spine were also generated. COMPARISON:  CT head 05/03/2015.  MRI head 07/26/2020 FINDINGS: CT HEAD FINDINGS Brain: No evidence of acute infarction, hemorrhage, hydrocephalus, extra-axial collection or mass lesion/mass effect. Vascular: Negative for hyperdense vessel Skull: Negative Sinuses/Orbits: Paranasal sinuses clear.  Negative orbit Other: None CT CERVICAL SPINE FINDINGS Alignment: Normal alignment Skull base and vertebrae: Negative for fracture Soft tissues and spinal canal: Negative Disc levels: Normal disc spaces. No significant degenerative  change. Upper chest: Lung apices clear bilaterally. Other: None IMPRESSION: Negative CT head Negative CT cervical spine Electronically Signed   By: 14/09/2019 M.D.   On: 10/22/2020 15:22   MR BRAIN WO CONTRAST  Result Date: 10/22/2020 CLINICAL DATA:  Head trauma, abnormal mental status.  Possible PRES. EXAM: MRI HEAD WITHOUT CONTRAST TECHNIQUE: Multiplanar, multiecho pulse sequences of the brain and surrounding structures were obtained without intravenous contrast. COMPARISON:  CT head/cervical spine performed earlier today 10/22/2020. Prior brain MRI examinations 07/26/2020 and earlier. FINDINGS: Brain: Mild cerebral and cerebellar atrophy which is advanced for age. Small regions of cortical/subcortical T2/FLAIR hyperintense signal abnormality within the left parietal and occipital lobes (series 7, images 23-26) (series 7, image 16). These findings were not  definitively present on the prior brain MRI of 07/26/2020. Foci of chronic microhemorrhage within the posterior right frontal lobe and right parietal lobe (2 foci total), unchanged from the brain MRI of 07/26/2020. There is no acute infarct. No evidence of intracranial mass. No chronic intracranial blood products. No extra-axial fluid collection. No midline shift. Vascular: Expected proximal arterial flow voids. Skull and upper cervical spine: No focal marrow lesion. Sinuses/Orbits: Visualized orbits show no acute finding. Trace bilateral ethmoid sinus mucosal thickening. IMPRESSION: Small regions of cortical/subcortical T2/FLAIR hyperintensity within the left parietal and occipital lobes. These findings were not definitively present on the prior MRI of 07/26/2020. Primary differential considerations in this patient include early signal changes related to hypertensive encephalopathy/PRES versus subtle chronic encephalomalacia from prior episodes (seen to better advantage on today's exam). Stable chronic microhemorrhages within the right frontal and  parietal lobes (2 total). Mild cerebral and cerebellar atrophy, advanced for age. Electronically Signed   By: Jackey Loge DO   On: 10/22/2020 19:15    EKG: Independently reviewed.  It shows sinus tachycardia with a rate of 122, some nonspecific T wave changes with T wave inversion in lead III.  Assessment/Plan Principal Problem:   Seizure (HCC) Active Problems:   Benign essential HTN   ETOH abuse   Acute metabolic encephalopathy   PRES (posterior reversible encephalopathy syndrome)     #1 altered mental status: Secondary metabolic encephalopathy.  Suspected seizure also.  Patient at this point will be admitted to [facility].  Neurology will see patient and evaluate.  EEG will be done.  #2  Seizure disorder: Patient will get EEG tonight and will be followed by neurology.  #3 history of PR ES syndrome: Suspicion for this being recurrence.  Will defer to neurology.  #4 benign essential hypertension: Continue blood pressure management.   DVT prophylaxis: Lovenox Code Status: Full code Family Communication: Mother other family members Disposition Plan: Home Consults called: Dr. Amada Jupiter, neurology Admission status: Inpatient  Severity of Illness: The appropriate patient status for this patient is INPATIENT. Inpatient status is judged to be reasonable and necessary in order to provide the required intensity of service to ensure the patient's safety. The patient's presenting symptoms, physical exam findings, and initial radiographic and laboratory data in the context of their chronic comorbidities is felt to place them at high risk for further clinical deterioration. Furthermore, it is not anticipated that the patient will be medically stable for discharge from the hospital within 2 midnights of admission. The following factors support the patient status of inpatient.   " The patient's presenting symptoms include headaches and weakness. " The worrisome physical exam findings  include confusion. " The initial radiographic and laboratory data are worrisome because of MRI findings. " The chronic co-morbidities include seizure disorder.   * I certify that at the point of admission it is my clinical judgment that the patient will require inpatient hospital care spanning beyond 2 midnights from the point of admission due to high intensity of service, high risk for further deterioration and high frequency of surveillance required.Lonia Blood MD [facility] Pager [phone]  If 7PM-7AM, please contact night-coverage [email or web address] Password [facility]  10/22/2020, 9:43 PM

## 2020-10-22 NOTE — Progress Notes (Signed)
Patient: Donna Short MRN: [id number] DOB: [birth year] PCP: Orland Mustard, MD     Subjective:  Chief Complaint  Patient presents with  . Letter for School/Work    Pt says that she fell at work. She sounds confused, and discombobulated.     HPI: The patient is a 30 y.o. female who presents today for a note for work. She fell at work. She states she fell down, but did not have a seizure. Ems was called and she did not go to the hospital. She needs a note to go back to work. She is not making a lot of sense today. She states she has not been drinking any alcohol. She was tired when she was working. She is very confused and can not tell me what happened. She keeps saying im talking to you right now. Nothing happened Friday. Can't tell me anything that happened. She keeps saying, "you are talking back to me" when I ask her any question. Unable to really get any history from her. She appears drowsy as well.   Review of Systems  Constitutional: Negative for chills, fatigue and fever.  Eyes: Negative for visual disturbance.  Respiratory: Negative for cough, chest tightness and shortness of breath.   Cardiovascular: Negative for chest pain, palpitations and leg swelling.  Gastrointestinal: Negative for abdominal pain.  Neurological: Negative for headaches.  Psychiatric/Behavioral: Positive for confusion.    Allergies Patient is allergic to claritin [loratadine], peanut-containing drug products, benadryl [diphenhydramine hcl (sleep)], latex, and shrimp [shellfish allergy].  Past Medical History Patient  has a past medical history of Asthma, Depression, History of chicken pox, Hypertension, Migraines, Pancreatitis (01/2018), and Seizures (HCC).  Surgical History Patient  has a past surgical history that includes Wisdom tooth extraction.  Family History Pateint's family history includes Alcohol abuse in her father; Arthritis in her father; Asthma in her mother; Breast cancer in her  mother; Diabetes in her father; Drug abuse in her mother; Hypertension in her mother.  Social History Patient  reports that she quit smoking about 2 years ago. Her smoking use included cigarettes. She has never used smokeless tobacco. She reports current alcohol use of about 9.0 standard drinks of alcohol per week. She reports that she does not use drugs.    Objective: Vitals:   10/22/20 1202  BP: 102/73  Pulse: (!) 120  Temp: 97.6 F (36.4 C)  TempSrc: Temporal  SpO2: 100%  Weight: 111 lb 9.6 oz (50.6 kg)  Height: 5\' 2"  (1.575 m)    Body mass index is 20.41 kg/m.  Physical Exam Vitals reviewed.  Constitutional:      General: She is not in acute distress.    Appearance: Normal appearance. She is not ill-appearing.  HENT:     Head: Normocephalic and atraumatic.  Cardiovascular:     Rate and Rhythm: Tachycardia present.     Heart sounds: Normal heart sounds.  Pulmonary:     Effort: Pulmonary effort is normal.     Breath sounds: Normal breath sounds.  Abdominal:     General: Bowel sounds are normal.     Palpations: Abdomen is soft.  Musculoskeletal:     Cervical back: Normal range of motion and neck supple.  Neurological:     General: No focal deficit present.     Mental Status: She is alert.     Comments: Can not really follow directions well enough to do exam.   Psychiatric:     Comments: Confused, can not answer questions.  Assessment/plan: 1. Confusion Can not get any history out of her. Her friend is here, Mr. Ivar Drape, who states she hit her head at work on Friday. He has stayed with her to make sure she Is okay and is driving her today. He has not witnessed any seizures.  She was fine on Saturday and then yesterday and today has not made much sense. He denies her drinking. Discussed if she hit her head and with this much confusion needs emergent care/imaging of her head/labs. He will take her over to ER immediately. Vitals stable and in NAD okay to go via  personal vehicle. They declined EMS.   Sent to ER.    This visit occurred during the SARS-CoV-2 public health emergency.  Safety protocols were in place, including screening questions prior to the visit, additional usage of staff PPE, and extensive cleaning of exam room while observing appropriate contact time as indicated for disinfecting solutions.      Return if symptoms worsen or fail to improve.    Orland Mustard, MD [facility]   10/22/2020

## 2020-10-22 NOTE — ED Provider Notes (Addendum)
Signout note  30 year old lady who presents to the ER with concern for headache, altered mental status.  Extensive history of seizures, PRES. CT head negative.  Case reviewed with neurology, Dr. Amada Jupiter recommend loading with Keppra and obtaining MRI.  Further recommendations pending MRI results.  6:30 PM Patient still somewhat confused, headache resolved, vitals stable, discussed with Uvaldo Rising, call back when MRI results  8:00 PM reviewed with Dr. Thomasena Edis, recommends admission, transfer to [facility] for EEG; will c/s hospitalist for admission   Milagros Loll, MD 10/22/20 2028    Milagros Loll, MD 10/22/20 2028

## 2020-10-22 NOTE — ED Provider Notes (Signed)
[facility]-EMERGENCY DEPT Provider Note   CSN: [id number] Arrival date & time: 10/22/20  1339     History Chief Complaint  Patient presents with  . Fall    Donna Short is a 30 y.o. female.  The history is provided by the patient and medical records. No language interpreter was used.  Head Injury Location:  Frontal Time since incident:  4 days Mechanism of injury: fall   Fall:    Fall occurred:  Standing   Impact surface:  Hard floor   Point of impact:  Head   Entrapped after fall: no   Pain details:    Quality:  Aching   Severity:  Moderate   Duration:  2 days   Timing:  Constant   Progression:  Unchanged Chronicity:  New Relieved by:  Nothing Worsened by:  Nothing Ineffective treatments:  None tried Associated symptoms: blurred vision, disorientation and headache   Associated symptoms: no difficulty breathing, no double vision, no hearing loss, no nausea, no neck pain, no numbness and no vomiting  Seizures: hx of no new reported.        Past Medical History:  Diagnosis Date  . Asthma   . Depression   . History of chicken pox   . Hypertension   . Migraines   . Pancreatitis 01/2018  . Seizures [facility])     Patient Active Problem List   Diagnosis Date Noted  . PRES (posterior reversible encephalopathy syndrome) 07/18/2020  . Acute on chronic respiratory failure with hypoxia (HCC) 06/04/2020  . Acute metabolic encephalopathy 06/04/2020  . Status post tracheostomy (HCC)   . 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 Pancreatitis, acute 05/11/2020  . AKI (acute kidney injury) (HCC) 05/10/2020  . Depression, recurrent (HCC) 12/26/2019  . Acute pancreatitis 11/17/2019  . Acute on chronic pancreatitis (HCC) 02/24/2019  . Uncomplicated asthma   . Benign essential HTN   . Tachycardia   . ETOH abuse   . Respiratory failure (HCC)   . Encephalopathy acute   . Seizures (HCC) 12/19/2015    Past Surgical History:   Procedure Laterality Date  . WISDOM TOOTH EXTRACTION       OB History   No obstetric history on file.     Family History  Problem Relation Age of Onset  . Diabetes Father   . Arthritis Father   . Alcohol abuse Father   . Breast cancer Mother   . Asthma Mother   . Drug abuse Mother   . Hypertension Mother     Social History   Tobacco Use  . Smoking status: Former Smoker    Types: Cigarettes    Quit date: 01/21/2018    Years since quitting: 2.7  . Smokeless tobacco: Never Used  Vaping Use  . Vaping Use: Never used  Substance Use Topics  . Alcohol use: Yes    Alcohol/week: 9.0 standard drinks    Types: 9 Shots of liquor per week    Comment: 02/2019 "  2 shots every 3 or 4 days "  . Drug use: No    Home Medications Prior to Admission medications   Medication Sig Start Date End Date Taking? Authorizing Provider  amLODipine (NORVASC) 5 MG tablet TAKE ONE TABLET BY MOUTH DAILY 10/21/20   Orland Mustard, MD  folic acid (FOLVITE) 1 MG tablet Take 1 tablet (1 mg total) by mouth daily. 06/27/20   Orland Mustard, MD  levETIRAcetam (KEPPRA) 1000 MG tablet Take 1 tablet (  1,000 mg total) by mouth 2 (two) times daily. 07/18/20   Levert Feinstein, MD  metoprolol tartrate (LOPRESSOR) 50 MG tablet Take 0.5 tablets (25 mg total) by mouth 2 (two) times daily. 07/12/20   Orland Mustard, MD  Mouthwashes (MOUTH RINSE) LIQD solution 15 mLs by Mouth Rinse route 2 (two) times daily. 05/31/20   Jeanella Craze, NP  Multiple Vitamin (MULTIVITAMIN WITH MINERALS) TABS tablet Place 1 tablet into feeding tube daily. 06/01/20   Jeanella Craze, NP  PHENObarbital (LUMINAL) 15 MG tablet Take 6 tablets every night for 2 weeks, 5 tablets every night for 2 weeks 4 tablets every night for 2 weeks 3 tablets every  Night for 2 weeks 2 tablets every night for 2 weeks 1 tablet every night 07/18/20   Levert Feinstein, MD  sertraline (ZOLOFT) 100 MG tablet Take 1 tablet (100 mg total) by mouth daily. 07/12/20   Orland Mustard, MD    Allergies    Claritin [loratadine], Peanut-containing drug products, Benadryl [diphenhydramine hcl (sleep)], Latex, and Shrimp [shellfish allergy]  Review of Systems   Review of Systems  Unable to perform ROS: Mental status change  Constitutional: Negative for chills, diaphoresis, fatigue and fever.  HENT: Negative for congestion and hearing loss.   Eyes: Positive for blurred vision and visual disturbance. Negative for double vision.  Respiratory: Negative for cough, chest tightness and shortness of breath.   Cardiovascular: Negative for chest pain.  Gastrointestinal: Negative for abdominal pain, nausea and vomiting.  Genitourinary: Negative for dysuria.  Musculoskeletal: Negative for back pain, neck pain and neck stiffness.  Skin: Negative for rash and wound.  Neurological: Positive for headaches. Negative for dizziness, weakness, light-headedness and numbness. Seizures: hx of no new reported.  Psychiatric/Behavioral: Positive for confusion. Negative for agitation.    Physical Exam Updated Vital Signs BP (!) 125/98   Pulse (!) 125   Temp 98.9 F (37.2 C) (Oral)   Resp 14   SpO2 100%   Physical Exam Vitals and nursing note reviewed.  Constitutional:      General: She is not in acute distress.    Appearance: She is well-developed and well-nourished. She is not ill-appearing, toxic-appearing or diaphoretic.  HENT:     Nose: No congestion or rhinorrhea.     Mouth/Throat:     Mouth: Mucous membranes are moist.     Pharynx: No oropharyngeal exudate or posterior oropharyngeal erythema.  Eyes:     Extraocular Movements: Extraocular movements intact.     Conjunctiva/sclera: Conjunctivae normal.     Pupils: Pupils are equal, round, and reactive to light.  Cardiovascular:     Rate and Rhythm: Normal rate and regular rhythm.     Pulses: Normal pulses.     Heart sounds: No murmur heard.   Pulmonary:     Effort: Pulmonary effort is normal. No respiratory  distress.     Breath sounds: Normal breath sounds. No wheezing, rhonchi or rales.  Chest:     Chest wall: No tenderness.  Abdominal:     General: Abdomen is flat.     Palpations: Abdomen is soft.     Tenderness: There is no abdominal tenderness. There is no right CVA tenderness, guarding or rebound.  Musculoskeletal:        General: Tenderness present. No edema.     Cervical back: Neck supple. No tenderness.     Right lower leg: No edema.     Left lower leg: No edema.  Skin:    General: Skin  is warm and dry.     Capillary Refill: Capillary refill takes less than 2 seconds.     Findings: No erythema.  Neurological:     Mental Status: She is alert. She is disoriented and confused.     Cranial Nerves: No dysarthria or facial asymmetry.     Sensory: No sensory deficit.     Motor: No weakness or abnormal muscle tone.     Comments: Some confusion.   Psychiatric:        Mood and Affect: Mood and affect normal.     ED Results / Procedures / Treatments   Labs (all labs ordered are listed, but only abnormal results are displayed) Labs Reviewed  CBC WITH DIFFERENTIAL/PLATELET - Abnormal; Notable for the following components:      Result Value   RBC 3.71 (*)    MCH 34.5 (*)    All other components within normal limits  COMPREHENSIVE METABOLIC PANEL - Abnormal; Notable for the following components:   Sodium 133 (*)    Total Protein 8.8 (*)    All other components within normal limits  LIPASE, BLOOD - Abnormal; Notable for the following components:   Lipase 75 (*)    All other components within normal limits  AMMONIA  ETHANOL  RAPID URINE DRUG SCREEN, HOSP PERFORMED  LEVETIRACETAM LEVEL  CBG MONITORING, ED  I-STAT BETA HCG BLOOD, ED ([facility], [facility], [facility] ONLY)    EKG EKG Interpretation  Date/Time:  Monday October 22 2020 14:31:07 EST Ventricular Rate:  122 PR Interval:    QRS Duration: 70 QT Interval:  314 QTC Calculation: 448 R Axis:   43 Text Interpretation: Sinus tachycardia  Borderline repolarization abnormality Borderline ST elevation, lateral leads when compared to prior, new t wave inversion in lead 3. No STEMI Confirmed by Theda Belfast ([id number]) on 10/22/2020 3:03:57 PM   Radiology CT Head Wo Contrast  Result Date: 10/22/2020 CLINICAL DATA:  Fall, head injury EXAM: CT HEAD WITHOUT CONTRAST CT CERVICAL SPINE WITHOUT CONTRAST TECHNIQUE: Multidetector CT imaging of the head and cervical spine was performed following the standard protocol without intravenous contrast. Multiplanar CT image reconstructions of the cervical spine were also generated. COMPARISON:  CT head 05/03/2015.  MRI head 07/26/2020 FINDINGS: CT HEAD FINDINGS Brain: No evidence of acute infarction, hemorrhage, hydrocephalus, extra-axial collection or mass lesion/mass effect. Vascular: Negative for hyperdense vessel Skull: Negative Sinuses/Orbits: Paranasal sinuses clear.  Negative orbit Other: None CT CERVICAL SPINE FINDINGS Alignment: Normal alignment Skull base and vertebrae: Negative for fracture Soft tissues and spinal canal: Negative Disc levels: Normal disc spaces. No significant degenerative change. Upper chest: Lung apices clear bilaterally. Other: None IMPRESSION: Negative CT head Negative CT cervical spine Electronically Signed   By: Marlan Palau M.D.   On: 10/22/2020 15:22   CT Cervical Spine Wo Contrast  Result Date: 10/22/2020 CLINICAL DATA:  Fall, head injury EXAM: CT HEAD WITHOUT CONTRAST CT CERVICAL SPINE WITHOUT CONTRAST TECHNIQUE: Multidetector CT imaging of the head and cervical spine was performed following the standard protocol without intravenous contrast. Multiplanar CT image reconstructions of the cervical spine were also generated. COMPARISON:  CT head 05/03/2015.  MRI head 07/26/2020 FINDINGS: CT HEAD FINDINGS Brain: No evidence of acute infarction, hemorrhage, hydrocephalus, extra-axial collection or mass lesion/mass effect. Vascular: Negative for hyperdense vessel Skull: Negative  Sinuses/Orbits: Paranasal sinuses clear.  Negative orbit Other: None CT CERVICAL SPINE FINDINGS Alignment: Normal alignment Skull base and vertebrae: Negative for fracture Soft tissues and spinal canal: Negative Disc levels: Normal disc  spaces. No significant degenerative change. Upper chest: Lung apices clear bilaterally. Other: None IMPRESSION: Negative CT head Negative CT cervical spine Electronically Signed   By: Marlan Palau M.D.   On: 10/22/2020 15:22    Procedures Procedures   Medications Ordered in ED Medications  ketorolac (TORADOL) 30 MG/ML injection 30 mg (has no administration in time range)  metoCLOPramide (REGLAN) injection 10 mg (has no administration in time range)  levETIRAcetam (KEPPRA) IVPB 1000 mg/100 mL premix (has no administration in time range)  sodium chloride 0.9 % bolus 1,000 mL (has no administration in time range)    ED Course  I have reviewed the triage vital signs and the nursing notes.  Pertinent labs & imaging results that were available during my care of the patient were reviewed by me and considered in my medical decision making (see chart for details).    MDM Rules/Calculators/A&P                          Mariana ISHANA BLADES is a 30 y.o. female with a past medical history significant for prior pancreatitis, prior encephalopathy, seizures on Keppra and phenobarbital, depression, migraines, prior PRES, and prior alcohol abuse who presents with altered mental status after head trauma.  According to patient, she had a fall onto the ground and hit her head on Friday, 4 days ago.  She says that she was doing well but yesterday started having headache recurrence.  Today, she went to PCP where she was found to be altered and sent to the emergency department for evaluation.  She is complaining of severe frontal headache and is confused and altered.  According to the triage provider, she was zoned out and not answering questions.  For me, she is answering some  questions.  She is tearful and crying.  She is denying any chest pain or abdominal pain, denying nausea and vomiting.  She initially did not member having history of seizures but when asked she does state that she takes seizure medications.  Patient's family arrived and is unable to provide much further information.  They report the patient was complained of headache yesterday and was "talking abnormally" and he could not understand her.  She went to her doctor today and they told her to come here for further evaluation.  She is repetitive with her speech and is confused.  She is reporting some blurred vision with her headache.  She denies double vision, nausea, or vomiting.  She denies any chest pain or abdominal pain.  On exam, patient has intact strength and sensation in extremities.  Intact sensation and strength of face with no facial droop.  Normal extraocular movements and pupils are symmetric and reactive.  Speech is not dysarthric but is repetitive with speech and is still somewhat confused.  Neck is nontender patient does have some tenderness on the front of her head.  Back is nontender.  With her reported head injury and this altered mental status and speech difficulty, will get CT head to look for acute hemorrhage or fracture.  Also get neck imaging.  We will get screening labs as her chart does show she has had some encephalopathy in the past.  Patient is denying any infectious symptoms such as fevers, chills, neck stiffness, cough, or rashes.  Doubt infection at this time.  Given patient's confusion, unclear if she is been taken off her seizure medications after this fall.  Anticipate reassessment for work-up.  Patient CT head  and neck do not show any acute abnormality such as fracture or bleeding.  I spoke with neurology and Dr. Amada JupiterKirkpatrick recommends MRI brain without as there still could be concern for PRES. he says that after she gets the MRI, she will likely need to be transferred ED  to ED to get EEG tonight and discuss further management from there.  We will await MRI and then transfer to further work-up tonight.  He also recommended loading with Keppra and giving a headache cocktail with Reglan and Toradol and fluids.  Care transferred to oncoming team while awaiting MRI and transfer for EEG and neurology eval.  care transferred in stable condition..   Final Clinical Impression(s) / ED Diagnoses Final diagnoses:  Altered mental status, unspecified altered mental status type  Fall, initial encounter  Acute nonintractable headache, unspecified headache type    Clinical Impression: 1. Altered mental status, unspecified altered mental status type   2. Fall, initial encounter   3. Acute nonintractable headache, unspecified headache type     Disposition: Care transferred to oncoming team while awaiting MRI and transfer for EEG and neurology eval.  care transferred in stable condition..   This note was prepared with assistance of Dragon voice recognition software. Occasional wrong-word or sound-a-like substitutions may have occurred due to the inherent limitations of voice recognition software.      Tegeler, Canary Brimhristopher J, MD 10/22/20 [phone]

## 2020-10-23 ENCOUNTER — Inpatient Hospital Stay (HOSPITAL_COMMUNITY): Payer: Managed Care, Other (non HMO)

## 2020-10-23 DIAGNOSIS — R569 Unspecified convulsions: Secondary | ICD-10-CM

## 2020-10-23 LAB — BASIC METABOLIC PANEL
Anion gap: 10 (ref 5–15)
BUN: 17 mg/dL (ref 6–20)
CO2: 18 mmol/L — ABNORMAL LOW (ref 22–32)
Calcium: 8.6 mg/dL — ABNORMAL LOW (ref 8.9–10.3)
Chloride: 108 mmol/L (ref 98–111)
Creatinine, Ser: 1.15 mg/dL — ABNORMAL HIGH (ref 0.44–1.00)
GFR, Estimated: >60 mL/min (ref >60)
Glucose, Bld: 193 mg/dL — ABNORMAL HIGH (ref 70–99)
Potassium: 4 mmol/L (ref 3.5–5.1)
Sodium: 136 mmol/L (ref 135–145)

## 2020-10-23 LAB — URINALYSIS, ROUTINE W REFLEX MICROSCOPIC
Bilirubin Urine: NEGATIVE
Glucose, UA: NEGATIVE mg/dL
Hgb urine dipstick: NEGATIVE
Ketones, ur: NEGATIVE mg/dL
Leukocytes,Ua: NEGATIVE
Nitrite: NEGATIVE
Protein, ur: NEGATIVE mg/dL
Specific Gravity, Urine: 1.019 (ref 1.005–1.030)
pH: 5 (ref 5.0–8.0)

## 2020-10-23 LAB — COMPREHENSIVE METABOLIC PANEL
ALT: 18 U/L (ref 0–44)
AST: 27 U/L (ref 15–41)
Albumin: 3.3 g/dL — ABNORMAL LOW (ref 3.5–5.0)
Alkaline Phosphatase: 35 U/L — ABNORMAL LOW (ref 38–126)
Anion gap: 13 (ref 5–15)
BUN: 14 mg/dL (ref 6–20)
CO2: 19 mmol/L — ABNORMAL LOW (ref 22–32)
Calcium: 9.2 mg/dL (ref 8.9–10.3)
Chloride: 100 mmol/L (ref 98–111)
Creatinine, Ser: 0.86 mg/dL (ref 0.44–1.00)
GFR, Estimated: >60 mL/min (ref >60)
Glucose, Bld: 82 mg/dL (ref 70–99)
Potassium: 3.2 mmol/L — ABNORMAL LOW (ref 3.5–5.1)
Sodium: 132 mmol/L — ABNORMAL LOW (ref 135–145)
Total Bilirubin: 0.6 mg/dL (ref 0.3–1.2)
Total Protein: 6.5 g/dL (ref 6.5–8.1)

## 2020-10-23 LAB — RAPID URINE DRUG SCREEN, HOSP PERFORMED
Amphetamines: NOT DETECTED
Barbiturates: POSITIVE — AB
Benzodiazepines: NOT DETECTED
Cocaine: NOT DETECTED
Opiates: NOT DETECTED
Tetrahydrocannabinol: NOT DETECTED

## 2020-10-23 LAB — CBC
HCT: 29.8 % — ABNORMAL LOW (ref 36.0–46.0)
Hemoglobin: 10.7 g/dL — ABNORMAL LOW (ref 12.0–15.0)
MCH: 34.5 pg — ABNORMAL HIGH (ref 26.0–34.0)
MCHC: 35.9 g/dL (ref 30.0–36.0)
MCV: 96.1 fL (ref 80.0–100.0)
Platelets: 142 10*3/uL — ABNORMAL LOW (ref 150–400)
RBC: 3.1 MIL/uL — ABNORMAL LOW (ref 3.87–5.11)
RDW: 12.8 % (ref 11.5–15.5)
WBC: 5.7 10*3/uL (ref 4.0–10.5)
nRBC: 0 % (ref 0.0–0.2)

## 2020-10-23 LAB — PHENYTOIN LEVEL, TOTAL
Phenytoin Lvl: <2.5 ug/mL — ABNORMAL LOW (ref 10.0–20.0)
Phenytoin Lvl: 16.6 ug/mL (ref 10.0–20.0)

## 2020-10-23 LAB — SARS CORONAVIRUS 2 (TAT 6-24 HRS): SARS Coronavirus 2: NEGATIVE

## 2020-10-23 LAB — PHENOBARBITAL LEVEL: Phenobarbital: 16.8 ug/mL (ref 15.0–30.0)

## 2020-10-23 LAB — LIPASE, BLOOD: Lipase: 64 U/L — ABNORMAL HIGH (ref 11–51)

## 2020-10-23 MED ORDER — SODIUM CHLORIDE 0.9 % IV SOLN
20.0000 mg/kg | INTRAVENOUS | Status: AC
  Administered 2020-10-23: 1032 mg via INTRAVENOUS
  Filled 2020-10-23: qty 20.64

## 2020-10-23 MED ORDER — SODIUM CHLORIDE 0.9 % IV SOLN: INTRAVENOUS | Status: DC

## 2020-10-23 MED ORDER — POTASSIUM CHLORIDE 10 MEQ/100ML IV SOLN
10.0000 meq | INTRAVENOUS | Status: AC
  Administered 2020-10-23: 10 meq via INTRAVENOUS
  Filled 2020-10-23 (×2): qty 100

## 2020-10-23 MED ORDER — PHENYTOIN SODIUM 50 MG/ML IJ SOLN
100.0000 mg | Freq: Three times a day (TID) | INTRAMUSCULAR | Status: DC
  Administered 2020-10-23 – 2020-10-25 (×5): 100 mg via INTRAVENOUS
  Filled 2020-10-23 (×5): qty 2

## 2020-10-23 MED ORDER — SODIUM CHLORIDE 0.9 % IV BOLUS
500.0000 mL | Freq: Once | INTRAVENOUS | Status: AC
  Administered 2020-10-23: 500 mL via INTRAVENOUS

## 2020-10-23 MED ORDER — PHENOBARBITAL SODIUM 130 MG/ML IJ SOLN
130.0000 mg | Freq: Every day | INTRAMUSCULAR | Status: DC
  Administered 2020-10-23 – 2020-10-25 (×3): 130 mg via INTRAVENOUS
  Filled 2020-10-23 (×3): qty 1

## 2020-10-23 MED ORDER — POTASSIUM CHLORIDE 10 MEQ/100ML IV SOLN
10.0000 meq | INTRAVENOUS | Status: AC
  Administered 2020-10-23 (×2): 10 meq via INTRAVENOUS
  Filled 2020-10-23: qty 100

## 2020-10-23 MED ORDER — LORAZEPAM 2 MG/ML IJ SOLN: 2.0000 mg | INTRAMUSCULAR | Status: DC | PRN

## 2020-10-23 MED ORDER — LORAZEPAM 2 MG/ML IJ SOLN
2.0000 mg | INTRAMUSCULAR | Status: AC
  Administered 2020-10-23: 2 mg via INTRAVENOUS

## 2020-10-23 MED ORDER — LORAZEPAM 2 MG/ML IJ SOLN
INTRAMUSCULAR | Status: AC
  Filled 2020-10-23: qty 1

## 2020-10-23 MED ORDER — LEVETIRACETAM IN NACL 1500 MG/100ML IV SOLN
1500.0000 mg | Freq: Two times a day (BID) | INTRAVENOUS | Status: DC
  Administered 2020-10-23 – 2020-10-25 (×5): 1500 mg via INTRAVENOUS
  Filled 2020-10-23 (×6): qty 100

## 2020-10-23 NOTE — Plan of Care (Signed)

## 2020-10-23 NOTE — Progress Notes (Addendum)
PROGRESS NOTE    Donna Short  RSW:[id number] DOB: Aug 14, 1991 DOA: 10/22/2020 PCP: Orland Mustard, MD   Brief Narrative: 30 year old with past medical history significant for asthma, depression, hypertension, previous press syndrome, pancreatitis and seizure, currently on medication for seizure who presents to the ED complaining of headache and altered mental status.  Patient has a prior history of alcohol use.  Patient reportedly fell to the ground and hit her head about 4 days prior to admission.  She started to have headache, went to see her PCP and was noted to be confused and referred to the ED for further evaluation.  Patient was noted to be confused.  Neurologist was consulted who recommended loading dose of Keppra and  Transfer to [facility].  MRI of the brain showed area of cortical and subcortical hyperintensity.  There is question of press syndrome versus chronic encephalomalacia.   Assessment & Plan:   Principal Problem:   Seizure (HCC) Active Problems:   Benign essential HTN   ETOH abuse   Acute metabolic encephalopathy   PRES (posterior reversible encephalopathy syndrome)  1-Non Convulsive status  Epilepticus; -Presented with altered mental status.  On my evaluation this morning patient was confused, repeating herself over and over, not following commands.  I contacted neurology immediately. -received IV ativan .  -Keppra IV.  -IV fosphenytoin.   2-History of press syndrome; Her blood pressure has not been significantly  elevated.  3-Hypertension: Continue with blood pressure medication. 4-Hyponatremia;Mild ;  Change IV fluids to NS. IV bolus, repeat labs this afternoon.  5-Hypokalemia: Corrected IV  Estimated body mass index is 20.81 kg/m as calculated from the following:   Height as of this encounter: 5\' 2"  (1.575 m).   Weight as of this encounter: 51.6 kg.   DVT prophylaxis: Lovenox Code Status: Full code Family Communication: Mother 3/01. Disposition  Plan:  Status is: Inpatient  Remains inpatient appropriate because:IV treatments appropriate due to intensity of illness or inability to take PO   Dispo: The patient is from: Home              Anticipated d/c is to: Home              Patient currently is not medically stable to d/c.   Difficult to place patient No        Consultants:   Neurology  Procedures:   EEG  Antimicrobials:    Subjective: Patient was alert on my evaluation but she was not able to answer questions appropriately, she was repeating herself over and over, at one point she turn her head to the left and was not following commands..  Objective: Vitals:   10/22/20 2120 10/22/20 2208 10/22/20 2305 10/23/20 0310  BP: 111/83 (!) 120/91 (!) 115/94 110/75  Pulse: (!) 103 97 88 85  Resp: 18 18 18 18   Temp:  99.1 F (37.3 C) 98.4 F (36.9 C) 98.2 F (36.8 C)  TempSrc:  Oral Oral Oral  SpO2: 99% 100% 100% 100%  Weight:  51.6 kg    Height:  5\' 2"  (1.575 m)      Intake/Output Summary (Last 24 hours) at 10/23/2020 0812 Last data filed at 10/23/2020 0500 Gross per 24 hour  Intake 558.6 ml  Output --  Net 558.6 ml   Filed Weights   10/22/20 2208  Weight: 51.6 kg    Examination:  General exam: Appears calm and comfortable  Respiratory system: Clear to auscultation. Respiratory effort normal. Cardiovascular system: S1 & S2 heard,  RRR. No JVD, murmurs, rubs, gallops or clicks. No pedal edema. Gastrointestinal system: Abdomen is nondistended, soft and nontender. No organomegaly or masses felt. Normal bowel sounds heard. Central nervous system: Alert confused Extremities: Symmetric 5 x 5 power.    Data Reviewed: I have personally reviewed following labs and imaging studies  CBC: Recent Labs  Lab 10/22/20 1414 10/23/20 0402  WBC 7.5 5.7  NEUTROABS 4.5  --   HGB 12.8 10.7*  HCT 36.1 29.8*  MCV 97.3 96.1  PLT 156 142*   Basic Metabolic Panel: Recent Labs  Lab 10/22/20 1414 10/23/20 0402   NA 133* 132*  K 3.6 3.2*  CL 98 100  CO2 24 19*  GLUCOSE 99 82  BUN 17 14  CREATININE 0.75 0.86  CALCIUM 10.2 9.2   GFR: Estimated Creatinine Clearance: 75 mL/min (by C-G formula based on SCr of 0.86 mg/dL). Liver Function Tests: Recent Labs  Lab 10/22/20 1414 10/23/20 0402  AST 26 27  ALT 21 18  ALKPHOS 49 35*  BILITOT 0.5 0.6  PROT 8.8* 6.5  ALBUMIN 4.4 3.3*   Recent Labs  Lab 10/22/20 1414  LIPASE 75*   Recent Labs  Lab 10/22/20 1414  AMMONIA 24   Coagulation Profile: No results for input(s): INR, PROTIME in the last 168 hours. Cardiac Enzymes: No results for input(s): CKTOTAL, CKMB, CKMBINDEX, TROPONINI in the last 168 hours. BNP (last 3 results) No results for input(s): PROBNP in the last 8760 hours. HbA1C: No results for input(s): HGBA1C in the last 72 hours. CBG: Recent Labs  Lab 10/22/20 1400  GLUCAP 78   Lipid Profile: No results for input(s): CHOL, HDL, LDLCALC, TRIG, CHOLHDL, LDLDIRECT in the last 72 hours. Thyroid Function Tests: No results for input(s): TSH, T4TOTAL, FREET4, T3FREE, THYROIDAB in the last 72 hours. Anemia Panel: No results for input(s): VITAMINB12, FOLATE, FERRITIN, TIBC, IRON, RETICCTPCT in the last 72 hours. Sepsis Labs: No results for input(s): PROCALCITON, LATICACIDVEN in the last 168 hours.  Recent Results (from the past 240 hour(s))  SARS CORONAVIRUS 2 (TAT 6-24 HRS) Nasopharyngeal Nasopharyngeal Swab     Status: None   Collection Time: 10/22/20  8:23 PM   Specimen: Nasopharyngeal Swab  Result Value Ref Range Status   SARS Coronavirus 2 NEGATIVE NEGATIVE Final    Comment: (NOTE) SARS-CoV-2 target nucleic acids are NOT DETECTED.  The SARS-CoV-2 RNA is generally detectable in upper and lower respiratory specimens during the acute phase of infection. Negative results do not preclude SARS-CoV-2 infection, do not rule out co-infections with other pathogens, and should not be used as the sole basis for treatment or  other patient management decisions. Negative results must be combined with clinical observations, patient history, and epidemiological information. The expected result is Negative.  Fact Sheet for Patients: HairSlick.no  Fact Sheet for Healthcare Providers: quierodirigir.com  This test is not yet approved or cleared by the [place] FDA and  has been authorized for detection and/or diagnosis of SARS-CoV-2 by FDA under an Emergency Use Authorization (EUA). This EUA will remain  in effect (meaning this test can be used) for the duration of the COVID-19 declaration under Se ction 564(b)(1) of the Act, 21 U.S.C. section 360bbb-3(b)(1), unless the authorization is terminated or revoked sooner.  Performed at [facility], [address], [place], [place] [address]          Radiology Studies: CT Head Wo Contrast  Result Date: 10/22/2020 CLINICAL DATA:  Fall, head injury EXAM: CT HEAD WITHOUT  CONTRAST CT CERVICAL SPINE WITHOUT CONTRAST TECHNIQUE: Multidetector CT imaging of the head and cervical spine was performed following the standard protocol without intravenous contrast. Multiplanar CT image reconstructions of the cervical spine were also generated. COMPARISON:  CT head 05/03/2015.  MRI head 07/26/2020 FINDINGS: CT HEAD FINDINGS Brain: No evidence of acute infarction, hemorrhage, hydrocephalus, extra-axial collection or mass lesion/mass effect. Vascular: Negative for hyperdense vessel Skull: Negative Sinuses/Orbits: Paranasal sinuses clear.  Negative orbit Other: None CT CERVICAL SPINE FINDINGS Alignment: Normal alignment Skull base and vertebrae: Negative for fracture Soft tissues and spinal canal: Negative Disc levels: Normal disc spaces. No significant degenerative change. Upper chest: Lung apices clear bilaterally. Other: None IMPRESSION: Negative CT head Negative CT cervical spine Electronically Signed   By: Marlan Palau M.D.   On: 10/22/2020 15:22   CT Cervical Spine Wo Contrast  Result Date: 10/22/2020 CLINICAL DATA:  Fall, head injury EXAM: CT HEAD WITHOUT CONTRAST CT CERVICAL SPINE WITHOUT CONTRAST TECHNIQUE: Multidetector CT imaging of the head and cervical spine was performed following the standard protocol without intravenous contrast. Multiplanar CT image reconstructions of the cervical spine were also generated. COMPARISON:  CT head 05/03/2015.  MRI head 07/26/2020 FINDINGS: CT HEAD FINDINGS Brain: No evidence of acute infarction, hemorrhage, hydrocephalus, extra-axial collection or mass lesion/mass effect. Vascular: Negative for hyperdense vessel Skull: Negative Sinuses/Orbits: Paranasal sinuses clear.  Negative orbit Other: None CT CERVICAL SPINE FINDINGS Alignment: Normal alignment Skull base and vertebrae: Negative for fracture Soft tissues and spinal canal: Negative Disc levels: Normal disc spaces. No significant degenerative change. Upper chest: Lung apices clear bilaterally. Other: None IMPRESSION: Negative CT head Negative CT cervical spine Electronically Signed   By: Marlan Palau M.D.   On: 10/22/2020 15:22   MR BRAIN WO CONTRAST  Result Date: 10/22/2020 CLINICAL DATA:  Head trauma, abnormal mental status.  Possible PRES. EXAM: MRI HEAD WITHOUT CONTRAST TECHNIQUE: Multiplanar, multiecho pulse sequences of the brain and surrounding structures were obtained without intravenous contrast. COMPARISON:  CT head/cervical spine performed earlier today 10/22/2020. Prior brain MRI examinations 07/26/2020 and earlier. FINDINGS: Brain: Mild cerebral and cerebellar atrophy which is advanced for age. Small regions of cortical/subcortical T2/FLAIR hyperintense signal abnormality within the left parietal and occipital lobes (series 7, images 23-26) (series 7, image 16). These findings were not definitively present on the prior brain MRI of 07/26/2020. Foci of chronic microhemorrhage within the posterior right  frontal lobe and right parietal lobe (2 foci total), unchanged from the brain MRI of 07/26/2020. There is no acute infarct. No evidence of intracranial mass. No chronic intracranial blood products. No extra-axial fluid collection. No midline shift. Vascular: Expected proximal arterial flow voids. Skull and upper cervical spine: No focal marrow lesion. Sinuses/Orbits: Visualized orbits show no acute finding. Trace bilateral ethmoid sinus mucosal thickening. IMPRESSION: Small regions of cortical/subcortical T2/FLAIR hyperintensity within the left parietal and occipital lobes. These findings were not definitively present on the prior MRI of 07/26/2020. Primary differential considerations in this patient include early signal changes related to hypertensive encephalopathy/PRES versus subtle chronic encephalomalacia from prior episodes (seen to better advantage on today's exam). Stable chronic microhemorrhages within the right frontal and parietal lobes (2 total). Mild cerebral and cerebellar atrophy, advanced for age. Electronically Signed   By: Jackey Loge DO   On: 10/22/2020 19:15        Scheduled Meds: . amLODipine  5 mg Oral Daily  . enoxaparin (LOVENOX) injection  40 mg Subcutaneous Q24H  . folic acid  1 mg  Oral Daily  . levETIRAcetam  1,000 mg Oral BID  . metoprolol tartrate  25 mg Oral BID  . sertraline  100 mg Oral Daily   Continuous Infusions: . sodium chloride 75 mL/hr at 10/22/20 2247  . potassium chloride       LOS: 1 day    Time spent: 35 minutes.     Alba CoryBelkys A Shogo Larkey, MD [facility]   If 7PM-7AM, please contact night-coverage [email or web address]  10/23/2020, 8:12 AM

## 2020-10-23 NOTE — Plan of Care (Signed)

## 2020-10-23 NOTE — Progress Notes (Signed)
CSW notified by patient's mother that she will need a note for work to start her short term disability through her job. CSW provided brief letter that she is at the hospital with seizures.   Blenda Nicely, [place] Clinical Social Worker [phone]

## 2020-10-23 NOTE — Progress Notes (Signed)
STAT LTM started; no initial skin breakdown was seen; Dr Melynda Ripple in the room; [facility] notified and event button tested.

## 2020-10-23 NOTE — Consult Note (Addendum)
Neurology Consultation Reason for Consult: Altered mental status, seizure Referring Physician: Dr. Hartley Barefoot  CC: Altered mental status, seizure  History is obtained from: Patient, patient's mother, chart review  HPI: Donna Short is a 30 y.o. female with past medical history of epilepsy on Keppra and possibly phenobarbital who presented with headache and altered mental status.  Patient is confused and unable to provide detailed history but does report having mild right-sided headache as well as difficulty seeing on the right side.  She denies missing any doses of her medications, denies taking any new medications, denies recent change in sleep or alcohol use.  Per chart review, patient went to see her PCP yesterday and was referred to ED because of her confusion.  In the ED, she reported headache and blurred vision.  CT head was done which did not show any acute abnormality.  MRI brain without contrast was also done which showed cortical/subcortical T2 and flair hyperintensity in the left parietal-occipital region concerning for PRES. continue to be confused this morning therefore neurology was consulted.  Patient's mother walked in while I was examining the patient.  She reports patient lives on her own and patient mother is not sure if patient has been taking her medication.  She also reports the patient has a shift job at [employer] and therefore does not consistently get regular sleep.  Of note patient sees Dr. Terrace Arabia at [facility] neurology [facility], last visit on 07/18/2020.  Per the note, plan was to wean off Dilantin followed by phenobarbital.  ROS: All other systems reviewed and negative except as noted in the HPI.   Past Medical History:  Diagnosis Date  . Asthma   . Depression   . History of chicken pox   . Hypertension   . Migraines   . Pancreatitis 01/2018  . Seizures (HCC)     Family History  Problem Relation Age of Onset  . Diabetes Father   . Arthritis  Father   . Alcohol abuse Father   . Breast cancer Mother   . Asthma Mother   . Drug abuse Mother   . Hypertension Mother     Social History:  reports that she quit smoking about 2 years ago. Her smoking use included cigarettes. She has never used smokeless tobacco. She reports current alcohol use of about 9.0 standard drinks of alcohol per week. She reports that she does not use drugs.   Exam: Current vital signs: BP 110/75 (BP Location: Right Arm)   Pulse 85   Temp 98.2 F (36.8 C) (Oral)   Resp 18   Ht 5\' 2"  (1.575 m)   Wt 51.6 kg   SpO2 100%   BMI 20.81 kg/m  Vital signs in last 24 hours: Temp:  [97.6 F (36.4 C)-99.1 F (37.3 C)] 98.2 F (36.8 C) (03/01 0310) Pulse Rate:  [85-125] 85 (03/01 0310) Resp:  [14-24] 18 (03/01 0310) BP: (100-128)/(69-106) 110/75 (03/01 0310) SpO2:  [90 %-100 %] 100 % (03/01 0310) Weight:  [50.6 kg-51.6 kg] 51.6 kg (02/28 2208)   Physical Exam  Constitutional: Appears well-developed and well-nourished.  Psych: Unable to assess due to encephalopathy Eyes: No scleral injection HENT: No OP obstrucion Head: Normocephalic.  Cardiovascular: Normal rate and regular rhythm.  Respiratory: Effort normal, non-labored breathing GI: Soft.  No distension. There is no tenderness.  Skin: Warm  Neuro: Awake, alert, oriented to self, could not tell me place and time, able to name objects like pen and thumb but then perseverates  on pain, PERRLA, right visual field neglect versus hemianopia, rest of the cranial nerves appear grossly intact, 4/5 in all 4 extremities with finger-to-nose intact   I have reviewed labs in epic and the results pertinent to this consultation are: normal ammonia, negative alcohol, lipase 75, UDS positive for barbiturates  Sodium 133--> 132 Albumin 3.3  I have reviewed the images obtained:  MR Brain wo contrast 2/28/2022Small regions of cortical/subcortical T2/FLAIR hyperintensity within the left parietal and occipital  lobes. These findings were not definitively present on the prior MRI of 07/26/2020. Primary differential considerations in this patient include early signal changes related to hypertensive encephalopathy/PRES versus subtle chronic encephalomalacia from prior episodes (seen to better advantage on today's exam). Stable chronic microhemorrhages within the right frontal and parietal lobes (2 total). Mild cerebral and cerebellar atrophy, advanced for age.    ASSESSMENT/PLAN: 30 year old female with history of epilepsy presented with altered mental status and found to be in nonconvulsive status epilepticus.  Nonconvulsive status epilepticus Acute encephalopathy, likely ictal Hyponatremia Suspected pancreatitis - Stat EEG was ordered which showed seizures arising from left posterior quadrant -Etiology for status: Medication noncompliance versus infection versus pancreatitis  Recommendations: -We will give IV Ativan 2 mg stat -Patient has yet not received his p.o. Keppra.  We will switch to IV Keppra and increase to 1500 mg twice daily -We will also loaded with IV fosphenytoin and check post load level in 2 hours -Per Dr. Zannie Cove note, patient should be on phenobarbital.  However, patient's UDS was positive for barbiturates.  Therefore we will check phenobarbital level and resume if patient was taking that at home -We will also check urinalysis to look for source of infection  -Patient is afebrile and no leukocytosis, therefore low suspicion for bacteremia, meningitis/encephalitis -Seizure precautions As needed IV Ativan 2 mg for clinical seizure lasting more than 2 minutes -Discussed my plan with patient, her mother at bedside, Dr. Sunnie Nielsen and patient's nurse   ADDENDUM - Phenobarb level 1608, unclear exact dose patient was taking. For now wil start 130mg  daily. Can be titrated for seizure control - Also schedule dilantin 100mg  q8h - If any further sz, can also load with Vimpat   CRITICAL  CARE Performed by:   Total critical care time: 50 minutes  Critical care time was exclusive of separately billable procedures and treating other patients.  Critical care was necessary to treat or prevent imminent or life-threatening deterioration.  Critical care was time spent personally by me on the following activities: development of treatment plan with patient and/or surrogate as well as nursing, discussions with consultants, evaluation of patient's response to treatment, examination of patient, obtaining history from patient or surrogate, ordering and performing treatments and interventions, ordering and review of laboratory studies, ordering and review of radiographic studies, pulse oximetry and re-evaluation of patient's condition.   Epilepsy [facility]

## 2020-10-23 NOTE — Progress Notes (Signed)
Pt w/ expressive aphsia AOx1, therefore admission doc not complete at this time.

## 2020-10-24 DIAGNOSIS — I10 Essential (primary) hypertension: Secondary | ICD-10-CM

## 2020-10-24 LAB — GLUCOSE, CAPILLARY
Glucose-Capillary: 154 mg/dL — ABNORMAL HIGH (ref 70–99)
Glucose-Capillary: 114 mg/dL — ABNORMAL HIGH (ref 70–99)

## 2020-10-24 MED ORDER — PERAMPANEL 2 MG PO TABS
16.0000 mg | ORAL_TABLET | Freq: Once | ORAL | Status: AC
  Administered 2020-10-24: 16 mg via ORAL
  Filled 2020-10-24: qty 8

## 2020-10-24 NOTE — Progress Notes (Addendum)
Subjective: Patient's mother at bedside.  Patient states she still feels confused, became tearful.  ROS: Unable to obtain due to poor mental status  Examination  Vital signs in last 24 hours: Temp:  [98.5 F (36.9 C)-99.1 F (37.3 C)] 99 F (37.2 C) (03/02 1223) Pulse Rate:  [86-96] 89 (03/02 1223) Resp:  [14-20] 14 (03/02 1223) BP: (95-118)/(70-91) 108/88 (03/02 1223) SpO2:  [99 %-100 %] 100 % (03/02 1223)  General: lying in bed, not in apparent distress CVS: pulse-normal rate and rhythm RS: breathing comfortably Extremities: normal   Neuro: Awake, alert, able to tell me her name and that she is at [facility] in [place], but also perseverating on her date of birth, able to name 2 objects but continues to go back and perseverate on the date, intermittently follows simple one-step commands, cranial nerves appear grossly intact, unable to assess visual fields today as patient became tearful, spontaneously moving all 4 extremities  Basic Metabolic Panel: Recent Labs  Lab 10/22/20 1414 10/23/20 0402 10/23/20 1531  NA 133* 132* 136  K 3.6 3.2* 4.0  CL 98 100 108  CO2 24 19* 18*  GLUCOSE 99 82 193*  BUN 17 14 17   CREATININE 0.75 0.86 1.15*  CALCIUM 10.2 9.2 8.6*    CBC: Recent Labs  Lab 10/22/20 1414 10/23/20 0402  WBC 7.5 5.7  NEUTROABS 4.5  --   HGB 12.8 10.7*  HCT 36.1 29.8*  MCV 97.3 96.1  PLT 156 142*     Coagulation Studies: No results for input(s): LABPROT, INR in the last 72 hours.  Imaging No new brain imaging overnight  ASSESSMENT AND PLAN: 30 year old female with history of epilepsy presented with altered mental status and found to be in nonconvulsive status epilepticus.  Nonconvulsive status epilepticus, resolved Acute encephalopathy, likely ictal, improving - Stat EEG was ordered which showed seizures arising from left posterior quadrant -Etiology for status: Medication noncompliance versus infection versus  pancreatitis  Recommendations: -Loaded with perampanel 16 mg once -Continue Keppra 1500 mg twice daily, Dilantin 100 mg every 8 hours, phenobarbital 30 mg daily -If seizures persist, will consider adding Vimpat -As patient is otherwise awake and alert, would like to avoid intubation and burst suppression -Continue LTM EEG will adjust medications -Seizure precautions As needed IV Ativan 2 mg for clinical seizure lasting more than 2 minutes -Discussed my plan with patient, her mother at bedside, Dr. 38 and patient's nurse  I have spent a total of 35 minutes with the patient reviewing hospital notes,  test results, labs and examining the patient as well as establishing an assessment and plan that was discussed personally with the patient.  > 50% of time was spent in direct patient care.   Darnelle Catalan Epilepsy [facility] For questions after 5pm please refer to AMION to reach the Neurologist on call

## 2020-10-24 NOTE — Progress Notes (Signed)
EEG maintenance complete. Reapplied leads wrapped head. No skin breakdown noted

## 2020-10-24 NOTE — Progress Notes (Signed)
Progress Note    Donna Short  OFH:[id number] DOB: 1991/05/20  DOA: 10/22/2020 PCP: Orland Mustard, MD    Brief Narrative:   Chief complaint: F/U Seizure  Medical records reviewed and are as summarized below:  Donna Short is an 30 y.o. female with past medical history significant for asthma, depression, hypertension, previous press syndrome, pancreatitis and seizure, currently on medication for seizure who presents to the ED complaining of headache and altered mental status.  Patient has a prior history of alcohol use.  Patient reportedly fell to the ground and hit her head about 4 days prior to admission.  She started to have headache, went to see her PCP and was noted to be confused and referred to the ED for further evaluation. Neurologist was consulted who recommended loading dose of Keppra and  Transfer to [facility].  MRI of the brain showed area of cortical and subcortical hyperintensity.  There is question of press syndrome versus chronic encephalomalacia.  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  Neurology consulted.  Stat EEG ordered which showed seizures arising from the left posterior quadrant.  Given 2 mg of IV Ativan.  Keppra dose increased to 1500 mg twice daily and loaded with IV fosphenytoin.  Phenobarbital levels checked and found to be 1608 with phenobarbital dosing resumed at 130 mg daily and scheduled Dilantin 100 mg every 8 hours.  Continue seizure precautions and as needed IV Ativan for breakthrough seizures.  If further seizures noted, neurology recommends the addition of Vimpat.   Active problems:  History of press syndrome; Her blood pressure has not been significantly  elevated.  Hypertension Continue with blood pressure  medication.  Hyponatremia Corrected with IV fluids.  Hypokalemia:  Corrected IV.   Nutritional status  Body mass index is 20.81 kg/m.   Family Communication/Anticipated D/C date and plan/Code Status   DVT prophylaxis: enoxaparin (LOVENOX) injection 40 mg Start: 10/22/20 2300   Current Level of Care: Progressive Code Status: Full Code.  Family Communication: Mother at the bedside. Disposition Plan: Status is: Inpatient  Remains inpatient appropriate because:Ongoing seizure activity.   Dispo: The patient is from: Home              Anticipated d/c is to: Home              Patient currently is not medically stable to d/c.   Difficult to place patient: No   Medical Consultants:    Neurology   Anti-Infectives:    None  Subjective:   Was tearful earlier today, but calm by the time I came to speak with her.  Wanted to go home, feels uncomfortable due to EEG leads on head. Appetite OK.  Intermittently confused, per mother who is sitting with her at the bedside. Denies headache, other symptoms. Appetite good.  Objective:    Vitals:   10/23/20 2000 10/23/20 2303 10/24/20 0350 10/24/20 0811  BP: 95/70 104/78 (!) 112/91 (!) 118/91  Pulse: 93 96 86 94  Resp: 18 18 18 16   Temp: 98.5 F (36.9 C) 98.7 F (37.1 C) 99.1 F (37.3 C)   TempSrc: Oral Oral Oral Oral  SpO2: 100% 99% 100% 100%  Weight:      Height:        Intake/Output Summary (Last 24 hours) at 10/24/2020 12/24/2020 Last data filed  at 10/24/2020 9163 Gross per 24 hour  Intake 2670.56 ml  Output 625 ml  Net 2045.56 ml   Filed Weights   10/22/20 2208  Weight: 51.6 kg    Exam: General: Appears younger than stated age, sitting up in bed eating lunch.  EEG electrodes on scalp. Cardiovascular: Heart sounds show a regular rate, and rhythm. No gallops or rubs. No murmurs. No JVD. Lungs: Clear to auscultation bilaterally with good air movement. No rales, rhonchi or wheezes. Abdomen: Soft, nontender, nondistended  with normal active bowel sounds. No masses. No hepatosplenomegaly. Neurological: Alert and oriented 2. Moves all extremities 4 with equal strength. Cranial nerves II through XII grossly intact. Slow to respond at times. Skin: Warm and dry. No rashes or lesions. Extremities: No clubbing or cyanosis. No edema. Pedal pulses 2+. Psychiatric: Mood and affect are flat. Insight and judgment are impaired.      Data Reviewed:   I have personally reviewed following labs and imaging studies:  Labs: Labs show the following:   Basic Metabolic Panel: Recent Labs  Lab 10/22/20 1414 10/23/20 0402 10/23/20 1531  NA 133* 132* 136  K 3.6 3.2* 4.0  CL 98 100 108  CO2 24 19* 18*  GLUCOSE 99 82 193*  BUN 17 14 17   CREATININE 0.75 0.86 1.15*  CALCIUM 10.2 9.2 8.6*   GFR Estimated Creatinine Clearance: 56.1 mL/min (A) (by C-G formula based on SCr of 1.15 mg/dL (H)). Liver Function Tests: Recent Labs  Lab 10/22/20 1414 10/23/20 0402  AST 26 27  ALT 21 18  ALKPHOS 49 35*  BILITOT 0.5 0.6  PROT 8.8* 6.5  ALBUMIN 4.4 3.3*   Recent Labs  Lab 10/22/20 1414 10/23/20 0402  LIPASE 75* 64*   Recent Labs  Lab 10/22/20 1414  AMMONIA 24   CBC: Recent Labs  Lab 10/22/20 1414 10/23/20 0402  WBC 7.5 5.7  NEUTROABS 4.5  --   HGB 12.8 10.7*  HCT 36.1 29.8*  MCV 97.3 96.1  PLT 156 142*    Microbiology Recent Results (from the past 240 hour(s))  SARS CORONAVIRUS 2 (TAT 6-24 HRS) Nasopharyngeal Nasopharyngeal Swab     Status: None   Collection Time: 10/22/20  8:23 PM   Specimen: Nasopharyngeal Swab  Result Value Ref Range Status   SARS Coronavirus 2 NEGATIVE NEGATIVE Final    Comment: (NOTE) SARS-CoV-2 target nucleic acids are NOT DETECTED.  The SARS-CoV-2 RNA is generally detectable in upper and lower respiratory specimens during the acute phase of infection. Negative results do not preclude SARS-CoV-2 infection, do not rule out co-infections with other pathogens, and should  not be used as the sole basis for treatment or other patient management decisions. Negative results must be combined with clinical observations, patient history, and epidemiological information. The expected result is Negative.  Fact Sheet for Patients: 10/24/20  Fact Sheet for Healthcare Providers: HairSlick.no  This test is not yet approved or cleared by the quierodirigir.com FDA and  has been authorized for detection and/or diagnosis of SARS-CoV-2 by FDA under an Emergency Use Authorization (EUA). This EUA will remain  in effect (meaning this test can be used) for the duration of the COVID-19 declaration under Se ction 564(b)(1) of the Act, 21 U.S.C. section 360bbb-3(b)(1), unless the authorization is terminated or revoked sooner.  Performed at [facility], [address] [place], [place]     Procedures and diagnostic studies:  CT Head Wo Contrast  Result Date: 10/22/2020 CLINICAL DATA:  Fall,  head injury EXAM: CT HEAD WITHOUT CONTRAST CT CERVICAL SPINE WITHOUT CONTRAST TECHNIQUE: Multidetector CT imaging of the head and cervical spine was performed following the standard protocol without intravenous contrast. Multiplanar CT image reconstructions of the cervical spine were also generated. COMPARISON:  CT head 05/03/2015.  MRI head 07/26/2020 FINDINGS: CT HEAD FINDINGS Brain: No evidence of acute infarction, hemorrhage, hydrocephalus, extra-axial collection or mass lesion/mass effect. Vascular: Negative for hyperdense vessel Skull: Negative Sinuses/Orbits: Paranasal sinuses clear.  Negative orbit Other: None CT CERVICAL SPINE FINDINGS Alignment: Normal alignment Skull base and vertebrae: Negative for fracture Soft tissues and spinal canal: Negative Disc levels: Normal disc spaces. No significant degenerative change. Upper chest: Lung apices clear bilaterally. Other: None IMPRESSION: Negative CT head Negative CT  cervical spine Electronically Signed   By: Marlan Palau M.D.   On: 10/22/2020 15:22   CT Cervical Spine Wo Contrast  Result Date: 10/22/2020 CLINICAL DATA:  Fall, head injury EXAM: CT HEAD WITHOUT CONTRAST CT CERVICAL SPINE WITHOUT CONTRAST TECHNIQUE: Multidetector CT imaging of the head and cervical spine was performed following the standard protocol without intravenous contrast. Multiplanar CT image reconstructions of the cervical spine were also generated. COMPARISON:  CT head 05/03/2015.  MRI head 07/26/2020 FINDINGS: CT HEAD FINDINGS Brain: No evidence of acute infarction, hemorrhage, hydrocephalus, extra-axial collection or mass lesion/mass effect. Vascular: Negative for hyperdense vessel Skull: Negative Sinuses/Orbits: Paranasal sinuses clear.  Negative orbit Other: None CT CERVICAL SPINE FINDINGS Alignment: Normal alignment Skull base and vertebrae: Negative for fracture Soft tissues and spinal canal: Negative Disc levels: Normal disc spaces. No significant degenerative change. Upper chest: Lung apices clear bilaterally. Other: None IMPRESSION: Negative CT head Negative CT cervical spine Electronically Signed   By: Marlan Palau M.D.   On: 10/22/2020 15:22   MR BRAIN WO CONTRAST  Result Date: 10/22/2020 CLINICAL DATA:  Head trauma, abnormal mental status.  Possible PRES. EXAM: MRI HEAD WITHOUT CONTRAST TECHNIQUE: Multiplanar, multiecho pulse sequences of the brain and surrounding structures were obtained without intravenous contrast. COMPARISON:  CT head/cervical spine performed earlier today 10/22/2020. Prior brain MRI examinations 07/26/2020 and earlier. FINDINGS: Brain: Mild cerebral and cerebellar atrophy which is advanced for age. Small regions of cortical/subcortical T2/FLAIR hyperintense signal abnormality within the left parietal and occipital lobes (series 7, images 23-26) (series 7, image 16). These findings were not definitively present on the prior brain MRI of 07/26/2020. Foci of  chronic microhemorrhage within the posterior right frontal lobe and right parietal lobe (2 foci total), unchanged from the brain MRI of 07/26/2020. There is no acute infarct. No evidence of intracranial mass. No chronic intracranial blood products. No extra-axial fluid collection. No midline shift. Vascular: Expected proximal arterial flow voids. Skull and upper cervical spine: No focal marrow lesion. Sinuses/Orbits: Visualized orbits show no acute finding. Trace bilateral ethmoid sinus mucosal thickening. IMPRESSION: Small regions of cortical/subcortical T2/FLAIR hyperintensity within the left parietal and occipital lobes. These findings were not definitively present on the prior MRI of 07/26/2020. Primary differential considerations in this patient include early signal changes related to hypertensive encephalopathy/PRES versus subtle chronic encephalomalacia from prior episodes (seen to better advantage on today's exam). Stable chronic microhemorrhages within the right frontal and parietal lobes (2 total). Mild cerebral and cerebellar atrophy, advanced for age. Electronically Signed   By: Jackey Loge DO   On: 10/22/2020 19:15    Medications:   . amLODipine  5 mg Oral Daily  . enoxaparin (LOVENOX) injection  40 mg Subcutaneous Q24H  . folic acid  1 mg Oral Daily  . metoprolol tartrate  25 mg Oral BID  . PHENObarbital  130 mg Intravenous Daily  . phenytoin (DILANTIN) IV  100 mg Intravenous Q8H  . sertraline  100 mg Oral Daily   Continuous Infusions: . sodium chloride Stopped (10/23/20 1701)  . levETIRAcetam Stopped (10/24/20 16100611)     LOS: 2 days   Hillery Aldohristina Rama, MD  [facility]   [facility] How to contact the [facility] Attending or Consulting provider 7A - 7P or covering provider during after hours 7P -7A, for this patient?  1. Check the care team in [facility] and look for a) attending/consulting [facility] provider listed and b) the [facility] team listed 2. Log into [email or web address] and use [facility]'s universal password to access. If you do not have the password, please contact the hospital operator. 3. Locate the [facility] provider you are looking for under [facility] and page to a number that you can be directly reached. 4. If you still have difficulty reaching the provider, please page the [facility] (Director on Call) for the Hospitalists listed on amion for assistance.  10/24/2020, 8:36 AM

## 2020-10-24 NOTE — Progress Notes (Signed)
LTM EEG maint complete.  °

## 2020-10-24 NOTE — Procedures (Addendum)
Patient Name: Donna Short  MRN: [id number]  Epilepsy Attending: [name]  Referring Physician/Provider: Dr. Lindie Spruce Duration: 10/23/2020 6270 to 10/24/2020 3500  Patient history: 30 year old female with history of epilepsy presented with altered mental status.  EEG to evaluate for seizures.  Level of alertness: Awake, asleep  AEDs during EEG study: LEV, PHT, Phenobarb  Technical aspects: This EEG study was done with scalp electrodes positioned according to the 10-20 International system of electrode placement. Electrical activity was acquired at a sampling rate of 500Hz  and reviewed with a high frequency filter of 70Hz  and a low frequency filter of 1Hz . EEG data were recorded continuously and digitally stored.   Description: The posterior dominant rhythm consists of 9-10 Hz activity of moderate voltage (25-35 uV) seen predominantly in posterior head regions, symmetric and reactive to eye opening and eye closing. Sleep was characterized by vertex waves, sleep spindles (12 to 14 Hz), maximal frontocentral region.  Lateralized periodic discharges with overlying fast activity were noted in left posterior quadrant at 1 Hz.  At the beginning of the study, patient was noted to have frequent ( avg 2-3/hour) seizures. During the seizures patient was confused and not consistently answering questions appropriately while interacting with someone without any other clinical signs.  Concomitant EEG showed sharply contoured 7 to 8 Hz theta activity which gradually evolved in morphology and frequency to 4 to 5 Hz theta and eventually 2 to 3 Hz delta slowing and also spread to involve the right hemisphere.  As AEDs were adjusted, frequency of seizures reduced and patient was noted to have 12 seizures between 1130 on 10/23/2020  to 10/24/2020 0924.  Last seizure was noted on 10/24/2020 at 0858.  Average duration of seizures was about 3 minutes.  ABNORMALITY -Focal seizure, left posterior  quadrant -Lateralized periodic discharges, left posterior quadrant  IMPRESSION: This study initially showed frequent seizures (average 2 to 3/h) which gradually improved after adjusting AEDs. Total 12 seizures were noted between 1130 on 10/23/2020  to 10/24/2020 0924. Patient was noted to be confused during the seizures. Seizures are arising from left posterior quadrant, last seizure on 10/24/2020 at 0858, average duration of seizure about 3 minutes.  EEG also showed evidence of epileptogenicity arising from left posterior quadrant due to underlying structural abnormality.  Marquarius Lofton 12/23/2020

## 2020-10-25 LAB — GLUCOSE, CAPILLARY
Glucose-Capillary: 103 mg/dL — ABNORMAL HIGH (ref 70–99)
Glucose-Capillary: 102 mg/dL — ABNORMAL HIGH (ref 70–99)
Glucose-Capillary: 92 mg/dL (ref 70–99)

## 2020-10-25 LAB — LEVETIRACETAM LEVEL: Levetiracetam Lvl: 16.9 ug/mL (ref 10.0–40.0)

## 2020-10-25 MED ORDER — SODIUM CHLORIDE 0.9 % IV SOLN
400.0000 mg | INTRAVENOUS | Status: AC
  Administered 2020-10-25: 400 mg via INTRAVENOUS
  Filled 2020-10-25: qty 40

## 2020-10-25 MED ORDER — LEVETIRACETAM 750 MG PO TABS
1500.0000 mg | ORAL_TABLET | Freq: Two times a day (BID) | ORAL | Status: DC
  Administered 2020-10-25 – 2020-10-27 (×4): 1500 mg via ORAL
  Filled 2020-10-25 (×4): qty 2

## 2020-10-25 MED ORDER — PHENYTOIN 50 MG PO CHEW
100.0000 mg | CHEWABLE_TABLET | Freq: Three times a day (TID) | ORAL | Status: DC
  Administered 2020-10-25 – 2020-10-27 (×6): 100 mg via ORAL
  Filled 2020-10-25 (×7): qty 2

## 2020-10-25 MED ORDER — PERAMPANEL 2 MG PO TABS
4.0000 mg | ORAL_TABLET | Freq: Every day | ORAL | Status: DC
  Administered 2020-10-25 – 2020-10-27 (×3): 4 mg via ORAL
  Filled 2020-10-25 (×3): qty 2

## 2020-10-25 MED ORDER — LACOSAMIDE 50 MG PO TABS
100.0000 mg | ORAL_TABLET | Freq: Two times a day (BID) | ORAL | Status: DC
  Administered 2020-10-25 – 2020-10-27 (×4): 100 mg via ORAL
  Filled 2020-10-25 (×4): qty 2

## 2020-10-25 MED ORDER — PHENOBARBITAL 32.4 MG PO TABS
97.2000 mg | ORAL_TABLET | Freq: Every day | ORAL | Status: DC
  Administered 2020-10-26 – 2020-10-27 (×2): 97.2 mg via ORAL
  Filled 2020-10-25 (×2): qty 3

## 2020-10-25 NOTE — Progress Notes (Signed)
EEG maint complete. No skin breakdown at Fp1 F7 Cz/ re wrapped head No skin breakdown seen. Continue to monitor

## 2020-10-25 NOTE — Progress Notes (Signed)
Subjective: Patient appears to be more awake and conversational today.  Asked if she can go home today.  And became tearful when told she will have to stay inpatient little longer. Grandfather at bedside.   ROS: negative except above  Examination  Vital signs in last 24 hours: Temp:  [97.5 F (36.4 C)-99 F (37.2 C)] 98.4 F (36.9 C) (03/03 1113) Pulse Rate:  [86-100] 99 (03/03 1113) Resp:  [15-18] 18 (03/03 1113) BP: (110-125)/(82-98) 122/94 (03/03 1113) SpO2:  [100 %] 100 % (03/03 1113)  General: lying in bed, not in apparent distress CVS: pulse-normal rate and rhythm RS: breathing comfortably Extremities: normal  Neuro:  AOx3, cranial nerves appear grossly intact,  5/5 in all 4 extremities, FTN intact  Basic Metabolic Panel: Recent Labs  Lab 10/22/20 1414 10/23/20 0402 10/23/20 1531  NA 133* 132* 136  K 3.6 3.2* 4.0  CL 98 100 108  CO2 24 19* 18*  GLUCOSE 99 82 193*  BUN 17 14 17   CREATININE 0.75 0.86 1.15*  CALCIUM 10.2 9.2 8.6*    CBC: Recent Labs  Lab 10/22/20 1414 10/23/20 0402  WBC 7.5 5.7  NEUTROABS 4.5  --   HGB 12.8 10.7*  HCT 36.1 29.8*  MCV 97.3 96.1  PLT 156 142*     Coagulation Studies: No results for input(s): LABPROT, INR in the last 72 hours.  Imaging No new brain imaging overnight  ASSESSMENT AND PLAN: 30 year old female with history of epilepsy presented with altered mental status and found to be in nonconvulsive status epilepticus.  Nonconvulsive status epilepticus, resolved Acute encephalopathy, likely ictal, improving - Stat EEG was ordered which showed seizures arising from left posterior quadrant -Etiology for status: Medication noncompliance versus pancreatitis  Recommendations: -Load with IV Vimpat 400 mg once -Continue Keppra 1500 mg twice daily, Dilantin 100 mg every 8 hours, phenobarbital 95 mg daily -Scheduled perampanel 4 mg daily and Vimpat 100 mg twice daily -Continue LTM EEG will adjust medications -We will  discuss with social worker check with insurance if patient's insurance will cover Vimpat and perampanel -Seizure precautions As needed IV Ativan 2 mg for clinical seizure lasting more than 2 minutes -Discussed my plan with patient,her mother on phone, grandfather at bedside  I have spent a total of35 minuteswith the patient reviewing hospitalnotes,  test results, labs and examining the patient as well as establishing an assessment and plan that was discussed personally with the patient.>50% of time was spent in direct patient care.   38 Epilepsy [facility] For questions after 5pm please refer to AMION to reach the Neurologist on call

## 2020-10-25 NOTE — Progress Notes (Signed)
Father visited pt this morning and asked RN directly for updates regularly. Separately and privately,  RN asked pt about her wishes. Pt informed RN she does not want her medical information released to her father.

## 2020-10-25 NOTE — Progress Notes (Signed)
Progress Note    Donna Short  NTI:[id number] DOB: 12/26/1990  DOA: 10/22/2020 PCP: Orland Mustard, MD    Brief Narrative:   Chief complaint: F/U Seizure  Medical records reviewed and are as summarized below:  Donna Short is an 30 y.o. female with past medical history significant for asthma, depression, hypertension, previous press syndrome, pancreatitis and seizure, currently on medication for seizure who presents to the ED complaining of headache and altered mental status.  Patient has a prior history of alcohol use.  Patient reportedly fell to the ground and hit her head about 4 days prior to admission.  She started to have headache, went to see her PCP and was noted to be confused and referred to the ED for further evaluation. Neurologist was consulted who recommended loading dose of Keppra and  Transfer to [facility].  MRI of the brain showed area of cortical and subcortical hyperintensity.  There is question of press syndrome versus chronic encephalomalacia.  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  Neurology consulted.  Stat EEG ordered which showed seizures arising from the left posterior quadrant.  Given 2 mg of IV Ativan.  Keppra dose increased to 1500 mg twice daily and loaded with IV fosphenytoin.  Phenobarbital levels checked and found to be 1608 with phenobarbital dosing resumed at 130 mg daily and scheduled Dilantin 100 mg every 8 hours.  Continue seizure precautions and as needed IV Ativan for breakthrough seizures. Had further seizures overnight and neurologist added Vimpat and perampanel.   Active problems:  History of press syndrome; Her blood pressure has not been significantly elevated.  Hypertension Continue with blood pressure  medication.  Hyponatremia Corrected with IV fluids.  Hypokalemia:  Corrected IV.   Nutritional status  Body mass index is 20.81 kg/m.   Family Communication/Anticipated D/C date and plan/Code Status   DVT prophylaxis: enoxaparin (LOVENOX) injection 40 mg Start: 10/22/20 2300   Current Level of Care: Progressive Code Status: Full Code.  Family Communication: Mother at the bedside 10/24/20. Disposition Plan: Status is: Inpatient  Remains inpatient appropriate because:Ongoing seizure activity.   Dispo: The patient is from: Home              Anticipated d/c is to: Home              Patient currently is not medically stable to d/c.   Difficult to place patient: No   Medical Consultants:    Neurology   Anti-Infectives:    None  Subjective:   Calmer today. No complaints. Appetite good. Prefers to have medical issues discussed with her and not parents.   Objective:    Vitals:   10/24/20 1930 10/24/20 2336 10/25/20 0303 10/25/20 0818  BP: (!) 123/96 113/88 (!) 122/94 (!) 125/98  Pulse: 98 100 86 87  Resp: 18 18 18 18   Temp: 98.7 F (37.1 C) 99 F (37.2 C) (!) 97.5 F (36.4 C) 98.1 F (36.7 C)  TempSrc: Oral Oral Oral Oral  SpO2: 100% 100% 100% 100%  Weight:      Height:        Intake/Output Summary (Last 24 hours) at 10/25/2020 0827 Last data filed at 10/24/2020 1758 Gross per 24 hour  Intake 1753.29 ml  Output -  Net 1753.29 ml   12/24/2020   10/22/20 2208  Weight: 51.6 kg    Exam: General: Appears younger than stated age, sitting up in bed eating lunch.  EEG  electrodes on scalp again noted. Cardiovascular: Heart sounds show a regular rate, and rhythm. No gallops or rubs. No murmurs. No JVD. Lungs: Clear to auscultation bilaterally with good air movement. No rales, rhonchi or wheezes. Abdomen: Soft, nontender, nondistended with normal active bowel sounds. No masses. No hepatosplenomegaly. Neurological: Alert and oriented 2. Moves all  extremities 4 with equal strength. Cranial nerves II through XII grossly intact. Slow to respond at times. Skin: Warm and dry. No rashes or lesions. Extremities: No clubbing or cyanosis. No edema. Pedal pulses 2+. Psychiatric: Mood and affect are flat. Insight and judgment are impaired.      Data Reviewed:   I have personally reviewed following labs and imaging studies:  Labs: Labs show the following:   Basic Metabolic Panel: Recent Labs  Lab 10/22/20 1414 10/23/20 0402 10/23/20 1531  NA 133* 132* 136  K 3.6 3.2* 4.0  CL 98 100 108  CO2 24 19* 18*  GLUCOSE 99 82 193*  BUN 17 14 17   CREATININE 0.75 0.86 1.15*  CALCIUM 10.2 9.2 8.6*   GFR Estimated Creatinine Clearance: 56.1 mL/min (A) (by C-G formula based on SCr of 1.15 mg/dL (H)). Liver Function Tests: Recent Labs  Lab 10/22/20 1414 10/23/20 0402  AST 26 27  ALT 21 18  ALKPHOS 49 35*  BILITOT 0.5 0.6  PROT 8.8* 6.5  ALBUMIN 4.4 3.3*   Recent Labs  Lab 10/22/20 1414 10/23/20 0402  LIPASE 75* 64*   Recent Labs  Lab 10/22/20 1414  AMMONIA 24   CBC: Recent Labs  Lab 10/22/20 1414 10/23/20 0402  WBC 7.5 5.7  NEUTROABS 4.5  --   HGB 12.8 10.7*  HCT 36.1 29.8*  MCV 97.3 96.1  PLT 156 142*    Microbiology Recent Results (from the past 240 hour(s))  SARS CORONAVIRUS 2 (TAT 6-24 HRS) Nasopharyngeal Nasopharyngeal Swab     Status: None   Collection Time: 10/22/20  8:23 PM   Specimen: Nasopharyngeal Swab  Result Value Ref Range Status   SARS Coronavirus 2 NEGATIVE NEGATIVE Final    Comment: (NOTE) SARS-CoV-2 target nucleic acids are NOT DETECTED.  The SARS-CoV-2 RNA is generally detectable in upper and lower respiratory specimens during the acute phase of infection. Negative results do not preclude SARS-CoV-2 infection, do not rule out co-infections with other pathogens, and should not be used as the sole basis for treatment or other patient management decisions. Negative results must be  combined with clinical observations, patient history, and epidemiological information. The expected result is Negative.  Fact Sheet for Patients: 10/24/20  Fact Sheet for Healthcare Providers: [employer]no  This test is not yet approved or cleared by the [employer]ir[employer].com FDA and  has been authorized for detection and/or diagnosis of SARS-CoV-2 by FDA under an Emergency Use Authorization (EUA). This EUA will remain  in effect (meaning this test can be used) for the duration of the COVID-19 declaration under Se ction 564(b)(1) of the Act, 21 U.S.C. section 360bbb-3(b)(1), unless the authorization is terminated or revoked sooner.  Performed at [facility], [address], [place], [place]     Procedures and diagnostic studies:  Overnight EEG with video  Result Date: 10/24/2020 12/24/2020, MD     10/24/2020 12:35 PM Patient Name: Donna Short MRN: Romona Curls Epilepsy Attending: [id number] Referring Physician/Provider: Dr. [name] Duration:  10/23/2020 0924 to 10/24/2020 4128 Patient history: 30 year old female with history of epilepsy presented with altered mental status.  EEG to evaluate for seizures. Level of alertness: Awake, asleep AEDs during EEG study: LEV, PHT, Phenobarb Technical aspects: This EEG study was done with scalp electrodes positioned according to the 10-20 International system of electrode placement. Electrical activity was acquired at a sampling rate of 500Hz  and reviewed with a high frequency filter of 70Hz  and a low frequency filter of 1Hz . EEG data were recorded continuously and digitally stored. Description: The posterior dominant rhythm consists of 9-10 Hz activity of moderate voltage (25-35 uV) seen predominantly in posterior head regions, symmetric and reactive to eye opening and eye closing. Sleep was characterized by vertex waves, sleep spindles (12 to 14 Hz), maximal  frontocentral region.  Lateralized periodic discharges with overlying fast activity were noted in left posterior quadrant at 1 Hz. At the beginning of the study, patient was noted to have frequent ( avg 2-3/hour) seizures. During the seizures patient was confused and not consistently answering questions appropriately while interacting with someone without any other clinical signs.  Concomitant EEG showed sharply contoured 7 to 8 Hz theta activity which gradually evolved in morphology and frequency to 4 to 5 Hz theta and eventually 2 to 3 Hz delta slowing and also spread to involve the right hemisphere.  As AEDs were adjusted, frequency of seizures reduced and patient was noted to have 12 seizures between 1130 on 10/23/2020  to 10/24/2020 0924.  Last seizure was noted on 10/24/2020 at 0858.  Average duration of seizures was about 3 minutes. ABNORMALITY -Focal seizure, left posterior quadrant -Lateralized periodic discharges, left posterior quadrant IMPRESSION: This study initially showed frequent seizures (average 2 to 3/h) which gradually improved after adjusting AEDs. Total 12 seizures were noted between 1130 on 10/23/2020  to 10/24/2020 0924. Patient was noted to be confused during the seizures. Seizures are arising from left posterior quadrant, last seizure on 10/24/2020 at 0858, average duration of seizure about 3 minutes.  EEG also showed evidence of epileptogenicity arising from left posterior quadrant due to underlying structural abnormality. Priyanka 12/23/2020    Medications:   . amLODipine  5 mg Oral Daily  . enoxaparin (LOVENOX) injection  40 mg Subcutaneous Q24H  . folic acid  1 mg Oral Daily  . metoprolol tartrate  25 mg Oral BID  . PHENObarbital  130 mg Intravenous Daily  . phenytoin (DILANTIN) IV  100 mg Intravenous Q8H  . sertraline  100 mg Oral Daily   Continuous Infusions: . sodium chloride Stopped (10/23/20 1701)  . levETIRAcetam Stopped (10/25/20 0100)     LOS: 3 days   [name],  MD  [facility]   [facility] How to contact the [facility] Attending or Consulting provider 7A - 7P or covering provider during after hours 7P -7A, for this patient?  1. Check the care team in [facility] and look for a) attending/consulting TR[facility] provider listed and b) the [facility] team listed 2. Log into [email or web address] and use [facility] universal password to access. If you do not have the password, please contact the hospital operator. 3. Locate the [facility] provider you are looking for under [facility] and page to a number that you can be directly reached. 4. If you still have difficulty reaching the provider, please page the [facility] (Director on Call) for the Hospitalists listed on amion for assistance.  10/25/2020, 8:27 AM

## 2020-10-25 NOTE — Procedures (Signed)
Patient Name: Donna Short  MRN: [id number]  Epilepsy Attending: [name]  Referring Physician/Provider: Dr. Lindie Spruce Duration: 10/24/2020 2423 to 10/25/2020 5361  Patient history: 30 year old female with history of epilepsy presented with altered mental status.  EEG to evaluate for seizures.  Level of alertness: Awake, asleep  AEDs during EEG study: LEV, PHT, Phenobarb, Perampanel  Technical aspects: This EEG study was done with scalp electrodes positioned according to the 10-20 International system of electrode placement. Electrical activity was acquired at a sampling rate of 500Hz  and reviewed with a high frequency filter of 70Hz  and a low frequency filter of 1Hz . EEG data were recorded continuously and digitally stored.   Description: The posterior dominant rhythm consists of 9-10 Hz activity of moderate voltage (25-35 uV) seen predominantly in posterior head regions, symmetric and reactive to eye opening and eye closing. Sleep was characterized by vertex waves, sleep spindles (12 to 14 Hz), maximal frontocentral region.  Lateralized periodic discharges with overlying fast activity were noted in left posterior quadrant at 1 Hz.  Patient was noted to have seven seizures. During the seizures patient was confused and not consistently answering questions appropriately. Concomitant EEG showed sharply contoured 7 to 8 Hz theta activity which gradually evolved in morphology and frequency to 4 to 5 Hz theta and eventually 2 to 3 Hz delta slowing and also spread to involve the right hemisphere. Last seizure was noted on 10/25/2020 at 0505.  Average duration of seizures was about 5 minutes.  ABNORMALITY -Focal seizure, left posterior quadrant -Lateralized periodic discharges, left posterior quadrant  IMPRESSION: This study showed seven seizures. Patient was noted to be confused during the seizures. Seizures are arising from left posterior quadrant, last seizure on 10/25/2020 at  0505, average duration of seizure about 5 minutes. EEG also showed evidence of epileptogenicity arising from left posterior quadrant due to underlying structural abnormality.  Josue Falconi 

## 2020-10-26 ENCOUNTER — Other Ambulatory Visit: Payer: Self-pay | Admitting: Internal Medicine

## 2020-10-26 DIAGNOSIS — R4182 Altered mental status, unspecified: Secondary | ICD-10-CM

## 2020-10-26 DIAGNOSIS — G9341 Metabolic encephalopathy: Secondary | ICD-10-CM

## 2020-10-26 DIAGNOSIS — R519 Headache, unspecified: Secondary | ICD-10-CM

## 2020-10-26 LAB — GLUCOSE, CAPILLARY
Glucose-Capillary: 83 mg/dL (ref 70–99)
Glucose-Capillary: 90 mg/dL (ref 70–99)
Glucose-Capillary: 106 mg/dL — ABNORMAL HIGH (ref 70–99)

## 2020-10-26 MED ORDER — LEVETIRACETAM 750 MG PO TABS: 1500.0000 mg | ORAL_TABLET | Freq: Two times a day (BID) | ORAL | 1 refills | Status: DC

## 2020-10-26 MED ORDER — PHENYTOIN 50 MG PO CHEW: 100.0000 mg | CHEWABLE_TABLET | Freq: Three times a day (TID) | ORAL | 1 refills | Status: DC

## 2020-10-26 MED ORDER — PERAMPANEL 4 MG PO TABS: 4.0000 mg | ORAL_TABLET | Freq: Every day | ORAL | 1 refills | Status: DC

## 2020-10-26 MED ORDER — LACOSAMIDE 100 MG PO TABS: 100.0000 mg | ORAL_TABLET | Freq: Two times a day (BID) | ORAL | 0 refills | Status: DC

## 2020-10-26 MED ORDER — PHENOBARBITAL 97.2 MG PO TABS: 97.2000 mg | ORAL_TABLET | Freq: Every day | ORAL | 1 refills | Status: DC

## 2020-10-26 MED FILL — VIMPAT 100 MG TABLET: 100 | 30 days supply | Qty: 60 | Fill #0

## 2020-10-26 NOTE — Progress Notes (Signed)
Subjective: No acute events overnight  ROS: negative except above  Examination  Vital signs in last 24 hours: Temp:  [98.3 F (36.8 C)-98.8 F (37.1 C)] 98.3 F (36.8 C) (03/04 1137) Pulse Rate:  [80-96] 90 (03/04 1137) Resp:  [17-19] 18 (03/04 1137) BP: (107-128)/(87-101) 113/93 (03/04 1137) SpO2:  [100 %] 100 % (03/04 1137)   General: lying in bed,not in apparent distress CVS: pulse-normal rate and rhythm RS: breathing comfortably Extremities: normal  Neuro: AOx3, cranial nerves appear grossly intact, 5/5 in all 4 extremities, FTN intact  Basic Metabolic Panel: Recent Labs  Lab 10/22/20 1414 10/23/20 0402 10/23/20 1531  NA 133* 132* 136  K 3.6 3.2* 4.0  CL 98 100 108  CO2 24 19* 18*  GLUCOSE 99 82 193*  BUN 17 14 17   CREATININE 0.75 0.86 1.15*  CALCIUM 10.2 9.2 8.6*    CBC: Recent Labs  Lab 10/22/20 1414 10/23/20 0402  WBC 7.5 5.7  NEUTROABS 4.5  --   HGB 12.8 10.7*  HCT 36.1 29.8*  MCV 97.3 96.1  PLT 156 142*     Coagulation Studies: No results for input(s): LABPROT, INR in the last 72 hours.  Imaging No new brain imaging overnight  ASSESSMENT AND PLAN:30 year old female with history of epilepsy presented with altered mental status and found to be in nonconvulsive status epilepticus.  Nonconvulsive status epilepticus,resolved Acute encephalopathy, likely ictal,improving - Stat EEG was ordered which showed seizures arising from left posterior quadrant -Etiology for status: Medication noncompliance versus alcohol use/ pancreatitis  Recommendations: -Continue Keppra 1500 mg twice daily, Dilantin 100 mg every 8 hours, phenobarbital 95 mg daily, continue perampanel 4 mg daily and Vimpat 100 mg twice daily - Will discontinue LTM eeg tomorrow if remains sz free -Seizure precautions including do not drive As needed IV Ativan 2 mg for clinical seizure lasting more than 2 minutes -Discussed my plan with patient,her mother on phone  I have  spent a total of74minuteswith the patient reviewing hospitalnotes, test results, labs and examining the patient as well as establishing an assessment and plan that was discussed personally with the patient.>50% of time was spent in direct patient care.     30m Epilepsy [facility] For questions after 5pm please refer to AMION to reach the Neurologist on call

## 2020-10-26 NOTE — TOC Benefit Eligibility Note (Signed)
Transition of Care [facility]) Benefit Eligibility Note    Patient Details  Name: Donna Short MRN: [id number] Date of Birth: 1991/08/21   Medication/Dose: Vimpat 100mg . bid and Fycompa 4mg  tabs  daily pre Auth. required(  Perampanel No -found)  Covered?: Yes        Spoke with Person/Company/Phone Number:: Donna J. W/[employer] PH# [phone]     Prior Approval: Yes [phone])          [phone] Phone Number: 10/26/2020, 10:05 AM

## 2020-10-26 NOTE — Progress Notes (Signed)
PROGRESS NOTE  MARYLIN LATHON GHW:[id number] DOB: 17-Mar-1991 DOA: 10/22/2020 PCP: Orland Mustard, MD   LOS: 4 days   Brief Narrative / Interim history: CORALIE STANKE is an 30 y.o. female with past medical history significant for asthma, depression, hypertension, previous press syndrome, pancreatitis and seizure, currently on medication for seizure who presents to the ED complaining of headache and altered mental status. Patient has a prior history of alcohol use. Patient reportedly fell to the ground and hit her head about 4 days prior to admission. She started to [facility], went to see her PCP and was noted to be confused and referred to the ED for further evaluation.Neurologistwas consulted who recommended loading dose of KeppraandTransfer to [facility]. MRI of the brain showed area of cortical and subcortical hyperintensity. There is question of press syndrome versus chronic encephalomalacia.  Subjective / 24h Interval events: Hoping to go home. 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  Neurology consulted and followed patient while hospitalized. Underwent a stat EEG which showed seizures arising from the left posterior quadrant.  Given 2 mg of IV Ativan.  Keppra dose increased to 1500 mg twice daily and loaded with IV fosphenytoin. Due to persistent seizures her regimen was eventually consolidated with Keppra 1500 mg BID, Vimpat 100 mg BID, phenobarbital 97.2 mg daily and Perampanel 4 mg daily.  -Home tomorrow if no further seizures, remains on continuous EEG for another night  Active Problems Hypertension - continue home regimen Hyponatremia - sodium normalized with IV fluids. Hypokalemia-repleted  Scheduled Meds: . amLODipine  5 mg Oral Daily  . enoxaparin  (LOVENOX) injection  40 mg Subcutaneous Q24H  . folic acid  1 mg Oral Daily  . lacosamide  100 mg Oral BID  . levETIRAcetam  1,500 mg Oral BID  . metoprolol tartrate  25 mg Oral BID  . perampanel  4 mg Oral Daily  . phenobarbital  97.2 mg Oral Daily  . phenytoin  100 mg Oral TID  . sertraline  100 mg Oral Daily   Continuous Infusions: . sodium chloride 100 mL/hr at 10/26/20 0620   PRN Meds:.acetaminophen **OR** acetaminophen, LORazepam, ondansetron **OR** ondansetron (ZOFRAN) IV  Diet Orders (From admission, onward)    Start     Ordered   10/22/20 2215  Diet Heart Room service appropriate? Yes; Fluid consistency: Thin  Diet effective now       Question Answer Comment  Room service appropriate? Yes   Fluid consistency: Thin      10/22/20 2214          DVT prophylaxis: Place and maintain sequential compression device Start: 10/25/20 1554 enoxaparin (LOVENOX) injection 40 mg Start: 10/22/20 2300     Code Status: Full Code  Family Communication: no family at bedside  Status is: Inpatient  Remains inpatient appropriate because:Inpatient level of care appropriate due to severity of illness   Dispo: The patient is from: Home              Anticipated d/c is to: Home              Patient currently is not medically stable to d/c.   Difficult to place patient No  Level of care: Progressive  Consultants:  Neurology   Procedures:  EEG  Microbiology  none  Antimicrobials: none    Objective: Vitals:  10/25/20 2324 10/26/20 0323 10/26/20 0750 10/26/20 0801  BP: 116/88 118/89 107/89 117/87  Pulse: 89 80 87 92  Resp: 18 18 17 19   Temp: 98.6 F (37 C) 98.5 F (36.9 C) 98.3 F (36.8 C) 98.8 F (37.1 C)  TempSrc: Oral Oral Oral Oral  SpO2: 100% 100% 100% 100%  Weight:      Height:        Intake/Output Summary (Last 24 hours) at 10/26/2020 1127 Last data filed at 10/26/2020 0455 Gross per 24 hour  Intake 2111.84 ml  Output 0 ml  Net 2111.84 ml   Filed  Weights   10/22/20 2208  Weight: 51.6 kg    Examination:  Constitutional: NAD Eyes: no scleral icterus ENMT: Mucous membranes are moist.  Neck: normal, supple Respiratory: clear to auscultation bilaterally, no wheezing, no crackles.  Cardiovascular: Regular rate and rhythm, no murmurs / rubs / gallops. No LE edema. Good peripheral pulses Abdomen: non distended, no tenderness. Bowel sounds positive.  Musculoskeletal: no clubbing / cyanosis.  Skin: no rashes Neurologic: CN 2-12 grossly intact. Strength 5/5 in all 4.  Data Reviewed: I have independently reviewed following labs and imaging studies   CBC: Recent Labs  Lab 10/22/20 1414 10/23/20 0402  WBC 7.5 5.7  NEUTROABS 4.5  --   HGB 12.8 10.7*  HCT 36.1 29.8*  MCV 97.3 96.1  PLT 156 142*   Basic Metabolic Panel: Recent Labs  Lab 10/22/20 1414 10/23/20 0402 10/23/20 1531  NA 133* 132* 136  K 3.6 3.2* 4.0  CL 98 100 108  CO2 24 19* 18*  GLUCOSE 99 82 193*  BUN 17 14 17   CREATININE 0.75 0.86 1.15*  CALCIUM 10.2 9.2 8.6*   Liver Function Tests: Recent Labs  Lab 10/22/20 1414 10/23/20 0402  AST 26 27  ALT 21 18  ALKPHOS 49 35*  BILITOT 0.5 0.6  PROT 8.8* 6.5  ALBUMIN 4.4 3.3*   Coagulation Profile: No results for input(s): INR, PROTIME in the last 168 hours. HbA1C: No results for input(s): HGBA1C in the last 72 hours. CBG: Recent Labs  Lab 10/24/20 1605 10/25/20 0605 10/25/20 1111 10/25/20 1612 10/26/20 0607  GLUCAP 114* 103* 102* 92 83    Recent Results (from the past 240 hour(s))  SARS CORONAVIRUS 2 (TAT 6-24 HRS) Nasopharyngeal Nasopharyngeal Swab     Status: None   Collection Time: 10/22/20  8:23 PM   Specimen: Nasopharyngeal Swab  Result Value Ref Range Status   SARS Coronavirus 2 NEGATIVE NEGATIVE Final    Comment: (NOTE) SARS-CoV-2 target nucleic acids are NOT DETECTED.  The SARS-CoV-2 RNA is generally detectable in upper and lower respiratory specimens during the acute phase of  infection. Negative results do not preclude SARS-CoV-2 infection, do not rule out co-infections with other pathogens, and should not be used as the sole basis for treatment or other patient management decisions. Negative results must be combined with clinical observations, patient history, and epidemiological information. The expected result is Negative.  Fact Sheet for Patients: 12/26/20  Fact Sheet for Healthcare Providers: 10/24/20  This test is not yet approved or cleared by the [employer] FDA and  has been authorized for detection and/or diagnosis of SARS-CoV-2 by FDA under an Emergency Use Authorization (EUA). This EUA will remain  in effect (meaning this test can be used) for the duration of the COVID-19 declaration under Se ction 564(b)(1) of the Act, 21 U.S.C. section 360bbb-3(b)(1), unless the authorization is terminated or revoked sooner.  Performed at  [facility], [address] [place], [place], [place] [address]      Radiology Studies: No results found.  Pamella Pert, MD, PhD [facility]  Between 7 am - 7 pm I am available, please contact me via Amion or Securechat  Between 7 pm - 7 am I am not available, please contact night coverage MD/APP via Amion

## 2020-10-26 NOTE — Progress Notes (Signed)
LTM maint complete - no skin breakdown under: FP1,FP2,A1  

## 2020-10-26 NOTE — Progress Notes (Signed)
Benefits check submitted for Vimpat 100 mg BID and perampanel (FYCOMPA) tablet 4 mg daily.

## 2020-10-26 NOTE — Plan of Care (Signed)

## 2020-10-26 NOTE — Procedures (Addendum)
Patient Name:Donna Short WVP:[id number] Epilepsy Attending:Myya Meenach [name] Referring Physician/Provider:Dr. Lindie Spruce Duration:10/24/2020 [phone] to 10/25/2020 [phone]  Patient history:30 year old female with history of epilepsy presented with altered mental status.EEG to evaluate for seizures.  Level of alertness:Awake, asleep  AEDs during EEG study:LEV, PHT, Phenobarb, Perampanel, LCM  Technical aspects: This EEG study was done with scalp electrodes positioned according to the 10-20 International system of electrode placement. Electrical activity was acquired at a sampling rate of 500Hz  and reviewed with a high frequency filter of 70Hz  and a low frequency filter of 1Hz . EEG data were recorded continuously and digitally stored.   Description: The posterior dominant rhythm consists of 9-10 Hz activity of moderate voltage (25-35 uV) seen predominantly in posterior head regions, symmetric and reactive to eye opening and eye closing. Sleep was characterized by vertex waves, sleep spindles (12 to 14 Hz), maximal frontocentral region.Lateralized periodic discharges with overlying fast activity were noted in left posterior quadrant at 1 Hz.  Patient was noted to have twoseizures on 10/25/2020 at 0955 and 1701, 3 and 2 minutes respectively.During the seizures patient was asleep, no other clinical signs were noted. Concomitant EEG showed sharply contoured 7 to 8 Hz theta activity which gradually evolved in morphology and frequency to 4 to 5 Hz theta and eventually 2 to 3 Hz delta slowing and also spread to involve the right hemisphere.   ABNORMALITY -Focal seizure without clinical signs, left posterior quadrant -Lateralized periodic discharges, left posterior quadrant  IMPRESSION: This study showed two seizures without clinical signs, on 10/25/2020 at 0955 and 1701, 3 and 2 minutes respectively, arising from left posterior quadrant. EEG also showed evidence of epileptogenicity  arising from left posterior quadrant due to underlying structural abnormality.  Patric Buckhalter 

## 2020-10-26 NOTE — Progress Notes (Addendum)
  LTM maint complete - no skin breakdown under:  Fp2 F4 F8 Serviced  CZ P8 F8

## 2020-10-26 NOTE — TOC Initial Note (Addendum)
Transition of Care [facility]) - Initial/Assessment Note    Patient Details  Name: Donna Short MRN: [id number] Date of Birth: 08-Aug-1991  [facility] CM/SW Contact:    Kermit Balo, RN Phone Number: 10/26/2020, 1:25 PM  Clinical Narrative:                 CM consulted for benefits check which revealed the 2 medications (vimpat, fycompa need prior auth) CM has called Rosann Auerbach and gotten them both approved for 1 year: Vimpat: [id number] Fycompa: [id number] CM called pts preferred pharmacy and they have Fycompa in stock but not the Vimpat. [facility] to order the Vimpat but wont be in for several days. CM asked MD to send to [facility] and CM covered the cost with the 30 day free card. Medication sent to main pharmacy until she is d/ced. CM provided the co pay card to the patient for future prescriptions. Pts mother at the bedside. CM provided them with the approval numbers for their pharmacy. Mother is also going to provide some supervision at home once pt is d/ced. TOC following for further needs.   Expected Discharge Plan: Home/Self Care Barriers to Discharge: Continued Medical Work up   Patient Goals and CMS Choice        Expected Discharge Plan and Services Expected Discharge Plan: Home/Self Care   Discharge Planning Services: CM Consult   Living arrangements for the past 2 months: Apartment                                      Prior Living Arrangements/Services Living arrangements for the past 2 months: Apartment Lives with:: Self Patient language and need for interpreter reviewed:: Yes Do you feel safe going back to the place where you live?: Yes      Need for Family Participation in Patient Care: Yes (Comment) Care giver support system in place?: Yes (comment)   Criminal Activity/Legal Involvement Pertinent to Current Situation/Hospitalization: No - Comment as needed  Activities of Daily Living   ADL Screening (condition at time of  admission) Patient's cognitive ability adequate to safely complete daily activities?: No Is the patient deaf or have difficulty hearing?: No Does the patient have difficulty seeing, even when wearing glasses/contacts?: No Does the patient have difficulty concentrating, remembering, or making decisions?: Yes Patient able to express need for assistance with ADLs?: No  Permission Sought/Granted                  Emotional Assessment Appearance:: Appears stated age Attitude/Demeanor/Rapport: Engaged Affect (typically observed): Accepting Orientation: : Oriented to Self,Oriented to Place,Oriented to  Time   Psych Involvement: No (comment)  Admission diagnosis:  Seizure (HCC) [R56.9] Fall, initial encounter [W19.XXXA] Altered mental status, unspecified altered mental status type [R41.82] Acute nonintractable headache, unspecified headache type [R51.9] Patient Active Problem List   Diagnosis Date Noted  . Seizure (HCC) 10/22/2020  . PRES (posterior reversible encephalopathy syndrome) 07/18/2020  . Acute on chronic respiratory failure with hypoxia (HCC) 06/04/2020  . Acute metabolic encephalopathy 06/04/2020  . Status post tracheostomy (HCC)   . 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 Pancreatitis, acute 05/11/2020  . AKI (acute kidney injury) (HCC) 05/10/2020  . Depression, recurrent (HCC) 12/26/2019  . Acute pancreatitis 11/17/2019  . Acute on chronic pancreatitis (HCC) 02/24/2019  . Uncomplicated asthma   . Benign essential HTN   . Tachycardia   .  ETOH abuse   . Respiratory failure (HCC)   . Encephalopathy acute   . Seizures (HCC) 12/19/2015   PCP:  Orland Mustard, MD Pharmacy:   [facility] [address], [place] - [address] [place] [address] Phone: [phone] Fax: [phone]  [facility] [place], [place] - [address] [place] [address] Phone: [phone] Fax: [phone]     Social Determinants of Health (SDOH) Interventions    Readmission Risk Interventions No flowsheet data found.

## 2020-10-26 NOTE — Progress Notes (Signed)
LTM maint complete - Cz serviced.

## 2020-10-27 LAB — GLUCOSE, CAPILLARY
Glucose-Capillary: 117 mg/dL — ABNORMAL HIGH (ref 70–99)
Glucose-Capillary: 90 mg/dL (ref 70–99)

## 2020-10-27 MED ORDER — LACOSAMIDE 100 MG PO TABS: 100.0000 mg | ORAL_TABLET | Freq: Two times a day (BID) | ORAL | 0 refills | Status: DC

## 2020-10-27 NOTE — Progress Notes (Signed)
Pt has been discharged from the unit. AVS has been given and reviewed. All Ivs and tele has been discontinued. Pt has all belongings and is sent in good spirits.

## 2020-10-27 NOTE — Progress Notes (Signed)
EEG unhooked, no skin breakdown seen.

## 2020-10-27 NOTE — Discharge Summary (Signed)
Physician Discharge Summary  Donna Short ZOX:[id number] DOB: [birth year] DOA: 10/22/2020  PCP: Orland MustardWolfe, Allison, MD  Admit date: 10/22/2020 Discharge date: 10/27/2020  Admitted From: home Disposition:  home  Recommendations for Outpatient Follow-up:  1. Follow up with PCP in 1-2 weeks 2. Follow-up with neurology as an outpatient  Home Health: none  Equipment/Devices: none  Discharge Condition: stable CODE STATUS: Full code Diet recommendation: regular  HPI: Per admitting MD, Donna Short is a 30 y.o. female with medical history significant of asthma, depression, hypertension, previous PR ES syndrome, pancreatitis and seizures currently on medication for seizure disorder who is in the ER with complaint of headaches and altered mental status.  Patient has prior history of alcohol abuse.  Patient reportedly fell to the ground and hit her head about 4 days ago.  She was doing fine until yesterday when she started having headaches the record.  She went to see her PCP today and was found to be confused and sent to the emergency department.  Her complaints mainly severe frontal headaches.  CT head showed no acute hemorrhage.  She is seem to be withdrawn and confused.  She denied any nausea or vomiting.  Case was discussed with neurologist who recommended loading patient with Keppra.  She was on antiseizure medicines as well.  That has been done and patient still remains confused.  Suspicion is also PR ES syndrome but blood pressure is not excessively elevated.  [facility] point [facility] be [facility] by [facility].   Hospital Course / Discharge diagnoses: Principal Problem Acute encephalopathy secondary to non-convulsive status epilepticus, POA Noted to be confused with altered mental status on admission and has been persistently altered through hospital course. Neurology consulted and followed patient while hospitalized. Underwent a stat EEG which showed  seizures arising from the left posterior quadrant. Given 2 mg of IV Ativan. Keppra dose increased to 1500 mg twice daily and loaded with IV fosphenytoin. Due to persistent seizures her regimen was eventually consolidated with Keppra 1500 mg BID, Vimpat 100 mg BID, phenobarbital 97.2 mg daily and Perampanel 4 mg daily.   Has remained seizure-free and will be discharged home in stable condition.  Patient was advised verbally and in the discharge instructions no driving for at least 6 months per [facility]  Active Problems Hypertension - continue home regimen Hyponatremia - sodium normalized with IV fluids. Hypokalemia-repleted  Sepsis ruled out   Discharge Instructions   Allergies as of 10/27/2020      Reactions   Claritin [loratadine] Itching   Peanut-containing Drug Products Other (See Comments)   Family not sure of reaction   Benadryl [diphenhydramine Hcl (sleep)] Rash   Latex Rash   Shrimp [shellfish Allergy] Hives, Rash      Medication List    STOP taking these medications   mouth rinse Liqd solution     TAKE these medications   acetaminophen 500 MG tablet Commonly known as: TYLENOL Take 1,000 mg by mouth every 6 (six) hours as needed for mild pain.   amLODipine 5 MG tablet Commonly known as: NORVASC TAKE ONE TABLET BY MOUTH DAILY   folic acid 1 MG tablet Commonly known as: FOLVITE Take 1 tablet (1 mg total) by mouth daily.   Lacosamide 100 MG Tabs Take 1 tablet (100 mg total) by mouth 2 (two) times daily.   levETIRAcetam 750 MG tablet Commonly known as: KEPPRA Take 2 tablets (1,500 mg total) by mouth 2 (two) times daily.  What changed:   medication strength  how much to take   metoprolol tartrate 50 MG tablet Commonly known as: LOPRESSOR Take 0.5 tablets (25 mg total) by mouth 2 (two) times daily.   multivitamin with minerals Tabs tablet Place 1 tablet into feeding tube daily. What changed: how to take this   Perampanel 4 MG Tabs Take 1 tablet (4 mg total)  by mouth daily.   PHENobarbital 97.2 MG tablet Commonly known as: LUMINAL Take 1 tablet (97.2 mg total) by mouth daily. What changed:   medication strength  how much to take  how to take this  when to take this  additional instructions   phenytoin 50 MG tablet Commonly known as: DILANTIN Chew 2 tablets (100 mg total) by mouth 3 (three) times daily.   sertraline 100 MG tablet Commonly known as: ZOLOFT Take 1 tablet (100 mg total) by mouth daily.       Follow-up Information    Orland Mustard, MD. Schedule an appointment as soon as possible for a visit in 1 week(s).   Specialty: Family Medicine Contact information: [address] [place] [address] [phone]               Consultations:  Neurology   Procedures/Studies:  EEG  CT Head Wo Contrast  Result Date: 10/22/2020 CLINICAL DATA:  Fall, head injury EXAM: CT HEAD WITHOUT CONTRAST CT CERVICAL SPINE WITHOUT CONTRAST TECHNIQUE: Multidetector CT imaging of the head and cervical spine was performed following the standard protocol without intravenous contrast. Multiplanar CT image reconstructions of the cervical spine were also generated. COMPARISON:  CT head 05/03/2015.  MRI head 07/26/2020 FINDINGS: CT HEAD FINDINGS Brain: No evidence of acute infarction, hemorrhage, hydrocephalus, extra-axial collection or mass lesion/mass effect. Vascular: Negative for hyperdense vessel Skull: Negative Sinuses/Orbits: Paranasal sinuses clear.  Negative orbit Other: None CT CERVICAL SPINE FINDINGS Alignment: Normal alignment Skull base and vertebrae: Negative for fracture Soft tissues and spinal canal: Negative Disc levels: Normal disc spaces. No significant degenerative change. Upper chest: Lung apices clear bilaterally. Other: None IMPRESSION: Negative CT head Negative CT cervical spine Electronically Signed   By: Marlan Palau M.D.   On: 10/22/2020 15:22   CT Cervical Spine Wo Contrast  Result Date:  10/22/2020 CLINICAL DATA:  Fall, head injury EXAM: CT HEAD WITHOUT CONTRAST CT CERVICAL SPINE WITHOUT CONTRAST TECHNIQUE: Multidetector CT imaging of the head and cervical spine was performed following the standard protocol without intravenous contrast. Multiplanar CT image reconstructions of the cervical spine were also generated. COMPARISON:  CT head 05/03/2015.  MRI head 07/26/2020 FINDINGS: CT HEAD FINDINGS Brain: No evidence of acute infarction, hemorrhage, hydrocephalus, extra-axial collection or mass lesion/mass effect. Vascular: Negative for hyperdense vessel Skull: Negative Sinuses/Orbits: Paranasal sinuses clear.  Negative orbit Other: None CT CERVICAL SPINE FINDINGS Alignment: Normal alignment Skull base and vertebrae: Negative for fracture Soft tissues and spinal canal: Negative Disc levels: Normal disc spaces. No significant degenerative change. Upper chest: Lung apices clear bilaterally. Other: None IMPRESSION: Negative CT head Negative CT cervical spine Electronically Signed   By: Marlan Palau M.D.   On: 10/22/2020 15:22   MR BRAIN WO CONTRAST  Result Date: 10/22/2020 CLINICAL DATA:  Head trauma, abnormal mental status.  Possible PRES. EXAM: MRI HEAD WITHOUT CONTRAST TECHNIQUE: Multiplanar, multiecho pulse sequences of the brain and surrounding structures were obtained without intravenous contrast. COMPARISON:  CT head/cervical spine performed earlier today 10/22/2020. Prior brain MRI examinations 07/26/2020 and earlier. FINDINGS: Brain: Mild cerebral and cerebellar atrophy  which is advanced for age. Small regions of cortical/subcortical T2/FLAIR hyperintense signal abnormality within the left parietal and occipital lobes (series 7, images 23-26) (series 7, image 16). These findings were not definitively present on the prior brain MRI of 07/26/2020. Foci of chronic microhemorrhage within the posterior right frontal lobe and right parietal lobe (2 foci total), unchanged from the brain MRI of  07/26/2020. There is no acute infarct. No evidence of intracranial mass. No chronic intracranial blood products. No extra-axial fluid collection. No midline shift. Vascular: Expected proximal arterial flow voids. Skull and upper cervical spine: No focal marrow lesion. Sinuses/Orbits: Visualized orbits show no acute finding. Trace bilateral ethmoid sinus mucosal thickening. IMPRESSION: Small regions of cortical/subcortical T2/FLAIR hyperintensity within the left parietal and occipital lobes. These findings were not definitively present on the prior MRI of 07/26/2020. Primary differential considerations in this patient include early signal changes related to hypertensive encephalopathy/PRES versus subtle chronic encephalomalacia from prior episodes (seen to better advantage on today's exam). Stable chronic microhemorrhages within the right frontal and parietal lobes (2 total). Mild cerebral and cerebellar atrophy, advanced for age. Electronically Signed   By: Jackey Loge DO   On: 10/22/2020 19:15   Overnight EEG with video  Result Date: 10/24/2020 [name], MD     10/24/2020 12:35 PM Patient Name: Donna Short MRN: [id number] Epilepsy Attending: [name] Referring Physician/Provider: Dr. Lindie Spruce Duration: 10/23/2020 6295 to 10/24/2020 0924 Patient history: 30 year old female with history of epilepsy presented with altered mental status.  EEG to evaluate for seizures. Level of alertness: Awake, asleep AEDs during EEG study: LEV, PHT, Phenobarb Technical aspects: This EEG study was done with scalp electrodes positioned according to the 10-20 International system of electrode placement. Electrical activity was acquired at a sampling rate of  and reviewed with a high frequency filter of  and a low frequency filter of . EEG data were recorded continuously and digitally stored. Description: The posterior dominant rhythm consists of 9-10 Hz activity of moderate voltage (25-35 uV) seen  predominantly in posterior head regions, symmetric and reactive to eye opening and eye closing. Sleep was characterized by vertex waves, sleep spindles (12 to 14 Hz), maximal frontocentral region.  Lateralized periodic discharges with overlying fast activity were noted in left posterior quadrant at 1 Hz. At the beginning of the study, patient was noted to have frequent ( avg 2-3/hour) seizures. During the seizures patient was confused and not consistently answering questions appropriately while interacting with someone without any other clinical signs.  Concomitant EEG showed sharply contoured 7 to 8 Hz theta activity which gradually evolved in morphology and frequency to 4 to 5 Hz theta and eventually 2 to 3 Hz delta slowing and also spread to involve the right hemisphere.  As AEDs were adjusted, frequency of seizures reduced and patient was noted to have 12 seizures between 1130 on 10/23/2020  to 10/24/2020 0924.  Last seizure was noted on 10/24/2020 at 0858.  Average duration of seizures was about 3 minutes. ABNORMALITY -Focal seizure, left posterior quadrant -Lateralized periodic discharges, left posterior quadrant IMPRESSION: This study initially showed frequent seizures (average 2 to 3/h) which gradually improved after adjusting AEDs. Total 12 seizures were noted between 1130 on 10/23/2020  to 10/24/2020 0924. Patient was noted to be confused during the seizures. Seizures are arising from left posterior quadrant, last seizure on 10/24/2020 at 0858, average duration of seizure about 3 minutes.  EEG also showed evidence of epileptogenicity arising from left posterior quadrant  due to underlying structural abnormality. Donna Short      Subjective: - no chest pain, shortness of breath, no abdominal pain, nausea or vomiting.   Discharge Exam: BP 100/77 (BP Location: Left Arm)   Pulse 84   Temp 98.4 F (36.9 C) (Oral)   Resp 18   Ht 5\' 2"  (1.575 m)   Wt 51.6 kg   SpO2 100%   BMI 20.81 kg/m   General: Pt  is alert, awake, not in acute distress Cardiovascular: RRR, S1/S2 +, no rubs, no gallops Respiratory: CTA bilaterally, no wheezing, no rhonchi Abdominal: Soft, NT, ND, bowel sounds + Extremities: no edema, no cyanosis    The results of significant diagnostics from this hospitalization (including imaging, microbiology, ancillary and laboratory) are listed below for reference.     Microbiology: Recent Results (from the past 240 hour(s))  SARS CORONAVIRUS 2 (TAT 6-24 HRS) Nasopharyngeal Nasopharyngeal Swab     Status: None   Collection Time: 10/22/20  8:23 PM   Specimen: Nasopharyngeal Swab  Result Value Ref Range Status   SARS Coronavirus 2 NEGATIVE NEGATIVE Final    Comment: (NOTE) SARS-CoV-2 target nucleic acids are NOT DETECTED.  The SARS-CoV-2 RNA is generally detectable in upper and lower respiratory specimens during the acute phase of infection. Negative results do not preclude SARS-CoV-2 infection, do not rule out co-infections with other pathogens, and should not be used as the sole basis for treatment or other patient management decisions. Negative results must be combined with clinical observations, patient history, and epidemiological information. The expected result is Negative.  Fact Sheet for Patients: 10/24/20  Fact Sheet for Healthcare Providers: HairSlick.no  This test is not yet approved or cleared by the quierodirigir.com FDA and  has been authorized for detection and/or diagnosis of SARS-CoV-2 by FDA under an Emergency Use Authorization (EUA). This EUA will remain  in effect (meaning this test can be used) for the duration of the COVID-19 declaration under Se ction 564(b)(1) of the Act, 21 U.S.C. section 360bbb-3(b)(1), unless the authorization is terminated or revoked sooner.  Performed at [facility], [address], [place], [place]      Labs: Basic Metabolic  Panel: Recent Labs  Lab 10/22/20 1414 10/23/20 0402 10/23/20 1531  NA 133* 132* 136  K 3.6 3.2* 4.0  CL 98 100 108  CO2 24 19* 18*  GLUCOSE 99 82 193*  BUN 17 14 17   CREATININE 0.75 0.86 1.15*  CALCIUM 10.2 9.2 8.6*   Liver Function Tests: Recent Labs  Lab 10/22/20 1414 10/23/20 0402  AST 26 27  ALT 21 18  ALKPHOS 49 35*  BILITOT 0.5 0.6  PROT 8.8* 6.5  ALBUMIN 4.4 3.3*   CBC: Recent Labs  Lab 10/22/20 1414 10/23/20 0402  WBC 7.5 5.7  NEUTROABS 4.5  --   HGB 12.8 10.7*  HCT 36.1 29.8*  MCV 97.3 96.1  PLT 156 142*   CBG: Recent Labs  Lab 10/25/20 1612 10/26/20 0607 10/26/20 1136 10/26/20 1606 10/27/20 0631  GLUCAP 92 83 90 106* 117*   Hgb A1c No results for input(s): HGBA1C in the last 72 hours. Lipid Profile No results for input(s): CHOL, HDL, LDLCALC, TRIG, CHOLHDL, LDLDIRECT in the last 72 hours. Thyroid function studies No results for input(s): TSH, T4TOTAL, T3FREE, THYROIDAB in the last 72 hours.  Invalid input(s): FREET3 Urinalysis    Component Value Date/Time   COLORURINE YELLOW 10/23/2020 1900   APPEARANCEUR HAZY (A) 10/23/2020 1900  LABSPEC 1.019 10/23/2020 1900   PHURINE 5.0 10/23/2020 1900   GLUCOSEU NEGATIVE 10/23/2020 1900   HGBUR NEGATIVE 10/23/2020 1900   BILIRUBINUR NEGATIVE 10/23/2020 1900   KETONESUR NEGATIVE 10/23/2020 1900   PROTEINUR NEGATIVE 10/23/2020 1900   UROBILINOGEN 0.2 10/07/2015 1958   NITRITE NEGATIVE 10/23/2020 1900   LEUKOCYTESUR NEGATIVE 10/23/2020 1900    FURTHER DISCHARGE INSTRUCTIONS:   Get Medicines reviewed and adjusted: Please take all your medications with you for your next visit with your Primary MD   Laboratory/radiological data: Please request your Primary MD to go over all hospital tests and procedure/radiological results at the follow up, please ask your Primary MD to get all Hospital records sent to his/her office.   In some cases, they will be blood work, cultures and biopsy results  pending at the time of your discharge. Please request that your primary care M.D. goes through all the records of your hospital data and follows up on these results.   Also Note the following: If you experience worsening of your admission symptoms, develop shortness of breath, life threatening emergency, suicidal or homicidal thoughts you must seek medical attention immediately by calling 911 or calling your MD immediately  if symptoms less severe.   You must read complete instructions/literature along with all the possible adverse reactions/side effects for all the Medicines you take and that have been prescribed to you. Take any new Medicines after you have completely understood and accpet all the possible adverse reactions/side effects.    Do not drive when taking Pain medications or sleeping medications (Benzodaizepines)   Do not take more than prescribed Pain, Sleep and Anxiety Medications. It is not advisable to combine anxiety,sleep and pain medications without talking with your primary care practitioner   Special Instructions: If you have smoked or chewed Tobacco  in the last 2 yrs please stop smoking, stop any regular Alcohol  and or any Recreational drug use.   Wear Seat belts while driving.   Please note: You were cared for by a hospitalist during your hospital stay. Once you are discharged, your primary care physician will handle any further medical issues. Please note that NO REFILLS for any discharge medications will be authorized once you are discharged, as it is imperative that you return to your primary care physician (or establish a relationship with a primary care physician if you do not have one) for your post hospital discharge needs so that they can reassess your need for medications and monitor your lab values.  Time coordinating discharge: 40 minutes  SIGNED:  Pamella Pert, MD, PhD 10/27/2020, 9:36 AM

## 2020-10-27 NOTE — Progress Notes (Addendum)
Neurology Progress Note  S: No overnight events. No seizure activity. She feels well. Wants to go home today. Feels her mind is clear. She is unclear about when to take her AEDs and NP explained that they must be taken at the same time everyday and that some will be 3 times and day and some 2 times a day. Stressed importance of being within a 30 min window of taking meds as outside this window, could cause her to have seizures. She was not happy to hear about [place] seizure driving laws.   O: Current vital signs: BP 100/77 (BP Location: Left Arm)   Pulse 84   Temp 98.4 F (36.9 C) (Oral)   Resp 18   Ht 5\' 2"  (1.575 m)   Wt 51.6 kg   SpO2 100%   BMI 20.81 kg/m  Vital signs in last 24 hours: Temp:  [98.3 F (36.8 C)-99.4 F (37.4 C)] 98.4 F (36.9 C) (03/05 0837) Pulse Rate:  [72-90] 84 (03/05 0837) Resp:  [14-18] 18 (03/05 0837) BP: (100-160)/(77-101) 100/77 (03/05 0837) SpO2:  [97 %-100 %] 100 % (03/05 0837)  GENERAL: Awake, alert in NAD HEENT: Normocephalic and atraumatic LUNGS: Normal respiratory effort.  CV: RRR  Ext: warm  NEURO:  Mental Status: AA&Ox3  Speech/Language: speech is without aphasia or dysarthria.  Naming, repetition, fluency, and comprehension intact.  Cranial Nerves:  II: PERRL. Visual fields full.  III, IV, VI: EOMI. Eyelids elevate symmetrically.  V: Sensation is intact to light touch and symmetrical to face.  VII: Smile is symmetrical.  VIII: hearing intact to voice. IX, X: Palate elevates symmetrically. Phonation is normal.  12-07-1972 shrug 5/5. XII: tongue is midline without fasciculations. Motor: 5/5 strength to all muscle groups tested.  Tone: is normal and bulk is normal Sensation- Intact to light touch bilaterally. Extinction absent to light touch to DSS.    Coordination: FTN intact bilaterally. No drift.  Gait- deferred  Medications  Current Facility-Administered Medications:  .  acetaminophen (TYLENOL) tablet 650 mg, 650 mg, Oral, Q6H  PRN, 650 mg at 10/24/20 2025 **OR** acetaminophen (TYLENOL) suppository 650 mg, 650 mg, Rectal, Q6H PRN, Garba, Mohammad L, MD .  amLODipine (NORVASC) tablet 5 mg, 5 mg, Oral, Daily, 2026, Mohammad L, MD, 5 mg at 10/26/20 1009 .  enoxaparin (LOVENOX) injection 40 mg, 40 mg, Subcutaneous, Q24H, Garba, Mohammad L, MD, 40 mg at 10/26/20 2109 .  folic acid (FOLVITE) tablet 1 mg, 1 mg, Oral, Daily, 2110, Mohammad L, MD, 1 mg at 10/26/20 1009 .  lacosamide (VIMPAT) tablet 100 mg, 100 mg, Oral, BID, 12/26/20, MD, 100 mg at 10/26/20 2110 .  levETIRAcetam (KEPPRA) tablet 1,500 mg, 1,500 mg, Oral, BID, 2111, MD, 1,500 mg at 10/26/20 2110 .  LORazepam (ATIVAN) injection 2 mg, 2 mg, Intravenous, Q4H PRN, 2111, MD .  metoprolol tartrate (LOPRESSOR) tablet 25 mg, 25 mg, Oral, BID, [name], Mohammad L, MD, 25 mg at 10/26/20 2110 .  ondansetron (ZOFRAN) tablet 4 mg, 4 mg, Oral, Q6H PRN **OR** ondansetron (ZOFRAN) injection 4 mg, 4 mg, Intravenous, Q6H PRN, 2111, Mohammad L, MD .  perampanel (FYCOMPA) tablet 4 mg, 4 mg, Oral, Daily, Mikeal Hawthorne, MD, 4 mg at 10/26/20 1008 .  PHENobarbital (LUMINAL) tablet 97.2 mg, 97.2 mg, Oral, Daily, 12/26/20, Priyanka O, MD, 97.2 mg at 10/26/20 1009 .  phenytoin (DILANTIN) chewable tablet 100 mg, 100 mg, Oral, TID, 12/26/20, MD, 100 mg at 10/26/20 2110 .  sertraline (ZOLOFT) tablet 100 mg, 100 mg, Oral, Daily, Garba, Mohammad L, MD, 100 mg at 10/26/20 1007  EEG 10/24/20 This study initially showed frequent seizures (average 2 to 3/h) which gradually improved after adjusting AEDs. Total 12 seizures were noted between 1130 on 10/23/2020  to 10/24/2020 0924. Patient was noted to be confused during the seizures. Seizures are arising from left posterior quadrant, last seizure on 10/24/2020 at 0858, average duration of seizure about 3 minutes.  EEG also showed evidence of epileptogenicity arising from left posterior quadrant due to underlying  structural abnormality. 10/25/20 This study showed seven seizures.Patient was noted to be confused during the seizures. Seizures arearising from left posterior quadrant, last seizure on 3/3/2022at0505, average duration of seizure about 5 minutes. EEG also showed evidence of epileptogenicity arising from left posterior quadrant due to underlying structural abnormality. 10/26/20 This study showedtwo seizures without clinical signs, on 10/25/2020 at 0955 and 1701, 3 and 2 minutes respectively, arising from left posterior quadrant. EEG also showed evidence of epileptogenicity arising from left posterior quadrant due to underlying structural abnormality. 10/27/20 This study showed evidence of epileptogenicity arising from left posterior quadrant due to underlying structural abnormality. No seizures were seen during this study.   Assessment/summary: 30 yo female with known history of epilepsy who presented to [facility] 4 days ago with HA and AMS. She was on Keppra and possibly phenobarbital at that time. CT head was negative for acute finding and MRI brain showed no acute abnormality but cortical/subcorical T2 and flair hyperintensity in the left parietal-occipital region concerning for PRES. Neurology was consulted on 10/23/20 due to her confusion. 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 Infectious workup was negative. Keppra was increased, and fosphenytoin was loaded. Phenobarbital level was 1608 and dose patient was taking was unclear. She was started back on 30mg  daily and Dilantin 100mg  po q8 hrs was started. After this, the EEG continued to be positive for seizures and Vimpat was loaded and 100mg  po bid was started. Later, Perampanel 4mg  daily was started as well. By 10/27/20, her EEG was negative for seizures and above medications were continued.   Plan: 1. Patient may be discharged today from neurological standpoint. 2. She should be  discharged on these AEDs--Keppra 1500mg  po q12 hours, Vimpat 100mg  po bid, Perampanel 4mg  po qd, Dilantin 100mg  po tid, and Phenobarb 97.2mg  po qd.  3. f/up out patient neurology with Dr. .   NP spoke at length about patient's medications-taking on time every day. Trying not to let 30 mins go under or over time of medications. Discussed no driving per [place] laws x 6 months or until told she can by out patient neurologist. Explained that the 6 month clock starts over everytime she has a seizure.   Per [facility] statutes, patients with seizures are not allowed to drive until they have been seizure-free for six months.   Use caution when using heavy equipment or power tools. Avoid working on ladders or at heights. Take showers instead of baths. Ensure the water temperature is not too high on the home water heater. Do not go swimming alone. Do not lock yourself in a room alone (i.e. bathroom). When caring for infants or small children, sit down when holding, feeding, or changing them to minimize risk of injury to the child in the event you have a seizure. Maintain good sleep hygiene. Avoid alcohol.   If patient has another  seizure, call 911 and bring them back to the ED if: A. The seizure lasts longer than 5 minutes.  B. The patient doesn't wake shortly after the seizure or has new problems such as difficulty seeing, speaking or moving following the seizure C. The patient was injured during the seizure D. The patient has a temperature over 102 F (39C) E. The patient vomited during the seizure and now is having trouble breathing  Pt seen by Jimmye Norman, MSN, APN-BC/Nurse Practitioner. Plan discussed with MD who agrees.  Pager: [phone]

## 2020-10-27 NOTE — Procedures (Addendum)
Patient Name:Donna Short JQB:[id number] Epilepsy Attending:Kelsa Jaworowski [name] Referring Physician/Provider:Dr. Lindie Spruce Duration:10/25/2020 [phone] to 10/26/2020 0850  Patient history:30 year old female with history of epilepsy presented with altered mental status.EEG to evaluate for seizures.  Level of alertness:Awake, asleep  AEDs during EEG study:LEV, PHT, Phenobarb, Perampanel, LCM  Technical aspects: This EEG study was done with scalp electrodes positioned according to the 10-20 International system of electrode placement. Electrical activity was acquired at a sampling rate of 500Hz  and reviewed with a high frequency filter of 70Hz  and a low frequency filter of 1Hz . EEG data were recorded continuously and digitally stored.   Description: The posterior dominant rhythm consists of 9-10 Hz activity of moderate voltage (25-35 uV) seen predominantly in posterior head regions, symmetric and reactive to eye opening and eye closing. Sleep was characterized by vertex waves, sleep spindles (12 to 14 Hz), maximal frontocentral region.Lateralized periodic discharges were noted in left posterior quadrant at 1 Hz.  ABNORMALITY -Lateralized periodic discharges, left posterior quadrant  IMPRESSION: This study showed evidence of epileptogenicity arising from left posterior quadrant due to underlying structural abnormality. No seizures were seen during this study.   Leor Whyte 

## 2020-10-29 ENCOUNTER — Telehealth: Payer: Self-pay

## 2020-10-29 NOTE — Telephone Encounter (Signed)
Patients nurse case manager with [name] is trying to reach her in regard to in patient care.  Would like to follow up with her in regard to her long term care and keeping her out of the hospital.  Would like to know if we could pass her info along at her next appt on 3/9 for patient to follow up in regard.

## 2020-10-29 NOTE — Telephone Encounter (Signed)
error 

## 2020-10-29 NOTE — Telephone Encounter (Cosign Needed)
Transition Care Management Follow-up Telephone Call  Date of discharge and from where: [facility] 10/27/20  How have you been since you were released from the hospital? [facility]   Any questions or concerns? No  Items Reviewed:  Did the pt receive and understand the discharge instructions provided? Yes   Medications obtained and verified? Yes   Other? Yes  pt stated she had no folic acid and dilantin is on order.   Any new allergies since your discharge? No   Dietary orders reviewed? Yes  Do you have support at home? Yes   Home Care and Equipment/Supplies: Were home health services ordered? not applicable If so, what is the name of the agency?   Has the agency set up a time to come to the patient's home? not applicable Were any new equipment or medical supplies ordered?  No What is the name of the medical supply agency?  Were you able to get the supplies/equipment? not applicable Do you have any questions related to the use of the equipment or supplies? No  Functional Questionnaire: (I = Independent and D = Dependent) ADLs: I  Bathing/Dressing- I  Meal Prep- I  Eating- I  Maintaining continence- I  Transferring/Ambulation- I  Managing Meds- I  Follow up appointments reviewed:   PCP Hospital f/u appt confirmed? Yes  Scheduled to see Dr Artis Flock on 10/31/20 @ 10:30.  Specialist Hospital f/u appt confirmed? No  Scheduled to see Pt stated she will call for appt today 10/29/20  Are transportation arrangements needed? No   If their condition worsens, is the pt aware to call PCP or go to the Emergency Dept.? Yes  Was the patient provided with contact information for the PCP's office or ED? Yes  Was to pt encouraged to call back with questions or concerns? Yes

## 2020-10-30 NOTE — Telephone Encounter (Signed)
Patient notified to contact case manager was given phone #, she voices understanding.

## 2020-10-31 ENCOUNTER — Encounter: Payer: Self-pay | Admitting: Family Medicine

## 2020-10-31 ENCOUNTER — Other Ambulatory Visit: Payer: Self-pay

## 2020-10-31 ENCOUNTER — Ambulatory Visit (INDEPENDENT_AMBULATORY_CARE_PROVIDER_SITE_OTHER): Payer: Managed Care, Other (non HMO) | Admitting: Family Medicine

## 2020-10-31 VITALS — BP 120/78 | HR 103 | Temp 97.2°F | Ht 62.0 in | Wt 119.4 lb

## 2020-10-31 DIAGNOSIS — N179 Acute kidney failure, unspecified: Secondary | ICD-10-CM | POA: Diagnosis not present

## 2020-10-31 DIAGNOSIS — E876 Hypokalemia: Secondary | ICD-10-CM | POA: Diagnosis not present

## 2020-10-31 DIAGNOSIS — G40901 Epilepsy, unspecified, not intractable, with status epilepticus: Secondary | ICD-10-CM

## 2020-10-31 LAB — BASIC METABOLIC PANEL
BUN: 19 mg/dL (ref 6–23)
CO2: 28 mEq/L (ref 19–32)
Calcium: 10.3 mg/dL (ref 8.4–10.5)
Chloride: 98 mEq/L (ref 96–112)
Creatinine, Ser: 0.86 mg/dL (ref 0.40–1.20)
GFR: 89.78 mL/min (ref >60.00)
Glucose, Bld: 112 mg/dL — ABNORMAL HIGH (ref 70–99)
Potassium: 4.1 mEq/L (ref 3.5–5.1)
Sodium: 138 mEq/L (ref 135–145)

## 2020-10-31 NOTE — Telephone Encounter (Signed)
FYI; Pt has app today.

## 2020-10-31 NOTE — Patient Instructions (Signed)
-  ask Dr. Terrace Arabia about expensive medication (perampanel) -ask Dr. Terrace Arabia about work and filling out note. Im not comfortable releasing you not on all of your meds, etc.    Checking kidneys/potassium level on you for follow up from hospital.   See me for regular f/u.  Hang in there! Dr. Artis Flock

## 2020-10-31 NOTE — Progress Notes (Signed)
Patient: Donna Short MRN: [id number] DOB: 1990-08-27 PCP: Orland Mustard, MD     Subjective:  Chief Complaint  Patient presents with  . Letter for School/Work  . Hospitalization Follow-up  . Seizures    HPI: The patient is a 30 y.o. female who presents today hospital follow up as well as a for a work note, she says that she feels better.   Admit date: 10/22/2020 Discharge date: 10/27/2020  HPI: Per admitting MD, Donna Badley Johnsonis a 30 y.o.femalewith medical history significant ofasthma, depression, hypertension, previous PR ES syndrome, pancreatitis and seizures currently on medication for seizure disorder who is in the ER with complaint of headaches and altered mental status. Patient has prior history of alcohol abuse. Patient reportedly fell to the ground and hit her head about 4 days ago. She was doing fine until yesterday when she started having headaches the record. She went to see her PCP today and was found to be confused and sent to the emergency department. Her complaints mainly severe frontal headaches. CT head showed no acute hemorrhage. She is seem to be withdrawn and confused. She denied any nausea or vomiting. Case was discussed with neurologist who recommended loading patient with Keppra. She was on antiseizure medicines as well. That has been done and patient still remains confused. Suspicion is also PR ES syndrome but blood pressure is not excessively elevated. At this point plan is to transfer patient to [facility]: To be evaluated by neurology.   1) acute encephalopathy secondary to non-convulsive status epilepticus -neurology consulted and followed patient during duration of hospitalization.  -stat EEG showed seizures arising form the left posterior quadrant. Given 2mg  IV ativan.  -keppra dosed increased to 1500mg  BID and she was loaded with IV fospheynytoin. She continued to have persistent seizures and medication added included vimpat 100mg  TID,  phenobarbital 97.2mg  and perampanel 4mg /daily. She remained seizure free on this regimen.  She is doing well. No seizures/confusion since d/c. Taking what medication she has. Still seems to get confused with medication. Has not gotten her dilantin yet and she tells me her perampanel is over 1000 dollars. She does have 5 pills. Has f/u with neurology tomorrow.   2) hypokalemia -repleted and normal on d/c.   3) AKI Given IVF, needs repeat bmp today  4) hypertension -well controlled on home medication, no changes made to her medicine.   Review of Systems  Constitutional: Negative for chills, fatigue and fever.  HENT: Negative for dental problem, ear pain, hearing loss and trouble swallowing.   Eyes: Negative for visual disturbance.  Respiratory: Negative for cough, chest tightness and shortness of breath.   Cardiovascular: Negative for chest pain, palpitations and leg swelling.  Gastrointestinal: Negative for abdominal pain, blood in stool, diarrhea and nausea.  Endocrine: Negative for cold intolerance, polydipsia, polyphagia and polyuria.  Genitourinary: Negative for dysuria and hematuria.  Musculoskeletal: Negative for arthralgias.  Skin: Negative for rash.  Neurological: Negative for dizziness and headaches.  Psychiatric/Behavioral: Negative for dysphoric mood and sleep disturbance. The patient is not nervous/anxious.     Allergies Patient is allergic to claritin [loratadine], peanut-containing drug products, benadryl [diphenhydramine hcl (sleep)], latex, and shrimp [shellfish allergy].  Past Medical History Patient  has a past medical history of Asthma, Depression, History of chicken pox, Hypertension, Migraines, Pancreatitis (01/2018), and Seizures (HCC).  Surgical History Patient  has a past surgical history that includes Wisdom tooth extraction.  Family History Pateint's family history includes Alcohol abuse in her father; Arthritis in her father;  Asthma in her mother; Breast  cancer in her mother; Diabetes in her father; Drug abuse in her mother; Hypertension in her mother.  Social History Patient  reports that she quit smoking about 2 years ago. Her smoking use included cigarettes. She has never used smokeless tobacco. She reports current alcohol use of about 9.0 standard drinks of alcohol per week. She reports that she does not use drugs.    Objective: Vitals:   10/31/20 1040  BP: 120/78  Pulse: (!) 103  Temp: (!) 97.2 F (36.2 C)  TempSrc: Temporal  SpO2: 100%  Weight: 119 lb 6.4 oz (54.2 kg)  Height: 5\' 2"  (1.575 m)    Body mass index is 21.84 kg/m.  Physical Exam Vitals reviewed.  Constitutional:      Appearance: Normal appearance. She is normal weight.  HENT:     Head: Normocephalic and atraumatic.  Cardiovascular:     Rate and Rhythm: Normal rate and regular rhythm.     Heart sounds: Normal heart sounds.  Pulmonary:     Effort: Pulmonary effort is normal.     Breath sounds: Normal breath sounds.  Abdominal:     General: Bowel sounds are normal.     Palpations: Abdomen is soft.  Musculoskeletal:     Cervical back: Normal range of motion and neck supple.  Neurological:     General: No focal deficit present.     Mental Status: She is alert and oriented to person, place, and time.  Psychiatric:        Mood and Affect: Mood normal.        Behavior: Behavior normal.   hospital notes, imaging and labs reviewed.      Assessment/plan: 1. 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 They are $1000. Pharmacy gave her a few until she followed up. Recommended she talk to neurology regarding this. She sees them tomorrow.  -keppra increased to 1500mg  BID, she is taking this.  -phenobarbital 97.2mg  daily, she is taking this -dilantin 100mg  TID, she doesn't have this yet. Should be able to pick up soon.  -she has been back to baseline since discharge.  -No driving, but she doesn't  drive.  -gets easily confused regarding medication. All written down for her.  -she is needing a note to be released to work. I want her to see Neurology as I dont' feel comfortable releasing her and would prefer she release her.   2) hypertension To goal, continue home medication.   3) AKI Checking bmp today. She is drinking water.    This visit occurred during the SARS-CoV-2 public health emergency.  Safety protocols were in place, including screening questions prior to the visit, additional usage of staff PPE, and extensive cleaning of exam room while observing appropriate contact time as indicated for disinfecting solutions.     Return if symptoms worsen or fail to improve.   , MD [facility]   10/31/2020

## 2020-11-01 ENCOUNTER — Telehealth: Payer: Self-pay | Admitting: Neurology

## 2020-11-01 ENCOUNTER — Ambulatory Visit (INDEPENDENT_AMBULATORY_CARE_PROVIDER_SITE_OTHER): Payer: Managed Care, Other (non HMO) | Admitting: Neurology

## 2020-11-01 ENCOUNTER — Encounter: Payer: Self-pay | Admitting: Neurology

## 2020-11-01 VITALS — BP 102/69 | HR 101 | Ht 62.0 in | Wt 120.5 lb

## 2020-11-01 DIAGNOSIS — G40901 Epilepsy, unspecified, not intractable, with status epilepticus: Secondary | ICD-10-CM | POA: Diagnosis not present

## 2020-11-01 DIAGNOSIS — R9089 Other abnormal findings on diagnostic imaging of central nervous system: Secondary | ICD-10-CM | POA: Insufficient documentation

## 2020-11-01 MED ORDER — PHENOBARBITAL 97.2 MG PO TABS: 97.2000 mg | ORAL_TABLET | Freq: Every day | ORAL | 5 refills | Status: DC

## 2020-11-01 MED ORDER — LACOSAMIDE 100 MG PO TABS: 100.0000 mg | ORAL_TABLET | Freq: Two times a day (BID) | ORAL | 5 refills | Status: DC

## 2020-11-01 MED ORDER — LEVETIRACETAM 750 MG PO TABS: 1500.0000 mg | ORAL_TABLET | Freq: Two times a day (BID) | ORAL | 11 refills | Status: DC

## 2020-11-01 NOTE — Patient Instructions (Signed)
Tapering off Dilantin 100mg   3 tabs every night for 2 weeks,  Then 2 tabs every night for 2 weeks,  One tab every night for 2 weeks.  Then stop

## 2020-11-01 NOTE — Progress Notes (Signed)
Chief Complaint  Patient presents with  . Follow-up    Here with her godfather, Kellie Shropshire. She has recent hospital admission for seizure activity (from 10/22/20 to 10/27/20). She is now taking the following medications: lacosamide 100mg , one tab BID, levetiracetam 750mg , two tabs BID, phenobarbital 97.2mg , one tab QD, perampanel 4mg , one tab QD (not able to afford this rx and needs alternate), phenytoin chews 50mg , two tabs TID (not started yet - on order at the pharmacy). She would like to also discuss returning to work in the at .     ASSESSMENT AND PLAN  Donna MELLETTE is a 30 y.o. 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) in September 2021 Hospital admission again in March 2022 for partial status epilepticus  She is now on polypharmacy treatment, deny recurrent seizure, or confusion spells since hospital discharge on October 27, 2020  Will continue lacosamide 100 mg twice a day, Keppra 750 mg 2 tablets twice a day, phenobarbital 97.2 mg daily, she could not afford perampanel, have not started on Dilantin 50 mg 2 tablets 3 times a day yet  We will keep her on current dose of lacosamide, Keppra, phenobarbital, check level  With her frequent recurrent seizure, poor social support, I have provided information for applying for disability  No driving until seizure-free in 6 months  DIAGNOSTIC DATA (LABS, IMAGING, TESTING) - I reviewed patient records, labs, notes, testing and imaging myself where available.  Video EEG monitoring March 1 to 5 2022.  This study initially showed frequent seizures (average 2 to 3/h) which gradually improved after adjusting AEDs. Total 12 seizures were noted between 1130 on 10/23/2020  to 10/24/2020 0924. Patient was noted to be confused during the seizures. Seizures are arising from left posterior quadrant, last seizure on 10/24/2020 at 0858, average duration of seizure about 3 minutes.  EEG  also showed evidence of epileptogenicity arising from left posterior quadrant due to underlying structural abnormality.  2023   I personally reviewed MRI of the brain with without contrast on October 22, 2020, in comparison with previous MRI, small region of cortical/subcortical T2/flair hyperintensity within the left parietal and occipital lobes, this findings were not present at previous MRI of October 25, 2019, primary consideration in this patient including early signal changes related to hypertensive encephalopathy/press versus subtle chronic encephalomalacia from previous episode, stable chronic microhemorrhage within the right frontal and parietal lobe, mild cerebral and cerebellar atrophy, advanced for her age  PHYSICAL EXAMNIATION: Blood pressure 102/69, heart rate of 101, weight 120, height 5 2,  Gen: NAD, conversant, well nourised, well groomed                     Cardiovascular: Regular rate rhythm, no peripheral edema, warm, nontender. Eyes: Conjunctivae clear without exudates or hemorrhage Neck: Supple, no carotid bruits. Pulmonary: Clear to auscultation bilaterally   NEUROLOGICAL EXAM:  MENTAL STATUS: Speech/cognition: Awake alert oriented to history taking care of conversation   CRANIAL NERVES: CN II: Visual fields are full to confrontation. Pupils are round equal and briskly reactive to light. CN III, IV, VI: extraocular movement are normal. No ptosis. CN V: Facial sensation is intact to light touch CN VII: Face is symmetric with normal eye closure  CN VIII: Hearing is normal to causal conversation. CN IX, X: Phonation is normal. CN XI: Head turning and shoulder shrug are intact  MOTOR: There is no pronator drift of out-stretched arms. Muscle  bulk and tone are normal. Muscle strength is normal.  REFLEXES: Reflexes are 2+ and symmetric at the biceps, triceps, knees, and ankles. Plantar responses are flexor.  SENSORY: Intact to light touch, pinprick  and vibratory sensation are intact in fingers and toes.  COORDINATION: There is no trunk or limb dysmetria noted.  GAIT/STANCE: Posture is normal. Gait is steady with normal steps, base, arm swing, and turning. Heel and toe walking are normal.  Mild difficulty with tandem walking   HISTORICAL  Donna Short is here with her father, following for seizure, recent hospital discharge September 16 of May 31, 2020, she was in nonconvulsive status epilepticus, was put into burst suppression, following her acute pancreatitis   Donna Short a 30 year old right-handed female, accompanied by her godfather Mr. Elige Radon, seen in refer by emergency room for evaluation of her only seizure in December 19 2015.  On April 14th 2017,She had acute onset generalized tonic-clonic seizure at work in [employer], she woke up in the ambulance, 3 hours later, while at emergency room, she had her second generalized tonic-clonic seizure, she has been treated with Keppra 500 mg twice a day, tolerating medication well.  CT scan of the brain in April 2017, no intra-cranial abnormality, right parietal skull abrasion,  Laboratory evaluation showed mild anemia, with hemoglobin 11.8, normal CMP, negative pregnancy test.  She denies focal symptoms, she was developmentally normal, denier history of febrile seizure, there was no family history of epilepsy.  She works at [employer] for [employer]  EEG was normal in May 27th 2017. She could not afford MRI brain.  She is now taking Keppra 500mg  bid, Noticed irritability, anxiety, also complains work-related anxiety, we switched her to lamotrigine from Keppra  She has lost follow-up since last visit in May 2017, return to clinic again on February 12, 2018 she was supposed to take lamotrigine 100 mg twice a day, but not compliant with her medications, she was admitted to the hospital in July 2019 for acute pancreatitis, related to alcohol use, hospital  course was also complicated by acute encephalopathy, possible alcohol withdrawal seizure, possible aspiration pneumonia, requiring intubation, during her hospital stay, there was reported seizure activity,  CT head without contrast on Jan 18, 2018 was normal.  Discharge laboratory on January 28, 2018 showed low potassium 3.3, normal creatinine 0.91,WBC remains elevated 12.9, anemia hemoglobin of 7.7, normal TSH, folic acid, B12 396,  CT abdomen on Jan 20, 2018 showed changes of acute pancreatitis, considerable peripancreatic fluid, and the free fluid in the abdomen, wall thickening within the colon, bilateral pleural effusion, lower lobe consolidation,  She n was complains of increased fatigue, with documented blood pressure 94/68, heart rate of 97  Blood pressure is 94/68, heart rate of 97, she was discharged with prescription of labetalol 200 mg twice a day, hemoglobin of only 9.9, she was switched back to Keppra 500 mg twice daily during her hospital stay  She again lost to follow-up since June 2019,  I reviewed her most recent hospital discharge September 16 through May 31, 2020, acute on chronic pancreatitis, she presented with intractable nausea vomiting after binge drinking, during her hospital stay, had seizure on May 13, 2020, despite higher dose of Keppra, she becomes confused, acute metabolic encephalopathy, alcohol abuse PRES, acute respiratory failure, intubated for burst suppression on September 20, vocal cord swallowing, reintubation September 30, treated with dexamethasone, eventually require tracheostomy on October 4, staphylococcal tracheal aspiration, spiked fever of 105, patient had no recollection  of the event, woke up 3 weeks later at the hospital,  I personally reviewed MRI of the brain September 20 and 24, 2021, patchy cortical/subcortical T2/flair hyperintensity involving bilateral frontal, parietal, temporal, occipital region, right worse than left frontal region,  and the left temporal occipital region, mild patchy involvement of cerebellum  She was seen by epileptologist Dr. Melynda Ripple, continues EEG monitoring  showed epileptogenic activity arising from the left posterior temporal region, cortical dysfunction in the left hemisphere diffuse encephalopathy,  Reviewed multiple neuro hospitalist record, nonconvulsive status epilepticus in the setting of posterior reversible encephalopathy syndrome, she was put on burst suppression with propofol, Versed, multiple antiepileptic medications, Keppra, Dilantin, Onfi, phenobarbital  I have advised Dr. Artis Flock to gradually tapering off Dilantin, keep current dose of Keppra 1000 mg twice a day, phenobarbital 100 mg daily  She was discharged with Keppra 1500 mg 12 hours, phenobarbital 64.8 mg twice a day, Dilantin 75 mg every 8 hours  I discussed with her primary care physician Dr. Artis Flock, on July 12 2020, decided to gradually taper off Dilantin, keep current dose of Keppra 1000 mg twice a day, phenobarbital 100 mg daily  Laboratory evaluation July 12, 2020: Dilantin level was 20.9, phenobarbital 29.7, B12 more than 2000, LDL 156, CMP was normal, glucose was 63, hemoglobin 10.7,  Lipase was normal 24 in October 2021  She is now 80% back to her baseline, lives alone, was brought in by her godfather today, she complains of side effect from polypharmacy treatment, dizziness, sleepiness, is in the process of weaning off Dilantin,  UPDATE November 01 2020: She had sudden loss of consciousness on October 19, 2020 at her work, [employer], next couple days she felt dizzy, lack of appetite, stayed in bed, during the virtual visit on October 22, 2020 with her primary care Dr. Orland Mustard, she was noted to be confused, was sent to the emergency room the same day, she was noted to be confused, abnormal MRI of the brain detailed above, was connected with video EEG monitoring, which showed subclinical seizure,  was seen by Dr. Melynda Ripple, required continued titration of multiple and elective medications to resolve her nonconvulsive status epilepticus, was discharged home with polypharmacy, including Keppra 1500 mg twice a day, Dilantin 100 mg every 8 hours, phenobarbital 95 mg daily, Vimpat 100 mg twice a day, perampanel 4 mg daily   There was no clinical seizure since hospital discharge, no confusion, she complains of excessive fatigue, can only go to sleep after medications, no energy for anything else     REVIEW OF SYSTEMS: Full 14 system review of systems performed and notable only for as above All other review of systems were negative.  ALLERGIES: Allergies  Allergen Reactions  . Claritin [Loratadine] Itching  . Peanut-Containing Drug Products Other (See Comments)    Family not sure of reaction  . Benadryl [Diphenhydramine Hcl (Sleep)] Rash  . Latex Rash  . Shrimp [Shellfish Allergy] Hives and Rash    HOME MEDICATIONS: Current Outpatient Medications  Medication Sig Dispense Refill  . amLODipine (NORVASC) 5 MG tablet TAKE ONE TABLET BY MOUTH DAILY (Patient taking differently: Take 5 mg by mouth daily.) 90 tablet 0  . folic acid (FOLVITE) 1 MG tablet Take 1 tablet (1 mg total) by mouth daily. 90 tablet 1  . Lacosamide 100 MG TABS Take 1 tablet (100 mg total) by mouth 2 (two) times daily. 60 tablet 0  . levETIRAcetam (KEPPRA) 750 MG tablet Take 2 tablets (1,500 mg total)  by mouth 2 (two) times daily. 120 tablet 1  . metoprolol tartrate (LOPRESSOR) 50 MG tablet Take 0.5 tablets (25 mg total) by mouth 2 (two) times daily. 180 tablet 1  . Multiple Vitamin (MULTIVITAMIN WITH MINERALS) TABS tablet Place 1 tablet into feeding tube daily. (Patient taking differently: Take 1 tablet by mouth daily.)    . perampanel 4 MG TABS Take 1 tablet (4 mg total) by mouth daily. 30 tablet 1  . PHENobarbital (LUMINAL) 97.2 MG tablet Take 1 tablet (97.2 mg total) by mouth daily. 30 tablet 1  . phenytoin (DILANTIN) 50  MG tablet Chew 2 tablets (100 mg total) by mouth 3 (three) times daily. 180 tablet 1  . sertraline (ZOLOFT) 100 MG tablet Take 1 tablet (100 mg total) by mouth daily. 90 tablet 1   No current facility-administered medications for this visit.    PAST MEDICAL HISTORY: Past Medical History:  Diagnosis Date  . Asthma   . Depression   . History of chicken pox   . Hypertension   . Migraines   . Pancreatitis 01/2018  . Seizures (HCC)     PAST SURGICAL HISTORY: Past Surgical History:  Procedure Laterality Date  . WISDOM TOOTH EXTRACTION      FAMILY HISTORY: Family History  Problem Relation Age of Onset  . Diabetes Father   . Arthritis Father   . Alcohol abuse Father   . Breast cancer Mother   . Asthma Mother   . Drug abuse Mother   . Hypertension Mother     SOCIAL HISTORY: Social History   Socioeconomic History  . Marital status: Single    Spouse name: Not on file  . Number of children: 0  . Years of education: 2 years college  . Highest education level: Not on file  Occupational History  . Occupation: Deli at [employer]  Tobacco Use  . Smoking status: Former Smoker    Types: Cigarettes    Quit date: 01/21/2018    Years since quitting: 2.7  . Smokeless tobacco: Never Used  Vaping Use  . Vaping Use: Never used  Substance and Sexual Activity  . Alcohol use: Yes    Alcohol/week: 9.0 standard drinks    Types: 9 Shots of liquor per week    Comment: 02/2019 "  2 shots every 3 or 4 days "  . Drug use: No  . Sexual activity: Not Currently  Other Topics Concern  . Not on file  Social History Narrative   Lives at home alone.   Right-handed.   No caffeine use.   Social Determinants of Health   Financial Resource Strain: Not on file  Food Insecurity: Not on file  Transportation Needs: Not on file  Physical Activity: Not on file  Stress: Not on file  Social Connections: Not on file  Intimate Partner Violence: Not on file    Total time spent reviewing the  chart, obtaining history, examined patient, ordering tests, documentation, consultations and family, care coordination was 4545 mintues    Levert FeinsteinYijun Bambie Pizzolato, M.D. Ph.D.  [facility] [address], [address] [place], [place] [address] Ph: [phone] Fax: [phone]  CC:  Orland MustardWolfe, Allison, MD [address] [place],  [place] [id number]  Orland MustardWolfe, Allison, MD

## 2020-11-01 NOTE — Telephone Encounter (Signed)
Sent application to [facility] P[facility] as Urgent to with Services . Sent via fax gave patient copy of conformation with telephone for follow up.   Gave Patient resources services  [employer]  And social services

## 2020-11-18 LAB — CBC WITH DIFFERENTIAL
Basophils Absolute: 0 10*3/uL (ref 0.0–0.2)
Basos: 1 %
EOS (ABSOLUTE): 0.1 10*3/uL (ref 0.0–0.4)
Eos: 1 %
Hematocrit: 34.3 % (ref 34.0–46.6)
Hemoglobin: 11.9 g/dL (ref 11.1–15.9)
Immature Grans (Abs): 0.1 10*3/uL (ref 0.0–0.1)
Immature Granulocytes: 1 %
Lymphocytes Absolute: 2.2 10*3/uL (ref 0.7–3.1)
Lymphs: 38 %
MCH: 34.1 pg — ABNORMAL HIGH (ref 26.6–33.0)
MCHC: 34.7 g/dL (ref 31.5–35.7)
MCV: 98 fL — ABNORMAL HIGH (ref 79–97)
Monocytes Absolute: 0.5 10*3/uL (ref 0.1–0.9)
Monocytes: 9 %
Neutrophils Absolute: 2.8 10*3/uL (ref 1.4–7.0)
Neutrophils: 50 %
RBC: 3.49 x10E6/uL — ABNORMAL LOW (ref 3.77–5.28)
RDW: 12.8 % (ref 11.7–15.4)
WBC: 5.6 10*3/uL (ref 3.4–10.8)

## 2020-11-18 LAB — LEVETIRACETAM LEVEL: Levetiracetam Lvl: 34.7 ug/mL (ref 10.0–40.0)

## 2020-11-18 LAB — COMPREHENSIVE METABOLIC PANEL
ALT: 23 IU/L (ref 0–32)
AST: 31 IU/L (ref 0–40)
Albumin/Globulin Ratio: 1.3 (ref 1.2–2.2)
Albumin: 4.4 g/dL (ref 3.8–4.8)
Alkaline Phosphatase: 47 IU/L (ref 44–121)
BUN/Creatinine Ratio: 17 (ref 9–23)
BUN: 14 mg/dL (ref 6–20)
Bilirubin Total: <0.2 mg/dL (ref 0.0–1.2)
CO2: 18 mmol/L — ABNORMAL LOW (ref 20–29)
Calcium: 10.1 mg/dL (ref 8.7–10.2)
Chloride: 101 mmol/L (ref 96–106)
Creatinine, Ser: 0.84 mg/dL (ref 0.57–1.00)
Globulin, Total: 3.4 g/dL (ref 1.5–4.5)
Glucose: 112 mg/dL — ABNORMAL HIGH (ref 65–99)
Potassium: 5.6 mmol/L — ABNORMAL HIGH (ref 3.5–5.2)
Sodium: 141 mmol/L (ref 134–144)
Total Protein: 7.8 g/dL (ref 6.0–8.5)
eGFR: 95 mL/min/{1.73_m2} (ref >59)

## 2020-11-18 LAB — LACOSAMIDE: Lacosamide: 7.2 ug/mL (ref 5.0–10.0)

## 2020-11-18 LAB — PHENYTOIN LEVEL, TOTAL: Phenytoin (Dilantin), Serum: 1.9 ug/mL — ABNORMAL LOW (ref 10.0–20.0)

## 2020-11-18 LAB — PHENOBARBITAL LEVEL: Phenobarbital, Serum: 34 ug/mL (ref 15–40)

## 2020-11-19 ENCOUNTER — Telehealth: Payer: Self-pay | Admitting: Neurology

## 2020-11-19 NOTE — Telephone Encounter (Signed)
Please call patient, laboratory evaluation showed  Lacosamide level 7.2, within target Phenobarbital level 34, within target Dilantin level was decreased 1.9 Keppra level 34.7, within target   Please check the antiepileptic medication she is taking now, if she still has recurrent seizure, does she have any side effects from the medications?  Let her bring all the medication bottles at next visit

## 2020-11-19 NOTE — Telephone Encounter (Signed)
I spoke to the patient. She confirmed the following meds:  1) lacosamide 100mg , one tab BID 2) phenobarbital 97.2mg , one tab QD 3) levetiracetam 766m, two tabs BID  She is not taking the Dilantin (unaffordable).  She has not had any seizures since her last appt on 11/01/20.   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   Pending appt on 11/22/20.

## 2020-11-22 ENCOUNTER — Other Ambulatory Visit: Payer: Self-pay | Admitting: *Deleted

## 2020-11-22 ENCOUNTER — Ambulatory Visit (INDEPENDENT_AMBULATORY_CARE_PROVIDER_SITE_OTHER): Payer: Managed Care, Other (non HMO) | Admitting: Neurology

## 2020-11-22 ENCOUNTER — Encounter: Payer: Self-pay | Admitting: Neurology

## 2020-11-22 VITALS — BP 117/68 | HR 99 | Ht 62.0 in | Wt 126.5 lb

## 2020-11-22 DIAGNOSIS — G40909 Epilepsy, unspecified, not intractable, without status epilepticus: Secondary | ICD-10-CM

## 2020-11-22 DIAGNOSIS — R9089 Other abnormal findings on diagnostic imaging of central nervous system: Secondary | ICD-10-CM | POA: Diagnosis not present

## 2020-11-22 MED ORDER — LAMOTRIGINE 25 MG PO TABS: 25.0000 mg | ORAL_TABLET | Freq: Every day | ORAL | 30 refills | Status: DC

## 2020-11-22 MED ORDER — LAMOTRIGINE 100 MG PO TABS: 200.0000 mg | ORAL_TABLET | Freq: Two times a day (BID) | ORAL | 6 refills | Status: DC

## 2020-11-22 MED ORDER — LAMOTRIGINE 25 MG PO TABS: ORAL_TABLET | ORAL | 0 refills | Status: DC

## 2020-11-22 NOTE — Patient Instructions (Signed)
Lacosamide 100 mg  Tablet, you only have 7 tablets left, cut 1 tablets in 1/2,    weeks Lacosamide 100mg  AM/PM Lamotrigine 25mg   1st 1/2 and 1 1/1  2nd 1/2 and 1 2/2  3rd 1/2 and 1/2 3/3  4th 1/2 and 1/2 4/4  5th 1/2 and 1/1 Lamotrigine 100mg  2 tabs twice a day    Keep levetiracetam 750 mg 1 tablet twice a day Keep phenobarbital 97.2 mg 1 tablet every night

## 2020-11-22 NOTE — Progress Notes (Signed)
Chief Complaint  Patient presents with  . Follow-up    She is here with her godfather, Donna Short. No further seizure activity since last seen.    ASSESSMENT AND PLAN  Donna Short is a 30 y.o. 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  Video EEG monitoring March 1 to 5 2022.  This study initially showed frequent seizures (average 2 to 3/h) which gradually improved after adjusting AEDs. Total 12 seizures were noted between 1130 on 10/23/2020  to 10/24/2020 0924. Patient was noted to be confused during the seizures. Seizures are arising from left posterior quadrant, last seizure on 10/24/2020 at 0858, average duration of seizure about 3 minutes.  EEG also showed evidence of epileptogenicity arising from left posterior quadrant due to underlying structural abnormality.  Donna Short   I personally reviewed MRI of the brain with without contrast on October 22, 2020, in comparison with previous MRI, small region of cortical/subcortical T2/flair hyperintensity within the left parietal and occipital lobes, this findings were not present at previous MRI of October 25, 2019, primary consideration in this patient including early signal changes related to hypertensive encephalopathy/press versus subtle chronic encephalomalacia from previous episode, stable chronic  microhemorrhage within the right frontal and parietal lobe, mild cerebral and cerebellar atrophy, advanced for her age  Laboratory evaluation in March 2022, lacosamide level 7.2, phenobarbital 34, Dilantin 1.9, Keppra 35  PHYSICAL EXAMNIATION: Blood pressure 102/69, heart rate of 101, weight 120, height 5 2,  Gen: NAD, conversant, well nourised, well groomed                     Cardiovascular: Regular rate rhythm, no peripheral edema, warm, nontender. Eyes: Conjunctivae clear without exudates or hemorrhage Neck: Supple, no carotid bruits. Pulmonary: Clear to auscultation bilaterally   NEUROLOGICAL EXAM:  MENTAL STATUS: Speech/cognition: Awake alert oriented to history taking care of conversation   CRANIAL NERVES: CN II: Visual fields are full to confrontation. Pupils are round equal and briskly reactive to light. CN III, IV, VI: extraocular movement are normal. No ptosis. CN V: Facial sensation is intact to light touch CN VII: Face is symmetric with normal eye closure  CN VIII: Hearing is normal to causal conversation. CN IX, X: Phonation is normal. CN XI: Head turning and shoulder shrug are intact  MOTOR: There is no pronator drift of out-stretched arms. Muscle bulk and tone are normal. Muscle strength is normal.  REFLEXES: Reflexes are 2+ and symmetric at the biceps, triceps, knees, and ankles. Plantar responses are flexor.  SENSORY: Intact to light touch, pinprick and vibratory sensation are intact in fingers and toes.  COORDINATION: There is no trunk or limb dysmetria noted.  GAIT/STANCE: Posture is normal. Gait is steady with normal steps, base, arm swing, and turning. Heel and toe walking are normal.  Mild difficulty with tandem walking   HISTORICAL  Donna Short is here  with her father, following for seizure, recent hospital discharge September 16 of May 31, 2020, she was in nonconvulsive status epilepticus, was put into burst suppression, following her  acute pancreatitis  I saw her initially for seizure on December 19 2015.  On April 14th 2017,She had acute onset generalized tonic-clonic seizure at work in [employer], she woke up in the ambulance, 3 hours later, while at emergency room, she had her second generalized tonic-clonic seizure, she has been treated with Keppra 500 mg twice a day, tolerating medication well.  CT scan of the brain in April 2017, no intra-cranial abnormality, right parietal skull abrasion,  Laboratory evaluation showed mild anemia, with hemoglobin 11.8, normal CMP, negative pregnancy test.  She denies focal symptoms, she was developmentally normal, denier history of febrile seizure, there was no family history of epilepsy.  She works at [employer] for [employer]  EEG was normal in May 27th 2017. She could not afford MRI brain.  While taking Keppra 500mg  bid, Noticed irritability, anxiety, also complains work-related anxiety, we switched her to lamotrigine from Keppra  She has lost follow-up since last visit in May 2017, return to clinic again on February 12, 2018 she was supposed to take lamotrigine 100 mg twice a day, but not compliant with her medications, she was admitted to the hospital in July 2019 for acute pancreatitis, related to alcohol use, hospital course was also complicated by acute encephalopathy, possible alcohol withdrawal seizure, possible aspiration pneumonia, requiring intubation, during her hospital stay, there was reported seizure activity,  CT head without contrast on Jan 18, 2018 was normal.  Discharge laboratory on January 28, 2018 showed low potassium 3.3, normal creatinine 0.91,WBC remains elevated 12.9, anemia hemoglobin of 7.7, normal TSH, folic acid, B12 396,  CT abdomen on Jan 20, 2018 showed changes of acute pancreatitis, considerable peripancreatic fluid, and the free fluid in the abdomen, wall thickening within the colon, bilateral pleural effusion, lower lobe  consolidation,  She  complains of increased fatigue, with documented blood pressure 94/68, heart rate of 97, she was discharged with prescription of labetalol 200 mg twice a day, hemoglobin of only 9.9, she was switched back to Keppra 500 mg twice daily during her hospital stay  She again lost to follow-up since June 2019,  I reviewed her most recent hospital discharge September 16 through May 31, 2020, acute on chronic pancreatitis, she presented with intractable nausea vomiting after binge drinking, during her hospital stay, had seizure on May 13, 2020, despite higher dose of Keppra, she becomes confused, acute metabolic encephalopathy, alcohol abuse PRES, acute respiratory failure, intubated for burst suppression on September 20, vocal cord swallowing, reintubation September 30, treated with dexamethasone, eventually require tracheostomy on October 4, staphylococcal tracheal aspiration, spiked fever of 105, patient had no recollection of the event, woke up 3 weeks later at the hospital,  I personally reviewed MRI of the brain September 20 and 24, 2021, patchy cortical/subcortical T2/flair hyperintensity involving bilateral frontal, parietal, temporal, occipital region, right worse than left frontal region, and the left temporal occipital region, mild patchy involvement of cerebellum  She was seen by epileptologist Dr. 2022, continues EEG monitoring  showed epileptogenic activity arising from the left posterior temporal region, cortical dysfunction in the left hemisphere diffuse encephalopathy,  Reviewed multiple neuro hospitalist record, nonconvulsive status epilepticus in the setting of posterior reversible encephalopathy syndrome, she was put on burst suppression with propofol, Versed, multiple antiepileptic medications, Keppra, Dilantin, Onfi, phenobarbital  I have advised Dr. Melynda Ripple to  gradually tapering off Dilantin, keep current dose of Keppra 1000 mg twice a day, phenobarbital 100  mg daily  She was discharged with Keppra 1500 mg 12 hours, phenobarbital 64.8 mg twice a day, Dilantin 75 mg every 8 hours  I discussed with her primary care physician Dr. Artis FlockWolfe, on July 12 2020, decided to gradually taper off Dilantin, keep current dose of Keppra 1000 mg twice a day, phenobarbital 100 mg daily  Laboratory evaluation July 12, 2020: Dilantin level was 20.9, phenobarbital 29.7, B12 more than 2000, LDL 156, CMP was normal, glucose was 63, hemoglobin 10.7,  Lipase was normal 24 in October 2021  She is now 80% back to her baseline, lives alone, was brought in by her godfather today, she complains of side effect from polypharmacy treatment, dizziness, sleepiness, is in the process of weaning off Dilantin,  UPDATE November 01 2020: She had sudden loss of consciousness on October 19, 2020 at her work, [employer], next couple days she felt dizzy, lack of appetite, stayed in bed, during the virtual visit on October 22, 2020 with her primary care Dr. Orland MustardAllison Wolfe, she was noted to be confused, was sent to the emergency room the same day, she was noted to be confused, abnormal MRI of the brain detailed above, was connected with video EEG monitoring, which showed subclinical seizure, was seen by Dr. Melynda RippleYadav, required continued titration of multiple and elective medications to resolve her nonconvulsive status epilepticus, was discharged home with polypharmacy, including Keppra 1500 mg twice a day, Dilantin 100 mg every 8 hours, phenobarbital 95 mg daily, Vimpat 100 mg twice a day, perampanel 4 mg daily  There was no clinical seizure since hospital discharge, no confusion, she complains of excessive fatigue, can only go to sleep after medications, no energy for anything else  UPDATE November 22 2020: She is doing well, no recurrent seizure, she is now on Vimpat 100 mg twice a day, Keppra 750 mg twice a day, phenobarbital 97.2 mg daily  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  ALLERGIES: Allergies  Allergen Reactions  . Claritin [Loratadine] Itching  . Peanut-Containing Drug Products Other (See Comments)    Family not sure of reaction  . Benadryl [Diphenhydramine Hcl (Sleep)] Rash  . Latex Rash  . Shrimp [Shellfish Allergy] Hives and Rash    HOME MEDICATIONS: Current Outpatient Medications  Medication Sig Dispense Refill  . amLODipine (NORVASC) 5 MG tablet TAKE ONE TABLET BY MOUTH DAILY (Patient taking differently: Take 5 mg by mouth daily.) 90 tablet 0  . folic acid (FOLVITE) 1 MG tablet Take 1 tablet (1 mg total) by mouth daily. 90 tablet 1  . Lacosamide 100 MG TABS Take 1 tablet (100 mg total) by mouth 2 (two) times daily. 60 tablet 5  . levETIRAcetam (KEPPRA) 750 MG tablet Take 2 tablets (1,500 mg total) by mouth 2 (two) times daily. 120 tablet 11  . Multiple Vitamin (MULTIVITAMIN WITH MINERALS) TABS tablet Place 1 tablet into feeding tube daily. (Patient taking differently: Take 1 tablet by mouth daily.)    . PHENobarbital (LUMINAL) 97.2 MG tablet Take 1 tablet (97.2 mg total) by mouth daily. 30 tablet 5  . sertraline (ZOLOFT) 100 MG tablet Take 1 tablet (  100 mg total) by mouth daily. 90 tablet 1   No current facility-administered medications for this visit.    PAST MEDICAL HISTORY: Past Medical History:  Diagnosis Date  . Asthma   . Depression   . History of chicken pox   . Hypertension   . Migraines   . Pancreatitis 01/2018  . Seizures (HCC)     PAST SURGICAL HISTORY: Past Surgical History:  Procedure Laterality Date  . WISDOM TOOTH EXTRACTION      FAMILY HISTORY: Family History  Problem Relation Age of Onset  . Diabetes Father   . Arthritis Father   . Alcohol abuse Father    . Breast cancer Mother   . Asthma Mother   . Drug abuse Mother   . Hypertension Mother     SOCIAL HISTORY: Social History   Socioeconomic History  . Marital status: Single    Spouse name: Not on file  . Number of children: 0  . Years of education: 2 years college  . Highest education level: Not on file  Occupational History  . Occupation: Deli at [employer]  . Smoking status: Former Smoker    Types: Cigarettes    Quit date: 01/21/2018    Years since quitting: 2.8  . Smokeless tobacco: Never Used  Vaping Use  . Vaping Use: Never used  Substance and Sexual Activity  . Alcohol use: Yes    Alcohol/week: 9.0 standard drinks    Types: 9 Shots of liquor per week    Comment: 02/2019 "  2 shots every 3 or 4 days "  . Drug use: No  . Sexual activity: Not Currently  Other Topics Concern  . Not on file  Social History Narrative   Lives at home alone.   Right-handed.   No caffeine use.   Social Determinants of Health   Financial Resource Strain: Not on file  Food Insecurity: Not on file  Transportation Needs: Not on file  Physical Activity: Not on file  Stress: Not on file  Social Connections: Not on file  Intimate Partner Violence: Not on file    Total time spent reviewing the chart, obtaining history, examined patient, ordering tests, documentation, consultations and family, care coordination was 41 mintues    Levert Feinstein, M.D. Ph.D.  [facility] [address] [place], [place] [address] Ph: [phone] Fax: [phone]  CC:  Orland Mustard, MD [address] [place],  [place] [address]  Orland Mustard, MD

## 2020-11-26 ENCOUNTER — Telehealth: Payer: Self-pay | Admitting: Neurology

## 2020-11-26 NOTE — Telephone Encounter (Signed)
Pt states re: the paperwork completed by Dr Terrace Arabia for pt to return to work the limitations Dr Terrace Arabia has put her on will prevent her from being able to do her job.  Pt is asking for a call to discuss the way the paperwork was completed.

## 2020-11-26 NOTE — Telephone Encounter (Signed)
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 Dr. 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 The patient says there is an auto shut off on the machine and they are ordering her cutting gloves made to fit her hands.   Dr. Terrace Arabia has signed this update on her ppw and it will be faxed back to [facility].

## 2020-12-07 ENCOUNTER — Other Ambulatory Visit: Payer: Self-pay

## 2020-12-07 ENCOUNTER — Encounter (HOSPITAL_COMMUNITY): Payer: Self-pay | Admitting: Emergency Medicine

## 2020-12-07 ENCOUNTER — Emergency Department (HOSPITAL_COMMUNITY)
Admission: EM | Admit: 2020-12-07 | Discharge: 2020-12-07 | Disposition: A | Discharge status: Home / Self Care | Payer: Managed Care, Other (non HMO) | Attending: Emergency Medicine | Admitting: Emergency Medicine

## 2020-12-07 DIAGNOSIS — Z91040 Latex allergy status: Secondary | ICD-10-CM | POA: Diagnosis not present

## 2020-12-07 DIAGNOSIS — Z23 Encounter for immunization: Secondary | ICD-10-CM | POA: Diagnosis not present

## 2020-12-07 DIAGNOSIS — Z91010 Allergy to peanuts: Secondary | ICD-10-CM | POA: Diagnosis not present

## 2020-12-07 DIAGNOSIS — Z79899 Other long term (current) drug therapy: Secondary | ICD-10-CM | POA: Diagnosis not present

## 2020-12-07 DIAGNOSIS — S61211A Laceration without foreign body of left index finger without damage to nail, initial encounter: Secondary | ICD-10-CM | POA: Diagnosis not present

## 2020-12-07 DIAGNOSIS — Z87891 Personal history of nicotine dependence: Secondary | ICD-10-CM | POA: Diagnosis not present

## 2020-12-07 DIAGNOSIS — W290XXA Contact with powered kitchen appliance, initial encounter: Secondary | ICD-10-CM | POA: Diagnosis not present

## 2020-12-07 DIAGNOSIS — S6992XA Unspecified injury of left wrist, hand and finger(s), initial encounter: Secondary | ICD-10-CM | POA: Diagnosis present

## 2020-12-07 DIAGNOSIS — I10 Essential (primary) hypertension: Secondary | ICD-10-CM | POA: Diagnosis not present

## 2020-12-07 DIAGNOSIS — Y990 Civilian activity done for income or pay: Secondary | ICD-10-CM | POA: Insufficient documentation

## 2020-12-07 DIAGNOSIS — J45909 Unspecified asthma, uncomplicated: Secondary | ICD-10-CM | POA: Diagnosis not present

## 2020-12-07 MED ORDER — TETANUS-DIPHTH-ACELL PERTUSSIS 5-2.5-18.5 LF-MCG/0.5 IM SUSY
0.5000 mL | PREFILLED_SYRINGE | Freq: Once | INTRAMUSCULAR | Status: AC
  Administered 2020-12-07: 0.5 mL via INTRAMUSCULAR
  Filled 2020-12-07: qty 0.5

## 2020-12-07 NOTE — ED Provider Notes (Signed)
[facility] EMERGENCY DEPARTMENT Provider Note   CSN: [id number] Arrival date & time: 12/07/20  1958     History No chief complaint on file.   Donna Short is a 30 y.o. female.  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 Pt reports laceration on right index finger.  Pt reports area has continued to bleed.   The history is provided by the patient. No language interpreter was used.       Past Medical History:  Diagnosis Date  . Asthma   . Depression   . History of chicken pox   . Hypertension   . Migraines   . Pancreatitis 01/2018  . Seizures [facility])     Patient Active Problem List   Diagnosis Date Noted  . Nonintractable epilepsy without status epilepticus (HCC) 11/22/2020  . Abnormal finding on MRI of brain 11/01/2020  . Seizure (HCC) 10/22/2020  . PRES (posterior reversible encephalopathy syndrome) 07/18/2020  . Acute on chronic respiratory failure with hypoxia (HCC) 06/04/2020  . Acute metabolic encephalopathy 06/04/2020  . Status post tracheostomy (HCC)   . 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 Pancreatitis, acute 05/11/2020  . AKI (acute kidney injury) (HCC) 05/10/2020  . Depression, recurrent (HCC) 12/26/2019  . Acute pancreatitis 11/17/2019  . Acute on chronic pancreatitis (HCC) 02/24/2019  . Uncomplicated asthma   . Benign essential HTN   . Tachycardia   . ETOH abuse   . Respiratory failure (HCC)   . Encephalopathy acute   . Seizures (HCC) 12/19/2015    Past Surgical History:  Procedure Laterality Date  . WISDOM TOOTH EXTRACTION       OB History   No obstetric history on file.     Family History  Problem Relation Age of Onset  . Diabetes Father   . Arthritis Father   . Alcohol abuse Father   . Breast cancer Mother   . Asthma Mother   . Drug abuse Mother   . Hypertension Mother     Social History   Tobacco Use  . Smoking status: Former Smoker    Types: Cigarettes     Quit date: 01/21/2018    Years since quitting: 2.8  . Smokeless tobacco: Never Used  Vaping Use  . Vaping Use: Never used  Substance Use Topics  . Alcohol use: Yes    Alcohol/week: 9.0 standard drinks    Types: 9 Shots of liquor per week    Comment: 02/2019 "  2 shots every 3 or 4 days "  . Drug use: No    Home Medications Prior to Admission medications   Medication Sig Start Date End Date Taking? Authorizing Provider  amLODipine (NORVASC) 5 MG tablet TAKE ONE TABLET BY MOUTH DAILY Patient taking differently: Take 5 mg by mouth daily. 10/21/20   Orland Mustard, MD  folic acid (FOLVITE) 1 MG tablet Take 1 tablet (1 mg total) by mouth daily. 06/27/20   Orland Mustard, MD  Lacosamide 100 MG TABS Take 1 tablet (100 mg total) by mouth 2 (two) times daily. 11/01/20   Levert Feinstein, MD  Lacosamide 100 MG TABS TAKE 1 TABLET (100 MG TOTAL) BY MOUTH TWO TIMES DAILY. 10/26/20 04/24/21  Leatha Gilding, MD  lamoTRIgine (LAMICTAL) 100 MG tablet Take 2 tablets (200 mg total) by mouth 2 (two) times daily. 11/22/20   Levert Feinstein, MD  lamoTRIgine (LAMICTAL) 25 MG tablet Take one tab BID x 1 week, then two tabs  BID x 1 week, then three tabs BID x 1 week, then 4 tabs BID x 1 week, then start 100mg  tablets, two tabs BID (separate rx sent to pharmacy). 11/22/20   11/24/20, MD  levETIRAcetam (KEPPRA) 750 MG tablet Take 750 mg by mouth 2 (two) times daily.    [provider]  Multiple Vitamin (MULTIVITAMIN WITH MINERALS) TABS tablet Place 1 tablet into feeding tube daily. Patient taking differently: Take 1 tablet by mouth daily. 06/01/20   08/01/20, NP  PHENobarbital (LUMINAL) 97.2 MG tablet Take 1 tablet (97.2 mg total) by mouth daily. 11/01/20   01/01/21, MD  sertraline (ZOLOFT) 100 MG tablet Take 1 tablet (100 mg total) by mouth daily. 07/12/20   07/14/20, MD    Allergies    Claritin [loratadine], Peanut-containing drug products, Benadryl [diphenhydramine hcl (sleep)], Latex, and Shrimp  [shellfish allergy]  Review of Systems   Review of Systems  Skin: Positive for wound.  All other systems reviewed and are negative.   Physical Exam Updated Vital Signs BP (!) 137/106 (BP Location: Left Arm)   Pulse (!) 107   Temp 99.2 F (37.3 C) (Oral)   Resp 16   Ht 5\' 2"  (1.575 m)   Wt 62 kg   SpO2 100%   BMI 25.00 kg/m   Physical Exam Vitals reviewed.  Musculoskeletal:        General: Normal range of motion.  Skin:    Comments: 67mm laceration distal tip,  From finger, nv and ns intact  No active bleeding   Neurological:     General: No focal deficit present.     Mental Status: She is alert.  Psychiatric:        Mood and Affect: Mood normal.     ED Results / Procedures / Treatments   Labs (all labs ordered are listed, but only abnormal results are displayed) Labs Reviewed - No data to display  EKG None  Radiology No results found.  Procedures Procedures   Medications Ordered in ED Medications  Tdap (BOOSTRIX) injection 0.5 mL (has no administration in time range)    ED Course  I have reviewed the triage vital signs and the nursing notes.  Pertinent labs & imaging results that were available during my care of the patient were reviewed by me and considered in my medical decision making (see chart for details).    MDM Rules/Calculators/A&P                          MDM:  Pt advised to leave xeroform on for 48 hours. Pt given tetanus here  Final Clinical Impression(s) / ED Diagnoses Final diagnoses:  Laceration of left index finger, foreign body presence unspecified, nail damage status unspecified, initial encounter    Rx / DC Orders ED Discharge Orders    None    An After Visit Summary was printed and given to the patient.    12/07/20 2110    12/09/20, MD 12/09/20 [phone]

## 2020-12-07 NOTE — Discharge Instructions (Signed)
Return if any problems.  Leave inside bandage on for 48 hours,

## 2020-12-07 NOTE — ED Triage Notes (Signed)
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

## 2020-12-19 ENCOUNTER — Other Ambulatory Visit: Payer: Self-pay | Admitting: Neurology

## 2020-12-25 ENCOUNTER — Telehealth: Payer: Self-pay | Admitting: Neurology

## 2020-12-25 DIAGNOSIS — G40909 Epilepsy, unspecified, not intractable, without status epilepticus: Secondary | ICD-10-CM

## 2020-12-25 NOTE — Telephone Encounter (Signed)
Left message for a return call

## 2020-12-25 NOTE — Telephone Encounter (Signed)
Pt is asking for a call from [facility] to discuss all of her medications.

## 2020-12-26 ENCOUNTER — Other Ambulatory Visit: Payer: Self-pay | Admitting: *Deleted

## 2020-12-26 DIAGNOSIS — Z79899 Other long term (current) drug therapy: Secondary | ICD-10-CM

## 2020-12-26 DIAGNOSIS — R569 Unspecified convulsions: Secondary | ICD-10-CM

## 2020-12-26 MED ORDER — PHENOBARBITAL 30 MG PO TABS: ORAL_TABLET | ORAL | 0 refills | Status: DC

## 2020-12-26 NOTE — Telephone Encounter (Addendum)
Per vo by Dr. Terrace Arabia, she should continue taking the following:  1) lamotrigine (Lamictal) 100mg , two tablets BID 2) levetiracetam (Keppra) 750mg , one tablet BID ___________________________________________  She is going to have her taper off phenobarbital. She is currently taking 97.2mg  at bedtime. Dr. has sent a new prescription to the pharmacy for 30mg  tablets. She should taper as follows:  1) phenobarbital 30mg , take two tabs QHS x 1 week, then one tab QHS x 1 week, then 0.5 tab QHS x 1 week, then stop.  She will only remain on lamotrigine and levetiracetam.  Also, she needs to come in for a lamotrigine level (orders already placed in Epic). ___________________________________________  I called the patient and had a detail, extended discussion about this plan. I ask her to write down the changes and she read them back to me correctly.  Additionally, I have called and left this update on the physician's line at . I instructed them to void all phenobarbital 97.2mg  refills in order to avoid confusion.

## 2020-12-26 NOTE — Addendum Note (Signed)
Addended by: Lilla Shook on: 12/26/2020 08:34 AM   Modules accepted: Orders

## 2020-12-26 NOTE — Telephone Encounter (Signed)
I spoke to the patient. She just completed her lamotrigine titration and started taking 100mg , 2 tablets twice daily. She has been on this dose for two days. She has been experiencing nausea, significant dizziness and fatigue. She did not have these symptoms while taking 100mg  BID. She has not started any other new medications. Symptoms have been severe enough that she has not been able to work.  She has also continued taking phenobarbital 97.2mg , one tab QHS and levetiracetam 750mg , one tab BID.

## 2020-12-26 NOTE — Telephone Encounter (Signed)
Transferred to Michelle.

## 2020-12-26 NOTE — Addendum Note (Signed)
Addended by: Levert Feinstein on: 12/26/2020 12:51 PM   Modules accepted: Orders

## 2020-12-26 NOTE — Addendum Note (Signed)
Addended by: Lindell Spar C on: 12/26/2020 11:48 AM   Modules accepted: Orders

## 2021-01-19 ENCOUNTER — Other Ambulatory Visit: Payer: Self-pay | Admitting: Family Medicine

## 2021-02-21 ENCOUNTER — Other Ambulatory Visit: Payer: Self-pay

## 2021-02-21 ENCOUNTER — Ambulatory Visit (INDEPENDENT_AMBULATORY_CARE_PROVIDER_SITE_OTHER): Payer: Managed Care, Other (non HMO) | Admitting: Neurology

## 2021-02-21 ENCOUNTER — Encounter: Payer: Self-pay | Admitting: Neurology

## 2021-02-21 VITALS — BP 125/89 | HR 99 | Ht 62.0 in | Wt 114.0 lb

## 2021-02-21 DIAGNOSIS — R569 Unspecified convulsions: Secondary | ICD-10-CM

## 2021-02-21 DIAGNOSIS — Z79899 Other long term (current) drug therapy: Secondary | ICD-10-CM | POA: Diagnosis not present

## 2021-02-21 MED ORDER — LAMOTRIGINE 200 MG PO TABS: 200.0000 mg | ORAL_TABLET | Freq: Two times a day (BID) | ORAL | 4 refills | Status: DC

## 2021-02-21 MED ORDER — LEVETIRACETAM 500 MG PO TABS: 500.0000 mg | ORAL_TABLET | Freq: Two times a day (BID) | ORAL | 11 refills | Status: DC

## 2021-02-21 MED ORDER — SERTRALINE HCL 100 MG PO TABS: 100.0000 mg | ORAL_TABLET | Freq: Every day | ORAL | 3 refills | Status: DC

## 2021-02-21 NOTE — Patient Instructions (Addendum)
Meds ordered this encounter  Medications   levETIRAcetam (KEPPRA) 500 MG tablet--Changed from Keppra 750mg  twice a day    Sig: Take 1 tablet (500 mg total) by mouth 2 (two) times daily.    Dispense:  60 tablet    Refill:  11   lamoTRIgine (LAMICTAL) 200 MG tablet---Changed from 100mg  2 tabs twice a day    Sig: Take 1 tablet (200 mg total) by mouth 2 (two) times daily.    Dispense:  180 tablet    Refill:  4   sertraline (ZOLOFT) 100 MG tablet= Keep the same dosage.    Sig: Take 1 tablet (100 mg total) by mouth daily.    Dispense:  90 tablet    Refill:  3

## 2021-02-21 NOTE — Progress Notes (Signed)
Chief Complaint  Patient presents with   Follow-up    Room 16 - alone. Three month follow up for seizures. She has continued antiepileptic medications as prescribed. Denies any recent seizure activity.     ASSESSMENT AND PLAN  Donna Short is a 30 y.o. 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  Video EEG monitoring March 1 to 5 2022.  This study initially showed frequent seizures (average 2 to 3/h) which gradually improved after adjusting AEDs. Total 12 seizures were noted between 1130 on 10/23/2020  to 10/24/2020 0924. Patient was noted to be confused during the seizures. Seizures are arising from left posterior quadrant, last seizure on 10/24/2020 at 0858, average duration of seizure about 3 minutes.  EEG also showed evidence of epileptogenicity arising from left posterior quadrant due to underlying structural abnormality.   Donna Short    I personally reviewed MRI of the brain with without contrast on October 22, 2020, in comparison with previous MRI, small region of cortical/subcortical T2/flair hyperintensity within the left parietal and occipital lobes, this findings were not present at previous MRI of October 25, 2019, primary consideration in this patient including early signal changes related to hypertensive encephalopathy/press versus subtle chronic encephalomalacia from previous  episode, stable chronic microhemorrhage within the right frontal and parietal lobe, mild cerebral and cerebellar atrophy, advanced for her age  Laboratory evaluation in March 2022, lacosamide level 7.2, phenobarbital 34, Dilantin 1.9, Keppra 35   HISTORICAL  Donna Short is here with her father, following for seizure, recent hospital discharge September 16 of May 31, 2020, she was in nonconvulsive status epilepticus, was put into burst suppression, following her acute pancreatitis  I saw her initially for seizure on December 19 2015.   On April 14th 2017,She had acute onset generalized tonic-clonic seizure at work in [employer], she woke up in the ambulance, 3 hours later, while at emergency room, she had her second generalized tonic-clonic seizure, she has been treated with Keppra 500 mg twice a day, tolerating medication well.   CT scan of the brain in April 2017, no intra-cranial abnormality, right parietal skull abrasion,   Laboratory evaluation showed mild anemia, with hemoglobin 11.8, normal CMP, negative pregnancy test.   She denies focal symptoms, she was developmentally normal, denier history of febrile seizure, there was no family history of epilepsy.   She works at [employer] for [employer]   EEG was normal in May 27th 2017.  She could not afford MRI brain.  While taking Keppra 500mg  bid,  Noticed irritability, anxiety, also complains work-related anxiety, we switched her to lamotrigine from Keppra  She has lost follow-up since last visit in May 2017, return to clinic again on February 12, 2018 she was supposed to take lamotrigine 100 mg twice a day, but not compliant with her medications, she was admitted to the hospital in July 2019 for acute pancreatitis, related to alcohol  use, hospital course was also complicated by acute encephalopathy, possible alcohol withdrawal seizure, possible aspiration pneumonia, requiring intubation, during her hospital stay, there was  reported seizure activity,   CT head without contrast on Jan 18, 2018 was normal.   Discharge laboratory on January 28, 2018 showed low potassium 3.3, normal creatinine 0.91,WBC remains elevated 12.9, anemia hemoglobin of 7.7, normal TSH, folic acid, B12 396,   CT abdomen on Jan 20, 2018 showed changes of acute pancreatitis, considerable peripancreatic fluid, and the free fluid in the abdomen, wall thickening within the colon, bilateral pleural effusion, lower lobe consolidation,   She  complains of increased fatigue, with documented blood pressure 94/68, heart rate of 97, she was discharged with prescription of labetalol 200 mg twice a day, hemoglobin of only 9.9, she was switched back to Keppra 500 mg twice daily during her hospital stay  She again lost to follow-up since June 2019,  I reviewed her most recent hospital discharge September 16 through May 31, 2020, acute on chronic pancreatitis, she presented with intractable nausea vomiting after binge drinking, during her hospital stay, had seizure on May 13, 2020, despite higher dose of Keppra, she becomes confused, acute metabolic encephalopathy, alcohol abuse PRES, acute respiratory failure, intubated for burst suppression on September 20, vocal cord swallowing, reintubation September 30, treated with dexamethasone, eventually require tracheostomy on October 4, staphylococcal tracheal aspiration, spiked fever of 105, patient had no recollection of the event, woke up 3 weeks later at the hospital,  I personally reviewed MRI of the brain September 20 and 24, 2021, patchy cortical/subcortical T2/flair hyperintensity involving bilateral frontal, parietal, temporal, occipital region, right worse than left frontal region, and the left temporal occipital region, mild patchy involvement of cerebellum  She was seen by epileptologist Dr. 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 Artis Flock to gradually tapering off Dilantin, keep current dose of Keppra 1000 mg twice a day, phenobarbital 100 mg daily  She was discharged with Keppra 1500 mg 12 hours, phenobarbital 64.8 mg twice a day, Dilantin 75 mg every 8 hours  I discussed with her primary care physician Dr. Artis Flock, on July 12 2020, decided to gradually taper off Dilantin, keep current dose of Keppra 1000 mg twice a day, phenobarbital 100 mg daily  Laboratory evaluation July 12, 2020: Dilantin level was 20.9, phenobarbital 29.7, B12 more than 2000, LDL 156, CMP was normal, glucose was 63, hemoglobin 10.7,  Lipase was normal 24 in October 2021  She is now 80% back to her baseline, lives alone, was brought in by her godfather today, she complains of side effect from polypharmacy treatment, dizziness, sleepiness, is in the process of weaning off Dilantin,  UPDATE November 01 2020: She had sudden loss of consciousness on October 19, 2020 at her work, [employer], next couple days she felt dizzy, lack of appetite, stayed in bed, during the virtual visit on October 22, 2020 with her primary care Dr. Orland Mustard, she was noted to be confused, was sent to the emergency room the same day, she was noted to be confused, abnormal MRI of the brain detailed above, was connected with video EEG monitoring, which showed subclinical seizure, was  seen by Dr. Melynda Ripple, required continued titration of multiple and elective medications to resolve her nonconvulsive status epilepticus, was discharged home with polypharmacy, including Keppra 1500 mg twice a day, Dilantin 100  mg every 8 hours, phenobarbital 95 mg daily, Vimpat 100 mg twice a day, perampanel 4 mg daily  There was no clinical seizure since hospital discharge, no confusion, she complains of excessive fatigue, can only go to sleep after medications, no energy for anything else  UPDATE November 22 2020: She is doing well, no recurrent seizure, she is now on Vimpat 100 mg twice a day, Keppra 750 mg twice a day, phenobarbital 97.2 mg daily  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 Eyes: Conjunctivae clear without exudates or hemorrhage Neck: Supple, no carotid bruits. Pulmonary: Clear to auscultation bilaterally   NEUROLOGICAL EXAM:  MENTAL STATUS: Speech/cognition: Awake alert oriented to history taking care of conversation   CRANIAL NERVES: CN II: Visual fields are full to confrontation. Pupils are round equal and briskly reactive to light. CN III, IV, VI: extraocular movement are normal. No ptosis. CN V: Facial sensation is intact to light touch CN VII: Face is symmetric with normal eye closure  CN VIII: Hearing is normal to causal conversation. CN IX, X: Phonation is normal. CN XI: Head turning and shoulder shrug are intact  MOTOR: There is no pronator drift of out-stretched arms. Muscle bulk and tone are normal. Muscle strength is normal.  REFLEXES: Reflexes are 2+ and symmetric at the biceps, triceps,  knees, and ankles. Plantar responses are flexor.  SENSORY: Intact to light touch, pinprick and vibratory sensation are intact in fingers and toes.  COORDINATION: There is no trunk or limb dysmetria noted.  GAIT/STANCE: Posture is normal.Gait is normal.  REVIEW OF SYSTEMS: Full 14 system review of systems performed and notable only for as above All other review of systems were negative.  ALLERGIES: Allergies  Allergen Reactions   Claritin [Loratadine] Itching   Peanut-Containing Drug Products Other (See Comments)    Family not sure of reaction   Benadryl [Diphenhydramine Hcl (Sleep)] Rash   Latex Rash   Shrimp [Shellfish Allergy] Hives and Rash    HOME MEDICATIONS: Current Outpatient Medications  Medication Sig Dispense Refill   amLODipine (NORVASC) 5 MG tablet TAKE ONE TABLET BY MOUTH DAILY 90 tablet 0   folic acid (FOLVITE) 1 MG tablet TAKE ONE TABLET BY MOUTH DAILY 90 tablet 1   lamoTRIgine (LAMICTAL) 100 MG tablet Take 2 tablets (200 mg total) by mouth 2 (two) times daily. 120 tablet 6   levETIRAcetam (KEPPRA) 750 MG tablet Take 750 mg by mouth 2 (two) times daily.     Multiple Vitamin (MULTIVITAMIN WITH MINERALS) TABS tablet Place 1 tablet into feeding tube daily. (Patient taking differently: Take 1 tablet by mouth daily.)     sertraline (ZOLOFT) 100  MG tablet Take 1 tablet (100 mg total) by mouth daily. 90 tablet 1   No current facility-administered medications for this visit.    PAST MEDICAL HISTORY: Past Medical History:  Diagnosis Date   Asthma    Depression    History of chicken pox    Hypertension    Migraines    Pancreatitis 01/2018   Seizures (HCC)     PAST SURGICAL HISTORY: Past Surgical History:  Procedure Laterality Date   WISDOM TOOTH EXTRACTION      FAMILY HISTORY: Family History  Problem Relation Age of Onset   Diabetes Father    Arthritis Father    Alcohol abuse Father    Breast cancer Mother    Asthma Mother    Drug abuse Mother     Hypertension Mother     SOCIAL HISTORY: Social History   Socioeconomic History   Marital status: Single    Spouse name: Not on file   Number of children: 0   Years of education: 2 years college   Highest education level: Not on file  Occupational History   Occupation: Deli at [employer]  Tobacco Use   Smoking status: Former    Pack years: 0.00    Types: Cigarettes    Quit date: 01/21/2018    Years since quitting: 3.0   Smokeless tobacco: Never  Vaping Use   Vaping Use: Never used  Substance and Sexual Activity   Alcohol use: Yes    Alcohol/week: 9.0 standard drinks    Types: 9 Shots of liquor per week    Comment: 02/2019 "  2 shots every 3 or 4 days "   Drug use: No   Sexual activity: Not Currently  Other Topics Concern   Not on file  Social History Narrative   Lives at home alone.   Right-handed.   No caffeine use.   Social Determinants of Health   Financial Resource Strain: Not on file  Food Insecurity: Not on file  Transportation Needs: Not on file  Physical Activity: Not on file  Stress: Not on file  Social Connections: Not on file  Intimate Partner Violence: Not on file    Total time spent reviewing the chart, obtaining history, examined patient, ordering tests, documentation, consultations and family, care coordination was 3445 mintues    Donna Short, M.D. Ph.D.  [facility] [address], [address] [place], [place] [address] Ph: [phone]) [phone] Fax: [phone]  CC:  Orland MustardWolfe, Allison, MD [address] [place],  [place] [id number]  Orland MustardWolfe, Allison, MD

## 2021-04-19 ENCOUNTER — Other Ambulatory Visit (HOSPITAL_COMMUNITY): Payer: Self-pay

## 2021-04-28 ENCOUNTER — Emergency Department (HOSPITAL_COMMUNITY): Payer: Managed Care, Other (non HMO)

## 2021-04-28 ENCOUNTER — Other Ambulatory Visit: Payer: Self-pay

## 2021-04-28 ENCOUNTER — Encounter (HOSPITAL_COMMUNITY): Payer: Self-pay | Admitting: Emergency Medicine

## 2021-04-28 ENCOUNTER — Inpatient Hospital Stay (HOSPITAL_COMMUNITY)
Admission: EM | Admit: 2021-04-28 | Discharge: 2021-04-30 | DRG: 101 | Disposition: A | Discharge status: Home / Self Care | Payer: Managed Care, Other (non HMO) | Attending: Internal Medicine | Admitting: Internal Medicine

## 2021-04-28 DIAGNOSIS — G8384 Todd's paralysis (postepileptic): Secondary | ICD-10-CM | POA: Diagnosis not present

## 2021-04-28 DIAGNOSIS — I6783 Posterior reversible encephalopathy syndrome: Secondary | ICD-10-CM | POA: Diagnosis present

## 2021-04-28 DIAGNOSIS — Z888 Allergy status to other drugs, medicaments and biological substances status: Secondary | ICD-10-CM

## 2021-04-28 DIAGNOSIS — F32A Depression, unspecified: Secondary | ICD-10-CM | POA: Diagnosis present

## 2021-04-28 DIAGNOSIS — Z8249 Family history of ischemic heart disease and other diseases of the circulatory system: Secondary | ICD-10-CM

## 2021-04-28 DIAGNOSIS — R4701 Aphasia: Secondary | ICD-10-CM | POA: Diagnosis not present

## 2021-04-28 DIAGNOSIS — Z20822 Contact with and (suspected) exposure to covid-19: Secondary | ICD-10-CM | POA: Diagnosis present

## 2021-04-28 DIAGNOSIS — Z91013 Allergy to seafood: Secondary | ICD-10-CM

## 2021-04-28 DIAGNOSIS — R519 Headache, unspecified: Secondary | ICD-10-CM | POA: Diagnosis not present

## 2021-04-28 DIAGNOSIS — R569 Unspecified convulsions: Secondary | ICD-10-CM | POA: Diagnosis not present

## 2021-04-28 DIAGNOSIS — E875 Hyperkalemia: Secondary | ICD-10-CM | POA: Diagnosis present

## 2021-04-28 DIAGNOSIS — G9340 Encephalopathy, unspecified: Secondary | ICD-10-CM

## 2021-04-28 DIAGNOSIS — J45909 Unspecified asthma, uncomplicated: Secondary | ICD-10-CM | POA: Diagnosis present

## 2021-04-28 DIAGNOSIS — Z8669 Personal history of other diseases of the nervous system and sense organs: Secondary | ICD-10-CM | POA: Diagnosis not present

## 2021-04-28 DIAGNOSIS — Z91010 Allergy to peanuts: Secondary | ICD-10-CM

## 2021-04-28 DIAGNOSIS — Z825 Family history of asthma and other chronic lower respiratory diseases: Secondary | ICD-10-CM

## 2021-04-28 DIAGNOSIS — Z91040 Latex allergy status: Secondary | ICD-10-CM

## 2021-04-28 DIAGNOSIS — I10 Essential (primary) hypertension: Secondary | ICD-10-CM | POA: Diagnosis present

## 2021-04-28 DIAGNOSIS — Z87891 Personal history of nicotine dependence: Secondary | ICD-10-CM

## 2021-04-28 DIAGNOSIS — Z79899 Other long term (current) drug therapy: Secondary | ICD-10-CM

## 2021-04-28 LAB — CBC WITH DIFFERENTIAL/PLATELET
Abs Immature Granulocytes: 0.01 10*3/uL (ref 0.00–0.07)
Basophils Absolute: 0 10*3/uL (ref 0.0–0.1)
Basophils Relative: 0 %
Eosinophils Absolute: 0.1 10*3/uL (ref 0.0–0.5)
Eosinophils Relative: 1 %
HCT: 39.1 % (ref 36.0–46.0)
Hemoglobin: 13 g/dL (ref 12.0–15.0)
Immature Granulocytes: 0 %
Lymphocytes Relative: 33 %
Lymphs Abs: 2.3 10*3/uL (ref 0.7–4.0)
MCH: 34.8 pg — ABNORMAL HIGH (ref 26.0–34.0)
MCHC: 33.2 g/dL (ref 30.0–36.0)
MCV: 104.5 fL — ABNORMAL HIGH (ref 80.0–100.0)
Monocytes Absolute: 0.4 10*3/uL (ref 0.1–1.0)
Monocytes Relative: 6 %
Neutro Abs: 4.2 10*3/uL (ref 1.7–7.7)
Neutrophils Relative %: 60 %
Platelets: 172 10*3/uL (ref 150–400)
RBC: 3.74 MIL/uL — ABNORMAL LOW (ref 3.87–5.11)
RDW: 11.7 % (ref 11.5–15.5)
WBC: 7 10*3/uL (ref 4.0–10.5)
nRBC: 0 % (ref 0.0–0.2)

## 2021-04-28 LAB — BASIC METABOLIC PANEL
Anion gap: 10 (ref 5–15)
BUN: 27 mg/dL — ABNORMAL HIGH (ref 6–20)
CO2: 25 mmol/L (ref 22–32)
Calcium: 9.8 mg/dL (ref 8.9–10.3)
Chloride: 101 mmol/L (ref 98–111)
Creatinine, Ser: 1.02 mg/dL — ABNORMAL HIGH (ref 0.44–1.00)
GFR, Estimated: >60 mL/min (ref >60)
Glucose, Bld: 108 mg/dL — ABNORMAL HIGH (ref 70–99)
Potassium: 5.9 mmol/L — ABNORMAL HIGH (ref 3.5–5.1)
Sodium: 136 mmol/L (ref 135–145)

## 2021-04-28 LAB — CBG MONITORING, ED: Glucose-Capillary: 80 mg/dL (ref 70–99)

## 2021-04-28 LAB — I-STAT BETA HCG BLOOD, ED (MC, WL, AP ONLY): I-stat hCG, quantitative: <5 m[IU]/mL (ref <5)

## 2021-04-28 LAB — RAPID URINE DRUG SCREEN, HOSP PERFORMED
Amphetamines: NOT DETECTED
Barbiturates: NOT DETECTED
Benzodiazepines: NOT DETECTED
Cocaine: NOT DETECTED
Opiates: NOT DETECTED
Tetrahydrocannabinol: NOT DETECTED

## 2021-04-28 LAB — ETHANOL: Alcohol, Ethyl (B): <10 mg/dL (ref <10)

## 2021-04-28 MED ORDER — LEVETIRACETAM IN NACL 1500 MG/100ML IV SOLN
1500.0000 mg | Freq: Two times a day (BID) | INTRAVENOUS | Status: DC
  Administered 2021-04-29: 1500 mg via INTRAVENOUS
  Filled 2021-04-28: qty 100

## 2021-04-28 MED ORDER — LEVETIRACETAM IN NACL 1000 MG/100ML IV SOLN
1000.0000 mg | Freq: Once | INTRAVENOUS | Status: AC
  Administered 2021-04-28: 1000 mg via INTRAVENOUS
  Filled 2021-04-28: qty 100

## 2021-04-28 MED ORDER — SODIUM CHLORIDE 0.9 % IV SOLN
100.0000 mg | Freq: Two times a day (BID) | INTRAVENOUS | Status: DC
  Administered 2021-04-29: 100 mg via INTRAVENOUS
  Filled 2021-04-28 (×3): qty 10

## 2021-04-28 MED ORDER — SODIUM CHLORIDE 0.9 % IV SOLN
200.0000 mg | Freq: Once | INTRAVENOUS | Status: AC
  Administered 2021-04-28: 200 mg via INTRAVENOUS
  Filled 2021-04-28: qty 20

## 2021-04-28 MED ORDER — SODIUM CHLORIDE 0.9 % IV SOLN: INTRAVENOUS | Status: DC

## 2021-04-28 MED ORDER — HYDRALAZINE HCL 20 MG/ML IJ SOLN: 10.0000 mg | INTRAMUSCULAR | Status: DC | PRN

## 2021-04-28 MED ORDER — ACETAMINOPHEN 650 MG RE SUPP: 650.0000 mg | Freq: Four times a day (QID) | RECTAL | Status: DC | PRN

## 2021-04-28 MED ORDER — SODIUM CHLORIDE 0.9 % IV SOLN
2000.0000 mg | Freq: Once | INTRAVENOUS | Status: AC
  Administered 2021-04-28: 2000 mg via INTRAVENOUS
  Filled 2021-04-28: qty 20

## 2021-04-28 MED ORDER — ACETAMINOPHEN 325 MG PO TABS: 650.0000 mg | ORAL_TABLET | Freq: Four times a day (QID) | ORAL | Status: DC | PRN

## 2021-04-28 NOTE — ED Provider Notes (Signed)
[facility] EMERGENCY DEPARTMENT Provider Note   CSN: [id number] Arrival date & time: 04/28/21  1419     History Chief Complaint  Patient presents with   Seizures   Level 5 caveat altered mental status  Donna Short is a 30 y.o. female.  Past medical history of seizures, asthma, hypertension, depression, pancreatitis, alcohol use.  Patient presents via EMS after having a witnessed focal seizure activity at work.  Patient was sitting in the break room and started acting weird and was talking nonsensical.  Patient then abruptly stopped talking and had rapid eye movements.  Per EMS patient has been nonverbal and not following commands  When I spoke to patient she was unable to obtain a history from.  I talked to her mom who says that she is on seizure medication at home and she has been taking them.  She is recently been evaluated for seizures and she says that she has never had this aphasia or right-sided weakness before.   Seizures     Past Medical History:  Diagnosis Date   Asthma    Depression    History of chicken pox    Hypertension    Migraines    Pancreatitis 01/2018   Seizures [facility])     Patient Active Problem List   Diagnosis Date Noted   Nonintractable epilepsy without status epilepticus (HCC) 11/22/2020   Abnormal finding on MRI of brain 11/01/2020   Seizure (HCC) 10/22/2020   PRES (posterior reversible encephalopathy syndrome) 07/18/2020   Acute on chronic respiratory failure with hypoxia (HCC) 06/04/2020   Acute metabolic encephalopathy 06/04/2020   Status post tracheostomy (HCC)    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     Family History  Problem Relation Age of Onset   Diabetes Father    Arthritis Father    Alcohol abuse Father    Breast cancer Mother    Asthma Mother    Drug abuse Mother    Hypertension Mother     Social History   Tobacco Use   Smoking status: Former    Types: Cigarettes    Quit date: 01/21/2018    Years since quitting: 3.2   Smokeless tobacco: Never  Vaping Use   Vaping Use: Never used  Substance Use Topics   Alcohol use: Yes    Alcohol/week: 9.0 standard drinks    Types: 9 Shots of liquor per week    Comment: 02/2019 "  2 shots every 3 or 4 days "   Drug use: No    Home Medications Prior to Admission medications   Medication Sig Start Date End Date Taking? Authorizing Provider  amLODipine (NORVASC) 5 MG tablet TAKE ONE TABLET BY MOUTH DAILY Patient taking differently: Take 5 mg by mouth daily. 01/22/21   Orland Mustard, MD  folic acid (FOLVITE) 1 MG tablet TAKE ONE TABLET BY MOUTH DAILY Patient taking differently: Take 1  mg by mouth daily. 01/22/21   Orland Mustard, MD  lamoTRIgine (LAMICTAL) 200 MG tablet Take 1 tablet (200 mg total) by mouth 2 (two) times daily. 02/21/21   Levert Feinstein, MD  levETIRAcetam (KEPPRA) 500 MG tablet Take 1 tablet (500 mg total) by mouth 2 (two) times daily. 02/21/21   Levert Feinstein, MD  Multiple Vitamin (MULTIVITAMIN WITH MINERALS) TABS tablet Place 1 tablet into feeding tube daily. Patient taking differently: Take 1 tablet by mouth daily. 06/01/20   Jeanella Craze, NP  sertraline (ZOLOFT) 100 MG tablet Take 1 tablet (100 mg total) by mouth daily. 02/21/21   Levert Feinstein, MD    Allergies    Claritin [loratadine], Peanut-containing drug products, Benadryl [diphenhydramine hcl (sleep)], Latex, and Shrimp [shellfish allergy]  Review of Systems    Review of Systems  Unable to perform ROS: Mental status change  Neurological:  Positive for seizures.   Physical Exam Updated Vital Signs BP (!) 138/112   Pulse (!) 111   Temp 99 F (37.2 C) (Oral)   Resp 19   Wt 51.7 kg   SpO2 97%   BMI 20.85 kg/m   Physical Exam Vitals and nursing note reviewed.  Constitutional:      General: She is not in acute distress.    Appearance: She is not toxic-appearing.  HENT:     Head: Normocephalic and atraumatic.     Mouth/Throat:     Tongue: Tongue does not deviate from midline.  Eyes:     General: Gaze aligned appropriately. No scleral icterus.       Right eye: No discharge.        Left eye: No discharge.     Extraocular Movements: Extraocular movements intact.     Right eye: Nystagmus present.     Left eye: Nystagmus present.     Conjunctiva/sclera: Conjunctivae normal.     Pupils: Pupils are equal, round, and reactive to light. Pupils are equal.  Pulmonary:     Effort: No respiratory distress.  Neurological:     Mental Status: She is alert. She is confused.     GCS: GCS eye subscore is 4. GCS verbal subscore is 3. GCS motor subscore is 6.     Cranial Nerves: No facial asymmetry.     Sensory: Sensation is intact.     Motor: Weakness, tremor and abnormal muscle tone present. No seizure activity.     Comments: Patient with RUE extremity weakness. She is able to move hand but unable to lift arm all the way up. She would not squeeze on command, but she was moving her right hand. She did have some tremor activity in right hand.   Follows commands and has full strength in all other extremities  Patient with expressive aphasia. Garbled speech. Seems to understand and follows commands.   Psychiatric:        Mood and Affect: Mood is anxious.        Behavior: Behavior is agitated.    ED Results / Procedures / Treatments   Labs (all labs ordered are listed, but only abnormal results are displayed) Labs Reviewed  CBC WITH  DIFFERENTIAL/PLATELET - Abnormal; Notable for the following components:      Result Value   RBC 3.74 (*)    MCV 104.5 (*)    MCH 34.8 (*)    All other components within normal limits  BASIC METABOLIC PANEL - Abnormal; Notable for the following components:   Potassium 5.9 (*)    Glucose,  Bld 108 (*)    BUN 27 (*)    Creatinine, Ser 1.02 (*)    All other components within normal limits  SARS CORONAVIRUS 2 (TAT 6-24 HRS)  ETHANOL  RAPID URINE DRUG SCREEN, HOSP PERFORMED  I-STAT BETA HCG BLOOD, ED ([facility], [facility], [facility] ONLY)  CBG MONITORING, ED    EKG None  Radiology CT HEAD WO CONTRAST ( )  Result Date: 04/28/2021 CLINICAL DATA:  Seizure, abnormal neuro exam. Additional provided: Focal seizure activity. EXAM: CT HEAD WITHOUT CONTRAST TECHNIQUE: Contiguous axial images were obtained from the base of the skull through the vertex without intravenous contrast. COMPARISON:  Brain MRI 10/22/2020. FINDINGS: Brain: Cerebral volume is normal. There is no acute intracranial hemorrhage. No acute demarcated cortical infarct. No extra-axial fluid collection. No evidence of an intracranial mass. No midline shift. Vascular: No hyperdense vessel. Skull: Normal. Negative for fracture or focal lesion. Sinuses/Orbits: Visualized orbits show no acute finding. No significant paranasal sinus disease at the imaged levels. IMPRESSION: No evidence of acute intracranial abnormality. Please refer to the brain MRI of 10/22/2020 for a description of small regions of cortical/subcortical T2/FLAIR hyperintense signal abnormality demonstrated within the left parietal and occipital lobes on this prior exam. Electronically Signed   By: Jackey Loge D.O.   On: 04/28/2021 18:01   EEG adult  Result Date: 04/28/2021 [name], MD     04/28/2021  8:00 PM Patient Name: Donna Short MRN: [id number] Epilepsy Attending: [name] Referring Physician/Provider: Dr Bing Neighbors Date: 04/28/2021 Duration: 24.14 mins Patient  history:  30yo F with history of multiple admissions with 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  She has persistent deficits in the ER, remains extremely confused, and has had at least 1 episode in the ED concerning for focal seizure in the right upper extremity since arrival. EEG to evaluate for seizure Level of alertness: Awake, asleep AEDs during EEG study: LEV, LCM Technical aspects: This EEG study was done with scalp electrodes positioned according to the 10-20 International system of electrode placement. Electrical activity was acquired at a sampling rate of 500Hz  and reviewed with a high frequency filter of 70Hz  and a low frequency filter of 1Hz . EEG data were recorded continuously and digitally stored. Description: No clear posterior dominant rhythm was seen. Sleep was characterized by sleep spindles (12 to 14 Hz), maximal frontocentral region. EEG showed abundant spikes in left posterior temporal region, maximal P7 at times periodic at 0.5-1Hz . There were also brief 1 seconds periods of eeg attenuation after a run of polyspikes. EEG also showed continuous  generalized and  lateralized left hemisphere 3 to 6 Hz theta-delta slowing with overriding 15-18hz  beta activity. Hyperventilation and photic stimulation were not performed.   ABNORMALITY - Spike, left posterior temporal region - Continuous slow, generalized and lateralized left  IMPRESSION: This study showed evidence of epileptogenicity arising from left posterior temporal region as well as cortical dysfunction in left hemisphere due to underlying structural abnormality, post-ictal state. Additionally, there is evidence of moderate diffuse encephalopathy, nonspecific etiology but likely related to seizure.  Dr was notified     Procedures Procedures   Medications Ordered in ED Medications  levETIRAcetam (KEPPRA) 2,000 mg in sodium chloride 0.9 % 250 mL IVPB (0 mg Intravenous Stopped 04/28/21 1836)    Followed by   levETIRAcetam (KEPPRA) IVPB 1500 mg/ 100 mL premix (has no administration in time range)  lacosamide (VIMPAT) 200 mg in sodium chloride 0.9 % 25 mL IVPB (0  mg Intravenous Stopped 04/28/21 1931)    Followed by  lacosamide (VIMPAT) 100 mg in sodium chloride 0.9 % 25 mL IVPB (has no administration in time range)  levETIRAcetam (KEPPRA) IVPB 1000 mg/100 mL premix (0 mg Intravenous Stopped 04/28/21 1647)    ED Course  I have reviewed the triage vital signs and the nursing notes.  Pertinent labs & imaging results that were available during my care of the patient were reviewed by me and considered in my medical decision making (see chart for details).  Clinical Course as of 04/28/21 2125  Wynelle LinkSun Apr 28, 2021  1657 Consulted with Neurohospitalist. They will come down to see patient.  [GL]  1657 Stat Head CT with spot EEG [GL]    Clinical Course User Index [GL] Jousha Schwandt, Finis BudGrace C, PA-C   MDM Rules/Calculators/A&P                          Patient presents to the ED after a witnessed seizure.  She has a known history of seizures and is on Keppra and Lamictal that she is reportedly taking.  Patient is slightly tachycardic but otherwise vitals are stable. she is afebrile.  On exam she does have expressive aphasia but does seem to be following commands.  She has some right upper extremity paralysis and tremors.  Cranial nerves are grossly intact.  She appears to be anxious but in no acute distress.History and exam is consistent with postictal seizure.    Discussed case with Dr. Stevie Kernykstra and will proceed with giving 1 g IV Keppra.  Labs are significant for hyperkalemia which is baseline, UDS and alcohol level negative..  Otherwise unremarkable.  Spoke with neurohospitalist team at [phone].  They recommend stat head CT with spot EEG. They will come down to see patient. Recommended additional Keppra and Vimpat load along with scheduled doses of each.   Plan at this time is for patient to have CT scan head followed  by EEG.  CT negative for acute intracranial abnormality.  Spot EEG obtained showing evidence of epileptogenicity arising from left posterior temporal region consistent with postictal state.  He is not actively seizing.  Neurology aware.  Plan to admit to hospitalist for observation and continued EEG.   Spoke with Dr. Toniann FailKakrakandy at 2107 who will admit patient to hospitalist service.   Final Clinical Impression(s) / ED Diagnoses Final diagnoses:  Seizure [facility](HCC)    Rx / DC Orders ED Discharge Orders     None        Claudie LeachLoeffler, Pharrell Ledford C, PA-C 04/28/21 2127    Milagros Lollykstra, Richard S, MD 04/30/21 2225

## 2021-04-28 NOTE — ED Notes (Signed)
Pt given sandwich per MD.

## 2021-04-28 NOTE — H&P (Signed)
History and Physical    Donna Short FYB:[id number] DOB: 04/01/1991 DOA: 04/28/2021  PCP: Orland Mustard, MD  Patient coming from: Home.  I have obtained most of the history from ER physician and previous records.  Chief Complaint: Seizures.  HPI: Donna Short is a 30 y.o. female with history of seizures and hypertension and prior history of PRESS was brought to the ER after patient was found to have a seizure-like activity.  Patient was behaving strangely at workplace and speaking nonsensical.  EMS arrived and found the patient was nonverbal and had some rapid eye movement.  Patient was brought to the ER.  ED Course: In the ER patient is found to have weakness of the right upper and lower extremities at times had spasms of the right and lower extremities.  CT head was unremarkable.  Urine drug screen was negative.  Patient was afebrile.  Neurologist on-call was consulted patient was loaded with Keppra and started on IV Keppra and Vimpat.  Admitted for further management of her seizures.  Patient at arrival was lethargic.  On my exam patient is still mildly confused but following commands moving all extremities.  Labs show macrocytosis.  Potassium was 5.9 creatinine 1.02 EKG shows normal sinus rhythm COVID test is pending.  Review of Systems: As per HPI, rest all negative.   Past Medical History:  Diagnosis Date   Asthma    Depression    History of chicken pox    Hypertension    Migraines    Pancreatitis 01/2018   Seizures (HCC)     Past Surgical History:  Procedure Laterality Date   WISDOM TOOTH EXTRACTION       reports that she quit smoking about 3 years ago. Her smoking use included cigarettes. She has never used smokeless tobacco. She reports current alcohol use of about 9.0 standard drinks per week. She reports that she does not use drugs.  Allergies  Allergen Reactions   Claritin [Loratadine] Itching   Peanut-Containing Drug Products Other (See Comments)     Family not sure of reaction   Benadryl [Diphenhydramine Hcl (Sleep)] Rash   Latex Rash   Shrimp [Shellfish Allergy] Hives and Rash    Family History  Problem Relation Age of Onset   Diabetes Father    Arthritis Father    Alcohol abuse Father    Breast cancer Mother    Asthma Mother    Drug abuse Mother    Hypertension Mother     Prior to Admission medications   Medication Sig Start Date End Date Taking? Authorizing Provider  amLODipine (NORVASC) 5 MG tablet TAKE ONE TABLET BY MOUTH DAILY Patient taking differently: Take 5 mg by mouth daily. 01/22/21   Orland Mustard, MD  folic acid (FOLVITE) 1 MG tablet TAKE ONE TABLET BY MOUTH DAILY Patient taking differently: Take 1 mg by mouth daily. 01/22/21   Orland Mustard, MD  lamoTRIgine (LAMICTAL) 200 MG tablet Take 1 tablet (200 mg total) by mouth 2 (two) times daily. 02/21/21   Levert Feinstein, MD  levETIRAcetam (KEPPRA) 500 MG tablet Take 1 tablet (500 mg total) by mouth 2 (two) times daily. 02/21/21   Levert Feinstein, MD  Multiple Vitamin (MULTIVITAMIN WITH MINERALS) TABS tablet Place 1 tablet into feeding tube daily. Patient taking differently: Take 1 tablet by mouth daily. 06/01/20   Jeanella Craze, NP  sertraline (ZOLOFT) 100 MG tablet Take 1 tablet (100 mg total) by mouth daily. 02/21/21   Levert Feinstein, MD  Physical Exam: Constitutional: Moderately built and nourished. Vitals:   04/28/21 2055 04/28/21 2200 04/28/21 2230 04/28/21 2300  BP: (!) 138/112 (!) 128/96 (!) 129/93 (!) 139/100  Pulse: (!) 111 (!) 102 (!) 102 88  Resp: 19 19 (!) 23 20  Temp:    98.3 F (36.8 C)  TempSrc:    Oral  SpO2: 97% 98% 99% 100%  Weight:       Eyes: Anicteric no pallor. ENMT: No discharge from the ears eyes nose and mouth. Neck: No mass felt.  No neck rigidity. Respiratory: No rhonchi or crepitations. Cardiovascular: S1-S2 heard. Abdomen: Soft nontender bowel sound present. Musculoskeletal: No edema. Skin: No rash. Neurologic: Patient is sleeping but  arousable and appears confused.  Oriented to her name moving all extremities.  Pupils are reacting to light. Psychiatric: Appears confused.   Labs on Admission: I have personally reviewed following labs and imaging studies  CBC: Recent Labs  Lab 04/28/21 1434  WBC 7.0  NEUTROABS 4.2  HGB 13.0  HCT 39.1  MCV 104.5*  PLT 172   Basic Metabolic Panel: Recent Labs  Lab 04/28/21 1434  NA 136  K 5.9*  CL 101  CO2 25  GLUCOSE 108*  BUN 27*  CREATININE 1.02*  CALCIUM 9.8   GFR: Estimated Creatinine Clearance: 62.6 mL/min (A) (by C-G formula based on SCr of 1.02 mg/dL (H)). Liver Function Tests: No results for input(s): AST, ALT, ALKPHOS, BILITOT, PROT, ALBUMIN in the last 168 hours. No results for input(s): LIPASE, AMYLASE in the last 168 hours. No results for input(s): AMMONIA in the last 168 hours. Coagulation Profile: No results for input(s): INR, PROTIME in the last 168 hours. Cardiac Enzymes: No results for input(s): CKTOTAL, CKMB, CKMBINDEX, TROPONINI in the last 168 hours. BNP (last 3 results) No results for input(s): PROBNP in the last 8760 hours. HbA1C: No results for input(s): HGBA1C in the last 72 hours. CBG: Recent Labs  Lab 04/28/21 1537  GLUCAP 80   Lipid Profile: No results for input(s): CHOL, HDL, LDLCALC, TRIG, CHOLHDL, LDLDIRECT in the last 72 hours. Thyroid Function Tests: No results for input(s): TSH, T4TOTAL, FREET4, T3FREE, THYROIDAB in the last 72 hours. Anemia Panel: No results for input(s): VITAMINB12, FOLATE, FERRITIN, TIBC, IRON, RETICCTPCT in the last 72 hours. Urine analysis:    Component Value Date/Time   COLORURINE YELLOW 10/23/2020 1900   APPEARANCEUR HAZY (A) 10/23/2020 1900   LABSPEC 1.019 10/23/2020 1900   PHURINE 5.0 10/23/2020 1900   GLUCOSEU NEGATIVE 10/23/2020 1900   HGBUR NEGATIVE 10/23/2020 1900   BILIRUBINUR NEGATIVE 10/23/2020 1900   KETONESUR NEGATIVE 10/23/2020 1900   PROTEINUR NEGATIVE 10/23/2020 1900    UROBILINOGEN 0.2 10/07/2015 1958   NITRITE NEGATIVE 10/23/2020 1900   LEUKOCYTESUR NEGATIVE 10/23/2020 1900   Sepsis Labs: @LABRCNTIP (procalcitonin:4,lacticidven:4) )No results found for this or any previous visit (from the past 240 hour(s)).   Radiological Exams on Admission: CT HEAD WO CONTRAST ( )  Result Date: 04/28/2021 CLINICAL DATA:  Seizure, abnormal neuro exam. Additional provided: Focal seizure activity. EXAM: CT HEAD WITHOUT CONTRAST TECHNIQUE: Contiguous axial images were obtained from the base of the skull through the vertex without intravenous contrast. COMPARISON:  Brain MRI 10/22/2020. FINDINGS: Brain: Cerebral volume is normal. There is no acute intracranial hemorrhage. No acute demarcated cortical infarct. No extra-axial fluid collection. No evidence of an intracranial mass. No midline shift. Vascular: No hyperdense vessel. Skull: Normal. Negative for fracture or focal lesion. Sinuses/Orbits: Visualized orbits show no acute finding. No significant paranasal sinus  disease at the imaged levels. IMPRESSION: No evidence of acute intracranial abnormality. Please refer to the brain MRI of 10/22/2020 for a description of small regions of cortical/subcortical T2/FLAIR hyperintense signal abnormality demonstrated within the left parietal and occipital lobes on this prior exam. Electronically Signed   By: Jackey LogeKyle  Golden D.O.   On: 04/28/2021 18:01   EEG adult  Result Date: 04/28/2021 Charlsie QuestYadav, Priyanka O, MD     04/28/2021  8:00 PM Patient Name: Romona CurlsShamika M Cisek MRN: [id number] Epilepsy Attending: Charlsie QuestPriyanka O Yadav Referring Physician/Provider: Dr Bing Neighborsolleen Stack Date: 04/28/2021 Duration: 24.14 mins Patient history:  30yo F with history of multiple admissions with 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  She has persistent deficits in the ER, remains extremely confused, and has had at least 1 episode in the ED concerning for focal seizure in the right upper extremity since arrival. EEG to  evaluate for seizure Level of alertness: Awake, asleep AEDs during EEG study: LEV, LCM Technical aspects: This EEG study was done with scalp electrodes positioned according to the 10-20 International system of electrode placement. Electrical activity was acquired at a sampling rate of 500Hz  and reviewed with a high frequency filter of 70Hz  and a low frequency filter of 1Hz . EEG data were recorded continuously and digitally stored. Description: No clear posterior dominant rhythm was seen. Sleep was characterized by sleep spindles (12 to 14 Hz), maximal frontocentral region. EEG showed abundant spikes in left posterior temporal region, maximal P7 at times periodic at 0.5-1Hz . There were also brief 1 seconds periods of eeg attenuation after a run of polyspikes. EEG also showed continuous  generalized and  lateralized left hemisphere 3 to 6 Hz theta-delta slowing with overriding 15-18hz  beta activity. Hyperventilation and photic stimulation were not performed.   ABNORMALITY - Spike, left posterior temporal region - Continuous slow, generalized and lateralized left  IMPRESSION: This study showed evidence of epileptogenicity arising from left posterior temporal region as well as cortical dysfunction in left hemisphere due to underlying structural abnormality, post-ictal state. Additionally, there is evidence of moderate diffuse encephalopathy, nonspecific etiology but likely related to seizure.  Dr Selina CooleyStack was notified  Charlsie QuestPriyanka O Yadav    EKG: Independently reviewed.  Normal sinus rhythm.  Assessment/Plan Active Problems:   Benign essential HTN   Seizure (HCC)    Seizures -appreciate neurology consult.  Patient was loaded on Keppra and presently is on IV Keppra and Vimpat.  Patient is getting EEG and MRI brain with and without contrast.  Further recommendations per neurology.  For now patient be kept n.p.o. until patient is more alert and awake. History of hypertension -until patient can reliably take p.o.'s we  will keep patient on as needed IV hydralazine.  Blood pressure has not been very elevated. Prior history of PRES syndrome. Macrocytosis -patient states she does drink alcohol could be from alcoholism.  Check anemia panel with next blood draw. History of alcohol use placed on CIWA.  Thiamine.  Need to get further history from patient about alcohol use. Acute renal failure with hyperkalemia -gently hydrate and recheck metabolic panel.  If potassium is still persistently elevated will give temporizing measures.  Patient's potassium had worsened from 0.8 in March 2022 when it is around 1.02.  COVID test is pending.   DVT prophylaxis: SCDs for now.  If no procedure anticipated may change to Lovenox. Code Status: Full code. Family Communication: We will need to discuss with family. Disposition Plan: Home when stable. Consults called: Neurology. Admission status: Observation.  Eduard Clos MD [facility] Pager [phone].  If 7PM-7AM, please contact night-coverage [email or web address] Password [facility]  04/28/2021, 11:42 PM

## 2021-04-28 NOTE — ED Triage Notes (Addendum)
Pt here via [facility] from work for focal seizure activity. Pt was sitting in break room, started acting weird and nonsensical talking, pt then stopped talking and had rapid eye movements. Per EMS pt has been nonverbal, not following commands. Per mom, pt has been taking meds for sz.

## 2021-04-28 NOTE — ED Notes (Signed)
Patient transported to CT 

## 2021-04-28 NOTE — ED Provider Notes (Signed)
Emergency Medicine Provider Triage Evaluation Note  VERNESSA LIKES , a 30 y.o. female  was evaluated in triage.  Pt complains of seizure, brought in by EMS, was at work today talking when she suddenly stopped talking and had rapid eye movement. Mom reports taking meds as prescribed, no falls/injuries.   Review of Systems  Positive: Does not answer questions Negative: Does not answer questions  Physical Exam  There were no vitals taken for this visit. Gen:   Awake, no distress   Resp:  Normal effort  MSK:   Moves extremities without difficulty  Other:  Follows simple commands, does not answer questions, no unilateral weakness  Medical Decision Making  Medically screening exam initiated at 2:27 PM.  Appropriate orders placed.  KAJAL SCALICI was informed that the remainder of the evaluation will be completed by another provider, this initial triage assessment does not replace that evaluation, and the importance of remaining in the ED until their evaluation is complete.     Jeannie Fend, PA-C 04/28/21 1432    Derwood Kaplan, MD 04/28/21 2156

## 2021-04-28 NOTE — Procedures (Signed)
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  She has persistent deficits in the ER, remains extremely confused, and has had at least 1 episode in the ED concerning for focal seizure in the right upper extremity since arrival. EEG to evaluate for seizure  Level of alertness: Awake, asleep  AEDs during EEG study: LEV, LCM  Technical aspects: This EEG study was done with scalp electrodes positioned according to the 10-20 International system of electrode placement. Electrical activity was acquired at a sampling rate of 500Hz  and reviewed with a high frequency filter of 70Hz  and a low frequency filter of 1Hz . EEG data were recorded continuously and digitally stored.   Description: No clear posterior dominant rhythm was seen. Sleep was characterized by sleep spindles (12 to 14 Hz), maximal frontocentral region. EEG showed abundant spikes in left posterior temporal region, maximal P7 at times periodic at 0.5-1Hz . There were also brief 1 seconds periods of eeg attenuation after a run of polyspikes. EEG also showed continuous  generalized and  lateralized left hemisphere 3 to 6 Hz theta-delta slowing with overriding 15-18hz  beta activity. Hyperventilation and photic stimulation were not performed.     ABNORMALITY - Spike, left posterior temporal region - Continuous slow, generalized and lateralized left   IMPRESSION: This study showed evidence of epileptogenicity arising from left posterior temporal region as well as cortical dysfunction in left hemisphere due to underlying structural abnormality, post-ictal state. Additionally, there is evidence of moderate diffuse encephalopathy, nonspecific etiology but likely related to seizure.   Dr was notified      Astha Probasco 

## 2021-04-28 NOTE — Progress Notes (Signed)
LTM EEG hooked up and running - no initial skin breakdown - push button tested - neuro notified. Patient in ER,  Not being monitored by [facility].

## 2021-04-28 NOTE — Consult Note (Addendum)
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  Patient presented today by EMS after having a witnessed focal seizure at work.  She was sitting in the break room and started acting strangely and talking nonsensically.  Afterwards she abruptly stopped talking and had rapid eye movements.  When EMS arrived she was nonverbal and not following commands.  After arrival to the emergency department poor per her mother she had an episode of right upper extremity involuntary spasm that was persistent for minutes.  On my examination she is lethargic, confused, mixing up her words.  She also has 4/5 weakness in her right upper extremity and right lower extremity.  Per mother she has not had a focal Todd's paralysis after prior seizures.  Other pmhx incl asthma, hypertension, depression, pancreatitis, and alcohol abuse.  Regarding seizure history Patient unable to provide history therefore hx gleaned from chart review. Per last progress note 02/21/21 from her outpatient neurologist Dr. April 2022 at [facility]:  Seizure onset 2017 with 2 GTCs.  Started on Keppra and then transition to lamotrigine due to irritability.  Lost to follow-up until 2019 when she was admitted to [facility] on 05/10/20 for acute on chronic pancreatitis in the setting of binge drinking.  She developed PRES c/b NCSE requiring intubation and suppression with propofol, Versed, Keppra, Dilantin, Onfi, and phenobarbital.  After  discharge her AEDs were further weaned however she represented on November 01, 2020 after loss of consciousness and subsequent confusion.  She was admitted October 22, 2020 and again was found to have subclinical status requiring complex management with multiple AEDs.  She was discharged home on Keppra 1500 mg twice daily, Dilantin 100 mg every 8 hours, phenobarbital 95 mg daily, Vimpat 100 mg twice a day, and perampanel 4 mg daily.  Since that time she has been weaned from her EEG AEDs slowly as an outpatient and is currently on lamotrigine 200 mg twice daily and Keppra 500 mg twice daily.    ROS   UTA 2/2 post-ictal confusion  Past History   Past Medical History:  Diagnosis Date  . Asthma   . Depression   . History of chicken pox   . Hypertension   . Migraines   . Pancreatitis 01/2018  . Seizures (HCC)    Past Surgical History:  Procedure Laterality Date  . WISDOM TOOTH EXTRACTION     Family History  Problem Relation Age of Onset  . Diabetes Father   . Arthritis Father   . Alcohol abuse Father   . Breast cancer Mother   . Asthma Mother   . Drug abuse Mother   . Hypertension Mother    Social History   Socioeconomic History  . Marital status: Single    Spouse name: Not on file  . Number of children: 0  . Years of education: 2 years college  . Highest education level: Not on  file  Occupational History  . Occupation: Deli at [employer]  . Smoking status: Former    Types: Cigarettes    Quit date: 01/21/2018    Years since quitting: 3.2  . Smokeless tobacco: Never  Vaping Use  . Vaping Use: Never used  Substance and Sexual Activity  . Alcohol use: Yes    Alcohol/week: 9.0 standard drinks    Types: 9 Shots of liquor per week    Comment: 02/2019 "  2 shots every 3 or 4 days "  . Drug use: No  . Sexual activity: Not Currently  Other Topics Concern  . Not on file  Social History Narrative   Lives at home alone.   Right-handed.   No caffeine use.    Social Determinants of Health   Financial Resource Strain: Not on file  Food Insecurity: Not on file  Transportation Needs: Not on file  Physical Activity: Not on file  Stress: Not on file  Social Connections: Not on file   Allergies  Allergen Reactions  . Claritin [Loratadine] Itching  . Peanut-Containing Drug Products Other (See Comments)    Family not sure of reaction  . Benadryl [Diphenhydramine Hcl (Sleep)] Rash  . Latex Rash  . Shrimp [Shellfish Allergy] Hives and Rash    Medications    Current Facility-Administered Medications:  .  lacosamide (VIMPAT) 200 mg in sodium chloride 0.9 % 25 mL IVPB, 200 mg, Intravenous, Once **FOLLOWED BY** [START ON 04/29/2021] lacosamide (VIMPAT) 100 mg in sodium chloride 0.9 % 25 mL IVPB, 100 mg, Intravenous, Q12H, Jefferson Fuel, MD .  levETIRAcetam (KEPPRA) 2,000 mg in sodium chloride 0.9 % 250 mL IVPB, 2,000 mg, Intravenous, Once **FOLLOWED BY** [START ON 04/29/2021] levETIRAcetam (KEPPRA) IVPB 1500 mg/ 100 mL premix, 1,500 mg, Intravenous, Q12H, Jefferson Fuel, MD  Current Outpatient Medications:  .  amLODipine (NORVASC) 5 MG tablet, TAKE ONE TABLET BY MOUTH DAILY (Patient taking differently: Take 5 mg by mouth daily.), Disp: 90 tablet, Rfl: 0 .  folic acid (FOLVITE) 1 MG tablet, TAKE ONE TABLET BY MOUTH DAILY (Patient taking differently: Take 1 mg by mouth daily.), Disp: 90 tablet, Rfl: 1 .  lamoTRIgine (LAMICTAL) 200 MG tablet, Take 1 tablet (200 mg total) by mouth 2 (two) times daily., Disp: 180 tablet, Rfl: 4 .  levETIRAcetam (KEPPRA) 500 MG tablet, Take 1 tablet (500 mg total) by mouth 2 (two) times daily., Disp: 60 tablet, Rfl: 11 .  Multiple Vitamin (MULTIVITAMIN WITH MINERALS) TABS tablet, Place 1 tablet into feeding tube daily. (Patient taking differently: Take 1 tablet by mouth daily.), Disp: , Rfl:  .  sertraline (ZOLOFT) 100 MG tablet, Take 1 tablet (100 mg total) by mouth daily., Disp: 90 tablet, Rfl: 3     Vitals    Vitals:   04/28/21 1658 04/28/21 1700 04/28/21 1715 04/28/21 1730  BP: (!) 173/119 (!) 162/126 (!) 168/119 (!) 152/129  Pulse: 82     Resp: (!) 24 19 (!) 24 20  Temp:      TempSrc:      SpO2: 100%     Weight:    51.7 kg     Body mass index is 20.85 kg/m.  Physical Exam   Physical Exam Gen: asleep, arousable but v lethargic, confused. Oriented to self and hospital. Resp: normal WOB CV: RRR Resp: CTAB, no w/r/r Skin: she has a single red bump on anterior midline throat surrounded by mild erythema that looks like an insect bite, no  other rash  Neuro: *MS: asleep, arousable but v lethargic, confused. Oriented to self and hospital. *Speech: moderate dysarthria *OZ:DGUYQ, tracks examiner with bilateral horizontal direction-changing nystagmus, face symmetric, hearing intact to voice: *Motor: 5/5 strength LUE and LLE, 4/5 diffusely RUE and RLE *Sensory: SILT *Coordination, gait: UTA 2/2 mental status *Reflexes: 2+ symmetric with mute toes bilat   Labs   CBC:  Recent Labs  Lab 04/28/21 1434  WBC 7.0  NEUTROABS 4.2  HGB 13.0  HCT 39.1  MCV 104.5*  PLT 172    Basic Metabolic Panel:  Lab Results  Component Value Date   NA 136 04/28/2021   K 5.9 (H) 04/28/2021   CO2 25 04/28/2021   GLUCOSE 108 (H) 04/28/2021   BUN 27 (H) 04/28/2021   CREATININE 1.02 (H) 04/28/2021   CALCIUM 9.8 04/28/2021   GFRNONAA >60 04/28/2021   GFRAA 116 06/27/2020   Lipid Panel:  Lab Results  Component Value Date   LDLCALC 156 (H) 06/27/2020   HgbA1c:  Lab Results  Component Value Date   HGBA1C 5.2 11/18/2019   Urine Drug Screen:     Component Value Date/Time   LABOPIA NONE DETECTED 04/28/2021 1434   COCAINSCRNUR NONE DETECTED 04/28/2021 1434   LABBENZ NONE DETECTED 04/28/2021 1434   AMPHETMU NONE DETECTED 04/28/2021 1434   THCU NONE DETECTED 04/28/2021 1434   LABBARB NONE DETECTED 04/28/2021 1434    Alcohol Level     Component Value Date/Time   ETH <10 04/28/2021 1434     Impression   She has a history of multiple admissions with 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  She has persistent deficits in the ER, remains extremely confused, and has had at least 1 episode in the ED concerning for focal seizure in the right upper extremity since arrival.  Recommendations   - S/p keppra 60 mg/kg load to be f/b 1500mg  q 12 hrs IV - S/p vimpat 200mg  load to be f/b 100mg  q 12 hrs IV - NPO for now - Lamotrigine 200mg  bid currently held 2/2 inability to safely take po - STAT head CT now - After head CT will be hooked up for STAT routine EEG to be followed by continuous EEG (MRI-compatible leads) - MRI brain wwo contrast overnight (hx PRES, but focal Todd's paralysis may be new) - Given hx PRES, if not actively seizing on STAT routine EEG and consistently hypertensive with SBP > 160 start empiric BP mgmt goal SBP <160 with low dose IV hydralazine or with clevidipine or nicardipine gtt. If actively seizing, seizures should be addressed first, because treatment may require drugs with hypotensive effects. - CIWA - pt has hx EtOH abuse and withdrawal seizures, unable to confirm current EtOH use - EtOH level, UDS - UA, UCx - Will continue to follow  ______________________________________________________________________   Thank you for the opportunity to take part in the care of this patient. If you have any further questions, please contact the neurology consultation attending.  Signed,  , MD [facility] [phone]  If 7pm- 7am, please page neurology on call as listed in [facility].

## 2021-04-28 NOTE — ED Notes (Signed)
Attempted to call report to RN. Charge RN states she has to review the chart first.

## 2021-04-29 ENCOUNTER — Inpatient Hospital Stay (HOSPITAL_COMMUNITY): Payer: Managed Care, Other (non HMO)

## 2021-04-29 ENCOUNTER — Encounter: Payer: Self-pay | Admitting: Neurology

## 2021-04-29 DIAGNOSIS — J45909 Unspecified asthma, uncomplicated: Secondary | ICD-10-CM | POA: Diagnosis present

## 2021-04-29 DIAGNOSIS — R569 Unspecified convulsions: Secondary | ICD-10-CM | POA: Diagnosis present

## 2021-04-29 DIAGNOSIS — Z91040 Latex allergy status: Secondary | ICD-10-CM | POA: Diagnosis not present

## 2021-04-29 DIAGNOSIS — R519 Headache, unspecified: Secondary | ICD-10-CM | POA: Diagnosis not present

## 2021-04-29 DIAGNOSIS — E875 Hyperkalemia: Secondary | ICD-10-CM

## 2021-04-29 DIAGNOSIS — I6783 Posterior reversible encephalopathy syndrome: Secondary | ICD-10-CM

## 2021-04-29 DIAGNOSIS — F32A Depression, unspecified: Secondary | ICD-10-CM | POA: Diagnosis present

## 2021-04-29 DIAGNOSIS — Z91010 Allergy to peanuts: Secondary | ICD-10-CM | POA: Diagnosis not present

## 2021-04-29 DIAGNOSIS — Z825 Family history of asthma and other chronic lower respiratory diseases: Secondary | ICD-10-CM | POA: Diagnosis not present

## 2021-04-29 DIAGNOSIS — Z888 Allergy status to other drugs, medicaments and biological substances status: Secondary | ICD-10-CM | POA: Diagnosis not present

## 2021-04-29 DIAGNOSIS — Z87891 Personal history of nicotine dependence: Secondary | ICD-10-CM | POA: Diagnosis not present

## 2021-04-29 DIAGNOSIS — Z20822 Contact with and (suspected) exposure to covid-19: Secondary | ICD-10-CM | POA: Diagnosis present

## 2021-04-29 DIAGNOSIS — I10 Essential (primary) hypertension: Secondary | ICD-10-CM | POA: Diagnosis present

## 2021-04-29 DIAGNOSIS — Z8249 Family history of ischemic heart disease and other diseases of the circulatory system: Secondary | ICD-10-CM | POA: Diagnosis not present

## 2021-04-29 DIAGNOSIS — Z79899 Other long term (current) drug therapy: Secondary | ICD-10-CM | POA: Diagnosis not present

## 2021-04-29 DIAGNOSIS — Z91013 Allergy to seafood: Secondary | ICD-10-CM | POA: Diagnosis not present

## 2021-04-29 LAB — CBC WITH DIFFERENTIAL/PLATELET
Abs Immature Granulocytes: 0.03 10*3/uL (ref 0.00–0.07)
Basophils Absolute: 0 10*3/uL (ref 0.0–0.1)
Basophils Relative: 0 %
Eosinophils Absolute: 0 10*3/uL (ref 0.0–0.5)
Eosinophils Relative: 0 %
HCT: 31.6 % — ABNORMAL LOW (ref 36.0–46.0)
Hemoglobin: 10.9 g/dL — ABNORMAL LOW (ref 12.0–15.0)
Immature Granulocytes: 0 %
Lymphocytes Relative: 23 %
Lymphs Abs: 2.2 10*3/uL (ref 0.7–4.0)
MCH: 34.4 pg — ABNORMAL HIGH (ref 26.0–34.0)
MCHC: 34.5 g/dL (ref 30.0–36.0)
MCV: 99.7 fL (ref 80.0–100.0)
Monocytes Absolute: 0.7 10*3/uL (ref 0.1–1.0)
Monocytes Relative: 7 %
Neutro Abs: 6.7 10*3/uL (ref 1.7–7.7)
Neutrophils Relative %: 70 %
Platelets: 157 10*3/uL (ref 150–400)
RBC: 3.17 MIL/uL — ABNORMAL LOW (ref 3.87–5.11)
RDW: 11.3 % — ABNORMAL LOW (ref 11.5–15.5)
WBC: 9.7 10*3/uL (ref 4.0–10.5)
nRBC: 0 % (ref 0.0–0.2)

## 2021-04-29 LAB — GLUCOSE, CAPILLARY
Glucose-Capillary: 93 mg/dL (ref 70–99)
Glucose-Capillary: 98 mg/dL (ref 70–99)
Glucose-Capillary: 145 mg/dL — ABNORMAL HIGH (ref 70–99)
Glucose-Capillary: 85 mg/dL (ref 70–99)

## 2021-04-29 LAB — CBC
HCT: 35.2 % — ABNORMAL LOW (ref 36.0–46.0)
Hemoglobin: 11.7 g/dL — ABNORMAL LOW (ref 12.0–15.0)
MCH: 33.8 pg (ref 26.0–34.0)
MCHC: 33.2 g/dL (ref 30.0–36.0)
MCV: 101.7 fL — ABNORMAL HIGH (ref 80.0–100.0)
Platelets: 154 10*3/uL (ref 150–400)
RBC: 3.46 MIL/uL — ABNORMAL LOW (ref 3.87–5.11)
RDW: 11.6 % (ref 11.5–15.5)
WBC: 6.2 10*3/uL (ref 4.0–10.5)
nRBC: 0 % (ref 0.0–0.2)

## 2021-04-29 LAB — URINALYSIS, ROUTINE W REFLEX MICROSCOPIC
Bacteria, UA: NONE SEEN
Bilirubin Urine: NEGATIVE
Glucose, UA: NEGATIVE mg/dL
Hgb urine dipstick: NEGATIVE
Ketones, ur: NEGATIVE mg/dL
Leukocytes,Ua: NEGATIVE
Nitrite: NEGATIVE
Protein, ur: 100 mg/dL — AB
Specific Gravity, Urine: 1.017 (ref 1.005–1.030)
pH: 6 (ref 5.0–8.0)

## 2021-04-29 LAB — COMPREHENSIVE METABOLIC PANEL
ALT: 16 U/L (ref 0–44)
ALT: 16 U/L (ref 0–44)
AST: 22 U/L (ref 15–41)
AST: 21 U/L (ref 15–41)
Albumin: 3.2 g/dL — ABNORMAL LOW (ref 3.5–5.0)
Albumin: 3.4 g/dL — ABNORMAL LOW (ref 3.5–5.0)
Alkaline Phosphatase: 33 U/L — ABNORMAL LOW (ref 38–126)
Alkaline Phosphatase: 31 U/L — ABNORMAL LOW (ref 38–126)
Anion gap: 11 (ref 5–15)
Anion gap: 9 (ref 5–15)
BUN: 24 mg/dL — ABNORMAL HIGH (ref 6–20)
BUN: 19 mg/dL (ref 6–20)
CO2: 22 mmol/L (ref 22–32)
CO2: 21 mmol/L — ABNORMAL LOW (ref 22–32)
Calcium: 9.3 mg/dL (ref 8.9–10.3)
Calcium: 9.1 mg/dL (ref 8.9–10.3)
Chloride: 101 mmol/L (ref 98–111)
Chloride: 105 mmol/L (ref 98–111)
Creatinine, Ser: 1.01 mg/dL — ABNORMAL HIGH (ref 0.44–1.00)
Creatinine, Ser: 0.86 mg/dL (ref 0.44–1.00)
GFR, Estimated: >60 mL/min (ref >60)
GFR, Estimated: >60 mL/min (ref >60)
Glucose, Bld: 156 mg/dL — ABNORMAL HIGH (ref 70–99)
Glucose, Bld: 96 mg/dL (ref 70–99)
Potassium: 3.6 mmol/L (ref 3.5–5.1)
Potassium: 3.5 mmol/L (ref 3.5–5.1)
Sodium: 134 mmol/L — ABNORMAL LOW (ref 135–145)
Sodium: 135 mmol/L (ref 135–145)
Total Bilirubin: 0.4 mg/dL (ref 0.3–1.2)
Total Bilirubin: 0.8 mg/dL (ref 0.3–1.2)
Total Protein: 6.4 g/dL — ABNORMAL LOW (ref 6.5–8.1)
Total Protein: 6.9 g/dL (ref 6.5–8.1)

## 2021-04-29 LAB — MAGNESIUM: Magnesium: 1.5 mg/dL — ABNORMAL LOW (ref 1.7–2.4)

## 2021-04-29 LAB — PHOSPHORUS: Phosphorus: 3.9 mg/dL (ref 2.5–4.6)

## 2021-04-29 LAB — SARS CORONAVIRUS 2 (TAT 6-24 HRS): SARS Coronavirus 2: NEGATIVE

## 2021-04-29 MED ORDER — LORAZEPAM 1 MG PO TABS: 1.0000 mg | ORAL_TABLET | ORAL | Status: DC | PRN

## 2021-04-29 MED ORDER — LEVETIRACETAM 750 MG PO TABS
750.0000 mg | ORAL_TABLET | Freq: Two times a day (BID) | ORAL | Status: DC
  Administered 2021-04-29 – 2021-04-30 (×2): 750 mg via ORAL
  Filled 2021-04-29 (×3): qty 1

## 2021-04-29 MED ORDER — ADULT MULTIVITAMIN W/MINERALS CH
1.0000 | ORAL_TABLET | Freq: Every day | ORAL | Status: DC
  Administered 2021-04-29 – 2021-04-30 (×2): 1 via ORAL
  Filled 2021-04-29 (×2): qty 1

## 2021-04-29 MED ORDER — LAMOTRIGINE 100 MG PO TABS
200.0000 mg | ORAL_TABLET | Freq: Two times a day (BID) | ORAL | Status: DC
  Administered 2021-04-29 – 2021-04-30 (×3): 200 mg via ORAL
  Filled 2021-04-29 (×3): qty 2

## 2021-04-29 MED ORDER — SERTRALINE HCL 100 MG PO TABS
100.0000 mg | ORAL_TABLET | Freq: Every day | ORAL | Status: DC
  Administered 2021-04-29 – 2021-04-30 (×2): 100 mg via ORAL
  Filled 2021-04-29 (×2): qty 1

## 2021-04-29 MED ORDER — THIAMINE HCL 100 MG PO TABS
100.0000 mg | ORAL_TABLET | Freq: Every day | ORAL | Status: DC
  Administered 2021-04-29 – 2021-04-30 (×2): 100 mg via ORAL
  Filled 2021-04-29 (×2): qty 1

## 2021-04-29 MED ORDER — THIAMINE HCL 100 MG/ML IJ SOLN: 100.0000 mg | Freq: Every day | INTRAMUSCULAR | Status: DC

## 2021-04-29 MED ORDER — FOLIC ACID 1 MG PO TABS
1.0000 mg | ORAL_TABLET | Freq: Every day | ORAL | Status: DC
  Administered 2021-04-30: 1 mg via ORAL
  Filled 2021-04-29: qty 1

## 2021-04-29 MED ORDER — FOLIC ACID 5 MG/ML IJ SOLN
1.0000 mg | Freq: Every day | INTRAMUSCULAR | Status: DC
  Administered 2021-04-29: 1 mg via INTRAVENOUS
  Filled 2021-04-29: qty 0.2

## 2021-04-29 MED ORDER — THIAMINE HCL 100 MG/ML IJ SOLN
100.0000 mg | Freq: Every day | INTRAMUSCULAR | Status: DC
  Filled 2021-04-29 (×2): qty 2

## 2021-04-29 MED ORDER — LORAZEPAM 2 MG/ML IJ SOLN: 1.0000 mg | INTRAMUSCULAR | Status: DC | PRN

## 2021-04-29 MED ORDER — POTASSIUM CHLORIDE CRYS ER 20 MEQ PO TBCR: 40.0000 meq | EXTENDED_RELEASE_TABLET | Freq: Once | ORAL | Status: DC

## 2021-04-29 MED ORDER — SODIUM CHLORIDE 0.9 % IV SOLN: INTRAVENOUS | Status: DC

## 2021-04-29 MED ORDER — MAGNESIUM SULFATE 4 GM/100ML IV SOLN
4.0000 g | Freq: Once | INTRAVENOUS | Status: AC
  Administered 2021-04-29: 4 g via INTRAVENOUS
  Filled 2021-04-29: qty 100

## 2021-04-29 MED ORDER — AMLODIPINE BESYLATE 5 MG PO TABS
5.0000 mg | ORAL_TABLET | Freq: Every day | ORAL | Status: DC
  Administered 2021-04-29 – 2021-04-30 (×2): 5 mg via ORAL
  Filled 2021-04-29 (×2): qty 1

## 2021-04-29 MED ORDER — POTASSIUM CHLORIDE CRYS ER 20 MEQ PO TBCR
40.0000 meq | EXTENDED_RELEASE_TABLET | Freq: Once | ORAL | Status: AC
  Administered 2021-04-29: 40 meq via ORAL
  Filled 2021-04-29: qty 2

## 2021-04-29 NOTE — Plan of Care (Signed)
°  Problem: Safety: °Goal: Verbalization of understanding the information provided will improve °Outcome: Progressing °  °

## 2021-04-29 NOTE — Plan of Care (Signed)
  Problem: Education: Goal: Expressions of having a comfortable level of knowledge regarding the disease process will increase Outcome: Progressing   

## 2021-04-29 NOTE — Progress Notes (Signed)
          Date: 04/29/2021   To whom it may concern,  Ms Tabak was seen by me at [facility] on 04/29/2021. She has a medical condition which can worsen with changes in sleep schedule/ shift work. Therefore, I wold recommend avoiding changes in her shift time as much as possible and preferable avoiding shifts later in the day.   Please do not hesitate to contact me if needed for any further questions.  Dr Lindie Spruce [facility] [phone]

## 2021-04-29 NOTE — Progress Notes (Signed)
PROGRESS NOTE    Donna Short  VWU:[id number] DOB: June 27, 1991 DOA: 04/28/2021 PCP: Orland MustardWolfe, Allison, MD (Confirm with patient/family/NH records and if not entered, [facility]. "No PCP" if truly none.)   Chief Complaint  Patient presents with   Seizures    Brief Narrative:  Patient 30 year old female history of seizures, hypertension, prior history of breast brought to the ED as patient found to have seizure-like activity.  Head CT done unremarkable.  Neurology consulted and patient loaded with Keppra and subsequently placed on maintenance IV Keppra and Vimpat.  Overnight EEG placed.   Assessment & Plan:   Principal Problem:   Seizure (HCC) Active Problems:   Benign essential HTN   PRES (posterior reversible encephalopathy syndrome)   Hypomagnesemia   Hyperkalemia  #1 seizures -Patient presented with seizures and noted to be confused on admission and had an episode in the ED concerning for focal seizures in right upper extremity. -Patient loaded with IV Keppra and subsequently placed on Keppra 1500 mg IV every 12 hours as well as received Vimpat load and placed on maintenance Vimpat 100 mg IV every 12 hours. -Head CT done negative. -Overnight EEG with evidence of epileptogenicity arising from left posterior temporal region as well as cortical dysfunction in the left hemisphere due to underlying structural abnormality, postictal state, evidence of moderate diffuse encephalopathy, nonspecific etiology but likely related to seizure.  No definite seizures noted. -Check MRI brain. -Patient more alert and as such we will place on a diet. -Neurology following and recommending to resume home dose of lamotrigine 200 mg twice daily, increasing home dose Keppra to 750 mg twice daily. -Seizure precautions. -Keep potassium approximately at 4, magnesium approximately at 2. -Magnesium of 1.5, replete -Per neurology.  2.  Hypertension -Resume home regimen  Norvasc. -IV hydralazine as needed.  3.  History of alcohol use -Continue the Ativan withdrawal protocol. -Thiamine, multivitamin, folic acid.  4.  Prior history of PRES syndrome  5.  Hyperkalemia -Resolved.  6.  Hypomagnesemia -Magnesium at 1.5. -Magnesium sulfate 4 g IV x1. -Repeat labs in the morning.  7.  History of depression -Resume home regimen Zoloft.   DVT prophylaxis: SCDs Code Status: Full Family Communication: Updated patient.  Updated mother via speaker phone during assessment of patient. Disposition:   Status is: Observation  The patient will require care spanning > 2 midnights and should be moved to inpatient because: Inpatient level of care appropriate due to severity of illness  Dispo: The patient is from: Home              Anticipated d/c is to: Home              Patient currently is not medically stable to d/c.   Difficult to place patient No       Consultants:  Neurology: Dr. Selina CooleyStack 04/28/2021  Procedures:  CT head 12/26/2020 MRI head pending Overnight EEG   Antimicrobials:  None   Subjective: Patient laying in bed with the EEG leads on.  Alert oriented to self place and time.  Cannot remember the president's name.  Denies any chest pain or shortness of breath.  Asking when she is going to be able to go home.  States she was compliant with her antiseizure medications.  Hungry, asking for food.  Objective: Vitals:   04/28/21 2230 04/28/21 2300 04/29/21 0400 04/29/21 1216  BP: (!) 129/93 (!) 139/100 (!) 134/100 (!) 134/102  Pulse: (!) 102 88  85 93  Resp: (!) 23 20 16 18   Temp:  98.3 F (36.8 C) 98 F (36.7 C) 98.2 F (36.8 C)  TempSrc:  Oral Axillary Oral  SpO2: 99% 100% 100% 100%  Weight:        Intake/Output Summary (Last 24 hours) at 04/29/2021 1720 Last data filed at 04/29/2021 0600 Gross per 24 hour  Intake 259.18 ml  Output --  Net 259.18 ml   Filed Weights   04/28/21 1730  Weight: 51.7 kg    Examination:  General  exam: Appears calm and comfortable  Respiratory system: Clear to auscultation. Respiratory effort normal. Cardiovascular system: S1 & S2 heard, RRR. No JVD, murmurs, rubs, gallops or clicks. No pedal edema. Gastrointestinal system: Abdomen is nondistended, soft and nontender. No organomegaly or masses felt. Normal bowel sounds heard. Central nervous system: Alert and oriented. No focal neurological deficits. Extremities: Symmetric 5 x 5 power. Skin: No rashes, lesions or ulcers Psychiatry: Judgement and insight appear normal. Mood & affect appropriate.     Data Reviewed: I have personally reviewed following labs and imaging studies  CBC: Recent Labs  Lab 04/28/21 1434 04/29/21 0138 04/29/21 0908  WBC 7.0 9.7 6.2  NEUTROABS 4.2 6.7  --   HGB 13.0 10.9* 11.7*  HCT 39.1 31.6* 35.2*  MCV 104.5* 99.7 101.7*  PLT 172 157 154    Basic Metabolic Panel: Recent Labs  Lab 04/28/21 1434 04/29/21 0138 04/29/21 0908  NA 136 134* 135  K 5.9* 3.6 3.5  CL 101 101 105  CO2 25 22 21*  GLUCOSE 108* 156* 96  BUN 27* 24* 19  CREATININE 1.02* 1.01* 0.86  CALCIUM 9.8 9.3 9.1  MG  --   --  1.5*  PHOS  --   --  3.9    GFR: Estimated Creatinine Clearance: 74.3 mL/min (by C-G formula based on SCr of 0.86 mg/dL).  Liver Function Tests: Recent Labs  Lab 04/29/21 0138 04/29/21 0908  AST 22 21  ALT 16 16  ALKPHOS 33* 31*  BILITOT 0.4 0.8  PROT 6.4* 6.9  ALBUMIN 3.2* 3.4*    CBG: Recent Labs  Lab 04/28/21 1537 04/29/21 0355 04/29/21 0801 04/29/21 1530  GLUCAP 80 93 98 145*     Recent Results (from the past 240 hour(s))  SARS CORONAVIRUS 2 (TAT 6-24 HRS) Nasopharyngeal Nasopharyngeal Swab     Status: None   Collection Time: 04/28/21  9:18 PM   Specimen: Nasopharyngeal Swab  Result Value Ref Range Status   SARS Coronavirus 2 NEGATIVE NEGATIVE Final    Comment: (NOTE) SARS-CoV-2 target nucleic acids are NOT DETECTED.  The SARS-CoV-2 RNA is generally detectable in upper  and lower respiratory specimens during the acute phase of infection. Negative results do not preclude SARS-CoV-2 infection, do not rule out co-infections with other pathogens, and should not be used as the sole basis for treatment or other patient management decisions. Negative results must be combined with clinical observations, patient history, and epidemiological information. The expected result is Negative.  Fact Sheet for Patients: 06/28/21  Fact Sheet for Healthcare Providers: HairSlick.no  This test is not yet approved or cleared by the [employer]ir[employer]ir.com FDA and  has been authorized for detection and/or diagnosis of SARS-CoV-2 by FDA under an Emergency Use Authorization (EUA). This EUA will remain  in effect (meaning this test can be used) for the duration of the COVID-19 declaration under Se ction 564(b)(1) of the Act, 21 U.S.C. section 360bbb-3(b)(1), unless the authorization is terminated or  revoked sooner.  Performed at [facility], [address], [place], [place] [address]          Radiology Studies: CT HEAD WO CONTRAST ( )  Result Date: 04/28/2021 CLINICAL DATA:  Seizure, abnormal neuro exam. Additional provided: Focal seizure activity. EXAM: CT HEAD WITHOUT CONTRAST TECHNIQUE: Contiguous axial images were obtained from the base of the skull through the vertex without intravenous contrast. COMPARISON:  Brain MRI 10/22/2020. FINDINGS: Brain: Cerebral volume is normal. There is no acute intracranial hemorrhage. No acute demarcated cortical infarct. No extra-axial fluid collection. No evidence of an intracranial mass. No midline shift. Vascular: No hyperdense vessel. Skull: Normal. Negative for fracture or focal lesion. Sinuses/Orbits: Visualized orbits show no acute finding. No significant paranasal sinus disease at the imaged levels. IMPRESSION: No evidence of acute intracranial abnormality. Please refer  to the brain MRI of 10/22/2020 for a description of small regions of cortical/subcortical T2/FLAIR hyperintense signal abnormality demonstrated within the left parietal and occipital lobes on this prior exam. Electronically Signed   By: Jackey Loge D.O.   On: 04/28/2021 18:01   EEG adult  Result Date: 04/28/2021 [name], MD     04/28/2021  8:00 PM Patient Name: Donna Short MRN: [id number] Epilepsy Attending: [name] Referring Physician/Provider: Dr Bing Neighbors Date: 04/28/2021 Duration: 24.14 mins Patient history:  30yo F with history of multiple admissions with 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  She has persistent deficits in the ER, remains extremely confused, and has had at least 1 episode in the ED concerning for focal seizure in the right upper extremity since arrival. EEG to evaluate for seizure Level of alertness: Awake, asleep AEDs during EEG study: LEV, LCM Technical aspects: This EEG study was done with scalp electrodes positioned according to the 10-20 International system of electrode placement. Electrical activity was acquired at a sampling rate of 500Hz  and reviewed with a high frequency filter of 70Hz  and a low frequency filter of 1Hz . EEG data were recorded continuously and digitally stored. Description: No clear posterior dominant rhythm was seen. Sleep was characterized by sleep spindles (12 to 14 Hz), maximal frontocentral region. EEG showed abundant spikes in left posterior temporal region, maximal P7 at times periodic at 0.5-1Hz . There were also brief 1 seconds periods of eeg attenuation after a run of polyspikes. EEG also showed continuous  generalized and  lateralized left hemisphere 3 to 6 Hz theta-delta slowing with overriding 15-18hz  beta activity. Hyperventilation and photic stimulation were not performed.   ABNORMALITY - Spike, left posterior temporal region - Continuous slow, generalized and lateralized left  IMPRESSION: This study showed  evidence of epileptogenicity arising from left posterior temporal region as well as cortical dysfunction in left hemisphere due to underlying structural abnormality, post-ictal state. Additionally, there is evidence of moderate diffuse encephalopathy, nonspecific etiology but likely related to seizure.  Dr was notified    Overnight EEG with video  Result Date: 04/29/2021 Selina Cooley, MD     04/29/2021 12:58 PM Patient Name: Donna Short MRN: [name] Epilepsy Attending: 06/29/2021 Referring Physician/Provider: Dr Donna Curls Duration: 04/28/2021 1956 to 04/29/2021 1138  Patient history:  30yo F with history of multiple admissions with 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  She has persistent deficits in the ER, remains extremely confused, and has had at least 1 episode in the ED concerning for focal seizure in the right upper extremity since arrival. EEG to evaluate for seizure  Level of alertness: Awake, asleep  AEDs during EEG study: LEV, LCM  Technical aspects: This EEG study was done with scalp electrodes positioned according to the 10-20 International system of electrode placement. Electrical activity was acquired at a sampling rate of 500Hz  and reviewed with a high frequency filter of 70Hz  and a low frequency filter of 1Hz . EEG data were recorded continuously and digitally stored.  Description: No clear posterior dominant rhythm was seen. Sleep was characterized by sleep spindles (12 to 14 Hz), maximal frontocentral region. EEG showed abundant spikes in left posterior temporal region, maximal P7 at times periodic at 0.5-1Hz . There were also brief 1 seconds periods of eeg attenuation after a run of polyspikes. EEG also showed continuous  generalized and  lateralized left hemisphere 3 to 6 Hz theta-delta slowing with overriding 15-18hz  beta activity. Hyperventilation and photic stimulation were not performed.    ABNORMALITY - Spike, left posterior temporal  region - Continuous slow, generalized and lateralized left  IMPRESSION: This study showed evidence of epileptogenicity arising from left posterior temporal region as well as cortical dysfunction in left hemisphere due to underlying structural abnormality, post-ictal state. Additionally, there is evidence of moderate diffuse encephalopathy, nonspecific etiology but likely related to seizure. No definite seizures were noted  Priyanka        Scheduled Meds:  amLODipine  5 mg Oral Daily   [START ON 04/30/2021] folic acid  1 mg Oral Daily   lamoTRIgine  200 mg Oral BID   levETIRAcetam  750 mg Oral BID   multivitamin with minerals  1 tablet Oral Daily   sertraline  100 mg Oral Daily   thiamine injection  100 mg Intravenous Daily   thiamine  100 mg Oral Daily   Or   thiamine  100 mg Intravenous Daily   Continuous Infusions:  sodium chloride 125 mL/hr at 04/29/21 0841     LOS: 0 days    Time spent: 40 minutes    [name], MD [facility]   To contact the attending provider between 7A-7P or the covering provider during after hours 7P-7A, please log into the web site [email or web address] and access using universal [place] password for that web site. If you do not have the password, please call the hospital operator.  04/29/2021, 5:20 PM

## 2021-04-29 NOTE — Progress Notes (Signed)
D/C. [facility] notified. No skin breakdown. Patient just had got done eating lunch.

## 2021-04-29 NOTE — Progress Notes (Signed)
Subjective: No seizures overnight.  Patient states she feels like she is back to baseline.  Reports having trouble at work because her shift was recently switched from morning to afternoon.  States she does not forget to take her medications but has had trouble sleeping occasionally because of the change in timings of her job as well as increased workload.  Denies recent alcohol use  ROS: negative except above  Examination  Vital signs in last 24 hours: Temp:  [98 F (36.7 C)-99 F (37.2 C)] 98.2 F (36.8 C) (09/05 1216) Pulse Rate:  [78-128] 93 (09/05 1216) Resp:  [16-25] 18 (09/05 1216) BP: (128-173)/(93-129) 134/102 (09/05 1216) SpO2:  [97 %-100 %] 100 % (09/05 1216) Weight:  [51.7 kg] 51.7 kg (09/04 1730)  General: lying in bed, NAD CVS: pulse-normal rate and rhythm RS: breathing comfortably Extremities: normal, warm  Neuro: MS: Alert, oriented, follows commands CN: pupils equal and reactive,  EOMI, face symmetric, tongue midline, normal sensation over face, Motor: 5/5 strength in all 4 extremities Reflexes: 2+ bilaterally over patella, biceps, plantars: flexor Coordination: normal Gait: not tested  Basic Metabolic Panel: Recent Labs  Lab 04/28/21 1434 04/29/21 0138 04/29/21 0908  NA 136 134* 135  K 5.9* 3.6 3.5  CL 101 101 105  CO2 25 22 21*  GLUCOSE 108* 156* 96  BUN 27* 24* 19  CREATININE 1.02* 1.01* 0.86  CALCIUM 9.8 9.3 9.1  MG  --   --  1.5*  PHOS  --   --  3.9    CBC: Recent Labs  Lab 04/28/21 1434 04/29/21 0138 04/29/21 0908  WBC 7.0 9.7 6.2  NEUTROABS 4.2 6.7  --   HGB 13.0 10.9* 11.7*  HCT 39.1 31.6* 35.2*  MCV 104.5* 99.7 101.7*  PLT 172 157 154     Coagulation Studies: No results for input(s): LABPROT, INR in the last 72 hours.  Imaging CT head without contrast 04/28/2021: No acute abnormality.  ASSESSMENT AND PLAN: 30 year old female with history of epilepsy who presented with breakthrough seizures   Epilepsy with breakthrough  seizures -Unclear etiology of breakthrough seizures.  No infection. Unfortunately, drug levels were not drawn prior to administering loading doses therefore difficult to assess compliance.  However patient does report taking medications as prescribed daily.  Also patient has history of alcohol use but on arrival alcohol level was less than 10.   Recommendations -Resume home dose of lamotrigine 200 mg twice daily -Per review of Dr. Zannie Cove note on 02/21/2021, Keppra was reduced to 500 mg twice daily.  Because of the breakthrough seizure, would recommend increasing back up to keppra 750 mg twice daily. -MRI brain without contrast ordered and pending to assess for any acute abnormality -Discussed seizure provoking factors including lack of sleep, alcohol use, medication noncompliance, infections. -Patient also requested a letter for her employer to switch back to morning shift if possible.  I do agree that seizures can be provoked due to frequent changes in sleep pattern and would be happy to provide a letter. -Continue seizure precautions including do not drive for 3 months -Follow-up with Dr. Terrace Arabia already scheduled for 09/02/2021 at 1130am.  Discussed with patient to call Dr. Zannie Cove office if she has any medication side effects, further breakthrough seizures or sooner follow-up is needed -Management of rest of comorbidities per primary team.   -If MRI brain is within normal limits, patient will be ready for discharge from neurology standpoint.  Discussed my plan with Dr. Janee Morn  Seizure precautions: Per Roderic Ovens  [place] DMV statutes, patients with seizures are not allowed to drive until they have been seizure-free for six months and cleared by a physician    Use caution when using heavy equipment or power tools. Avoid working on ladders or at heights. Take showers instead of baths. Ensure the water temperature is not too high on the home water heater. Do not go swimming alone. Do not lock yourself in a room  alone (i.e. bathroom). When caring for infants or small children, sit down when holding, feeding, or changing them to minimize risk of injury to the child in the event you have a seizure. Maintain good sleep hygiene. Avoid alcohol.    If patient has another seizure, call 911 and bring them back to the ED if: A.  The seizure lasts longer than 5 minutes.      B.  The patient doesn't wake shortly after the seizure or has new problems such as difficulty seeing, speaking or moving following the seizure C.  The patient was injured during the seizure D.  The patient has a temperature over 102 F (39C) E.  The patient vomited during the seizure and now is having trouble breathing    During the Seizure   - First, ensure adequate ventilation and place patients on the floor on their left side  Loosen clothing around the neck and ensure the airway is patent. If the patient is clenching the teeth, do not force the mouth open with any object as this can cause severe damage - Remove all items from the surrounding that can be hazardous. The patient may be oblivious to what's happening and may not even know what he or she is doing. If the patient is confused and wandering, either gently guide him/her away and block access to outside areas - Reassure the individual and be comforting - Call 911. In most cases, the seizure ends before EMS arrives. However, there are cases when seizures may last over 3 to 5 minutes. Or the individual may have developed breathing difficulties or severe injuries. If a pregnant patient or a person with diabetes develops a seizure, it is prudent to call an ambulance. - Finally, if the patient does not regain full consciousness, then call EMS. Most patients will remain confused for about 45 to 90 minutes after a seizure, so you must use judgment in calling for help. - Avoid restraints but make sure the patient is in a bed with padded side rails - Place the individual in a lateral position  with the neck slightly flexed; this will help the saliva drain from the mouth and prevent the tongue from falling backward - Remove all nearby furniture and other hazards from the area - Provide verbal assurance as the individual is regaining consciousness - Provide the patient with privacy if possible - Call for help and start treatment as ordered by the caregiver    After the Seizure (Postictal Stage)   After a seizure, most patients experience confusion, fatigue, muscle pain and/or a headache. Thus, one should permit the individual to sleep. For the next few days, reassurance is essential. Being calm and helping reorient the person is also of importance.   Most seizures are painless and end spontaneously. Seizures are not harmful to others but can lead to complications such as stress on the lungs, brain and the heart. Individuals with prior lung problems may develop labored breathing and respiratory distress.   I have spent a total of  40 minutes with the patient reviewing  hospital notes,  test results, labs and examining the patient as well as establishing an assessment and plan that was discussed personally with the patient.  > 50% of time was spent in direct patient care.    Lindie Spruce Epilepsy [facility] For questions after 5pm please refer to AMION to reach the Neurologist on call

## 2021-04-29 NOTE — Procedures (Signed)
Patient Name: Donna Short  MRN: [id number]  Epilepsy Attending: [name]  Referring Physician/Provider: Dr Bing Neighbors Duration: 04/28/2021 1956 to 04/29/2021 1138   Patient history:  30yo F with history of multiple admissions with 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  She has persistent deficits in the ER, remains extremely confused, and has had at least 1 episode in the ED concerning for focal seizure in the right upper extremity since arrival. EEG to evaluate for seizure   Level of alertness: Awake, asleep   AEDs during EEG study: LEV, LCM   Technical aspects: This EEG study was done with scalp electrodes positioned according to the 10-20 International system of electrode placement. Electrical activity was acquired at a sampling rate of 500Hz  and reviewed with a high frequency filter of 70Hz  and a low frequency filter of 1Hz . EEG data were recorded continuously and digitally stored.    Description: No clear posterior dominant rhythm was seen. Sleep was characterized by sleep spindles (12 to 14 Hz), maximal frontocentral region. EEG showed abundant spikes in left posterior temporal region, maximal P7 at times periodic at 0.5-1Hz . There were also brief 1 seconds periods of eeg attenuation after a run of polyspikes. EEG also showed continuous  generalized and  lateralized left hemisphere 3 to 6 Hz theta-delta slowing with overriding 15-18hz  beta activity. Hyperventilation and photic stimulation were not performed.      ABNORMALITY - Spike, left posterior temporal region - Continuous slow, generalized and lateralized left   IMPRESSION: This study showed evidence of epileptogenicity arising from left posterior temporal region as well as cortical dysfunction in left hemisphere due to underlying structural abnormality, post-ictal state. Additionally, there is evidence of moderate diffuse encephalopathy, nonspecific etiology but likely related to seizure. No definite  seizures were noted    Giancarlo Askren 

## 2021-04-29 NOTE — Progress Notes (Signed)
Performed EEG maintenance.  No skin breakdown observed at electrode sites Fp1, Fp2, P7. Tested patient event button.  Notified [facility] monitoring.

## 2021-04-30 LAB — CBC
HCT: 35 % — ABNORMAL LOW (ref 36.0–46.0)
Hemoglobin: 11.7 g/dL — ABNORMAL LOW (ref 12.0–15.0)
MCH: 34.1 pg — ABNORMAL HIGH (ref 26.0–34.0)
MCHC: 33.4 g/dL (ref 30.0–36.0)
MCV: 102 fL — ABNORMAL HIGH (ref 80.0–100.0)
Platelets: 171 10*3/uL (ref 150–400)
RBC: 3.43 MIL/uL — ABNORMAL LOW (ref 3.87–5.11)
RDW: 11.4 % — ABNORMAL LOW (ref 11.5–15.5)
WBC: 7.5 10*3/uL (ref 4.0–10.5)
nRBC: 0 % (ref 0.0–0.2)

## 2021-04-30 LAB — BASIC METABOLIC PANEL
Anion gap: 6 (ref 5–15)
BUN: 25 mg/dL — ABNORMAL HIGH (ref 6–20)
CO2: 23 mmol/L (ref 22–32)
Calcium: 9.1 mg/dL (ref 8.9–10.3)
Chloride: 105 mmol/L (ref 98–111)
Creatinine, Ser: 1.44 mg/dL — ABNORMAL HIGH (ref 0.44–1.00)
GFR, Estimated: 50 mL/min — ABNORMAL LOW (ref >60)
Glucose, Bld: 98 mg/dL (ref 70–99)
Potassium: 5 mmol/L (ref 3.5–5.1)
Sodium: 134 mmol/L — ABNORMAL LOW (ref 135–145)

## 2021-04-30 LAB — GLUCOSE, CAPILLARY
Glucose-Capillary: 115 mg/dL — ABNORMAL HIGH (ref 70–99)
Glucose-Capillary: 127 mg/dL — ABNORMAL HIGH (ref 70–99)

## 2021-04-30 LAB — MAGNESIUM: Magnesium: 2.3 mg/dL (ref 1.7–2.4)

## 2021-04-30 MED ORDER — LEVETIRACETAM 750 MG PO TABS: 750.0000 mg | ORAL_TABLET | Freq: Two times a day (BID) | ORAL | 1 refills | Status: DC

## 2021-04-30 MED ORDER — THIAMINE HCL 100 MG PO TABS: 100.0000 mg | ORAL_TABLET | Freq: Every day | ORAL | Status: DC

## 2021-04-30 NOTE — Discharge Summary (Signed)
Physician Discharge Summary  Donna Short ZOX:[id number] DOB: 09-03-1990 DOA: 04/28/2021  PCP: Orland Mustard, MD  Admit date: 04/28/2021 Discharge date: 04/30/2021  Time spent: 55 minutes  Recommendations for Outpatient Follow-up:  Follow-up with Dr. Terrace Arabia, neurology in 2 weeks. Follow-up with Orland Mustard, MD in 2 to 3 weeks.  On follow-up patient will need a basic metabolic profile, magnesium level done to follow-up on electrolytes and renal function.    Discharge Diagnoses:  Principal Problem:   Seizure [facility]) Active Problems:   Benign essential HTN   PRES (posterior reversible encephalopathy syndrome)   Hypomagnesemia   Hyperkalemia   Discharge Condition: Stable and improved  Diet recommendation: Regular  Filed Weights   04/28/21 1730  Weight: 51.7 kg    History of present illness:  HPI per Dr. Nicholaus Bloom Donna Short is a 30 y.o. female with history of seizures and hypertension and prior history of PRESS was brought to the ER after patient was found to have a seizure-like activity.  Patient was behaving strangely at workplace and speaking nonsensical.  EMS arrived and found the patient was nonverbal and had some rapid eye movement.  Patient was brought to the ER.   ED Course: In the ER patient is found to have weakness of the right upper and lower extremities at times had spasms of the right and lower extremities.  CT head was unremarkable.  Urine drug screen was negative.  Patient was afebrile.  Neurologist on-call was consulted patient was loaded with Keppra and started on IV Keppra and Vimpat.  Admitted for further management of her seizures.  Patient at arrival was lethargic.  On my exam patient is still mildly confused but following commands moving all extremities.  Labs show macrocytosis.  Potassium was 5.9 creatinine 1.02 EKG shows normal sinus rhythm COVID test is pending.  Hospital Course:  #1 seizures -Patient presented with seizures and noted to be  confused on admission and had an episode in the ED concerning for focal seizures in right upper extremity. -Patient loaded with IV Keppra and subsequently placed on Keppra 1500 mg IV every 12 hours as well as received Vimpat load and placed on maintenance Vimpat 100 mg IV every 12 hours. -Head CT done negative. -Overnight EEG with evidence of epileptogenicity arising from left posterior temporal region as well as cortical dysfunction in the left hemisphere due to underlying structural abnormality, postictal state, evidence of moderate diffuse encephalopathy, nonspecific etiology but likely related to seizure.  No definite seizures noted. -RI brain with very subtle focus of cortical T2/FLAIR hyperintense signal abnormality within the left parietal lobe which was previously present at the site on brain MRI from 10/22/2020 and likely reflects a small focus of chronic cortical encephalomalacia from prior episode of PRES.  Unchanged punctuate chronic microhemorrhages within the right frontal and parietal lobes.  Otherwise unremarkable noncontrast MRI appearance of the brain.   -Patient improved clinically was started on a diet which she tolerated.  -Patient more alert and as such we will place on a diet. -Neurology was consulted and admission and follow the patient. -Neurology recommended to resume home dose of lamotrigine 200 mg twice daily, increasing home dose Keppra to 750 mg twice daily patient was started on during the hospitalization and tolerated. -Patient had no further seizures during the hospitalization and was discharged home in stable and improved condition. -Outpatient follow-up with primary neurologist.  2.  Hypertension -Patient was maintained on home regimen Norvasc.    3.  History of alcohol  use -Patient maintained on Ativan withdrawal protocol.   -Patient did not go through any bouts of withdrawal.   -Patient also maintained on thiamine, multivitamin, folic acid.   -Outpatient  follow-up.    4.  Prior history of PRES syndrome  5.  Hyperkalemia -Resolved.  6.  Hypomagnesemia -Repleted during the hospitalization.   -Outpatient follow-up.    7.  History of depression -Patient maintained on home regimen Zoloft.    Procedures: CT head 12/26/2020 MRI head 04/29/2021 Overnight EEG  Consultations: Neurology: Dr. Selina CooleyStack 04/28/2021    Discharge Exam: Vitals:   04/29/21 2202 04/30/21 0459  BP: (!) 142/112 116/85  Pulse: 92 82  Resp: 16 14  Temp: 98 F (36.7 C) 98.4 F (36.9 C)  SpO2: 100% 100%    General: NAD Cardiovascular: Regular rate rhythm no murmurs rubs or gallops.  No JVD.  No lower extremity edema. Respiratory: Lungs clear to auscultation bilaterally.  No wheezes, no crackles, no rhonchi.  Discharge Instructions   Discharge Instructions     Diet general   Complete by: As directed    Discharge instructions   Complete by: As directed    Seizure precautions: [place] [facility] [place] statutes, patients with seizures are not allowed to drive until they have been seizure-free for six months and cleared by a physician    Use caution when using heavy equipment or power tools. Avoid working on ladders or at heights. Take showers instead of baths. Ensure the water temperature is not too high on the home water heater. Do not go swimming alone. Do not lock yourself in a room alone (i.e. bathroom). When caring for infants or small children, sit down when holding, feeding, or changing them to minimize risk of injury to the child in the event you have a seizure. Maintain good sleep hygiene. Avoid alcohol.   Increase activity slowly   Complete by: As directed    Increase activity slowly   Complete by: As directed       Allergies as of 04/30/2021       Reactions   Claritin [loratadine] Itching   Peanut-containing Drug Products Other (See Comments)   Family not sure of reaction   Benadryl [diphenhydramine Hcl (sleep)] Rash   Latex Rash   Shrimp  [shellfish Allergy] Hives, Rash        Medication List     TAKE these medications    amLODipine 5 MG tablet Commonly known as: NORVASC TAKE ONE TABLET BY MOUTH DAILY   folic acid 1 MG tablet Commonly known as: FOLVITE TAKE ONE TABLET BY MOUTH DAILY   lamoTRIgine 200 MG tablet Commonly known as: LaMICtal Take 1 tablet (200 mg total) by mouth 2 (two) times daily.   levETIRAcetam 750 MG tablet Commonly known as: KEPPRA Take 1 tablet (750 mg total) by mouth 2 (two) times daily. What changed:  medication strength how much to take   multivitamin with minerals Tabs tablet Place 1 tablet into feeding tube daily. What changed: how to take this   sertraline 100 MG tablet Commonly known as: ZOLOFT Take 1 tablet (100 mg total) by mouth daily.   thiamine 100 MG tablet Take 1 tablet (100 mg total) by mouth daily. Start taking on: May 01, 2021       Allergies  Allergen Reactions   Claritin [Loratadine] Itching   Peanut-Containing Drug Products Other (See Comments)    Family not sure of reaction   Benadryl [Diphenhydramine Hcl (Sleep)] Rash   Latex Rash  Shrimp [Shellfish Allergy] Hives and Rash    Follow-up Information     Orland Mustard, MD. Schedule an appointment as soon as possible for a visit in 2 week(s).   Specialty: Family Medicine Why: f/u 2-3 weeks. Contact information: [address] [place] [address] [phone]         Levert Feinstein, MD. Schedule an appointment as soon as possible for a visit in 2 week(s).   Specialty: Neurology Contact information: [address] [place] [address] [phone]                  The results of significant diagnostics from this hospitalization (including imaging, microbiology, ancillary and laboratory) are listed below for reference.    Significant Diagnostic Studies: CT HEAD WO CONTRAST ( )  Result Date: 04/28/2021 CLINICAL DATA:  Seizure, abnormal neuro exam. Additional  provided: Focal seizure activity. EXAM: CT HEAD WITHOUT CONTRAST TECHNIQUE: Contiguous axial images were obtained from the base of the skull through the vertex without intravenous contrast. COMPARISON:  Brain MRI 10/22/2020. FINDINGS: Brain: Cerebral volume is normal. There is no acute intracranial hemorrhage. No acute demarcated cortical infarct. No extra-axial fluid collection. No evidence of an intracranial mass. No midline shift. Vascular: No hyperdense vessel. Skull: Normal. Negative for fracture or focal lesion. Sinuses/Orbits: Visualized orbits show no acute finding. No significant paranasal sinus disease at the imaged levels. IMPRESSION: No evidence of acute intracranial abnormality. Please refer to the brain MRI of 10/22/2020 for a description of small regions of cortical/subcortical T2/FLAIR hyperintense signal abnormality demonstrated within the left parietal and occipital lobes on this prior exam. Electronically Signed   By: Jackey Loge D.O.   On: 04/28/2021 18:01   MR BRAIN WO CONTRAST  Result Date: 04/29/2021 CLINICAL DATA:  Seizure, nontraumatic. EXAM: MRI HEAD WITHOUT CONTRAST TECHNIQUE: Multiplanar, multiecho pulse sequences of the brain and surrounding structures were obtained without intravenous contrast. COMPARISON:  Head CT 04/28/2021.  Brain MRI 10/22/2020. FINDINGS: Brain: Cerebral volume is normal. Very subtle focus of cortical T2/FLAIR hyperintense signal abnormality within the left parietal lobe (for instance as seen on series 11, image 17). Signal abnormality was previously present at the site on the brain MRI of 10/22/2020 and this likely reflects a small focus of cortical encephalomalacia from a prior episode of posterior reversible encephalopathy syndrome (PRES). Redemonstrated punctate chronic microhemorrhages within the posterior right frontal lobe and right parietal lobe. No other focal parenchymal signal abnormality is identified. The hippocampi are symmetric in size and signal.  There is no acute infarct. No evidence of an intracranial mass. No extra-axial fluid collection. No midline shift. Vascular: Maintained flow voids within the proximal large arterial vessels. Skull and upper cervical spine: No focal suspicious marrow lesion. Sinuses/Orbits: Visualized orbits show no acute finding. No significant paranasal sinus disease. IMPRESSION: Very subtle focus of cortical T2/FLAIR hyperintense signal abnormality within the left parietal lobe. Signal abnormality was previously present at the site on the brain MRI of 10/22/2020, and this likely reflects a small focus of chronic cortical encephalomalacia from a prior episode of posterior reversible encephalopathy syndrome (PRES). Unchanged punctate chronic microhemorrhages within the right frontal and parietal lobes. Otherwise unremarkable non-contrast MRI appearance of the brain. Electronically Signed   By: Jackey Loge D.O.   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  She has persistent deficits in the ER, remains extremely confused, and has had at least 1 episode in the ED concerning for focal seizure in the right upper extremity since arrival. EEG to evaluate for seizure Level of alertness: Awake, asleep AEDs during EEG study: LEV, LCM Technical aspects: This EEG study was done with scalp electrodes positioned according to the 10-20 International system of electrode placement. Electrical activity was acquired at a sampling rate of 500Hz  and reviewed with a high frequency filter of 70Hz  and a low frequency filter of 1Hz . EEG data were recorded continuously and digitally stored. Description: No clear  posterior dominant rhythm was seen. Sleep was characterized by sleep spindles (12 to 14 Hz), maximal frontocentral region. EEG showed abundant spikes in left posterior temporal region, maximal P7 at times periodic at 0.5-1Hz . There were also brief 1 seconds periods of eeg attenuation after a run of polyspikes. EEG also showed continuous  generalized and  lateralized left hemisphere 3 to 6 Hz theta-delta slowing with overriding 15-18hz  beta activity. Hyperventilation and photic stimulation were not performed.   ABNORMALITY - Spike, left posterior temporal region - Continuous slow, generalized and lateralized left  IMPRESSION: This study showed evidence of epileptogenicity arising from left posterior temporal region as well as cortical dysfunction in left hemisphere due to underlying structural abnormality, post-ictal state. Additionally, there is evidence of moderate diffuse encephalopathy, nonspecific etiology but likely related to seizure.  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  She has persistent deficits in the ER, remains extremely confused, and has had at least 1 episode in the ED concerning for focal seizure in the right upper extremity since arrival. EEG to evaluate for seizure  Level of alertness: Awake, asleep  AEDs during EEG study: LEV, LCM  Technical aspects: This EEG study was done with scalp electrodes positioned according to the 10-20 International system of electrode placement. Electrical activity was acquired at a sampling rate of 500Hz  and reviewed with a high frequency filter of 70Hz  and a low frequency filter  of 1Hz . EEG data were recorded continuously and digitally stored.  Description: No clear posterior dominant rhythm was seen. Sleep was characterized by sleep spindles (12 to 14 Hz), maximal frontocentral region. EEG showed abundant spikes in left posterior temporal region, maximal P7 at times periodic at 0.5-1Hz . There were also brief 1 seconds periods of eeg attenuation after a run of polyspikes. EEG also showed continuous  generalized and  lateralized left hemisphere 3 to 6 Hz theta-delta slowing with overriding 15-18hz  beta activity. Hyperventilation and photic stimulation were not performed.    ABNORMALITY - Spike, left posterior temporal region - Continuous slow, generalized and lateralized left  IMPRESSION: This study showed evidence of epileptogenicity arising from left posterior temporal region as well as cortical dysfunction in left hemisphere due to underlying structural abnormality, post-ictal state. Additionally, there is evidence of moderate diffuse encephalopathy, nonspecific etiology but likely related to seizure. No definite seizures were noted  Bing Neighbors    Microbiology:  Recent Results (from the past 240 hour(s))  SARS CORONAVIRUS 2 (TAT 6-24 HRS) Nasopharyngeal Nasopharyngeal Swab     Status: None   Collection Time: 04/28/21  9:18 PM   Specimen: Nasopharyngeal Swab  Result Value Ref Range Status   SARS Coronavirus 2 NEGATIVE NEGATIVE Final    Comment: (NOTE) SARS-CoV-2 target nucleic acids are NOT DETECTED.  The SARS-CoV-2 RNA is generally detectable in upper and lower respiratory specimens during the acute phase of infection. Negative results do not preclude SARS-CoV-2 infection, do not rule out co-infections with other pathogens, and should not be used as the sole basis for treatment or other patient management decisions. Negative results must be combined with clinical observations, patient history, and epidemiological information. The expected result is  Negative.  Fact Sheet for Patients: HairSlick.no  Fact Sheet for Healthcare Providers: quierodirigir.com  This test is not yet approved or cleared by the [place] FDA and  has been authorized for detection and/or diagnosis of SARS-CoV-2 by FDA under an Emergency Use Authorization (EUA). This EUA will remain  in effect (meaning this test can be used) for the duration of the COVID-19 declaration under Se ction 564(b)(1) of the Act, 21 U.S.C. section 360bbb-3(b)(1), unless the authorization is terminated or revoked sooner.  Performed at [facility], [address], [place], [place] [address]      Labs: Basic Metabolic Panel: Recent Labs  Lab 04/28/21 1434 04/29/21 0138 04/29/21 0908 04/30/21 0051  NA 136 134* 135 134*  K 5.9* 3.6 3.5 5.0  CL 101 101 105 105  CO2 25 22 21* 23  GLUCOSE 108* 156* 96 98  BUN 27* 24* 19 25*  CREATININE 1.02* 1.01* 0.86 1.44*  CALCIUM 9.8 9.3 9.1 9.1  MG  --   --  1.5* 2.3  PHOS  --   --  3.9  --    Liver Function Tests: Recent Labs  Lab 04/29/21 0138 04/29/21 0908  AST 22 21  ALT 16 16  ALKPHOS 33* 31*  BILITOT 0.4 0.8  PROT 6.4* 6.9  ALBUMIN 3.2* 3.4*   No results for input(s): LIPASE, AMYLASE in the last 168 hours. No results for input(s): AMMONIA in the last 168 hours. CBC: Recent Labs  Lab 04/28/21 1434 04/29/21 0138 04/29/21 0908 04/30/21 0051  WBC 7.0 9.7 6.2 7.5  NEUTROABS 4.2 6.7  --   --   HGB 13.0 10.9* 11.7* 11.7*  HCT 39.1 31.6* 35.2* 35.0*  MCV 104.5* 99.7 101.7* 102.0*  PLT 172 157 154 171   Cardiac Enzymes: No results for input(s): CKTOTAL, CKMB, CKMBINDEX, TROPONINI in the last 168 hours. BNP: BNP (last 3 results) No results for input(s): BNP in the last 8760 hours.  ProBNP (last 3 results) No results for input(s): PROBNP in the last 8760 hours.  CBG: Recent Labs  Lab 04/29/21 0801 04/29/21 1530 04/29/21 2208 04/30/21 0455  04/30/21 1159  GLUCAP 98 145* 85 115* 127*       Signed:  Ramiro Harvest MD.  [facility] 04/30/2021, 12:30 PM

## 2021-04-30 NOTE — TOC CAGE-AID Note (Signed)
Transition of Care [facility]) - CAGE-AID Screening   Patient Details  Name: Donna Short MRN: [id number] Date of Birth: 1991-06-14  [facility] CM/SW Contact:    Joanne Chars, LCSW Phone Number: 04/30/2021, 2:09 PM   Clinical Narrative: CSW met with pt to complete Cage Aid.  Pt reports she only drinks twice per week, 2 shots per time.  Pt denies drug use.  Pt denied that her ETOH use was causing any problems in her life but then reported that she has thought about stopping her ETOH use and has had periods of sobriety in the past.  Pt reports she does have history of siezures not related to ETOH withdrawels.  CSW provided pt with list out outpt and residential treatment providers.  Pt reports she has been in treatment in the past and it has been helpful.      CAGE-AID Screening:    Have You Ever Felt You Ought to Cut Down on Your Drinking or Drug Use?: Yes Have People Annoyed You By Critizing Your Drinking Or Drug Use?: Yes Have You Felt Bad Or Guilty About Your Drinking Or Drug Use?: Yes Have You Ever Had a Drink or Used Drugs First Thing In The Morning to Steady Your Nerves or to Get Rid of a Hangover?: No CAGE-AID Score: 3  Substance Abuse Education Offered: Yes  Substance abuse interventions: Patient Counseling, Other (must comment) Civil engineer, contracting)

## 2021-04-30 NOTE — Progress Notes (Signed)
Discharge instructions (including medications) discussed with and copy provided to patient/caregiver 

## 2021-04-30 NOTE — Plan of Care (Signed)
  Problem: Education: Goal: Expressions of having a comfortable level of knowledge regarding the disease process will increase Outcome: Adequate for Discharge

## 2021-05-03 ENCOUNTER — Ambulatory Visit (INDEPENDENT_AMBULATORY_CARE_PROVIDER_SITE_OTHER): Payer: Managed Care, Other (non HMO) | Admitting: Physician Assistant

## 2021-05-03 ENCOUNTER — Emergency Department (HOSPITAL_BASED_OUTPATIENT_CLINIC_OR_DEPARTMENT_OTHER): Payer: Managed Care, Other (non HMO)

## 2021-05-03 ENCOUNTER — Observation Stay (HOSPITAL_BASED_OUTPATIENT_CLINIC_OR_DEPARTMENT_OTHER)
Admission: EM | Admit: 2021-05-03 | Discharge: 2021-05-05 | Disposition: A | Discharge status: Home / Self Care | Payer: Managed Care, Other (non HMO) | Attending: Internal Medicine | Admitting: Internal Medicine

## 2021-05-03 ENCOUNTER — Other Ambulatory Visit: Payer: Self-pay

## 2021-05-03 ENCOUNTER — Encounter (HOSPITAL_BASED_OUTPATIENT_CLINIC_OR_DEPARTMENT_OTHER): Payer: Self-pay | Admitting: *Deleted

## 2021-05-03 ENCOUNTER — Encounter: Payer: Self-pay | Admitting: Physician Assistant

## 2021-05-03 VITALS — BP 159/125 | HR 133 | Temp 98.0°F | Ht 62.0 in | Wt 115.2 lb

## 2021-05-03 DIAGNOSIS — R519 Headache, unspecified: Secondary | ICD-10-CM | POA: Diagnosis present

## 2021-05-03 DIAGNOSIS — G40909 Epilepsy, unspecified, not intractable, without status epilepticus: Secondary | ICD-10-CM

## 2021-05-03 DIAGNOSIS — G40901 Epilepsy, unspecified, not intractable, with status epilepticus: Secondary | ICD-10-CM

## 2021-05-03 DIAGNOSIS — F339 Major depressive disorder, recurrent, unspecified: Secondary | ICD-10-CM

## 2021-05-03 DIAGNOSIS — R569 Unspecified convulsions: Secondary | ICD-10-CM | POA: Insufficient documentation

## 2021-05-03 DIAGNOSIS — Y900 Blood alcohol level of less than 20 mg/100 ml: Secondary | ICD-10-CM | POA: Diagnosis not present

## 2021-05-03 DIAGNOSIS — E871 Hypo-osmolality and hyponatremia: Secondary | ICD-10-CM | POA: Insufficient documentation

## 2021-05-03 DIAGNOSIS — Z79899 Other long term (current) drug therapy: Secondary | ICD-10-CM | POA: Insufficient documentation

## 2021-05-03 DIAGNOSIS — I160 Hypertensive urgency: Secondary | ICD-10-CM

## 2021-05-03 DIAGNOSIS — Z87891 Personal history of nicotine dependence: Secondary | ICD-10-CM | POA: Diagnosis not present

## 2021-05-03 DIAGNOSIS — J45909 Unspecified asthma, uncomplicated: Secondary | ICD-10-CM | POA: Diagnosis not present

## 2021-05-03 DIAGNOSIS — R4182 Altered mental status, unspecified: Secondary | ICD-10-CM

## 2021-05-03 DIAGNOSIS — G44209 Tension-type headache, unspecified, not intractable: Secondary | ICD-10-CM | POA: Diagnosis not present

## 2021-05-03 DIAGNOSIS — Z20822 Contact with and (suspected) exposure to covid-19: Secondary | ICD-10-CM | POA: Diagnosis not present

## 2021-05-03 DIAGNOSIS — I6783 Posterior reversible encephalopathy syndrome: Secondary | ICD-10-CM

## 2021-05-03 DIAGNOSIS — I10 Essential (primary) hypertension: Secondary | ICD-10-CM

## 2021-05-03 DIAGNOSIS — F1010 Alcohol abuse, uncomplicated: Secondary | ICD-10-CM

## 2021-05-03 DIAGNOSIS — R410 Disorientation, unspecified: Secondary | ICD-10-CM | POA: Diagnosis not present

## 2021-05-03 DIAGNOSIS — E162 Hypoglycemia, unspecified: Secondary | ICD-10-CM | POA: Insufficient documentation

## 2021-05-03 DIAGNOSIS — G9341 Metabolic encephalopathy: Principal | ICD-10-CM | POA: Insufficient documentation

## 2021-05-03 DIAGNOSIS — G9340 Encephalopathy, unspecified: Secondary | ICD-10-CM | POA: Diagnosis present

## 2021-05-03 DIAGNOSIS — Z91040 Latex allergy status: Secondary | ICD-10-CM | POA: Insufficient documentation

## 2021-05-03 LAB — RAPID URINE DRUG SCREEN, HOSP PERFORMED
Amphetamines: NOT DETECTED
Barbiturates: NOT DETECTED
Benzodiazepines: NOT DETECTED
Cocaine: NOT DETECTED
Opiates: NOT DETECTED
Tetrahydrocannabinol: NOT DETECTED

## 2021-05-03 LAB — COMPREHENSIVE METABOLIC PANEL
ALT: 19 U/L (ref 0–44)
AST: 31 U/L (ref 15–41)
Albumin: 5.2 g/dL — ABNORMAL HIGH (ref 3.5–5.0)
Alkaline Phosphatase: 45 U/L (ref 38–126)
Anion gap: 16 — ABNORMAL HIGH (ref 5–15)
BUN: 25 mg/dL — ABNORMAL HIGH (ref 6–20)
CO2: 21 mmol/L — ABNORMAL LOW (ref 22–32)
Calcium: 10.3 mg/dL (ref 8.9–10.3)
Chloride: 94 mmol/L — ABNORMAL LOW (ref 98–111)
Creatinine, Ser: 1.07 mg/dL — ABNORMAL HIGH (ref 0.44–1.00)
GFR, Estimated: >60 mL/min (ref >60)
Glucose, Bld: 66 mg/dL — ABNORMAL LOW (ref 70–99)
Potassium: 4.6 mmol/L (ref 3.5–5.1)
Sodium: 131 mmol/L — ABNORMAL LOW (ref 135–145)
Total Bilirubin: 0.6 mg/dL (ref 0.3–1.2)
Total Protein: 9.7 g/dL — ABNORMAL HIGH (ref 6.5–8.1)

## 2021-05-03 LAB — I-STAT VENOUS BLOOD GAS, ED
Acid-Base Excess: 0 mmol/L (ref 0.0–2.0)
Acid-base deficit: 1 mmol/L (ref 0.0–2.0)
Bicarbonate: 25.2 mmol/L (ref 20.0–28.0)
Bicarbonate: 22.4 mmol/L (ref 20.0–28.0)
Calcium, Ion: 1.25 mmol/L (ref 1.15–1.40)
Calcium, Ion: 1.24 mmol/L (ref 1.15–1.40)
HCT: 39 % (ref 36.0–46.0)
HCT: 37 % (ref 36.0–46.0)
Hemoglobin: 13.3 g/dL (ref 12.0–15.0)
Hemoglobin: 12.6 g/dL (ref 12.0–15.0)
O2 Saturation: 49 %
O2 Saturation: 97 %
Patient temperature: 68.6
Patient temperature: 98.8
Potassium: 4.5 mmol/L (ref 3.5–5.1)
Potassium: 3.9 mmol/L (ref 3.5–5.1)
Sodium: 132 mmol/L — ABNORMAL LOW (ref 135–145)
Sodium: 129 mmol/L — ABNORMAL LOW (ref 135–145)
TCO2: 26 mmol/L (ref 22–32)
TCO2: 23 mmol/L (ref 22–32)
pCO2, Ven: 20 mmHg — ABNORMAL LOW (ref 44.0–60.0)
pCO2, Ven: 33.2 mmHg — ABNORMAL LOW (ref 44.0–60.0)
pH, Ven: 7.636 (ref 7.250–7.430)
pH, Ven: 7.438 — ABNORMAL HIGH (ref 7.250–7.430)
pO2, Ven: <15 mmHg — CL (ref 32.0–45.0)
pO2, Ven: 90 mmHg — ABNORMAL HIGH (ref 32.0–45.0)

## 2021-05-03 LAB — CBG MONITORING, ED
Glucose-Capillary: 67 mg/dL — ABNORMAL LOW (ref 70–99)
Glucose-Capillary: 158 mg/dL — ABNORMAL HIGH (ref 70–99)
Glucose-Capillary: 58 mg/dL — ABNORMAL LOW (ref 70–99)
Glucose-Capillary: 191 mg/dL — ABNORMAL HIGH (ref 70–99)

## 2021-05-03 LAB — CBC WITH DIFFERENTIAL/PLATELET
Abs Immature Granulocytes: 0.03 10*3/uL (ref 0.00–0.07)
Basophils Absolute: 0 10*3/uL (ref 0.0–0.1)
Basophils Relative: 0 %
Eosinophils Absolute: 0 10*3/uL (ref 0.0–0.5)
Eosinophils Relative: 0 %
HCT: 40.7 % (ref 36.0–46.0)
Hemoglobin: 13.8 g/dL (ref 12.0–15.0)
Immature Granulocytes: 0 %
Lymphocytes Relative: 20 %
Lymphs Abs: 1.8 10*3/uL (ref 0.7–4.0)
MCH: 33.9 pg (ref 26.0–34.0)
MCHC: 33.9 g/dL (ref 30.0–36.0)
MCV: 100 fL (ref 80.0–100.0)
Monocytes Absolute: 0.7 10*3/uL (ref 0.1–1.0)
Monocytes Relative: 8 %
Neutro Abs: 6.5 10*3/uL (ref 1.7–7.7)
Neutrophils Relative %: 72 %
Platelets: 281 10*3/uL (ref 150–400)
RBC: 4.07 MIL/uL (ref 3.87–5.11)
RDW: 11.8 % (ref 11.5–15.5)
WBC: 9.1 10*3/uL (ref 4.0–10.5)
nRBC: 0 % (ref 0.0–0.2)

## 2021-05-03 LAB — URINALYSIS, ROUTINE W REFLEX MICROSCOPIC
Bilirubin Urine: NEGATIVE
Glucose, UA: NEGATIVE mg/dL
Hgb urine dipstick: NEGATIVE
Ketones, ur: NEGATIVE mg/dL
Leukocytes,Ua: NEGATIVE
Nitrite: NEGATIVE
Specific Gravity, Urine: 1.008 (ref 1.005–1.030)
pH: 7 (ref 5.0–8.0)

## 2021-05-03 LAB — RESP PANEL BY RT-PCR (FLU A&B, COVID) ARPGX2
Influenza A by PCR: NEGATIVE
Influenza B by PCR: NEGATIVE
SARS Coronavirus 2 by RT PCR: NEGATIVE

## 2021-05-03 LAB — AMMONIA: Ammonia: 25 umol/L (ref 9–35)

## 2021-05-03 LAB — LACTIC ACID, PLASMA: Lactic Acid, Venous: 1 mmol/L (ref 0.5–1.9)

## 2021-05-03 LAB — SALICYLATE LEVEL: Salicylate Lvl: <7 mg/dL — ABNORMAL LOW (ref 7.0–30.0)

## 2021-05-03 LAB — PREGNANCY, URINE: Preg Test, Ur: NEGATIVE

## 2021-05-03 LAB — MAGNESIUM: Magnesium: 1.6 mg/dL — ABNORMAL LOW (ref 1.7–2.4)

## 2021-05-03 LAB — ETHANOL: Alcohol, Ethyl (B): 11 mg/dL — ABNORMAL HIGH (ref <10)

## 2021-05-03 MED ORDER — THIAMINE HCL 100 MG PO TABS
100.0000 mg | ORAL_TABLET | Freq: Every day | ORAL | Status: DC
  Administered 2021-05-04 – 2021-05-05 (×2): 100 mg via ORAL
  Filled 2021-05-03 (×2): qty 1

## 2021-05-03 MED ORDER — AMLODIPINE BESYLATE 5 MG PO TABS
5.0000 mg | ORAL_TABLET | Freq: Every day | ORAL | Status: DC
  Administered 2021-05-04: 5 mg via ORAL
  Filled 2021-05-03: qty 1

## 2021-05-03 MED ORDER — SODIUM CHLORIDE 0.9% FLUSH
3.0000 mL | INTRAVENOUS | Status: DC | PRN
  Filled 2021-05-03: qty 3

## 2021-05-03 MED ORDER — LORAZEPAM 2 MG/ML IJ SOLN: 0.0000 mg | Freq: Four times a day (QID) | INTRAMUSCULAR | Status: DC

## 2021-05-03 MED ORDER — SODIUM CHLORIDE 0.9% FLUSH
3.0000 mL | Freq: Two times a day (BID) | INTRAVENOUS | Status: DC
  Administered 2021-05-04 (×3): 3 mL via INTRAVENOUS
  Filled 2021-05-03: qty 3

## 2021-05-03 MED ORDER — DEXTROSE 50 % IV SOLN
50.0000 mL | Freq: Once | INTRAVENOUS | Status: AC
  Administered 2021-05-03: 50 mL via INTRAVENOUS
  Filled 2021-05-03: qty 50

## 2021-05-03 MED ORDER — AMLODIPINE BESYLATE 5 MG PO TABS
5.0000 mg | ORAL_TABLET | Freq: Once | ORAL | Status: AC
  Administered 2021-05-03: 5 mg via ORAL
  Filled 2021-05-03: qty 1

## 2021-05-03 MED ORDER — LORAZEPAM 1 MG PO TABS
0.0000 mg | ORAL_TABLET | Freq: Four times a day (QID) | ORAL | Status: DC
  Administered 2021-05-04 (×3): 1 mg via ORAL
  Filled 2021-05-03 (×3): qty 1

## 2021-05-03 MED ORDER — LABETALOL HCL 5 MG/ML IV SOLN
5.0000 mg | Freq: Once | INTRAVENOUS | Status: AC
  Administered 2021-05-03: 5 mg via INTRAVENOUS
  Filled 2021-05-03: qty 4

## 2021-05-03 MED ORDER — SERTRALINE HCL 100 MG PO TABS
100.0000 mg | ORAL_TABLET | Freq: Every day | ORAL | Status: DC
  Administered 2021-05-04 – 2021-05-05 (×2): 100 mg via ORAL
  Filled 2021-05-03 (×2): qty 1

## 2021-05-03 MED ORDER — LORAZEPAM 1 MG PO TABS
1.0000 mg | ORAL_TABLET | Freq: Once | ORAL | Status: AC
  Administered 2021-05-03: 1 mg via ORAL

## 2021-05-03 MED ORDER — FOLIC ACID 1 MG PO TABS
1.0000 mg | ORAL_TABLET | Freq: Every day | ORAL | Status: DC
  Administered 2021-05-04 – 2021-05-05 (×2): 1 mg via ORAL
  Filled 2021-05-03 (×2): qty 1

## 2021-05-03 MED ORDER — MAGNESIUM OXIDE -MG SUPPLEMENT 400 (240 MG) MG PO TABS
400.0000 mg | ORAL_TABLET | Freq: Once | ORAL | Status: AC
  Administered 2021-05-03: 400 mg via ORAL
  Filled 2021-05-03: qty 1

## 2021-05-03 MED ORDER — LACTATED RINGERS IV BOLUS
1000.0000 mL | Freq: Once | INTRAVENOUS | Status: AC
  Administered 2021-05-03: 1000 mL via INTRAVENOUS

## 2021-05-03 MED ORDER — LORAZEPAM 1 MG PO TABS: 0.0000 mg | ORAL_TABLET | Freq: Two times a day (BID) | ORAL | Status: DC

## 2021-05-03 MED ORDER — LAMOTRIGINE 100 MG PO TABS
200.0000 mg | ORAL_TABLET | Freq: Two times a day (BID) | ORAL | Status: DC
  Administered 2021-05-04 – 2021-05-05 (×4): 200 mg via ORAL
  Filled 2021-05-03 (×4): qty 2

## 2021-05-03 MED ORDER — SODIUM CHLORIDE 0.9 % IV SOLN: 250.0000 mL | INTRAVENOUS | Status: DC | PRN

## 2021-05-03 MED ORDER — LEVETIRACETAM 750 MG PO TABS
750.0000 mg | ORAL_TABLET | Freq: Two times a day (BID) | ORAL | Status: DC
  Administered 2021-05-04 (×3): 750 mg via ORAL
  Filled 2021-05-03 (×3): qty 1

## 2021-05-03 MED ORDER — ADULT MULTIVITAMIN W/MINERALS CH
1.0000 | ORAL_TABLET | Freq: Every day | ORAL | Status: DC
  Administered 2021-05-04 – 2021-05-05 (×2): 1
  Filled 2021-05-03 (×2): qty 1

## 2021-05-03 MED ORDER — ENOXAPARIN SODIUM 40 MG/0.4ML IJ SOSY
40.0000 mg | PREFILLED_SYRINGE | Freq: Every day | INTRAMUSCULAR | Status: DC
  Administered 2021-05-05: 40 mg via SUBCUTANEOUS
  Filled 2021-05-03 (×2): qty 0.4

## 2021-05-03 MED ORDER — SODIUM CHLORIDE 0.9% FLUSH
3.0000 mL | Freq: Two times a day (BID) | INTRAVENOUS | Status: DC
  Administered 2021-05-04 (×3): 3 mL via INTRAVENOUS

## 2021-05-03 MED ORDER — LORAZEPAM 2 MG/ML IJ SOLN
0.0000 mg | Freq: Two times a day (BID) | INTRAMUSCULAR | Status: DC
Start: 2021-05-06 — End: 2021-05-05

## 2021-05-03 MED ORDER — THIAMINE HCL 100 MG/ML IJ SOLN
100.0000 mg | Freq: Once | INTRAMUSCULAR | Status: AC
  Administered 2021-05-03: 100 mg via INTRAVENOUS
  Filled 2021-05-03: qty 2

## 2021-05-03 MED ORDER — LORAZEPAM 1 MG PO TABS
0.0000 mg | ORAL_TABLET | Freq: Four times a day (QID) | ORAL | Status: DC
  Filled 2021-05-03: qty 1

## 2021-05-03 NOTE — ED Notes (Signed)
Pt ambulatory to bathroom with RN assistance.  Urine sample collected.  Pt tolerated well, walks without difficulty.

## 2021-05-03 NOTE — ED Notes (Signed)
Called [facility] to transport patient to [facility] 5W room 26

## 2021-05-03 NOTE — Plan of Care (Signed)

## 2021-05-03 NOTE — H&P (Signed)
History and Physical:    Donna Short   PVX:[id number] DOB: 04/21/1991 DOA: 05/03/2021  Referring MD/provider: Dr. Criss Alvine PCP: Allwardt, Crist Infante, PA-C   Patient coming from: Home, lives with family  Chief Complaint: Headache  History of Present Illness:   Donna Short is an 30 y.o. female with a past medical history of seizures and PRES syndrome who was hospitalized from 04/28/2021 - 04/30/2021 for treatment of seizure and altered mental status.  During that hospitalization, she was started on IV Keppra and Vimpat.  EEG confirmed epileptogenic focus in the left posterior temporal region as well as cortical dysfunction in the left hemisphere due to an underlying structural abnormality.  MRI scan showed a small focus of chronic cortical encephalomalacia and unchanged punctate chronic microhemorrhages within the right frontal and parietal lobes.  She was ultimately discharged home on a higher dose of Keppra and her home dose of lamotrigine after she remains seizure-free.  She saw her PCP in follow-up earlier today and during the visit, she complained of a left-sided headache and was noted to be confused.  She also had an elevated blood pressure and heart rate, so her PCP advised her to go to the ED.  ED Course: In the ED, her initial blood pressure was 122/96 with a heart rate of 92.  She remained confused and slow to follow directions.  CT of the head was negative for acute findings.  She was given a dose of Ativan and referred for further work-up and management.  ROS:   Review of Systems  Unable to perform ROS: Mental status change (Patient is confused.)  All other systems reviewed and are negative.  Past Medical History:   Past Medical History:  Diagnosis Date   Asthma    Depression    History of chicken pox    Hypertension    Migraines    Pancreatitis 01/2018   Seizures (HCC)     Past Surgical History:   Past Surgical History:  Procedure Laterality Date   WISDOM  TOOTH EXTRACTION      Social History:   Social History   Socioeconomic History   Marital status: Single    Spouse name: Not on file   Number of children: 0   Years of education: 2 years college   Highest education level: Not on file  Occupational History   Occupation: Deli at [employer]  Tobacco Use   Smoking status: Former    Types: Cigarettes    Quit date: 01/21/2018    Years since quitting: 3.2   Smokeless tobacco: Never  Vaping Use   Vaping Use: Never used  Substance and Sexual Activity   Alcohol use: Yes    Alcohol/week: 9.0 standard drinks    Types: 9 Shots of liquor per week    Comment: 02/2019 "  2 shots every 3 or 4 days "   Drug use: No   Sexual activity: Not Currently  Other Topics Concern   Not on file  Social History Narrative   Lives at home alone.   Right-handed.   No caffeine use.   Social Determinants of Health   Financial Resource Strain: Not on file  Food Insecurity: Not on file  Transportation Needs: Not on file  Physical Activity: Not on file  Stress: Not on file  Social Connections: Not on file  Intimate Partner Violence: Not on file    Allergies   Claritin [loratadine], Peanut-containing drug products, Benadryl [diphenhydramine hcl (sleep)], Latex, and  Shrimp [shellfish allergy]  Family history:   Family History  Problem Relation Age of Onset   Diabetes Father    Arthritis Father    Alcohol abuse Father    Breast cancer Mother    Asthma Mother    Drug abuse Mother    Hypertension Mother     Current Medications:   Prior to Admission medications   Medication Sig Start Date End Date Taking? Authorizing Provider  amLODipine (NORVASC) 5 MG tablet TAKE ONE TABLET BY MOUTH DAILY Patient taking differently: Take 5 mg by mouth daily. 01/22/21  Yes Orland Mustard, MD  folic acid (FOLVITE) 1 MG tablet TAKE ONE TABLET BY MOUTH DAILY Patient taking differently: Take 1 mg by mouth daily. 01/22/21  Yes Orland Mustard, MD  lamoTRIgine  (LAMICTAL) 200 MG tablet Take 1 tablet (200 mg total) by mouth 2 (two) times daily. 02/21/21  Yes Levert Feinstein, MD  levETIRAcetam (KEPPRA) 750 MG tablet Take 1 tablet (750 mg total) by mouth 2 (two) times daily. 04/30/21  Yes Rodolph Bong, MD  Multiple Vitamin (MULTIVITAMIN WITH MINERALS) TABS tablet Place 1 tablet into feeding tube daily. Patient taking differently: Take 1 tablet by mouth daily. 06/01/20  Yes Ollis, Brandi L, NP  sertraline (ZOLOFT) 100 MG tablet Take 1 tablet (100 mg total) by mouth daily. 02/21/21  Yes Levert Feinstein, MD  thiamine 100 MG tablet Take 1 tablet (100 mg total) by mouth daily. 05/01/21  Yes Rodolph Bong, MD    Physical Exam:   Vitals:   05/03/21 2017 05/03/21 2045 05/03/21 2100 05/03/21 2126  BP: (!) 144/104 (!) 141/95 (!) 122/96 (!) 122/96  Pulse:  97 93 92  Resp:  (!) 21 (!) 21 (!) 22  Temp:    99.6 F (37.6 C)  TempSrc:    Oral  SpO2:  100% 99% 100%  Weight:      Height:         Physical Exam: Blood pressure (!) 122/96, pulse 92, temperature 99.6 F (37.6 C), temperature source Oral, resp. rate (!) 22, height 5\' 2"  (1.575 m), weight 52.2 kg, SpO2 100 %. Gen: No acute distress.  Well-developed, well-nourished. Head: Normocephalic, atraumatic. Eyes: Pupils equal, round and reactive to light. Extraocular movements intact.  Sclerae nonicteric. No lid lag. Mouth: Oropharynx reveals moist mucous membranes. Dentition is good. Neck: Supple, no thyromegaly, no lymphadenopathy, no jugular venous distention. Chest: Lungs are clear to auscultation with good air movement. No rales, rhonchi or wheezes. CV: Heart sounds are regular with an S1, S2. No murmurs, rubs, clicks, or gallops.  Abdomen: Soft, nontender, nondistended with normal active bowel sounds. No hepatosplenomegaly or palpable masses. Extremities: Extremities are without clubbing, or cyanosis. No edema. Pedal pulses 2+.  Skin: Warm and dry. No rashes, lesions or wounds. Neuro: Alert and unable to  tell me where she is or what the date is today.  Unable to follow two-step commands consistently.  Slow to respond and responds inappropriately at times.  ? Aphasia. Strength 5/5.  No focal neurological deficits. Psych: Insight and judgment are impaired. Mood and affect flattened.   Data Review:    Labs: Basic Metabolic Panel: Recent Labs  Lab 04/28/21 1434 04/29/21 0138 04/29/21 0908 04/30/21 0051 05/03/21 1344 05/03/21 1714 05/03/21 1756 05/03/21 1858  NA 136 134* 135 134* 131*  --  132* 129*  K 5.9* 3.6 3.5 5.0 4.6  --  4.5 3.9  CL 101 101 105 105 94*  --   --   --  CO2 25 22 21* 23 21*  --   --   --   GLUCOSE 108* 156* 96 98 66*  --   --   --   BUN 27* 24* 19 25* 25*  --   --   --   CREATININE 1.02* 1.01* 0.86 1.44* 1.07*  --   --   --   CALCIUM 9.8 9.3 9.1 9.1 10.3  --   --   --   MG  --   --  1.5* 2.3  --  1.6*  --   --   PHOS  --   --  3.9  --   --   --   --   --    Liver Function Tests: Recent Labs  Lab 04/29/21 0138 04/29/21 0908 05/03/21 1344  AST 22 21 31   ALT 16 16 19   ALKPHOS 33* 31* 45  BILITOT 0.4 0.8 0.6  PROT 6.4* 6.9 9.7*  ALBUMIN 3.2* 3.4* 5.2*   Recent Labs  Lab 05/03/21 1714  AMMONIA 25   CBC: Recent Labs  Lab 04/28/21 1434 04/29/21 0138 04/29/21 0908 04/30/21 0051 05/03/21 1344 05/03/21 1756 05/03/21 1858  WBC 7.0 9.7 6.2 7.5 9.1  --   --   NEUTROABS 4.2 6.7  --   --  6.5  --   --   HGB 13.0 10.9* 11.7* 11.7* 13.8 13.3 12.6  HCT 39.1 31.6* 35.2* 35.0* 40.7 39.0 37.0  MCV 104.5* 99.7 101.7* 102.0* 100.0  --   --   PLT 172 157 154 171 281  --   --    C CBG: Recent Labs  Lab 04/30/21 1159 05/03/21 1651 05/03/21 1740 05/03/21 1814 05/03/21 1955  GLUCAP 127* 67* 58* 158* 191*    Urinalysis    Component Value Date/Time   COLORURINE COLORLESS (A) 05/03/2021 1714   APPEARANCEUR CLEAR 05/03/2021 1714   LABSPEC 1.008 05/03/2021 1714   PHURINE 7.0 05/03/2021 1714   GLUCOSEU NEGATIVE 05/03/2021 1714   HGBUR NEGATIVE  05/03/2021 1714   BILIRUBINUR NEGATIVE 05/03/2021 1714   KETONESUR NEGATIVE 05/03/2021 1714   PROTEINUR TRACE (A) 05/03/2021 1714   UROBILINOGEN 0.2 10/07/2015 1958   NITRITE NEGATIVE 05/03/2021 1714   LEUKOCYTESUR NEGATIVE 05/03/2021 1714      Radiographic Studies: CT HEAD WO CONTRAST (5MM)  Result Date: 05/03/2021 CLINICAL DATA:  Altered mental status.  Headache. EXAM: CT HEAD WITHOUT CONTRAST TECHNIQUE: Contiguous axial images were obtained from the base of the skull through the vertex without intravenous contrast. COMPARISON:  April 28, 2021. FINDINGS: Brain: No evidence of acute infarction, hemorrhage, hydrocephalus, extra-axial collection or mass lesion/mass effect. Vascular: No hyperdense vessel or unexpected calcification. Skull: Normal. Negative for fracture or focal lesion. Sinuses/Orbits: No acute finding. Other: None. IMPRESSION: No acute intracranial abnormality seen. Electronically Signed   By: Lupita RaiderJames  Green Jr M.D.   On: 05/03/2021 16:29    EKG: Independently reviewed.  Sinus tachycardia at 104 bpm.  No ST or T wave changes appreciated.   Assessment/Plan:   Principal Problem:   Acute metabolic encephalopathy in the setting of a known history of seizure disorder and prior history of PRES syndrome Case discussed with on-call neurologist, Dr. Wilford CornerArora who will see the patient in consultation.  CT of the head negative for acute findings.  Discussed need for repeat imaging with MRI and repeat EEG.  No change to antiepileptic medications at this time.  Active Problems:   Benign essential HTN Continue Norvasc.    Hypomagnesemia Repleted with  magnesium oxide.  Recheck magnesium in the morning.    Hyponatremia  Gently hydrate and monitor.    Hypoglycemia Uncertain etiology but may be related to seizures.  Will have nursing check her CBGs every 6 hours.    ETOH abuse Placed on Ativan per CIWA protocol.  Doubt this is withdrawal, but would continue for now.  Continue  thiamine and folic acid supplementation.    Depression, recurrent (HCC) Continue Zoloft.  Body mass index is 21.03 kg/m.  Other information:   DVT prophylaxis: Lovenox ordered. Code Status: Full code. Family Communication: No family currently at the bedside.  Mother is out of town. Disposition Plan: Home in 24-48 hours depending on work-up. Consults called: Neurology: Dr. Milon Dikes. Admission status: Observation.  The medical decision making on this patient was of high complexity and the patient is at high risk for clinical deterioration, therefore this is a level 3 visit.  Trula Ore Charlena Haub [facility]   How to contact the [facility] Attending or Consulting provider 7A - 7P or covering provider during after hours 7P -7A, for this patient?   Check the care team in [facility] and look for a) attending/consulting [facility] provider listed and b) the [facility] team listed Log into [email or web address] and use [place]'s universal password to access. If you do not have the password, please contact the hospital operator. Locate the [facility] provider you are looking for under [facility] and page to a number that you can be directly reached. If you still have difficulty reaching the provider, please page the [facility] (Director on Call) for the Hospitalists listed on amion for assistance.  05/03/2021, 11:17 PM

## 2021-05-03 NOTE — Progress Notes (Signed)
Established Patient Office Visit  Subjective:  Patient ID: Donna Short, female    DOB: [birth year]  Age: 30 y.o. MRN: [id number]  CC:  Chief Complaint  Patient presents with   Seizures    HPI Donna Short presents for hospital f/up s/p focal seizure right upper extremity.  She was admitted on 04/28/2021 and discharged on 04/30/2021.  She was discharged with instructions to follow-up with her neurologist, Dr. Ronny Flurry, as well as our office and to obtain follow-up electrolyte panels.  Discharge diagnoses included seizure, benign essential hypertension, PRES, hypomagnesemia, hyperkalemia.  She presents to the office today with her godfather,who drove her to the appointment today.  She complains of feeling weak and having a left-sided headache.  She also seems confused when trying to discuss history with her.  Past Medical History:  Diagnosis Date   Asthma    Depression    History of chicken pox    Hypertension    Migraines    Pancreatitis 01/2018   Seizures ([place])     Past Surgical History:  Procedure Laterality Date   WISDOM TOOTH EXTRACTION      Family History  Problem Relation Age of Onset   Diabetes Father    Arthritis Father    Alcohol abuse Father    Breast cancer Mother    Asthma Mother    Drug abuse Mother    Hypertension Mother     Social History   Socioeconomic History   Marital status: Single    Spouse name: Not on file   Number of children: 0   Years of education: 2 years college   Highest education level: Not on file  Occupational History   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   Right-handed.   No caffeine use.   Social Determinants of Health   Financial Resource Strain: Not on file  Food Insecurity: Not on file  Transportation Needs: Not on file  Physical Activity: Not on file  Stress: Not on file  Social Connections: Not on file  Intimate Partner Violence: Not on file    Outpatient Medications Prior to Visit  Medication Sig Dispense Refill   amLODipine (NORVASC) 5 MG tablet TAKE ONE TABLET BY MOUTH DAILY (Patient taking differently: Take 5 mg by mouth daily.) 90 tablet 0   folic acid (FOLVITE) 1 MG tablet TAKE ONE TABLET BY MOUTH DAILY (Patient taking differently: Take 1 mg by mouth daily.) 90 tablet 1   lamoTRIgine (LAMICTAL) 200 MG tablet Take 1 tablet (200 mg total) by mouth 2 (two) times daily. 180 tablet 4   levETIRAcetam (KEPPRA) 750 MG tablet Take 1 tablet (750 mg total) by mouth 2 (two) times daily. 60 tablet 1   Multiple Vitamin (MULTIVITAMIN WITH MINERALS) TABS tablet Place 1 tablet into feeding tube daily. (Patient taking differently: Take 1 tablet by mouth  daily.)     sertraline (ZOLOFT) 100 MG tablet Take 1 tablet (100 mg total) by mouth daily. 90 tablet 3   thiamine 100 MG tablet Take 1 tablet (100 mg total) by mouth daily.     No facility-administered medications prior to visit.    Allergies  Allergen Reactions   Claritin [Loratadine] Itching   Peanut-Containing Drug Products Other (See Comments)    Family not sure of reaction   Benadryl [Diphenhydramine Hcl (Sleep)] Rash   Latex Rash   Shrimp [Shellfish Allergy] Hives and Rash    ROS Review of Systems  Constitutional:  Negative for appetite change.  Eyes:  Negative for photophobia and visual disturbance.  Respiratory:  Negative for shortness of breath.   Cardiovascular:  Negative for chest pain.  Neurological:  Positive for weakness and headaches. Negative for  dizziness and numbness.  Psychiatric/Behavioral:  Negative for dysphoric mood. The patient is not nervous/anxious.      Objective:    Physical Exam Vitals and nursing note reviewed.  Constitutional:      General: She is not in acute distress.    Appearance: Normal appearance. She is normal weight.  HENT:     Head: Normocephalic.     Right Ear: External ear normal.     Left Ear: External ear normal.     Nose: Nose normal.     Mouth/Throat:     Mouth: Mucous membranes are moist.  Eyes:     Extraocular Movements: Extraocular movements intact.     Conjunctiva/sclera: Conjunctivae normal.     Pupils: Pupils are equal, round, and reactive to light.  Cardiovascular:     Rate and Rhythm: Normal rate and regular rhythm.     Pulses: Normal pulses.     Heart sounds: No murmur heard. Pulmonary:     Effort: Pulmonary effort is normal.     Breath sounds: Normal breath sounds.  Abdominal:     General: Abdomen is flat. Bowel sounds are normal.     Palpations: Abdomen is soft.     Tenderness: There is no abdominal tenderness.  Musculoskeletal:        General: Normal range of motion.     Cervical back: Normal range of motion.  Skin:    General: Skin is warm.  Neurological:     General: No focal deficit present.     Mental Status: She is alert and oriented to person, place, and time.     Cranial Nerves: No cranial nerve deficit.     Sensory: No sensory deficit.     Motor: No weakness.     Gait: Gait normal.  Psychiatric:        Mood and Affect: Mood normal.        Behavior: Behavior normal.     Comments: She seems confused.  I asked her about her headache and she said that she did not have one that this was only in the hospital.  A few minutes later she was complaining of a left-sided headache.    BP (!) 159/125   Pulse (!) 133   Temp 98 F (36.7 C)   Ht 5' 2"  (1.575 m)   Wt 115 lb 4 oz (52.3 kg)   SpO2 100%   BMI 21.08 kg/m  Wt Readings from Last 3 Encounters:  05/03/21 115  lb (52.2 kg)  05/03/21 115 lb 4 oz (52.3 kg)  04/28/21 113 lb 15.7 oz (51.7 kg)     Health Maintenance Due  Topic Date  Due   Pneumococcal Vaccine 61-68 Years old (1 - PCV) Never done   Hepatitis C Screening  Never done   COVID-19 Vaccine (3 - Pfizer risk series) 07/22/2020   PAP SMEAR-Modifier  11/02/2020   INFLUENZA VACCINE  Never done    There are no preventive care reminders to display for this patient.  Lab Results  Component Value Date   TSH 1.370 11/20/2019   Lab Results  Component Value Date   WBC 9.1 05/03/2021   HGB 13.8 05/03/2021   HCT 40.7 05/03/2021   MCV 100.0 05/03/2021   PLT 281 05/03/2021   Lab Results  Component Value Date   NA 134 (L) 04/30/2021   K 5.0 04/30/2021   CO2 23 04/30/2021   GLUCOSE 98 04/30/2021   BUN 25 (H) 04/30/2021   CREATININE 1.44 (H) 04/30/2021   BILITOT 0.8 04/29/2021   ALKPHOS 31 (L) 04/29/2021   AST 21 04/29/2021   ALT 16 04/29/2021   PROT 6.9 04/29/2021   ALBUMIN 3.4 (L) 04/29/2021   CALCIUM 9.1 04/30/2021   ANIONGAP 6 04/30/2021   EGFR 95 11/01/2020   GFR 89.78 10/31/2020   Lab Results  Component Value Date   CHOL 239 (H) 06/27/2020   Lab Results  Component Value Date   HDL 54 06/27/2020   Lab Results  Component Value Date   LDLCALC 156 (H) 06/27/2020   Lab Results  Component Value Date   TRIG 156 (H) 06/27/2020   Lab Results  Component Value Date   CHOLHDL 4.4 06/27/2020   Lab Results  Component Value Date   HGBA1C 5.2 11/18/2019      Assessment & Plan:   Problem List Items Addressed This Visit       Nervous and Auditory   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 Confusion 3. Hypertensive urgency 4. Acute non intractable tension-type headache Given the patient's  presentation on exam today and her elevated blood pressure and heart rate, I informed her that I do not feel comfortable sending her home this weekend without additional evaluation in the emergency department.  I brought her Godfather back into the exam room with her permission and explained the situation to him as well.  He is agreeable and concerned about her vital signs.  He was given instructions on where to go to the emergency department and said he would take her there right away by personal vehicle.   This note was prepared with assistance of Systems analyst. Occasional wrong-word or sound-a-like substitutions may have occurred due to the inherent limitations of voice recognition software.  Follow-up: No follow-ups on file.    Lelan Cush M Jettie Mannor, PA-C

## 2021-05-03 NOTE — ED Provider Notes (Signed)
[facility] EMERGENCY DEPT Provider Note   CSN: [id number] Arrival date & time: 05/03/21  1220     History Chief Complaint  Patient presents with   Hypertension    Donna Short is a 30 y.o. female.  HPI 30 year old female presents from her PCPs office for abnormal vital signs.  The history is taken from the patient as well as the godfather.  The patient tells me that she is having a moderate left-sided headache that she rates as a 6.  She tells me this started today when she was at the doctor's office.  She does note that she was recently admitted and discharged for seizures but then tells me that she was discharged today.  However she is able to tell me that she was discharged on Sunday and today is Friday.  Difficult history because she seems confused at times and is able to answer questions correctly sometimes but other times gives a bizarre response.  The godfather unfortunately only tells me that she is here for her elevated blood pressure though he does admit that she seems confused today compared to normal.  Past Medical History:  Diagnosis Date   Asthma    Depression    History of chicken pox    Hypertension    Migraines    Pancreatitis 01/2018   Seizures (HCC)     Patient Active Problem List   Diagnosis Date Noted   Encephalopathy 05/03/2021   Hypomagnesemia 04/29/2021   Hyperkalemia 04/29/2021   Nonintractable epilepsy without status epilepticus (HCC) 11/22/2020   Abnormal finding on MRI of brain 11/01/2020   Seizure (HCC) 10/22/2020   PRES (posterior reversible encephalopathy syndrome) 07/18/2020   Acute on chronic respiratory failure with hypoxia (HCC) 06/04/2020   Acute metabolic encephalopathy 06/04/2020   Status post tracheostomy (HCC)    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     Family History  Problem Relation Age of Onset   Diabetes Father    Arthritis Father    Alcohol abuse Father    Breast cancer Mother    Asthma Mother    Drug abuse Mother    Hypertension Mother     Social History   Tobacco Use   Smoking status: Former    Types: Cigarettes    Quit date: 01/21/2018    Years since quitting: 3.2   Smokeless tobacco: Never  Vaping Use   Vaping Use: Never used  Substance Use Topics   Alcohol use: Yes    Alcohol/week: 9.0 standard drinks    Types: 9 Shots of liquor per week    Comment: 02/2019 "  2 shots every 3 or 4 days "   Drug use: No    Home Medications Prior to Admission medications   Medication Sig Start Date End Date Taking? Authorizing Provider  amLODipine (NORVASC) 5 MG tablet TAKE ONE TABLET BY MOUTH DAILY Patient taking differently: Take 5 mg  by mouth daily. 01/22/21  Yes Orland Mustard, MD  folic acid (FOLVITE) 1 MG tablet TAKE ONE TABLET BY MOUTH DAILY Patient taking differently: Take 1 mg by mouth daily. 01/22/21  Yes Orland Mustard, MD  lamoTRIgine (LAMICTAL) 200 MG tablet Take 1 tablet (200 mg total) by mouth 2 (two) times daily. 02/21/21  Yes Levert Feinstein, MD  levETIRAcetam (KEPPRA) 750 MG tablet Take 1 tablet (750 mg total) by mouth 2 (two) times daily. 04/30/21  Yes Rodolph Bong, MD  Multiple Vitamin (MULTIVITAMIN WITH MINERALS) TABS tablet Place 1 tablet into feeding tube daily. Patient taking differently: Take 1 tablet by mouth daily. 06/01/20  Yes Ollis, Brandi L, NP  sertraline (ZOLOFT) 100 MG tablet Take 1 tablet (100 mg total) by mouth daily. 02/21/21  Yes Levert Feinstein, MD  thiamine 100 MG tablet Take 1 tablet (100 mg total) by mouth daily. 05/01/21  Yes Rodolph Bong, MD    Allergies    Claritin [loratadine], Peanut-containing drug products, Benadryl [diphenhydramine hcl (sleep)], Latex, and Shrimp [shellfish allergy]  Review of Systems   Review of Systems  Constitutional:  Negative for fever.  Gastrointestinal:  Negative for abdominal pain, diarrhea and vomiting.  Neurological:  Positive for headaches. Negative for weakness and numbness.  All other systems reviewed and are negative.  Physical Exam Updated Vital Signs BP (!) 122/96 (BP Location: Right Arm)   Pulse 92   Temp 99.6 F (37.6 C) (Oral)   Resp (!) 22   Ht 5\' 2"  (1.575 m)   Wt 52.2 kg   SpO2 100%   BMI 21.03 kg/m   Physical Exam Vitals and nursing note reviewed.  Constitutional:      General: She is not in acute distress.    Appearance: She is well-developed. She is not ill-appearing or diaphoretic.  HENT:     Head: Normocephalic and atraumatic.     Right Ear: External ear normal.     Left Ear: External ear normal.     Nose: Nose normal.  Eyes:     General:        Right eye: No discharge.        Left eye: No discharge.     Extraocular Movements: Extraocular movements intact.     Pupils: Pupils are equal, round, and reactive to light.  Cardiovascular:     Rate and Rhythm: Regular rhythm. Tachycardia present.     Heart sounds: Normal heart sounds.  Pulmonary:     Effort: Pulmonary effort is normal.     Breath sounds: Normal breath sounds.  Abdominal:     Palpations: Abdomen is soft.     Tenderness: There is no abdominal tenderness.  Musculoskeletal:     Cervical back: Normal range of motion and neck supple. No rigidity.  Skin:    General: Skin is warm and dry.  Neurological:     Mental Status: She is alert.     Comments: Patient is awake and alert and while she can tell me that today is Friday and eventually tell me that September 9, she often repeats the  answer Friday when I ask her what month or year it is and finally when I asked her about the dates she will tell me that its September 9.  She also has a difficult time following commands at times, especially with finger-to-nose.  She is eventually able to do it.  She has no obvious slurred speech and her strength is 5/5 in all 4 extremities.  No  obvious abnormal sensation.  Psychiatric:        Mood and Affect: Mood is not anxious.    ED Results / Procedures / Treatments   Labs (all labs ordered are listed, but only abnormal results are displayed) Labs Reviewed  COMPREHENSIVE METABOLIC PANEL - Abnormal; Notable for the following components:      Result Value   Sodium 131 (*)    Chloride 94 (*)    CO2 21 (*)    Glucose, Bld 66 (*)    BUN 25 (*)    Creatinine, Ser 1.07 (*)    Total Protein 9.7 (*)    Albumin 5.2 (*)    Anion gap 16 (*)    All other components within normal limits  ETHANOL - Abnormal; Notable for the following components:   Alcohol, Ethyl (B) 11 (*)    All other components within normal limits  URINALYSIS, ROUTINE W REFLEX MICROSCOPIC - Abnormal; Notable for the following components:   Color, Urine COLORLESS (*)    Protein, ur TRACE (*)    All other components within normal limits  MAGNESIUM - Abnormal; Notable for the following components:   Magnesium 1.6 (*)    All other components within normal limits  SALICYLATE LEVEL - Abnormal; Notable for the following components:   Salicylate Lvl <7.0 (*)    All other components within normal limits  CBG MONITORING, ED - Abnormal; Notable for the following components:   Glucose-Capillary 67 (*)    All other components within normal limits  I-STAT VENOUS BLOOD GAS, ED - Abnormal; Notable for the following components:   pH, Ven 7.636 (*)    pCO2, Ven 20.0 (*)    pO2, Ven <15.0 (*)    Sodium 132 (*)    All other components within normal limits  CBG MONITORING, ED - Abnormal; Notable for the following components:    Glucose-Capillary 58 (*)    All other components within normal limits  CBG MONITORING, ED - Abnormal; Notable for the following components:   Glucose-Capillary 158 (*)    All other components within normal limits  I-STAT VENOUS BLOOD GAS, ED - Abnormal; Notable for the following components:   pH, Ven 7.438 (*)    pCO2, Ven 33.2 (*)    pO2, Ven 90.0 (*)    Sodium 129 (*)    All other components within normal limits  CBG MONITORING, ED - Abnormal; Notable for the following components:   Glucose-Capillary 191 (*)    All other components within normal limits  RESP PANEL BY RT-PCR (FLU A&B, COVID) ARPGX2  CBC WITH DIFFERENTIAL/PLATELET  AMMONIA  PREGNANCY, URINE  RAPID URINE DRUG SCREEN, HOSP PERFORMED  LACTIC ACID, PLASMA  LACTIC ACID, PLASMA    EKG EKG Interpretation  Date/Time:  Friday May 03 2021 17:00:22 EDT Ventricular Rate:  104 PR Interval:  136 QRS Duration: 82 QT Interval:  316 QTC Calculation: 416 R Axis:   57 Text Interpretation: Sinus tachycardia Biatrial enlargement rate is faster compared to Sept 4 2022 Confirmed by Pricilla LovelessGoldston, Jewell Ryans [phone]([id number]) on 05/03/2021 5:17:58 PM  Radiology CT HEAD WO CONTRAST (5MM)  Result Date: 05/03/2021 CLINICAL DATA:  Altered mental status.  Headache. EXAM: CT HEAD WITHOUT CONTRAST TECHNIQUE: Contiguous axial images were obtained from the base of the skull through the vertex without intravenous contrast. COMPARISON:  April 28, 2021. FINDINGS: Brain: No evidence of acute infarction, hemorrhage, hydrocephalus, extra-axial collection or mass lesion/mass effect. Vascular: No hyperdense vessel or unexpected calcification. Skull: Normal. Negative for  fracture or focal lesion. Sinuses/Orbits: No acute finding. Other: None. IMPRESSION: No acute intracranial abnormality seen. Electronically Signed   By: Lupita Raider M.D.   On: 05/03/2021 16:29    Procedures Procedures   Medications Ordered in ED Medications  sodium chloride flush (NS) 0.9  % injection 3 mL (has no administration in time range)  sodium chloride flush (NS) 0.9 % injection 3 mL (has no administration in time range)  0.9 %  sodium chloride infusion (has no administration in time range)  LORazepam (ATIVAN) injection 0-4 mg (has no administration in time range)    Or  LORazepam (ATIVAN) tablet 0-4 mg (has no administration in time range)  LORazepam (ATIVAN) injection 0-4 mg (has no administration in time range)    Or  LORazepam (ATIVAN) tablet 0-4 mg (has no administration in time range)  lactated ringers bolus 1,000 mL (0 mLs Intravenous Stopped 05/03/21 1849)  thiamine (B-1) injection 100 mg (100 mg Intravenous Given 05/03/21 1731)  dextrose 50 % solution 50 mL (50 mLs Intravenous Given 05/03/21 1749)  LORazepam (ATIVAN) tablet 1 mg (1 mg Oral Given 05/03/21 1942)  magnesium oxide (MAG-OX) tablet 400 mg (400 mg Oral Given 05/03/21 1959)  labetalol (NORMODYNE) injection 5 mg (5 mg Intravenous Given 05/03/21 2016)  amLODipine (NORVASC) tablet 5 mg (5 mg Oral Given 05/03/21 2017)    ED Course  I have reviewed the triage vital signs and the nursing notes.  Pertinent labs & imaging results that were available during my care of the patient were reviewed by me and considered in my medical decision making (see chart for details).    MDM Rules/Calculators/A&P                           Patient is mildly altered.  Initially she seems to be able to answer some questions but then has somewhat bizarre behavior.  Unclear if this is psych versus CNS.  She is not febrile suspicion of acute CNS infections pretty low. CT head is negative.  She has had PRES before and while she does have some hypertension it does not seem like it would be high enough to cause this.  Perhaps some alcohol withdrawal and while she is not really tremulous, she is a little hypertensive and tachycardic.  We will give a small dose of Ativan.  Otherwise I think she will need admission, MRI, and likely a neuro consult.   Discussed with Dr. Rachael Darby for admission. Final Clinical Impression(s) / ED Diagnoses Final diagnoses:  Altered mental status, unspecified altered mental status type    Rx / DC Orders ED Discharge Orders     None        Pricilla Loveless, MD 05/03/21 2145

## 2021-05-03 NOTE — ED Triage Notes (Signed)
Pt checked her BP at home SBP= 160's and per pt, she is having a headache.

## 2021-05-03 NOTE — ED Notes (Signed)
Pt's CBG result was 158. Informed Joe - RN and Dr. Criss Alvine.

## 2021-05-03 NOTE — Patient Instructions (Signed)
Your blood pressure is too high & you have a headache, which is concerning.  We need you to go back to the Emergency Department today. Please go to [facility] Emergency Dept with your godfather driving you for urgent evaluation.

## 2021-05-03 NOTE — ED Notes (Signed)
Pt's CBG result was 191. Informed Katelyn - RN.

## 2021-05-03 NOTE — ED Notes (Signed)
Pt alert to self, DOB and day but Pt does not understand who the president is or abut the president when asked. Pt can follow instructions. MD notified. CBG obtained. Crackers and diet ginger ale given to Pt  per Dr. Criss Alvine

## 2021-05-04 ENCOUNTER — Observation Stay (HOSPITAL_COMMUNITY): Payer: Managed Care, Other (non HMO)

## 2021-05-04 ENCOUNTER — Encounter (HOSPITAL_COMMUNITY): Payer: Self-pay | Admitting: Internal Medicine

## 2021-05-04 DIAGNOSIS — E871 Hypo-osmolality and hyponatremia: Secondary | ICD-10-CM | POA: Diagnosis not present

## 2021-05-04 DIAGNOSIS — G9341 Metabolic encephalopathy: Principal | ICD-10-CM

## 2021-05-04 DIAGNOSIS — I6783 Posterior reversible encephalopathy syndrome: Secondary | ICD-10-CM | POA: Diagnosis not present

## 2021-05-04 DIAGNOSIS — R569 Unspecified convulsions: Secondary | ICD-10-CM

## 2021-05-04 DIAGNOSIS — F1010 Alcohol abuse, uncomplicated: Secondary | ICD-10-CM | POA: Diagnosis not present

## 2021-05-04 DIAGNOSIS — E162 Hypoglycemia, unspecified: Secondary | ICD-10-CM | POA: Diagnosis not present

## 2021-05-04 LAB — MAGNESIUM: Magnesium: 1.7 mg/dL (ref 1.7–2.4)

## 2021-05-04 LAB — GLUCOSE, CAPILLARY
Glucose-Capillary: 112 mg/dL — ABNORMAL HIGH (ref 70–99)
Glucose-Capillary: 124 mg/dL — ABNORMAL HIGH (ref 70–99)
Glucose-Capillary: 124 mg/dL — ABNORMAL HIGH (ref 70–99)
Glucose-Capillary: 125 mg/dL — ABNORMAL HIGH (ref 70–99)
Glucose-Capillary: 82 mg/dL (ref 70–99)

## 2021-05-04 LAB — CREATININE, SERUM
Creatinine, Ser: 0.9 mg/dL (ref 0.44–1.00)
GFR, Estimated: >60 mL/min (ref >60)

## 2021-05-04 LAB — BASIC METABOLIC PANEL
Anion gap: 14 (ref 5–15)
BUN: 16 mg/dL (ref 6–20)
CO2: 23 mmol/L (ref 22–32)
Calcium: 9.6 mg/dL (ref 8.9–10.3)
Chloride: 95 mmol/L — ABNORMAL LOW (ref 98–111)
Creatinine, Ser: 0.91 mg/dL (ref 0.44–1.00)
GFR, Estimated: >60 mL/min (ref >60)
Glucose, Bld: 76 mg/dL (ref 70–99)
Potassium: 3.5 mmol/L (ref 3.5–5.1)
Sodium: 132 mmol/L — ABNORMAL LOW (ref 135–145)

## 2021-05-04 LAB — T4, FREE: Free T4: 1.24 ng/dL — ABNORMAL HIGH (ref 0.61–1.12)

## 2021-05-04 LAB — CBC
HCT: 37.1 % (ref 36.0–46.0)
Hemoglobin: 12.7 g/dL (ref 12.0–15.0)
MCH: 33.9 pg (ref 26.0–34.0)
MCHC: 34.2 g/dL (ref 30.0–36.0)
MCV: 98.9 fL (ref 80.0–100.0)
Platelets: 235 10*3/uL (ref 150–400)
RBC: 3.75 MIL/uL — ABNORMAL LOW (ref 3.87–5.11)
RDW: 11.5 % (ref 11.5–15.5)
WBC: 7.5 10*3/uL (ref 4.0–10.5)
nRBC: 0 % (ref 0.0–0.2)

## 2021-05-04 LAB — VITAMIN B12: Vitamin B-12: 260 pg/mL (ref 180–914)

## 2021-05-04 LAB — TSH: TSH: 7.139 u[IU]/mL — ABNORMAL HIGH (ref 0.350–4.500)

## 2021-05-04 LAB — LACTIC ACID, PLASMA: Lactic Acid, Venous: 1.7 mmol/L (ref 0.5–1.9)

## 2021-05-04 MED ORDER — PROCHLORPERAZINE EDISYLATE 10 MG/2ML IJ SOLN
10.0000 mg | Freq: Once | INTRAMUSCULAR | Status: AC
  Administered 2021-05-04: 10 mg via INTRAVENOUS
  Filled 2021-05-04: qty 2

## 2021-05-04 MED ORDER — GADOBUTROL 1 MMOL/ML IV SOLN
5.0000 mL | Freq: Once | INTRAVENOUS | Status: AC | PRN
  Administered 2021-05-04: 5 mL via INTRAVENOUS

## 2021-05-04 MED ORDER — LEVETIRACETAM IN NACL 1000 MG/100ML IV SOLN
1000.0000 mg | INTRAVENOUS | Status: AC
  Administered 2021-05-04: 1000 mg via INTRAVENOUS
  Filled 2021-05-04: qty 100

## 2021-05-04 MED ORDER — GADOBUTROL 1 MMOL/ML IV SOLN: 5.0000 mL | Freq: Once | INTRAVENOUS | Status: DC | PRN

## 2021-05-04 NOTE — Progress Notes (Signed)
Progress Note    [name]  IEP:[id number] DOB: [birth year]  DOA: 05/03/2021 PCP: Bary Leriche, PA-C      Brief Narrative:    Medical records reviewed and are as summarized below:  Donna Short is a 30 y.o. female  with a past medical history of seizures and PRES syndrome who was hospitalized from 04/28/2021 - 04/30/2021 for treatment of seizure and altered mental status.  During that hospitalization, she was started on IV Keppra and Vimpat.  EEG confirmed epileptogenic focus in the left posterior temporal region as well as cortical dysfunction in the left hemisphere due to an underlying structural abnormality.  MRI scan showed a small focus of chronic cortical encephalomalacia and unchanged punctate chronic microhemorrhages within the right frontal and parietal lobes.  She was ultimately discharged home on a higher dose of Keppra and her home dose of lamotrigine after she remains seizure-free.  She was referred from her PCPs office because of complaints of headache and confusion.      Assessment/Plan:   Principal Problem:   Acute metabolic encephalopathy Active Problems:   Hyponatremia   Benign essential HTN   ETOH abuse   Depression, recurrent (HCC)   PRES (posterior reversible encephalopathy syndrome)   Nonintractable epilepsy without status epilepticus (HCC)   Acute encephalopathy   Hypoglycemia without diagnosis of diabetes mellitus   Body mass index is 21.03 kg/m.   Acute metabolic encephalopathy in the setting of known seizure disorder and history of PRES syndrome continue antiepileptics.  Case discussed with Dr. Otelia Limes, neurologist.  He recommended the patient be monitored overnight.  Alcohol use disorder, suspected alcohol withdrawal syndrome, sinus tachycardia: Ativan as needed per CIWA protocol.  Hyponatremia: Repeat BMP tomorrow.  Hypomagnesemia: Improved  Depression: Continue Zoloft  Hypoglycemic episodes: This may be due to alcohol use  disorder   Mildly elevated TSH: Free T4 is slightly elevated at 1.24.  Free T3 is pending.  Repeat thyroid function test as an outpatient.    Diet Order             Diet regular Room service appropriate? Yes; Fluid consistency: Thin  Diet effective now                      Consultants: Neurologist  Procedures: None    Medications:    amLODipine  5 mg Oral Daily   enoxaparin (LOVENOX) injection  40 mg Subcutaneous Daily   folic acid  1 mg Oral Daily   lamoTRIgine  200 mg Oral BID   levETIRAcetam  750 mg Oral BID   [START ON 05/06/2021] LORazepam  0-4 mg Intravenous Q12H   Or   [START ON 05/06/2021] LORazepam  0-4 mg Oral Q12H   LORazepam  0-4 mg Intravenous Q6H   Or   LORazepam  0-4 mg Oral Q6H   multivitamin with minerals  1 tablet Per Tube Daily   sertraline  100 mg Oral Daily   sodium chloride flush  3 mL Intravenous Q12H   sodium chloride flush  3 mL Intravenous Q12H   thiamine  100 mg Oral Daily   Continuous Infusions:  sodium chloride       Anti-infectives (From admission, onward)    None              Family Communication/Anticipated D/C date and plan/Code Status   DVT prophylaxis: enoxaparin (LOVENOX) injection 40 mg Start: 05/04/21 1000     Code Status: Full Code  Family  Communication: None Disposition Plan:    Status is: Observation  The patient will require care spanning > 2 midnights and should be moved to inpatient because: Inpatient level of care appropriate due to severity of illness  Dispo: The patient is from: Home              Anticipated d/c is to: Home              Patient currently is not medically stable to d/c.   Difficult to place patient No           Subjective:   Interval events noted.  C/o anxiety.  Her nurse said she had noticed some aphasia this morning.  Objective:    Vitals:   05/04/21 0500 05/04/21 0823 05/04/21 1215 05/04/21 1636  BP:  109/81 109/77   Pulse: (!) 105 95 (!) 103    Resp:  16 20   Temp:  99.1 F (37.3 C) 98.9 F (37.2 C) 98.7 F (37.1 C)  TempSrc:  Oral Oral Oral  SpO2:  100% 98%   Weight:      Height:       No data found.   Intake/Output Summary (Last 24 hours) at 05/04/2021 1652 Last data filed at 05/04/2021 0313 Gross per 24 hour  Intake 1293.64 ml  Output --  Net 1293.64 ml   Filed Weights   05/03/21 1253  Weight: 52.2 kg    Exam:  GEN: NAD SKIN: No rash EYES: EOMI ENT: MMM CV: RRR, tachycardic PULM: CTA B ABD: soft, ND, NT, +BS CNS: AAO x 3, non focal EXT: No edema or tenderness        Data Reviewed:   I have personally reviewed following labs and imaging studies:  Labs: Labs show the following:   Basic Metabolic Panel: Recent Labs  Lab 04/29/21 0138 04/29/21 0908 04/30/21 0051 05/03/21 1344 05/03/21 1714 05/03/21 1756 05/03/21 1858 05/04/21 0012  NA 134* 135 134* 131*  --  132* 129* 132*  K 3.6 3.5 5.0 4.6  --  4.5 3.9 3.5  CL 101 105 105 94*  --   --   --  95*  CO2 22 21* 23 21*  --   --   --  23  GLUCOSE 156* 96 98 66*  --   --   --  76  BUN 24* 19 25* 25*  --   --   --  16  CREATININE 1.01* 0.86 1.44* 1.07*  --   --   --  0.91  0.90  CALCIUM 9.3 9.1 9.1 10.3  --   --   --  9.6  MG  --  1.5* 2.3  --  1.6*  --   --  1.7  PHOS  --  3.9  --   --   --   --   --   --    GFR Estimated Creatinine Clearance: 71 mL/min (by C-G formula based on SCr of 0.9 mg/dL). Liver Function Tests: Recent Labs  Lab 04/29/21 0138 04/29/21 0908 05/03/21 1344  AST ALT ALKPHOS 33* 31* 45  BILITOT 0.4 0.8 0.6  PROT 6.4* 6.9 9.7*  ALBUMIN 3.2* 3.4* 5.2*   No results for input(s): LIPASE, AMYLASE in the last 168 hours. Recent Labs  Lab 05/03/21 1714  AMMONIA 25   Coagulation profile No results for input(s): INR, PROTIME in the last 168 hours.  CBC: Recent Labs  Lab 04/28/21 1434  04/29/21 0138 04/29/21 0908 04/30/21 0051 05/03/21 1344 05/03/21 1756 05/03/21 1858 05/04/21 0012   WBC 7.0 9.7 6.2 7.5 9.1  --   --  7.5  NEUTROABS 4.2 6.7  --   --  6.5  --   --   --   HGB 13.0 10.9* 11.7* 11.7* 13.8 13.3 12.6 12.7  HCT 39.1 31.6* 35.2* 35.0* 40.7 39.0 37.0 37.1  MCV 104.5* 99.7 101.7* 102.0* 100.0  --   --  98.9  PLT 172 157 154 171 281  --   --  235   Cardiac Enzymes: No results for input(s): CKTOTAL, CKMB, CKMBINDEX, TROPONINI in the last 168 hours. BNP (last 3 results) No results for input(s): PROBNP in the last 8760 hours. CBG: Recent Labs  Lab 05/03/21 1955 05/04/21 0022 05/04/21 0659 05/04/21 1214 05/04/21 1627  GLUCAP 191* 82 124* 124* 125*   D-Dimer: No results for input(s): DDIMER in the last 72 hours. Hgb A1c: No results for input(s): HGBA1C in the last 72 hours. Lipid Profile: No results for input(s): CHOL, HDL, LDLCALC, TRIG, CHOLHDL, LDLDIRECT in the last 72 hours. Thyroid function studies: Recent Labs    05/04/21 0358  TSH 7.139*   Anemia work up: Recent Labs    05/04/21 0358  VITAMINB12 260   Sepsis Labs: Recent Labs  Lab 04/29/21 0908 04/30/21 0051 05/03/21 1344 05/03/21 1714 05/04/21 0012  WBC 6.2 7.5 9.1  --  7.5  LATICACIDVEN  --   --   --  1.0 1.7    Microbiology Recent Results (from the past 240 hour(s))  SARS CORONAVIRUS 2 (TAT 6-24 HRS) Nasopharyngeal Nasopharyngeal Swab     Status: None   Collection Time: 04/28/21  9:18 PM   Specimen: Nasopharyngeal Swab  Result Value Ref Range Status   SARS Coronavirus 2 NEGATIVE NEGATIVE Final    Comment: (NOTE) SARS-CoV-2 target nucleic acids are NOT DETECTED.  The SARS-CoV-2 RNA is generally detectable in upper and lower respiratory specimens during the acute phase of infection. Negative results do not preclude SARS-CoV-2 infection, do not rule out co-infections with other pathogens, and should not be used as the sole basis for treatment or other patient management decisions. Negative results must be combined with clinical observations, patient history, and  epidemiological information. The expected result is Negative.  Fact Sheet for Patients: HairSlick.no  Fact Sheet for Healthcare Providers: quierodirigir.com  This test is not yet approved or cleared by the [place] FDA and  has been authorized for detection and/or diagnosis of SARS-CoV-2 by FDA under an Emergency Use Authorization (EUA). This EUA will remain  in effect (meaning this test can be used) for the duration of the COVID-19 declaration under Se ction 564(b)(1) of the Act, 21 U.S.C. section 360bbb-3(b)(1), unless the authorization is terminated or revoked sooner.  Performed at [facility], [address], [place], [place] [address]   Resp Panel by RT-PCR (Flu A&B, Covid) Nasopharyngeal Swab     Status: None   Collection Time: 05/03/21  5:14 PM   Specimen: Nasopharyngeal Swab; Nasopharyngeal(NP) swabs in vial transport medium  Result Value Ref Range Status   SARS Coronavirus 2 by RT PCR NEGATIVE NEGATIVE Final    Comment: (NOTE) SARS-CoV-2 target nucleic acids are NOT DETECTED.  The SARS-CoV-2 RNA is generally detectable in upper respiratory specimens during the acute phase of infection. The lowest concentration of SARS-CoV-2 viral copies this assay can detect is 138 copies/mL. A negative result does not preclude SARS-Cov-2 infection and should  not be used as the sole basis for treatment or other patient management decisions. A negative result may occur with  improper specimen collection/handling, submission of specimen other than nasopharyngeal swab, presence of viral mutation(s) within the areas targeted by this assay, and inadequate number of viral copies(<138 copies/mL). A negative result must be combined with clinical observations, patient history, and epidemiological information. The expected result is Negative.  Fact Sheet for Patients:  BloggerCourse.comhttps://www.fda.gov/media/152166/download  Fact Sheet for  Healthcare Providers:  SeriousBroker.ithttps://www.fda.gov/media/152162/download  This test is no t yet approved or cleared by the [place] FDA and  has been authorized for detection and/or diagnosis of SARS-CoV-2 by FDA under an Emergency Use Authorization (EUA). This EUA will remain  in effect (meaning this test can be used) for the duration of the COVID-19 declaration under Section 564(b)(1) of the Act, 21 U.S.C.section 360bbb-3(b)(1), unless the authorization is terminated  or revoked sooner.       Influenza A by PCR NEGATIVE NEGATIVE Final   Influenza B by PCR NEGATIVE NEGATIVE Final    Comment: (NOTE) The Xpert Xpress SARS-CoV-2/FLU/RSV plus assay is intended as an aid in the diagnosis of influenza from Nasopharyngeal swab specimens and should not be used as a sole basis for treatment. Nasal washings and aspirates are unacceptable for Xpert Xpress SARS-CoV-2/FLU/RSV testing.  Fact Sheet for Patients: BloggerCourse.comhttps://www.fda.gov/media/152166/download  Fact Sheet for Healthcare Providers: SeriousBroker.ithttps://www.fda.gov/media/152162/download  This test is not yet approved or cleared by the [place] FDA and has been authorized for detection and/or diagnosis of SARS-CoV-2 by FDA under an Emergency Use Authorization (EUA). This EUA will remain in effect (meaning this test can be used) for the duration of the COVID-19 declaration under Section 564(b)(1) of the Act, 21 U.S.C. section 360bbb-3(b)(1), unless the authorization is terminated or revoked.  Performed at [facility], [address], [place], [place] [address]     Procedures and diagnostic studies:  CT HEAD WO CONTRAST (5MM)  Result Date: 05/03/2021 CLINICAL DATA:  Altered mental status.  Headache. EXAM: CT HEAD WITHOUT CONTRAST TECHNIQUE: Contiguous axial images were obtained from the base of the skull through the vertex without intravenous contrast. COMPARISON:  April 28, 2021. FINDINGS: Brain: No evidence of acute  infarction, hemorrhage, hydrocephalus, extra-axial collection or mass lesion/mass effect. Vascular: No hyperdense vessel or unexpected calcification. Skull: Normal. Negative for fracture or focal lesion. Sinuses/Orbits: No acute finding. Other: None. IMPRESSION: No acute intracranial abnormality seen. Electronically Signed   By: Lupita RaiderJames  Green Jr M.D.   On: 05/03/2021 16:29   MR BRAIN W WO CONTRAST  Result Date: 05/04/2021 CLINICAL DATA:  Initial evaluation for seizure, abnormal neuro exam. EXAM: MRI HEAD WITHOUT AND WITH CONTRAST TECHNIQUE: Multiplanar, multiecho pulse sequences of the brain and surrounding structures were obtained without and with intravenous contrast. CONTRAST:  5mL GADAVIST GADOBUTROL 1 MMOL/ML IV SOLN COMPARISON:  Head CT from 05/03/2021 and recent brain MRI from 04/29/2021. FINDINGS: Brain: Examination moderately degraded by motion artifact. Stable cerebral volume, which remains within normal limits. Probable subtle residual FLAIR signal abnormality within the left parietal lobe related to prior PRES again noted, grossly stable. No signal changes to suggest acute PRES at this time. No abnormal foci of restricted diffusion to suggest acute or subacute ischemia or changes related to seizure. Gray-white matter differentiation maintained. No acute intracranial hemorrhage. Few scattered chronic micro hemorrhages again noted within the posterior right frontoparietal region No mass lesion, midline shift or mass effect. No hydrocephalus or extra-axial fluid collection. Pituitary gland suprasellar region within  normal limits. No visible intrinsic temporal lobe abnormality on this motion degraded exam. No appreciable abnormal enhancement. Vascular: Major intracranial vascular flow voids are maintained. Skull and upper cervical spine: Craniocervical junction within normal limits. Bone marrow signal intensity within normal limits. No scalp soft tissue abnormality. Sinuses/Orbits: Globes and orbital soft  tissues demonstrate no acute finding. Paranasal sinuses are largely clear. No significant mastoid effusion. Inner ear structures grossly normal. Other: None. IMPRESSION: 1. Motion degraded exam. 2. Grossly stable brain MRI from 04/29/2021. No evidence for acute PRES or other abnormality. Electronically Signed   By: Rise Mu M.D.   On: 05/04/2021 04:29   EEG adult  Result Date: 05/04/2021 [name], MD     05/04/2021 11:29 AM Patient Name: Donna Short MRN: [id number] Epilepsy Attending: [name] Referring Physician/Provider: Dr Milon Dikes Date: 05/04/2021 Duration: 22.20 mins Patient history: 30 year old with above past medical history that includes uncontrolled hypertension, seizures, questionable compliance to medications, alcohol abuse, recent admission for breakthrough seizures-question medication compliance versus alcohol withdrawal, presenting for evaluation of left-sided headache and altered mental status that he has been ongoing for a week. EEG to evaluate for seizure Level of alertness: Awake, asleep AEDs during EEG study: LTG,LEV Technical aspects: This EEG study was done with scalp electrodes positioned according to the 10-20 International system of electrode placement. Electrical activity was acquired at a sampling rate of 500Hz  and reviewed with a high frequency filter of 70Hz  and a low frequency filter of 1Hz . EEG data were recorded continuously and digitally stored. Description: The posterior dominant rhythm consists of 9-10 Hz activity of moderate voltage (25-35 uV) seen predominantly in posterior head regions, symmetric and reactive to eye opening and eye closing. Sleep was characterized by vertex waves, sleep spindles (12 to 14 Hz), maximal frontocentral region.   EEG showed abundant sharp waves and intermittent 3-5hz  theta-delta slowing in left posterior temporal region, maximal P7. Physiologic photic driving was seen during photic stimulation.  Hyperventilation  was  not performed.   ABNORMALITY -Sharp wave, left posterior temporal region - Intermittent slow, left posterior temporal region IMPRESSION: This study is consistent with patient's known history of epileptogenicity and cortical dysfunction arising from left posterior temporal region. No seizures were seen throughout the recording. Priyanka               LOS: 1 day   Madison Albea  [facility]   Pager on [email or web address] . If 7PM-7AM, please contact night-coverage at [email or web address]     05/04/2021, 4:53 PM

## 2021-05-04 NOTE — Plan of Care (Signed)
MRI brain: Grossly stable brain MRI from 04/29/2021. No evidence for acute PRES or other abnormality.  EEG is pending.  Electronically signed: Dr. Caryl Pina

## 2021-05-04 NOTE — Consult Note (Addendum)
Neurology Consultation  Reason for Consult: Altered mental status, headache Referring Physician: Dr. Trula Ore Rama  CC: Altered mental status, headache  History is obtained from:, Chart  HPI: Donna Short is a 30 y.o. 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  She was recently discharged from the hospital, seen last on 04/29/2021 by my colleague, epileptologist, Dr. Melynda Ripple, for breakthrough seizures with concern for possible medication noncompliance versus alcohol withdrawal.  She was started back on her home dose of Lamictal 200 twice daily and Keppra was increased to 750 twice daily. 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  She required multiple medications at that time-Keppra, Dilantin, phenobarbital and was intubated with heavy sedation prior to extubation and discharge. Her last EEG on 04/29/2021 showed left posterior temporal spikes and continuous slowing which was generalized and also lateralized to the left. She comes in today via the ER where she was sent by her primary care physician where she had gone for follow-up. She has been reporting a headache that is been going on for about a week.  Reports the headache to be in the left frontotemporal region, consistently present, no aggravating or relieving factors other than some photophobia.  The primary care provider also found her to be somewhat confused along with her blood pressures being on the higher side and sent her to the emergency room. She was admitted to the hospital service for further evaluation. The admitting hospitalist also found her to be confused and given her prior history of  nonconvulsive status epilepticus, posterior reversible encephalopathy syndrome-neurological consultation was obtained. The patient was able to have a conversation with me but at times perseverated and did have extremely reduced attention concentration. No active seizure activity noted at the time of this encounter.  Unclear if she has been compliant to medications.  Unclear if she has been abusing alcohol-most recent alcohol level drawn at 5:14 PM today was 11-one-point above the detectable limit.  ROS: Full ROS was performed and is negative except as noted in the HPI.   Past Medical History:  Diagnosis Date   Asthma    Depression    History of chicken pox    Hypertension    Migraines    Pancreatitis 01/2018   Seizures (HCC)      Family History  Problem Relation Age of Onset   Diabetes Father    Arthritis Father    Alcohol abuse Father    Breast cancer Mother    Asthma Mother    Drug abuse Mother    Hypertension Mother      Social History:   reports that she quit smoking about 3 years ago. Her smoking use included cigarettes. She has never used smokeless tobacco. She reports current alcohol use of about 9.0 standard drinks per week. She reports that she does not use drugs.  Medications  Current Facility-Administered Medications:    0.9 %  sodium chloride infusion, 250 mL, Intravenous, PRN, Rama, Christina P, MD   amLODipine (NORVASC) tablet 5 mg, 5 mg, Oral, Daily, Rama, Christina P, MD   enoxaparin (LOVENOX) injection 40 mg, 40 mg, Subcutaneous, Daily, Rama, Maryruth Bun, MD   folic acid (FOLVITE) tablet 1 mg, 1 mg, Oral, Daily, Rama, Maryruth Bun, MD  lamoTRIgine (LAMICTAL) tablet 200 mg, 200 mg, Oral, BID, Rama, Maryruth Bun, MD   levETIRAcetam (KEPPRA) tablet 750 mg, 750 mg, Oral, BID, Rama, Maryruth Bun, MD   [START ON 05/06/2021] LORazepam (ATIVAN) injection 0-4 mg, 0-4 mg, Intravenous, Q12H **OR** [START ON 05/06/2021] LORazepam (ATIVAN) tablet 0-4 mg, 0-4 mg, Oral, Q12H,  Pricilla Loveless, MD   LORazepam (ATIVAN) injection 0-4 mg, 0-4 mg, Intravenous, Q6H **OR** LORazepam (ATIVAN) tablet 0-4 mg, 0-4 mg, Oral, Q6H, Chotiner, Claudean Severance, MD   multivitamin with minerals tablet 1 tablet, 1 tablet, Per Tube, Daily, Rama, Maryruth Bun, MD   sertraline (ZOLOFT) tablet 100 mg, 100 mg, Oral, Daily, Rama, Maryruth Bun, MD   sodium chloride flush (NS) 0.9 % injection 3 mL, 3 mL, Intravenous, Q12H, Criss Alvine, Scott, MD   sodium chloride flush (NS) 0.9 % injection 3 mL, 3 mL, Intravenous, PRN, Pricilla Loveless, MD   sodium chloride flush (NS) 0.9 % injection 3 mL, 3 mL, Intravenous, Q12H, Rama, Maryruth Bun, MD   thiamine tablet 100 mg, 100 mg, Oral, Daily, Rama, Maryruth Bun, MD   Exam: Current vital signs: BP (!) 122/96 (BP Location: Right Arm)   Pulse 92   Temp 99.6 F (37.6 C) (Oral)   Resp (!) 22   Ht 5\' 2"  (1.575 m)   Wt 52.2 kg   SpO2 100%   BMI 21.03 kg/m  Vital signs in last 24 hours: Temp:  [98 F (36.7 C)-99.6 F (37.6 C)] 99.6 F (37.6 C) (09/09 2126) Pulse Rate:  [92-133] 92 (09/09 2126) Resp:  [16-28] 22 (09/09 2126) BP: (122-170)/(95-134) 122/96 (09/09 2126) SpO2:  [99 %-100 %] 100 % (09/09 2126) Weight:  [52.2 kg-52.3 kg] 52.2 kg (09/09 1253) General: Awake alert in no distress HEENT: Normocephalic, atraumatic, dry oral mucous membranes Lungs: Clear to auscultation Cardiovascular: S1-S2 heard, regular rate rhythm Abdomen: Soft nondistended nontender Extremities: Warm well perfused with intact pulses Neurological exam She is awake, alert, oriented to the fact that she is in the hospital, current month and the year but required redirection-I asked her what month it is and she told me the year multiple times before correcting herself to tell me the correct month of September.  She was able to tell me the correct date September 9. She was able to name simple objects. Upon trying to test her attention concentration, when I asked her questions to add  simple currency coins, she gave me the answer "May 03, 2021 [facility]" Repetition was intact. Fluency was preserved Cranial nerves: Pupils equal round react light, extraocular movements intact, visual fields full, facial sensation intact, face symmetric, auditory acuity intact, tongue and palate midline. Motor exam with no drift in any of the 4 extremities Sensation intact light touch all over without extinction Coordination with no dysmetria Gait testing deferred at this time.  Labs I have reviewed labs in epic and the results pertinent to this consultation are:  CBC    Component Value Date/Time   WBC 9.1 05/03/2021 1344   RBC 4.07 05/03/2021 1344   HGB 12.6 05/03/2021 1858   HGB 11.9 11/01/2020 1459   HCT 37.0 05/03/2021 1858   HCT 34.3 11/01/2020 1459   PLT 281 05/03/2021 1344   MCV 100.0 05/03/2021 1344   MCV 98 (H) 11/01/2020 1459   MCH 33.9 05/03/2021 1344   MCHC 33.9 05/03/2021 1344   RDW 11.8 05/03/2021 1344   RDW 12.8 11/01/2020 1459   LYMPHSABS 1.8 05/03/2021 1344   LYMPHSABS 2.2 11/01/2020 1459  MONOABS 0.7 05/03/2021 1344   EOSABS 0.0 05/03/2021 1344   EOSABS 0.1 11/01/2020 1459   BASOSABS 0.0 05/03/2021 1344   BASOSABS 0.0 11/01/2020 1459    CMP     Component Value Date/Time   NA 129 (L) 05/03/2021 1858   NA 141 11/01/2020 1459   K 3.9 05/03/2021 1858   CL 94 (L) 05/03/2021 1344   CO2 21 (L) 05/03/2021 1344   GLUCOSE 66 (L) 05/03/2021 1344   BUN 25 (H) 05/03/2021 1344   BUN 14 11/01/2020 1459   CREATININE 1.07 (H) 05/03/2021 1344   CREATININE 0.79 06/27/2020 1510   CALCIUM 10.3 05/03/2021 1344   PROT 9.7 (H) 05/03/2021 1344   PROT 7.8 11/01/2020 1459   ALBUMIN 5.2 (H) 05/03/2021 1344   ALBUMIN 4.4 11/01/2020 1459   AST 31 05/03/2021 1344   ALT 19 05/03/2021 1344   ALKPHOS 45 05/03/2021 1344   BILITOT 0.6 05/03/2021 1344   BILITOT <0.2 11/01/2020 1459   GFRNONAA >60 05/03/2021 1344   GFRNONAA 100 06/27/2020 1510   GFRAA 116  06/27/2020 1510  Ammonia 25    Imaging I have reviewed the images obtained:  CT-head-no acute intracranial abnormality seen   Assessment:  30 year old with above past medical history that includes uncontrolled hypertension, seizures, questionable compliance to medications, alcohol abuse, recent admission for breakthrough seizures-question medication compliance versus alcohol withdrawal, presenting for evaluation of left-sided headache and altered mental status that he has been ongoing for a week. The only abnormal things on her exams were impaired attention concentration as well as perseveration. Her prior EEGs had left-sided slowing and left-sided temporal spikes. Given her prior history, nonconvulsive seizures should be kept in the differentials. She would benefit from repeat EEG as well as imaging to rule out posterior reversible encephalopathy syndrome as her pressures have been on the higher side with the highest pressure recorded systolic 170 today.    Recommendations: Continue home antiepileptics-Keppra 750 twice daily and Lamictal 200 twice daily. Give additional dose of Keppra 1000 mg IV x1 EEG in the morning MRI brain with and without contrast Labs with unremarkable CBC, and some hyponatremia-which could have provoked seizure activity Medical management per primary team as you are Check B12, TSH, thiamine levels Already received Keppra and Lamictal-do not think checking the level would be of much use. CIWA protocol From tomorrow, start replenishing high-dose thiamine per Wernicke's protocol. Compazine 10 mg injection x1-for headache  Preliminary plan discussed with Dr. Darnelle Catalan over the phone and final plan relayed via secure chat.  Neurology will follow.  -- Milon Dikes, MD Neurologist [facility] Pager: [phone]

## 2021-05-04 NOTE — Progress Notes (Signed)
EEG Complete  Results Pending 

## 2021-05-04 NOTE — Progress Notes (Signed)
In speaking with patient noticed patient not responding appropriately to questions asked, used stroke scale pictures for pt assessment, pt has noted evidence of aphasia.

## 2021-05-04 NOTE — Procedures (Signed)
Patient Name: Donna Short  MRN: [id number]  Epilepsy Attending: [name]  Referring Physician/Provider: Dr Milon Dikes  Date: 05/04/2021 Duration: 22.20 mins  Patient history: 30 year old with above past medical history that includes uncontrolled hypertension, seizures, questionable compliance to medications, alcohol abuse, recent admission for breakthrough seizures-question medication compliance versus alcohol withdrawal, presenting for evaluation of left-sided headache and altered mental status that he has been ongoing for a week. EEG to evaluate for seizure  Level of alertness: Awake, asleep  AEDs during EEG study: LTG,LEV  Technical aspects: This EEG study was done with scalp electrodes positioned according to the 10-20 International system of electrode placement. Electrical activity was acquired at a sampling rate of 500Hz  and reviewed with a high frequency filter of 70Hz  and a low frequency filter of 1Hz . EEG data were recorded continuously and digitally stored.   Description: The posterior dominant rhythm consists of 9-10 Hz activity of moderate voltage (25-35 uV) seen predominantly in posterior head regions, symmetric and reactive to eye opening and eye closing. Sleep was characterized by vertex waves, sleep spindles (12 to 14 Hz), maximal frontocentral region.   EEG showed abundant sharp waves and intermittent 3-5hz  theta-delta slowing in left posterior temporal region, maximal P7. Physiologic photic driving was seen during photic stimulation.  Hyperventilation was  not performed.     ABNORMALITY -Sharp wave, left posterior temporal region - Intermittent slow, left posterior temporal region  IMPRESSION: This study is consistent with patient's known history of epileptogenicity and cortical dysfunction arising from left posterior temporal region. No seizures were seen throughout the recording.  Rafal Archuleta 

## 2021-05-05 DIAGNOSIS — I6783 Posterior reversible encephalopathy syndrome: Secondary | ICD-10-CM | POA: Diagnosis not present

## 2021-05-05 LAB — GLUCOSE, CAPILLARY
Glucose-Capillary: 107 mg/dL — ABNORMAL HIGH (ref 70–99)
Glucose-Capillary: 101 mg/dL — ABNORMAL HIGH (ref 70–99)

## 2021-05-05 MED ORDER — LEVETIRACETAM 500 MG PO TABS
1000.0000 mg | ORAL_TABLET | Freq: Two times a day (BID) | ORAL | Status: DC
  Administered 2021-05-05: 1000 mg via ORAL
  Filled 2021-05-05: qty 2

## 2021-05-05 MED ORDER — CYANOCOBALAMIN 1000 MCG/ML IJ SOLN
1000.0000 ug | Freq: Every day | INTRAMUSCULAR | Status: DC
  Administered 2021-05-05: 1000 ug via SUBCUTANEOUS
  Filled 2021-05-05: qty 1

## 2021-05-05 MED ORDER — LEVETIRACETAM 1000 MG PO TABS: 1000.0000 mg | ORAL_TABLET | Freq: Two times a day (BID) | ORAL | 0 refills | Status: DC

## 2021-05-05 MED ORDER — METOPROLOL TARTRATE 25 MG PO TABS
25.0000 mg | ORAL_TABLET | Freq: Two times a day (BID) | ORAL | Status: DC
  Administered 2021-05-05: 25 mg via ORAL
  Filled 2021-05-05: qty 1

## 2021-05-05 MED ORDER — VITAMIN B-12 1000 MCG PO TABS: 1000.0000 ug | ORAL_TABLET | Freq: Every day | ORAL | Status: DC

## 2021-05-05 MED ORDER — CYANOCOBALAMIN 1000 MCG PO TABS: 1000.0000 ug | ORAL_TABLET | Freq: Every day | ORAL | 0 refills | Status: DC

## 2021-05-05 NOTE — Plan of Care (Signed)
                                      [facility]                            [address] [place], [place] [address]      Donna Short was admitted to the Hospital on 05/03/2021 and Discharged  05/05/2021 and should be excused from work/school   for 4 days starting from date -  05/03/2021 , may return to work/school without any restrictions.  Call Susa Raring MD, [facility]  [phone] with questions.  Susa Raring M.D on 05/05/2021,at 10:09 AM  [facility]   Office  [phone]

## 2021-05-05 NOTE — Discharge Summary (Signed)
Donna Short IRS:[id number] DOB: 11-29-1990 DOA: 05/03/2021  PCP: Bary Leriche, PA-C  Admit date: 05/03/2021  Discharge date: 05/05/2021  Admitted From: Home  Disposition:  Home   Recommendations for Outpatient Follow-up:   Follow up with PCP in 1-2 weeks  PCP Please obtain BMP/CBC, 2 view CXR in 1week,  (see Discharge instructions)   PCP Please follow up on the following pending results: Monitor CBC, CMP, TSH, B12, thiamine levels closely.   Home Health: None   Equipment/Devices: None  Consultations: Neuro Discharge Condition: Stable    CODE STATUS: Full    Diet Recommendation: Heart Healthy     Chief Complaint  Patient presents with   Hypertension     Brief history of present illness from the day of admission and additional interim summary    Donna Short is a 30 y.o. female  with a past medical history of seizures and PRES syndrome who was hospitalized from 04/28/2021 - 04/30/2021 for treatment of seizure and altered mental status.  During that hospitalization, she was started on IV Keppra and Vimpat.  EEG confirmed epileptogenic focus in the left posterior temporal region as well as cortical dysfunction in the left hemisphere due to an underlying structural abnormality.  MRI scan showed a small focus of chronic cortical encephalomalacia and unchanged punctate chronic microhemorrhages within the right frontal and parietal lobes.  She was ultimately discharged home on a higher dose of Keppra and her home dose of lamotrigine after she remains seizure-free.  She was referred from her PCPs office because of complaints of headache and confusion.                                                                 Hospital Course    Acute metabolic encephalopathy in the setting of known seizure disorder and  history of PRES syndrome -  she had a non acute EEG and MRI, her improvement came about after she received an extra dose of IV Keppra in the ER, she is now back to her baseline no signs of DTs, blood pressure stable, case discussed with neurology on 05/06/2019 with Dr. Otelia Limes, plan is to increase at home dose Keppra tablet twice daily, continue other antiseizure medication and as before.  Her B12 was borderline low hence she has been placed on oral B12 supplement.  Request PCP to monitor her CBC, CMP, TSH and thiamine levels closely.  Note her TSH level was slightly elevated and needs to be closely monitored.  Alcohol use disorder.  No signs of DTs, counseled to quit.  Hyponatremia: Stable after IV fluids.  Hypomagnesemia: Replaced and stable.  Depression: Continue Zoloft   Hypoglycemic episodes: This may be due to alcohol use disorder    Mildly elevated TSH: Free T4 is slightly elevated at 1.24.  Free T3 is pending.  Repeat thyroid function test as an outpatient.   Discharge diagnosis     Principal Problem:   Acute metabolic encephalopathy Active Problems:   Hyponatremia   Benign essential HTN   ETOH abuse   Depression, recurrent (HCC)   PRES (posterior reversible encephalopathy syndrome)   Nonintractable epilepsy without status epilepticus (HCC)   Acute encephalopathy   Hypoglycemia without diagnosis of diabetes mellitus    Discharge instructions    Discharge Instructions     Diet - low sodium heart healthy   Complete by: As directed    Discharge instructions   Complete by: As directed    Do not drive, operate heavy machinery, perform activities at heights, swimming or participation in water activities or provide baby sitting services until you have seen by Primary MD or a Neurologist and advised to do so again.  Follow with Primary MD Allwardt, Crist Infante, PA-C and your Neurologist in 7 days   Get CBC, CMP, B12, B1, TSH, checked next visit within 1 week by Primary MD     Activity: As tolerated with Full fall precautions use walker/cane & assistance as needed  Disposition Home    Diet: Heart Healthy    Special Instructions: If you have smoked or chewed Tobacco  in the last 2 yrs please stop smoking, stop any regular Alcohol  and or any Recreational drug use.  On your next visit with your primary care physician please Get Medicines reviewed and adjusted.  Please request your Prim.MD to go over all Hospital Tests and Procedure/Radiological results at the follow up, please get all Hospital records sent to your Prim MD by signing hospital release before you go home.  If you experience worsening of your admission symptoms, develop shortness of breath, life threatening emergency, suicidal or homicidal thoughts you must seek medical attention immediately by calling 911 or calling your MD immediately  if symptoms less severe.  You Must read complete instructions/literature along with all the possible adverse reactions/side effects for all the Medicines you take and that have been prescribed to you. Take any new Medicines after you have completely understood and accpet all the possible adverse reactions/side effects.   Increase activity slowly   Complete by: As directed        Discharge Medications   Allergies as of 05/05/2021       Reactions   Claritin [loratadine] Itching   Peanut-containing Drug Products Other (See Comments)   Family not sure of reaction   Benadryl [diphenhydramine Hcl (sleep)] Rash   Latex Rash   Shrimp [shellfish Allergy] Hives, Rash        Medication List     TAKE these medications    acetaminophen 500 MG tablet Commonly known as: TYLENOL Take 1,000 mg by mouth every 6 (six) hours as needed for moderate pain.   Albuterol Sulfate 108 (90 Base) MCG/ACT Aepb Commonly known as: PROAIR RESPICLICK Inhale 2 puffs into the lungs daily as needed (wheezing).   amLODipine 5 MG tablet Commonly known as: NORVASC TAKE ONE TABLET  BY MOUTH DAILY   cyanocobalamin 1000 MCG tablet Take 1 tablet (1,000 mcg total) by mouth daily. Start taking on: May 12, 2021   folic acid 1 MG tablet Commonly known as: FOLVITE TAKE ONE TABLET BY MOUTH DAILY   Lacosamide 100 MG Tabs Take 100 mg by mouth 2 (two) times daily.   lamoTRIgine 200 MG tablet Commonly known as: LaMICtal Take 1 tablet (200 mg total) by mouth 2 (  two) times daily.   levETIRAcetam 1000 MG tablet Commonly known as: KEPPRA Take 1 tablet (1,000 mg total) by mouth 2 (two) times daily. What changed:  medication strength how much to take   multivitamin with minerals Tabs tablet Place 1 tablet into feeding tube daily. What changed: how to take this   sertraline 100 MG tablet Commonly known as: ZOLOFT Take 1 tablet (100 mg total) by mouth daily.   thiamine 100 MG tablet Take 1 tablet (100 mg total) by mouth daily.         Follow-up Information     Allwardt, Alyssa M, PA-C. Schedule an appointment as soon as possible for a visit in 1 week(s).   Specialty: Physician Assistant Why: Also follow-up with your neurologist within a week Contact information: [address] [place] [address] [phone]                 Major procedures and Radiology Reports - PLEASE review detailed and final reports thoroughly  -       CT HEAD WO CONTRAST ( )  Result Date: 05/03/2021 CLINICAL DATA:  Altered mental status.  Headache. EXAM: CT HEAD WITHOUT CONTRAST TECHNIQUE: Contiguous axial images were obtained from the base of the skull through the vertex without intravenous contrast. COMPARISON:  April 28, 2021. FINDINGS: Brain: No evidence of acute infarction, hemorrhage, hydrocephalus, extra-axial collection or mass lesion/mass effect. Vascular: No hyperdense vessel or unexpected calcification. Skull: Normal. Negative for fracture or focal lesion. Sinuses/Orbits: No acute finding. Other: None. IMPRESSION: No acute intracranial abnormality  seen. Electronically Signed   By: Lupita Raider M.D.   On: 05/03/2021 16:29   CT HEAD WO CONTRAST ( )  Result Date: 04/28/2021 CLINICAL DATA:  Seizure, abnormal neuro exam. Additional provided: Focal seizure activity. EXAM: CT HEAD WITHOUT CONTRAST TECHNIQUE: Contiguous axial images were obtained from the base of the skull through the vertex without intravenous contrast. COMPARISON:  Brain MRI 10/22/2020. FINDINGS: Brain: Cerebral volume is normal. There is no acute intracranial hemorrhage. No acute demarcated cortical infarct. No extra-axial fluid collection. No evidence of an intracranial mass. No midline shift. Vascular: No hyperdense vessel. Skull: Normal. Negative for fracture or focal lesion. Sinuses/Orbits: Visualized orbits show no acute finding. No significant paranasal sinus disease at the imaged levels. IMPRESSION: No evidence of acute intracranial abnormality. Please refer to the brain MRI of 10/22/2020 for a description of small regions of cortical/subcortical T2/FLAIR hyperintense signal abnormality demonstrated within the left parietal and occipital lobes on this prior exam. Electronically Signed   By: Jackey Loge D.O.   On: 04/28/2021 18:01   MR BRAIN WO CONTRAST  Result Date: 04/29/2021 CLINICAL DATA:  Seizure, nontraumatic. EXAM: MRI HEAD WITHOUT CONTRAST TECHNIQUE: Multiplanar, multiecho pulse sequences of the brain and surrounding structures were obtained without intravenous contrast. COMPARISON:  Head CT 04/28/2021.  Brain MRI 10/22/2020. FINDINGS: Brain: Cerebral volume is normal. Very subtle focus of cortical T2/FLAIR hyperintense signal abnormality within the left parietal lobe (for instance as seen on series 11, image 17). Signal abnormality was previously present at the site on the brain MRI of 10/22/2020 and this likely reflects a small focus of cortical encephalomalacia from a prior episode of posterior reversible encephalopathy syndrome (PRES). Redemonstrated punctate chronic  microhemorrhages within the posterior right frontal lobe and right parietal lobe. No other focal parenchymal signal abnormality is identified. The hippocampi are symmetric in size and signal. There is no acute infarct. No evidence of an intracranial mass. No extra-axial fluid collection. No  midline shift. Vascular: Maintained flow voids within the proximal large arterial vessels. Skull and upper cervical spine: No focal suspicious marrow lesion. Sinuses/Orbits: Visualized orbits show no acute finding. No significant paranasal sinus disease. IMPRESSION: Very subtle focus of cortical T2/FLAIR hyperintense signal abnormality within the left parietal lobe. Signal abnormality was previously present at the site on the brain MRI of 10/22/2020, and this likely reflects a small focus of chronic cortical encephalomalacia from a prior episode of posterior reversible encephalopathy syndrome (PRES). Unchanged punctate chronic microhemorrhages within the right frontal and parietal lobes. Otherwise unremarkable non-contrast MRI appearance of the brain. Electronically Signed   By: Jackey LogeKyle  Golden D.O.   On: 04/29/2021 17:48   MR BRAIN W WO CONTRAST  Result Date: 05/04/2021 CLINICAL DATA:  Initial evaluation for seizure, abnormal neuro exam. EXAM: MRI HEAD WITHOUT AND WITH CONTRAST TECHNIQUE: Multiplanar, multiecho pulse sequences of the brain and surrounding structures were obtained without and with intravenous contrast. CONTRAST:  5mL GADAVIST GADOBUTROL 1 MMOL/ML IV SOLN COMPARISON:  Head CT from 05/03/2021 and recent brain MRI from 04/29/2021. FINDINGS: Brain: Examination moderately degraded by motion artifact. Stable cerebral volume, which remains within normal limits. Probable subtle residual FLAIR signal abnormality within the left parietal lobe related to prior PRES again noted, grossly stable. No signal changes to suggest acute PRES at this time. No abnormal foci of restricted diffusion to suggest acute or subacute  ischemia or changes related to seizure. Gray-white matter differentiation maintained. No acute intracranial hemorrhage. Few scattered chronic micro hemorrhages again noted within the posterior right frontoparietal region No mass lesion, midline shift or mass effect. No hydrocephalus or extra-axial fluid collection. Pituitary gland suprasellar region within normal limits. No visible intrinsic temporal lobe abnormality on this motion degraded exam. No appreciable abnormal enhancement. Vascular: Major intracranial vascular flow voids are maintained. Skull and upper cervical spine: Craniocervical junction within normal limits. Bone marrow signal intensity within normal limits. No scalp soft tissue abnormality. Sinuses/Orbits: Globes and orbital soft tissues demonstrate no acute finding. Paranasal sinuses are largely clear. No significant mastoid effusion. Inner ear structures grossly normal. Other: None. IMPRESSION: 1. Motion degraded exam. 2. Grossly stable brain MRI from 04/29/2021. No evidence for acute PRES or other abnormality. Electronically Signed   By: Rise MuBenjamin  McClintock M.D.   On: 05/04/2021 04:29   EEG adult  Result Date: 05/04/2021 Charlsie QuestYadav, Priyanka O, MD     05/04/2021 11:29 AM Patient Name: Donna Short MRN: [id number] Epilepsy Attending: Charlsie QuestPriyanka O Yadav Referring Physician/Provider: Dr Milon DikesAshish Arora Date: 05/04/2021 Duration: 22.20 mins Patient history: 30 year old with above past medical history that includes uncontrolled hypertension, seizures, questionable compliance to medications, alcohol abuse, recent admission for breakthrough seizures-question medication compliance versus alcohol withdrawal, presenting for evaluation of left-sided headache and altered mental status that he has been ongoing for a week. EEG to evaluate for seizure Level of alertness: Awake, asleep AEDs during EEG study: LTG,LEV Technical aspects: This EEG study was done with scalp electrodes positioned according to the 10-20  International system of electrode placement. Electrical activity was acquired at a sampling rate of 500Hz  and reviewed with a high frequency filter of 70Hz  and a low frequency filter of 1Hz . EEG data were recorded continuously and digitally stored. Description: The posterior dominant rhythm consists of 9-10 Hz activity of moderate voltage (25-35 uV) seen predominantly in posterior head regions, symmetric and reactive to eye opening and eye closing. Sleep was characterized by vertex waves, sleep spindles (12 to 14 Hz), maximal frontocentral region.   EEG  showed abundant sharp waves and intermittent 3-5hz  theta-delta slowing in left posterior temporal region, maximal P7. Physiologic photic driving was seen during photic stimulation.  Hyperventilation was  not performed.   ABNORMALITY -Sharp wave, left posterior temporal region - Intermittent slow, left posterior temporal region IMPRESSION: This study is consistent with patient's known history of epileptogenicity and cortical dysfunction arising from left posterior temporal region. No seizures were seen throughout the recording. [name]   EEG adult  Result Date: 04/28/2021 [name], MD     04/28/2021  8:00 PM Patient Name: Donna Short MRN: [id number] Epilepsy Attending: [name] Referring Physician/Provider: Dr Bing Neighbors Date: 04/28/2021 Duration: 24.14 mins Patient history:  30yo F with history of multiple admissions with 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  She has persistent deficits in the ER, remains extremely confused, and has had at least 1 episode in the ED concerning for focal seizure in the right upper extremity since arrival. EEG to evaluate for seizure Level of alertness: Awake, asleep AEDs during EEG study: LEV, LCM Technical aspects: This EEG study was done with scalp electrodes positioned according to the 10-20 International system of electrode placement. Electrical activity was acquired at a sampling  rate of  and reviewed with a high frequency filter of  and a low frequency filter of . EEG data were recorded continuously and digitally stored. Description: No clear posterior dominant rhythm was seen. Sleep was characterized by sleep spindles (12 to 14 Hz), maximal frontocentral region. EEG showed abundant spikes in left posterior temporal region, maximal P7 at times periodic at 0.5-1Hz . There were also brief 1 seconds periods of eeg attenuation after a run of polyspikes. EEG also showed continuous  generalized and  lateralized left hemisphere 3 to 6 Hz theta-delta slowing with overriding 15-18hz  beta activity. Hyperventilation and photic stimulation were not performed.   ABNORMALITY - Spike, left posterior temporal region - Continuous slow, generalized and lateralized left  IMPRESSION: This study showed evidence of epileptogenicity arising from left posterior temporal region as well as cortical dysfunction in left hemisphere due to underlying structural abnormality, post-ictal state. Additionally, there is evidence of moderate diffuse encephalopathy, nonspecific etiology but likely related to seizure.  Dr Selina Cooley was notified  [name]   Overnight EEG with video  Result Date: 04/29/2021 [name], MD     04/29/2021 12:58 PM Patient Name: Donna Short MRN: [id number] Epilepsy Attending: [name] Referring Physician/Provider: Dr Bing Neighbors Duration: 04/28/2021 1956 to 04/29/2021 1138  Patient history:  30yo F with history of multiple admissions with 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  She has persistent deficits in the ER, remains extremely confused, and has had at least 1 episode in the ED concerning for focal seizure in the right upper extremity since arrival. EEG to evaluate for seizure  Level of alertness: Awake, asleep  AEDs during EEG study: LEV, LCM  Technical aspects: This EEG study was done with scalp electrodes positioned according to the 10-20  International system of electrode placement. Electrical activity was acquired at a sampling rate of  and reviewed with a high frequency filter of  and a low frequency filter of . EEG data were recorded continuously and digitally stored.  Description: No clear posterior dominant rhythm was seen. Sleep was characterized by sleep spindles (12 to 14 Hz), maximal frontocentral region. EEG showed abundant spikes in left posterior temporal region, maximal P7 at times periodic at 0.5-1Hz . There were also brief 1 seconds  periods of eeg attenuation after a run of polyspikes. EEG also showed continuous  generalized and  lateralized left hemisphere 3 to 6 Hz theta-delta slowing with overriding 15-18hz  beta activity. Hyperventilation and photic stimulation were not performed.    ABNORMALITY - Spike, left posterior temporal region - Continuous slow, generalized and lateralized left  IMPRESSION: This study showed evidence of epileptogenicity arising from left posterior temporal region as well as cortical dysfunction in left hemisphere due to underlying structural abnormality, post-ictal state. Additionally, there is evidence of moderate diffuse encephalopathy, nonspecific etiology but likely related to seizure. No definite seizures were noted  Priyanka [name]    Today   Subjective    Donna Short today has no headache,no chest abdominal pain,no new weakness tingling or numbness, feels much better wants to go home today.     Objective   Blood pressure 118/88, pulse 89, temperature 98.8 F (37.1 C), temperature source Oral, resp. rate 18, height 5\' 2"  (1.575 m), weight 52.2 kg, SpO2 99 %.   Intake/Output Summary (Last 24 hours) at 05/05/2021 1010 Last data filed at 05/04/2021 2000 Gross per 24 hour  Intake 100 ml  Output --  Net 100 ml    Exam  Awake Alert, No new F.N deficits, Normal affect [place].AT,PERRAL Supple Neck,No JVD, No cervical lymphadenopathy appriciated.  Symmetrical Chest wall  movement, Good air movement bilaterally, CTAB RRR,No Gallops,Rubs or new Murmurs, No Parasternal Heave +ve B.Sounds, Abd Soft, Non tender, No organomegaly appriciated, No rebound -guarding or rigidity. No Cyanosis, Clubbing or edema, No new Rash or bruise   Data Review   CBC w Diff:  Lab Results  Component Value Date   WBC 7.5 05/04/2021   HGB 12.7 05/04/2021   HGB 11.9 11/01/2020   HCT 37.1 05/04/2021   HCT 34.3 11/01/2020   PLT 235 05/04/2021   LYMPHOPCT 20 05/03/2021   MONOPCT 8 05/03/2021   EOSPCT 0 05/03/2021   BASOPCT 0 05/03/2021    CMP:  Lab Results  Component Value Date   NA 132 (L) 05/04/2021   NA 141 11/01/2020   K 3.5 05/04/2021   CL 95 (L) 05/04/2021   CO2 23 05/04/2021   BUN 16 05/04/2021   BUN 14 11/01/2020   CREATININE 0.90 05/04/2021   CREATININE 0.91 05/04/2021   CREATININE 0.79 06/27/2020   PROT 9.7 (H) 05/03/2021   PROT 7.8 11/01/2020   ALBUMIN 5.2 (H) 05/03/2021   ALBUMIN 4.4 11/01/2020   BILITOT 0.6 05/03/2021   BILITOT <0.2 11/01/2020   ALKPHOS 45 05/03/2021   AST 31 05/03/2021   ALT 19 05/03/2021  .   Total Time in preparing paper work, data evaluation and todays exam - 35 minutes  07/03/2021 M.D on 05/05/2021 at 10:10 AM  [facility]

## 2021-05-05 NOTE — Progress Notes (Signed)
Discharge paperwork reviewed with patient, patient verbalized understanding. Patient alert and oriented, and no acute distress noted upon discharge. Patient has taken all belongings with her. Patient wheeled down to main entranced via w/c to be discharged. Patients friend to transport her back home via motor vehicle.

## 2021-05-05 NOTE — Discharge Instructions (Addendum)
Do not drive, operate heavy machinery, perform activities at heights, swimming or participation in water activities or provide baby sitting services until you have seen by Primary MD or a Neurologist and advised to do so again.  Follow with Primary MD Allwardt, Crist Infante, PA-C and your Neurologist in 7 days   Get CBC, CMP, B12, B1, TSH, checked next visit within 1 week by Primary MD    Activity: As tolerated with Full fall precautions use walker/cane & assistance as needed  Disposition Home    Diet: Heart Healthy    Special Instructions: If you have smoked or chewed Tobacco  in the last 2 yrs please stop smoking, stop any regular Alcohol  and or any Recreational drug use.  On your next visit with your primary care physician please Get Medicines reviewed and adjusted.  Please request your Prim.MD to go over all Hospital Tests and Procedure/Radiological results at the follow up, please get all Hospital records sent to your Prim MD by signing hospital release before you go home.  If you experience worsening of your admission symptoms, develop shortness of breath, life threatening emergency, suicidal or homicidal thoughts you must seek medical attention immediately by calling 911 or calling your MD immediately  if symptoms less severe.  You Must read complete instructions/literature along with all the possible adverse reactions/side effects for all the Medicines you take and that have been prescribed to you. Take any new Medicines after you have completely understood and accpet all the possible adverse reactions/side effects.

## 2021-05-06 ENCOUNTER — Other Ambulatory Visit: Payer: Self-pay | Admitting: Family Medicine

## 2021-05-06 LAB — T3, FREE: T3, Free: 5.4 pg/mL — ABNORMAL HIGH (ref 2.0–4.4)

## 2021-05-09 LAB — VITAMIN B1: Vitamin B1 (Thiamine): 248.7 nmol/L — ABNORMAL HIGH (ref 66.5–200.0)

## 2021-05-16 ENCOUNTER — Encounter: Payer: Managed Care, Other (non HMO) | Admitting: Physician Assistant

## 2021-09-02 ENCOUNTER — Ambulatory Visit: Payer: Managed Care, Other (non HMO) | Admitting: Neurology

## 2021-09-22 ENCOUNTER — Other Ambulatory Visit: Payer: Self-pay | Admitting: Family Medicine

## 2022-05-13 ENCOUNTER — Other Ambulatory Visit: Payer: Self-pay | Admitting: Neurology

## 2022-05-20 ENCOUNTER — Other Ambulatory Visit: Payer: Self-pay | Admitting: Neurology

## 2022-05-20 MED ORDER — LEVETIRACETAM 500 MG PO TABS: 500.0000 mg | ORAL_TABLET | Freq: Two times a day (BID) | ORAL | 0 refills | Status: DC

## 2022-05-20 NOTE — Telephone Encounter (Signed)
Pt is calling. Requesting a refill on  levETIRAcetam (KEPPRA) 1000 MG tablet. Pt made a appointment for 10/26 @ 10:30 am with Dr. Krista Blue. Pt is requesting enough medication until her appointment. Pt is requesting refill be sent to [facility] [phone].

## 2022-05-20 NOTE — Telephone Encounter (Signed)
I spoke with the patient to clarify medication. In the last office visit note with Dr. Krista Blue the patient was taking levetiracetam 750 MG BID. The medication was most recently filled by Dr. Candiss Norse for 1000 MG BID.  The patient reported that as per Dr. Rhea Belton instruction she had been taking levetiracetam 500 MG BID.  She has been experiencing some "twitching" episodes on this dosage, but states the higher doses of levetiracetam were too much.

## 2022-05-28 ENCOUNTER — Emergency Department (HOSPITAL_COMMUNITY)
Admission: EM | Admit: 2022-05-28 | Discharge: 2022-05-28 | Disposition: A | Discharge status: Home / Self Care | Payer: Managed Care, Other (non HMO) | Attending: Emergency Medicine | Admitting: Emergency Medicine

## 2022-05-28 ENCOUNTER — Encounter (HOSPITAL_COMMUNITY): Payer: Self-pay

## 2022-05-28 DIAGNOSIS — F10129 Alcohol abuse with intoxication, unspecified: Secondary | ICD-10-CM | POA: Diagnosis not present

## 2022-05-28 DIAGNOSIS — R569 Unspecified convulsions: Secondary | ICD-10-CM | POA: Diagnosis not present

## 2022-05-28 DIAGNOSIS — R410 Disorientation, unspecified: Secondary | ICD-10-CM | POA: Insufficient documentation

## 2022-05-28 DIAGNOSIS — Z91040 Latex allergy status: Secondary | ICD-10-CM | POA: Diagnosis not present

## 2022-05-28 DIAGNOSIS — R519 Headache, unspecified: Secondary | ICD-10-CM | POA: Diagnosis not present

## 2022-05-28 DIAGNOSIS — F10920 Alcohol use, unspecified with intoxication, uncomplicated: Secondary | ICD-10-CM

## 2022-05-28 DIAGNOSIS — Z91010 Allergy to peanuts: Secondary | ICD-10-CM | POA: Insufficient documentation

## 2022-05-28 LAB — COMPREHENSIVE METABOLIC PANEL
ALT: 47 U/L — ABNORMAL HIGH (ref 0–44)
AST: 81 U/L — ABNORMAL HIGH (ref 15–41)
Albumin: 4 g/dL (ref 3.5–5.0)
Alkaline Phosphatase: 46 U/L (ref 38–126)
Anion gap: 18 — ABNORMAL HIGH (ref 5–15)
BUN: 13 mg/dL (ref 6–20)
CO2: 16 mmol/L — ABNORMAL LOW (ref 22–32)
Calcium: 9.4 mg/dL (ref 8.9–10.3)
Chloride: 107 mmol/L (ref 98–111)
Creatinine, Ser: 1.03 mg/dL — ABNORMAL HIGH (ref 0.44–1.00)
GFR, Estimated: >60 mL/min (ref >60)
Glucose, Bld: 90 mg/dL (ref 70–99)
Potassium: 3.7 mmol/L (ref 3.5–5.1)
Sodium: 141 mmol/L (ref 135–145)
Total Bilirubin: 0.4 mg/dL (ref 0.3–1.2)
Total Protein: 8.2 g/dL — ABNORMAL HIGH (ref 6.5–8.1)

## 2022-05-28 LAB — RAPID URINE DRUG SCREEN, HOSP PERFORMED
Amphetamines: NOT DETECTED
Barbiturates: NOT DETECTED
Benzodiazepines: NOT DETECTED
Cocaine: NOT DETECTED
Opiates: NOT DETECTED
Tetrahydrocannabinol: NOT DETECTED

## 2022-05-28 LAB — URINALYSIS, ROUTINE W REFLEX MICROSCOPIC
Bacteria, UA: NONE SEEN
Bilirubin Urine: NEGATIVE
Glucose, UA: NEGATIVE mg/dL
Ketones, ur: NEGATIVE mg/dL
Leukocytes,Ua: NEGATIVE
Nitrite: NEGATIVE
Protein, ur: 100 mg/dL — AB
Specific Gravity, Urine: 1.012 (ref 1.005–1.030)
pH: 6 (ref 5.0–8.0)

## 2022-05-28 LAB — POC URINE PREG, ED: Preg Test, Ur: NEGATIVE

## 2022-05-28 LAB — CBC WITH DIFFERENTIAL/PLATELET
Abs Immature Granulocytes: 0.02 10*3/uL (ref 0.00–0.07)
Basophils Absolute: 0 10*3/uL (ref 0.0–0.1)
Basophils Relative: 0 %
Eosinophils Absolute: 0.1 10*3/uL (ref 0.0–0.5)
Eosinophils Relative: 1 %
HCT: 43.8 % (ref 36.0–46.0)
Hemoglobin: 15.2 g/dL — ABNORMAL HIGH (ref 12.0–15.0)
Immature Granulocytes: 0 %
Lymphocytes Relative: 50 %
Lymphs Abs: 3.8 10*3/uL (ref 0.7–4.0)
MCH: 37.1 pg — ABNORMAL HIGH (ref 26.0–34.0)
MCHC: 34.7 g/dL (ref 30.0–36.0)
MCV: 106.8 fL — ABNORMAL HIGH (ref 80.0–100.0)
Monocytes Absolute: 0.5 10*3/uL (ref 0.1–1.0)
Monocytes Relative: 6 %
Neutro Abs: 3.4 10*3/uL (ref 1.7–7.7)
Neutrophils Relative %: 43 %
Platelets: 209 10*3/uL (ref 150–400)
RBC: 4.1 MIL/uL (ref 3.87–5.11)
RDW: 12.1 % (ref 11.5–15.5)
WBC: 7.8 10*3/uL (ref 4.0–10.5)
nRBC: 0 % (ref 0.0–0.2)

## 2022-05-28 LAB — ETHANOL: Alcohol, Ethyl (B): 291 mg/dL — ABNORMAL HIGH (ref <10)

## 2022-05-28 LAB — CBG MONITORING, ED: Glucose-Capillary: 110 mg/dL — ABNORMAL HIGH (ref 70–99)

## 2022-05-28 MED ORDER — SODIUM CHLORIDE 0.9 % IV BOLUS
1000.0000 mL | Freq: Once | INTRAVENOUS | Status: AC
  Administered 2022-05-28: 1000 mL via INTRAVENOUS

## 2022-05-28 MED ORDER — LEVETIRACETAM IN NACL 1000 MG/100ML IV SOLN
1000.0000 mg | Freq: Once | INTRAVENOUS | Status: AC
  Administered 2022-05-28: 1000 mg via INTRAVENOUS
  Filled 2022-05-28: qty 100

## 2022-05-28 MED ORDER — LORAZEPAM 2 MG/ML IJ SOLN
1.0000 mg | Freq: Once | INTRAMUSCULAR | Status: AC
  Administered 2022-05-28: 1 mg via INTRAVENOUS
  Filled 2022-05-28: qty 1

## 2022-05-28 MED ORDER — LEVETIRACETAM 500 MG PO TABS: 500.0000 mg | ORAL_TABLET | Freq: Two times a day (BID) | ORAL | 0 refills | Status: DC

## 2022-05-28 MED ORDER — LEVETIRACETAM IN NACL 1500 MG/100ML IV SOLN
1500.0000 mg | Freq: Two times a day (BID) | INTRAVENOUS | Status: DC
  Administered 2022-05-28: 1500 mg via INTRAVENOUS
  Filled 2022-05-28 (×2): qty 100

## 2022-05-28 NOTE — Discharge Instructions (Addendum)
Evaluation for your seizure-like activity revealed that your blood alcohol level was significantly elevated.  As you may know, alcohol can increase your risk of having a seizure especially when drinking is excessive.  That said, I am encouraged that you are overall feeling better and have not had a seizure in the last few hours.  I have refilled your Keppra which is your antiseizure medication.  I recommend that you follow-up with your neurologist as soon as possible ideally in the next few days.  Also recommend that you avoid drinking alcohol as much as possible.  If you have another seizure, new headache, facial droop, slurred speech, weakness or tingling in your extremities please return to the emergency department for further evaluation.

## 2022-05-28 NOTE — ED Notes (Signed)
Pt was speaking with this NT and mid sentence their eyes started to flutter and their head shook from side to side. Lasted approx. 1 minute and RN and provider were notified.

## 2022-05-28 NOTE — ED Provider Notes (Addendum)
[facility] EMERGENCY DEPARTMENT Provider Note   CSN: [id number] Arrival date & time: 05/28/22  1120     History  Chief Complaint  Patient presents with   Seizures    Donna Short is a 31 y.o. female.  Pt reports she ran out of keppra and has not taken for 2 days.  Pt is here with her Godfather.  He reports pt called him because she thought she was going to have a seizure.  Pt is reported to have had a seizure today in the lobby.   The history is provided by the patient. No language interpreter was used.  Seizures Seizure activity on arrival: yes   Seizure type:  Unable to specify Preceding symptoms: headache   Episode characteristics: abnormal movements   Postictal symptoms: confusion   Progression:  Worsening PTA treatment:  None      Home Medications Prior to Admission medications   Medication Sig Start Date End Date Taking? Authorizing Provider  levETIRAcetam (KEPPRA) 500 MG tablet Take 1 tablet (500 mg total) by mouth 2 (two) times daily. 05/20/22   Marcial Pacas, MD  acetaminophen (TYLENOL) 500 MG tablet Take 1,000 mg by mouth every 6 (six) hours as needed for moderate pain.    [provider]  Albuterol Sulfate (PROAIR RESPICLICK) 027 (90 Base) MCG/ACT AEPB Inhale 2 puffs into the lungs daily as needed (wheezing).    [provider]  amLODipine (NORVASC) 5 MG tablet TAKE ONE TABLET BY MOUTH DAILY 05/06/21   Allwardt, Randa Evens, PA-C  folic acid (FOLVITE) 1 MG tablet TAKE ONE TABLET BY MOUTH DAILY Patient taking differently: Take 1 mg by mouth daily. 01/22/21   Orma Flaming, MD  Lacosamide 100 MG TABS Take 100 mg by mouth 2 (two) times daily.    [provider]  lamoTRIgine (LAMICTAL) 200 MG tablet Take 1 tablet (200 mg total) by mouth 2 (two) times daily. 02/21/21   Marcial Pacas, MD  levETIRAcetam (KEPPRA) 1000 MG tablet Take 1 tablet (1,000 mg total) by mouth 2 (two) times daily. 05/05/21   Thurnell Lose, MD  Multiple  Vitamin (MULTIVITAMIN WITH MINERALS) TABS tablet Place 1 tablet into feeding tube daily. Patient taking differently: Take 1 tablet by mouth daily. 06/01/20   Donita Brooks, NP  sertraline (ZOLOFT) 100 MG tablet Take 1 tablet (100 mg total) by mouth daily. 02/21/21   Marcial Pacas, MD  thiamine 100 MG tablet Take 1 tablet (100 mg total) by mouth daily. 05/01/21   Eugenie Filler, MD  vitamin B-12 1000 MCG tablet Take 1 tablet (1,000 mcg total) by mouth daily. 05/12/21   Thurnell Lose, MD      Allergies    Claritin [loratadine], Peanut-containing drug products, Benadryl [diphenhydramine hcl (sleep)], Latex, and Shrimp [shellfish allergy]    Review of Systems   Review of Systems  Neurological:  Positive for seizures.  All other systems reviewed and are negative.   Physical Exam Updated Vital Signs BP (!) 133/93   Pulse (!) 123   Temp 98.7 F (37.1 C) (Oral)   Resp 18   Ht 5\' 2"  (1.575 m)   Wt 58.1 kg   SpO2 100%   BMI 23.41 kg/m  Physical Exam Vitals and nursing note reviewed.  Constitutional:      Appearance: She is well-developed.  HENT:     Head: Normocephalic.     Right Ear: External ear normal.     Left Ear: External ear normal.  Nose: Nose normal.     Mouth/Throat:     Mouth: Mucous membranes are moist.  Eyes:     Extraocular Movements: Extraocular movements intact.     Pupils: Pupils are equal, round, and reactive to light.  Cardiovascular:     Rate and Rhythm: Normal rate.  Pulmonary:     Effort: Pulmonary effort is normal.  Abdominal:     General: There is no distension.  Musculoskeletal:        General: Normal range of motion.     Cervical back: Normal range of motion.  Skin:    General: Skin is warm.  Neurological:     General: No focal deficit present.     Mental Status: She is alert.  Psychiatric:        Mood and Affect: Mood normal.        Behavior: Behavior normal.     ED Results / Procedures / Treatments   Labs (all labs ordered are  listed, but only abnormal results are displayed) Labs Reviewed  CBG MONITORING, ED - Abnormal; Notable for the following components:      Result Value   Glucose-Capillary 110 (*)    All other components within normal limits  CBC WITH DIFFERENTIAL/PLATELET  COMPREHENSIVE METABOLIC PANEL  LEVETIRACETAM LEVEL  URINALYSIS, ROUTINE W REFLEX MICROSCOPIC  RAPID URINE DRUG SCREEN, HOSP PERFORMED  ETHANOL  POC URINE PREG, ED    EKG None  Radiology No results found.  Procedures Procedures    Medications Ordered in ED Medications  levETIRAcetam (KEPPRA) IVPB 1500 mg/ 100 mL premix (has no administration in time range)  sodium chloride 0.9 % bolus 1,000 mL (1,000 mLs Intravenous New Bag/Given 05/28/22 1148)    ED Course/ Medical Decision Making/ A&P                           Medical Decision Making Pt complains of feeling like she was going to have a seizure.  Pt brought here by family member.  Pt reported to have had a seizure in the lobby while waiting.    Amount and/or Complexity of Data Reviewed Labs: ordered. Decision-making details documented in ED Course.    Details: Etoh 291.    Risk Prescription drug management. Risk Details: Pt given IV keppra.  Pt's etoh is 291.          Pt's care turned over to oncoming provider , Riki Sheer PAC at 3pm.    Final Clinical Impression(s) / ED Diagnoses Final diagnoses:  Alcoholic intoxication without complication [facility])  Seizure [facility])    Rx / DC Orders ED Discharge Orders     None         Osie Cheeks 05/28/22 1434    Osie Cheeks 05/28/22 1506    Ernie Avena, MD 05/28/22 1907

## 2022-05-28 NOTE — ED Notes (Signed)
Pt ambulatory to and from restroom with steady gait 

## 2022-05-28 NOTE — ED Provider Notes (Cosign Needed Addendum)
Accepted handoff at shift change from [facility]. Please see prior provider note for more detail.   Briefly: Patient is 31 y.o.   DDX: concern for seizure vs intoxication  Plan: stays a couple of hours, check keppra load,    Physical Exam  BP 120/84   Pulse (!) 111   Temp 98.7 F (37.1 C) (Oral)   Resp (!) 21   Ht 5\' 2"  (1.575 m)   Wt 58.1 kg   SpO2 97%   BMI 23.41 kg/m   Physical Exam  Procedures  Procedures  ED Course / MDM   Clinical Course as of 05/28/22 2237  Wed May 28, 2022  1800 Reevaluated patient who stated that she felt much better.  She was neuro appropriate at the time endorsing no concerning focal neuro symptoms.  I mentioned that we would likely discharge her in a few minutes at which she responded that she would like to stay for the night if possible.  I stated that at this time there is no clinical indication for her to stay.  About 5 minutes later patient stated that her right eye was "foggy" which she stated is what happens when she has a seizure.  Around 1815, nurse tech informed me that she was seizing.  I came into the room patient was not in a postictal state and was not actively seizing.  Neuro exam was unremarkable. [JR]  41 Handoff PA to PA. Stable. Known  seizures. Intoxicated. Handoff plan was keppra bolus and DC and rec OP follow up.  [facility] [facility], advised to administer 1 g IV Keppra and observe her for a couple more hours.  If no concern for seizure, advised to discharge home on 1 g twice daily of oral Keppra with close follow-up with neurologist. [JR]  2236 After evaluation for couple hours after her second Keppra load, patient states she is feeling better was able to ambulate to and from the bathroom. [JR]    Clinical Course User Index [CC] Tretha Sciara, MD [JR] Harriet Pho, PA-C   Medical Decision Making Amount and/or Complexity of Data Reviewed Labs: ordered.  Risk Prescription drug  management.   Dispo: Discharged with Keppra refill for known seizures and follow-up with neurologist      Harriet Pho, PA-C 05/28/22 1803    Harriet Pho, PA-C 05/28/22 1803    Harriet Pho, PA-C 05/28/22 2238    Tretha Sciara, MD 05/29/22 [phone]

## 2022-05-28 NOTE — ED Triage Notes (Signed)
Pt arrived POV, godfather brought her to hospital. Pt has hx of seizures, states she has not had her Keppra for a few days. Pt states she did not have a seizure prior to arrival. She reports feeling like she was going to so her godfather brought her to the ER. She states her right side felt fizzy, similar to other times she has had a seizure.

## 2022-05-28 NOTE — ED Notes (Signed)
Nurse tech reports pt had seizure like activity lasting approx 1 minute; pt alert on this RN's arrival to room, oriented x 4, following commands and answering questions appropriately

## 2022-06-19 ENCOUNTER — Encounter: Payer: Self-pay | Admitting: Neurology

## 2022-06-19 ENCOUNTER — Ambulatory Visit (INDEPENDENT_AMBULATORY_CARE_PROVIDER_SITE_OTHER): Payer: Managed Care, Other (non HMO) | Admitting: Neurology

## 2022-06-19 VITALS — BP 178/135 | HR 108 | Ht 62.0 in | Wt 129.5 lb

## 2022-06-19 DIAGNOSIS — F1010 Alcohol abuse, uncomplicated: Secondary | ICD-10-CM | POA: Diagnosis not present

## 2022-06-19 DIAGNOSIS — R569 Unspecified convulsions: Secondary | ICD-10-CM

## 2022-06-19 MED ORDER — LEVETIRACETAM 500 MG PO TABS: 500.0000 mg | ORAL_TABLET | Freq: Two times a day (BID) | ORAL | 3 refills | Status: DC

## 2022-06-19 NOTE — Progress Notes (Signed)
Chief Complaint  Patient presents with   Follow-up    Rm 15. Alone. Hospital follow up. Taking levetiracetam 500 mg BID, needs refills. Discuss taking tylenol with levetiracetam.    ASSESSMENT AND PLAN  Donna Short is a 31 y.o. 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  Video EEG monitoring March 1 to 5 2022.  This study initially showed frequent seizures (average 2 to 3/h) which gradually improved after adjusting AEDs. Total 12 seizures were noted between 1130 on 10/23/2020  to 10/24/2020 0924. Patient was noted to be confused during the seizures. Seizures are arising from left posterior quadrant, last seizure on 10/24/2020 at 0858, average duration of seizure about 3 minutes.  EEG also showed evidence of epileptogenicity arising from left posterior quadrant due to underlying structural abnormality.   Lora Havens    I personally reviewed MRI of the brain with without contrast on October 22, 2020, in comparison with previous MRI, small region of cortical/subcortical T2/flair hyperintensity within the left parietal and occipital lobes, this findings were not present at previous MRI of October 25, 2019, primary consideration in this patient including early signal changes related to hypertensive encephalopathy/press versus subtle chronic  encephalomalacia from previous episode, stable chronic microhemorrhage within the right frontal and parietal lobe, mild cerebral and cerebellar atrophy, advanced for her age  Laboratory evaluation in March 2022, lacosamide level 7.2, phenobarbital 34, Dilantin 1.9, Keppra 35   HISTORICAL  Donna Short is here with her father, following for seizure, recent hospital discharge September 16 of May 31, 2020, she was in nonconvulsive status epilepticus, was put into burst suppression, following her acute pancreatitis  I saw her initially for seizure on December 19 2015.   On April 14th 2017,She had acute onset generalized tonic-clonic seizure at work in [employer], she woke up in the ambulance, 3 hours later, while at emergency room, she had her second generalized tonic-clonic seizure, she has been treated with Keppra 500 mg twice a day, tolerating medication well.   CT scan of the brain in April 2017, no intra-cranial abnormality, right parietal skull abrasion,   Laboratory evaluation showed mild anemia, with hemoglobin 11.8, normal CMP, negative pregnancy test.   She denies focal symptoms, she was developmentally normal, denier history of febrile seizure, there was no family history of epilepsy.   She works at [employer] for [employer]   EEG was normal in May 27th 2017.  She could not afford MRI brain.  While taking Keppra 500mg  bid,  Noticed irritability, anxiety, also complains work-related anxiety, we switched her to lamotrigine from [place]  She has lost follow-up since last visit in May 2017, return to clinic again on February 12, 2018 she was supposed to take lamotrigine 100 mg twice a day, but not compliant with her medications, she was admitted to the hospital in July 2019 for acute pancreatitis, related to alcohol  use, hospital course was also complicated by acute encephalopathy, possible alcohol withdrawal seizure, possible aspiration pneumonia, requiring intubation, during  her hospital stay, there was reported seizure activity,   CT head without contrast on Jan 18, 2018 was normal.   Discharge laboratory on January 28, 2018 showed low potassium 3.3, normal creatinine 0.91,WBC remains elevated 12.9, anemia hemoglobin of 7.7, normal TSH, folic acid, B12 396,   CT abdomen on Jan 20, 2018 showed changes of acute pancreatitis, considerable peripancreatic fluid, and the free fluid in the abdomen, wall thickening within the colon, bilateral pleural effusion, lower lobe consolidation,   She  complains of increased fatigue, with documented blood pressure 94/68, heart rate of 97, she was discharged with prescription of labetalol 200 mg twice a day, hemoglobin of only 9.9, she was switched back to Keppra 500 mg twice daily during her hospital stay  She again lost to follow-up since June 2019,  I reviewed her most recent hospital discharge September 16 through May 31, 2020, acute on chronic pancreatitis, she presented with intractable nausea vomiting after binge drinking, during her hospital stay, had seizure on May 13, 2020, despite higher dose of Keppra, she becomes confused, acute metabolic encephalopathy, alcohol abuse PRES, acute respiratory failure, intubated for burst suppression on September 20, vocal cord swallowing, reintubation September 30, treated with dexamethasone, eventually require tracheostomy on October 4, staphylococcal tracheal aspiration, spiked fever of 105, patient had no recollection of the event, woke up 3 weeks later at the hospital,  I personally reviewed MRI of the brain September 20 and 24, 2021, patchy cortical/subcortical T2/flair hyperintensity involving bilateral frontal, parietal, temporal, occipital region, right worse than left frontal region, and the left temporal occipital region, mild patchy involvement of cerebellum  She was seen by epileptologist Dr. 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 Artis Flock to gradually tapering off Dilantin, keep current dose of Keppra 1000 mg twice a day, phenobarbital 100 mg daily  She was discharged with Keppra 1500 mg 12 hours, phenobarbital 64.8 mg twice a day, Dilantin 75 mg every 8 hours  I discussed with her primary care physician Dr. Artis Flock, on July 12 2020, decided to gradually taper off Dilantin, keep current dose of Keppra 1000 mg twice a day, phenobarbital 100 mg daily  Laboratory evaluation July 12, 2020: Dilantin level was 20.9, phenobarbital 29.7, B12 more than 2000, LDL 156, CMP was normal, glucose was 63, hemoglobin 10.7,  Lipase was normal 24 in October 2021  She is now 80% back to her baseline, lives alone, was brought in by her godfather today, she complains of side effect from polypharmacy treatment, dizziness, sleepiness, is in the process of weaning off Dilantin,  UPDATE November 01 2020: She had sudden loss of consciousness on October 19, 2020 at her work, [employer], next couple days she felt dizzy, lack of appetite, stayed in bed, during the virtual visit on October 22, 2020 with her primary care Dr. Orland Mustard, she was noted to be confused, was sent to the emergency room the same day, she was noted to be confused, abnormal MRI of the brain detailed above, was connected with video EEG monitoring, which showed subclinical seizure, was  seen by Dr. Melynda RippleYadav, required continued titration of multiple and elective medications to resolve her nonconvulsive status epilepticus, was discharged home with polypharmacy, including Keppra 1500 mg  twice a day, Dilantin 100 mg every 8 hours, phenobarbital 95 mg daily, Vimpat 100 mg twice a day, perampanel 4 mg daily  There was no clinical seizure since hospital discharge, no confusion, she complains of excessive fatigue, can only go to sleep after medications, no energy for anything else  UPDATE November 22 2020: She is doing well, no recurrent seizure, she is now on Vimpat 100 mg twice a day, Keppra 750 mg twice a day, phenobarbital 97.2 mg daily  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 [employer], denies significant side effect from medication, but concerning about the medication cost,  UPDATE February 21 2021: Doing very well, taking lamotrigine 100 mg 2 tablets twice a day, Keppra 750 mg twice daily, there was no recurrent seizure-like spell, she was able to go back to her full-time job at [employer] department,  UPDATE Jun 19 2022: Lost follow-up since last visit in June 2022, reviewed hospital admission September 2022, for recurrent seizure, altered mental status, overnight video EEG confirmed epileptogenic focus in the left posterior temporal region, cortical dysfunction, MRI of the brain showed small focus of chronic encephalomalacia, microhemorrhage within the right frontal and parietal lobe,  She was discharged home with higher dose of Keppra 1000 mg twice a day, and lamotrigine 200 mg twice a day, add on Vimpat 100 mg twice a day, sertraline 100 mg for depression, continue to drink alcohol, slight elevation of alcohol level 11  She has stopped Vimpat and lamotrigine since Feb 2023, kept on Keppra 500mg  bid, but only take keppra 500mg  once since May 2023, eventually run out of medications.  She presented to emergency room May 28, 2022, rule out of the Keppra, had recurrent seizure on Oct 4th 2023,  l admission alcohol level 291 May 28, 2022  Personally reviewed repeat MRI of the brain in September 2022, motion degraded imaging, scattered chronic  microhemorrhage within the posterior right frontal parietal region, no acute abnormality  She still works at [employer], some how, she is only take Keppra 500mg  bid  since Oct 4th 2023, feel better this way, also less financial burden  PHYSICAL EXAMNIATION: Vitals:   06/19/22 1035  Weight: 129 lb 8 oz (58.7 kg)  Height: 5\' 2"  (1.575 m)    Gen: NAD, conversant, well nourised, well groomed                     Cardiovascular: Regular rate rhythm, no peripheral edema, warm, nontender. Eyes: Conjunctivae clear without exudates or hemorrhage Neck: Supple, no carotid bruits. Pulmonary: Clear to auscultation bilaterally   NEUROLOGICAL EXAM:  MENTAL STATUS: Speech/cognition: Awake alert oriented to history taking care of conversation   CRANIAL NERVES: CN II: Visual fields are full to confrontation. Pupils are round equal and briskly reactive to light. CN III, IV, VI: extraocular movement are normal. No ptosis. CN V: Facial sensation is intact to light touch CN VII: Face is symmetric with normal eye closure  CN VIII: Hearing is normal to causal conversation. CN IX, X: Phonation is normal. CN XI: Head turning and shoulder shrug are intact  MOTOR: There is no pronator drift of out-stretched arms. Muscle bulk and tone are normal. Muscle strength is normal.  REFLEXES: Reflexes are 2+ and symmetric at the biceps, triceps, knees, and ankles.  Plantar responses are flexor.  SENSORY: Intact to light touch, pinprick and vibratory sensation are intact in fingers and toes.  COORDINATION: There is no trunk or limb dysmetria noted.  GAIT/STANCE: Posture is normal.Gait is normal.  REVIEW OF SYSTEMS: Full 14 system review of systems performed and notable only for as above All other review of systems were negative.  ALLERGIES: Allergies  Allergen Reactions   Claritin [Loratadine] Itching   Peanut-Containing Drug Products Other (See Comments)    Family not sure of reaction   Benadryl  [Diphenhydramine Hcl (Sleep)] Rash   Latex Rash   Shrimp [Shellfish Allergy] Hives and Rash    HOME MEDICATIONS: Current Outpatient Medications  Medication Sig Dispense Refill   acetaminophen (TYLENOL) 500 MG tablet Take 1,000 mg by mouth every 6 (six) hours as needed for moderate pain.     Albuterol Sulfate (PROAIR RESPICLICK) 108 (90 Base) MCG/ACT AEPB Inhale 2 puffs into the lungs daily as needed (wheezing).     ibuprofen (ADVIL) 800 MG tablet Take 800 mg by mouth 3 (three) times daily as needed for headache.     levETIRAcetam (KEPPRA) 500 MG tablet Take 1 tablet (500 mg total) by mouth 2 (two) times daily. 60 tablet 0   No current facility-administered medications for this visit.    PAST MEDICAL HISTORY: Past Medical History:  Diagnosis Date   Asthma    Depression    History of chicken pox    Hypertension    Migraines    Pancreatitis 01/2018   Seizures (HCC)     PAST SURGICAL HISTORY: Past Surgical History:  Procedure Laterality Date   WISDOM TOOTH EXTRACTION      FAMILY HISTORY: Family History  Problem Relation Age of Onset   Diabetes Father    Arthritis Father    Alcohol abuse Father    Breast cancer Mother    Asthma Mother    Drug abuse Mother    Hypertension Mother     SOCIAL HISTORY: Social History   Socioeconomic History   Marital status: Single    Spouse name: Not on file   Number of children: 0   Years of education: 2 years college   Highest education level: Not on file  Occupational History   Occupation: Deli at [employer]  Tobacco Use   Smoking status: Former    Types: Cigarettes    Quit date: 01/21/2018    Years since quitting: 4.4   Smokeless tobacco: Never  Vaping Use   Vaping Use: Never used  Substance and Sexual Activity   Alcohol use: Yes    Alcohol/week: 9.0 standard drinks of alcohol    Types: 9 Shots of liquor per week    Comment: 02/2019 "  2 shots every 3 or 4 days "   Drug use: No   Sexual activity: Not Currently   Other Topics Concern   Not on file  Social History Narrative   Lives at home alone.   Right-handed.   No caffeine use.   Social Determinants of Health   Financial Resource Strain: Not on file  Food Insecurity: Not on file  Transportation Needs: Not on file  Physical Activity: Not on file  Stress: Not on file  Social Connections: Not on file  Intimate Partner Violence: Not on file    Levert Feinstein, M.D. Ph.D.  [facility] [address], [address] [place], [place] [address] Ph: [phone] Fax: [phone]  CC:  Orland Mustard, MD [address] [place],  [place] [address]  Allwardt, Crist Infante, PA-C

## 2022-12-24 ENCOUNTER — Ambulatory Visit: Payer: Managed Care, Other (non HMO) | Admitting: Neurology

## 2022-12-24 ENCOUNTER — Telehealth: Payer: Self-pay | Admitting: Neurology

## 2022-12-24 NOTE — Progress Notes (Deleted)
No chief complaint on file.   ASSESSMENT AND PLAN  Donna Short is a 32 y.o. 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 Hospital admission again in March 2022 for partial status epilepticus Recurrent seizure in October 2023  In the setting of alcohol abuse, noncompliance with her seizure medicine, now only taking Keppra 500 mg twice daily, reported feeling better with the lower dose, also less financial burden, does not want to go back to previous polypharmacy treatment,  No driving until seizure-free for 6 months, refilled her prescription,  Return to clinic in 6 months with nurse practitioner    DIAGNOSTIC DATA (LABS, IMAGING, TESTING) - I reviewed patient records, labs, notes, testing and imaging myself where available.  Video EEG monitoring March 1 to 5 2022.  This study initially showed frequent seizures (average 2 to 3/h) which gradually improved after adjusting AEDs. Total 12 seizures were noted between 1130 on 10/23/2020  to 10/24/2020 0924. Patient was noted to be confused during the seizures. Seizures are arising from left posterior quadrant, last seizure on 10/24/2020 at 0858, average duration of seizure about 3 minutes.  EEG also showed evidence of epileptogenicity arising from left posterior quadrant due to underlying structural abnormality.   Donna Short    I personally reviewed MRI of the brain with without contrast on October 22, 2020, in comparison with previous MRI, small region of cortical/subcortical T2/flair hyperintensity within the left parietal and occipital lobes, this findings were not present at previous MRI of October 25, 2019, primary consideration in this patient including early signal changes related to hypertensive encephalopathy/press versus subtle chronic encephalomalacia from previous episode, stable chronic microhemorrhage within the right frontal and parietal lobe, mild cerebral and cerebellar atrophy, advanced  for her age  Laboratory evaluation in March 2022, lacosamide level 7.2, phenobarbital 34, Dilantin 1.9, Keppra 35   HISTORICAL  Donna Short is here with her father, following for seizure, recent hospital discharge September 16 of May 31, 2020, she was in nonconvulsive status epilepticus, was put into burst suppression, following her acute pancreatitis  I saw her initially for seizure on December 19 2015.   On April 14th 2017,She had acute onset generalized tonic-clonic seizure at work in [employer], she woke up in the ambulance, 3 hours later, while at emergency room, she had her second generalized tonic-clonic seizure, she has been treated with Keppra 500 mg twice a day, tolerating medication well.   CT scan of the brain in April 2017, no intra-cranial abnormality, right parietal skull abrasion,   Laboratory evaluation showed mild anemia, with hemoglobin 11.8, normal CMP, negative pregnancy test.   She denies focal symptoms, she was developmentally normal, denier history of febrile seizure, there was no family history of epilepsy.   She works at [employer] for [employer]   EEG was normal in May 27th 2017.  She could not afford MRI brain.  While taking Keppra 500mg  bid,  Noticed irritability, anxiety, also complains work-related anxiety, we switched her to lamotrigine from Keppra  She has lost follow-up since last visit in May 2017, return to clinic again on February 12, 2018 she was supposed to take lamotrigine 100 mg twice a day, but not compliant with her medications, she was admitted to the hospital in July 2019 for acute pancreatitis, related to alcohol use, hospital course was also complicated by acute encephalopathy, possible alcohol withdrawal seizure, possible aspiration pneumonia, requiring intubation, during her hospital stay, there was reported seizure  activity,   CT head without contrast on Jan 18, 2018 was normal.   Discharge laboratory on January 28, 2018 showed  low potassium 3.3, normal creatinine 0.91,WBC remains elevated 12.9, anemia hemoglobin of 7.7, normal TSH, folic acid, B12 396,   CT abdomen on Jan 20, 2018 showed changes of acute pancreatitis, considerable peripancreatic fluid, and the free fluid in the abdomen, wall thickening within the colon, bilateral pleural effusion, lower lobe consolidation,   She  complains of increased fatigue, with documented blood pressure 94/68, heart rate of 97, she was discharged with prescription of labetalol 200 mg twice a day, hemoglobin of only 9.9, she was switched back to Keppra 500 mg twice daily during her hospital stay  She again lost to follow-up since June 2019,  I reviewed her most recent hospital discharge September 16 through May 31, 2020, acute on chronic pancreatitis, she presented with intractable nausea vomiting after binge drinking, during her hospital stay, had seizure on May 13, 2020, despite higher dose of Keppra, she becomes confused, acute metabolic encephalopathy, alcohol abuse PRES, acute respiratory failure, intubated for burst suppression on September 20, vocal cord swallowing, reintubation September 30, treated with dexamethasone, eventually require tracheostomy on October 4, staphylococcal tracheal aspiration, spiked fever of 105, patient had no recollection of the event, woke up 3 weeks later at the hospital,  I personally reviewed MRI of the brain September 20 and 24, 2021, patchy cortical/subcortical T2/flair hyperintensity involving bilateral frontal, parietal, temporal, occipital region, right worse than left frontal region, and the left temporal occipital region, mild patchy involvement of cerebellum  She was seen by epileptologist Dr. Melynda Ripple, continues EEG monitoring  showed epileptogenic activity arising from the left posterior temporal region, cortical dysfunction in the left hemisphere diffuse encephalopathy,  Reviewed multiple neuro hospitalist record, nonconvulsive  status epilepticus in the setting of posterior reversible encephalopathy syndrome, she was put on burst suppression with propofol, Versed, multiple antiepileptic medications, Keppra, Dilantin, Onfi, phenobarbital  I have advised Dr. Artis Flock to gradually tapering off Dilantin, keep current dose of Keppra 1000 mg twice a day, phenobarbital 100 mg daily  She was discharged with Keppra 1500 mg 12 hours, phenobarbital 64.8 mg twice a day, Dilantin 75 mg every 8 hours  I discussed with her primary care physician Dr. Artis Flock, on July 12 2020, decided to gradually taper off Dilantin, keep current dose of Keppra 1000 mg twice a day, phenobarbital 100 mg daily  Laboratory evaluation July 12, 2020: Dilantin level was 20.9, phenobarbital 29.7, B12 more than 2000, LDL 156, CMP was normal, glucose was 63, hemoglobin 10.7,  Lipase was normal 24 in October 2021  She is now 80% back to her baseline, lives alone, was brought in by her godfather today, she complains of side effect from polypharmacy treatment, dizziness, sleepiness, is in the process of weaning off Dilantin,  UPDATE November 01 2020: She had sudden loss of consciousness on October 19, 2020 at her work, [employer], next couple days she felt dizzy, lack of appetite, stayed in bed, during the virtual visit on October 22, 2020 with her primary care Dr. Orland Mustard, she was noted to be confused, was sent to the emergency room the same day, she was noted to be confused, abnormal MRI of the brain detailed above, was connected with video EEG monitoring, which showed subclinical seizure, was seen by Dr. Melynda Ripple, required continued titration of multiple and elective medications to resolve her nonconvulsive status epilepticus, was discharged home with polypharmacy, including Keppra 1500  mg twice a day, Dilantin 100 mg every 8 hours, phenobarbital 95 mg daily, Vimpat 100 mg twice a day, perampanel 4 mg daily  There was no clinical seizure  since hospital discharge, no confusion, she complains of excessive fatigue, can only go to sleep after medications, no energy for anything else  UPDATE November 22 2020: She is doing well, no recurrent seizure, she is now on Vimpat 100 mg twice a day, Keppra 750 mg twice a day, phenobarbital 97.2 mg daily  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 [employer], denies significant side effect from medication, but concerning about the medication cost,  UPDATE February 21 2021: Doing very well, taking lamotrigine 100 mg 2 tablets twice a day, Keppra 750 mg twice daily, there was no recurrent seizure-like spell, she was able to go back to her full-time job at [employer] department,  UPDATE Jun 19 2022: Lost follow-up since last visit in June 2022, reviewed hospital admission September 2022, for recurrent seizure, altered mental status, overnight video EEG confirmed epileptogenic focus in the left posterior temporal region, cortical dysfunction, MRI of the brain showed small focus of chronic encephalomalacia, microhemorrhage within the right frontal and parietal lobe,  She was discharged home with higher dose of Keppra 1000 mg twice a day, and lamotrigine 200 mg twice a day, add on Vimpat 100 mg twice a day, sertraline 100 mg for depression, continue to drink alcohol, slight elevation of alcohol level 11  She has stopped Vimpat and lamotrigine since Feb 2023, kept on Keppra 500mg  bid, but only take keppra 500mg  once since May 2023, eventually run out of medications.  She presented to emergency room May 28, 2022, rule out of the Keppra, had recurrent seizure on Oct 4th 2023,  l admission alcohol level 291 May 28, 2022  Personally reviewed repeat MRI of the brain in September 2022, motion degraded imaging, scattered chronic microhemorrhage within the posterior right frontal parietal region, no acute abnormality  She still works at [employer], some how, she is only take Keppra  500mg  bid  since Oct 4th 2023, feel better this way, also less financial burden  Update Dec 24, 2022 SS:   PHYSICAL EXAMNIATION: There were no vitals filed for this visit.   Gen: NAD, conversant, well nourised, well groomed                     Cardiovascular: Regular rate rhythm, no peripheral edema, warm, nontender. Eyes: Conjunctivae clear without exudates or hemorrhage Neck: Supple, no carotid bruits. Pulmonary: Clear to auscultation bilaterally   NEUROLOGICAL EXAM:  MENTAL STATUS: Speech/cognition: Awake alert oriented to history taking care of conversation   CRANIAL NERVES: CN II: Visual fields are full to confrontation. Pupils are round equal and briskly reactive to light. CN III, IV, VI: extraocular movement are normal. No ptosis. CN V: Facial sensation is intact to light touch CN VII: Face is symmetric with normal eye closure  CN VIII: Hearing is normal to causal conversation. CN IX, X: Phonation is normal. CN XI: Head turning and shoulder shrug are intact  MOTOR: There is no pronator drift of out-stretched arms. Muscle bulk and tone are normal. Muscle strength is normal.  REFLEXES: Reflexes are 2+ and symmetric at the biceps, triceps, knees, and ankles. Plantar responses are flexor.  SENSORY: Intact to light touch, pinprick and vibratory sensation are intact in fingers and toes.  COORDINATION: There is no trunk or limb dysmetria noted.  GAIT/STANCE: Posture is normal.Gait is normal.  REVIEW OF SYSTEMS: Full 14 system review of systems performed and notable only for as above All other review of systems were negative.  ALLERGIES: Allergies  Allergen Reactions   Claritin [Loratadine] Itching   Peanut-Containing Drug Products Other (See Comments)    Family not sure of reaction   Benadryl [Diphenhydramine Hcl (Sleep)] Rash   Latex Rash   Shrimp [Shellfish Allergy] Hives and Rash    HOME MEDICATIONS: Current Outpatient Medications  Medication Sig Dispense  Refill   acetaminophen (TYLENOL) 500 MG tablet Take 1,000 mg by mouth every 6 (six) hours as needed for moderate pain.     Albuterol Sulfate (PROAIR RESPICLICK) 108 (90 Base) MCG/ACT AEPB Inhale 2 puffs into the lungs daily as needed (wheezing).     ibuprofen (ADVIL) 800 MG tablet Take 800 mg by mouth 3 (three) times daily as needed for headache.     levETIRAcetam (KEPPRA) 500 MG tablet Take 1 tablet (500 mg total) by mouth 2 (two) times daily. 180 tablet 3   No current facility-administered medications for this visit.    PAST MEDICAL HISTORY: Past Medical History:  Diagnosis Date   Asthma    Depression    History of chicken pox    Hypertension    Migraines    Pancreatitis 01/2018   Seizures (HCC)     PAST SURGICAL HISTORY: Past Surgical History:  Procedure Laterality Date   WISDOM TOOTH EXTRACTION      FAMILY HISTORY: Family History  Problem Relation Age of Onset   Diabetes Father    Arthritis Father    Alcohol abuse Father    Breast cancer Mother    Asthma Mother    Drug abuse Mother    Hypertension Mother     SOCIAL HISTORY: Social History   Socioeconomic History   Marital status: Single    Spouse name: Not on file   Number of children: 0   Years of education: 2 years college   Highest education level: Not on file  Occupational History   Occupation: Deli at [employer]  Tobacco Use   Smoking status: Former    Types: Cigarettes    Quit date: 01/21/2018    Years since quitting: 4.9   Smokeless tobacco: Never  Vaping Use   Vaping Use: Never used  Substance and Sexual Activity   Alcohol use: Yes    Alcohol/week: 9.0 standard drinks of alcohol    Types: 9 Shots of liquor per week    Comment: 02/2019 "  2 shots every 3 or 4 days "   Drug use: No   Sexual activity: Not Currently  Other Topics Concern   Not on file  Social History Narrative   Lives at home alone.   Right-handed.   No caffeine use.   Social Determinants of Health   Financial  Resource Strain: Not on file  Food Insecurity: Not on file  Transportation Needs: Not on file  Physical Activity: Not on file  Stress: Not on file  Social Connections: Not on file  Intimate Partner Violence: Not on file    Margie Ege, Edrick Oh, DNP  [facility] [address], [address] [place], [place] [address] [phone]

## 2022-12-24 NOTE — Telephone Encounter (Signed)
Pt called and stated she can't make appointment today.

## 2023-04-10 ENCOUNTER — Telehealth: Payer: Self-pay | Admitting: Physician Assistant

## 2023-04-10 NOTE — Telephone Encounter (Signed)
Patient called back and has been scheduled for TOC from [facility] to [place] since this wasn't done in 2022. Patient is scheduled for 07/20/23.

## 2023-04-10 NOTE — Telephone Encounter (Signed)
 Called patient to schedule next available cpe. No answer left VM

## 2023-05-17 ENCOUNTER — Emergency Department (HOSPITAL_COMMUNITY)
Admission: EM | Admit: 2023-05-17 | Discharge: 2023-05-18 | Disposition: A | Discharge status: Home / Self Care | Payer: Managed Care, Other (non HMO) | Attending: Emergency Medicine | Admitting: Emergency Medicine

## 2023-05-17 DIAGNOSIS — J45909 Unspecified asthma, uncomplicated: Secondary | ICD-10-CM | POA: Diagnosis not present

## 2023-05-17 DIAGNOSIS — Z20822 Contact with and (suspected) exposure to covid-19: Secondary | ICD-10-CM | POA: Insufficient documentation

## 2023-05-17 DIAGNOSIS — Z91040 Latex allergy status: Secondary | ICD-10-CM | POA: Diagnosis not present

## 2023-05-17 DIAGNOSIS — R569 Unspecified convulsions: Secondary | ICD-10-CM | POA: Diagnosis present

## 2023-05-17 DIAGNOSIS — G40909 Epilepsy, unspecified, not intractable, without status epilepticus: Secondary | ICD-10-CM | POA: Diagnosis not present

## 2023-05-17 DIAGNOSIS — R519 Headache, unspecified: Secondary | ICD-10-CM | POA: Diagnosis not present

## 2023-05-17 DIAGNOSIS — I10 Essential (primary) hypertension: Secondary | ICD-10-CM | POA: Insufficient documentation

## 2023-05-17 DIAGNOSIS — Z760 Encounter for issue of repeat prescription: Secondary | ICD-10-CM | POA: Diagnosis not present

## 2023-05-17 DIAGNOSIS — Z91010 Allergy to peanuts: Secondary | ICD-10-CM | POA: Diagnosis not present

## 2023-05-17 NOTE — ED Triage Notes (Signed)
Patient BIB [facility] from home with complaints of 3 seizures today described as absence seizure is on Keppra hasn't missed any doses. Patient hypertensive 180/130 does have hx of same and not medicated. HR 80. CBG 74, Spo2-97%. Patient complains of headache across forehead for several days.

## 2023-05-18 ENCOUNTER — Emergency Department (HOSPITAL_COMMUNITY): Payer: Managed Care, Other (non HMO)

## 2023-05-18 ENCOUNTER — Encounter (HOSPITAL_COMMUNITY): Payer: Self-pay | Admitting: *Deleted

## 2023-05-18 ENCOUNTER — Other Ambulatory Visit: Payer: Self-pay

## 2023-05-18 LAB — COMPREHENSIVE METABOLIC PANEL
ALT: 49 U/L — ABNORMAL HIGH (ref 0–44)
AST: 81 U/L — ABNORMAL HIGH (ref 15–41)
Albumin: 3.2 g/dL — ABNORMAL LOW (ref 3.5–5.0)
Alkaline Phosphatase: 46 U/L (ref 38–126)
Anion gap: 18 — ABNORMAL HIGH (ref 5–15)
BUN: 16 mg/dL (ref 6–20)
CO2: 16 mmol/L — ABNORMAL LOW (ref 22–32)
Calcium: 8.9 mg/dL (ref 8.9–10.3)
Chloride: 104 mmol/L (ref 98–111)
Creatinine, Ser: 1.1 mg/dL — ABNORMAL HIGH (ref 0.44–1.00)
GFR, Estimated: >60 mL/min (ref >60)
Glucose, Bld: 96 mg/dL (ref 70–99)
Potassium: 3.6 mmol/L (ref 3.5–5.1)
Sodium: 138 mmol/L (ref 135–145)
Total Bilirubin: 0.3 mg/dL (ref 0.3–1.2)
Total Protein: 7.6 g/dL (ref 6.5–8.1)

## 2023-05-18 LAB — CBG MONITORING, ED: Glucose-Capillary: 93 mg/dL (ref 70–99)

## 2023-05-18 LAB — URINALYSIS, ROUTINE W REFLEX MICROSCOPIC
Bilirubin Urine: NEGATIVE
Glucose, UA: NEGATIVE mg/dL
Ketones, ur: 5 mg/dL — AB
Leukocytes,Ua: NEGATIVE
Nitrite: NEGATIVE
Protein, ur: >=300 mg/dL — AB
Specific Gravity, Urine: 1.019 (ref 1.005–1.030)
pH: 6 (ref 5.0–8.0)

## 2023-05-18 LAB — CBC
HCT: 36.1 % (ref 36.0–46.0)
Hemoglobin: 12 g/dL (ref 12.0–15.0)
MCH: 34.8 pg — ABNORMAL HIGH (ref 26.0–34.0)
MCHC: 33.2 g/dL (ref 30.0–36.0)
MCV: 104.6 fL — ABNORMAL HIGH (ref 80.0–100.0)
Platelets: 206 10*3/uL (ref 150–400)
RBC: 3.45 MIL/uL — ABNORMAL LOW (ref 3.87–5.11)
RDW: 12.9 % (ref 11.5–15.5)
WBC: 5.2 10*3/uL (ref 4.0–10.5)
nRBC: 0 % (ref 0.0–0.2)

## 2023-05-18 LAB — RESP PANEL BY RT-PCR (RSV, FLU A&B, COVID)  RVPGX2
Influenza A by PCR: NEGATIVE
Influenza B by PCR: NEGATIVE
Resp Syncytial Virus by PCR: NEGATIVE
SARS Coronavirus 2 by RT PCR: NEGATIVE

## 2023-05-18 LAB — HCG, SERUM, QUALITATIVE: Preg, Serum: NEGATIVE

## 2023-05-18 MED ORDER — SODIUM CHLORIDE 0.9 % IV SOLN: INTRAVENOUS | Status: DC

## 2023-05-18 MED ORDER — SODIUM CHLORIDE 0.9 % IV BOLUS
1000.0000 mL | Freq: Once | INTRAVENOUS | Status: AC
  Administered 2023-05-18: 1000 mL via INTRAVENOUS

## 2023-05-18 MED ORDER — LEVETIRACETAM IN NACL 1000 MG/100ML IV SOLN
1000.0000 mg | Freq: Once | INTRAVENOUS | Status: AC
  Administered 2023-05-18: 1000 mg via INTRAVENOUS
  Filled 2023-05-18: qty 100

## 2023-05-18 MED ORDER — DEXAMETHASONE SODIUM PHOSPHATE 10 MG/ML IJ SOLN
10.0000 mg | Freq: Once | INTRAMUSCULAR | Status: AC
  Administered 2023-05-18: 10 mg via INTRAVENOUS
  Filled 2023-05-18: qty 1

## 2023-05-18 MED ORDER — METOCLOPRAMIDE HCL 5 MG/ML IJ SOLN
10.0000 mg | Freq: Once | INTRAMUSCULAR | Status: AC
  Administered 2023-05-18: 10 mg via INTRAVENOUS
  Filled 2023-05-18: qty 2

## 2023-05-18 MED ORDER — LEVETIRACETAM 500 MG PO TABS: 500.0000 mg | ORAL_TABLET | Freq: Two times a day (BID) | ORAL | 3 refills | Status: AC

## 2023-05-18 MED ORDER — KETOROLAC TROMETHAMINE 15 MG/ML IJ SOLN
15.0000 mg | Freq: Once | INTRAMUSCULAR | Status: AC
  Administered 2023-05-18: 15 mg via INTRAVENOUS
  Filled 2023-05-18: qty 1

## 2023-05-18 NOTE — ED Notes (Signed)
Called lab regarding keppra level, they advised it is sent to [facility] for testing, not done in house.

## 2023-05-18 NOTE — ED Triage Notes (Signed)
The  pt reports that she has had 3 seizures today and that she has a migraine headache alsolmp sept 5th

## 2023-05-18 NOTE — ED Provider Notes (Signed)
[place] EMERGENCY DEPARTMENT AT [facility] Provider Note   CSN: [id number] Arrival date & time: 05/17/23  2252     History  Chief Complaint  Patient presents with   Seizures   Migraine   HPI Donna Short is a 32 y.o. female with history of seizures, hypertension, asthma, PRES presenting for seizure and headache.  States that yesterday she had 3 seizures and has had a headache since Friday.  Patient does take Keppra and has not missed any doses.  States that 2 days ago she did drink a considerable amount of alcohol as she had 2 cousins that died unexpectedly.  Also endorses subjective fever at home.  Headache came on gradually but denies associated visual disturbance or nuchal rigidity.  Patient also requesting refill of her Keppra.   Seizures Migraine       Home Medications Prior to Admission medications   Medication Sig Start Date End Date Taking? Authorizing Provider  acetaminophen (TYLENOL) 500 MG tablet Take 1,000 mg by mouth every 6 (six) hours as needed for moderate pain.    [provider]  Albuterol Sulfate (PROAIR RESPICLICK) 108 (90 Base) MCG/ACT AEPB Inhale 2 puffs into the lungs daily as needed (wheezing).    [provider]  ibuprofen (ADVIL) 800 MG tablet Take 800 mg by mouth 3 (three) times daily as needed for headache. 04/08/22   [provider]  levETIRAcetam (KEPPRA) 500 MG tablet Take 1 tablet (500 mg total) by mouth 2 (two) times daily. 05/18/23   Gareth Eagle, PA-C      Allergies    Claritin [loratadine], Peanut-containing drug products, Benadryl [diphenhydramine hcl (sleep)], Latex, and Shrimp [shellfish allergy]    Review of Systems   Review of Systems  Neurological:  Positive for seizures.    Physical Exam Updated Vital Signs BP (!) 153/97   Pulse 98   Temp 99.1 F (37.3 C)   Resp 17   Ht 5\' 2"  (1.575 m)   Wt 58.7 kg   LMP 04/30/2023   SpO2 99%   BMI 23.67 kg/m  Physical Exam Vitals and  nursing note reviewed.  HENT:     Head: Normocephalic and atraumatic.     Comments: No tongue biting noted    Mouth/Throat:     Mouth: Mucous membranes are moist.  Eyes:     General:        Right eye: No discharge.        Left eye: No discharge.     Conjunctiva/sclera: Conjunctivae normal.  Cardiovascular:     Rate and Rhythm: Normal rate and regular rhythm.     Pulses: Normal pulses.     Heart sounds: Normal heart sounds.  Pulmonary:     Effort: Pulmonary effort is normal.     Breath sounds: Normal breath sounds.  Abdominal:     General: Abdomen is flat.     Palpations: Abdomen is soft.  Skin:    General: Skin is warm and dry.  Neurological:     General: No focal deficit present.     Comments: GCS 15. Speech is goal oriented. No deficits appreciated to CN III-XII; symmetric eyebrow raise, no facial drooping, tongue midline. Patient has equal grip strength bilaterally with 5/5 strength against resistance in all major muscle groups bilaterally. Sensation to light touch intact. Patient moves extremities without ataxia. Normal finger-nose-finger. Patient ambulatory with steady gait.  Psychiatric:        Mood and Affect: Mood normal.  ED Results / Procedures / Treatments   Labs (all labs ordered are listed, but only abnormal results are displayed) Labs Reviewed  CBC - Abnormal; Notable for the following components:      Result Value   RBC 3.45 (*)    MCV 104.6 (*)    MCH 34.8 (*)    All other components within normal limits  COMPREHENSIVE METABOLIC PANEL - Abnormal; Notable for the following components:   CO2 16 (*)    Creatinine, Ser 1.10 (*)    Albumin 3.2 (*)    AST 81 (*)    ALT 49 (*)    Anion gap 18 (*)    All other components within normal limits  URINALYSIS, ROUTINE W REFLEX MICROSCOPIC - Abnormal; Notable for the following components:   Hgb urine dipstick SMALL (*)    Ketones, ur 5 (*)    Protein, ur >=300 (*)    Bacteria, UA RARE (*)    All other  components within normal limits  RESP PANEL BY RT-PCR (RSV, FLU A&B, COVID)  RVPGX2  HCG, SERUM, QUALITATIVE  LEVETIRACETAM LEVEL  CBG MONITORING, ED    EKG EKG Interpretation Date/Time:  Sunday May 17 2023 23:01:14 EDT Ventricular Rate:  123 PR Interval:  128 QRS Duration:  74 QT Interval:  322 QTC Calculation: 460 R Axis:   40  Text Interpretation: Sinus tachycardia with occasional Premature ventricular complexes Cannot rule out Anterior infarct , age undetermined ST & T wave abnormality, consider inferolateral ischemia Confirmed by Nicanor Alcon, April ([id number]) on 05/18/2023 8:53:45 AM  Radiology CT HEAD WO CONTRAST  Result Date: 05/18/2023 CLINICAL DATA:  Headache, increasing frequency or severity recent seizure activity, hypertension EXAM: CT HEAD WITHOUT CONTRAST TECHNIQUE: Contiguous axial images were obtained from the base of the skull through the vertex without intravenous contrast. RADIATION DOSE REDUCTION: This exam was performed according to the departmental dose-optimization program which includes automated exposure control, adjustment of the mA and/or kV according to patient size and/or use of iterative reconstruction technique. COMPARISON:  05/03/2021 FINDINGS: Brain: No evidence of acute infarction, hemorrhage, hydrocephalus, extra-axial collection or mass lesion/mass effect. Vascular: No hyperdense vessel or unexpected calcification. Skull: Normal. Negative for fracture or focal lesion. Sinuses/Orbits: No acute finding. Other: None. IMPRESSION: No acute intracranial abnormality by noncontrast CT. Electronically Signed   By: Judie Petit.  Shick M.D.   On: 05/18/2023 08:15    Procedures Procedures    Medications Ordered in ED Medications  sodium chloride 0.9 % bolus 1,000 mL (0 mLs Intravenous Stopped 05/18/23 0931)    And  0.9 %  sodium chloride infusion ( Intravenous New Bag/Given 05/18/23 0855)  levETIRAcetam (KEPPRA) IVPB 1000 mg/100 mL premix (0 mg Intravenous Stopped 05/18/23  0822)  ketorolac (TORADOL) 15 MG/ML injection 15 mg (15 mg Intravenous Given 05/18/23 0751)  metoCLOPramide (REGLAN) injection 10 mg (10 mg Intravenous Given 05/18/23 0750)  dexamethasone (DECADRON) injection 10 mg (10 mg Intravenous Given 05/18/23 0751)    ED Course/ Medical Decision Making/ A&P                                 Medical Decision Making Amount and/or Complexity of Data Reviewed Labs: ordered. Radiology: ordered.  Risk Prescription drug management.   Initial Impression and Ddx 32 year old well-appearing female presenting for seizures and headache.  DDx includes breakthrough versus alcohol induced seizure, infection, arrhythmia, brain mass, stroke, electrolyte derangement, migraine, meningitis. Patient PMH that increases complexity of  ED encounter:  history of seizures, hypertension, asthma, PRES  Interpretation of Diagnostics - I independent reviewed and interpreted the labs as followed: hematuria  - I independently visualized the following imaging with scope of interpretation limited to determining acute life threatening conditions related to emergency care: CT head, which revealed no acute findings  -I personally reviewed and interpreted EKG which revealed  Patient Reassessment and Ultimate Disposition/Management Loaded with Keppra and treated with headache cocktail.  Patient remained clinically well with no evidence of seizure-like or seizure activity for several hours.  Also stated that her headache was improved.  Suspect that alcohol consumption couple days ago may have precipitated seizures at home but cannot definitively rule out breakthrough seizure.  Workup not suggestive of infection at this time.  Sent refills of her Keppra to her pharmacy.  Patient does have close follow-up with [facility] neurology.  Advised her to follow-up with her neurologist.  Discussed return precautions.  Advised her also to follow-up on the Keppra level that was sent.  Also discussed  preventative measures at home with regard to seizures.  Doubt stroke given no focal neurodeficits and reassuring CT scan.  Also doubt meningitis given lack of meningeal signs, no fever and reassuring CT.  Patient management required discussion with the following services or consulting groups:  None  Complexity of Problems Addressed Acute complicated illness or Injury  Additional Data Reviewed and Analyzed Further history obtained from: Past medical history and medications listed in the EMR, Prior ED visit notes, and Recent discharge summary  Patient Encounter Risk Assessment Prescriptions         Final Clinical Impression(s) / ED Diagnoses Final diagnoses:  Seizures (HCC)  Nonintractable headache, unspecified chronicity pattern, unspecified headache type    Rx / DC Orders ED Discharge Orders          Ordered    levETIRAcetam (KEPPRA) 500 MG tablet  2 times daily        05/18/23 1243              Gareth Eagle, PA-C 05/18/23 1245    Edwin Dada P, DO 06/03/23 0945

## 2023-05-18 NOTE — Discharge Instructions (Signed)
Evaluation today was overall reassuring.  Sent Keppra refills to your pharmacy.  Advise you follow-up with your neurologist.  If you have another seizure, have follow-up worsening headache, facial droop, slurred speech, numbness or weakness in your extremities, or any other concerning symptom please return emerged department further evaluation.

## 2023-05-19 LAB — LEVETIRACETAM LEVEL: Levetiracetam Lvl: 9.5 ug/mL — ABNORMAL LOW (ref 10.0–40.0)

## 2023-07-07 ENCOUNTER — Encounter: Payer: Self-pay | Admitting: Neurology

## 2023-07-07 ENCOUNTER — Ambulatory Visit: Payer: Managed Care, Other (non HMO) | Admitting: Neurology

## 2023-07-20 ENCOUNTER — Ambulatory Visit: Payer: Managed Care, Other (non HMO) | Admitting: Physician Assistant
